# Patient Record
Sex: Female | Born: 1965
Health system: Southern US, Community
[De-identification: ages and names within clinical notes are randomized; demographics above are authoritative.]

## PROBLEM LIST (undated history)

## (undated) ENCOUNTER — Ambulatory Visit: Payer: MEDICARE

## (undated) ENCOUNTER — Encounter

## (undated) ENCOUNTER — Ambulatory Visit

## (undated) ENCOUNTER — Telehealth

## (undated) ENCOUNTER — Encounter: Payer: MEDICARE | Attending: Internal Medicine | Primary: Internal Medicine

## (undated) ENCOUNTER — Ambulatory Visit: Attending: Otolaryngology | Primary: Otolaryngology

## (undated) DIAGNOSIS — M545 Low back pain, unspecified: Secondary | ICD-10-CM

## (undated) DIAGNOSIS — M1711 Unilateral primary osteoarthritis, right knee: Secondary | ICD-10-CM

## (undated) DIAGNOSIS — G5600 Carpal tunnel syndrome, unspecified upper limb: Secondary | ICD-10-CM

## (undated) DIAGNOSIS — B351 Tinea unguium: Secondary | ICD-10-CM

## (undated) DIAGNOSIS — I493 Ventricular premature depolarization: Secondary | ICD-10-CM

## (undated) DIAGNOSIS — Z1211 Encounter for screening for malignant neoplasm of colon: Secondary | ICD-10-CM

## (undated) DIAGNOSIS — F32A Depression, unspecified: Secondary | ICD-10-CM

## (undated) DIAGNOSIS — Z87898 Personal history of other specified conditions: Secondary | ICD-10-CM

## (undated) DIAGNOSIS — R569 Unspecified convulsions: Secondary | ICD-10-CM

## (undated) DIAGNOSIS — N3281 Overactive bladder: Secondary | ICD-10-CM

## (undated) DIAGNOSIS — J45909 Unspecified asthma, uncomplicated: Secondary | ICD-10-CM

## (undated) DIAGNOSIS — J309 Allergic rhinitis, unspecified: Secondary | ICD-10-CM

## (undated) DIAGNOSIS — F419 Anxiety disorder, unspecified: Secondary | ICD-10-CM

## (undated) DIAGNOSIS — K8020 Calculus of gallbladder without cholecystitis without obstruction: Secondary | ICD-10-CM

## (undated) DIAGNOSIS — E119 Type 2 diabetes mellitus without complications: Secondary | ICD-10-CM

## (undated) DIAGNOSIS — K219 Gastro-esophageal reflux disease without esophagitis: Secondary | ICD-10-CM

## (undated) DIAGNOSIS — G8929 Other chronic pain: Secondary | ICD-10-CM

## (undated) DIAGNOSIS — F319 Bipolar disorder, unspecified: Secondary | ICD-10-CM

## (undated) DIAGNOSIS — Z9989 Dependence on other enabling machines and devices: Secondary | ICD-10-CM

## (undated) DIAGNOSIS — I10 Essential (primary) hypertension: Secondary | ICD-10-CM

## (undated) DIAGNOSIS — S91311A Laceration without foreign body, right foot, initial encounter: Secondary | ICD-10-CM

## (undated) DIAGNOSIS — R7303 Prediabetes: Secondary | ICD-10-CM

## (undated) DIAGNOSIS — G4733 Obstructive sleep apnea (adult) (pediatric): Secondary | ICD-10-CM

## (undated) DIAGNOSIS — F329 Major depressive disorder, single episode, unspecified: Secondary | ICD-10-CM

## (undated) HISTORY — DX: Low back pain, unspecified: M54.50

## (undated) HISTORY — DX: Calculus of gallbladder without cholecystitis without obstruction: K80.20

## (undated) HISTORY — PX: OTHER SURGICAL HISTORY: SHX169

## (undated) HISTORY — DX: Laceration without foreign body, right foot, initial encounter: S91.311A

## (undated) HISTORY — DX: Obstructive sleep apnea (adult) (pediatric): G47.33

## (undated) HISTORY — PX: CARPAL TUNNEL RELEASE: SHX101

## (undated) HISTORY — DX: Unspecified convulsions: R56.9

## (undated) HISTORY — DX: Tinea unguium: B35.1

## (undated) HISTORY — DX: Personal history of other specified conditions: Z87.898

## (undated) HISTORY — DX: Other chronic pain: G89.29

## (undated) HISTORY — DX: Carpal tunnel syndrome, unspecified upper limb: G56.00

## (undated) HISTORY — DX: Ventricular premature depolarization: I49.3

## (undated) HISTORY — DX: Overactive bladder: N32.81

## (undated) HISTORY — DX: Low back pain: M54.5

## (undated) HISTORY — DX: Unilateral primary osteoarthritis, right knee: M17.11

## (undated) HISTORY — DX: Encounter for screening for malignant neoplasm of colon: Z12.11

## (undated) HISTORY — PX: ABDOMINAL HYSTERECTOMY: SHX81

## (undated) HISTORY — PX: LEEP: SHX91

## (undated) HISTORY — PX: FOOT SURGERY: SHX648

## (undated) HISTORY — PX: TUBAL LIGATION: SHX77

## (undated) HISTORY — DX: Obstructive sleep apnea (adult) (pediatric): Z99.89

## (undated) HISTORY — PX: DILATION AND CURETTAGE OF UTERUS: SHX78

---

## 2004-03-27 ENCOUNTER — Other Ambulatory Visit: Payer: Self-pay

## 2005-07-28 ENCOUNTER — Ambulatory Visit: Payer: Self-pay | Admitting: Unknown Physician Specialty

## 2005-08-05 ENCOUNTER — Ambulatory Visit: Payer: Self-pay | Admitting: Unknown Physician Specialty

## 2005-08-15 ENCOUNTER — Emergency Department: Payer: Self-pay | Admitting: Emergency Medicine

## 2005-12-07 ENCOUNTER — Emergency Department: Payer: Self-pay | Admitting: Emergency Medicine

## 2006-02-19 ENCOUNTER — Emergency Department: Payer: Self-pay | Admitting: Unknown Physician Specialty

## 2006-05-01 ENCOUNTER — Ambulatory Visit (HOSPITAL_COMMUNITY): Admission: RE | Admit: 2006-05-01 | Discharge: 2006-05-01 | Payer: Self-pay | Admitting: Gastroenterology

## 2006-05-30 ENCOUNTER — Ambulatory Visit: Payer: Self-pay

## 2006-10-30 ENCOUNTER — Emergency Department: Payer: Self-pay | Admitting: General Practice

## 2007-05-21 ENCOUNTER — Ambulatory Visit: Payer: Self-pay | Admitting: Emergency Medicine

## 2007-05-23 ENCOUNTER — Emergency Department: Payer: Self-pay | Admitting: Emergency Medicine

## 2007-07-26 ENCOUNTER — Ambulatory Visit: Payer: Self-pay | Admitting: Unknown Physician Specialty

## 2007-08-03 ENCOUNTER — Ambulatory Visit: Payer: Self-pay | Admitting: Unknown Physician Specialty

## 2007-11-18 ENCOUNTER — Ambulatory Visit: Payer: Self-pay | Admitting: Family Medicine

## 2008-06-05 ENCOUNTER — Emergency Department: Payer: Self-pay | Admitting: Emergency Medicine

## 2008-08-23 ENCOUNTER — Emergency Department: Payer: Self-pay | Admitting: Emergency Medicine

## 2008-08-29 ENCOUNTER — Emergency Department: Payer: Self-pay | Admitting: Emergency Medicine

## 2009-03-08 ENCOUNTER — Observation Stay: Payer: Self-pay | Admitting: Obstetrics and Gynecology

## 2009-04-25 ENCOUNTER — Emergency Department: Payer: Self-pay | Admitting: Emergency Medicine

## 2009-05-09 ENCOUNTER — Ambulatory Visit: Payer: Self-pay | Admitting: Family Medicine

## 2009-07-20 ENCOUNTER — Emergency Department: Payer: Self-pay | Admitting: Emergency Medicine

## 2009-09-10 ENCOUNTER — Emergency Department: Payer: Self-pay | Admitting: Emergency Medicine

## 2009-12-10 ENCOUNTER — Ambulatory Visit: Payer: Self-pay | Admitting: Internal Medicine

## 2010-02-11 ENCOUNTER — Emergency Department: Payer: Self-pay | Admitting: Emergency Medicine

## 2010-03-27 ENCOUNTER — Ambulatory Visit: Payer: Self-pay | Admitting: Orthopedic Surgery

## 2010-03-28 ENCOUNTER — Ambulatory Visit: Payer: Self-pay | Admitting: Orthopedic Surgery

## 2010-04-09 ENCOUNTER — Emergency Department: Payer: Self-pay | Admitting: Unknown Physician Specialty

## 2010-04-22 ENCOUNTER — Emergency Department: Payer: Self-pay | Admitting: Emergency Medicine

## 2010-05-03 ENCOUNTER — Emergency Department: Payer: Self-pay | Admitting: Emergency Medicine

## 2010-05-12 ENCOUNTER — Ambulatory Visit: Payer: Self-pay | Admitting: Family Medicine

## 2010-06-12 ENCOUNTER — Emergency Department: Payer: Self-pay | Admitting: Emergency Medicine

## 2010-07-20 ENCOUNTER — Emergency Department: Payer: Self-pay | Admitting: Emergency Medicine

## 2010-09-09 ENCOUNTER — Emergency Department: Payer: Self-pay | Admitting: Emergency Medicine

## 2010-10-01 ENCOUNTER — Emergency Department: Payer: Self-pay | Admitting: Emergency Medicine

## 2010-10-10 ENCOUNTER — Ambulatory Visit: Payer: Self-pay | Admitting: Orthopedic Surgery

## 2010-11-05 ENCOUNTER — Ambulatory Visit: Payer: Self-pay | Admitting: Pain Medicine

## 2010-11-27 ENCOUNTER — Ambulatory Visit: Payer: Self-pay | Admitting: Pain Medicine

## 2010-12-17 ENCOUNTER — Ambulatory Visit: Payer: Self-pay | Admitting: Family Medicine

## 2010-12-23 ENCOUNTER — Emergency Department: Payer: Self-pay | Admitting: Emergency Medicine

## 2010-12-26 ENCOUNTER — Ambulatory Visit: Payer: Self-pay | Admitting: Pain Medicine

## 2011-01-08 ENCOUNTER — Emergency Department: Payer: Self-pay | Admitting: Emergency Medicine

## 2011-01-20 ENCOUNTER — Ambulatory Visit: Payer: Self-pay | Admitting: Pain Medicine

## 2011-01-22 ENCOUNTER — Emergency Department: Payer: Self-pay | Admitting: Emergency Medicine

## 2011-02-04 ENCOUNTER — Ambulatory Visit: Payer: Self-pay | Admitting: Family Medicine

## 2011-02-13 ENCOUNTER — Emergency Department: Payer: Self-pay | Admitting: Emergency Medicine

## 2011-04-14 ENCOUNTER — Emergency Department: Payer: Self-pay | Admitting: Emergency Medicine

## 2011-04-15 ENCOUNTER — Ambulatory Visit: Payer: Self-pay | Admitting: Unknown Physician Specialty

## 2011-04-18 ENCOUNTER — Ambulatory Visit: Payer: Self-pay | Admitting: Unknown Physician Specialty

## 2011-04-22 LAB — PATHOLOGY REPORT

## 2011-06-24 ENCOUNTER — Emergency Department: Payer: Self-pay | Admitting: Emergency Medicine

## 2011-07-07 ENCOUNTER — Ambulatory Visit: Payer: Self-pay | Admitting: Unknown Physician Specialty

## 2011-07-08 HISTORY — PX: ABDOMINAL HYSTERECTOMY: SUR658

## 2011-07-17 ENCOUNTER — Ambulatory Visit: Payer: Self-pay | Admitting: Unknown Physician Specialty

## 2011-07-21 LAB — PATHOLOGY REPORT

## 2011-08-05 ENCOUNTER — Emergency Department: Payer: Self-pay | Admitting: Emergency Medicine

## 2011-08-15 ENCOUNTER — Emergency Department: Payer: Self-pay | Admitting: Emergency Medicine

## 2011-08-25 ENCOUNTER — Ambulatory Visit: Payer: Self-pay

## 2011-08-25 ENCOUNTER — Emergency Department: Payer: Self-pay | Admitting: Emergency Medicine

## 2011-08-26 ENCOUNTER — Emergency Department: Payer: Self-pay | Admitting: Emergency Medicine

## 2011-11-01 ENCOUNTER — Emergency Department: Payer: Self-pay | Admitting: Emergency Medicine

## 2012-01-03 ENCOUNTER — Ambulatory Visit: Payer: Self-pay | Admitting: Family Medicine

## 2012-01-03 LAB — RAPID INFLUENZA A&B ANTIGENS

## 2012-01-30 ENCOUNTER — Ambulatory Visit: Payer: Self-pay | Admitting: Internal Medicine

## 2012-03-18 ENCOUNTER — Emergency Department: Payer: Self-pay | Admitting: Emergency Medicine

## 2012-05-15 ENCOUNTER — Emergency Department: Payer: Self-pay | Admitting: Emergency Medicine

## 2012-06-09 ENCOUNTER — Ambulatory Visit: Payer: Self-pay | Admitting: Family Medicine

## 2012-08-22 ENCOUNTER — Emergency Department: Payer: Self-pay | Admitting: Emergency Medicine

## 2012-08-22 LAB — CBC
HCT: 36.3 % (ref 35.0–47.0)
HGB: 12.7 g/dL (ref 12.0–16.0)
MCV: 82 fL (ref 80–100)
Platelet: 194 10*3/uL (ref 150–440)
RBC: 4.43 10*6/uL (ref 3.80–5.20)
RDW: 14.2 % (ref 11.5–14.5)
WBC: 6 10*3/uL (ref 3.6–11.0)

## 2012-08-22 LAB — COMPREHENSIVE METABOLIC PANEL
Albumin: 3.9 g/dL (ref 3.4–5.0)
Alkaline Phosphatase: 87 U/L (ref 50–136)
Anion Gap: 5 — ABNORMAL LOW (ref 7–16)
Creatinine: 0.83 mg/dL (ref 0.60–1.30)
Osmolality: 276 (ref 275–301)
Potassium: 3.6 mmol/L (ref 3.5–5.1)
SGPT (ALT): 25 U/L (ref 12–78)
Sodium: 140 mmol/L (ref 136–145)
Total Protein: 7.5 g/dL (ref 6.4–8.2)

## 2012-08-22 LAB — CK TOTAL AND CKMB (NOT AT ARMC): CK, Total: 153 U/L (ref 21–215)

## 2013-03-02 ENCOUNTER — Ambulatory Visit: Payer: Self-pay

## 2013-03-29 ENCOUNTER — Emergency Department: Payer: Self-pay | Admitting: Unknown Physician Specialty

## 2013-04-04 ENCOUNTER — Emergency Department: Payer: Self-pay | Admitting: Emergency Medicine

## 2013-04-04 ENCOUNTER — Ambulatory Visit: Payer: Self-pay

## 2013-04-04 LAB — URINALYSIS, COMPLETE
Blood: NEGATIVE
Glucose,UR: NEGATIVE mg/dL (ref 0–75)
Leukocyte Esterase: NEGATIVE
Nitrite: NEGATIVE
Ph: 7 (ref 4.5–8.0)
RBC,UR: NONE SEEN /HPF (ref 0–5)
Specific Gravity: 1.011 (ref 1.003–1.030)
Squamous Epithelial: 1
WBC UR: <1 /HPF (ref 0–5)

## 2013-04-04 LAB — COMPREHENSIVE METABOLIC PANEL
Anion Gap: 6 — ABNORMAL LOW (ref 7–16)
BUN: 9 mg/dL (ref 7–18)
Calcium, Total: 8.9 mg/dL (ref 8.5–10.1)
Chloride: 104 mmol/L (ref 98–107)
Creatinine: 0.87 mg/dL (ref 0.60–1.30)
EGFR (African American): >60
Glucose: 97 mg/dL (ref 65–99)
Osmolality: 272 (ref 275–301)
SGPT (ALT): 34 U/L (ref 12–78)
Total Protein: 7.4 g/dL (ref 6.4–8.2)

## 2013-04-04 LAB — CBC WITH DIFFERENTIAL/PLATELET
Basophil #: 0 10*3/uL (ref 0.0–0.1)
Eosinophil #: 0 10*3/uL (ref 0.0–0.7)
Eosinophil %: 0.4 %
HCT: 35.1 % (ref 35.0–47.0)
HGB: 12.6 g/dL (ref 12.0–16.0)
Lymphocyte #: 1.2 10*3/uL (ref 1.0–3.6)
MCH: 28.6 pg (ref 26.0–34.0)
MCHC: 35.8 g/dL (ref 32.0–36.0)
MCV: 80 fL (ref 80–100)
Monocyte #: 0.4 x10 3/mm (ref 0.2–0.9)
Neutrophil %: 82.8 %
Platelet: 202 10*3/uL (ref 150–440)
RBC: 4.39 10*6/uL (ref 3.80–5.20)
RDW: 14.8 % — ABNORMAL HIGH (ref 11.5–14.5)

## 2013-04-04 LAB — DRUG SCREEN, URINE
Amphetamines, Ur Screen: NEGATIVE (ref ?–1000)
Benzodiazepine, Ur Scrn: NEGATIVE (ref ?–200)
Cannabinoid 50 Ng, Ur ~~LOC~~: NEGATIVE (ref ?–50)
Cocaine Metabolite,Ur ~~LOC~~: NEGATIVE (ref ?–300)
MDMA (Ecstasy)Ur Screen: NEGATIVE (ref ?–500)
Phencyclidine (PCP) Ur S: NEGATIVE (ref ?–25)
Tricyclic, Ur Screen: NEGATIVE (ref ?–1000)

## 2013-04-04 LAB — CK TOTAL AND CKMB (NOT AT ARMC)
CK, Total: 102 U/L (ref 21–215)
CK-MB: <0.5 ng/mL — ABNORMAL LOW (ref 0.5–3.6)

## 2013-04-04 LAB — TROPONIN I: Troponin-I: <0.02 ng/mL

## 2014-05-02 ENCOUNTER — Ambulatory Visit: Payer: Self-pay | Admitting: Family Medicine

## 2014-05-02 LAB — URINALYSIS, COMPLETE
Bilirubin,UR: NEGATIVE
Blood: NEGATIVE
Glucose,UR: NEGATIVE mg/dL (ref 0–75)
Ketone: NEGATIVE
LEUKOCYTE ESTERASE: NEGATIVE
NITRITE: NEGATIVE
PH: 7.5 (ref 4.5–8.0)
Protein: NEGATIVE
Specific Gravity: 1.01 (ref 1.003–1.030)

## 2014-05-05 LAB — URINE CULTURE

## 2014-06-28 ENCOUNTER — Ambulatory Visit: Payer: Self-pay | Admitting: Family Medicine

## 2014-07-16 ENCOUNTER — Ambulatory Visit: Payer: Self-pay | Admitting: Family Medicine

## 2014-09-09 ENCOUNTER — Ambulatory Visit: Payer: Self-pay | Admitting: Emergency Medicine

## 2014-09-09 LAB — RAPID INFLUENZA A&B ANTIGENS (ARMC ONLY)

## 2014-09-17 ENCOUNTER — Emergency Department: Payer: Self-pay | Admitting: Emergency Medicine

## 2014-09-17 LAB — URINALYSIS, COMPLETE
BILIRUBIN, UR: NEGATIVE
BLOOD: NEGATIVE
GLUCOSE, UR: NEGATIVE mg/dL (ref 0–75)
Ketone: NEGATIVE
Leukocyte Esterase: NEGATIVE
Nitrite: NEGATIVE
Ph: 6 (ref 4.5–8.0)
Protein: NEGATIVE
RBC,UR: <1 /HPF (ref 0–5)
SPECIFIC GRAVITY: 1.003 (ref 1.003–1.030)
WBC UR: <1 /HPF (ref 0–5)

## 2014-09-17 LAB — CBC WITH DIFFERENTIAL/PLATELET
Basophil #: 0.1 10*3/uL (ref 0.0–0.1)
Basophil %: 0.7 %
EOS ABS: 0.5 10*3/uL (ref 0.0–0.7)
Eosinophil %: 3.8 %
HCT: 39 % (ref 35.0–47.0)
HGB: 13 g/dL (ref 12.0–16.0)
LYMPHS ABS: 3 10*3/uL (ref 1.0–3.6)
Lymphocyte %: 21.4 %
MCH: 27.6 pg (ref 26.0–34.0)
MCHC: 33.4 g/dL (ref 32.0–36.0)
MCV: 83 fL (ref 80–100)
MONOS PCT: 9.5 %
Monocyte #: 1.3 x10 3/mm — ABNORMAL HIGH (ref 0.2–0.9)
NEUTROS ABS: 9 10*3/uL — AB (ref 1.4–6.5)
NEUTROS PCT: 64.6 %
Platelet: 265 10*3/uL (ref 150–440)
RBC: 4.72 10*6/uL (ref 3.80–5.20)
RDW: 14.3 % (ref 11.5–14.5)
WBC: 13.9 10*3/uL — ABNORMAL HIGH (ref 3.6–11.0)

## 2014-09-17 LAB — BASIC METABOLIC PANEL
Anion Gap: 7 (ref 7–16)
BUN: 9 mg/dL (ref 7–18)
CO2: 28 mmol/L (ref 21–32)
CREATININE: 0.94 mg/dL (ref 0.60–1.30)
Calcium, Total: 9.2 mg/dL (ref 8.5–10.1)
Chloride: 103 mmol/L (ref 98–107)
EGFR (African American): >60
Glucose: 105 mg/dL — ABNORMAL HIGH (ref 65–99)
Osmolality: 275 (ref 275–301)
POTASSIUM: 4 mmol/L (ref 3.5–5.1)
Sodium: 138 mmol/L (ref 136–145)

## 2014-11-07 DIAGNOSIS — M5441 Lumbago with sciatica, right side: Secondary | ICD-10-CM | POA: Diagnosis not present

## 2014-11-07 DIAGNOSIS — M545 Low back pain: Secondary | ICD-10-CM | POA: Diagnosis not present

## 2014-11-10 DIAGNOSIS — M5432 Sciatica, left side: Secondary | ICD-10-CM | POA: Diagnosis not present

## 2014-11-10 DIAGNOSIS — M545 Low back pain: Secondary | ICD-10-CM | POA: Diagnosis not present

## 2014-11-10 DIAGNOSIS — M6281 Muscle weakness (generalized): Secondary | ICD-10-CM | POA: Diagnosis not present

## 2014-11-10 DIAGNOSIS — M5431 Sciatica, right side: Secondary | ICD-10-CM | POA: Diagnosis not present

## 2014-11-15 DIAGNOSIS — M5432 Sciatica, left side: Secondary | ICD-10-CM | POA: Diagnosis not present

## 2014-11-15 DIAGNOSIS — M5431 Sciatica, right side: Secondary | ICD-10-CM | POA: Diagnosis not present

## 2014-11-15 DIAGNOSIS — M6281 Muscle weakness (generalized): Secondary | ICD-10-CM | POA: Diagnosis not present

## 2014-11-15 DIAGNOSIS — M545 Low back pain: Secondary | ICD-10-CM | POA: Diagnosis not present

## 2014-11-17 DIAGNOSIS — M545 Low back pain: Secondary | ICD-10-CM | POA: Diagnosis not present

## 2014-11-17 DIAGNOSIS — M5431 Sciatica, right side: Secondary | ICD-10-CM | POA: Diagnosis not present

## 2014-11-17 DIAGNOSIS — M6281 Muscle weakness (generalized): Secondary | ICD-10-CM | POA: Diagnosis not present

## 2014-11-17 DIAGNOSIS — M5432 Sciatica, left side: Secondary | ICD-10-CM | POA: Diagnosis not present

## 2014-11-22 DIAGNOSIS — M6281 Muscle weakness (generalized): Secondary | ICD-10-CM | POA: Diagnosis not present

## 2014-11-22 DIAGNOSIS — M545 Low back pain: Secondary | ICD-10-CM | POA: Diagnosis not present

## 2014-11-22 DIAGNOSIS — M5432 Sciatica, left side: Secondary | ICD-10-CM | POA: Diagnosis not present

## 2014-11-22 DIAGNOSIS — M5431 Sciatica, right side: Secondary | ICD-10-CM | POA: Diagnosis not present

## 2014-11-23 DIAGNOSIS — M5431 Sciatica, right side: Secondary | ICD-10-CM | POA: Diagnosis not present

## 2014-11-23 DIAGNOSIS — M6281 Muscle weakness (generalized): Secondary | ICD-10-CM | POA: Diagnosis not present

## 2014-11-23 DIAGNOSIS — M5432 Sciatica, left side: Secondary | ICD-10-CM | POA: Diagnosis not present

## 2014-11-23 DIAGNOSIS — M545 Low back pain: Secondary | ICD-10-CM | POA: Diagnosis not present

## 2014-11-27 DIAGNOSIS — M5431 Sciatica, right side: Secondary | ICD-10-CM | POA: Diagnosis not present

## 2014-11-27 DIAGNOSIS — M6281 Muscle weakness (generalized): Secondary | ICD-10-CM | POA: Diagnosis not present

## 2014-11-27 DIAGNOSIS — M545 Low back pain: Secondary | ICD-10-CM | POA: Diagnosis not present

## 2014-11-27 DIAGNOSIS — M5432 Sciatica, left side: Secondary | ICD-10-CM | POA: Diagnosis not present

## 2014-11-28 ENCOUNTER — Ambulatory Visit: Payer: Self-pay | Admitting: Family Medicine

## 2014-11-28 DIAGNOSIS — Z1231 Encounter for screening mammogram for malignant neoplasm of breast: Secondary | ICD-10-CM | POA: Diagnosis not present

## 2014-12-01 DIAGNOSIS — M5431 Sciatica, right side: Secondary | ICD-10-CM | POA: Diagnosis not present

## 2014-12-01 DIAGNOSIS — M5432 Sciatica, left side: Secondary | ICD-10-CM | POA: Diagnosis not present

## 2014-12-01 DIAGNOSIS — M545 Low back pain: Secondary | ICD-10-CM | POA: Diagnosis not present

## 2014-12-01 DIAGNOSIS — M6281 Muscle weakness (generalized): Secondary | ICD-10-CM | POA: Diagnosis not present

## 2014-12-04 DIAGNOSIS — M5431 Sciatica, right side: Secondary | ICD-10-CM | POA: Diagnosis not present

## 2014-12-04 DIAGNOSIS — M545 Low back pain: Secondary | ICD-10-CM | POA: Diagnosis not present

## 2014-12-04 DIAGNOSIS — M6281 Muscle weakness (generalized): Secondary | ICD-10-CM | POA: Diagnosis not present

## 2014-12-04 DIAGNOSIS — M5432 Sciatica, left side: Secondary | ICD-10-CM | POA: Diagnosis not present

## 2014-12-05 DIAGNOSIS — M5441 Lumbago with sciatica, right side: Secondary | ICD-10-CM | POA: Diagnosis not present

## 2014-12-05 DIAGNOSIS — I10 Essential (primary) hypertension: Secondary | ICD-10-CM | POA: Diagnosis not present

## 2014-12-06 DIAGNOSIS — E669 Obesity, unspecified: Secondary | ICD-10-CM | POA: Diagnosis not present

## 2014-12-13 DIAGNOSIS — M6281 Muscle weakness (generalized): Secondary | ICD-10-CM | POA: Diagnosis not present

## 2014-12-13 DIAGNOSIS — M5431 Sciatica, right side: Secondary | ICD-10-CM | POA: Diagnosis not present

## 2014-12-13 DIAGNOSIS — M545 Low back pain: Secondary | ICD-10-CM | POA: Diagnosis not present

## 2014-12-13 DIAGNOSIS — M5432 Sciatica, left side: Secondary | ICD-10-CM | POA: Diagnosis not present

## 2014-12-18 DIAGNOSIS — M6281 Muscle weakness (generalized): Secondary | ICD-10-CM | POA: Diagnosis not present

## 2014-12-18 DIAGNOSIS — M545 Low back pain: Secondary | ICD-10-CM | POA: Diagnosis not present

## 2014-12-18 DIAGNOSIS — M5432 Sciatica, left side: Secondary | ICD-10-CM | POA: Diagnosis not present

## 2014-12-18 DIAGNOSIS — M5431 Sciatica, right side: Secondary | ICD-10-CM | POA: Diagnosis not present

## 2014-12-25 DIAGNOSIS — M5431 Sciatica, right side: Secondary | ICD-10-CM | POA: Diagnosis not present

## 2014-12-25 DIAGNOSIS — M545 Low back pain: Secondary | ICD-10-CM | POA: Diagnosis not present

## 2014-12-25 DIAGNOSIS — M5432 Sciatica, left side: Secondary | ICD-10-CM | POA: Diagnosis not present

## 2014-12-25 DIAGNOSIS — M6281 Muscle weakness (generalized): Secondary | ICD-10-CM | POA: Diagnosis not present

## 2014-12-27 DIAGNOSIS — R042 Hemoptysis: Secondary | ICD-10-CM | POA: Diagnosis not present

## 2014-12-27 DIAGNOSIS — K219 Gastro-esophageal reflux disease without esophagitis: Secondary | ICD-10-CM | POA: Diagnosis not present

## 2015-01-15 DIAGNOSIS — R05 Cough: Secondary | ICD-10-CM | POA: Diagnosis not present

## 2015-01-15 DIAGNOSIS — I10 Essential (primary) hypertension: Secondary | ICD-10-CM | POA: Diagnosis not present

## 2015-01-16 ENCOUNTER — Ambulatory Visit
Admit: 2015-01-16 | Disposition: A | Discharge status: Home / Self Care | Payer: Self-pay | Attending: Family Medicine | Admitting: Family Medicine

## 2015-01-16 DIAGNOSIS — F419 Anxiety disorder, unspecified: Secondary | ICD-10-CM | POA: Diagnosis not present

## 2015-01-16 DIAGNOSIS — I10 Essential (primary) hypertension: Secondary | ICD-10-CM | POA: Diagnosis not present

## 2015-01-16 DIAGNOSIS — F319 Bipolar disorder, unspecified: Secondary | ICD-10-CM | POA: Diagnosis not present

## 2015-01-16 DIAGNOSIS — J40 Bronchitis, not specified as acute or chronic: Secondary | ICD-10-CM | POA: Diagnosis not present

## 2015-01-16 DIAGNOSIS — R05 Cough: Secondary | ICD-10-CM | POA: Diagnosis not present

## 2015-01-16 DIAGNOSIS — R079 Chest pain, unspecified: Secondary | ICD-10-CM | POA: Diagnosis not present

## 2015-01-17 DIAGNOSIS — F259 Schizoaffective disorder, unspecified: Secondary | ICD-10-CM | POA: Diagnosis not present

## 2015-01-17 DIAGNOSIS — E669 Obesity, unspecified: Secondary | ICD-10-CM | POA: Diagnosis not present

## 2015-01-17 DIAGNOSIS — R05 Cough: Secondary | ICD-10-CM | POA: Diagnosis not present

## 2015-02-07 ENCOUNTER — Ambulatory Visit
Admission: EM | Admit: 2015-02-07 | Discharge: 2015-02-07 | Disposition: A | Discharge status: Home / Self Care | Payer: Commercial Managed Care - HMO | Attending: Family Medicine | Admitting: Family Medicine

## 2015-02-07 ENCOUNTER — Ambulatory Visit: Payer: Commercial Managed Care - HMO

## 2015-02-07 DIAGNOSIS — M545 Low back pain: Secondary | ICD-10-CM | POA: Insufficient documentation

## 2015-02-07 DIAGNOSIS — M5417 Radiculopathy, lumbosacral region: Secondary | ICD-10-CM | POA: Diagnosis not present

## 2015-02-07 DIAGNOSIS — K219 Gastro-esophageal reflux disease without esophagitis: Secondary | ICD-10-CM | POA: Diagnosis not present

## 2015-02-07 DIAGNOSIS — F329 Major depressive disorder, single episode, unspecified: Secondary | ICD-10-CM | POA: Diagnosis not present

## 2015-02-07 DIAGNOSIS — M5136 Other intervertebral disc degeneration, lumbar region: Secondary | ICD-10-CM | POA: Diagnosis not present

## 2015-02-07 DIAGNOSIS — M5416 Radiculopathy, lumbar region: Secondary | ICD-10-CM

## 2015-02-07 DIAGNOSIS — I10 Essential (primary) hypertension: Secondary | ICD-10-CM | POA: Insufficient documentation

## 2015-02-07 DIAGNOSIS — F319 Bipolar disorder, unspecified: Secondary | ICD-10-CM | POA: Insufficient documentation

## 2015-02-07 HISTORY — DX: Essential (primary) hypertension: I10

## 2015-02-07 HISTORY — DX: Major depressive disorder, single episode, unspecified: F32.9

## 2015-02-07 HISTORY — DX: Bipolar disorder, unspecified: F31.9

## 2015-02-07 HISTORY — DX: Gastro-esophageal reflux disease without esophagitis: K21.9

## 2015-02-07 HISTORY — DX: Allergic rhinitis, unspecified: J30.9

## 2015-02-07 HISTORY — DX: Depression, unspecified: F32.A

## 2015-02-07 MED ORDER — MELOXICAM 15 MG PO TABS: 15.0000 mg | ORAL_TABLET | Freq: Every day | ORAL | Status: DC

## 2015-02-07 MED ORDER — KETOROLAC TROMETHAMINE 60 MG/2ML IM SOLN
60.0000 mg | Freq: Once | INTRAMUSCULAR | Status: AC
  Administered 2015-02-07: 60 mg via INTRAMUSCULAR

## 2015-02-07 MED ORDER — ORPHENADRINE CITRATE ER 100 MG PO TB12: 100.0000 mg | ORAL_TABLET | Freq: Two times a day (BID) | ORAL | Status: DC

## 2015-02-07 NOTE — ED Notes (Signed)
Patient states that this started 5 months ago. She states that pain is in her lower back and radiates down her right leg. She states that the pain is sharp and constant. She states that pain is worse with standing. She states that she saw physical therapy and that they released her. She states that then the pain returned.

## 2015-02-07 NOTE — ED Provider Notes (Addendum)
CSN: [id number]     Arrival date & time 02/07/15  0915 History   First MD Initiated Contact with Patient 02/07/15 [phone]     Chief Complaint  Patient presents with  . Back Pain    Started 5 months ago   (Consider location/radiation/quality/duration/timing/severity/associated sxs/prior Treatment) Patient is a 49 y.o. female presenting with back pain.  Back Pain Associated symptoms: abdominal pain   Associated symptoms: no numbness and no weakness    Presents with 5-6 months H/O radiating LBP . Feels radiation into her right anterior thigh to the knee level. States it hurts "all the time". Sneezing or coughing increases the pain. Nothing adequately alleviates. Saw her provider Heather Sweeney who Rx gabapentin which is ineffective. No incontinence. No H/O injury.  Past Medical History  Diagnosis Date  . Bipolar disorder   . Depression   . Hypertension   . GERD (gastroesophageal reflux disease)   . Allergic rhinitis    Past Surgical History  Procedure Laterality Date  . Abdominal hysterectomy  07/08/2011   Family History  Problem Relation Age of Onset  . Cancer Father   . Hypertension Father   . Heart failure Mother   . Hypertension Mother    History  Substance Use Topics  . Smoking status: Never Smoker   . Smokeless tobacco: Not on file  . Alcohol Use: No   OB History    No data available     Review of Systems  Constitutional: Positive for activity change.  HENT: Negative.   Eyes: Negative.   Respiratory: Negative.   Cardiovascular: Positive for leg swelling.  Gastrointestinal: Positive for abdominal pain.  Endocrine: Negative.   Genitourinary: Negative for flank pain and difficulty urinating.  Musculoskeletal: Positive for back pain. Negative for myalgias, joint swelling, arthralgias, gait problem, neck pain and neck stiffness.  Skin: Negative.   Allergic/Immunologic: Negative.   Neurological: Negative for weakness and numbness.  Hematological: Negative.    Psychiatric/Behavioral: Negative.   All other systems reviewed and are negative.   Allergies  Review of patient's allergies indicates no known allergies.  Home Medications   Prior to Admission medications   Medication Sig Start Date End Date Taking? Authorizing Provider  amLODipine (NORVASC) 10 MG tablet Take 10 mg by mouth daily.   Yes Historical Provider, MD  benzonatate (TESSALON) 100 MG capsule Take by mouth 3 (three) times daily as needed for cough.   Yes Historical Provider, MD  benztropine (COGENTIN) 1 MG tablet Take 1 mg by mouth 2 (two) times daily.   Yes Historical Provider, MD  fluPHENAZine (PROLIXIN) 2.5 MG tablet Take 2.5 mg by mouth daily.   Yes Historical Provider, MD  fluticasone (FLONASE) 50 MCG/ACT nasal spray Place 2 sprays into both nostrils daily.   Yes Historical Provider, MD  loratadine (CLARITIN) 10 MG tablet Take 10 mg by mouth daily.   Yes Historical Provider, MD  meloxicam (MOBIC) 15 MG tablet Take 1 tablet (15 mg total) by mouth daily. 02/07/15   [name], PA-C  orphenadrine (NORFLEX) 100 MG tablet Take 1 tablet (100 mg total) by mouth 2 (two) times daily. 02/07/15   [name], PA-C   BP 143/90 mmHg  Pulse 92  Temp(Src) 97.1 F (36.2 C) (Tympanic)  Resp 18  Ht 5\' 3"  (1.6 m)  Wt 277 lb (125.646 kg)  BMI 49.08 kg/m2  SpO2 96%  LMP  Physical Exam  Constitutional: She is oriented to person, place, and time. She appears well-developed and well-nourished.  HENT:  Head: Normocephalic and atraumatic.  Eyes: EOM are normal. Pupils are equal, round, and reactive to light.  Neck: Normal range of motion. Neck supple.  Cardiovascular: Normal rate, regular rhythm and normal heart sounds.   Pulmonary/Chest: Effort normal and breath sounds normal. No respiratory distress. She exhibits no tenderness.  Abdominal: Soft. Bowel sounds are normal.  Musculoskeletal: Normal range of motion.  Neurological: She is alert and oriented to person, place, and time.  She has normal reflexes. Coordination normal.  Skin: Skin is warm and dry.  Psychiatric: Her behavior is normal. Judgment and thought content normal.  Nursing note and vitals reviewed.  [name] chaperoned. Level pelvis in stance. Lateral flexion to left increases pain. FF also causes pain. Able to toe and heel walk. DTR depressed bilaterally. Sensation intact bilaterally. SLR normal to 90 degrees. Tenderness to palpation along para lumbar muscles ED Course  Procedures (including critical care time) Labs Review Labs Reviewed - No data to display  Imaging Review No results found.   MDM   1. Lumbar back pain with radiculopathy affecting right lower extremity    I had a long discussion with the patient regarding her back pain. Is apparently is muscular and involves a radicular pattern but she has no loss of function and sensation is intact with a normal SLR. I have decided to treat her conservatively at the present time with anti-inflammatories and muscle relaxants and core strengthening exercises. Further physical therapy may be of benefit if these measures fail. Return to the care of her primary care physician or she may return here at any time.    [name], PA-C 02/07/15 1224  ketorolac (TORADOL) injection 60 mg 60 mg Intramuscular Given [name], [name], PA-C 03/07/15 [name], [name] 03/07/15 2038

## 2015-03-07 DIAGNOSIS — Z1329 Encounter for screening for other suspected endocrine disorder: Secondary | ICD-10-CM | POA: Diagnosis not present

## 2015-03-07 DIAGNOSIS — R7301 Impaired fasting glucose: Secondary | ICD-10-CM | POA: Diagnosis not present

## 2015-03-07 DIAGNOSIS — I10 Essential (primary) hypertension: Secondary | ICD-10-CM | POA: Diagnosis not present

## 2015-03-07 DIAGNOSIS — E669 Obesity, unspecified: Secondary | ICD-10-CM | POA: Diagnosis not present

## 2015-03-07 DIAGNOSIS — R600 Localized edema: Secondary | ICD-10-CM | POA: Diagnosis not present

## 2015-03-07 DIAGNOSIS — M545 Low back pain: Secondary | ICD-10-CM | POA: Diagnosis not present

## 2015-03-07 DIAGNOSIS — Z1389 Encounter for screening for other disorder: Secondary | ICD-10-CM | POA: Diagnosis not present

## 2015-03-07 DIAGNOSIS — R011 Cardiac murmur, unspecified: Secondary | ICD-10-CM | POA: Diagnosis not present

## 2015-03-07 DIAGNOSIS — F319 Bipolar disorder, unspecified: Secondary | ICD-10-CM | POA: Diagnosis not present

## 2015-03-20 ENCOUNTER — Other Ambulatory Visit: Payer: Self-pay | Admitting: Family Medicine

## 2015-03-20 DIAGNOSIS — M545 Low back pain: Secondary | ICD-10-CM

## 2015-03-24 ENCOUNTER — Ambulatory Visit
Admission: RE | Admit: 2015-03-24 | Discharge: 2015-03-24 | Disposition: A | Discharge status: Home / Self Care | Payer: Commercial Managed Care - HMO | Source: Ambulatory Visit | Attending: Family Medicine | Admitting: Family Medicine

## 2015-03-24 DIAGNOSIS — M4806 Spinal stenosis, lumbar region: Secondary | ICD-10-CM | POA: Diagnosis not present

## 2015-03-24 DIAGNOSIS — M47816 Spondylosis without myelopathy or radiculopathy, lumbar region: Secondary | ICD-10-CM | POA: Diagnosis not present

## 2015-03-24 DIAGNOSIS — M545 Low back pain: Secondary | ICD-10-CM

## 2015-03-24 DIAGNOSIS — M5136 Other intervertebral disc degeneration, lumbar region: Secondary | ICD-10-CM | POA: Diagnosis not present

## 2015-03-27 ENCOUNTER — Ambulatory Visit: Payer: Commercial Managed Care - HMO

## 2015-05-03 DIAGNOSIS — M545 Low back pain: Secondary | ICD-10-CM | POA: Diagnosis not present

## 2015-05-03 DIAGNOSIS — M6281 Muscle weakness (generalized): Secondary | ICD-10-CM | POA: Diagnosis not present

## 2015-05-07 DIAGNOSIS — M6281 Muscle weakness (generalized): Secondary | ICD-10-CM | POA: Diagnosis not present

## 2015-05-07 DIAGNOSIS — M545 Low back pain: Secondary | ICD-10-CM | POA: Diagnosis not present

## 2015-05-22 DIAGNOSIS — F250 Schizoaffective disorder, bipolar type: Secondary | ICD-10-CM | POA: Diagnosis not present

## 2015-05-23 DIAGNOSIS — M545 Low back pain: Secondary | ICD-10-CM | POA: Diagnosis not present

## 2015-05-23 DIAGNOSIS — R262 Difficulty in walking, not elsewhere classified: Secondary | ICD-10-CM | POA: Diagnosis not present

## 2015-05-23 DIAGNOSIS — M6281 Muscle weakness (generalized): Secondary | ICD-10-CM | POA: Diagnosis not present

## 2015-08-03 DIAGNOSIS — H524 Presbyopia: Secondary | ICD-10-CM | POA: Diagnosis not present

## 2015-08-03 DIAGNOSIS — H5210 Myopia, unspecified eye: Secondary | ICD-10-CM | POA: Diagnosis not present

## 2015-08-13 DIAGNOSIS — Z23 Encounter for immunization: Secondary | ICD-10-CM | POA: Diagnosis not present

## 2015-08-13 DIAGNOSIS — R7309 Other abnormal glucose: Secondary | ICD-10-CM | POA: Diagnosis not present

## 2015-08-14 DIAGNOSIS — F250 Schizoaffective disorder, bipolar type: Secondary | ICD-10-CM | POA: Diagnosis not present

## 2015-08-22 DIAGNOSIS — R69 Illness, unspecified: Secondary | ICD-10-CM | POA: Diagnosis not present

## 2015-09-03 DIAGNOSIS — R300 Dysuria: Secondary | ICD-10-CM | POA: Diagnosis not present

## 2015-09-03 DIAGNOSIS — R109 Unspecified abdominal pain: Secondary | ICD-10-CM | POA: Diagnosis not present

## 2015-09-03 DIAGNOSIS — Z1389 Encounter for screening for other disorder: Secondary | ICD-10-CM | POA: Diagnosis not present

## 2015-09-07 DIAGNOSIS — Z6841 Body Mass Index (BMI) 40.0 and over, adult: Secondary | ICD-10-CM | POA: Diagnosis not present

## 2015-09-07 DIAGNOSIS — F321 Major depressive disorder, single episode, moderate: Secondary | ICD-10-CM | POA: Diagnosis not present

## 2015-09-07 DIAGNOSIS — Z0000 Encounter for general adult medical examination without abnormal findings: Secondary | ICD-10-CM | POA: Diagnosis not present

## 2015-09-07 DIAGNOSIS — I10 Essential (primary) hypertension: Secondary | ICD-10-CM | POA: Diagnosis not present

## 2015-09-07 DIAGNOSIS — K219 Gastro-esophageal reflux disease without esophagitis: Secondary | ICD-10-CM | POA: Diagnosis not present

## 2015-09-07 DIAGNOSIS — F259 Schizoaffective disorder, unspecified: Secondary | ICD-10-CM | POA: Diagnosis not present

## 2015-09-12 DIAGNOSIS — Z0000 Encounter for general adult medical examination without abnormal findings: Secondary | ICD-10-CM | POA: Diagnosis not present

## 2015-09-12 DIAGNOSIS — Z1389 Encounter for screening for other disorder: Secondary | ICD-10-CM | POA: Diagnosis not present

## 2015-09-12 DIAGNOSIS — Z23 Encounter for immunization: Secondary | ICD-10-CM | POA: Diagnosis not present

## 2015-09-24 DIAGNOSIS — N760 Acute vaginitis: Secondary | ICD-10-CM | POA: Diagnosis not present

## 2015-09-24 DIAGNOSIS — R300 Dysuria: Secondary | ICD-10-CM | POA: Diagnosis not present

## 2015-09-24 DIAGNOSIS — R32 Unspecified urinary incontinence: Secondary | ICD-10-CM | POA: Diagnosis not present

## 2015-10-19 DIAGNOSIS — Z1389 Encounter for screening for other disorder: Secondary | ICD-10-CM | POA: Diagnosis not present

## 2015-10-19 DIAGNOSIS — J309 Allergic rhinitis, unspecified: Secondary | ICD-10-CM | POA: Diagnosis not present

## 2015-10-24 DIAGNOSIS — R32 Unspecified urinary incontinence: Secondary | ICD-10-CM | POA: Diagnosis not present

## 2015-10-24 DIAGNOSIS — N951 Menopausal and female climacteric states: Secondary | ICD-10-CM | POA: Diagnosis not present

## 2015-10-25 DIAGNOSIS — R32 Unspecified urinary incontinence: Secondary | ICD-10-CM | POA: Diagnosis not present

## 2015-10-25 DIAGNOSIS — R232 Flushing: Secondary | ICD-10-CM | POA: Diagnosis not present

## 2015-11-01 DIAGNOSIS — N393 Stress incontinence (female) (male): Secondary | ICD-10-CM | POA: Diagnosis not present

## 2015-11-01 DIAGNOSIS — N3941 Urge incontinence: Secondary | ICD-10-CM | POA: Diagnosis not present

## 2015-11-01 DIAGNOSIS — R350 Frequency of micturition: Secondary | ICD-10-CM | POA: Diagnosis not present

## 2015-11-01 DIAGNOSIS — R351 Nocturia: Secondary | ICD-10-CM | POA: Diagnosis not present

## 2015-11-09 DIAGNOSIS — F259 Schizoaffective disorder, unspecified: Secondary | ICD-10-CM | POA: Diagnosis not present

## 2015-11-23 DIAGNOSIS — R32 Unspecified urinary incontinence: Secondary | ICD-10-CM | POA: Diagnosis not present

## 2015-11-23 DIAGNOSIS — L989 Disorder of the skin and subcutaneous tissue, unspecified: Secondary | ICD-10-CM | POA: Diagnosis not present

## 2015-11-23 DIAGNOSIS — I10 Essential (primary) hypertension: Secondary | ICD-10-CM | POA: Diagnosis not present

## 2015-12-31 DIAGNOSIS — R7309 Other abnormal glucose: Secondary | ICD-10-CM | POA: Diagnosis not present

## 2015-12-31 DIAGNOSIS — N951 Menopausal and female climacteric states: Secondary | ICD-10-CM | POA: Diagnosis not present

## 2016-01-03 DIAGNOSIS — Z1211 Encounter for screening for malignant neoplasm of colon: Secondary | ICD-10-CM | POA: Diagnosis not present

## 2016-02-04 LAB — HM HIV SCREENING LAB: HM HIV SCREENING: NEGATIVE

## 2016-02-04 LAB — HIV ANTIBODY (ROUTINE TESTING W REFLEX): HIV 1&2 Ab, 4th Generation: NORMAL

## 2016-02-05 ENCOUNTER — Other Ambulatory Visit: Payer: Self-pay | Admitting: Internal Medicine

## 2016-02-05 DIAGNOSIS — Z1231 Encounter for screening mammogram for malignant neoplasm of breast: Secondary | ICD-10-CM

## 2016-02-18 ENCOUNTER — Ambulatory Visit
Admission: RE | Admit: 2016-02-18 | Discharge: 2016-02-18 | Disposition: A | Discharge status: Home / Self Care | Payer: Medicare HMO | Source: Ambulatory Visit | Attending: Internal Medicine | Admitting: Internal Medicine

## 2016-02-18 ENCOUNTER — Other Ambulatory Visit: Payer: Self-pay | Admitting: Internal Medicine

## 2016-02-18 DIAGNOSIS — Z1231 Encounter for screening mammogram for malignant neoplasm of breast: Secondary | ICD-10-CM | POA: Insufficient documentation

## 2016-02-20 ENCOUNTER — Encounter: Payer: Self-pay | Admitting: Internal Medicine

## 2016-02-25 ENCOUNTER — Emergency Department
Admission: EM | Admit: 2016-02-25 | Discharge: 2016-02-25 | Disposition: A | Discharge status: Home / Self Care | Payer: Medicare HMO | Attending: Emergency Medicine | Admitting: Emergency Medicine

## 2016-02-25 DIAGNOSIS — G8929 Other chronic pain: Secondary | ICD-10-CM | POA: Diagnosis not present

## 2016-02-25 DIAGNOSIS — M545 Low back pain, unspecified: Secondary | ICD-10-CM

## 2016-02-25 DIAGNOSIS — I10 Essential (primary) hypertension: Secondary | ICD-10-CM | POA: Insufficient documentation

## 2016-02-25 DIAGNOSIS — Z79899 Other long term (current) drug therapy: Secondary | ICD-10-CM | POA: Insufficient documentation

## 2016-02-25 DIAGNOSIS — Z8719 Personal history of other diseases of the digestive system: Secondary | ICD-10-CM | POA: Diagnosis not present

## 2016-02-25 DIAGNOSIS — F319 Bipolar disorder, unspecified: Secondary | ICD-10-CM | POA: Insufficient documentation

## 2016-02-25 DIAGNOSIS — M5136 Other intervertebral disc degeneration, lumbar region: Secondary | ICD-10-CM | POA: Insufficient documentation

## 2016-02-25 MED ORDER — CYCLOBENZAPRINE HCL 5 MG PO TABS: 5.0000 mg | ORAL_TABLET | Freq: Three times a day (TID) | ORAL | Status: DC | PRN

## 2016-02-25 MED ORDER — KETOROLAC TROMETHAMINE 10 MG PO TABS: 10.0000 mg | ORAL_TABLET | Freq: Three times a day (TID) | ORAL | Status: DC

## 2016-02-25 MED ORDER — KETOROLAC TROMETHAMINE 60 MG/2ML IM SOLN
30.0000 mg | Freq: Once | INTRAMUSCULAR | Status: AC
  Administered 2016-02-25: 30 mg via INTRAMUSCULAR
  Filled 2016-02-25: qty 2

## 2016-02-25 NOTE — Discharge Instructions (Signed)
Back Injury Prevention °Back injuries can be very painful. They can also be difficult to heal. After having one back injury, you are more likely to injure your back again. It is important to learn how to avoid injuring or re-injuring your back. The following tips can help you to prevent a back injury. °WHAT SHOULD I KNOW ABOUT PHYSICAL FITNESS? °· Exercise for 30 minutes per day on most days of the week or as directed by your health care provider. Make sure to: °· Do aerobic exercises, such as walking, jogging, biking, or swimming. °· Do exercises that increase balance and strength, such as tai chi and yoga. These can decrease your risk of falling and injuring your back. °· Do stretching exercises to help with flexibility. °· Try to develop strong abdominal muscles. Your abdominal muscles provide a lot of the support that is needed by your back. °· Maintain a healthy weight.  This helps to decrease your risk of a back injury. °WHAT SHOULD I KNOW ABOUT MY DIET? °· Talk with your health care provider about your overall diet. Take supplements and vitamins only as directed by your health care provider. °· Talk with your health care provider about how much calcium and vitamin D you need each day. These nutrients help to prevent weakening of the bones (osteoporosis). Osteoporosis can cause broken (fractured) bones, which lead to back pain. °· Include good sources of calcium in your diet, such as dairy products, green leafy vegetables, and products that have had calcium added to them (fortified). °· Include good sources of vitamin D in your diet, such as milk and foods that are fortified with vitamin D. °WHAT SHOULD I KNOW ABOUT MY POSTURE? °· Sit up straight and stand up straight. Avoid leaning forward when you sit or hunching over when you stand. °· Choose chairs that have good low-back (lumbar) support. °· If you work at a desk, sit close to it so you do not need to lean over. Keep your chin tucked in. Keep your neck  drawn back, and keep your elbows bent at a right angle. Your arms should look like the letter "L." °· Sit high and close to the steering wheel when you drive. Add a lumbar support to your car seat, if needed. °· Avoid sitting or standing in one position for very long. Take breaks to get up, stretch, and walk around at least one time every hour. Take breaks every hour if you are driving for long periods of time. °· Sleep on your side with your knees slightly bent, or sleep on your back with a pillow under your knees. Do not lie on the front of your body to sleep. °WHAT SHOULD I KNOW ABOUT LIFTING, TWISTING, AND REACHING? °Lifting and Heavy Lifting °· Avoid heavy lifting, especially repetitive heavy lifting. If you must do heavy lifting: °· Stretch before lifting. °· Work slowly. °· Rest between lifts. °· Use a tool such as a cart or a dolly to move objects if one is available. °· Make several small trips instead of carrying one heavy load. °· Ask for help when you need it, especially when moving big objects. °· Follow these steps when lifting: °· Stand with your feet shoulder-width apart. °· Get as close to the object as you can. Do not try to pick up a heavy object that is far from your body. °· Use handles or lifting straps if they are available. °· Bend at your knees. Squat down, but keep your heels off the floor. °·   Keep your shoulders pulled back, your chin tucked in, and your back straight.  Lift the object slowly while you tighten the muscles in your legs, abdomen, and buttocks. Keep the object as close to the center of your body as possible.  Follow these steps when putting down a heavy load:  Stand with your feet shoulder-width apart.  Lower the object slowly while you tighten the muscles in your legs, abdomen, and buttocks. Keep the object as close to the center of your body as possible.  Keep your shoulders pulled back, your chin tucked in, and your back straight.  Bend at your knees. Squat  down, but keep your heels off the floor.  Use handles or lifting straps if they are available. Twisting and Reaching  Avoid lifting heavy objects above your waist.  Do not twist at your waist while you are lifting or carrying a load. If you need to turn, move your feet.  Do not bend over without bending at your knees.  Avoid reaching over your head, across a table, or for an object on a high surface. WHAT ARE SOME OTHER TIPS?  Avoid wet floors and icy ground. Keep sidewalks clear of ice to prevent falls.  Do not sleep on a mattress that is too soft or too hard.  Keep items that are used frequently within easy reach.  Put heavier objects on shelves at waist level, and put lighter objects on lower or higher shelves.  Find ways to decrease your stress, such as exercise, massage, or relaxation techniques. Stress can build up in your muscles. Tense muscles are more vulnerable to injury.  Talk with your health care provider if you feel anxious or depressed. These conditions can make back pain worse.  Wear flat heel shoes with cushioned soles.  Avoid sudden movements.  Use both shoulder straps when carrying a backpack.  Do not use any tobacco products, including cigarettes, chewing tobacco, or electronic cigarettes. If you need help quitting, ask your health care provider.   This information is not intended to replace advice given to you by your health care provider. Make sure you discuss any questions you have with your health care provider.   Document Released: 10/30/2004 Document Revised: 02/06/2015 Document Reviewed: 09/26/2014 Elsevier Interactive Patient Education 2016 Else[employer] Inc[employer]  Chronic Back Pain  When back pain lasts longer than 3 months, it is called chronic back pain.People with chronic back pain often go through certain periods that are more intense (flare-ups).  CAUSES Chronic back pain can be caused by wear and tear (degeneration) on different structures in your  back. These structures include:  The bones of your spine (vertebrae) and the joints surrounding your spinal cord and nerve roots (facets).  The strong, fibrous tissues that connect your vertebrae (ligaments). Degeneration of these structures may result in pressure on your nerves. This can lead to constant pain. HOME CARE INSTRUCTIONS  Avoid bending, heavy lifting, prolonged sitting, and activities which make the problem worse.  Take brief periods of rest throughout the day to reduce your pain. Lying down or standing usually is better than sitting while you are resting.  Take over-the-counter or prescription medicines only as directed by your caregiver. SEEK IMMEDIATE MEDICAL CARE IF:   You have weakness or numbness in one of your legs or feet.  You have trouble controlling your bladder or bowels.  You have nausea, vomiting, abdominal pain, shortness of breath, or fainting.   This information is not intended to replace advice given to  you by your health care provider. Make sure you discuss any questions you have with your health care provider.   Document Released: 10/30/2004 Document Revised: 12/15/2011 Document Reviewed: 03/12/2015 Elsevier Interactive Patient Education 2016 Elsevier Inc.  Degenerative Disk Disease Degenerative disk disease is a condition caused by the changes that occur in spinal disks as you grow older. Spinal disks are soft and compressible disks located between the bones of your spine (vertebrae). These disks act like shock absorbers. Degenerative disk disease can affect the whole spine. However, the neck and lower back are most commonly affected. Many changes can occur in the spinal disks with aging, such as:  The spinal disks may dry and shrink.  Small tears may occur in the tough, outer covering of the disk (annulus).  The disk space may become smaller due to loss of water.  Abnormal growths in the bone (spurs) may occur. This can put pressure on the nerve  roots exiting the spinal canal, causing pain.  The spinal canal may become narrowed. RISK FACTORS   Being overweight.  Having a family history of degenerative disk disease.  Smoking.  There is increased risk if you are doing heavy lifting or have a sudden injury. SIGNS AND SYMPTOMS  Symptoms vary from person to person and may include:  Pain that varies in intensity. Some people have no pain, while others have severe pain. The location of the pain depends on the part of your backbone that is affected.  You will have neck or arm pain if a disk in the neck area is affected.  You will have pain in your back, buttocks, or legs if a disk in the lower back is affected.  Pain that becomes worse while bending, reaching up, or with twisting movements.  Pain that may start gradually and then get worse as time passes. It may also start after a major or minor injury.  Numbness or tingling in the arms or legs. DIAGNOSIS  Your health care provider will ask you about your symptoms and about activities or habits that may cause the pain. He or she may also ask about any injuries, diseases, or treatments you have had. Your health care provider will examine you to check for the range of movement that is possible in the affected area, to check for strength in your extremities, and to check for sensation in the areas of the arms and legs supplied by different nerve roots. You may also have:   An X-ray of the spine.  Other imaging tests, such as MRI. TREATMENT  Your health care provider will advise you on the best plan for treatment. Treatment may include:  Medicines.  Rehabilitation exercises. HOME CARE INSTRUCTIONS   Follow proper lifting and walking techniques as advised by your health care provider.  Maintain good posture.  Exercise regularly as advised by your health care provider.  Perform relaxation exercises.  Change your sitting, standing, and sleeping habits as advised by your  health care provider.  Change positions frequently.  Lose weight or maintain a healthy weight as advised by your health care provider.  Do not use any tobacco products, including cigarettes, chewing tobacco, or electronic cigarettes. If you need help quitting, ask your health care provider.  Wear supportive footwear.  Take medicines only as directed by your health care provider. SEEK MEDICAL CARE IF:   Your pain does not go away within 1-4 weeks.  You have significant appetite or weight loss. SEEK IMMEDIATE MEDICAL CARE IF:   Your  pain is severe.  You notice weakness in your arms, hands, or legs.  You begin to lose control of your bladder or bowel movements.  You have fevers or night sweats. MAKE SURE YOU:   Understand these instructions.  Will watch your condition.  Will get help right away if you are not doing well or get worse.   This information is not intended to replace advice given to you by your health care provider. Make sure you discuss any questions you have with your health care provider.   Document Released: 07/20/2007 Document Revised: 10/13/2014 Document Reviewed: 01/24/2014 Elsevier Interactive Patient Education 2016 [place] with your provider for further evaluation of symptoms. Take the prescription meds as directed.

## 2016-02-25 NOTE — ED Notes (Signed)
Pt. Going home by self.  Pt. Given work excuse for today.

## 2016-02-25 NOTE — ED Notes (Signed)
Pt in with co lower pain radiates into legs, states worse when she walks and moves.  Has had pain for a few months, tried different shoes without relief.

## 2016-02-25 NOTE — ED Provider Notes (Signed)
[facility] Emergency Department Provider Note ____________________________________________  Time seen: 2159  I have reviewed the triage vital signs and the nursing notes.  HISTORY  Chief Complaint  Back Pain  HPI Heather Sweeney is a 50 y.o. female presents to the ED with complaints of acute flare of her chronic low back pain that has been worsening over the last 3-4 weeks. She denies any interim injury, fall, or accident. Scribes bilateral lower back pain with referral down the backs of her thighs. She denies any bladder or bowel incontinence, foot drop, or lotion any paresthesias. She dosed 400 mg of ibuprofen today without lasting benefit to her back pain. She's been previously evaluated for the same chronic pain. Her chart review reveals a lumbar x-ray and MRI in May and June 2016, respectively.She was notified by her primary care provider that if she had a flare of her low back pain she should report to the ED for further evaluation and management. Patient rates her pain at a 10/10 in triage.  Past Medical History  Diagnosis Date  . Bipolar disorder ([place])   . Depression   . Hypertension   . GERD (gastroesophageal reflux disease)   . Allergic rhinitis     There are no active problems to display for this patient.   Past Surgical History  Procedure Laterality Date  . Abdominal hysterectomy  07/08/2011    Current Outpatient Rx  Name  Route  Sig  Dispense  Refill  . amLODipine (NORVASC) 10 MG tablet   Oral   Take 10 mg by mouth daily.         . benzonatate (TESSALON) 100 MG capsule   Oral   Take by mouth 3 (three) times daily as needed for cough.         . benztropine (COGENTIN) 1 MG tablet   Oral   Take 1 mg by mouth 2 (two) times daily.         . cyclobenzaprine (FLEXERIL) 5 MG tablet   Oral   Take 1 tablet (5 mg total) by mouth every 8 (eight) hours as needed for muscle spasms.   15 tablet   0   . fluPHENAZine (PROLIXIN) 2.5  MG tablet   Oral   Take 2.5 mg by mouth daily.         . fluticasone (FLONASE) 50 MCG/ACT nasal spray   Each Nare   Place 2 sprays into both nostrils daily.         Marland Kitchen ketorolac (TORADOL) 10 MG tablet   Oral   Take 1 tablet (10 mg total) by mouth every 8 (eight) hours.   15 tablet   0   . loratadine (CLARITIN) 10 MG tablet   Oral   Take 10 mg by mouth daily.         . meloxicam (MOBIC) 15 MG tablet   Oral   Take 1 tablet (15 mg total) by mouth daily.   30 tablet   0   . orphenadrine (NORFLEX) 100 MG tablet   Oral   Take 1 tablet (100 mg total) by mouth 2 (two) times daily.   30 tablet   0     Allergies Cephalexin; Lisinopril; Hctz; and Penicillins  Family History  Problem Relation Age of Onset  . Cancer Father   . Hypertension Father   . Heart failure Mother   . Hypertension Mother   . Breast cancer Sister     Social History Social History  Substance  Use Topics  . Smoking status: Never Smoker   . Smokeless tobacco: Not on file  . Alcohol Use: No    Review of Systems  Constitutional: Negative for fever. Cardiovascular: Negative for chest pain. Respiratory: Negative for shortness of breath. Gastrointestinal: Negative for abdominal pain, vomiting and diarrhea. Genitourinary: Negative for dysuria. Musculoskeletal: Positive for back pain. Skin: Negative for rash. Neurological: Negative for headaches, focal weakness or numbness. ____________________________________________  PHYSICAL EXAM:  VITAL SIGNS: ED Triage Vitals  Enc Vitals Group     BP 02/25/16 2050 135/70 mmHg     Pulse Rate 02/25/16 2048 92     Resp 02/25/16 2048 20     Temp 02/25/16 2048 98.2 F (36.8 C)     Temp Source 02/25/16 2048 Oral     SpO2 02/25/16 2048 100 %     Weight 02/25/16 2048 280 lb (127.007 kg)     Height 02/25/16 2048 5\' 3"  (1.6 m)     Head Cir --      Peak Flow --      Pain Score 02/25/16 2049 10     Pain Loc --      Pain Edu? --      Excl. in [place]? --     Constitutional: Alert and oriented. Well appearing and in no distress. Head: Normocephalic and atraumatic. Cardiovascular: Normal rate, regular rhythm.  Respiratory: Normal respiratory effort. No wheezes/rales/rhonchi. Gastrointestinal: Soft and nontender. No distention. Musculoskeletal: Normal spinal on it without midline tenderness, spasm, deformity, or step-off. Patient with a normal transition from supine to sit. She is with a negative seated straight leg raise bilaterally. She transition from sit to stand and is able to demonstrate lumbar extension and flexion without difficulty. Nontender with normal range of motion in all extremities.  Neurologic: Cranial nerves II through XII grossly intact. Normal LE DTRs bilaterally. Normal gait without ataxia. Normal speech and language. No gross focal neurologic deficits are appreciated. Skin:  Skin is warm, dry and intact. No rash noted. ____________________________________________   RADIOLOGY (Chart Review)  LS Spine (May 2016) IMPRESSION: 1. Normal alignment with mild degenerative change. Intervertebral disc spaces are unremarkable. 2. Cannot exclude mid left ureteral calculus.  Lumbar MRI (June 2016) IMPRESSION: Mild lumbar degenerative changes at L3-4 and L4-5 without focal neural impingement. Mild spinal stenosis at L4-5. ____________________________________________  PROCEDURES  Toradol 30 mg IM ____________________________________________  INITIAL IMPRESSION / ASSESSMENT AND PLAN / ED COURSE  Patient with acute flare of chronic low back pain without exam indication of neuromuscular deficit. Review of her previous MRI and plain films do indicate some mild degenerative changes but no frank HNP. She has overall benign exam and will be discharged with prescriptions for Flexeril and ketorolac. She should follow-up with primary care provider for ongoing symptom management. Return to the ED for acutely worsening  symptoms. ____________________________________________  FINAL CLINICAL IMPRESSION(S) / ED DIAGNOSES  Final diagnoses:  Acute exacerbation of chronic low back pain  Lumbar degenerative disc disease      Melvenia Needles, PA-C 02/25/16 2237  Carrie Mew, MD 03/04/16 [phone]

## 2016-02-27 ENCOUNTER — Telehealth: Payer: Self-pay | Admitting: Emergency Medicine

## 2016-02-27 NOTE — ED Notes (Signed)
Received call from [facility] re ketorolac which is not covered by insurance.  Per dr Cinda Quest can change to motrin 800 mg 3 times a day as needed for pain with 5 day supply.

## 2016-02-29 ENCOUNTER — Encounter: Payer: Self-pay | Admitting: Internal Medicine

## 2016-03-06 ENCOUNTER — Encounter: Payer: Self-pay | Admitting: Internal Medicine

## 2016-06-07 ENCOUNTER — Emergency Department
Admission: EM | Admit: 2016-06-07 | Discharge: 2016-06-07 | Disposition: A | Discharge status: Home / Self Care | Payer: Medicare HMO | Attending: Emergency Medicine | Admitting: Emergency Medicine

## 2016-06-07 ENCOUNTER — Emergency Department: Payer: Medicare HMO

## 2016-06-07 DIAGNOSIS — Y92007 Garden or yard of unspecified non-institutional (private) residence as the place of occurrence of the external cause: Secondary | ICD-10-CM | POA: Insufficient documentation

## 2016-06-07 DIAGNOSIS — I10 Essential (primary) hypertension: Secondary | ICD-10-CM | POA: Insufficient documentation

## 2016-06-07 DIAGNOSIS — Y999 Unspecified external cause status: Secondary | ICD-10-CM | POA: Insufficient documentation

## 2016-06-07 DIAGNOSIS — W010XXA Fall on same level from slipping, tripping and stumbling without subsequent striking against object, initial encounter: Secondary | ICD-10-CM | POA: Diagnosis not present

## 2016-06-07 DIAGNOSIS — Y9389 Activity, other specified: Secondary | ICD-10-CM | POA: Insufficient documentation

## 2016-06-07 DIAGNOSIS — S8001XA Contusion of right knee, initial encounter: Secondary | ICD-10-CM

## 2016-06-07 DIAGNOSIS — W19XXXA Unspecified fall, initial encounter: Secondary | ICD-10-CM

## 2016-06-07 DIAGNOSIS — Z79899 Other long term (current) drug therapy: Secondary | ICD-10-CM | POA: Diagnosis not present

## 2016-06-07 DIAGNOSIS — M25561 Pain in right knee: Secondary | ICD-10-CM | POA: Diagnosis present

## 2016-06-07 MED ORDER — MELOXICAM 15 MG PO TABS: 15.0000 mg | ORAL_TABLET | Freq: Every day | ORAL | 0 refills | Status: DC

## 2016-06-07 NOTE — ED Triage Notes (Signed)
Patient states she fell two days ago and presents today with right knee pain. Knee is visibly swollen but no deformity is noted. Pulses, motor/sensation intact. Ambulatory to registration desk and then placed in a wheelchair upon request.

## 2016-06-07 NOTE — ED Provider Notes (Signed)
[facility] Emergency Department Provider Note  ____________________________________________  Time seen: Approximately 10:27 PM  I have reviewed the triage vital signs and the nursing notes.   HISTORY  Chief Complaint Knee Pain    HPI Heather Sweeney is a 50 y.o. female who presents emergency department complaining of right knee pain. Patient states that she tripped and fell and landed on the right knee 2 days prior. Patient initially did not think anything of it. She felt that she had bruised her knee up. She tried some home remedies without relief of symptoms. Patient states that she is able to ambulate on the buttock feels "tight". Patient has tried one dose of Motrin without relief of symptoms. No other complaints or injuries.   Past Medical History:  Diagnosis Date  . Allergic rhinitis   . Bipolar disorder ([place])   . Depression   . GERD (gastroesophageal reflux disease)   . Hypertension     There are no active problems to display for this patient.   Past Surgical History:  Procedure Laterality Date  . ABDOMINAL HYSTERECTOMY  07/08/2011    Prior to Admission medications   Medication Sig Start Date End Date Taking? Authorizing Provider  amLODipine (NORVASC) 10 MG tablet Take 10 mg by mouth daily.    Historical Provider, MD  benzonatate (TESSALON) 100 MG capsule Take by mouth 3 (three) times daily as needed for cough.    Historical Provider, MD  benztropine (COGENTIN) 1 MG tablet Take 1 mg by mouth 2 (two) times daily.    Historical Provider, MD  cyclobenzaprine (FLEXERIL) 5 MG tablet Take 1 tablet (5 mg total) by mouth every 8 (eight) hours as needed for muscle spasms. 02/25/16   Jenise V Bacon Menshew, PA-C  fluPHENAZine (PROLIXIN) 2.5 MG tablet Take 2.5 mg by mouth daily.    Historical Provider, MD  fluticasone (FLONASE) 50 MCG/ACT nasal spray Place 2 sprays into both nostrils daily.    Historical Provider, MD  ketorolac (TORADOL) 10 MG  tablet Take 1 tablet (10 mg total) by mouth every 8 (eight) hours. 02/25/16   Jenise V Bacon Menshew, PA-C  loratadine (CLARITIN) 10 MG tablet Take 10 mg by mouth daily.    Historical Provider, MD  meloxicam (MOBIC) 15 MG tablet Take 1 tablet (15 mg total) by mouth daily. 06/07/16   Charline Bills Nylen Creque, PA-C  orphenadrine (NORFLEX) 100 MG tablet Take 1 tablet (100 mg total) by mouth 2 (two) times daily. 02/07/15   [name], PA-C    Allergies Cephalexin; Lisinopril; Hctz [hydrochlorothiazide]; and Penicillins  Family History  Problem Relation Age of Onset  . Cancer Father   . Hypertension Father   . Heart failure Mother   . Hypertension Mother   . Breast cancer Sister     Social History Social History  Substance Use Topics  . Smoking status: Never Smoker  . Smokeless tobacco: Not on file  . Alcohol use No     Review of Systems  Constitutional: No fever/chills Cardiovascular: no chest pain. Respiratory: no cough. No SOB. Musculoskeletal: Positive for right knee pain Skin: Negative for rash, abrasions, lacerations, ecchymosis. Neurological: Negative for headaches, focal weakness or numbness. 10-point ROS otherwise negative.  ____________________________________________   PHYSICAL EXAM:  VITAL SIGNS: ED Triage Vitals  Enc Vitals Group     BP 06/07/16 2111 135/73     Pulse Rate 06/07/16 2111 100     Resp 06/07/16 2111 20     Temp 06/07/16 2111 98  F (36.7 C)     Temp Source 06/07/16 2111 Oral     SpO2 06/07/16 2111 100 %     Weight 06/07/16 2111 287 lb (130.2 kg)     Height 06/07/16 2111 5\' 3"  (1.6 m)     Head Circumference --      Peak Flow --      Pain Score 06/07/16 2112 8     Pain Loc --      Pain Edu? --      Excl. in [place]? --      Constitutional: Alert and oriented. Well appearing and in no acute distress. Eyes: Conjunctivae are normal. PERRL. EOMI. Head: Atraumatic. Cardiovascular: Normal rate, regular rhythm. Normal S1 and S2.  Good peripheral  circulation. Respiratory: Normal respiratory effort without tachypnea or retractions. Lungs CTAB. Good air entry to the bases with no decreased or absent breath sounds. Musculoskeletal: Full range of motion to all extremities. No gross deformities appreciated. No deformities noted to right knee upon inspection. No gross edema when compared with left knee. Patient is full range of motion to knee. Patient is mildly tender to palpation over the anteromedial aspect of the knee. No specific point tenderness. No palpable abnormality. There is, valgus, Lachman's, McMurray's is negative. Sensation and pulses intact distally. Neurologic:  Normal speech and language. No gross focal neurologic deficits are appreciated.  Skin:  Skin is warm, dry and intact. No rash noted. Psychiatric: Mood and affect are normal. Speech and behavior are normal. Patient exhibits appropriate insight and judgement.   ____________________________________________   LABS (all labs ordered are listed, but only abnormal results are displayed)  Labs Reviewed - No data to display ____________________________________________  EKG   ____________________________________________  RADIOLOGY Diamantina Providence Lasheka Kempner, personally viewed and evaluated these images (plain radiographs) as part of my medical decision making, as well as reviewing the written report by the radiologist.  Dg Knee Complete 4 Views Right  Result Date: 06/07/2016 CLINICAL DATA:  [place] in yard 3 days ago. Right knee pain swelling. Initial encounter. EXAM: RIGHT KNEE - COMPLETE 4+ VIEW COMPARISON:  None. FINDINGS: No evidence of fracture, dislocation, or joint effusion. Mild tricompartmental degenerative spurring seen, without significant joint space narrowing. IMPRESSION: No acute findings.  Early degenerative spurring. Electronically Signed   By: Earle Gell M.D.   On: 06/07/2016 22:07     ____________________________________________    PROCEDURES  Procedure(s) performed:    Procedures    Medications - No data to display   ____________________________________________   INITIAL IMPRESSION / ASSESSMENT AND PLAN / ED COURSE  Pertinent labs & imaging results that were available during my care of the patient were reviewed by me and considered in my medical decision making (see chart for details).  Review of the [place] CSRS was performed in accordance of the [place] prior to dispensing any controlled drugs.  Clinical Course    Patient's diagnosis is consistent with Right knee contusion. Exam is reassuring for no indication of ligamentous injury. X-ray reveals no acute osseous abnormality.. Patient will be discharged home with prescriptions for anti-inflammatories. Patient is to follow up with primary care as needed or otherwise directed. Patient is given ED precautions to return to the ED for any worsening or new symptoms.     ____________________________________________  FINAL CLINICAL IMPRESSION(S) / ED DIAGNOSES  Final diagnoses:  Knee contusion, right, initial encounter  Fall, initial encounter      NEW MEDICATIONS STARTED DURING THIS VISIT:  New Prescriptions   MELOXICAM (MOBIC)  15 MG TABLET    Take 1 tablet (15 mg total) by mouth daily.        This chart was dictated using voice recognition software/Dragon. Despite best efforts to proofread, errors can occur which can change the meaning. Any change was purely unintentional.    Darletta Moll, PA-C 06/07/16 2232    Hinda Kehr, MD 06/08/16 [id number]

## 2016-07-10 DIAGNOSIS — Z9989 Dependence on other enabling machines and devices: Secondary | ICD-10-CM

## 2016-07-10 DIAGNOSIS — I10 Essential (primary) hypertension: Secondary | ICD-10-CM | POA: Insufficient documentation

## 2016-07-10 DIAGNOSIS — G4733 Obstructive sleep apnea (adult) (pediatric): Secondary | ICD-10-CM | POA: Insufficient documentation

## 2016-07-11 ENCOUNTER — Emergency Department: Payer: Medicare HMO

## 2016-07-11 ENCOUNTER — Observation Stay
Admission: EM | Admit: 2016-07-11 | Discharge: 2016-07-12 | Disposition: A | Discharge status: Home / Self Care | Payer: Medicare HMO | Attending: Internal Medicine | Admitting: Internal Medicine

## 2016-07-11 ENCOUNTER — Other Ambulatory Visit: Payer: Self-pay

## 2016-07-11 ENCOUNTER — Encounter: Payer: Self-pay | Admitting: Emergency Medicine

## 2016-07-11 DIAGNOSIS — F319 Bipolar disorder, unspecified: Secondary | ICD-10-CM | POA: Insufficient documentation

## 2016-07-11 DIAGNOSIS — Z7982 Long term (current) use of aspirin: Secondary | ICD-10-CM | POA: Insufficient documentation

## 2016-07-11 DIAGNOSIS — E876 Hypokalemia: Secondary | ICD-10-CM | POA: Diagnosis not present

## 2016-07-11 DIAGNOSIS — R079 Chest pain, unspecified: Secondary | ICD-10-CM | POA: Diagnosis present

## 2016-07-11 DIAGNOSIS — F419 Anxiety disorder, unspecified: Secondary | ICD-10-CM | POA: Diagnosis not present

## 2016-07-11 DIAGNOSIS — Z888 Allergy status to other drugs, medicaments and biological substances status: Secondary | ICD-10-CM | POA: Insufficient documentation

## 2016-07-11 DIAGNOSIS — I509 Heart failure, unspecified: Secondary | ICD-10-CM | POA: Insufficient documentation

## 2016-07-11 DIAGNOSIS — Z880 Allergy status to penicillin: Secondary | ICD-10-CM | POA: Insufficient documentation

## 2016-07-11 DIAGNOSIS — Z6841 Body Mass Index (BMI) 40.0 and over, adult: Secondary | ICD-10-CM | POA: Diagnosis not present

## 2016-07-11 DIAGNOSIS — R0789 Other chest pain: Secondary | ICD-10-CM | POA: Diagnosis not present

## 2016-07-11 DIAGNOSIS — I110 Hypertensive heart disease with heart failure: Secondary | ICD-10-CM | POA: Insufficient documentation

## 2016-07-11 DIAGNOSIS — Z90710 Acquired absence of both cervix and uterus: Secondary | ICD-10-CM | POA: Insufficient documentation

## 2016-07-11 DIAGNOSIS — Z23 Encounter for immunization: Secondary | ICD-10-CM | POA: Diagnosis not present

## 2016-07-11 DIAGNOSIS — Z809 Family history of malignant neoplasm, unspecified: Secondary | ICD-10-CM | POA: Insufficient documentation

## 2016-07-11 DIAGNOSIS — E669 Obesity, unspecified: Secondary | ICD-10-CM | POA: Insufficient documentation

## 2016-07-11 DIAGNOSIS — R200 Anesthesia of skin: Secondary | ICD-10-CM | POA: Insufficient documentation

## 2016-07-11 DIAGNOSIS — F329 Major depressive disorder, single episode, unspecified: Secondary | ICD-10-CM | POA: Diagnosis not present

## 2016-07-11 DIAGNOSIS — J309 Allergic rhinitis, unspecified: Secondary | ICD-10-CM | POA: Insufficient documentation

## 2016-07-11 DIAGNOSIS — Z803 Family history of malignant neoplasm of breast: Secondary | ICD-10-CM | POA: Diagnosis not present

## 2016-07-11 DIAGNOSIS — R7303 Prediabetes: Secondary | ICD-10-CM | POA: Insufficient documentation

## 2016-07-11 DIAGNOSIS — Z8249 Family history of ischemic heart disease and other diseases of the circulatory system: Secondary | ICD-10-CM | POA: Insufficient documentation

## 2016-07-11 DIAGNOSIS — K8020 Calculus of gallbladder without cholecystitis without obstruction: Secondary | ICD-10-CM | POA: Insufficient documentation

## 2016-07-11 DIAGNOSIS — Z79899 Other long term (current) drug therapy: Secondary | ICD-10-CM | POA: Insufficient documentation

## 2016-07-11 DIAGNOSIS — K219 Gastro-esophageal reflux disease without esophagitis: Secondary | ICD-10-CM | POA: Diagnosis not present

## 2016-07-11 LAB — CBC
HEMATOCRIT: 38.4 % (ref 35.0–47.0)
HEMOGLOBIN: 13.4 g/dL (ref 12.0–16.0)
MCH: 28.4 pg (ref 26.0–34.0)
MCHC: 34.8 g/dL (ref 32.0–36.0)
MCV: 81.4 fL (ref 80.0–100.0)
Platelets: 183 10*3/uL (ref 150–440)
RBC: 4.71 MIL/uL (ref 3.80–5.20)
RDW: 14.1 % (ref 11.5–14.5)
WBC: 8.1 10*3/uL (ref 3.6–11.0)

## 2016-07-11 LAB — BASIC METABOLIC PANEL
ANION GAP: 8 (ref 5–15)
BUN: 9 mg/dL (ref 6–20)
CALCIUM: 9.4 mg/dL (ref 8.9–10.3)
CO2: 25 mmol/L (ref 22–32)
Chloride: 104 mmol/L (ref 101–111)
Creatinine, Ser: 0.94 mg/dL (ref 0.44–1.00)
GFR calc Af Amer: >60 mL/min (ref >60)
GFR calc non Af Amer: >60 mL/min (ref >60)
GLUCOSE: 106 mg/dL — AB (ref 65–99)
POTASSIUM: 3.5 mmol/L (ref 3.5–5.1)
Sodium: 137 mmol/L (ref 135–145)

## 2016-07-11 LAB — TROPONIN I
Troponin I: <0.03 ng/mL (ref <0.03)
Troponin I: <0.03 ng/mL (ref <0.03)

## 2016-07-11 LAB — GLUCOSE, CAPILLARY: Glucose-Capillary: 106 mg/dL — ABNORMAL HIGH (ref 65–99)

## 2016-07-11 LAB — BRAIN NATRIURETIC PEPTIDE: B Natriuretic Peptide: 7 pg/mL (ref 0.0–100.0)

## 2016-07-11 LAB — FIBRIN DERIVATIVES D-DIMER (ARMC ONLY): Fibrin derivatives D-dimer (ARMC): 1097 — ABNORMAL HIGH (ref 0–499)

## 2016-07-11 MED ORDER — IOPAMIDOL (ISOVUE-370) INJECTION 76%
75.0000 mL | Freq: Once | INTRAVENOUS | Status: AC | PRN
  Administered 2016-07-11: 75 mL via INTRAVENOUS
  Filled 2016-07-11: qty 75

## 2016-07-11 MED ORDER — PNEUMOCOCCAL VAC POLYVALENT 25 MCG/0.5ML IJ INJ
0.5000 mL | INJECTION | INTRAMUSCULAR | Status: AC
  Administered 2016-07-12: 0.5 mL via INTRAMUSCULAR
  Filled 2016-07-11: qty 0.5

## 2016-07-11 MED ORDER — ACETAMINOPHEN 650 MG RE SUPP: 650.0000 mg | Freq: Four times a day (QID) | RECTAL | Status: DC | PRN

## 2016-07-11 MED ORDER — SODIUM CHLORIDE 0.9% FLUSH: 3.0000 mL | INTRAVENOUS | Status: DC | PRN

## 2016-07-11 MED ORDER — POLYETHYLENE GLYCOL 3350 17 G PO PACK
17.0000 g | PACK | Freq: Every day | ORAL | Status: DC | PRN
Start: 2016-07-11 — End: 2016-07-12

## 2016-07-11 MED ORDER — FERROUS SULFATE 325 (65 FE) MG PO TABS
325.0000 mg | ORAL_TABLET | Freq: Every day | ORAL | Status: DC
  Administered 2016-07-12: 325 mg via ORAL
  Filled 2016-07-11: qty 1

## 2016-07-11 MED ORDER — PANTOPRAZOLE SODIUM 40 MG PO TBEC
40.0000 mg | DELAYED_RELEASE_TABLET | Freq: Every day | ORAL | Status: DC
  Administered 2016-07-12: 40 mg via ORAL
  Filled 2016-07-11: qty 1

## 2016-07-11 MED ORDER — ENOXAPARIN SODIUM 40 MG/0.4ML ~~LOC~~ SOLN
40.0000 mg | Freq: Two times a day (BID) | SUBCUTANEOUS | Status: DC
Start: 2016-07-11 — End: 2016-07-12
  Administered 2016-07-11 – 2016-07-12 (×2): 40 mg via SUBCUTANEOUS
  Filled 2016-07-11 (×2): qty 0.4

## 2016-07-11 MED ORDER — ASPIRIN 81 MG PO CHEW
CHEWABLE_TABLET | ORAL | Status: AC
  Administered 2016-07-11: 324 mg via ORAL
  Filled 2016-07-11: qty 4

## 2016-07-11 MED ORDER — DARIFENACIN HYDROBROMIDE ER 7.5 MG PO TB24
15.0000 mg | ORAL_TABLET | Freq: Every day | ORAL | Status: DC
  Administered 2016-07-11 – 2016-07-12 (×2): 15 mg via ORAL
  Filled 2016-07-11 (×3): qty 2

## 2016-07-11 MED ORDER — ONDANSETRON HCL 4 MG/2ML IJ SOLN: 4.0000 mg | Freq: Four times a day (QID) | INTRAMUSCULAR | Status: DC | PRN

## 2016-07-11 MED ORDER — ASPIRIN EC 81 MG PO TBEC
81.0000 mg | DELAYED_RELEASE_TABLET | Freq: Every day | ORAL | Status: DC
  Administered 2016-07-12: 81 mg via ORAL
  Filled 2016-07-11: qty 1

## 2016-07-11 MED ORDER — AMLODIPINE BESYLATE 5 MG PO TABS
10.0000 mg | ORAL_TABLET | Freq: Every day | ORAL | Status: DC
  Administered 2016-07-12: 10 mg via ORAL
  Filled 2016-07-11: qty 2

## 2016-07-11 MED ORDER — BENZTROPINE MESYLATE 1 MG PO TABS
1.0000 mg | ORAL_TABLET | Freq: Two times a day (BID) | ORAL | Status: DC
Start: 2016-07-11 — End: 2016-07-12
  Administered 2016-07-11 – 2016-07-12 (×2): 1 mg via ORAL
  Filled 2016-07-11 (×3): qty 1

## 2016-07-11 MED ORDER — SODIUM CHLORIDE 0.9 % IV SOLN: 250.0000 mL | INTRAVENOUS | Status: DC | PRN

## 2016-07-11 MED ORDER — ASPIRIN 81 MG PO CHEW
324.0000 mg | CHEWABLE_TABLET | Freq: Once | ORAL | Status: AC
  Administered 2016-07-11: 324 mg via ORAL

## 2016-07-11 MED ORDER — ALBUTEROL SULFATE (2.5 MG/3ML) 0.083% IN NEBU: 2.5000 mg | INHALATION_SOLUTION | RESPIRATORY_TRACT | Status: DC | PRN

## 2016-07-11 MED ORDER — SODIUM CHLORIDE 0.9% FLUSH
3.0000 mL | Freq: Two times a day (BID) | INTRAVENOUS | Status: DC
  Administered 2016-07-11: 3 mL via INTRAVENOUS

## 2016-07-11 MED ORDER — FLUPHENAZINE HCL 5 MG PO TABS
2.5000 mg | ORAL_TABLET | Freq: Every day | ORAL | Status: DC
  Administered 2016-07-11 – 2016-07-12 (×2): 2.5 mg via ORAL
  Filled 2016-07-11 (×3): qty 1

## 2016-07-11 MED ORDER — ACETAMINOPHEN 325 MG PO TABS: 650.0000 mg | ORAL_TABLET | Freq: Four times a day (QID) | ORAL | Status: DC | PRN

## 2016-07-11 MED ORDER — INFLUENZA VAC SPLIT QUAD 0.5 ML IM SUSY
0.5000 mL | PREFILLED_SYRINGE | INTRAMUSCULAR | Status: AC
  Administered 2016-07-12: 0.5 mL via INTRAMUSCULAR
  Filled 2016-07-11: qty 0.5

## 2016-07-11 MED ORDER — HYDROCODONE-ACETAMINOPHEN 5-325 MG PO TABS
1.0000 | ORAL_TABLET | Freq: Four times a day (QID) | ORAL | Status: DC | PRN
  Administered 2016-07-12: 1 via ORAL
  Filled 2016-07-11 (×2): qty 1

## 2016-07-11 MED ORDER — FLUTICASONE PROPIONATE 50 MCG/ACT NA SUSP
2.0000 | Freq: Every day | NASAL | Status: DC
  Administered 2016-07-11: 2 via NASAL
  Filled 2016-07-11: qty 16

## 2016-07-11 MED ORDER — ONDANSETRON HCL 4 MG PO TABS: 4.0000 mg | ORAL_TABLET | Freq: Four times a day (QID) | ORAL | Status: DC | PRN

## 2016-07-11 MED ORDER — LOSARTAN POTASSIUM 50 MG PO TABS
100.0000 mg | ORAL_TABLET | Freq: Every day | ORAL | Status: DC
  Administered 2016-07-11 – 2016-07-12 (×2): 100 mg via ORAL
  Filled 2016-07-11 (×2): qty 2

## 2016-07-11 NOTE — ED Provider Notes (Addendum)
[facility] Emergency Department Provider Note  ____________________________________________   I have reviewed the triage vital signs and the nursing notes.   HISTORY  Chief Complaint Chest Pain and Numbness (radiates to left arm)    HPI Heather Sweeney is a 50 y.o. female with a history of hypertension and obesity, and borderline diabetes. Also family history of CAD at a young age, her mother died in her 85s. Patienthas had increasing dyspnea over the last couple weeks and especially when she lies flat. She went to see cardiology for this, and they were going to do an outpatient echo. However, this morning, at rest, at 10:30 she began to have left-sided chest discomfort which was a palpitation and also a discomfort and pain which radiated down towards her left arm and left jaw. The pain is now gone. She states it lasted for a few hours. She states it was possibly worse when she walked around. Patient is somewhat of a vague historian. It is difficult for her to quantify exactly what the pain felt like. She's never had it before. She states it was somewhat sharp. She denies any pleuritic component to this although she did feel somewhat short of breath. She has had no unilateral leg swelling recent travel or exotic as estrogens. No recent surgery.   Past Medical History:  Diagnosis Date  . Allergic rhinitis   . Bipolar disorder ([place])   . CHF (congestive heart failure) ([place])   . Depression   . GERD (gastroesophageal reflux disease)   . Hypertension     There are no active problems to display for this patient.   Past Surgical History:  Procedure Laterality Date  . ABDOMINAL HYSTERECTOMY  07/08/2011    Prior to Admission medications   Medication Sig Start Date End Date Taking? Authorizing Provider  amLODipine (NORVASC) 10 MG tablet Take 10 mg by mouth daily.    Historical Provider, MD  benzonatate (TESSALON) 100 MG capsule Take by mouth 3 (three) times  daily as needed for cough.    Historical Provider, MD  benztropine (COGENTIN) 1 MG tablet Take 1 mg by mouth 2 (two) times daily.    Historical Provider, MD  cyclobenzaprine (FLEXERIL) 5 MG tablet Take 1 tablet (5 mg total) by mouth every 8 (eight) hours as needed for muscle spasms. 02/25/16   Jenise V Bacon Menshew, PA-C  fluPHENAZine (PROLIXIN) 2.5 MG tablet Take 2.5 mg by mouth daily.    Historical Provider, MD  fluticasone (FLONASE) 50 MCG/ACT nasal spray Place 2 sprays into both nostrils daily.    Historical Provider, MD  ketorolac (TORADOL) 10 MG tablet Take 1 tablet (10 mg total) by mouth every 8 (eight) hours. 02/25/16   Jenise V Bacon Menshew, PA-C  loratadine (CLARITIN) 10 MG tablet Take 10 mg by mouth daily.    Historical Provider, MD  meloxicam (MOBIC) 15 MG tablet Take 1 tablet (15 mg total) by mouth daily. 06/07/16   Charline Bills Cuthriell, PA-C  orphenadrine (NORFLEX) 100 MG tablet Take 1 tablet (100 mg total) by mouth 2 (two) times daily. 02/07/15   [name], PA-C    Allergies Cephalexin; Lisinopril; Hctz [hydrochlorothiazide]; and Penicillins  Family History  Problem Relation Age of Onset  . Cancer Father   . Hypertension Father   . Heart failure Mother   . Hypertension Mother   . Breast cancer Sister     Social History Social History  Substance Use Topics  . Smoking status: Never Smoker  .  Smokeless tobacco: Never Used  . Alcohol use No    Review of Systems }Constitutional: No fever/chills Eyes: No visual changes. ENT: No sore throat. No stiff neck no neck pain Cardiovascular: Positive chest pain. Respiratory: Positive shortness of breath. Gastrointestinal:   no vomiting.  No diarrhea.  No constipation. Genitourinary: Negative for dysuria. Musculoskeletal: Negative lower extremity swelling Skin: Negative for rash. Neurological: Negative for severe headaches, focal weakness or numbness. 10-point ROS otherwise  negative.  ____________________________________________   PHYSICAL EXAM:  VITAL SIGNS: ED Triage Vitals  Enc Vitals Group     BP 07/11/16 1224 135/70     Pulse Rate 07/11/16 1224 91     Resp 07/11/16 1224 (!) 22     Temp 07/11/16 1224 98.4 F (36.9 C)     Temp Source 07/11/16 1224 Oral     SpO2 07/11/16 1224 97 %     Weight 07/11/16 1226 288 lb (130.6 kg)     Height 07/11/16 1226 5\' 3"  (1.6 m)     Head Circumference --      Peak Flow --      Pain Score 07/11/16 1226 9     Pain Loc --      Pain Edu? --      Excl. in [place]? --     Constitutional: Alert and oriented. Well appearing and in no acute distress. Eyes: Conjunctivae are normal. PERRL. EOMI. Head: Atraumatic. Nose: No congestion/rhinnorhea. Mouth/Throat: Mucous membranes are moist.  Oropharynx non-erythematous. Neck: No stridor.   Nontender with no meningismus Cardiovascular: Normal rate, regular rhythm. Grossly normal heart sounds.  Good peripheral circulation. Respiratory: Normal respiratory effort.  No retractions. Lungs CTAB. Abdominal: Soft and nontender. No distention. No guarding no rebound Back:  There is no focal tenderness or step off.  there is no midline tenderness there are no lesions noted. there is no CVA tenderness Musculoskeletal: No lower extremity tenderness, no upper extremity tenderness. No joint effusions, no DVT signs strong distal pulses no edema Neurologic:  Normal speech and language. No gross focal neurologic deficits are appreciated.  Skin:  Skin is warm, dry and intact. No rash noted. Psychiatric: Mood and affect are normal. Speech and behavior are normal.  ____________________________________________   LABS (all labs ordered are listed, but only abnormal results are displayed)  Labs Reviewed  BASIC METABOLIC PANEL - Abnormal; Notable for the following:       Result Value   Glucose, Bld 106 (*)    All other components within normal limits  GLUCOSE, CAPILLARY - Abnormal; Notable for  the following:    Glucose-Capillary 106 (*)    All other components within normal limits  CBC  TROPONIN I  BRAIN NATRIURETIC PEPTIDE  FIBRIN DERIVATIVES D-DIMER ([facility] ONLY)  TROPONIN I   ____________________________________________  EKG  I personally interpreted any EKGs ordered by me or triage Sinus rhythm rate 88 bpm no acute ST elevation or depression normal axis unremarkable EKG borderline LVH ____________________________________________  RADIOLOGY  I reviewed any imaging ordered by me or triage that were performed during my shift and, if possible, patient and/or family made aware of any abnormal findings. ____________________________________________   PROCEDURES  Procedure(s) performed: None  Procedures  Critical Care performed: None  ____________________________________________   INITIAL IMPRESSION / ASSESSMENT AND PLAN / ED COURSE  Pertinent labs & imaging results that were available during my care of the patient were reviewed by me and considered in my medical decision making (see chart for details).  Patient with nonpleuritic chest  discomfort radiating to her jaw and arm which in some ways seem to be somewhat exertional to her, it made her sit down in any event. She is somewhat of a poor historian. Patient has had increasing dyspnea over the last few weeks. She just initiated care with cardiology yesterday. All this is concerning. I have low suspicion for PE because of her mother had a blood clot, we'll send a d-dimer. If that is negative I do not think that CT will be mandated. I did discuss with Dr. Ubaldo Glassing, who is on-call for Dr. Nehemiah Massed and he agrees with management and does advise admission and for this patient given her symptoms and progression over the last few weeks.  Clinical Course   ____________________________________________   FINAL CLINICAL IMPRESSION(S) / ED DIAGNOSES  Final diagnoses:  None      This chart was dictated using voice  recognition software.  Despite best efforts to proofread,  errors can occur which can change meaning.      Schuyler Amor, MD 07/11/16 1627    Schuyler Amor, MD 07/11/16 [phone]

## 2016-07-11 NOTE — H&P (Signed)
[place] [facility] [place] NAME: Heather Sweeney    MR#:  [id number]  DATE OF BIRTH:  03/24/1966  DATE OF ADMISSION:  07/11/2016  PRIMARY CARE PHYSICIAN: CAMPBELL, Creola Corn, MD (Inactive)   REQUESTING/REFERRING PHYSICIAN: Dr. Burlene Arnt  CHIEF COMPLAINT:   Chief Complaint  Patient presents with  . Chest Pain  . Numbness    radiates to left arm    HISTORY OF PRESENT ILLNESS:  Heather Sweeney  is a 50 y.o. female with a known history of Hypertension, GERD presents to the emergency room complaining of chest pain with some left arm numbness. Asked a few hours. She went to urgent care initially and was referred to the emergency room. Here patient had normal EKG with normal troponin. She is being admitted to rule out acute coronary syndrome. She was seen by Dr. Nehemiah Massed of cardiology yesterday in his office and she was scheduled to get an echocardiogram as outpatient.  PAST MEDICAL HISTORY:   Past Medical History:  Diagnosis Date  . Allergic rhinitis   . Bipolar disorder ([place])   . CHF (congestive heart failure) ([place])   . Depression   . GERD (gastroesophageal reflux disease)   . Hypertension     PAST SURGICAL HISTORY:   Past Surgical History:  Procedure Laterality Date  . ABDOMINAL HYSTERECTOMY  07/08/2011    SOCIAL HISTORY:   Social History  Substance Use Topics  . Smoking status: Never Smoker  . Smokeless tobacco: Never Used  . Alcohol use No    FAMILY HISTORY:   Family History  Problem Relation Age of Onset  . Cancer Father   . Hypertension Father   . Heart failure Mother   . Hypertension Mother   . Breast cancer Sister     DRUG ALLERGIES:   Allergies  Allergen Reactions  . Cephalexin Swelling  . Lisinopril Other (See Comments)    headache  . Hctz [Hydrochlorothiazide] Rash  . Penicillins Rash    REVIEW OF SYSTEMS:   Review of Systems  Constitutional: Positive for malaise/fatigue. Negative for  chills, fever and weight loss.  HENT: Negative for hearing loss and nosebleeds.   Eyes: Negative for blurred vision, double vision and pain.  Respiratory: Negative for cough, hemoptysis, sputum production, shortness of breath and wheezing.   Cardiovascular: Positive for chest pain. Negative for palpitations, orthopnea and leg swelling.  Gastrointestinal: Negative for abdominal pain, constipation, diarrhea, nausea and vomiting.  Genitourinary: Negative for dysuria and hematuria.  Musculoskeletal: Negative for back pain, falls and myalgias.  Skin: Negative for rash.  Neurological: Negative for dizziness, tremors, sensory change, speech change, focal weakness, seizures and headaches.  Endo/Heme/Allergies: Does not bruise/bleed easily.  Psychiatric/Behavioral: Negative for depression and memory loss. The patient is not nervous/anxious.     MEDICATIONS AT HOME:   Prior to Admission medications   Medication Sig Start Date End Date Taking? Authorizing Provider  amLODipine (NORVASC) 5 MG tablet Take 10 mg by mouth daily.   Yes Historical Provider, MD  benztropine (COGENTIN) 1 MG tablet Take 1 mg by mouth 2 (two) times daily.   Yes Historical Provider, MD  ferrous sulfate 325 (65 FE) MG tablet Take 325 mg by mouth daily with breakfast.   Yes Historical Provider, MD  fluPHENAZine (PROLIXIN) 2.5 MG tablet Take 2.5 mg by mouth daily.   Yes Historical Provider, MD  fluticasone (FLONASE) 50 MCG/ACT nasal spray Place 2 sprays into both nostrils daily.   Yes Historical Provider, MD  losartan (COZAAR) 100 MG tablet Take 100 mg by mouth daily.   Yes Historical Provider, MD  Multiple Vitamin (MULTIVITAMIN) tablet Take 1 tablet by mouth daily.   Yes Historical Provider, MD  pantoprazole (PROTONIX) 40 MG tablet Take 40 mg by mouth daily.   Yes Historical Provider, MD  solifenacin (VESICARE) 10 MG tablet Take 10 mg by mouth daily.   Yes Historical Provider, MD  vitamin B-12 (CYANOCOBALAMIN) 1000 MCG tablet Take  1,000 mcg by mouth daily.   Yes Historical Provider, MD     VITAL SIGNS:  Blood pressure 136/71, pulse 85, temperature 98.4 F (36.9 C), temperature source Oral, resp. rate 20, height 5\' 3"  (1.6 m), weight 130.6 kg (288 lb), SpO2 98 %.  PHYSICAL EXAMINATION:  Physical Exam  GENERAL:  50 y.o.-year-old patient lying in the bed with no acute distress. Morbidly obese EYES: Pupils equal, round, reactive to light and accommodation. No scleral icterus. Extraocular muscles intact.  HEENT: Head atraumatic, normocephalic. Oropharynx and nasopharynx clear. No oropharyngeal erythema, moist oral mucosa  NECK:  Supple, no jugular venous distention. No thyroid enlargement, no tenderness.  LUNGS: Normal breath sounds bilaterally, no wheezing, rales, rhonchi. No use of accessory muscles of respiration.  CARDIOVASCULAR: S1, S2 normal. No murmurs, rubs, or gallops.  ABDOMEN: Soft, nontender, nondistended. Bowel sounds present. No organomegaly or mass.  EXTREMITIES: No pedal edema, cyanosis, or clubbing. + 2 pedal & radial pulses b/l.   NEUROLOGIC: Cranial nerves II through XII are intact. No focal Motor or sensory deficits appreciated b/l PSYCHIATRIC: The patient is alert and oriented x 3. Good affect.  SKIN: No obvious rash, lesion, or ulcer.   LABORATORY PANEL:   CBC  Recent Labs Lab 07/11/16 1237  WBC 8.1  HGB 13.4  HCT 38.4  PLT 183   ------------------------------------------------------------------------------------------------------------------  Chemistries   Recent Labs Lab 07/11/16 1237  NA 137  K 3.5  CL 104  CO2 25  GLUCOSE 106*  BUN 9  CREATININE 0.94  CALCIUM 9.4   ------------------------------------------------------------------------------------------------------------------  Cardiac Enzymes  Recent Labs Lab 07/11/16 1615  TROPONINI <0.03    ------------------------------------------------------------------------------------------------------------------  RADIOLOGY:  Dg Chest 2 View  Result Date: 07/11/2016 CLINICAL DATA:  Chest pain. EXAM: CHEST  2 VIEW COMPARISON:  01/16/2015 FINDINGS: Cardiomediastinal silhouette is normal. There is no evidence of focal airspace consolidation, pleural effusion or pneumothorax. Osseous structures are without acute abnormality. Soft tissues are grossly normal. IMPRESSION: No active cardiopulmonary disease. Electronically Signed   By: Fidela Salisbury M.D.   On: 07/11/2016 12:59   Ct Angio Chest Pe W And/or Wo Contrast  Result Date: 07/11/2016 CLINICAL DATA:  Chest pain radiating to patient's left arm. EXAM: CT ANGIOGRAPHY CHEST WITH CONTRAST TECHNIQUE: Multidetector CT imaging of the chest was performed using the standard protocol during bolus administration of intravenous contrast. Multiplanar CT image reconstructions and MIPs were obtained to evaluate the vascular anatomy. CONTRAST:  75 cc Isovue 370 intravenously. COMPARISON:  Chest radiograph 07/11/2016 FINDINGS: Cardiovascular: Satisfactory opacification of the pulmonary arteries to the segmental level. No evidence of pulmonary embolism. Normal heart size. No pericardial effusion. Mediastinum/Nodes: No enlarged mediastinal, hilar, or axillary lymph nodes. Thyroid gland, trachea, and esophagus demonstrate no significant findings. Lungs/Pleura: Lungs are clear. No pleural effusion or pneumothorax. Upper Abdomen: No acute abnormality. Eight mm calculus in the dependent portion of the gallbladder. Musculoskeletal: No chest wall abnormality. No acute or significant osseous findings. Review of the MIP images confirms the above findings. IMPRESSION: No evidence of pulmonary embolus or other  vascular abnormality. Normal appearance of the lungs. Cholelithiasis. Electronically Signed   By: Fidela Salisbury M.D.   On: 07/11/2016 18:14     IMPRESSION  AND PLAN:   * Chest pain Admit to telemetry. Repeat troponin. Scheduled for stress test in the morning. Aspirin. Consult cardiology if abnormal stress test.  * Hypertension Continue home medication  * DVT prophylaxis with Lovenox  All the records are reviewed and case discussed with ED provider. Management plans discussed with the patient, family and they are in agreement.  CODE STATUS: FULL CODE  TOTAL TIME TAKING CARE OF THIS PATIENT: 40 minutes.   Hillary Bow R M.D on 07/11/2016 at 7:44 PM  Between 7am to 6pm - Pager - [phone]  After 6pm go to [email or web address] - password [email or web address] [email or web address]  Office  [phone]  CC: Primary care physician; CAMPBELL, Creola Corn, MD (Inactive)  Note: This dictation was prepared with Dragon dictation along with smaller phrase technology. Any transcriptional errors that result from this process are unintentional.

## 2016-07-11 NOTE — ED Notes (Signed)
Patient sleeps with CPAP machine. Patients husband will be bringing it from home for her.

## 2016-07-11 NOTE — ED Triage Notes (Signed)
Pt presents to ED with spouse c/o chest pain that radiates to left arm; intermittent left arm numbness. Chest pain started this morning around 10 30 am. " It feels like a sharp pain when somebody hits you". Pt with dyspnea at rest, mouth dryness , pain 9/10

## 2016-07-11 NOTE — ED Notes (Signed)
Attempted to call report to the floor.  RN unavailable at this time and will return my call.  

## 2016-07-11 NOTE — ED Notes (Signed)
Informed RN bed ready 

## 2016-07-12 ENCOUNTER — Observation Stay: Payer: Medicare HMO

## 2016-07-12 ENCOUNTER — Encounter: Payer: Self-pay | Admitting: Radiology

## 2016-07-12 DIAGNOSIS — R0789 Other chest pain: Secondary | ICD-10-CM | POA: Diagnosis not present

## 2016-07-12 LAB — NM MYOCAR MULTI W/SPECT W/WALL MOTION / EF
CHL CUP NUCLEAR SSS: 3
LV dias vol: 138 mL (ref 46–106)
LV sys vol: 70 mL
NUC STRESS TID: 1.08
SDS: 5
SRS: 2

## 2016-07-12 LAB — CBC
HCT: 35.8 % (ref 35.0–47.0)
Hemoglobin: 12.9 g/dL (ref 12.0–16.0)
MCH: 29 pg (ref 26.0–34.0)
MCHC: 35.9 g/dL (ref 32.0–36.0)
MCV: 80.6 fL (ref 80.0–100.0)
PLATELETS: 187 10*3/uL (ref 150–440)
RBC: 4.44 MIL/uL (ref 3.80–5.20)
RDW: 14 % (ref 11.5–14.5)
WBC: 8.5 10*3/uL (ref 3.6–11.0)

## 2016-07-12 LAB — BASIC METABOLIC PANEL
Anion gap: 6 (ref 5–15)
BUN: 10 mg/dL (ref 6–20)
CALCIUM: 9 mg/dL (ref 8.9–10.3)
CO2: 26 mmol/L (ref 22–32)
CREATININE: 0.98 mg/dL (ref 0.44–1.00)
Chloride: 106 mmol/L (ref 101–111)
GFR calc non Af Amer: >60 mL/min (ref >60)
Glucose, Bld: 129 mg/dL — ABNORMAL HIGH (ref 65–99)
Potassium: 3.1 mmol/L — ABNORMAL LOW (ref 3.5–5.1)
SODIUM: 138 mmol/L (ref 135–145)

## 2016-07-12 LAB — TROPONIN I: Troponin I: <0.03 ng/mL (ref <0.03)

## 2016-07-12 MED ORDER — REGADENOSON 0.4 MG/5ML IV SOLN
0.4000 mg | Freq: Once | INTRAVENOUS | Status: AC
  Administered 2016-07-12: 0.4 mg via INTRAVENOUS

## 2016-07-12 MED ORDER — ASPIRIN 81 MG PO TBEC: 81.0000 mg | DELAYED_RELEASE_TABLET | Freq: Every day | ORAL | 2 refills | Status: DC

## 2016-07-12 MED ORDER — TECHNETIUM TC 99M TETROFOSMIN IV KIT
30.0000 | PACK | Freq: Once | INTRAVENOUS | Status: AC | PRN
  Administered 2016-07-12: 32.155 via INTRAVENOUS

## 2016-07-12 MED ORDER — TRAMADOL HCL 50 MG PO TABS: 50.0000 mg | ORAL_TABLET | Freq: Four times a day (QID) | ORAL | 0 refills | Status: DC | PRN

## 2016-07-12 MED ORDER — TECHNETIUM TC 99M TETROFOSMIN IV KIT
13.1220 | PACK | Freq: Once | INTRAVENOUS | Status: AC | PRN
  Administered 2016-07-12: 13.122 via INTRAVENOUS

## 2016-07-12 NOTE — Progress Notes (Addendum)
A&O. Admitted from via the ED for chest pain. Independent. Medicated for chest pain at the beginning of shift. Slept well with cpap through the night. Visitor at side. For stress test this AM. Instructed patient of NPO instructions. Verbalized understanding.

## 2016-07-12 NOTE — Progress Notes (Signed)
Pt. Discharged to home via wc. Discharge instructions and medication regimen reviewed at bedside with patient. Pt. verbalizes understanding of instructions and medication regimen. Prescription for tramadol included with d/c papers, aspirin rx sent to pharmacy, pt aware. Patient assessment unchanged from this morning. TELE and IV discontinued per policy.

## 2016-07-13 NOTE — Discharge Summary (Signed)
Heather Sweeney at [place] NAME: Heather Sweeney    MR#:  [id number]  DATE OF BIRTH:  09-26-1966  DATE OF ADMISSION:  07/11/2016   ADMITTING PHYSICIAN: Hillary Bow, MD  DATE OF DISCHARGE: 07/12/2016  2:00 PM  PRIMARY CARE PHYSICIAN: CAMPBELL, Creola Corn, MD (Inactive)   ADMISSION DIAGNOSIS:   Chest pain [R07.9] Chest pain, unspecified type [R07.9]  DISCHARGE DIAGNOSIS:   Active Problems:   Chest pain   SECONDARY DIAGNOSIS:   Past Medical History:  Diagnosis Date  . Allergic rhinitis   . Bipolar disorder ([place])   . CHF (congestive heart failure) ([place])   . Depression   . GERD (gastroesophageal reflux disease)   . Hypertension     HOSPITAL COURSE:   50 year old female with past medical history significant for hypertension, bipolar disorder with depression, GERD presents to the hospital secondary to atypical chest pain.  #1 chest pain-sounded typical for angina, however her troponins were negative 3 -Stress test was done and did not show any evidence of ischemic anemia. Her risk scan. -Discharge with tramadol for pain relief and outpatient follow-up recommended. -CT of the chest was done on admission did not show any pulmonary embolism or lung issues -Also stress and anxiety causing some chest tightness.  #2 hypertension-continue Norvasc and losartan  #3 bipolar with depression-continue home medications including Prolixin, cogentin  #4 Hypokalemia- replaced  Discharge home.  DISCHARGE CONDITIONS:   Stable  CONSULTS OBTAINED:   Treatment Team:  Gladstone Lighter, MD  DRUG ALLERGIES:   Allergies  Allergen Reactions  . Cephalexin Swelling  . Lisinopril Other (See Comments)    headache  . Hctz [Hydrochlorothiazide] Rash  . Penicillins Rash   DISCHARGE MEDICATIONS:     Medication List    TAKE these medications   amLODipine 5 MG tablet Commonly known as:  NORVASC Take 10 mg by mouth daily.   aspirin 81  MG EC tablet Take 1 tablet (81 mg total) by mouth daily.   benztropine 1 MG tablet Commonly known as:  COGENTIN Take 1 mg by mouth 2 (two) times daily.   ferrous sulfate 325 (65 FE) MG tablet Take 325 mg by mouth daily with breakfast.   fluPHENAZine 2.5 MG tablet Commonly known as:  PROLIXIN Take 2.5 mg by mouth daily.   fluticasone 50 MCG/ACT nasal spray Commonly known as:  FLONASE Place 2 sprays into both nostrils daily.   losartan 100 MG tablet Commonly known as:  COZAAR Take 100 mg by mouth daily.   multivitamin tablet Take 1 tablet by mouth daily.   pantoprazole 40 MG tablet Commonly known as:  PROTONIX Take 40 mg by mouth daily.   solifenacin 10 MG tablet Commonly known as:  VESICARE Take 10 mg by mouth daily.   traMADol 50 MG tablet Commonly known as:  ULTRAM Take 1 tablet (50 mg total) by mouth every 6 (six) hours as needed.   vitamin B-12 1000 MCG tablet Commonly known as:  CYANOCOBALAMIN Take 1,000 mcg by mouth daily.        DISCHARGE INSTRUCTIONS:   1. PCP f/u in 1 week  DIET:   Cardiac diet  ACTIVITY:   Activity as tolerated  OXYGEN:   Home Oxygen: No.  Oxygen Delivery: room air  DISCHARGE LOCATION:   home   If you experience worsening of your admission symptoms, develop shortness of breath, life threatening emergency, suicidal or homicidal thoughts you must seek medical attention immediately by calling 911 or  calling your MD immediately  if symptoms less severe.  You Must read complete instructions/literature along with all the possible adverse reactions/side effects for all the Medicines you take and that have been prescribed to you. Take any new Medicines after you have completely understood and accpet all the possible adverse reactions/side effects.   Please note  You were cared for by a hospitalist during your hospital stay. If you have any questions about your discharge medications or the care you received while you were in the  hospital after you are discharged, you can call the unit and asked to speak with the hospitalist on call if the hospitalist that took care of you is not available. Once you are discharged, your primary care physician will handle any further medical issues. Please note that NO REFILLS for any discharge medications will be authorized once you are discharged, as it is imperative that you return to your primary care physician (or establish a relationship with a primary care physician if you do not have one) for your aftercare needs so that they can reassess your need for medications and monitor your lab values.    On the day of Discharge:  VITAL SIGNS:   Blood pressure (!) 154/77, pulse 88, temperature 98.3 F (36.8 C), temperature source Oral, resp. rate (!) 24, height 5\' 3"  (1.6 m), weight 125.3 kg (276 lb 4.8 oz), SpO2 100 %.  PHYSICAL EXAMINATION:    GENERAL:  50 y.o.-year-old patient lying in the bed with no acute distress.  EYES: Pupils equal, round, reactive to light and accommodation. No scleral icterus. Extraocular muscles intact.  HEENT: Head atraumatic, normocephalic. Oropharynx and nasopharynx clear.  NECK:  Supple, no jugular venous distention. No thyroid enlargement, no tenderness.  LUNGS: Normal breath sounds bilaterally, no wheezing, rales,rhonchi or crepitation. No use of accessory muscles of respiration.  CARDIOVASCULAR: S1, S2 normal. No murmurs, rubs, or gallops. Mild tenderness of palpation on the chest wall. ABDOMEN: Soft, non-tender, non-distended. Bowel sounds present. No organomegaly or mass.  EXTREMITIES: No pedal edema, cyanosis, or clubbing.  NEUROLOGIC: Cranial nerves II through XII are intact. Muscle strength 5/5 in all extremities. Sensation intact. Gait not checked.  PSYCHIATRIC: The patient is alert and oriented x 3.  SKIN: No obvious rash, lesion, or ulcer.   DATA REVIEW:   CBC  Recent Labs Lab 07/12/16 0303  WBC 8.5  HGB 12.9  HCT 35.8  PLT 187     Chemistries   Recent Labs Lab 07/12/16 0303  NA 138  K 3.1*  CL 106  CO2 26  GLUCOSE 129*  BUN 10  CREATININE 0.98  CALCIUM 9.0     Microbiology Results  Results for orders placed or performed in visit on 09/09/14  Influenza A&B Antigens [facility])     Status: None   Collection Time: 09/09/14  4:36 PM  Result Value Ref Range Status   Micro Text Report   Final       SOURCE: nasal swab    COMMENT                   NEGATIVE FOR INFLUENZA A (ANTIGEN ABSENT)   COMMENT                   NEGATIVE FOR INFLUENZA B (ANTIGEN ABSENT)   ANTIBIOTIC  RADIOLOGY:  No results found.   Management plans discussed with the patient, family and they are in agreement.  CODE STATUS:  Code Status History    Date Active Date Inactive Code Status Order ID Comments User Context   07/11/2016  7:43 PM 07/12/2016  5:15 PM Full Code [id number]  Hillary Bow, MD ED      TOTAL TIME TAKING CARE OF THIS PATIENT: 37 minutes.    Carina Chaplin M.D on 07/13/2016 at 11:40 AM  Between 7am to 6pm - Pager - [phone]  After 6pm go to [email or web address] - Proofreader  [facility]  Office  [phone]  CC: Primary care physician; CAMPBELL, Creola Corn, MD (Inactive)   Note: This dictation was prepared with Dragon dictation along with smaller phrase technology. Any transcriptional errors that result from this process are unintentional.

## 2016-08-07 DIAGNOSIS — Z6841 Body Mass Index (BMI) 40.0 and over, adult: Secondary | ICD-10-CM

## 2016-08-07 DIAGNOSIS — R002 Palpitations: Secondary | ICD-10-CM | POA: Insufficient documentation

## 2016-09-24 DIAGNOSIS — I493 Ventricular premature depolarization: Secondary | ICD-10-CM

## 2016-09-24 HISTORY — DX: Ventricular premature depolarization: I49.3

## 2016-10-21 ENCOUNTER — Ambulatory Visit
Admission: RE | Admit: 2016-10-21 | Discharge: 2016-10-21 | Disposition: A | Discharge status: Home / Self Care | Payer: Medicare HMO | Source: Ambulatory Visit | Attending: Internal Medicine | Admitting: Internal Medicine

## 2016-10-21 ENCOUNTER — Other Ambulatory Visit: Payer: Self-pay | Admitting: Internal Medicine

## 2016-10-21 ENCOUNTER — Encounter (INDEPENDENT_AMBULATORY_CARE_PROVIDER_SITE_OTHER): Payer: Self-pay

## 2016-10-21 DIAGNOSIS — R609 Edema, unspecified: Secondary | ICD-10-CM | POA: Diagnosis present

## 2016-10-21 DIAGNOSIS — M79604 Pain in right leg: Secondary | ICD-10-CM

## 2016-10-24 ENCOUNTER — Other Ambulatory Visit: Payer: Self-pay | Admitting: Internal Medicine

## 2016-10-24 DIAGNOSIS — R600 Localized edema: Secondary | ICD-10-CM

## 2016-10-29 ENCOUNTER — Ambulatory Visit: Payer: Commercial Managed Care - HMO

## 2016-11-03 DIAGNOSIS — G5603 Carpal tunnel syndrome, bilateral upper limbs: Secondary | ICD-10-CM | POA: Insufficient documentation

## 2016-11-17 ENCOUNTER — Encounter: Payer: Self-pay | Admitting: Podiatry

## 2016-11-17 ENCOUNTER — Ambulatory Visit (INDEPENDENT_AMBULATORY_CARE_PROVIDER_SITE_OTHER): Payer: Commercial Managed Care - HMO | Admitting: Podiatry

## 2016-11-17 DIAGNOSIS — B351 Tinea unguium: Secondary | ICD-10-CM

## 2016-11-17 DIAGNOSIS — M79676 Pain in unspecified toe(s): Secondary | ICD-10-CM

## 2016-11-17 NOTE — Progress Notes (Signed)
   Subjective:    Patient ID: Heather Sweeney, female    DOB: [birth year], 51 y.o.   MRN: [id number]  HPIThis patient presents to the office with chief complaint of long thick nails both feet,  She says her previous doctor treated her with gabapentin and then referred her to this office.  She stopped the gabapentin when this appointment was made.  She states her nails are painful walking and wearing her shoes.  She presents for evaluation and treatment.    Review of Systems  All other systems reviewed and are negative.      Objective:   Physical Exam GENERAL APPEARANCE: Alert, conversant. Appropriately groomed. No acute distress.  VASCULAR: Pedal pulses are  palpable at  [facility] and PT bilateral.  Capillary refill time is immediate to all digits,  Normal temperature gradient.   NEUROLOGIC: sensation is normal to 5.07 monofilament at 5/5 sites bilateral.  Light touch is intact bilateral, Muscle strength normal.  MUSCULOSKELETAL: acceptable muscle strength, tone and stability bilateral.  Intrinsic muscluature intact bilateral.  Mild  HAV  B/L  DERMATOLOGIC: skin color, texture, and turgor are within normal limits.  No preulcerative lesions or ulcers  are seen, no interdigital maceration noted.  No open lesions present.   No drainage noted.  NAILS  Thick disfigured discolored nails both feet.         Assessment & Plan:  Pain due to onychomycosis  IE  Debride nails.  Send sample to lab to determine if fungus.  RTC 3 months.   Gardiner Barefoot DPM

## 2017-01-13 ENCOUNTER — Other Ambulatory Visit: Payer: Self-pay | Admitting: Internal Medicine

## 2017-01-13 DIAGNOSIS — Z1231 Encounter for screening mammogram for malignant neoplasm of breast: Secondary | ICD-10-CM

## 2017-02-09 ENCOUNTER — Ambulatory Visit (INDEPENDENT_AMBULATORY_CARE_PROVIDER_SITE_OTHER): Payer: Medicare HMO | Admitting: Podiatry

## 2017-02-09 DIAGNOSIS — B351 Tinea unguium: Secondary | ICD-10-CM | POA: Diagnosis not present

## 2017-02-09 DIAGNOSIS — M79676 Pain in unspecified toe(s): Secondary | ICD-10-CM

## 2017-02-09 NOTE — Progress Notes (Addendum)
   Subjective:    Patient ID: Heather Sweeney, female    DOB: December 02, 1965, 51 y.o.   MRN: [id number]  HPIThis patient presents to the office with chief complaint of long thick nails both feet,  .  She presents for evaluation of her lab results Her lab results are onychomycosis.  She desires continued evaluation and treatment.    Review of Systems  All other systems reviewed and are negative.      Objective:   Physical Exam GENERAL APPEARANCE: Alert, conversant. Appropriately groomed. No acute distress.  VASCULAR: Pedal pulses are  palpable at  [facility] and PT bilateral.  Capillary refill time is immediate to all digits,  Normal temperature gradient.   NEUROLOGIC: sensation is normal to 5.07 monofilament at 5/5 sites bilateral.  Light touch is intact bilateral, Muscle strength normal.  MUSCULOSKELETAL: acceptable muscle strength, tone and stability bilateral.  Intrinsic muscluature intact bilateral.  Mild  HAV  B/L  DERMATOLOGIC: skin color, texture, and turgor are within normal limits.  No preulcerative lesions or ulcers  are seen, no interdigital maceration noted.  No open lesions present.   No drainage noted.  NAILS  Thick disfigured discolored nails both feet.         Assessment & Plan:  Pain due to onychomycosis  IE  Debride nails.  Discussed topical treatment , lamisil treatment and laser treatment with patient.  Patient says there is limited pain and she is diabetic  We decided to treat her nails with topical fungal medicine. RTC 6 weeks.   Gardiner Barefoot DPM

## 2017-02-09 NOTE — Addendum Note (Signed)
Addended by: Gardiner Barefoot on: 02/09/2017 09:23 AM   Modules accepted: Level of Service

## 2017-02-18 ENCOUNTER — Ambulatory Visit
Admission: RE | Admit: 2017-02-18 | Discharge: 2017-02-18 | Disposition: A | Discharge status: Home / Self Care | Payer: Medicare HMO | Source: Ambulatory Visit | Attending: Internal Medicine | Admitting: Internal Medicine

## 2017-02-18 DIAGNOSIS — Z1231 Encounter for screening mammogram for malignant neoplasm of breast: Secondary | ICD-10-CM | POA: Insufficient documentation

## 2017-03-23 ENCOUNTER — Ambulatory Visit: Payer: Medicare HMO | Admitting: Podiatry

## 2017-06-25 LAB — HM PAP SMEAR: HM PAP: NORMAL

## 2017-07-07 ENCOUNTER — Encounter: Payer: Self-pay | Admitting: Medical Oncology

## 2017-07-07 ENCOUNTER — Inpatient Hospital Stay
Admission: EM | Admit: 2017-07-07 | Discharge: 2017-07-08 | DRG: 916 | Disposition: A | Discharge status: Home / Self Care | Payer: Medicare HMO | Attending: Internal Medicine | Admitting: Internal Medicine

## 2017-07-07 DIAGNOSIS — I110 Hypertensive heart disease with heart failure: Secondary | ICD-10-CM | POA: Diagnosis present

## 2017-07-07 DIAGNOSIS — T50B95A Adverse effect of other viral vaccines, initial encounter: Secondary | ICD-10-CM | POA: Diagnosis present

## 2017-07-07 DIAGNOSIS — Z79899 Other long term (current) drug therapy: Secondary | ICD-10-CM | POA: Diagnosis not present

## 2017-07-07 DIAGNOSIS — Z794 Long term (current) use of insulin: Secondary | ICD-10-CM

## 2017-07-07 DIAGNOSIS — Z6841 Body Mass Index (BMI) 40.0 and over, adult: Secondary | ICD-10-CM

## 2017-07-07 DIAGNOSIS — Y92009 Unspecified place in unspecified non-institutional (private) residence as the place of occurrence of the external cause: Secondary | ICD-10-CM | POA: Diagnosis not present

## 2017-07-07 DIAGNOSIS — Z7982 Long term (current) use of aspirin: Secondary | ICD-10-CM | POA: Diagnosis not present

## 2017-07-07 DIAGNOSIS — E119 Type 2 diabetes mellitus without complications: Secondary | ICD-10-CM | POA: Diagnosis present

## 2017-07-07 DIAGNOSIS — Z880 Allergy status to penicillin: Secondary | ICD-10-CM | POA: Diagnosis not present

## 2017-07-07 DIAGNOSIS — G4733 Obstructive sleep apnea (adult) (pediatric): Secondary | ICD-10-CM | POA: Diagnosis present

## 2017-07-07 DIAGNOSIS — E876 Hypokalemia: Secondary | ICD-10-CM | POA: Diagnosis present

## 2017-07-07 DIAGNOSIS — K219 Gastro-esophageal reflux disease without esophagitis: Secondary | ICD-10-CM | POA: Diagnosis present

## 2017-07-07 DIAGNOSIS — F319 Bipolar disorder, unspecified: Secondary | ICD-10-CM | POA: Diagnosis present

## 2017-07-07 DIAGNOSIS — Z8249 Family history of ischemic heart disease and other diseases of the circulatory system: Secondary | ICD-10-CM

## 2017-07-07 DIAGNOSIS — T783XXA Angioneurotic edema, initial encounter: Secondary | ICD-10-CM | POA: Diagnosis present

## 2017-07-07 DIAGNOSIS — Z888 Allergy status to other drugs, medicaments and biological substances status: Secondary | ICD-10-CM

## 2017-07-07 DIAGNOSIS — I509 Heart failure, unspecified: Secondary | ICD-10-CM | POA: Diagnosis present

## 2017-07-07 LAB — CBC WITH DIFFERENTIAL/PLATELET
BASOS ABS: 0.1 10*3/uL (ref 0–0.1)
BASOS PCT: 1 %
EOS ABS: 0.2 10*3/uL (ref 0–0.7)
Eosinophils Relative: 3 %
HCT: 33.6 % — ABNORMAL LOW (ref 35.0–47.0)
Hemoglobin: 12 g/dL (ref 12.0–16.0)
LYMPHS ABS: 1.6 10*3/uL (ref 1.0–3.6)
Lymphocytes Relative: 23 %
MCH: 29.4 pg (ref 26.0–34.0)
MCHC: 35.7 g/dL (ref 32.0–36.0)
MCV: 82.4 fL (ref 80.0–100.0)
Monocytes Absolute: 0.6 10*3/uL (ref 0.2–0.9)
Monocytes Relative: 9 %
Neutro Abs: 4.4 10*3/uL (ref 1.4–6.5)
Neutrophils Relative %: 64 %
Platelets: 209 10*3/uL (ref 150–440)
RBC: 4.08 MIL/uL (ref 3.80–5.20)
RDW: 14 % (ref 11.5–14.5)
WBC: 6.8 10*3/uL (ref 3.6–11.0)

## 2017-07-07 LAB — COMPREHENSIVE METABOLIC PANEL
ALBUMIN: 4 g/dL (ref 3.5–5.0)
ALK PHOS: 76 U/L (ref 38–126)
ALT: 22 U/L (ref 14–54)
AST: 18 U/L (ref 15–41)
Anion gap: 7 (ref 5–15)
BUN: 12 mg/dL (ref 6–20)
CALCIUM: 9.1 mg/dL (ref 8.9–10.3)
CHLORIDE: 104 mmol/L (ref 101–111)
CO2: 28 mmol/L (ref 22–32)
CREATININE: 1.1 mg/dL — AB (ref 0.44–1.00)
GFR calc Af Amer: >60 mL/min (ref >60)
GFR calc non Af Amer: 58 mL/min — ABNORMAL LOW (ref >60)
GLUCOSE: 100 mg/dL — AB (ref 65–99)
Potassium: 3.4 mmol/L — ABNORMAL LOW (ref 3.5–5.1)
SODIUM: 139 mmol/L (ref 135–145)
Total Bilirubin: 0.4 mg/dL (ref 0.3–1.2)
Total Protein: 7.4 g/dL (ref 6.5–8.1)

## 2017-07-07 LAB — TROPONIN I: Troponin I: <0.03 ng/mL (ref <0.03)

## 2017-07-07 LAB — GLUCOSE, CAPILLARY
Glucose-Capillary: 132 mg/dL — ABNORMAL HIGH (ref 65–99)
Glucose-Capillary: 185 mg/dL — ABNORMAL HIGH (ref 65–99)

## 2017-07-07 MED ORDER — PANTOPRAZOLE SODIUM 40 MG PO TBEC
40.0000 mg | DELAYED_RELEASE_TABLET | Freq: Every day | ORAL | Status: DC
  Administered 2017-07-07 – 2017-07-08 (×2): 40 mg via ORAL
  Filled 2017-07-07 (×2): qty 1

## 2017-07-07 MED ORDER — ACETAMINOPHEN 650 MG RE SUPP: 650.0000 mg | Freq: Four times a day (QID) | RECTAL | Status: DC | PRN

## 2017-07-07 MED ORDER — INSULIN ASPART 100 UNIT/ML ~~LOC~~ SOLN
0.0000 [IU] | Freq: Three times a day (TID) | SUBCUTANEOUS | Status: DC
  Administered 2017-07-07 – 2017-07-08 (×2): 2 [IU] via SUBCUTANEOUS
  Filled 2017-07-07 (×2): qty 1

## 2017-07-07 MED ORDER — BENZTROPINE MESYLATE 1 MG PO TABS
1.0000 mg | ORAL_TABLET | Freq: Every day | ORAL | Status: DC | PRN
  Filled 2017-07-07: qty 1

## 2017-07-07 MED ORDER — FAMOTIDINE IN NACL 20-0.9 MG/50ML-% IV SOLN
20.0000 mg | Freq: Once | INTRAVENOUS | Status: AC
  Administered 2017-07-07: 20 mg via INTRAVENOUS
  Filled 2017-07-07: qty 50

## 2017-07-07 MED ORDER — ADULT MULTIVITAMIN W/MINERALS CH
1.0000 | ORAL_TABLET | Freq: Every day | ORAL | Status: DC
  Administered 2017-07-07 – 2017-07-08 (×2): 1 via ORAL
  Filled 2017-07-07 (×2): qty 1

## 2017-07-07 MED ORDER — HYDROCHLOROTHIAZIDE 12.5 MG PO CAPS
25.0000 mg | ORAL_CAPSULE | Freq: Every day | ORAL | Status: DC
  Administered 2017-07-07 – 2017-07-08 (×2): 25 mg via ORAL
  Filled 2017-07-07 (×2): qty 2

## 2017-07-07 MED ORDER — FLUPHENAZINE HCL 2.5 MG PO TABS
2.5000 mg | ORAL_TABLET | Freq: Every day | ORAL | Status: DC
  Administered 2017-07-07: 2.5 mg via ORAL
  Filled 2017-07-07 (×2): qty 1

## 2017-07-07 MED ORDER — TRAMADOL HCL 50 MG PO TABS: 50.0000 mg | ORAL_TABLET | Freq: Four times a day (QID) | ORAL | Status: DC | PRN

## 2017-07-07 MED ORDER — POTASSIUM CHLORIDE 10 MEQ/100ML IV SOLN
10.0000 meq | INTRAVENOUS | Status: AC
  Administered 2017-07-07 (×2): 10 meq via INTRAVENOUS
  Filled 2017-07-07 (×2): qty 100

## 2017-07-07 MED ORDER — ONDANSETRON HCL 4 MG PO TABS: 4.0000 mg | ORAL_TABLET | Freq: Four times a day (QID) | ORAL | Status: DC | PRN

## 2017-07-07 MED ORDER — OXYCODONE-ACETAMINOPHEN 5-325 MG PO TABS
1.0000 | ORAL_TABLET | ORAL | Status: DC | PRN
  Administered 2017-07-07: 1 via ORAL
  Filled 2017-07-07: qty 1

## 2017-07-07 MED ORDER — VITAMIN B-12 1000 MCG PO TABS
1000.0000 ug | ORAL_TABLET | Freq: Every day | ORAL | Status: DC
  Administered 2017-07-07 – 2017-07-08 (×2): 1000 ug via ORAL
  Filled 2017-07-07 (×2): qty 1

## 2017-07-07 MED ORDER — ALBUTEROL SULFATE (2.5 MG/3ML) 0.083% IN NEBU: 2.5000 mg | INHALATION_SOLUTION | RESPIRATORY_TRACT | Status: DC | PRN

## 2017-07-07 MED ORDER — GABAPENTIN 300 MG PO CAPS
600.0000 mg | ORAL_CAPSULE | Freq: Every day | ORAL | Status: DC
  Administered 2017-07-07: 600 mg via ORAL
  Filled 2017-07-07: qty 2

## 2017-07-07 MED ORDER — METHYLPREDNISOLONE SODIUM SUCC 125 MG IJ SOLR
125.0000 mg | Freq: Once | INTRAMUSCULAR | Status: AC
  Administered 2017-07-07: 125 mg via INTRAVENOUS
  Filled 2017-07-07 (×3): qty 2

## 2017-07-07 MED ORDER — LIRAGLUTIDE 18 MG/3ML ~~LOC~~ SOPN: 1.8000 mg | PEN_INJECTOR | Freq: Every day | SUBCUTANEOUS | Status: DC

## 2017-07-07 MED ORDER — FAMOTIDINE IN NACL 20-0.9 MG/50ML-% IV SOLN
20.0000 mg | Freq: Two times a day (BID) | INTRAVENOUS | Status: DC
  Administered 2017-07-07 (×2): 20 mg via INTRAVENOUS
  Filled 2017-07-07 (×4): qty 50

## 2017-07-07 MED ORDER — ASPIRIN EC 81 MG PO TBEC
81.0000 mg | DELAYED_RELEASE_TABLET | Freq: Every day | ORAL | Status: DC
  Administered 2017-07-07 – 2017-07-08 (×2): 81 mg via ORAL
  Filled 2017-07-07 (×2): qty 1

## 2017-07-07 MED ORDER — DIPHENHYDRAMINE HCL 50 MG/ML IJ SOLN
50.0000 mg | Freq: Once | INTRAMUSCULAR | Status: AC
  Administered 2017-07-07: 50 mg via INTRAVENOUS
  Filled 2017-07-07: qty 1

## 2017-07-07 MED ORDER — DIPHENHYDRAMINE HCL 25 MG PO CAPS
25.0000 mg | ORAL_CAPSULE | Freq: Four times a day (QID) | ORAL | Status: DC
  Administered 2017-07-07 – 2017-07-08 (×3): 25 mg via ORAL
  Filled 2017-07-07 (×3): qty 1

## 2017-07-07 MED ORDER — ENOXAPARIN SODIUM 40 MG/0.4ML ~~LOC~~ SOLN
40.0000 mg | Freq: Two times a day (BID) | SUBCUTANEOUS | Status: DC
  Administered 2017-07-07 (×2): 40 mg via SUBCUTANEOUS
  Filled 2017-07-07 (×2): qty 0.4

## 2017-07-07 MED ORDER — SODIUM CHLORIDE 0.9% FLUSH
3.0000 mL | Freq: Two times a day (BID) | INTRAVENOUS | Status: DC
  Administered 2017-07-07 – 2017-07-08 (×2): 3 mL via INTRAVENOUS

## 2017-07-07 MED ORDER — METHYLPREDNISOLONE SODIUM SUCC 125 MG IJ SOLR
60.0000 mg | Freq: Three times a day (TID) | INTRAMUSCULAR | Status: DC
  Administered 2017-07-07 – 2017-07-08 (×3): 60 mg via INTRAVENOUS
  Filled 2017-07-07 (×3): qty 2

## 2017-07-07 MED ORDER — AMLODIPINE BESYLATE 5 MG PO TABS
5.0000 mg | ORAL_TABLET | Freq: Every day | ORAL | Status: DC
  Administered 2017-07-07 – 2017-07-08 (×2): 5 mg via ORAL
  Filled 2017-07-07 (×2): qty 1

## 2017-07-07 MED ORDER — SODIUM CHLORIDE 0.9 % IV SOLN
Freq: Once | INTRAVENOUS | Status: AC
  Administered 2017-07-07: 08:00:00 via INTRAVENOUS

## 2017-07-07 MED ORDER — ONDANSETRON HCL 4 MG/2ML IJ SOLN: 4.0000 mg | Freq: Four times a day (QID) | INTRAMUSCULAR | Status: DC | PRN

## 2017-07-07 MED ORDER — DARIFENACIN HYDROBROMIDE ER 7.5 MG PO TB24
7.5000 mg | ORAL_TABLET | Freq: Every day | ORAL | Status: DC
  Administered 2017-07-07 – 2017-07-08 (×2): 7.5 mg via ORAL
  Filled 2017-07-07 (×2): qty 1

## 2017-07-07 MED ORDER — ACETAMINOPHEN 325 MG PO TABS: 650.0000 mg | ORAL_TABLET | Freq: Four times a day (QID) | ORAL | Status: DC | PRN

## 2017-07-07 MED ORDER — FLUTICASONE PROPIONATE 50 MCG/ACT NA SUSP
2.0000 | Freq: Every day | NASAL | Status: DC
Start: 2017-07-07 — End: 2017-07-08
  Administered 2017-07-07 – 2017-07-08 (×2): 2 via NASAL
  Filled 2017-07-07: qty 16

## 2017-07-07 MED ORDER — EPINEPHRINE 0.3 MG/0.3ML IJ SOAJ
0.3000 mg | Freq: Once | INTRAMUSCULAR | Status: AC
  Administered 2017-07-07: 0.3 mg via INTRAMUSCULAR
  Filled 2017-07-07: qty 0.3

## 2017-07-07 MED ORDER — FLUOXETINE HCL 20 MG PO CAPS
20.0000 mg | ORAL_CAPSULE | Freq: Every day | ORAL | Status: DC
  Administered 2017-07-07 – 2017-07-08 (×2): 20 mg via ORAL
  Filled 2017-07-07 (×2): qty 1

## 2017-07-07 MED ORDER — MELATONIN 5 MG PO TABS
1.0000 | ORAL_TABLET | Freq: Every day | ORAL | Status: DC
  Administered 2017-07-07: 5 mg via ORAL
  Filled 2017-07-07 (×2): qty 1

## 2017-07-07 MED ORDER — INSULIN ASPART 100 UNIT/ML ~~LOC~~ SOLN: 0.0000 [IU] | Freq: Every day | SUBCUTANEOUS | Status: DC

## 2017-07-07 NOTE — Progress Notes (Signed)
Pharmacist - Prescriber Communication  Enoxaparin dose modified to 40 mg subcutaneously BID due to BMI greater than 40.  Remas Sobel A. [place], [place].D., BCPS Clinical Pharmacist 07/07/2017 13:58

## 2017-07-07 NOTE — ED Notes (Signed)
Pt states last night/ early this AM she began to have tongue swelling and difficulty swallowing and talking, states yesterday she had a flu shot in her left arm, swelling and hardness to left arm at injection site, EDP at bedside upon pts arrival, pts voice hoarse and full, tongue swollen, pt maintaining airway at this time, on cardiac montior and o2 sat, difficult airway cart at bedside

## 2017-07-07 NOTE — ED Notes (Signed)
Pt given ice chips, per request.

## 2017-07-07 NOTE — ED Notes (Signed)
Pt resting in bed, pt in no distress, states feeling better

## 2017-07-07 NOTE — ED Provider Notes (Signed)
[facility] Emergency Department Provider Note       Time seen: ----------------------------------------- 7:55 AM on 07/07/2017 -----------------------------------------     I have reviewed the triage vital signs and the nursing notes.   HISTORY   Chief Complaint Allergic Reaction    HPI Heather Sweeney is a 51 y.o. female who presents to the ED for swelling of her tongue after receiving the flu shot last night. Patient states she noticed the swelling and has been having difficulty breathing and swallowing. She has never had a reaction to anything in the past, states she first noticed the swelling starting around 2 AM. she denies any other changes in her medications or recent illness.   Past Medical History:  Diagnosis Date  . Allergic rhinitis   . Bipolar disorder ([place])   . CHF (congestive heart failure) ([place])   . Depression   . GERD (gastroesophageal reflux disease)   . Hypertension     Patient Active Problem List   Diagnosis Date Noted  . Bilateral carpal tunnel syndrome 11/03/2016  . Frequent PVCs 09/24/2016  . Morbid obesity with BMI of 50.0-59.9, adult ([place]) 08/07/2016  . Palpitations 08/07/2016  . Chest pain 07/11/2016  . Benign essential HTN 07/10/2016  . OSA (obstructive sleep apnea) 07/10/2016    Past Surgical History:  Procedure Laterality Date  . ABDOMINAL HYSTERECTOMY  07/08/2011    Allergies Cephalexin; Hctz [hydrochlorothiazide]; Penicillins; and Lisinopril  Social History Social History  Substance Use Topics  . Smoking status: Never Smoker  . Smokeless tobacco: Never Used  . Alcohol use No    Review of Systems Constitutional: Negative for fever. Eyes: Negative for vision changes ENT:  Positive for difficulty swallowing, tongue and throat swelling Cardiovascular: Negative for chest pain. Respiratory: Negative for shortness of breath. Gastrointestinal: Negative for abdominal pain, vomiting and  diarrhea. Genitourinary: Negative for dysuria. Musculoskeletal: Negative for back pain. Skin: Negative for rash. Neurological: Negative for headaches, focal weakness or numbness.  All systems negative/normal/unremarkable except as stated in the HPI  ____________________________________________   PHYSICAL EXAM:  VITAL SIGNS: ED Triage Vitals [07/07/17 0753]  Enc Vitals Group     BP      Pulse      Resp      Temp      Temp src      SpO2      Weight 276 lb (125.2 kg)     Height      Head Circumference      Peak Flow      Pain Score      Pain Loc      Pain Edu?      Excl. in [place]?     Constitutional: Alert and oriented. no distress Eyes: Conjunctivae are normal. Normal extraocular movements. ENT   Head: Normocephalic and atraumatic.   Nose: No congestion/rhinnorhea.   Mouth/Throat: swelling of the tongue, mostly inferiorly. There does appear to be posterior pharyngeal swelling and swelling of the soft palate   Neck: muffled voice, mild edema is noted around the neck Cardiovascular: Normal rate, regular rhythm. No murmurs, rubs, or gallops. Respiratory: Normal respiratory effort without tachypnea nor retractions. Breath sounds are clear and equal bilaterally. No wheezes/rales/rhonchi. Gastrointestinal: Soft and nontender. Normal bowel sounds Musculoskeletal: Nontender with normal range of motion in extremities. No lower extremity tenderness nor edema. Neurologic:  Normal speech and language. No gross focal neurologic deficits are appreciated.  Skin:  Skin is warm, dry and intact. No rash noted, no urticaria  Psychiatric: Mood and affect are normal. Speech and behavior are normal.  ____________________________________________  ED COURSE:  Pertinent labs & imaging results that were available during my care of the patient were reviewed by me and considered in my medical decision making (see chart for details). Patient presents for angioedema, we will assess with  labs and imaging as indicated. patient with marked swelling of her tongue and some of her pharynx is well. She will need epinephrine as well as Benadryl, steroids, Pepcid and fluids. If she does not improve she will need ENT and possibly intubation. Clinical Course as of Jul 07 1126  Tue Jul 07, 2017  0831 patient reports some improvement in her symptoms  [JW]  0856 swelling has improved somewhat  [JW]    Clinical Course User Index [JW] Earleen Newport, MD   Procedures   CRITICAL CARE Performed by: Earleen Newport   Total critical care time: 30 minutes  Critical care time was exclusive of separately billable procedures and treating other patients.  Critical care was necessary to treat or prevent imminent or life-threatening deterioration.  Critical care was time spent personally by me on the following activities: development of treatment plan with patient and/or surrogate as well as nursing, discussions with consultants, evaluation of patient's response to treatment, examination of patient, obtaining history from patient or surrogate, ordering and performing treatments and interventions, ordering and review of laboratory studies, ordering and review of radiographic studies, pulse oximetry and re-evaluation of patient's condition.  ____________________________________________   LABS (pertinent positives/negatives)  Labs Reviewed  CBC WITH DIFFERENTIAL/PLATELET - Abnormal; Notable for the following:       Result Value   HCT 33.6 (*)    All other components within normal limits  COMPREHENSIVE METABOLIC PANEL - Abnormal; Notable for the following:    Potassium 3.4 (*)    Glucose, Bld 100 (*)    Creatinine, Ser 1.10 (*)    GFR calc non Af Amer 58 (*)    All other components within normal limits  TROPONIN I   ____________________________________________  FINAL ASSESSMENT AND PLAN  Angioedema   Plan: Patient's labs and imaging were dictated above. Patient had  presented for swelling of her tongue pharynx. She received epinephrine as well as Benadryl, Solu-Medrol and Pepcid. She does report improvement in her symptoms but she still has some swelling in the floor of her mouth is still edematous. I do not feel comfortable letting her go home, I will discuss with the hospitalist for admission.   Earleen Newport, MD   Note: This note was generated in part or whole with voice recognition software. Voice recognition is usually quite accurate but there are transcription errors that can and very often do occur. I apologize for any typographical errors that were not detected and corrected.     Earleen Newport, MD 07/07/17 1128

## 2017-07-07 NOTE — ED Triage Notes (Signed)
Pt reports yesterday she had the flu shot and last night she began having swelling to tongue. Pt reports difficulty breathing and swallowing.

## 2017-07-07 NOTE — H&P (Signed)
[place] at [place] NAME: Heather Sweeney    MR#:  [id number]  DATE OF BIRTH:  1966/07/28  DATE OF ADMISSION:  07/07/2017  PRIMARY CARE PHYSICIAN: McLean-Scocuzza, Nino Glow, MD   REQUESTING/REFERRING PHYSICIAN: Earleen Newport, MD  CHIEF COMPLAINT:   ALLERGIC REACTION And swelling of tongue HISTORY OF PRESENT ILLNESS:  Heather Sweeney  is a 51 y.o. female with a known history of GERD, hypertension, diabetes mellitus, obesity, obstructive sleep apnea, allergic reaction to lisinopril in the past has received flu shot yesterday and coming into the ED today for swelling of her tongue and difficulty breathing and swallowing. Patient first noticed the swelling of her tongue and lips at around 2 AM. Came into the ED, received EpiPen, solum Medrol, Pepcid and Benadryl and the hospitalist team is called to admit the patient. During my examination patient shortness of breath and swallowing function significantly improved and was able to swallow liquid diet  PAST MEDICAL HISTORY:   Past Medical History:  Diagnosis Date  . Allergic rhinitis   . Bipolar disorder ([place])   . CHF (congestive heart failure) ([place])   . Depression   . GERD (gastroesophageal reflux disease)   . Hypertension     PAST SURGICAL HISTOIRY:   Past Surgical History:  Procedure Laterality Date  . ABDOMINAL HYSTERECTOMY  07/08/2011    SOCIAL HISTORY:   Social History  Substance Use Topics  . Smoking status: Never Smoker  . Smokeless tobacco: Never Used  . Alcohol use No    FAMILY HISTORY:   Family History  Problem Relation Age of Onset  . Cancer Father   . Hypertension Father   . Heart failure Mother   . Hypertension Mother   . Breast cancer Sister 79    DRUG ALLERGIES:   Allergies  Allergen Reactions  . Cephalexin Swelling and Rash  . Hctz [Hydrochlorothiazide] Rash and Swelling    Mild she says  . Penicillins Rash    Has patient had a PCN  reaction causing immediate rash, facial/tongue/throat swelling, SOB or lightheadedness with hypotension: Yes Has patient had a PCN reaction causing severe rash involving mucus membranes or skin necrosis: No Has patient had a PCN reaction that required hospitalization: No Has patient had a PCN reaction occurring within the last 10 years: No If all of the above answers are "NO", then may proceed with Cephalosporin use.   Marland Kitchen Lisinopril Other (See Comments) and Hives    headache Low BP headache    REVIEW OF SYSTEMS:  CONSTITUTIONAL: No fever, fatigue or weakness.  EYES: No blurred or double vision.  EARS, NOSE, AND THROAT: No tinnitus or ear pain.  RESPIRATORY: No cough,Improved  shortness of breath and swallowing , denies wheezing or hemoptysis.  CARDIOVASCULAR: No chest pain, orthopnea, edema.  GASTROINTESTINAL: No nausea, vomiting, diarrhea or abdominal pain.  GENITOURINARY: No dysuria, hematuria.  ENDOCRINE: No polyuria, nocturia,  HEMATOLOGY: No anemia, easy bruising or bleeding SKIN: No rash or lesion. MUSCULOSKELETAL: No joint pain or arthritis.   NEUROLOGIC: No tingling, numbness, weakness.  PSYCHIATRY: No anxiety or depression.   MEDICATIONS AT HOME:   Prior to Admission medications   Medication Sig Start Date End Date Taking? Authorizing Provider  amLODipine (NORVASC) 5 MG tablet Take 5 mg by mouth daily.    Yes [provider]  aspirin EC 81 MG EC tablet Take 1 tablet (81 mg total) by mouth daily. 07/12/16  Yes Gladstone Lighter, MD  FLUoxetine [facility])  20 MG capsule Take 20 mg by mouth daily.   Yes [provider]  fluPHENAZine (PROLIXIN) 2.5 MG tablet Take 2.5 mg by mouth at bedtime.    Yes [provider]  gabapentin (NEURONTIN) 300 MG capsule Take 600 mg by mouth at bedtime.    Yes [provider]  hydrochlorothiazide (MICROZIDE) 12.5 MG capsule Take by mouth.   Yes [provider]  liraglutide (VICTOZA) 18 MG/3ML SOPN Inject  1.8 mg into the skin daily.   Yes [provider]  losartan (COZAAR) 100 MG tablet Take 100 mg by mouth daily.   Yes [provider]  Melatonin 5 MG TABS Take 1 tablet by mouth daily.    Yes [provider]  Multiple Vitamins-Minerals (MULTIVITAMIN GUMMIES WOMENS PO) Take 2 tablets by mouth daily.   Yes [provider]  pantoprazole (PROTONIX) 40 MG tablet Take 40 mg by mouth daily.   Yes [provider]  solifenacin (VESICARE) 10 MG tablet Take 10 mg by mouth daily.   Yes [provider]  vitamin B-12 (CYANOCOBALAMIN) 1000 MCG tablet Take 1,000 mcg by mouth daily.   Yes [provider]  benztropine (COGENTIN) 1 MG tablet Take 1 mg by mouth daily as needed.     [provider]  fluticasone (FLONASE) 50 MCG/ACT nasal spray Place 2 sprays into both nostrils daily.    [provider]  oxyCODONE-acetaminophen (PERCOCET/ROXICET) 5-325 MG tablet Take one to two tablets by mouth every four to six hours as needed for pain as needed 03/08/10   [provider]  traMADol (ULTRAM) 50 MG tablet Take 1 tablet (50 mg total) by mouth every 6 (six) hours as needed. Patient not taking: Reported on 07/07/2017 07/12/16   Gladstone Lighter, MD      VITAL SIGNS:  Blood pressure 128/70, pulse 91, temperature 98.1 F (36.7 C), temperature source Axillary, resp. rate (!) 21, weight 125.2 kg (276 lb), SpO2 94 %.  PHYSICAL EXAMINATION:  GENERAL:  51 y.o.-year-old patient lying in the bed with no acute distress.  EYES: Pupils equal, round, reactive to light and accommodation. No scleral icterus. Extraocular muscles intact.  HEENT: Head atraumatic, normocephalic. Tongue is edematous, partially visible uvula is midline  NECK:  Supple, no jugular venous distention. No thyroid enlargement, no tenderness.  LUNGS: Normal breath sounds bilaterally, no wheezing, rales,rhonchi or crepitation. No use of accessory muscles of respiration.   CARDIOVASCULAR: S1, S2 normal. No murmurs, rubs, or gallops.  ABDOMEN: Soft, nontender, nondistended. Bowel sounds present. No organomegaly or mass.  EXTREMITIES: No pedal edema, cyanosis, or clubbing.  NEUROLOGIC: Cranial nerves II through XII are intact. Muscle strength 5/5 in all extremities. Sensation intact. Gait not checked.  PSYCHIATRIC: The patient is alert and oriented x 3.  SKIN: No obvious rash, lesion, or ulcer.   LABORATORY PANEL:   CBC  Recent Labs Lab 07/07/17 0804  WBC 6.8  HGB 12.0  HCT 33.6*  PLT 209   ------------------------------------------------------------------------------------------------------------------  Chemistries   Recent Labs Lab 07/07/17 0804  NA 139  K 3.4*  CL 104  CO2 28  GLUCOSE 100*  BUN 12  CREATININE 1.10*  CALCIUM 9.1  AST 18  ALT 22  ALKPHOS 76  BILITOT 0.4   ------------------------------------------------------------------------------------------------------------------  Cardiac Enzymes  Recent Labs Lab 07/07/17 0804  TROPONINI <0.03   ------------------------------------------------------------------------------------------------------------------  RADIOLOGY:  No results found.  EKG:   Orders placed or performed during the hospital encounter of 07/11/16  . ED EKG within 10 minutes  .  ED EKG within 10 minutes  . EKG    IMPRESSION AND PLAN:   Heather Sweeney  is a 51 y.o. female with a known history of GERD, hypertension, diabetes mellitus, obesity, obstructive sleep apnea, allergic reaction to lisinopril in the past has received flu shot yesterday and coming into the ED today for swelling of her tongue and difficulty breathing and swallowing. Patient first noticed the swelling of her tongue and lips at around 2 AM. Came into the ED, received EpiPen, solum Medrol, Pepcid and Benadryl and the hospitalist team is called to admit the patient  # Angioedema- could be from reaction from the flu shot versus  losartan Admit to telemetry Patient has received EpiPen in the ED Continue IV Solu-Medrol, Benadryl and Pepcid Nebulizer treatments as needed with albuterol Duonebs every 6 hrs Discontinue losartan, add to allergy list Recommended patient to not to get flu shots anymore in the future Liquid diet at this time  # Diabetes mellitus Continue sliding scale insulin and her home medication Victoza  #Essential hypertension Continue home medication hydrochlorothiazide increase the dose from 12.5-25 mg once daily and discontinue losartan  #Obstructive sleep apnea Not on CPAP  #Hypokalemia replete potassium   Gi prophylaxis with Pepcid DVT prophylaxis with Lovenox subcutaneous  All the records are reviewed and case discussed with ED provider. Management plans discussed with the patient, family and they are in agreement.  CODE STATUS: fc , husband HCPOA  TOTAL TIME TAKING CARE OF THIS PATIENT: 45  minutes.   Note: This dictation was prepared with Dragon dictation along with smaller phrase technology. Any transcriptional errors that result from this process are unintentional.  Nicholes Mango M.D on 07/07/2017 at 1:19 PM  Between 7am to 6pm - Pager - [phone]  After 6pm go to [email or web address] - password [email or web address] [facility]  [facility] [place] [facility]  Office  [phone]  CC: Primary care physician; McLean-Scocuzza, Nino Glow, MD

## 2017-07-08 LAB — CBC
HCT: 34 % — ABNORMAL LOW (ref 35.0–47.0)
HEMOGLOBIN: 12.1 g/dL (ref 12.0–16.0)
MCH: 29.8 pg (ref 26.0–34.0)
MCHC: 35.7 g/dL (ref 32.0–36.0)
MCV: 83.3 fL (ref 80.0–100.0)
PLATELETS: 227 10*3/uL (ref 150–440)
RBC: 4.08 MIL/uL (ref 3.80–5.20)
RDW: 14.1 % (ref 11.5–14.5)
WBC: 9 10*3/uL (ref 3.6–11.0)

## 2017-07-08 LAB — COMPREHENSIVE METABOLIC PANEL
ALK PHOS: 68 U/L (ref 38–126)
ALT: 19 U/L (ref 14–54)
AST: 19 U/L (ref 15–41)
Albumin: 3.9 g/dL (ref 3.5–5.0)
Anion gap: 7 (ref 5–15)
BUN: 9 mg/dL (ref 6–20)
CHLORIDE: 104 mmol/L (ref 101–111)
CO2: 28 mmol/L (ref 22–32)
CREATININE: 0.96 mg/dL (ref 0.44–1.00)
Calcium: 9.8 mg/dL (ref 8.9–10.3)
GFR calc Af Amer: >60 mL/min (ref >60)
GFR calc non Af Amer: >60 mL/min (ref >60)
GLUCOSE: 151 mg/dL — AB (ref 65–99)
Potassium: 4 mmol/L (ref 3.5–5.1)
SODIUM: 139 mmol/L (ref 135–145)
Total Bilirubin: 0.5 mg/dL (ref 0.3–1.2)
Total Protein: 7.7 g/dL (ref 6.5–8.1)

## 2017-07-08 LAB — HIV ANTIBODY (ROUTINE TESTING W REFLEX): HIV Screen 4th Generation wRfx: NONREACTIVE

## 2017-07-08 LAB — GLUCOSE, CAPILLARY
Glucose-Capillary: 142 mg/dL — ABNORMAL HIGH (ref 65–99)
Glucose-Capillary: 142 mg/dL — ABNORMAL HIGH (ref 65–99)

## 2017-07-08 MED ORDER — EPINEPHRINE 0.15 MG/0.15ML IJ SOAJ: 0.1500 mg | INTRAMUSCULAR | 0 refills | Status: DC | PRN

## 2017-07-08 MED ORDER — DIPHENHYDRAMINE HCL 25 MG PO CAPS: 25.0000 mg | ORAL_CAPSULE | Freq: Four times a day (QID) | ORAL | 0 refills | Status: DC

## 2017-07-08 NOTE — Discharge Summary (Signed)
[place] at [place] NAME: Heather Sweeney    MR#:  [id number]  DATE OF BIRTH:  Oct 22, 1965  DATE OF ADMISSION:  07/07/2017   ADMITTING PHYSICIAN: Nicholes Mango, MD  DATE OF DISCHARGE: 07/08/2017  2:11 PM  PRIMARY CARE PHYSICIAN: McLean-Scocuzza, Nino Glow, MD   ADMISSION DIAGNOSIS:  Angioedema, initial encounter [T78.3XXA] DISCHARGE DIAGNOSIS:  Active Problems:   Angioedema  SECONDARY DIAGNOSIS:   Past Medical History:  Diagnosis Date  . Allergic rhinitis   . Bipolar disorder ([place])   . CHF (congestive heart failure) ([place])   . Depression   . GERD (gastroesophageal reflux disease)   . Hypertension    HOSPITAL COURSE:   51 y.o. female with a known history of GERD, hypertension, diabetes mellitus, obesity, obstructive sleep apnea, allergic reaction to lisinopril in the past has received flu shot and was admitted for swelling of her tongue and difficulty breathing and swallowing.   She received EpiPen, solum Medrol, Pepcid and Benadryl. She responded well to treatment and being D/Ced home in stable condition.  She was requested to avoid ACE-I/ARB, flu shot and keep Epi-pen with her always. DISCHARGE CONDITIONS:  stable CONSULTS OBTAINED:   DRUG ALLERGIES:   Allergies  Allergen Reactions  . Cephalexin Swelling and Rash  . Hctz [Hydrochlorothiazide] Rash and Swelling    Mild she says  . Influenza Vaccines Anaphylaxis    Angioedema led to hospitalization 07/2017  . Penicillins Rash    Has patient had a PCN reaction causing immediate rash, facial/tongue/throat swelling, SOB or lightheadedness with hypotension: Yes Has patient had a PCN reaction causing severe rash involving mucus membranes or skin necrosis: No Has patient had a PCN reaction that required hospitalization: No Has patient had a PCN reaction occurring within the last 10 years: No If all of the above answers are "NO", then may proceed with Cephalosporin use.   .  Losartan Swelling    Angioedema  . Lisinopril Other (See Comments) and Hives    headache Low BP headache   DISCHARGE MEDICATIONS:   Allergies as of 07/08/2017      Reactions   Cephalexin Swelling, Rash   Hctz [hydrochlorothiazide] Rash, Swelling   Mild she says   Influenza Vaccines Anaphylaxis   Angioedema led to hospitalization 07/2017   Penicillins Rash   Has patient had a PCN reaction causing immediate rash, facial/tongue/throat swelling, SOB or lightheadedness with hypotension: Yes Has patient had a PCN reaction causing severe rash involving mucus membranes or skin necrosis: No Has patient had a PCN reaction that required hospitalization: No Has patient had a PCN reaction occurring within the last 10 years: No If all of the above answers are "NO", then may proceed with Cephalosporin use.   Losartan Swelling   Angioedema   Lisinopril Other (See Comments), Hives   headache Low BP headache      Medication List    STOP taking these medications   losartan 100 MG tablet Commonly known as:  COZAAR     TAKE these medications   amLODipine 5 MG tablet Commonly known as:  NORVASC Take 5 mg by mouth daily.   aspirin 81 MG EC tablet Take 1 tablet (81 mg total) by mouth daily.   benztropine 1 MG tablet Commonly known as:  COGENTIN Take 1 mg by mouth daily as needed.   diphenhydrAMINE 25 mg capsule Commonly known as:  BENADRYL Take 1 capsule (25 mg total) by mouth every 6 (six) hours.  EPINEPHrine 0.15 MG/0.15ML injection Commonly known as:  EPIPEN JR Inject 0.15 mLs (0.15 mg total) into the muscle as needed for anaphylaxis.   FLUoxetine 20 MG capsule Commonly known as:  PROZAC Take 20 mg by mouth daily.   fluPHENAZine 2.5 MG tablet Commonly known as:  PROLIXIN Take 2.5 mg by mouth at bedtime.   fluticasone 50 MCG/ACT nasal spray Commonly known as:  FLONASE Place 2 sprays into both nostrils daily.   gabapentin 300 MG capsule Commonly known as:   NEURONTIN Take 600 mg by mouth at bedtime.   hydrochlorothiazide 12.5 MG capsule Commonly known as:  MICROZIDE Take by mouth.   Melatonin 5 MG Tabs Take 1 tablet by mouth daily.   MULTIVITAMIN GUMMIES WOMENS PO Take 2 tablets by mouth daily.   oxyCODONE-acetaminophen 5-325 MG tablet Commonly known as:  PERCOCET/ROXICET Take one to two tablets by mouth every four to six hours as needed for pain as needed   pantoprazole 40 MG tablet Commonly known as:  PROTONIX Take 40 mg by mouth daily.   solifenacin 10 MG tablet Commonly known as:  VESICARE Take 10 mg by mouth daily.   traMADol 50 MG tablet Commonly known as:  ULTRAM Take 1 tablet (50 mg total) by mouth every 6 (six) hours as needed.   VICTOZA 18 MG/3ML Sopn Generic drug:  liraglutide Inject 1.8 mg into the skin daily.   vitamin B-12 1000 MCG tablet Commonly known as:  CYANOCOBALAMIN Take 1,000 mcg by mouth daily.        DISCHARGE INSTRUCTIONS:   DIET:  Regular diet DISCHARGE CONDITION:  Good ACTIVITY:  Activity as tolerated OXYGEN:  Home Oxygen: No.  Oxygen Delivery: room air DISCHARGE LOCATION:  home   If you experience worsening of your admission symptoms, develop shortness of breath, life threatening emergency, suicidal or homicidal thoughts you must seek medical attention immediately by calling 911 or calling your MD immediately  if symptoms less severe.  You Must read complete instructions/literature along with all the possible adverse reactions/side effects for all the Medicines you take and that have been prescribed to you. Take any new Medicines after you have completely understood and accpet all the possible adverse reactions/side effects.   Please note  You were cared for by a hospitalist during your hospital stay. If you have any questions about your discharge medications or the care you received while you were in the hospital after you are discharged, you can call the unit and asked to speak  with the hospitalist on call if the hospitalist that took care of you is not available. Once you are discharged, your primary care physician will handle any further medical issues. Please note that NO REFILLS for any discharge medications will be authorized once you are discharged, as it is imperative that you return to your primary care physician (or establish a relationship with a primary care physician if you do not have one) for your aftercare needs so that they can reassess your need for medications and monitor your lab values.    On the day of Discharge:  VITAL SIGNS:  Blood pressure 135/76, pulse 99, temperature 98.3 F (36.8 C), temperature source Oral, resp. rate 18, height 5\' 3"  (1.6 m), weight 114.9 kg (253 lb 4.8 oz), SpO2 97 %. PHYSICAL EXAMINATION:  GENERAL:  51 y.o.-year-old patient lying in the bed with no acute distress.  EYES: Pupils equal, round, reactive to light and accommodation. No scleral icterus. Extraocular muscles intact.  HEENT: Head atraumatic, normocephalic.  Oropharynx and nasopharynx clear.  NECK:  Supple, no jugular venous distention. No thyroid enlargement, no tenderness.  LUNGS: Normal breath sounds bilaterally, no wheezing, rales,rhonchi or crepitation. No use of accessory muscles of respiration.  CARDIOVASCULAR: S1, S2 normal. No murmurs, rubs, or gallops.  ABDOMEN: Soft, non-tender, non-distended. Bowel sounds present. No organomegaly or mass.  EXTREMITIES: No pedal edema, cyanosis, or clubbing.  NEUROLOGIC: Cranial nerves II through XII are intact. Muscle strength 5/5 in all extremities. Sensation intact. Gait not checked.  PSYCHIATRIC: The patient is alert and oriented x 3.  SKIN: No obvious rash, lesion, or ulcer.  DATA REVIEW:   CBC  Recent Labs Lab 07/08/17 0437  WBC 9.0  HGB 12.1  HCT 34.0*  PLT 227    Chemistries   Recent Labs Lab 07/08/17 0437  NA 139  K 4.0  CL 104  CO2 28  GLUCOSE 151*  BUN 9  CREATININE 0.96  CALCIUM 9.8   AST 19  ALT 19  ALKPHOS 68  BILITOT 0.5     Follow-up Information    McLean-Scocuzza, Nino Glow, MD. Schedule an appointment as soon as possible for a visit in 1 week(s).   Specialty:  Internal Medicine Contact information: [address] [place] [address] [phone]           Management plans discussed with the patient, family and they are in agreement.  CODE STATUS: Prior   TOTAL TIME TAKING CARE OF THIS PATIENT: 45 minutes.    Max Sane M.D on 07/08/2017 at 8:20 PM  Between 7am to 6pm - Pager - [phone]  After 6pm go to [email or web address] - Proofreader  [facility] [place] [facility]  Office  [phone]  CC: Primary care physician; McLean-Scocuzza, Nino Glow, MD   Note: This dictation was prepared with Dragon dictation along with smaller phrase technology. Any transcriptional errors that result from this process are unintentional.

## 2017-07-08 NOTE — Progress Notes (Signed)
D/C telemetry and PIV.  No questions.  Patient to be escorted out of hospital via wheelchair by volunteers.

## 2017-07-08 NOTE — Progress Notes (Signed)
[place] at [place] was admitted to the [facility] on 07/07/2017 and Discharged  07/08/2017 and her husband was here so should be excused from work for 2 days starting 07/07/2017 , may return to work without any restrictions on 07/09/2017.  Max Sane M.D on 07/08/2017,at 1:45 PM  [place] at [facility]  [phone]

## 2017-07-08 NOTE — Discharge Instructions (Signed)
Angioedema  Angioedema is sudden swelling in the body. The swelling can happen in any part of the body. It often happens on the skin and causes itchy, bumpy patches (hives) to form.  This condition may:  · Happen only one time.  · Happen more than one time. It may come back at random times.  · Keep coming back for a number of years. Someday it may stop coming back.    Follow these instructions at home:  · Take over-the-counter and prescription medicines only as told by your doctor.  · If you were given medicines for emergency allergy treatment, always carry them with you.  · Wear a medical bracelet as told by your doctor.  · Avoid the things that cause your attacks (triggers).  · If this condition was passed to you from your parents and you want to have kids, talk to your doctor. Your kids may also have this condition.  Contact a doctor if:  · You have another attack.  · Your attacks happen more often, even after you take steps to prevent them.  · This condition was passed to you by your parents and you want to have kids.  Get help right away if:  · Your mouth, tongue, or lips get very swollen.  · You have trouble breathing.  · You have trouble swallowing.  · You pass out (faint).  This information is not intended to replace advice given to you by your health care provider. Make sure you discuss any questions you have with your health care provider.  Document Released: 09/10/2009 Document Revised: 04/23/2016 Document Reviewed: 04/01/2016  Elsevier Interactive Patient Education © 2017 [employer] Inc[employer]

## 2017-07-13 ENCOUNTER — Other Ambulatory Visit: Payer: Self-pay

## 2017-07-13 NOTE — Patient Outreach (Signed)
[facility]) Care Management  07/13/2017  Heather Sweeney [birth year] [id number]   EMMI: General discharge Referral date: 07/13/17 Referral source: EMMI general discharge red alert Referral reason: taking medications: NO Day # 1  Telephone call to patient regarding EMMI general discharge red alert. HIPAA verified with patient.  Discussed EMMI program with patient. Patient states she is not having any issues with her medications. Patient reports she saw her primary MD on Friday 07/10/17. States her primary MD discontinued her blood pressure and fluid pill.  Patient states she is taking all of her other medications as prescribed. Patient states he is able to afford her medications. Patient states she has medicare and medicaid.  Patient states she is independent in her care and  has transportation to her appointments. Patient state she would like to have an Advance directive packet mailed to her.  Denies any further needs at this time.   PLAN:  RNCM will refer patient to care management assistant to close due to patient being assessed and having no further needs. RNCM will send patient advance directive packet as requested.   Quinn Plowman RN,BSN,CCM [facility] Telephonic  [phone]

## 2017-12-10 ENCOUNTER — Other Ambulatory Visit: Payer: Self-pay | Admitting: Internal Medicine

## 2017-12-10 DIAGNOSIS — R112 Nausea with vomiting, unspecified: Secondary | ICD-10-CM

## 2017-12-14 ENCOUNTER — Ambulatory Visit
Admission: RE | Admit: 2017-12-14 | Discharge: 2017-12-14 | Disposition: A | Discharge status: Home / Self Care | Payer: Medicare HMO | Source: Ambulatory Visit | Attending: Internal Medicine | Admitting: Internal Medicine

## 2017-12-14 DIAGNOSIS — R112 Nausea with vomiting, unspecified: Secondary | ICD-10-CM | POA: Diagnosis present

## 2017-12-14 DIAGNOSIS — K760 Fatty (change of) liver, not elsewhere classified: Secondary | ICD-10-CM | POA: Insufficient documentation

## 2017-12-14 DIAGNOSIS — K8020 Calculus of gallbladder without cholecystitis without obstruction: Secondary | ICD-10-CM | POA: Insufficient documentation

## 2017-12-30 NOTE — Patient Instructions (Addendum)
Terea Neubauer  12/30/2017   Your procedure is scheduled on: Tuesday 01/12/2018  Report to [facility] Main  Entrance   Report to admitting at  1130  AM    Call this number if you have problems the morning of surgery [phone]               Please bring CPAP mask and tubing morning of surgery!   Remember: Do not eat food :After Midnight.   NO SOLID FOOD AFTER MIDNIGHT THE NIGHT PRIOR TO SURGERY. NOTHING BY MOUTH EXCEPT CLEAR LIQUIDS UNTIL 3 HOURS PRIOR TO [place] SURGERY. PLEASE FINISH ENSURE DRINK PER SURGEON ORDER 3 HOURS PRIOR TO SCHEDULED SURGERY TIME WHICH NEEDS TO BE COMPLETED AT 1030 am.    CLEAR LIQUID DIET   Foods Allowed                                                                     Foods Excluded  Coffee and tea, regular and decaf                             liquids that you cannot  Plain Jell-O in any flavor                                             see through such as: Fruit ices (not with fruit pulp)                                     milk, soups, orange juice  Iced Popsicles                                    All solid food Carbonated beverages, regular and diet                                    Cranberry, grape and apple juices Sports drinks like Gatorade Lightly seasoned clear broth or consume(fat free) Sugar, honey syrup  Sample Menu Breakfast                                Lunch                                     Supper Cranberry juice                    Beef broth                            Chicken broth Jell-O  Grape juice                           Apple juice Coffee or tea                        Jell-O                                      Popsicle                                                Coffee or tea                        Coffee or tea  _____________________________________________________________________ How to Manage Your Diabetes Before and After Surgery  Why  is it important to control my blood sugar before and after surgery? . Improving blood sugar levels before and after surgery helps healing and can limit problems. . A way of improving blood sugar control is eating a healthy diet by: o  Eating less sugar and carbohydrates o  Increasing activity/exercise o  Talking with your doctor about reaching your blood sugar goals . High blood sugars (greater than 180 mg/dL) can raise your risk of infections and slow your recovery, so you will need to focus on controlling your diabetes during the weeks before surgery. . Make sure that the doctor who takes care of your diabetes knows about your planned surgery including the date and location.  How do I manage my blood sugar before surgery? . Check your blood sugar at least 4 times a day, starting 2 days before surgery, to make sure that the level is not too high or low. o Check your blood sugar the morning of your surgery when you wake up and every 2 hours until you get to the Short Stay unit. . If your blood sugar is less than 70 mg/dL, you will need to treat for low blood sugar: o Do not take insulin. o Treat a low blood sugar (less than 70 mg/dL) with  cup of clear juice (cranberry or apple), 4 glucose tablets, OR glucose gel. o Recheck blood sugar in 15 minutes after treatment (to make sure it is greater than 70 mg/dL). If your blood sugar is not greater than 70 mg/dL on recheck, call [phone] for further instructions. . Report your blood sugar to the short stay nurse when you get to Short Stay.  . If you are admitted to the hospital after surgery: o Your blood sugar will be checked by the staff and you will probably be given insulin after surgery (instead of oral diabetes medicines) to make sure you have good blood sugar levels. o The goal for blood sugar control after surgery is 80-180 mg/dL.   WHAT DO I DO ABOUT MY DIABETES MEDICATION?        The Day before surgery, Take Victoza as  usual.  . Do not take oral diabetes medicines (pills) the morning of surgery.   . The day of surgery, do not take other diabetes injectables, including Byetta (exenatide), Bydureon (exenatide ER), Victoza (liraglutide), or Trulicity (dulaglutide).     Take these medicines the morning of surgery with A  SIP OF WATER: Amlodipine (Norvasc), Fluoxetine (Prozac), Metoprolol (Toprol XL), Pantoprazole (Protonix), use Flonase nasal spray    DO NOT TAKE ANY DIABETIC MEDICATIONS DAY OF YOUR SURGERY                               You may not have any metal on your body including hair pins and              piercings  Do not wear jewelry, make-up, lotions, powders or perfumes, deodorant             Do not wear nail polish.  Do not shave  48 hours prior to surgery.              Men may shave face and neck.   Do not bring valuables to the hospital. [place].  Contacts, dentures or bridgework may not be worn into surgery.  Leave suitcase in the car. After surgery it may be brought to your room.     Patients discharged the day of surgery will not be allowed to drive home.  Name and phone number of your driver:spouse- Alvester Chou or sisterOrson Slick                Please read over the following fact sheets you were given: _____________________________________________________________________             [facility] - Preparing for Surgery Before surgery, you can play an important role.  Because skin is not sterile, your skin needs to be as free of germs as possible.  You can reduce the number of germs on your skin by washing with CHG (chlorahexidine gluconate) soap before surgery.  CHG is an antiseptic cleaner which kills germs and bonds with the skin to continue killing germs even after washing. Please DO NOT use if you have an allergy to CHG or antibacterial soaps.  If your skin becomes reddened/irritated stop using the CHG and inform your nurse when you  arrive at Short Stay. Do not shave (including legs and underarms) for at least 48 hours prior to the first CHG shower.  You may shave your face/neck. Please follow these instructions carefully:  1.  Shower with CHG Soap the night before surgery and the  morning of Surgery.  2.  If you choose to wash your hair, wash your hair first as usual with your  normal  shampoo.  3.  After you shampoo, rinse your hair and body thoroughly to remove the  shampoo.                           4.  Use CHG as you would any other liquid soap.  You can apply chg directly  to the skin and wash                       Gently with a scrungie or clean washcloth.  5.  Apply the CHG Soap to your body ONLY FROM THE NECK DOWN.   Do not use on face/ open                           Wound or open sores. Avoid contact with eyes, ears mouth and genitals (private parts).  Wash face,  Genitals (private parts) with your normal soap.             6.  Wash thoroughly, paying special attention to the area where your surgery  will be performed.  7.  Thoroughly rinse your body with warm water from the neck down.  8.  DO NOT shower/wash with your normal soap after using and rinsing off  the CHG Soap.                9.  Pat yourself dry with a clean towel.            10.  Wear clean pajamas.            11.  Place clean sheets on your bed the night of your first shower and do not  sleep with pets. Day of Surgery : Do not apply any lotions/deodorants the morning of surgery.  Please wear clean clothes to the hospital/surgery center.  FAILURE TO FOLLOW THESE INSTRUCTIONS MAY RESULT IN THE CANCELLATION OF YOUR SURGERY PATIENT SIGNATURE_________________________________  NURSE SIGNATURE__________________________________  ________________________________________________________________________

## 2018-01-05 NOTE — Progress Notes (Signed)
07/12/2016- noted in Epic- Stress test

## 2018-01-06 ENCOUNTER — Encounter (HOSPITAL_COMMUNITY)
Admission: RE | Admit: 2018-01-06 | Discharge: 2018-01-06 | Disposition: A | Discharge status: Home / Self Care | Payer: Medicare HMO | Source: Ambulatory Visit | Attending: Surgery | Admitting: Surgery

## 2018-01-06 ENCOUNTER — Encounter (HOSPITAL_COMMUNITY): Payer: Self-pay

## 2018-01-06 ENCOUNTER — Other Ambulatory Visit: Payer: Self-pay

## 2018-01-06 DIAGNOSIS — E119 Type 2 diabetes mellitus without complications: Secondary | ICD-10-CM | POA: Insufficient documentation

## 2018-01-06 DIAGNOSIS — I10 Essential (primary) hypertension: Secondary | ICD-10-CM | POA: Insufficient documentation

## 2018-01-06 DIAGNOSIS — Z01810 Encounter for preprocedural cardiovascular examination: Secondary | ICD-10-CM | POA: Diagnosis not present

## 2018-01-06 DIAGNOSIS — Z01812 Encounter for preprocedural laboratory examination: Secondary | ICD-10-CM | POA: Diagnosis present

## 2018-01-06 LAB — GLUCOSE, CAPILLARY: Glucose-Capillary: 112 mg/dL — ABNORMAL HIGH (ref 65–99)

## 2018-01-06 LAB — BASIC METABOLIC PANEL
Anion gap: 6 (ref 5–15)
CALCIUM: 9.2 mg/dL (ref 8.9–10.3)
CO2: 27 mmol/L (ref 22–32)
Chloride: 106 mmol/L (ref 101–111)
Creatinine, Ser: 0.93 mg/dL (ref 0.44–1.00)
GFR calc Af Amer: >60 mL/min (ref >60)
GLUCOSE: 88 mg/dL (ref 65–99)
POTASSIUM: 4.2 mmol/L (ref 3.5–5.1)
Sodium: 139 mmol/L (ref 135–145)

## 2018-01-06 LAB — CBC
HEMATOCRIT: 34.9 % — AB (ref 36.0–46.0)
Hemoglobin: 12.2 g/dL (ref 12.0–15.0)
MCH: 27.9 pg (ref 26.0–34.0)
MCHC: 35 g/dL (ref 30.0–36.0)
MCV: 79.7 fL (ref 78.0–100.0)
Platelets: 214 10*3/uL (ref 150–400)
RBC: 4.38 MIL/uL (ref 3.87–5.11)
RDW: 14.3 % (ref 11.5–15.5)
WBC: 5 10*3/uL (ref 4.0–10.5)

## 2018-01-06 LAB — HEMOGLOBIN A1C
HEMOGLOBIN A1C: 5 % (ref 4.8–5.6)
MEAN PLASMA GLUCOSE: 96.8 mg/dL

## 2018-01-10 NOTE — H&P (Signed)
Heather Sweeney  Documented: 12/24/2017 10:41 AM  Location: [place] Office  Patient #: [id number]  DOB: 1966/09/06  Divorced / Language: Cleophus Molt / Race: White  Female      History of Present Illness Rodman Key B. Hassell Done MD; 12/24/2017 11:06 AM)  The patient is a 52 year old female who presents for evaluation of gall stones. Several month history of postprandial nausea and vomiting. She has been worked up by Dr. Humphrey Rolls and found to have gallstones on her ultrasound. She has been having "greasy" vomitus.     She has had a prior hysterectomy. I explained and drew pics of gallbladder surgery and the potential complications (CBD injury or leaks) and gave her the [facility] booklet on lap chole. She seems aware of the risks.   She wants this scheduled as soon as possible and has no preference over [facility] or [facility].       Problem List/Past Medical Sharyn Lull R. Brooks, CMA; 12/24/2017 10:48 AM)  Anxiety   Back Pain   Depression     Past Surgical History Sharyn Lull R. Brooks, CMA; 12/24/2017 10:50 AM)  Hysterectomy (not due to cancer) - Partial     Allergies Sharyn Lull R. Brooks, [place]; 12/24/2017 10:44 AM)  Cephalexin *CEPHALOSPORINS*  Swelling.  HydroCHLOROthiazide *DIURETICS*  Swelling.  Penicillin G Pot in Dextrose *PENICILLINS*  Rash.  Losartan Potassium *ANTIHYPERTENSIVES*  Swelling.  Lisinopril *CHEMICALS*  Hives.    Medication History Sharyn Lull R. Brooks, CMA; 12/24/2017 10:46 AM)  AmLODIPine Besylate (5MG  Tablet, Oral) Active.  Benztropine Mesylate (1MG  Tablet, Oral) Active.  EPINEPHrine (0.15MG /0.3ML Soln Auto-inj, Injection) Active.  FLUoxetine HCl (20MG  Capsule, Oral) Active.  FluPHENAZine HCl (2.5MG  Tablet, Oral) Active.  Fluticasone Propionate (50MCG/ACT Suspension, Nasal) Active.  Gabapentin (100MG  Capsule, Oral) Active.  Victoza (18MG /3ML Soln Pen-inj, Subcutaneous) Active.  Oxycodone-Acetaminophen (10-325MG  Tablet, Oral) Active.  Pantoprazole  Sodium (40MG  Tablet DR, Oral) Active.  VESIcare (10MG  Tablet, Oral) Active.  Aspirin (81MG  Tablet, Oral) Active.  DiphenhydrAMINE HCl (25MG  Tablet, Oral) Active.  Melatonin (5MG  Capsule, Oral) Active.  Multiple Vitamins (Oral) Active.  Medications Reconciled    Social History Sharyn Lull R. Brooks, CMA; 12/24/2017 10:49 AM)  No alcohol use   No caffeine use     Family History Sharyn Lull R. Rolena Infante, CMA; 12/24/2017 10:49 AM)  Breast Cancer  Sister.  Depression  Sister.  High Blood Pressure / Hypertension  Mother.  Other Cancer  Father.    Pregnancy / Birth History Sharyn Lull R. Rolena Infante, CMA; 12/24/2017 10:48 AM)  Age at menarche  53 years.  Regular periods   Age of menopause  63-50.  Pregnancies ([place])  2.  Deliveries ([place])  2.  Mammogram  1-3 years ago.  Pap smear  1-5 years ago.        Review of Systems ([place]. Brooks CMA; 12/24/2017 10:42 AM)  General Present- Appetite Loss. Not Present- Chills, Fatigue, Fever, Night Sweats, Weight Gain and Weight Loss.  Skin Not Present- Change in Wart/Mole, Dryness, Hives, Jaundice, New Lesions, Non-Healing Wounds, Rash and Ulcer.  HEENT Present- Seasonal Allergies. Not Present- Earache, Hearing Loss, Hoarseness, Nose Bleed, Oral Ulcers, Ringing in the Ears, Sinus Pain, Sore Throat, Visual Disturbances, Wears glasses/contact lenses and Yellow Eyes.  Breast Not Present- Breast Mass, Breast Pain, Nipple Discharge and Skin Changes.  Cardiovascular Present- Chest Pain. Not Present- Difficulty Breathing Lying Down, Leg Cramps, Palpitations, Rapid Heart Rate, Shortness of Breath and Swelling of Extremities.  Gastrointestinal Present- Constipation. Not Present- Abdominal Pain, Bloating, Bloody Stool, Change in Bowel Habits, Chronic diarrhea, Difficulty  Swallowing, Excessive gas, Gets full quickly at meals, Hemorrhoids, Indigestion, Nausea, Rectal Pain and Vomiting.  Musculoskeletal Present- Back Pain. Not  Present- Joint Pain, Joint Stiffness, Muscle Pain, Muscle Weakness and Swelling of Extremities.  Neurological Not Present- Decreased Memory, Fainting, Headaches, Numbness, Seizures, Tingling, Tremor, Trouble walking and Weakness.  Psychiatric Present- Anxiety, Bipolar and Depression. Not Present- Change in Sleep Pattern, Fearful and Frequent crying.  Endocrine Present- Hot flashes. Not Present- Cold Intolerance, Excessive Hunger, Hair Changes, Heat Intolerance and New Diabetes.  Hematology Not Present- Blood Thinners, Easy Bruising, Excessive bleeding, Gland problems, HIV and Persistent Infections.    Vitals [employer] R. Brooks CMA; 12/24/2017 10:42 AM)  12/24/2017 10:41 AM  Weight: 229.5 lb Height: 63in  Body Surface Area: 2.05 m Body Mass Index: 40.65 kg/m   Pulse: 80 (Regular)   BP: 118/78 (Sitting, Left Arm, Standard)    Physical Exam (Christop Hippert B. Hassell Done MD; 12/24/2017 11:07 AM)  General  Note: Normally developed AAF NAD  HEENT unremarkable  Neck supple without masses  Chest clear  Heart sinus rhythm without murmurs  Abdomen nontender  Ext FROM          Assessment & Plan Rodman Key B. Hassell Done MD; 12/24/2017 11:08 AM)  GALLSTONES (K80.20)  Impression: Will schedule for lap chole/ IOC at [facility] or [facility].  Matt B. Hassell Done, MD, FACS

## 2018-01-12 ENCOUNTER — Encounter (HOSPITAL_COMMUNITY)
Admission: RE | Disposition: A | Discharge status: Home / Self Care | Payer: Self-pay | Source: Ambulatory Visit | Attending: Surgery

## 2018-01-12 ENCOUNTER — Encounter (HOSPITAL_COMMUNITY): Payer: Self-pay

## 2018-01-12 ENCOUNTER — Ambulatory Visit (HOSPITAL_COMMUNITY)
Admission: RE | Admit: 2018-01-12 | Discharge: 2018-01-12 | Disposition: A | Discharge status: Home / Self Care | Payer: Medicare HMO | Source: Ambulatory Visit | Attending: Surgery | Admitting: Surgery

## 2018-01-12 ENCOUNTER — Ambulatory Visit (HOSPITAL_COMMUNITY): Payer: Medicare HMO | Admitting: Registered Nurse

## 2018-01-12 ENCOUNTER — Ambulatory Visit (HOSPITAL_COMMUNITY): Payer: Medicare HMO

## 2018-01-12 DIAGNOSIS — G4730 Sleep apnea, unspecified: Secondary | ICD-10-CM | POA: Diagnosis not present

## 2018-01-12 DIAGNOSIS — K219 Gastro-esophageal reflux disease without esophagitis: Secondary | ICD-10-CM | POA: Insufficient documentation

## 2018-01-12 DIAGNOSIS — E119 Type 2 diabetes mellitus without complications: Secondary | ICD-10-CM | POA: Diagnosis not present

## 2018-01-12 DIAGNOSIS — I10 Essential (primary) hypertension: Secondary | ICD-10-CM | POA: Insufficient documentation

## 2018-01-12 DIAGNOSIS — Z7982 Long term (current) use of aspirin: Secondary | ICD-10-CM | POA: Diagnosis not present

## 2018-01-12 DIAGNOSIS — Z9989 Dependence on other enabling machines and devices: Secondary | ICD-10-CM | POA: Diagnosis not present

## 2018-01-12 DIAGNOSIS — Z7951 Long term (current) use of inhaled steroids: Secondary | ICD-10-CM | POA: Diagnosis not present

## 2018-01-12 DIAGNOSIS — Z79899 Other long term (current) drug therapy: Secondary | ICD-10-CM | POA: Insufficient documentation

## 2018-01-12 DIAGNOSIS — Z9049 Acquired absence of other specified parts of digestive tract: Secondary | ICD-10-CM

## 2018-01-12 DIAGNOSIS — F419 Anxiety disorder, unspecified: Secondary | ICD-10-CM | POA: Insufficient documentation

## 2018-01-12 DIAGNOSIS — Z6841 Body Mass Index (BMI) 40.0 and over, adult: Secondary | ICD-10-CM | POA: Insufficient documentation

## 2018-01-12 DIAGNOSIS — F329 Major depressive disorder, single episode, unspecified: Secondary | ICD-10-CM | POA: Insufficient documentation

## 2018-01-12 DIAGNOSIS — Z7984 Long term (current) use of oral hypoglycemic drugs: Secondary | ICD-10-CM | POA: Insufficient documentation

## 2018-01-12 DIAGNOSIS — Z79891 Long term (current) use of opiate analgesic: Secondary | ICD-10-CM | POA: Diagnosis not present

## 2018-01-12 DIAGNOSIS — K8010 Calculus of gallbladder with chronic cholecystitis without obstruction: Secondary | ICD-10-CM | POA: Diagnosis not present

## 2018-01-12 DIAGNOSIS — K8080 Other cholelithiasis without obstruction: Secondary | ICD-10-CM | POA: Diagnosis present

## 2018-01-12 HISTORY — DX: Acquired absence of other specified parts of digestive tract: Z90.49

## 2018-01-12 HISTORY — PX: CHOLECYSTECTOMY: SHX55

## 2018-01-12 LAB — GLUCOSE, CAPILLARY
GLUCOSE-CAPILLARY: 106 mg/dL — AB (ref 65–99)
Glucose-Capillary: 120 mg/dL — ABNORMAL HIGH (ref 65–99)

## 2018-01-12 SURGERY — LAPAROSCOPIC CHOLECYSTECTOMY WITH INTRAOPERATIVE CHOLANGIOGRAM
Anesthesia: General

## 2018-01-12 MED ORDER — CHLORHEXIDINE GLUCONATE CLOTH 2 % EX PADS: 6.0000 | MEDICATED_PAD | Freq: Once | CUTANEOUS | Status: DC

## 2018-01-12 MED ORDER — PHENYLEPHRINE 40 MCG/ML (10ML) SYRINGE FOR IV PUSH (FOR BLOOD PRESSURE SUPPORT)
PREFILLED_SYRINGE | INTRAVENOUS | Status: DC | PRN
  Administered 2018-01-12 (×3): 80 ug via INTRAVENOUS

## 2018-01-12 MED ORDER — LACTATED RINGERS IR SOLN
Status: DC | PRN
  Administered 2018-01-12: 1000 mL

## 2018-01-12 MED ORDER — HYDROMORPHONE HCL 1 MG/ML IJ SOLN
INTRAMUSCULAR | Status: AC
  Filled 2018-01-12: qty 1

## 2018-01-12 MED ORDER — SUGAMMADEX SODIUM 500 MG/5ML IV SOLN
INTRAVENOUS | Status: DC | PRN
  Administered 2018-01-12: 400 mg via INTRAVENOUS

## 2018-01-12 MED ORDER — ROCURONIUM BROMIDE 10 MG/ML (PF) SYRINGE
PREFILLED_SYRINGE | INTRAVENOUS | Status: DC | PRN
  Administered 2018-01-12: 50 mg via INTRAVENOUS
  Administered 2018-01-12: 20 mg via INTRAVENOUS

## 2018-01-12 MED ORDER — KETOROLAC TROMETHAMINE 30 MG/ML IJ SOLN: 30.0000 mg | Freq: Once | INTRAMUSCULAR | Status: DC | PRN

## 2018-01-12 MED ORDER — PHENYLEPHRINE HCL 10 MG/ML IJ SOLN: INTRAMUSCULAR | Status: DC | PRN

## 2018-01-12 MED ORDER — ROCURONIUM BROMIDE 10 MG/ML (PF) SYRINGE
PREFILLED_SYRINGE | INTRAVENOUS | Status: AC
  Filled 2018-01-12: qty 5

## 2018-01-12 MED ORDER — 0.9 % SODIUM CHLORIDE (POUR BTL) OPTIME
TOPICAL | Status: DC | PRN
  Administered 2018-01-12: 1000 mL

## 2018-01-12 MED ORDER — BUPIVACAINE LIPOSOME 1.3 % IJ SUSP
20.0000 mL | Freq: Once | INTRAMUSCULAR | Status: DC
  Filled 2018-01-12: qty 20

## 2018-01-12 MED ORDER — FENTANYL CITRATE (PF) 250 MCG/5ML IJ SOLN
INTRAMUSCULAR | Status: AC
  Filled 2018-01-12: qty 5

## 2018-01-12 MED ORDER — PROMETHAZINE HCL 25 MG/ML IJ SOLN
6.2500 mg | INTRAMUSCULAR | Status: DC | PRN
Start: 2018-01-12 — End: 2018-01-12

## 2018-01-12 MED ORDER — FENTANYL CITRATE (PF) 250 MCG/5ML IJ SOLN
INTRAMUSCULAR | Status: DC | PRN
  Administered 2018-01-12: 100 ug via INTRAVENOUS
  Administered 2018-01-12: 50 ug via INTRAVENOUS

## 2018-01-12 MED ORDER — LACTATED RINGERS IV SOLN
INTRAVENOUS | Status: DC
  Administered 2018-01-12: 1000 mL via INTRAVENOUS
  Administered 2018-01-12: 12:00:00 via INTRAVENOUS

## 2018-01-12 MED ORDER — HYDROMORPHONE HCL 1 MG/ML IJ SOLN
0.2500 mg | INTRAMUSCULAR | Status: DC | PRN
  Administered 2018-01-12: 0.25 mg via INTRAVENOUS

## 2018-01-12 MED ORDER — PROPOFOL 10 MG/ML IV BOLUS
INTRAVENOUS | Status: DC | PRN
  Administered 2018-01-12: 200 mg via INTRAVENOUS

## 2018-01-12 MED ORDER — LIDOCAINE 2% (20 MG/ML) 5 ML SYRINGE
INTRAMUSCULAR | Status: AC
  Filled 2018-01-12: qty 5

## 2018-01-12 MED ORDER — DEXAMETHASONE SODIUM PHOSPHATE 10 MG/ML IJ SOLN
INTRAMUSCULAR | Status: AC
  Filled 2018-01-12: qty 1

## 2018-01-12 MED ORDER — CIPROFLOXACIN IN D5W 400 MG/200ML IV SOLN
400.0000 mg | INTRAVENOUS | Status: AC
  Administered 2018-01-12: 400 mg via INTRAVENOUS
  Filled 2018-01-12: qty 200

## 2018-01-12 MED ORDER — METOPROLOL TARTRATE 50 MG PO TABS
50.0000 mg | ORAL_TABLET | Freq: Once | ORAL | Status: AC
  Administered 2018-01-12: 50 mg via ORAL
  Filled 2018-01-12: qty 1

## 2018-01-12 MED ORDER — SUGAMMADEX SODIUM 500 MG/5ML IV SOLN
INTRAVENOUS | Status: AC
  Filled 2018-01-12: qty 5

## 2018-01-12 MED ORDER — DEXAMETHASONE SODIUM PHOSPHATE 10 MG/ML IJ SOLN
INTRAMUSCULAR | Status: DC | PRN
  Administered 2018-01-12: 10 mg via INTRAVENOUS

## 2018-01-12 MED ORDER — PHENYLEPHRINE 40 MCG/ML (10ML) SYRINGE FOR IV PUSH (FOR BLOOD PRESSURE SUPPORT)
PREFILLED_SYRINGE | INTRAVENOUS | Status: AC
  Filled 2018-01-12: qty 20

## 2018-01-12 MED ORDER — IOPAMIDOL (ISOVUE-300) INJECTION 61%
INTRAVENOUS | Status: AC
  Filled 2018-01-12: qty 50

## 2018-01-12 MED ORDER — ONDANSETRON HCL 4 MG/2ML IJ SOLN
INTRAMUSCULAR | Status: AC
  Filled 2018-01-12: qty 2

## 2018-01-12 MED ORDER — PROPOFOL 10 MG/ML IV BOLUS
INTRAVENOUS | Status: AC
  Filled 2018-01-12: qty 20

## 2018-01-12 MED ORDER — SCOPOLAMINE 1 MG/3DAYS TD PT72
MEDICATED_PATCH | TRANSDERMAL | Status: AC
  Filled 2018-01-12: qty 1

## 2018-01-12 MED ORDER — MIDAZOLAM HCL 5 MG/5ML IJ SOLN
INTRAMUSCULAR | Status: DC | PRN
  Administered 2018-01-12: 2 mg via INTRAVENOUS

## 2018-01-12 MED ORDER — LIDOCAINE 2% (20 MG/ML) 5 ML SYRINGE
INTRAMUSCULAR | Status: DC | PRN
  Administered 2018-01-12: 100 mg via INTRAVENOUS

## 2018-01-12 MED ORDER — HEPARIN SODIUM (PORCINE) 5000 UNIT/ML IJ SOLN
5000.0000 [IU] | Freq: Once | INTRAMUSCULAR | Status: AC
  Administered 2018-01-12: 5000 [IU] via SUBCUTANEOUS
  Filled 2018-01-12: qty 1

## 2018-01-12 MED ORDER — SCOPOLAMINE 1 MG/3DAYS TD PT72
MEDICATED_PATCH | TRANSDERMAL | Status: DC | PRN
  Administered 2018-01-12: 1 via TRANSDERMAL

## 2018-01-12 MED ORDER — MIDAZOLAM HCL 2 MG/2ML IJ SOLN
INTRAMUSCULAR | Status: AC
  Filled 2018-01-12: qty 2

## 2018-01-12 MED ORDER — MEPERIDINE HCL 50 MG/ML IJ SOLN: 6.2500 mg | INTRAMUSCULAR | Status: DC | PRN

## 2018-01-12 MED ORDER — GABAPENTIN 300 MG PO CAPS
300.0000 mg | ORAL_CAPSULE | ORAL | Status: AC
  Administered 2018-01-12: 300 mg via ORAL
  Filled 2018-01-12: qty 1

## 2018-01-12 MED ORDER — IOPAMIDOL (ISOVUE-300) INJECTION 61%
INTRAVENOUS | Status: DC | PRN
  Administered 2018-01-12: 3 mL

## 2018-01-12 MED ORDER — ONDANSETRON HCL 4 MG/2ML IJ SOLN
INTRAMUSCULAR | Status: DC | PRN
  Administered 2018-01-12: 4 mg via INTRAVENOUS

## 2018-01-12 MED ORDER — OXYCODONE-ACETAMINOPHEN 10-325 MG PO TABS: 1.0000 | ORAL_TABLET | Freq: Four times a day (QID) | ORAL | 0 refills | Status: DC | PRN

## 2018-01-12 MED ORDER — ACETAMINOPHEN 500 MG PO TABS
1000.0000 mg | ORAL_TABLET | ORAL | Status: AC
  Administered 2018-01-12: 1000 mg via ORAL
  Filled 2018-01-12: qty 2

## 2018-01-12 MED ORDER — BUPIVACAINE LIPOSOME 1.3 % IJ SUSP
INTRAMUSCULAR | Status: DC | PRN
  Administered 2018-01-12: 20 mL

## 2018-01-12 SURGICAL SUPPLY — 45 items
ADH SKN CLS APL DERMABOND .7 (GAUZE/BANDAGES/DRESSINGS) ×1
APL SKNCLS STERI-STRIP NONHPOA (GAUZE/BANDAGES/DRESSINGS)
APPLICATOR COTTON TIP 6IN STRL (MISCELLANEOUS) ×6 IMPLANT
APPLIER CLIP ROT 10 11.4 M/L (STAPLE) ×3
APR CLP MED LRG 11.4X10 (STAPLE) ×1
BAG SPEC RTRVL 10 TROC 200 (ENDOMECHANICALS) ×1
BENZOIN TINCTURE PRP APPL 2/3 (GAUZE/BANDAGES/DRESSINGS) IMPLANT
CABLE HIGH FREQUENCY MONO STRZ (ELECTRODE) ×3 IMPLANT
CATH REDDICK CHOLANGI 4FR 50CM (CATHETERS) ×3 IMPLANT
CLIP APPLIE ROT 10 11.4 M/L (STAPLE) ×1 IMPLANT
CLOSURE WOUND 1/2 X4 (GAUZE/BANDAGES/DRESSINGS)
COVER MAYO STAND STRL (DRAPES) ×3 IMPLANT
COVER SURGICAL LIGHT HANDLE (MISCELLANEOUS) ×3 IMPLANT
DERMABOND ADVANCED (GAUZE/BANDAGES/DRESSINGS) ×2
DERMABOND ADVANCED .7 DNX12 (GAUZE/BANDAGES/DRESSINGS) ×1 IMPLANT
DRAPE C-ARM 42X120 X-RAY (DRAPES) ×3 IMPLANT
ELECT PENCIL ROCKER SW 15FT (MISCELLANEOUS) ×2 IMPLANT
ELECT REM PT RETURN 15FT ADLT (MISCELLANEOUS) ×3 IMPLANT
GLOVE BIOGEL M 8.0 STRL (GLOVE) ×3 IMPLANT
GLOVE BIOGEL PI IND STRL 6 (GLOVE) IMPLANT
GLOVE BIOGEL PI IND STRL 6.5 (GLOVE) IMPLANT
GLOVE BIOGEL PI IND STRL 7.0 (GLOVE) IMPLANT
GLOVE BIOGEL PI INDICATOR 6 (GLOVE) ×6
GLOVE BIOGEL PI INDICATOR 6.5 (GLOVE) ×6
GLOVE BIOGEL PI INDICATOR 7.0 (GLOVE) ×4
GOWN STRL REUS W/TWL XL LVL3 (GOWN DISPOSABLE) ×13 IMPLANT
HEMOSTAT SURGICEL 4X8 (HEMOSTASIS) IMPLANT
IV CATH 14GX2 1/4 (CATHETERS) ×3 IMPLANT
KIT BASIN OR (CUSTOM PROCEDURE TRAY) ×3 IMPLANT
L-HOOK LAP DISP 36CM (ELECTROSURGICAL) ×3
LHOOK LAP DISP 36CM (ELECTROSURGICAL) IMPLANT
POUCH RETRIEVAL ECOSAC 10 (ENDOMECHANICALS) IMPLANT
POUCH RETRIEVAL ECOSAC 10MM (ENDOMECHANICALS) ×2
SCISSORS LAP 5X45 EPIX DISP (ENDOMECHANICALS) ×3 IMPLANT
SET IRRIG TUBING LAPAROSCOPIC (IRRIGATION / IRRIGATOR) ×3 IMPLANT
SLEEVE XCEL OPT CAN 5 100 (ENDOMECHANICALS) ×3 IMPLANT
STRIP CLOSURE SKIN 1/2X4 (GAUZE/BANDAGES/DRESSINGS) IMPLANT
SUT MNCRL AB 4-0 PS2 18 (SUTURE) ×3 IMPLANT
SYR 20CC LL (SYRINGE) ×3 IMPLANT
TOWEL OR 17X26 10 PK STRL BLUE (TOWEL DISPOSABLE) ×3 IMPLANT
TRAY LAPAROSCOPIC (CUSTOM PROCEDURE TRAY) ×3 IMPLANT
TROCAR BLADELESS OPT 5 100 (ENDOMECHANICALS) ×3 IMPLANT
TROCAR XCEL BLUNT TIP 100MML (ENDOMECHANICALS) IMPLANT
TROCAR XCEL NON-BLD 11X100MML (ENDOMECHANICALS) ×3 IMPLANT
TUBING INSUF HEATED (TUBING) ×3 IMPLANT

## 2018-01-12 NOTE — Anesthesia Procedure Notes (Signed)
Procedure Name: Intubation Date/Time: 01/12/2018 10:51 AM Performed by: Talbot Grumbling, CRNA Pre-anesthesia Checklist: Patient identified, Emergency Drugs available, Suction available and Patient being monitored Patient Re-evaluated:Patient Re-evaluated prior to induction Oxygen Delivery Method: Circle system utilized Preoxygenation: Pre-oxygenation with 100% oxygen Induction Type: IV induction Ventilation: Mask ventilation without difficulty Laryngoscope Size: Mac and 4 Grade View: Grade I Tube type: Oral Tube size: 7.5 mm Number of attempts: 1 Airway Equipment and Method: Stylet Placement Confirmation: ETT inserted through vocal cords under direct vision,  positive ETCO2 and breath sounds checked- equal and bilateral Secured at: 22 cm Tube secured with: Tape Dental Injury: Teeth and Oropharynx as per pre-operative assessment

## 2018-01-12 NOTE — Anesthesia Postprocedure Evaluation (Signed)
Anesthesia Post Note  Patient: Heather Sweeney  Procedure(s) Performed: LAPAROSCOPIC CHOLECYSTECTOMY WITH INTRAOPERATIVE CHOLANGIOGRAM ERAS PATHWAY (N/A )     Patient location during evaluation: PACU Anesthesia Type: General Level of consciousness: awake and sedated Pain management: pain level controlled Vital Signs Assessment: post-procedure vital signs reviewed and stable Respiratory status: spontaneous breathing Cardiovascular status: stable Postop Assessment: no apparent nausea or vomiting Anesthetic complications: no    Last Vitals:  Vitals:   01/12/18 1300 01/12/18 1315  BP: 134/79 127/81  Pulse: 72 75  Resp: (!) 22 18  Temp:    SpO2: 100% 98%    Last Pain:  Vitals:   01/12/18 1315  TempSrc:   PainSc: 4    Pain Goal: Patients Stated Pain Goal: 3 (01/12/18 0930)               Darah Simkin JR,JOHN Mateo Flow

## 2018-01-12 NOTE — Interval H&P Note (Signed)
History and Physical Interval Note:  01/12/2018 10:22 AM  Heather Sweeney Mo  has presented today for surgery, with the diagnosis of Gallstones and chronic cholecystitis  The various methods of treatment have been discussed with the patient and family. After consideration of risks, benefits and other options for treatment, the patient has consented to  Procedure(s): LAPAROSCOPIC CHOLECYSTECTOMY WITH INTRAOPERATIVE CHOLANGIOGRAM ERAS PATHWAY (N/A) as a surgical intervention .  The patient's history has been reviewed, patient examined, no change in status, stable for surgery.  I have reviewed the patient's chart and labs.  Questions were answered to the patient's satisfaction.     [name]

## 2018-01-12 NOTE — Op Note (Signed)
Heather Sweeney  Primary Care Physician:  Perrin Maltese, MD    01/12/2018  12:35 PM  Procedure: Laparoscopic Cholecystectomy with intraoperative cholangiogram  Surgeon: Catalina Antigua B. Hassell Done, MD, FACS Asst:  Romana Juniper, MD  Anes:  General  Drains:  None  Findings: Chronic cholecystitis with normal IOC  Description of Procedure: The patient was taken to OR 2 and given general anesthesia.  The patient was prepped with Technicare and draped sterilely. A time out was performed.  Access to the abdomen was achieved with a 5 mm Optiview through the left upper quadrant without difficulty.  Port placement included three five mm trocars and a 12 in the upper midline.    The gallbladder was visualized and the fundus was grasped and the gallbladder was elevated. Traction on the infundibulum allowed for successful demonstration of the critical view. Inflammatory changes were chronic .  The cystic duct was identified and clipped up on the gallbladder and an incision was made in the cystic duct and the Reddick catheter was inserted after milking the cystic duct of any debris. A dynamic cholangiogram was performed which demonstrated intrahepatic filling and free flow into the duodenum.    The cystic duct was then triple clipped and divided, the cystic artery was double clipped and divided and then the gallbladder was removed from the gallbladder bed. Removal of the gallbladder from the gallbladder bed was accomplished without entering it.  The gallbladder was then placed in a bag and brought out through one of the trocar sites. The gallbladder bed was inspected and no bleeding or bile leaks were seen.      Incisions were injected with Exparel and closed with 4-0 Monocryl and Dermabond on the skin.  Sponge and needle count were correct.    The patient was taken to the recovery room in satisfactory condition.

## 2018-01-12 NOTE — Transfer of Care (Signed)
Immediate Anesthesia Transfer of Care Note  Patient: Heather Sweeney  Procedure(s) Performed: LAPAROSCOPIC CHOLECYSTECTOMY WITH INTRAOPERATIVE CHOLANGIOGRAM ERAS PATHWAY (N/A )  Patient Location: PACU  Anesthesia Type:General  Level of Consciousness: sedated  Airway & Oxygen Therapy: Patient Spontanous Breathing and Patient connected to face mask oxygen  Post-op Assessment: Report given to RN and Post -op Vital signs reviewed and stable  Post vital signs: Reviewed and stable  Last Vitals:  Vitals Value Taken Time  BP 127/77 01/12/2018 12:40 PM  Temp    Pulse 79 01/12/2018 12:41 PM  Resp 15 01/12/2018 12:41 PM  SpO2 100 % 01/12/2018 12:41 PM  Vitals shown include unvalidated device data.  Last Pain:  Vitals:   01/12/18 0930  TempSrc: Oral  PainSc: 0-No pain      Patients Stated Pain Goal: 3 (81/84/03 7543)  Complications: No apparent anesthesia complications

## 2018-01-12 NOTE — Anesthesia Preprocedure Evaluation (Addendum)
Anesthesia Evaluation  Patient identified by MRN, date of birth, ID band Patient awake    Reviewed: Allergy & Precautions, NPO status , Patient's Chart, lab work & pertinent test results, reviewed documented beta blocker date and time   Airway Mallampati: II  TM Distance: >3 FB Neck ROM: Full    Dental  (+) Partial Lower, Partial Upper   Pulmonary sleep apnea and Continuous Positive Airway Pressure Ventilation ,    Pulmonary exam normal breath sounds clear to auscultation       Cardiovascular hypertension, Pt. on medications and Pt. on home beta blockers Normal cardiovascular exam Rhythm:Regular Rate:Normal     Neuro/Psych PSYCHIATRIC DISORDERS Depression Bipolar Disorder    GI/Hepatic GERD  Medicated and Controlled,  Endo/Other  diabetes, Type 2, Oral Hypoglycemic AgentsMorbid obesity  Renal/GU negative Renal ROS  negative genitourinary   Musculoskeletal   Abdominal (+) + obese,   Peds  Hematology   Anesthesia Other Findings   Reproductive/Obstetrics                            Anesthesia Physical Anesthesia Plan  ASA: III  Anesthesia Plan: General   Post-op Pain Management:    Induction: Intravenous  PONV Risk Score and Plan: 4 or greater and Ondansetron, Dexamethasone, Midazolam and Scopolamine patch - Pre-op  Airway Management Planned: Oral ETT  Additional Equipment:   Intra-op Plan:   Post-operative Plan: Extubation in OR  Informed Consent: I have reviewed the patients History and Physical, chart, labs and discussed the procedure including the risks, benefits and alternatives for the proposed anesthesia with the patient or authorized representative who has indicated his/her understanding and acceptance.   Dental advisory given  Plan Discussed with: CRNA and Surgeon  Anesthesia Plan Comments:         Anesthesia Quick Evaluation

## 2018-01-30 ENCOUNTER — Emergency Department: Payer: Medicare HMO

## 2018-01-30 ENCOUNTER — Other Ambulatory Visit: Payer: Self-pay

## 2018-01-30 ENCOUNTER — Encounter: Payer: Self-pay | Admitting: Emergency Medicine

## 2018-01-30 ENCOUNTER — Emergency Department
Admission: EM | Admit: 2018-01-30 | Discharge: 2018-01-30 | Disposition: A | Discharge status: Home / Self Care | Payer: Medicare HMO | Attending: Emergency Medicine | Admitting: Emergency Medicine

## 2018-01-30 DIAGNOSIS — I110 Hypertensive heart disease with heart failure: Secondary | ICD-10-CM | POA: Diagnosis not present

## 2018-01-30 DIAGNOSIS — Z7984 Long term (current) use of oral hypoglycemic drugs: Secondary | ICD-10-CM | POA: Insufficient documentation

## 2018-01-30 DIAGNOSIS — Z79899 Other long term (current) drug therapy: Secondary | ICD-10-CM | POA: Insufficient documentation

## 2018-01-30 DIAGNOSIS — Z7982 Long term (current) use of aspirin: Secondary | ICD-10-CM | POA: Diagnosis not present

## 2018-01-30 DIAGNOSIS — I509 Heart failure, unspecified: Secondary | ICD-10-CM | POA: Diagnosis not present

## 2018-01-30 DIAGNOSIS — M1611 Unilateral primary osteoarthritis, right hip: Secondary | ICD-10-CM

## 2018-01-30 DIAGNOSIS — M25551 Pain in right hip: Secondary | ICD-10-CM | POA: Diagnosis present

## 2018-01-30 MED ORDER — KETOROLAC TROMETHAMINE 60 MG/2ML IM SOLN
60.0000 mg | Freq: Once | INTRAMUSCULAR | Status: AC
  Administered 2018-01-30: 60 mg via INTRAMUSCULAR
  Filled 2018-01-30: qty 2

## 2018-01-30 MED ORDER — KETOROLAC TROMETHAMINE 10 MG PO TABS: 10.0000 mg | ORAL_TABLET | Freq: Four times a day (QID) | ORAL | 0 refills | Status: DC | PRN

## 2018-01-30 NOTE — ED Provider Notes (Signed)
[facility] Emergency Department Provider Note   ____________________________________________   First MD Initiated Contact with Patient 01/30/18 [phone]     (approximate)  I have reviewed the triage vital signs and the nursing notes.   HISTORY  Chief Complaint Back Pain    HPI Heather Sweeney is a 52 y.o. female patient complaining of right hip pain that radiates down to her feet.  Onset of complaint 1 week ago for provocative incident.  Patient denies bladder bowel dysfunction.  Patient has a history of radicular back pain with primary arthritis to the right knee.  Patient rates the pain as a 9/10.  Patient described the pain is "achy".  Patient is under pain management but states medication has not relieves her pain.  Past Medical History:  Diagnosis Date  . Allergic rhinitis   . Bipolar disorder ([place])   . CHF (congestive heart failure) ([place])   . Depression   . GERD (gastroesophageal reflux disease)   . Hypertension     Patient Active Problem List   Diagnosis Date Noted  . S/P laparoscopic cholecystectomy April 2019 01/12/2018  . Angioedema 07/07/2017  . Bilateral carpal tunnel syndrome 11/03/2016  . Frequent PVCs 09/24/2016  . Morbid obesity with BMI of 50.0-59.9, adult ([place]) 08/07/2016  . Palpitations 08/07/2016  . Chest pain 07/11/2016  . Benign essential HTN 07/10/2016  . OSA (obstructive sleep apnea) 07/10/2016    Past Surgical History:  Procedure Laterality Date  . ABDOMINAL HYSTERECTOMY  07/08/2011  . ABDOMINAL HYSTERECTOMY    . CHOLECYSTECTOMY N/A 01/12/2018   Procedure: LAPAROSCOPIC CHOLECYSTECTOMY WITH INTRAOPERATIVE CHOLANGIOGRAM ERAS PATHWAY;  Surgeon: Johnathan Hausen, MD;  Location: [facility] ORS;  Service: General;  Laterality: N/A;    Prior to Admission medications   Medication Sig Start Date End Date Taking? Authorizing Provider  amLODipine (NORVASC) 5 MG tablet Take 5 mg by mouth daily.     [provider]  aspirin  EC 81 MG EC tablet Take 1 tablet (81 mg total) by mouth daily. 07/12/16   Gladstone Lighter, MD  benztropine (COGENTIN) 1 MG tablet Take 1 mg by mouth daily as needed (anxiety).     [provider]  cetirizine (ZYRTEC) 10 MG tablet Take 10 mg by mouth daily.    [provider]  cloNIDine (CATAPRES) 0.2 MG tablet Take 0.2 mg by mouth at bedtime.    [provider]  diphenhydrAMINE (BENADRYL) 25 mg capsule Take 1 capsule (25 mg total) by mouth every 6 (six) hours. Patient not taking: Reported on 12/30/2017 07/08/17   Max Sane, MD  EPINEPHrine (EPIPEN 2-PAK) 0.3 mg/0.3 mL IJ SOAJ injection Inject 0.3 mg into the muscle daily as needed (allergic reactions).    [provider]  EPINEPHrine 0.15 MG/0.15ML IJ injection Inject 0.15 mLs (0.15 mg total) into the muscle as needed for anaphylaxis. Patient not taking: Reported on 12/30/2017 07/08/17   Max Sane, MD  FLUoxetine (PROZAC) 20 MG capsule Take 20 mg by mouth daily.    [provider]  fluPHENAZine (PROLIXIN) 2.5 MG tablet Take 2.5 mg by mouth at bedtime.     [provider]  fluticasone (FLONASE) 50 MCG/ACT nasal spray Place 2 sprays into both nostrils daily.    [provider]  gabapentin (NEURONTIN) 100 MG capsule Take 200 mg by mouth at bedtime.    [provider]  ketorolac (TORADOL) 10 MG tablet Take 1 tablet (10 mg total) by mouth every 6 (six) hours as needed. 01/30/18  Sable Feil, PA-C  liraglutide (VICTOZA) 18 MG/3ML SOPN Inject 1.8 mg into the skin daily.    [provider]  metoprolol succinate (TOPROL-XL) 50 MG 24 hr tablet Take 50 mg by mouth daily. Take with or immediately following a meal.    [provider]  oxyCODONE-acetaminophen (PERCOCET) 10-325 MG tablet Take 1 tablet by mouth every 6 (six) hours as needed for pain. 01/12/18   Johnathan Hausen, MD  pantoprazole (PROTONIX) 40 MG tablet Take 40 mg by mouth daily.    [provider]    polyethylene glycol (MIRALAX / GLYCOLAX) packet Take 17 g by mouth daily as needed for mild constipation.    [provider]  pravastatin (PRAVACHOL) 10 MG tablet Take 10 mg by mouth daily.    [provider]  solifenacin (VESICARE) 10 MG tablet Take 10 mg by mouth daily.    [provider]  spironolactone (ALDACTONE) 25 MG tablet Take 25 mg by mouth daily.    [provider]    Allergies Cephalexin; Hctz [hydrochlorothiazide]; Influenza vaccines; Penicillins; Losartan; and Lisinopril  Family History  Problem Relation Age of Onset  . Cancer Father   . Hypertension Father   . Heart failure Mother   . Hypertension Mother   . Breast cancer Sister 10    Social History Social History   Tobacco Use  . Smoking status: Never Smoker  . Smokeless tobacco: Never Used  Substance Use Topics  . Alcohol use: No    Alcohol/week: 0.0 oz  . Drug use: No    Review of Systems Constitutional: No fever/chills Eyes: No visual changes. ENT: No sore throat. Cardiovascular: Denies chest pain. Respiratory: Denies shortness of breath. Gastrointestinal: No abdominal pain.  No nausea, no vomiting.  No diarrhea.  No constipation. Genitourinary: Negative for dysuria. Musculoskeletal: Chronic back pain. Skin: Negative for rash. Neurological: Negative for headaches, focal weakness or numbness. Psychiatric:Bipolar depression Endocrine:Hypertension Allergic/Immunilogical: See medication list ____________________________________________   PHYSICAL EXAM:  VITAL SIGNS: ED Triage Vitals  Enc Vitals Group     BP 01/30/18 0709 115/74     Pulse Rate 01/30/18 0709 82     Resp 01/30/18 0709 20     Temp 01/30/18 0709 98.1 F (36.7 C)     Temp Source 01/30/18 0709 Oral     SpO2 01/30/18 0709 100 %     Weight 01/30/18 0710 220 lb (99.8 kg)     Height 01/30/18 0710 5\' 3"  (1.6 m)     Head Circumference --      Peak Flow --      Pain Score 01/30/18 0710 9     Pain  Loc --      Pain Edu? --      Excl. in [place]? --     Constitutional: Alert and oriented. Well appearing and in no acute distress. Cardiovascular: Normal rate, regular rhythm. Grossly normal heart sounds.  Good peripheral circulation. Respiratory: Normal respiratory effort.  No retractions. Lungs CTAB. Gastrointestinal: Soft and nontender. No distention. No abdominal bruits. No CVA tenderness.  Musculoskeletal: No obvious hip deformity.  No leg length discrepancy.  Patient has moderate guarding palpation of the iliac crest and the greater trochanter of the right hip and pelvis. Neurologic:  Normal speech and language. No gross focal neurologic deficits are appreciated. No gait instability. Skin:  Skin is warm, dry and intact. No rash noted. Psychiatric: Mood and affect are normal. Speech and behavior are normal.  ____________________________________________   LABS (all labs  ordered are listed, but only abnormal results are displayed)  Labs Reviewed - No data to display ____________________________________________  EKG   ____________________________________________  RADIOLOGY  Mild arthritic changes of the hip p/elvic, but no acute findings.  Official radiology report(s): Dg Hip Unilat W Or Wo Pelvis 2-3 Views Right  Result Date: 01/30/2018 CLINICAL DATA:  No known injury.  Pain for 1 week. EXAM: DG HIP (WITH OR WITHOUT PELVIS) 2-3V RIGHT COMPARISON:  None. FINDINGS: There is no evidence of hip fracture or dislocation. There is no evidence of arthropathy or other focal bone abnormality. IMPRESSION: Negative. Electronically Signed   By: Staci Righter M.D.   On: 01/30/2018 08:27    ____________________________________________   PROCEDURES  Procedure(s) performed: None  Procedures  Critical Care performed: No  ____________________________________________   INITIAL IMPRESSION / ASSESSMENT AND PLAN / ED COURSE  As part of my medical decision making, I reviewed the following  data within the electronic MEDICAL RECORD NUMBER    Right hip pain secondary to mild osteoarthritis.  Discussed x-ray findings with patient.  Patient given discharge care instruction.  Patient advised to continue her narcotic medication in the pain management.  Patient given a prescription for ketorolac after receiving IM injection prior to departure.      ____________________________________________   FINAL CLINICAL IMPRESSION(S) / ED DIAGNOSES  Final diagnoses:  Primary osteoarthritis of right hip     ED Discharge Orders        Ordered    ketorolac (TORADOL) 10 MG tablet  Every 6 hours PRN     01/30/18 0835       Note:  This document was prepared using Dragon voice recognition software and may include unintentional dictation errors.    Sable Feil, PA-C 01/30/18 8366    Schaevitz, Randall An, MD 01/30/18 [phone]

## 2018-01-30 NOTE — ED Triage Notes (Signed)
Low back radiating now to buttock and down R leg x 1 week. No known injury.

## 2018-01-30 NOTE — Discharge Instructions (Signed)
Continue previous pain medication start ketorolac as directed.

## 2018-01-30 NOTE — ED Notes (Signed)
See triage note  Presents with pain to right lateral hip area which radiates into lower leg  Denies any injury  Ambulates well to treatment room.. States she has been taking percocet w/o relief

## 2018-02-19 DIAGNOSIS — M5417 Radiculopathy, lumbosacral region: Secondary | ICD-10-CM | POA: Insufficient documentation

## 2018-03-11 ENCOUNTER — Encounter: Payer: Medicare HMO | Attending: Surgery | Admitting: Dietician

## 2018-03-11 ENCOUNTER — Encounter: Payer: Self-pay | Admitting: Dietician

## 2018-03-11 VITALS — Ht 63.0 in | Wt 228.5 lb

## 2018-03-11 DIAGNOSIS — E669 Obesity, unspecified: Secondary | ICD-10-CM | POA: Insufficient documentation

## 2018-03-11 DIAGNOSIS — Z713 Dietary counseling and surveillance: Secondary | ICD-10-CM | POA: Insufficient documentation

## 2018-03-11 DIAGNOSIS — Z6841 Body Mass Index (BMI) 40.0 and over, adult: Secondary | ICD-10-CM | POA: Diagnosis not present

## 2018-03-11 NOTE — Progress Notes (Signed)
Medical Nutrition Therapy: Visit start time: 9:10 end time:10:30am  Assessment:  Diagnosis: obesity Past medical history: Hypertensioin, sleep apnea, diabetes  Psychosocial issues/ stress concerns: none identified Preferred learning method:  . Auditory  Current weight: 228.5 lbs Height: 63 in Medications, supplements: see list  Progress and evaluation:  Patient in for initial medical nutrition therapy appointment. She reports that she weighed 299 lbs 10/2017 and began to lose weight due to stomach pain related to her gall bladder. She had a cholecystectomy 01/2018 and reports due to "my gallbadder being  attached to my intestines", surgery and recovery has been more complicated. She now has a good appetite and can tolerate most foods except high fat foods.  Her weight at the time of surgery was approximately 270 lbs and she has continued to lose weight. Her weight goal is 170 lbs.She talked to Dr. Hassell Done regarding bariatric surgery and he suggested to meet with dietitian to help with improving health and weight loss first. Presently she consistently eats 2 eggs with cheese for breakfast and 2 slices of toast. She then drinks a SlimFast at 4-5:00pm. In between, she is eating granola bars and nut/chocolate bars. She drinks 2 (20 oz) Cokes daily. She states she tolerates fruits and vegetables but her diet is low in both as well as whole grains and calcium sources. Her protein intake is also most likely below minimum recommended grams of 60 gms. Her most recent HgA1c was 5, 01/2018.  Physical activity: no structured exercise   Nutrition Care Education:  Basic nutrition:  Instructed on food group servings needed to meet basic nutrient needs. Encouraged to establish a more consistent pattern of eating. Weight control:  Instructed on a meal plan based on 1600 calories to promote weight loss including carbohydrate counting and how to better balance carbohydrate, protein and non-starchy vegetables.  Used food guide plate and food models to instruct. Discussed using SlimFast as a meal replacement (as she prefers) and including solid food at 2 meals. Discussed the 500 calories in soda she is drinking daily and the need to limit with goal of eliminating. Gave and reviewed sample menus. Also, encouraged walking for exercise if MD has approved exercise. Also, gave hand-out with arm chair exercises; both cardio and resistance   Nutritional Diagnosis:  NI-5.11.1 Predicted suboptimal nutrient intake As related to low intake of fruits, vegetables, calcium sources, whole grains and protein on some days..  As evidenced by diet history..  Intervention:  Work to establish a meal pattern of 3 meals spaced 4-5 hours apart. Can substitute SlimFast for lunch recognizing that it counts as 1 oz of protein. Include 6-7 oz of a protein food daily. Refer to list. Balance meals with 2-4 oz protein, 2-3 servings of carbohydrate (starch, fruit, lactose free milk/yogurt) and non-starchy vegetables- Refer to food plate and "Planning a Balanced Meal". Decrease soda to 10 oz daily. Include 6-8 cups of fluid (mainly water) daily. Exercise goal: Walk with husband -3 x per day (Patient's said she can do this) Work with 3-5 lb weights to tone arms. Refer to [employer] given:  . Plate Planner . Food lists/ Planning A Balanced Meal . Sample meal pattern/ menus . Arm chair exercises . Goals/ instructions Learner/ who was taught:  . Patient  Level of understanding: . Partial understanding; needs review/ practice Demonstrated degree of understanding via:   Teach back Learning barriers: . None Willingness to learn/ readiness for change: . Acceptance, ready for change  Monitoring and Evaluation:  Dietary intake, exercise,  and body weight      follow up: 04/06/2018 at 9:00am

## 2018-03-11 NOTE — Patient Instructions (Signed)
Work to establish a meal pattern of 3 meals spaced 4-5 hours apart. Can substitute SlimFast for lunch recognizing that it counts as 1 oz of protein. Include 6-7 oz of a protein food daily. Refer to list. Balance meals with 2-4 oz protein, 2-3 servings of carbohydrate (starch, fruit, lactose free milk/yogurt) and non-starchy vegetables- Refer to food plate and "Planning a Balanced Meal". Decrease soda to 10 oz daily. Include 6-8 cups of fluid (mainly water) daily. Exercise goal: Walk with husband -3 x per day (Patient's said she can do this) Work with 3-5 lb weights to tone arms. Refer to hand-out

## 2018-03-30 ENCOUNTER — Ambulatory Visit
Admission: EM | Admit: 2018-03-30 | Discharge: 2018-03-30 | Disposition: A | Discharge status: Home / Self Care | Payer: Medicare HMO | Attending: Family Medicine | Admitting: Family Medicine

## 2018-03-30 ENCOUNTER — Encounter: Payer: Self-pay | Admitting: Emergency Medicine

## 2018-03-30 ENCOUNTER — Other Ambulatory Visit: Payer: Self-pay

## 2018-03-30 DIAGNOSIS — M79604 Pain in right leg: Secondary | ICD-10-CM | POA: Insufficient documentation

## 2018-03-30 MED ORDER — ETODOLAC 400 MG PO TABS: 400.0000 mg | ORAL_TABLET | Freq: Two times a day (BID) | ORAL | 1 refills | Status: DC

## 2018-03-30 NOTE — ED Triage Notes (Signed)
Patient c/o pain in her right leg off and on for 3-4 months.  Patient has been doing physical therapy and was recommended to come her if the pain continues.

## 2018-03-30 NOTE — Discharge Instructions (Signed)
See your orthopedist or pain specialist.  Use the brace as needed.  Use the medication (Lodine) as needed. It should help.  Take care  Dr. Lacinda Axon

## 2018-03-30 NOTE — ED Provider Notes (Signed)
[facility]    CSN: [id number] Arrival date & time: 03/30/18  0841   History   Chief Complaint Chief Complaint  Patient presents with  . Leg Pain    right   HPI   52 year old female presents with right knee pain and left leg numbness.  This appears to be a chronic issue.  She states that this been going on for the past 3 to 4 months.  She has seen an orthopedist and is currently doing physical therapy.  Patient states that she was told by her physical therapist that if she continues to have pain she should  that urgent care.  She presents today for evaluation.  Her pain is moderate in severity.  She is currently concerned about the pain of her right knee.  She states that it is swollen.  She also reports her right leg numbness which starts in the buttock and goes all the way down to her toes.  No reports of back pain.  No recent fall, trauma, injury.  She states that she was recently taking Percocet for her pain.  No known exacerbating factors.  No other associated symptoms.  No other complaints.  Past Medical History:  Diagnosis Date  . Allergic rhinitis   . Bipolar disorder ([place])   . CHF (congestive heart failure) ([place])   . Depression   . GERD (gastroesophageal reflux disease)   . Hypertension    Patient Active Problem List   Diagnosis Date Noted  . S/P laparoscopic cholecystectomy April 2019 01/12/2018  . Angioedema 07/07/2017  . Bilateral carpal tunnel syndrome 11/03/2016  . Frequent PVCs 09/24/2016  . Morbid obesity with BMI of 50.0-59.9, adult ([place]) 08/07/2016  . Palpitations 08/07/2016  . Chest pain 07/11/2016  . Benign essential HTN 07/10/2016  . OSA (obstructive sleep apnea) 07/10/2016   Past Surgical History:  Procedure Laterality Date  . ABDOMINAL HYSTERECTOMY  07/08/2011  . ABDOMINAL HYSTERECTOMY    . CHOLECYSTECTOMY N/A 01/12/2018   Procedure: LAPAROSCOPIC CHOLECYSTECTOMY WITH INTRAOPERATIVE CHOLANGIOGRAM ERAS PATHWAY;  Surgeon: Johnathan Hausen,  MD;  Location: [facility];  Service: General;  Laterality: N/A;   OB History   None    Home Medications    Prior to Admission medications   Medication Sig Start Date End Date Taking? Authorizing Provider  amLODipine (NORVASC) 5 MG tablet Take 5 mg by mouth daily.    Yes [provider]  aspirin EC 81 MG EC tablet Take 1 tablet (81 mg total) by mouth daily. 07/12/16  Yes Gladstone Lighter, MD  benztropine (COGENTIN) 1 MG tablet Take 1 mg by mouth daily as needed (anxiety).    Yes [provider]  cetirizine (ZYRTEC) 10 MG tablet Take 10 mg by mouth daily.   Yes [provider]  cloNIDine (CATAPRES) 0.2 MG tablet Take 0.2 mg by mouth at bedtime.   Yes [provider]  diphenhydrAMINE (BENADRYL) 25 mg capsule Take 1 capsule (25 mg total) by mouth every 6 (six) hours. 07/08/17  Yes Max Sane, MD  FLUoxetine (PROZAC) 20 MG capsule Take 20 mg by mouth daily.   Yes [provider]  fluPHENAZine (PROLIXIN) 2.5 MG tablet Take 2.5 mg by mouth at bedtime.    Yes [provider]  fluticasone (FLONASE) 50 MCG/ACT nasal spray Place 2 sprays into both nostrils daily.   Yes [provider]  gabapentin (NEURONTIN) 100 MG capsule Take 200 mg by mouth at bedtime.   Yes [provider]  liraglutide (VICTOZA) 18 MG/3ML  SOPN Inject 1.8 mg into the skin daily.   Yes [provider]  metoprolol succinate (TOPROL-XL) 50 MG 24 hr tablet Take 50 mg by mouth daily. Take with or immediately following a meal.   Yes [provider]  oxyCODONE-acetaminophen (PERCOCET) 10-325 MG tablet Take 1 tablet by mouth every 6 (six) hours as needed for pain. 01/12/18  Yes Johnathan Hausen, MD  pantoprazole (PROTONIX) 40 MG tablet Take 40 mg by mouth daily.   Yes [provider]  pravastatin (PRAVACHOL) 10 MG tablet Take 10 mg by mouth daily.   Yes [provider]  solifenacin (VESICARE) 10 MG tablet Take 10 mg by mouth daily.   Yes  [provider]  spironolactone (ALDACTONE) 25 MG tablet Take 25 mg by mouth daily.   Yes [provider]  EPINEPHrine (EPIPEN 2-PAK) 0.3 mg/0.3 mL IJ SOAJ injection Inject 0.3 mg into the muscle daily as needed (allergic reactions).    [provider]  etodolac (LODINE) 400 MG tablet Take 1 tablet (400 mg total) by mouth 2 (two) times daily. 03/30/18   Coral Spikes, DO  polyethylene glycol (MIRALAX / GLYCOLAX) packet Take 17 g by mouth daily as needed for mild constipation.    [provider]    Family History Family History  Problem Relation Age of Onset  . Cancer Father   . Hypertension Father   . Heart failure Mother   . Hypertension Mother   . Breast cancer Sister 66    Social History Social History   Tobacco Use  . Smoking status: Never Smoker  . Smokeless tobacco: Current User    Types: Snuff  . Tobacco comment: Is in process of decreasing amount  Substance Use Topics  . Alcohol use: No    Alcohol/week: 0.0 oz  . Drug use: No     Allergies   Cephalexin; Hctz [hydrochlorothiazide]; Influenza vaccines; Penicillins; Losartan; and Lisinopril   Review of Systems Review of Systems  Musculoskeletal:       Right knee pain, swelling.  Neurological: Positive for numbness.   Physical Exam Triage Vital Signs ED Triage Vitals  Enc Vitals Group     BP 03/30/18 0854 123/80     Pulse Rate 03/30/18 0854 75     Resp 03/30/18 0854 16     Temp 03/30/18 0854 98.4 F (36.9 C)     Temp Source 03/30/18 0854 Oral     SpO2 03/30/18 0854 99 %     Weight 03/30/18 0850 225 lb (102.1 kg)     Height 03/30/18 0850 5\' 3"  (1.6 m)     Head Circumference --      Peak Flow --      Pain Score 03/30/18 0850 9     Pain Loc --      Pain Edu? --      Excl. in [place]? --    Updated Vital Signs BP 123/80 (BP Location: Left Arm)   Pulse 75   Temp 98.4 F (36.9 C) (Oral)   Resp 16   Ht 5\' 3"  (1.6 m)   Wt 225 lb (102.1 kg)   SpO2 99%   BMI 39.86 kg/m    Physical Exam  Constitutional: She is oriented to person, place, and time. She appears well-developed. No distress.  Cardiovascular: Normal rate and regular rhythm.  Pulmonary/Chest: Effort normal. She has no wheezes. She has no rales.  Musculoskeletal:  Right knee -no appreciable swelling.  No discrete areas of tenderness.  Ligaments  intact.  Lumbar spine -no discrete areas of tenderness.  Right hip -fair range of motion.  No discrete areas of tenderness.  Neurological: She is alert and oriented to person, place, and time.  Psychiatric: She has a normal mood and affect. Her behavior is normal.  Nursing note and vitals reviewed.  UC Treatments / Results  Labs (all labs ordered are listed, but only abnormal results are displayed) Labs Reviewed - No data to display  EKG None  Radiology No results found.  Procedures Procedures (including critical care time)  Medications Ordered in UC Medications - No data to display  Initial Impression / Assessment and Plan / UC Course  I have reviewed the triage vital signs and the nursing notes.  Pertinent labs & imaging results that were available during my care of the patient were reviewed by me and considered in my medical decision making (see chart for details).    52 year old female presents with chronic leg pain.  Patient had an MRI of her right knee last year.  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  MRI of Lumbar spine in 2017 showed no canal stenosis or neuroforaminal narrowing.  Patient requested a knee brace today and this was provided.  She endorsed improvement in her discomfort with the knee brace.  Trial of etodolac for her pain.  Advised to follow-up with her specialist.  Final Clinical Impressions(s) / UC Diagnoses   Final diagnoses:  Right leg pain     Discharge Instructions     See your orthopedist or pain specialist.  Use the brace as needed.  Use the medication (Lodine)  as needed. It should help.  Take care  Dr. Lacinda Axon    ED Prescriptions    Medication Sig Dispense Auth. Provider   etodolac (LODINE) 400 MG tablet Take 1 tablet (400 mg total) by mouth 2 (two) times daily. 60 tablet Coral Spikes, DO     Controlled Substance Prescriptions [place] Controlled Substance Registry consulted? Yes, I have consulted the [place] Controlled Substances Registry for this patient. No controlled substances prescribed. Patient received 90 Percocet earlier this month.   Thersa Salt [place], [place] 03/30/18 [phone]

## 2018-04-06 ENCOUNTER — Ambulatory Visit: Payer: Medicare HMO | Admitting: Dietician

## 2018-04-07 ENCOUNTER — Other Ambulatory Visit: Payer: Self-pay | Admitting: Internal Medicine

## 2018-04-07 DIAGNOSIS — Z1231 Encounter for screening mammogram for malignant neoplasm of breast: Secondary | ICD-10-CM

## 2018-04-22 ENCOUNTER — Encounter: Payer: Self-pay | Admitting: Dietician

## 2018-08-19 ENCOUNTER — Encounter: Payer: Self-pay | Admitting: Podiatry

## 2018-08-19 ENCOUNTER — Ambulatory Visit (INDEPENDENT_AMBULATORY_CARE_PROVIDER_SITE_OTHER): Payer: Medicare HMO | Admitting: Podiatry

## 2018-08-19 DIAGNOSIS — M79676 Pain in unspecified toe(s): Secondary | ICD-10-CM

## 2018-08-19 DIAGNOSIS — L600 Ingrowing nail: Secondary | ICD-10-CM

## 2018-08-19 DIAGNOSIS — B351 Tinea unguium: Secondary | ICD-10-CM | POA: Diagnosis not present

## 2018-08-19 NOTE — Progress Notes (Signed)
Complaint:  Visit Type: Patient returns to my office for  preventative foot care services. Complaint: Patient states she is having pain along the inside of her right big toe.  She says she has pain walking and wearing her shoes.  She says she has been altering her gait .  She says she has provided no self or professional help.  She presents for evaluation and treatment.  Podiatric Exam: Vascular: dorsalis pedis and posterior tibial pulses are palpable bilateral. Capillary return is immediate. Temperature gradient is WNL. Skin turgor WNL  Sensorium: Normal Semmes Weinstein monofilament test. Normal tactile sensation bilaterally. Nail Exam: Pt has thick disfigured discolored nails with subungual debris noted bilateral entire nail hallux through fifth toenails.  Pincer hallux nail right with marked incurvation medial border.  No signs of redness or swelling or drainage.  Callus along the medial border right hallux. Ulcer Exam: There is no evidence of ulcer or pre-ulcerative changes or infection. Orthopedic Exam: Muscle tone and strength are WNL. No limitations in general ROM. No crepitus or effusions noted. Foot type and digits show no abnormalities. Mild HAV  B/L   Skin: No Porokeratosis. No infection or ulcers  Diagnosis: Onychomycosis right hallux  Pincer nail right hallux  Ingrown medial border right hallux.   Treatment & Plan Procedures and Treatment  Treatment options and alternatives discussed.  Recommended permanent phenol matrixectomy and patient agreed.  Right hallux  was prepped with alcohol and a toe block of 3cc of 2% lidocaine plain was administered in a digital toe block. .  The toe was then prepped with betadine solution . A tourniquet was applied to toe. The offending nail border was then excised and matrix tissue exposed.  Phenol was then applied to the matrix tissue followed by an alcohol wash.  Antibiotic ointment and a dry sterile dressing was applied.  The patient was dispensed  instructions for aftercare. RTC 10 days.         Gardiner Barefoot DPM

## 2018-08-30 ENCOUNTER — Ambulatory Visit (INDEPENDENT_AMBULATORY_CARE_PROVIDER_SITE_OTHER): Payer: Medicare HMO | Admitting: Podiatry

## 2018-08-30 ENCOUNTER — Encounter: Payer: Self-pay | Admitting: Podiatry

## 2018-08-30 DIAGNOSIS — Z09 Encounter for follow-up examination after completed treatment for conditions other than malignant neoplasm: Secondary | ICD-10-CM

## 2018-08-30 NOTE — Progress Notes (Signed)
This patient returns to the office following nail surgery ten days  ago.  The patient says toe has been soaked and bandaged as directed.  There has been improvement of the toe since the surgery has been performed. The patient presents for continued evaluation and treatment. There is blackened color at the base of the surgical site.  GENERAL APPEARANCE: Alert, conversant. Appropriately groomed. No acute distress.  VASCULAR: Pedal pulses palpable at  [facility] and PT bilateral.  Capillary refill time is immediate to all digits,  Normal temperature gradient.    NEUROLOGIC: sensation is normal to 5.07 monofilament at 5/5 sites bilateral.  Light touch is intact bilateral, Muscle strength normal.  MUSCULOSKELETAL: acceptable muscle strength, tone and stability bilateral.  Intrinsic muscluature intact bilateral.  Rectus appearance of foot and digits noted bilateral.   DERMATOLOGIC: skin color, texture, and turgor are within normal limits.  No preulcerative lesions or ulcers  are seen, no interdigital maceration noted.   NAILS  There is necrotic tissue along the nail groove  In the absence of redness swelling and pain. Callus along nail groove.  Post inflammatory dermatitis.  DX  S/p nail surgery  ROV  Home instructions were discussed.  Patient to call the office if there are any questions or concerns.   Gardiner Barefoot DPM

## 2018-08-31 ENCOUNTER — Telehealth: Payer: Self-pay | Admitting: Internal Medicine

## 2018-08-31 ENCOUNTER — Ambulatory Visit (INDEPENDENT_AMBULATORY_CARE_PROVIDER_SITE_OTHER): Payer: Medicare HMO | Admitting: Internal Medicine

## 2018-08-31 ENCOUNTER — Encounter: Payer: Self-pay | Admitting: Internal Medicine

## 2018-08-31 ENCOUNTER — Encounter

## 2018-08-31 VITALS — BP 122/64 | HR 79 | Temp 98.3°F | Ht 63.0 in | Wt 238.0 lb

## 2018-08-31 DIAGNOSIS — J309 Allergic rhinitis, unspecified: Secondary | ICD-10-CM | POA: Insufficient documentation

## 2018-08-31 DIAGNOSIS — Z1231 Encounter for screening mammogram for malignant neoplasm of breast: Secondary | ICD-10-CM

## 2018-08-31 DIAGNOSIS — G8929 Other chronic pain: Secondary | ICD-10-CM

## 2018-08-31 DIAGNOSIS — R7303 Prediabetes: Secondary | ICD-10-CM

## 2018-08-31 DIAGNOSIS — Z1211 Encounter for screening for malignant neoplasm of colon: Secondary | ICD-10-CM | POA: Diagnosis not present

## 2018-08-31 DIAGNOSIS — I10 Essential (primary) hypertension: Secondary | ICD-10-CM | POA: Diagnosis not present

## 2018-08-31 DIAGNOSIS — Z9989 Dependence on other enabling machines and devices: Secondary | ICD-10-CM

## 2018-08-31 DIAGNOSIS — E785 Hyperlipidemia, unspecified: Secondary | ICD-10-CM

## 2018-08-31 DIAGNOSIS — G4733 Obstructive sleep apnea (adult) (pediatric): Secondary | ICD-10-CM

## 2018-08-31 MED ORDER — LIRAGLUTIDE -WEIGHT MANAGEMENT 18 MG/3ML ~~LOC~~ SOPN: 2.4000 mg | PEN_INJECTOR | Freq: Every day | SUBCUTANEOUS | 2 refills | Status: DC

## 2018-08-31 MED ORDER — INSULIN PEN NEEDLE 30G X 8 MM MISC: 3 refills | Status: DC

## 2018-08-31 MED ORDER — LOSARTAN POTASSIUM 100 MG PO TABS: 100.0000 mg | ORAL_TABLET | Freq: Every day | ORAL | 3 refills | Status: DC

## 2018-08-31 MED ORDER — FLUTICASONE PROPIONATE 50 MCG/ACT NA SUSP: 2.0000 | Freq: Every day | NASAL | 11 refills | Status: DC

## 2018-08-31 NOTE — Telephone Encounter (Signed)
Copied from [place] [id number]. Topic: Quick Communication - Rx Refill/Question >> Aug 31, 2018 12:01 PM Margot Ables wrote: Medication: pt states at appt this morning she was advised RX for [place] would be sent to the pharmacy. She went to pharmacy and they have not received it. Please advise. Ok to leave msg.  Has the patient contacted their pharmacy? Yes Preferred Pharmacy (with phone number or street name): [facility] [address] ([address]), [place] - [address] [place] ([place]) [place] [address] Phone: [phone] Fax: [phone]

## 2018-08-31 NOTE — Progress Notes (Addendum)
Chief Complaint  Patient presents with  . Establish Care   Follow up former Dr. Kelly [facility] pt [facility]   1. HTN On norvasc 5 mg qam, losartan 100 mg qd, toprol xl 25 mg qd, Spironolactone 25 mg qd. She c/o positional dizziness at times BP controlled today 2. Chronic pain f/u pain clinic in [place] on 2 different dose of gabapentin 300 tid and 200 mg qhs  3. Overactive bladder on vesicare controlled  4. Allergies needs refill of flonase 5. Obesity BMI 42.16 due to bipolar and mood d/o unable to use certain stimulants on victoza 1.8 mg qd for prediabetes will try to get [place] approved    Review of Systems  Constitutional: Negative for weight loss.  HENT: Negative for hearing loss.   Eyes: Negative for blurred vision.  Respiratory: Negative for shortness of breath.   Cardiovascular: Negative for chest pain.  Gastrointestinal: Negative for abdominal pain, nausea and vomiting.  Musculoskeletal: Negative for falls.  Skin: Negative for rash.  Neurological: Positive for dizziness.  Psychiatric/Behavioral: Negative for depression.   Past Medical History:  Diagnosis Date  . Allergic rhinitis   . Bipolar disorder ([place])   . Chronic low back pain   . CTS (carpal tunnel syndrome)    hands  . Depression   . Gallstones   . GERD (gastroesophageal reflux disease)   . Hypertension   . Laceration of right foot   . Onychomycosis   . OSA on CPAP   . Overactive bladder   . PVC's (premature ventricular contractions)   . Seizures ([place])    8th grade   Past Surgical History:  Procedure Laterality Date  . ABDOMINAL HYSTERECTOMY  07/08/2011  . ABDOMINAL HYSTERECTOMY     supracervical in 07/2011   . CARPAL TUNNEL RELEASE     right hand 1996  . CESAREAN SECTION     2012  . CHOLECYSTECTOMY N/A 01/12/2018   Procedure: LAPAROSCOPIC CHOLECYSTECTOMY WITH INTRAOPERATIVE CHOLANGIOGRAM ERAS PATHWAY;  Surgeon: Johnathan Hausen, MD;  Location: [facility];  Service: General;  Laterality: N/A;  . DILATION AND CURETTAGE  OF UTERUS     04/2011 benign endometrial polyp squamous metaplasia, inflamm, blood, mucous, benign   . FOOT SURGERY     Dr. Prudence Davidson   . LEEP     2006  . TUBAL LIGATION     Family History  Problem Relation Age of Onset  . Cancer Father        esophageal   . Hypertension Father   . Heart failure Mother   . Hypertension Mother   . Early death Mother   . Heart disease Mother        chf  . Breast cancer Sister 27  . Cancer Sister        breast  . Depression Sister   . Mental illness Sister   . Asthma Son   . Cancer Other        brain cancer   Social History   Socioeconomic History  . Marital status: Married    Spouse name: Not on file  . Number of children: Not on file  . Years of education: Not on file  . Highest education level: Not on file  Occupational History  . Not on file  Social Needs  . Financial resource strain: Not on file  . Food insecurity:    Worry: Not on file    Inability: Not on file  . Transportation needs:    Medical: Not on file  Non-medical: Not on file  Tobacco Use  . Smoking status: Never Smoker  . Smokeless tobacco: Current User    Types: Snuff  . Tobacco comment: Is in process of decreasing amount  Substance and Sexual Activity  . Alcohol use: No    Alcohol/week: 0.0 standard drinks  . Drug use: No  . Sexual activity: Not Currently    Comment: men  Lifestyle  . Physical activity:    Days per week: Not on file    Minutes per session: Not on file  . Stress: Not on file  Relationships  . Social connections:    Talks on phone: Not on file    Gets together: Not on file    Attends religious service: Not on file    Active member of club or organization: Not on file    Attends meetings of clubs or organizations: Not on file    Relationship status: Not on file  . Intimate partner violence:    Fear of current or ex partner: Not on file    Emotionally abused: Not on file    Physically abused: Not on file    Forced sexual activity: Not  on file  Other Topics Concern  . Not on file  Social History Narrative   Married    No guns    Wears seat belt    Safe in relationship    Never smoker    2 sons Gaspar Bidding ~8 and another son ~26 as of 08/2018     2 sisters    Unemployed       Current Meds  Medication Sig  . amLODipine (NORVASC) 5 MG tablet Take 5 mg by mouth daily.   Marland Kitchen aspirin EC 81 MG EC tablet Take 1 tablet (81 mg total) by mouth daily.  . benztropine (COGENTIN) 1 MG tablet Take 1 mg by mouth daily as needed (anxiety).   . cetirizine (ZYRTEC) 10 MG tablet Take 10 mg by mouth daily.  Marland Kitchen FLUoxetine (PROZAC) 20 MG capsule Take 20 mg by mouth daily.  Marland Kitchen gabapentin (NEURONTIN) 300 MG capsule Take 300 mg by mouth 3 (three) times daily.  Marland Kitchen losartan (COZAAR) 100 MG tablet Take 1 tablet (100 mg total) by mouth daily.  . meloxicam (MOBIC) 15 MG tablet meloxicam 15 mg tablet  Take 1 tablet(s) every day by oral route with meals.  . metoprolol succinate (TOPROL-XL) 25 MG 24 hr tablet Take 25 mg by mouth daily. Take with or immediately following a meal.   . pantoprazole (PROTONIX) 40 MG tablet Take 40 mg by mouth daily.  . pravastatin (PRAVACHOL) 10 MG tablet Take 10 mg by mouth daily.  Marland Kitchen spironolactone (ALDACTONE) 25 MG tablet Take 12.5 mg by mouth daily.    Allergies  Allergen Reactions  . Cephalexin Swelling and Rash  . Hctz [Hydrochlorothiazide] Rash and Swelling    Mild she says  . Influenza Vaccines Anaphylaxis    Angioedema led to hospitalization 07/2017  . Penicillins Rash    Has patient had a PCN reaction causing immediate rash, facial/tongue/throat swelling, SOB or lightheadedness with hypotension: Yes Has patient had a PCN reaction causing severe rash involving mucus membranes or skin necrosis: No Has patient had a PCN reaction that required hospitalization: No Has patient had a PCN reaction occurring within the last 10 years: No If all of the above answers are "NO", then may proceed with Cephalosporin use.   .  Losartan Swelling    Angioedema  . Lisinopril Hives and Other (  See Comments)    Headache, Low BP   No results found for this or any previous visit (from the past 2160 hour(s)). Objective  Body mass index is 42.16 kg/m. Wt Readings from Last 3 Encounters:  08/31/18 238 lb (108 kg)  03/30/18 225 lb (102.1 kg)  03/11/18 228 lb 8 oz (103.6 kg)   Temp Readings from Last 3 Encounters:  08/31/18 98.3 F (36.8 C) (Oral)  03/30/18 98.4 F (36.9 C) (Oral)  01/30/18 98.1 F (36.7 C) (Oral)   BP Readings from Last 3 Encounters:  08/31/18 122/64  03/30/18 123/80  01/30/18 115/74   Pulse Readings from Last 3 Encounters:  08/31/18 79  03/30/18 75  01/30/18 82    Physical Exam  Constitutional: She is oriented to person, place, and time. Vital signs are normal. She appears well-developed and well-nourished. She is cooperative.  HENT:  Head: Normocephalic and atraumatic.  Mouth/Throat: Oropharynx is clear and moist and mucous membranes are normal.  Eyes: Pupils are equal, round, and reactive to light. Conjunctivae are normal.  Cardiovascular: Normal rate, regular rhythm and normal heart sounds.  Pulmonary/Chest: Effort normal and breath sounds normal.  Neurological: She is alert and oriented to person, place, and time. Gait normal.  Skin: Skin is warm, dry and intact.  Psychiatric: She has a normal mood and affect. Her speech is normal and behavior is normal. Judgment and thought content normal. Cognition and memory are normal.  Nursing note and vitals reviewed.   Assessment   1. HTN controlled,  h/o HLD and prediabetes and OSA on cpap  2. Chronic pain  3. Overactive bladder  4. Allergic rhinitis  5. Obesity, morbid bmi 42.16  6. HM Plan   1.  On norvasc 5 mg qam, losartan 100 mg qd (refilled), toprol xl 25 mg qd, Spironolactone 25 mg qd  -reduce spironolactone to 12.5 mg qd  Cont cpap  2. Was on gabapentin 300 tid with pain clinic and former [facility] gave 200 mg to take qhs  with this will stop extra dose.  F/u with pain clinic in [place]  3. vesicare 10 mg qd  4. Refilled flonase  5. Try to get saxenda approved on vicotoza 1.8 mg qd start at 2.4 saxenda if approved increase to 3.0 after 1 week  Tried adipex 37.5 in the past 12/2016  Due to psych history unable to use certain meds for wt loss but she has hLD, HTN, morbid obesity, OSA on cpap and prediabetes  6.  Declines flu shot  Tdap utd due 04/18/2019  Allergic to pna vaccine Consider shingrix but given allergies use caution   LMP 07/18/11 pap check old records, reviwed notes h/o supracervical hysterectomy ([facility]), pap 02/29/16 negative no comment HPV, another pap 06/23/17 neg pap neg HPV  -h/o LEEP 2006 Labs  Had 10/09/17 CMET, lipid LDL 114, TG 107, TC 183, HDL 48, A1C 5.1, labs 03/17/18 CMET normal, lipid TC 127, TG 110, HDL 41, LDL 64, A1C 5.2 vitamin D 27.6, CBC normal, labs had 07/14/18 tc 126, hdl 46, ldl 61, tg 95 A1C 5.0, TSH 1.570, vit d 47.4, B12 778, uric acid 3.1  mammo due referred today pt to call to schedule  Colonoscopy referred    Echo Dr. Raliegh Ip nl LV function EF 60% moderate mitral insuff and mild tricuspid insufficiency treadmill stress test neg 07/30/13   Review[facility] records  Podiatry Dr. Prudence Davidson saw 08/30/18 s/p foot surgery for toenail fungus  A1C 02/04/16 5.9  HIV neg 02/04/16  Provider: Dr. Olivia Mackie [facility]

## 2018-08-31 NOTE — Telephone Encounter (Signed)
I have spoke with Dr. Olivia Mackie & she will be sending this.

## 2018-08-31 NOTE — Patient Instructions (Addendum)
Saxenda for weight loss =cousin of victoza  -if approved increase to 2.4 x 1 week then increase to 3.0 -3.0 injection daily is weight loss dose   Cut spironolactone in 1/2 pill 25 mg cut in 1/2 to equal 12.5 mg daily in the am     Liraglutide injection (Weight Management) What is this medicine? LIRAGLUTIDE (LIR a GLOO tide) is used with a reduced calorie diet and exercise to help you lose weight. This medicine may be used for other purposes; ask your health care provider or pharmacist if you have questions. COMMON BRAND NAME(S): Saxenda What should I tell my health care provider before I take this medicine? They need to know if you have any of these conditions: -endocrine tumors (MEN 2) or if someone in your family had these tumors -gallbladder disease -high cholesterol -history of alcohol abuse problem -history of pancreatitis -kidney disease or if you are on dialysis -liver disease -previous swelling of the tongue, face, or lips with difficulty breathing, difficulty swallowing, hoarseness, or tightening of the throat -stomach problems -suicidal thoughts, plans, or attempt; a previous suicide attempt by you or a family member -thyroid cancer or if someone in your family had thyroid cancer -an unusual or allergic reaction to liraglutide, other medicines, foods, dyes, or preservatives -pregnant or trying to get pregnant -breast-feeding How should I use this medicine? This medicine is for injection under the skin of your upper leg, stomach area, or upper arm. You will be taught how to prepare and give this medicine. Use exactly as directed. Take your medicine at regular intervals. Do not take it more often than directed. It is important that you put your used needles and syringes in a special sharps container. Do not put them in a trash can. If you do not have a sharps container, call your pharmacist or healthcare provider to get one. A special MedGuide will be given to you by the  pharmacist with each prescription and refill. Be sure to read this information carefully each time. Talk to your pediatrician regarding the use of this medicine in children. Special care may be needed. Overdosage: If you think you have taken too much of this medicine contact a poison control center or emergency room at once. NOTE: This medicine is only for you. Do not share this medicine with others. What if I miss a dose? If you miss a dose, take it as soon as you can. If it is almost time for your next dose, take only that dose. Do not take double or extra doses. If you miss your dose for 3 days or more, call your doctor or health care professional to talk about how to restart this medicine. What may interact with this medicine? -insulin and other medicines for diabetes This list may not describe all possible interactions. Give your health care provider a list of all the medicines, herbs, non-prescription drugs, or dietary supplements you use. Also tell them if you smoke, drink alcohol, or use illegal drugs. Some items may interact with your medicine. What should I watch for while using this medicine? Visit your doctor or health care professional for regular checks on your progress. This medicine is intended to be used in addition to a healthy diet and appropriate exercise. The best results are achieved this way. Do not increase or in any way change your dose without consulting your doctor or health care professional. Drink plenty of fluids while taking this medicine. Check with your doctor or health care professional if  you get an attack of severe diarrhea, nausea, and vomiting. The loss of too much body fluid can make it dangerous for you to take this medicine. This medicine may affect blood sugar levels. If you have diabetes, check with your doctor or health care professional before you change your diet or the dose of your diabetic medicine. Patients and their families should watch out for  worsening depression or thoughts of suicide. Also watch out for sudden changes in feelings such as feeling anxious, agitated, panicky, irritable, hostile, aggressive, impulsive, severely restless, overly excited and hyperactive, or not being able to sleep. If this happens, especially at the beginning of treatment or after a change in dose, call your health care professional. What side effects may I notice from receiving this medicine? Side effects that you should report to your doctor or health care professional as soon as possible: -allergic reactions like skin rash, itching or hives, swelling of the face, lips, or tongue -breathing problems -diarrhea that continues or is severe -lump or swelling on the neck -severe nausea -signs and symptoms of infection like fever or chills; cough; sore throat; pain or trouble passing urine -signs and symptoms of low blood sugar such as feeling anxious, confusion, dizziness, increased hunger, unusually weak or tired, sweating, shakiness, cold, irritable, headache, blurred vision, fast heartbeat, loss of consciousness -signs and symptoms of kidney injury like trouble passing urine or change in the amount of urine -trouble swallowing -unusual stomach upset or pain -vomiting Side effects that usually do not require medical attention (report to your doctor or health care professional if they continue or are bothersome): -constipation -decreased appetite -diarrhea -fatigue -headache -nausea -pain, redness, or irritation at site where injected -stomach upset -stuffy or runny nose This list may not describe all possible side effects. Call your doctor for medical advice about side effects. You may report side effects to FDA at 1-800-FDA-1088. Where should I keep my medicine? Keep out of the reach of children. Store unopened pen in a refrigerator between 2 and 8 degrees C (36 and 46 degrees F). Do not freeze or use if the medicine has been frozen. Protect from  light and excessive heat. After you first use the pen, it can be stored at room temperature between 15 and 30 degrees C (59 and 86 degrees F) or in a refrigerator. Throw away your used pen after 30 days or after the expiration date, whichever comes first. Do not store your pen with the needle attached. If the needle is left on, medicine may leak from the pen. NOTE: This sheet is a summary. It may not cover all possible information. If you have questions about this medicine, talk to your doctor, pharmacist, or health care provider.  2018 Elsevier/Gold Standard (2016-10-09 14:41:37)

## 2018-08-31 NOTE — Progress Notes (Signed)
Pre visit review using our clinic review tool, if applicable. No additional management support is needed unless otherwise documented below in the visit note. 

## 2018-09-01 ENCOUNTER — Encounter: Payer: Self-pay | Admitting: Internal Medicine

## 2018-09-01 DIAGNOSIS — R7303 Prediabetes: Secondary | ICD-10-CM

## 2018-09-01 DIAGNOSIS — E785 Hyperlipidemia, unspecified: Secondary | ICD-10-CM | POA: Insufficient documentation

## 2018-09-01 DIAGNOSIS — Z87898 Personal history of other specified conditions: Secondary | ICD-10-CM | POA: Insufficient documentation

## 2018-09-08 ENCOUNTER — Telehealth: Payer: Self-pay | Admitting: Internal Medicine

## 2018-09-08 ENCOUNTER — Encounter: Payer: Self-pay | Admitting: Internal Medicine

## 2018-09-08 NOTE — Telephone Encounter (Signed)
Call pt reviewed [facility] records no need for labs currently she last had them 07/2018   Thanks [place]

## 2018-09-10 DIAGNOSIS — E119 Type 2 diabetes mellitus without complications: Secondary | ICD-10-CM | POA: Diagnosis not present

## 2018-09-10 DIAGNOSIS — I10 Essential (primary) hypertension: Secondary | ICD-10-CM | POA: Diagnosis not present

## 2018-09-14 ENCOUNTER — Other Ambulatory Visit: Payer: Self-pay

## 2018-09-14 DIAGNOSIS — Z1211 Encounter for screening for malignant neoplasm of colon: Secondary | ICD-10-CM

## 2018-09-24 ENCOUNTER — Other Ambulatory Visit: Payer: Self-pay

## 2018-09-24 MED ORDER — NA SULFATE-K SULFATE-MG SULF 17.5-3.13-1.6 GM/177ML PO SOLN: 1.0000 | Freq: Once | ORAL | 0 refills | Status: AC

## 2018-09-24 NOTE — Progress Notes (Signed)
Pt called for clarification on when to fill Suprep Bowel prep prescription and when to begin drinking the bowel prep. I explained to pt to begin the bowel prep on Sunday evening at 5 pm. Pt requested that we call in the prescription for Suprep to her preferred pharmacy. The prescription has been sent.

## 2018-09-27 ENCOUNTER — Ambulatory Visit: Admission: RE | Admit: 2018-09-27 | Payer: Medicare HMO | Source: Ambulatory Visit | Admitting: Gastroenterology

## 2018-09-27 ENCOUNTER — Encounter: Payer: Self-pay | Admitting: Anesthesiology

## 2018-09-27 ENCOUNTER — Telehealth: Payer: Self-pay | Admitting: *Deleted

## 2018-09-27 ENCOUNTER — Encounter: Admission: RE | Payer: Self-pay | Source: Ambulatory Visit

## 2018-09-27 SURGERY — COLONOSCOPY WITH PROPOFOL
Anesthesia: General

## 2018-09-27 NOTE — Telephone Encounter (Signed)
Copied from [place]. Topic: General - Other >> Sep 27, 2018  9:40 AM Yvette Rack wrote: Reason for CRM: pt calling stating that she was suppose to have had a colonoscopy today but they told her that she ned another referral for a COLONOSCOPY WITH PROPOFOL  because she didn't follow the steps that she was suppose to do to get it done

## 2018-09-27 NOTE — Telephone Encounter (Signed)
See GI referral and make appt   [place]

## 2018-10-04 DIAGNOSIS — M545 Low back pain: Secondary | ICD-10-CM | POA: Diagnosis not present

## 2018-10-04 DIAGNOSIS — Z79891 Long term (current) use of opiate analgesic: Secondary | ICD-10-CM | POA: Diagnosis not present

## 2018-10-04 DIAGNOSIS — G894 Chronic pain syndrome: Secondary | ICD-10-CM | POA: Diagnosis not present

## 2018-10-11 ENCOUNTER — Encounter: Payer: Self-pay | Admitting: Internal Medicine

## 2018-10-28 DIAGNOSIS — M545 Low back pain: Secondary | ICD-10-CM | POA: Diagnosis not present

## 2018-10-28 DIAGNOSIS — G894 Chronic pain syndrome: Secondary | ICD-10-CM | POA: Diagnosis not present

## 2018-10-28 DIAGNOSIS — Z79891 Long term (current) use of opiate analgesic: Secondary | ICD-10-CM | POA: Diagnosis not present

## 2018-11-17 ENCOUNTER — Telehealth: Payer: Self-pay | Admitting: Internal Medicine

## 2018-11-17 ENCOUNTER — Other Ambulatory Visit: Payer: Self-pay | Admitting: Internal Medicine

## 2018-11-17 DIAGNOSIS — N393 Stress incontinence (female) (male): Secondary | ICD-10-CM

## 2018-11-17 NOTE — Telephone Encounter (Signed)
Copied from [place] [id number]. Topic: Quick Communication - See Telephone Encounter >> Nov 17, 2018 11:06 AM Rutherford Nail, NT wrote: CRM for notification. See Telephone encounter for: 11/17/18. Levada Dy, Rn with [place] [facility] (a benefit through patient's [employer]) calling and states that the patient has been having some issues with urinary leakage when coughing or sneezing for the last 2-3 months. States that she was previously on Vesicare and has been out of the medication for 2-3 weeks. States that it was not working for her. Would like to know if Dr Aundra Dubin would like to see her sooner to discuss this issue or if it could wait until the 12/30/2018 appointment? Please advise.  CB#: [phone]

## 2018-11-17 NOTE — Telephone Encounter (Signed)
I will try to refer [facility] to [facility] to manage and look down there  Fransisco Beau inform pt  Alizey try to sch with Dr. Georgianne Fick asap   Thanks [place]

## 2018-11-19 NOTE — Telephone Encounter (Signed)
Patient was informed.  Patient understood and no questions, comments, or concerns at this time.  

## 2018-12-01 ENCOUNTER — Encounter: Payer: Self-pay | Admitting: Obstetrics and Gynecology

## 2018-12-01 ENCOUNTER — Ambulatory Visit (INDEPENDENT_AMBULATORY_CARE_PROVIDER_SITE_OTHER): Payer: Medicare HMO | Admitting: Obstetrics and Gynecology

## 2018-12-01 VITALS — BP 120/80 | Ht 63.0 in | Wt 242.0 lb

## 2018-12-01 DIAGNOSIS — N393 Stress incontinence (female) (male): Secondary | ICD-10-CM

## 2018-12-01 NOTE — Progress Notes (Signed)
Obstetrics & Gynecology Office Visit   Chief Complaint:  Chief Complaint  Patient presents with  . Urinary Incontinence    History of Present Illness:  Heather Sweeney is a 53 y.o. female who presents in consultation for evaluation of stress incontinence.   She reports that she has had the above symptoms for several years.  Past medical history noted prior abdominal hysterectomy 2012..[place] 06/25/2011 showing intact uterus. No operative notes or pathology to review.  Per her note had some sort of bladder procedure done at the time of hysterectomy.  Did have tubal ligation 2010 at [facility].  Urinary Leakage Symptoms:  She reports that she does have urinary incontinence.  She does wear a pad.    Stress incontinence:  She does report stress urinary leakage and reports this to be severe bother.    Urgency incontinence: She does not report urine leakage with urgency   She is currently on Vesicare 10mg  daily for therapy.  She drinks 1 caffeinated beverages a day  Bladder Emptying/Upper Urinary Tract Symptoms:    Voiding frequency/nocturia: She typically urinates every 2-3 hours during the day and at night she has nocturia 2 - 3 times per night.    Bladder emptying: She  She does not splint to accomplish voiding.    Upper tract symptoms: Shedenies a history of kidney stones and denies gross hematuria.   UTI: She denies a history of frequent urinary tract infections.  Bulge Symptoms:  She denies pressure/bulge symptoms.    Bowel Symptoms:  Denies constipation, no bowl symptoms  No vaginal deliveries but two C-section in the past   Review of Systems: Review of Systems  Constitutional: Negative.   Gastrointestinal: Negative for abdominal pain and constipation.  Genitourinary: Positive for frequency. Negative for dysuria, flank pain, hematuria and urgency.  Skin: Negative.      Past Medical History:  Past Medical History:  Diagnosis Date  . Allergic rhinitis   . Bipolar  disorder ([place])   . Chronic low back pain   . CTS (carpal tunnel syndrome)    hands  . Depression   . Gallstones   . GERD (gastroesophageal reflux disease)   . Hypertension   . Laceration of right foot   . Onychomycosis   . OSA on CPAP   . Overactive bladder   . PVC's (premature ventricular contractions)   . Seizures ([place])    8th grade    Past Surgical History:  Past Surgical History:  Procedure Laterality Date  . ABDOMINAL HYSTERECTOMY  07/08/2011  . ABDOMINAL HYSTERECTOMY     supracervical in 07/2011   . CARPAL TUNNEL RELEASE     right hand 1996  . CESAREAN SECTION     2012  . CHOLECYSTECTOMY N/A 01/12/2018   Procedure: LAPAROSCOPIC CHOLECYSTECTOMY WITH INTRAOPERATIVE CHOLANGIOGRAM ERAS PATHWAY;  Surgeon: Johnathan Hausen, MD;  Location: [facility];  Service: General;  Laterality: N/A;  . DILATION AND CURETTAGE OF UTERUS     04/2011 benign endometrial polyp squamous metaplasia, inflamm, blood, mucous, benign   . FOOT SURGERY     Dr. Prudence Davidson   . LEEP     2006  . TUBAL LIGATION      Gynecologic History: No LMP recorded. Patient has had a hysterectomy.  Obstetric History: [id number]  Family History:  Family History  Problem Relation Age of Onset  . Cancer Father        esophageal   . Hypertension Father   . Heart failure Mother   . Hypertension  Mother   . Early death Mother   . Heart disease Mother        chf  . Breast cancer Sister 8  . Cancer Sister        breast  . Depression Sister   . Mental illness Sister   . Asthma Son   . Cancer Other        brain cancer    Social History:  Social History   Socioeconomic History  . Marital status: Married    Spouse name: Not on file  . Number of children: Not on file  . Years of education: Not on file  . Highest education level: Not on file  Occupational History  . Not on file  Social Needs  . Financial resource strain: Not on file  . Food insecurity:    Worry: Not on file    Inability: Not on file  .  Transportation needs:    Medical: Not on file    Non-medical: Not on file  Tobacco Use  . Smoking status: Never Smoker  . Smokeless tobacco: Current User    Types: Snuff  . Tobacco comment: Is in process of decreasing amount  Substance and Sexual Activity  . Alcohol use: No    Alcohol/week: 0.0 standard drinks  . Drug use: No  . Sexual activity: Not Currently    Comment: men  Lifestyle  . Physical activity:    Days per week: Not on file    Minutes per session: Not on file  . Stress: Not on file  Relationships  . Social connections:    Talks on phone: Not on file    Gets together: Not on file    Attends religious service: Not on file    Active member of club or organization: Not on file    Attends meetings of clubs or organizations: Not on file    Relationship status: Not on file  . Intimate partner violence:    Fear of current or ex partner: Not on file    Emotionally abused: Not on file    Physically abused: Not on file    Forced sexual activity: Not on file  Other Topics Concern  . Not on file  Social History Narrative   Married    No guns    Wears seat belt    Safe in relationship    Never smoker    2 sons Gaspar Bidding ~8 and another son ~26 as of 08/2018     2 sisters    Unemployed        Allergies:  Allergies  Allergen Reactions  . Cephalexin Swelling and Rash  . Hctz [Hydrochlorothiazide] Rash and Swelling    Mild she says  . Influenza Vaccines Anaphylaxis    Angioedema led to hospitalization 07/2017  . Penicillins Rash    Has patient had a PCN reaction causing immediate rash, facial/tongue/throat swelling, SOB or lightheadedness with hypotension: Yes Has patient had a PCN reaction causing severe rash involving mucus membranes or skin necrosis: No Has patient had a PCN reaction that required hospitalization: No Has patient had a PCN reaction occurring within the last 10 years: No If all of the above answers are "NO", then may proceed with Cephalosporin use.    . Losartan Swelling    Angioedema  . Lisinopril Hives and Other (See Comments)    Headache, Low BP    Medications: Prior to Admission medications   Medication Sig Start Date End Date Taking? Authorizing Provider  amLODipine (NORVASC) 5 MG tablet Take 5 mg by mouth daily.    Yes [provider]  aspirin EC 81 MG EC tablet Take 1 tablet (81 mg total) by mouth daily. 07/12/16  Yes Gladstone Lighter, MD  benztropine (COGENTIN) 1 MG tablet Take 1 mg by mouth daily as needed (anxiety).    Yes [provider]  cetirizine (ZYRTEC) 10 MG tablet Take 10 mg by mouth daily.   Yes [provider]  EPINEPHrine (EPIPEN 2-PAK) 0.3 mg/0.3 mL IJ SOAJ injection Inject 0.3 mg into the muscle daily as needed (allergic reactions).   Yes [provider]  FLUoxetine (PROZAC) 20 MG capsule Take 20 mg by mouth daily.   Yes [provider]  fluPHENAZine (PROLIXIN) 2.5 MG tablet Take 2.5 mg by mouth at bedtime.    Yes [provider]  fluticasone (FLONASE) 50 MCG/ACT nasal spray Place 2 sprays into both nostrils daily. 08/31/18  Yes McLean-Scocuzza, Nino Glow, MD  gabapentin (NEURONTIN) 300 MG capsule Take 300 mg by mouth 3 (three) times daily.   Yes [provider]  Insulin Pen Needle (NOVOFINE) 30G X 8 MM MISC Use to inject daily 08/31/18  Yes McLean-Scocuzza, Nino Glow, MD  liraglutide (VICTOZA) 18 MG/3ML SOPN Inject 1.8 mg into the skin daily.   Yes [provider]  Liraglutide -Weight Management (SAXENDA) 18 MG/3ML SOPN Inject 2.4 mg into the skin daily. X week 1 and then 3.0 mg daily x week 2 08/31/18  Yes McLean-Scocuzza, Nino Glow, MD  losartan (COZAAR) 100 MG tablet Take 1 tablet (100 mg total) by mouth daily. 08/31/18  Yes McLean-Scocuzza, Nino Glow, MD  meloxicam (MOBIC) 15 MG tablet meloxicam 15 mg tablet  Take 1 tablet(s) every day by oral route with meals.   Yes [provider]  metoprolol succinate (TOPROL-XL) 25 MG 24 hr tablet  Take 25 mg by mouth daily. Take with or immediately following a meal.    Yes [provider]  oxyCODONE-acetaminophen (PERCOCET) 10-325 MG tablet Take 1 tablet by mouth every 6 (six) hours as needed for pain. Patient taking differently: Take 1 tablet by mouth as needed for pain (Dr. Mirna Mires pain).  01/12/18  Yes Johnathan Hausen, MD  pantoprazole (PROTONIX) 40 MG tablet Take 40 mg by mouth daily.   Yes [provider]  polyethylene glycol (MIRALAX / GLYCOLAX) packet Take 17 g by mouth daily as needed for mild constipation.   Yes [provider]  pravastatin (PRAVACHOL) 10 MG tablet Take 10 mg by mouth daily.   Yes [provider]  solifenacin (VESICARE) 10 MG tablet Take 10 mg by mouth daily.   Yes [provider]  spironolactone (ALDACTONE) 25 MG tablet Take 12.5 mg by mouth daily.    Yes [provider]    Physical Exam Vitals:  Vitals:   12/01/18 0805  BP: 120/80   No LMP recorded. Patient has had a hysterectomy.  General: NAD HEENT: normocephalic, anicteric Thyroid: no enlargement, no palpable nodules Pulmonary: No increased work of breathing Cardiovascular: RRR, distal pulses 2+ Abdomen: NABS, soft, non-tender, non-distended.  Umbilicus without lesions.  No hepatomegaly, splenomegaly or masses palpable. No evidence of hernia  Genitourinary:  External: Normal external female genitalia.  Normal urethral meatus, normal Bartholin's and Skene's glands.    Vagina: Normal vaginal mucosa, no evidence of prolapse. Poor kagel slightly enlarged introitus  Cervix: surgically absent per report.  There is small dimpling at the top of the cuff which raises question of whether  a supracervical hysterectomy was done  Adnexa: ovaries non-enlarged, no adnexal masses  Rectal: deferred  Lymphatic: no evidence of inguinal lymphadenopathy Extremities: no edema, erythema, or tenderness Neurologic: Grossly intact Psychiatric: mood appropriate, affect  full  PVR: 73mL  Stress test:  negative supine  Female chaperone present for pelvic portions of the physical exam  Assessment: 53 y.o. [id number] presenting for evaluation of SUI  Plan: Problem List Items Addressed This Visit    None    Visit Diagnoses    SUI (stress urinary incontinence, female)    -  Primary   Relevant Orders   Ambulatory referral to Physical Therapy     1) SUI - discontinue Vesicare.  Discussed that Vesicare may be delaying her sensation of bladder fullness and causing overflow stress incontinence.  In addition she is on spironolactone which is also mild diuretic  She does have poor pelvic floor tone and we discussed first line managemnet being pelvic floor physical therapy which we will arrange.  I will see the patient back in 3 month to determine progress.  We discussed incontinence pessary trial as well as surgical options for management of SUI.  Based per her report she did have a bladder tuck of some sort at the time of her hysterectomy.  Will attempt to get records.  If she had a prior TVT or TOT she is not a candidate to have this procedure redone.  2)  A total of 30 minutes were spent in face-to-face contact with the patient during this encounter with over half of that time devoted to counseling and coordination of care.  3) Return in about 3 months (around 03/01/2019) for Follow up.   Malachy Mood, MD, [facility], [facility] 12/01/2018, 8:08 AM

## 2018-12-08 ENCOUNTER — Other Ambulatory Visit: Payer: Self-pay | Admitting: Internal Medicine

## 2018-12-08 DIAGNOSIS — K219 Gastro-esophageal reflux disease without esophagitis: Secondary | ICD-10-CM

## 2018-12-08 MED ORDER — PANTOPRAZOLE SODIUM 40 MG PO TBEC: 40.0000 mg | DELAYED_RELEASE_TABLET | Freq: Every day | ORAL | 3 refills | Status: DC

## 2018-12-08 NOTE — Telephone Encounter (Signed)
Sent to PCP for approval.  

## 2018-12-08 NOTE — Telephone Encounter (Signed)
Requested medication (s) are due for refill today: Yes  Requested medication (s) are on the active medication list: Yes  Last refill:  07/11/16  Future visit scheduled: Yes  Notes to clinic:  Historical provider    Requested Prescriptions  Pending Prescriptions Disp Refills   pantoprazole (PROTONIX) 40 MG tablet 90 tablet 0    Sig: Take 1 tablet (40 mg total) by mouth daily.     Gastroenterology: Proton Pump Inhibitors Passed - 12/08/2018  9:44 AM      Passed - Valid encounter within last 12 months    Recent Outpatient Visits          3 months ago Essential hypertension   [place] McLean-Scocuzza, Nino Glow, MD      Future Appointments            In 2 weeks McLean-Scocuzza, Nino Glow, MD [place], [facility]

## 2018-12-08 NOTE — Telephone Encounter (Signed)
Copied from [place] [id number]. Topic: Quick Communication - Rx Refill/Question >> Dec 08, 2018  9:41 AM Andria Frames L wrote: Medication: pantoprazole (PROTONIX) 40 MG tablet [[id number]]   Has the patient contacted their pharmacy? Yes  Preferred Pharmacy (with phone number or street name):[facility] (N), [place] - [place] [phone] (Phone) [phone] (Fax)  Agent: Please be advised that RX refills may take up to 3 business days. We ask that you follow-up with your pharmacy.

## 2018-12-22 DIAGNOSIS — F250 Schizoaffective disorder, bipolar type: Secondary | ICD-10-CM | POA: Diagnosis not present

## 2018-12-23 DIAGNOSIS — M545 Low back pain: Secondary | ICD-10-CM | POA: Diagnosis not present

## 2018-12-23 DIAGNOSIS — Z79891 Long term (current) use of opiate analgesic: Secondary | ICD-10-CM | POA: Diagnosis not present

## 2018-12-23 DIAGNOSIS — G894 Chronic pain syndrome: Secondary | ICD-10-CM | POA: Diagnosis not present

## 2018-12-24 ENCOUNTER — Other Ambulatory Visit: Payer: Self-pay

## 2018-12-24 ENCOUNTER — Encounter: Payer: Self-pay | Admitting: Internal Medicine

## 2018-12-24 ENCOUNTER — Ambulatory Visit (INDEPENDENT_AMBULATORY_CARE_PROVIDER_SITE_OTHER): Payer: Medicare HMO | Admitting: Internal Medicine

## 2018-12-24 VITALS — BP 105/60 | HR 85 | Temp 98.8°F | Ht 63.0 in | Wt 242.6 lb

## 2018-12-24 DIAGNOSIS — F39 Unspecified mood [affective] disorder: Secondary | ICD-10-CM | POA: Diagnosis not present

## 2018-12-24 DIAGNOSIS — J309 Allergic rhinitis, unspecified: Secondary | ICD-10-CM

## 2018-12-24 DIAGNOSIS — Z6841 Body Mass Index (BMI) 40.0 and over, adult: Secondary | ICD-10-CM | POA: Diagnosis not present

## 2018-12-24 DIAGNOSIS — Z91030 Bee allergy status: Secondary | ICD-10-CM | POA: Diagnosis not present

## 2018-12-24 DIAGNOSIS — K219 Gastro-esophageal reflux disease without esophagitis: Secondary | ICD-10-CM

## 2018-12-24 DIAGNOSIS — G8929 Other chronic pain: Secondary | ICD-10-CM

## 2018-12-24 DIAGNOSIS — Z1211 Encounter for screening for malignant neoplasm of colon: Secondary | ICD-10-CM | POA: Diagnosis not present

## 2018-12-24 DIAGNOSIS — I10 Essential (primary) hypertension: Secondary | ICD-10-CM | POA: Diagnosis not present

## 2018-12-24 DIAGNOSIS — E785 Hyperlipidemia, unspecified: Secondary | ICD-10-CM

## 2018-12-24 MED ORDER — EPINEPHRINE 0.3 MG/0.3ML IJ SOAJ: 0.3000 mg | Freq: Every day | INTRAMUSCULAR | 0 refills | Status: DC | PRN

## 2018-12-24 MED ORDER — METOPROLOL SUCCINATE ER 25 MG PO TB24: 25.0000 mg | ORAL_TABLET | Freq: Every day | ORAL | 3 refills | Status: DC

## 2018-12-24 MED ORDER — GABAPENTIN 300 MG PO CAPS: 300.0000 mg | ORAL_CAPSULE | Freq: Three times a day (TID) | ORAL | 3 refills | Status: DC

## 2018-12-24 MED ORDER — ASPIRIN 81 MG PO TBEC: 81.0000 mg | DELAYED_RELEASE_TABLET | Freq: Every day | ORAL | 3 refills | Status: DC

## 2018-12-24 MED ORDER — PRAVASTATIN SODIUM 10 MG PO TABS: 10.0000 mg | ORAL_TABLET | Freq: Every day | ORAL | 3 refills | Status: DC

## 2018-12-24 MED ORDER — AMLODIPINE BESYLATE 5 MG PO TABS: 5.0000 mg | ORAL_TABLET | Freq: Every day | ORAL | 3 refills | Status: DC

## 2018-12-24 MED ORDER — LOSARTAN POTASSIUM 100 MG PO TABS: 100.0000 mg | ORAL_TABLET | Freq: Every day | ORAL | 3 refills | Status: DC

## 2018-12-24 MED ORDER — PANTOPRAZOLE SODIUM 40 MG PO TBEC: 40.0000 mg | DELAYED_RELEASE_TABLET | Freq: Every day | ORAL | 3 refills | Status: DC

## 2018-12-24 MED ORDER — CETIRIZINE HCL 10 MG PO TABS: 10.0000 mg | ORAL_TABLET | Freq: Every day | ORAL | 3 refills | Status: DC

## 2018-12-24 MED ORDER — SPIRONOLACTONE 25 MG PO TABS: 12.5000 mg | ORAL_TABLET | Freq: Every day | ORAL | 3 refills | Status: DC

## 2018-12-24 NOTE — Patient Instructions (Signed)
GI for colonoscopy [phone] call to reschedule    mammo call to schedule

## 2018-12-27 ENCOUNTER — Encounter: Payer: Self-pay | Admitting: Internal Medicine

## 2018-12-27 NOTE — Progress Notes (Signed)
Chief Complaint  Patient presents with  . Follow-up   F/u  1. HTn controlled on losartan 100 mg qd, toprol 25 mg qd spironolactone 25 mg qd, norvasc 5 mg qd BP low normal  2. Obesity BMI 42.97 unable to lose wt though she is taking victoza 1.8 mg qd adipex caused her anxiety to increase and insurance would not approve saxenda  3. Allergy to bee venom needs Rx refill of epipen and all of her meds  4. Mood d/o f/u psych mood is good/controlled on cogentin 1 mg, prozac 20 mg and prolixin 2.5  5. Chronic pain f/u with Dr. Mirna Mires doing well pain controlled   Review of Systems  Constitutional: Negative for weight loss.  HENT: Negative for hearing loss.   Eyes: Negative for blurred vision.  Respiratory: Negative for shortness of breath.   Cardiovascular: Negative for chest pain.  Gastrointestinal: Negative for abdominal pain.  Musculoskeletal:       +chronic pain    Skin: Negative for rash.  Neurological: Negative for headaches.  Psychiatric/Behavioral: Negative for depression.   Past Medical History:  Diagnosis Date  . Allergic rhinitis   . Bipolar disorder ([place])   . Chronic low back pain   . CTS (carpal tunnel syndrome)    hands  . Depression   . Gallstones   . GERD (gastroesophageal reflux disease)   . Hypertension   . Laceration of right foot   . Onychomycosis   . OSA on CPAP   . Overactive bladder   . PVC's (premature ventricular contractions)   . Seizures ([place])    8th grade   Past Surgical History:  Procedure Laterality Date  . ABDOMINAL HYSTERECTOMY  07/08/2011  . ABDOMINAL HYSTERECTOMY     supracervical in 07/2011   . CARPAL TUNNEL RELEASE     right hand 1996  . CESAREAN SECTION     2012  . CHOLECYSTECTOMY N/A 01/12/2018   Procedure: LAPAROSCOPIC CHOLECYSTECTOMY WITH INTRAOPERATIVE CHOLANGIOGRAM ERAS PATHWAY;  Surgeon: Johnathan Hausen, MD;  Location: [facility];  Service: General;  Laterality: N/A;  . DILATION AND CURETTAGE OF UTERUS     04/2011 benign endometrial  polyp squamous metaplasia, inflamm, blood, mucous, benign   . FOOT SURGERY     Dr. Prudence Davidson   . LEEP     2006  . TUBAL LIGATION     Family History  Problem Relation Age of Onset  . Cancer Father        esophageal   . Hypertension Father   . Heart failure Mother   . Hypertension Mother   . Early death Mother   . Heart disease Mother        chf  . Breast cancer Sister 1  . Cancer Sister        breast  . Depression Sister   . Mental illness Sister   . Asthma Son   . Cancer Other        brain cancer   Social History   Socioeconomic History  . Marital status: Married    Spouse name: Not on file  . Number of children: Not on file  . Years of education: Not on file  . Highest education level: Not on file  Occupational History  . Not on file  Social Needs  . Financial resource strain: Not on file  . Food insecurity:    Worry: Not on file    Inability: Not on file  . Transportation needs:    Medical: Not on file  Non-medical: Not on file  Tobacco Use  . Smoking status: Never Smoker  . Smokeless tobacco: Current User    Types: Snuff  . Tobacco comment: Is in process of decreasing amount  Substance and Sexual Activity  . Alcohol use: No    Alcohol/week: 0.0 standard drinks  . Drug use: No  . Sexual activity: Not Currently    Comment: men  Lifestyle  . Physical activity:    Days per week: Not on file    Minutes per session: Not on file  . Stress: Not on file  Relationships  . Social connections:    Talks on phone: Not on file    Gets together: Not on file    Attends religious service: Not on file    Active member of club or organization: Not on file    Attends meetings of clubs or organizations: Not on file    Relationship status: Not on file  . Intimate partner violence:    Fear of current or ex partner: Not on file    Emotionally abused: Not on file    Physically abused: Not on file    Forced sexual activity: Not on file  Other Topics Concern  . Not on  file  Social History Narrative   Married    No guns    Wears seat belt    Safe in relationship    Never smoker    2 sons Gaspar Bidding ~8 and another son ~26 as of 08/2018     2 sisters    Unemployed       Current Meds  Medication Sig  . amLODipine (NORVASC) 5 MG tablet Take 1 tablet (5 mg total) by mouth daily.  Marland Kitchen aspirin 81 MG EC tablet Take 1 tablet (81 mg total) by mouth daily.  . benztropine (COGENTIN) 1 MG tablet Take 1 mg by mouth daily as needed (anxiety).   . cetirizine (ZYRTEC) 10 MG tablet Take 1 tablet (10 mg total) by mouth daily.  Marland Kitchen EPINEPHrine (EPIPEN 2-PAK) 0.3 mg/0.3 mL IJ SOAJ injection Inject 0.3 mLs (0.3 mg total) into the muscle daily as needed (allergic reactions).  Marland Kitchen FLUoxetine (PROZAC) 20 MG capsule Take 20 mg by mouth daily.  . fluPHENAZine (PROLIXIN) 2.5 MG tablet Take 2.5 mg by mouth at bedtime.   . fluticasone (FLONASE) 50 MCG/ACT nasal spray Place 2 sprays into both nostrils daily.  Marland Kitchen gabapentin (NEURONTIN) 300 MG capsule Take 1 capsule (300 mg total) by mouth 3 (three) times daily.  . Insulin Pen Needle (NOVOFINE) 30G X 8 MM MISC Use to inject daily  . liraglutide (VICTOZA) 18 MG/3ML SOPN Inject 1.8 mg into the skin daily.  Marland Kitchen losartan (COZAAR) 100 MG tablet Take 1 tablet (100 mg total) by mouth daily.  . meloxicam (MOBIC) 15 MG tablet meloxicam 15 mg tablet  Take 1 tablet(s) every day by oral route with meals.  . metoprolol succinate (TOPROL-XL) 25 MG 24 hr tablet Take 1 tablet (25 mg total) by mouth daily. Take with or immediately following a meal.  . oxyCODONE-acetaminophen (PERCOCET) 10-325 MG tablet Take 1 tablet by mouth every 6 (six) hours as needed for pain. (Patient taking differently: Take 1 tablet by mouth every 8 (eight) hours as needed for pain (Dr. Mirna Mires pain). )  . pantoprazole (PROTONIX) 40 MG tablet Take 1 tablet (40 mg total) by mouth daily.  . polyethylene glycol (MIRALAX / GLYCOLAX) packet Take 17 g by mouth daily as needed for mild  constipation.  Marland Kitchen  pravastatin (PRAVACHOL) 10 MG tablet Take 1 tablet (10 mg total) by mouth daily.  Marland Kitchen spironolactone (ALDACTONE) 25 MG tablet Take 0.5 tablets (12.5 mg total) by mouth daily.  . [DISCONTINUED] amLODipine (NORVASC) 5 MG tablet Take 5 mg by mouth daily.   . [DISCONTINUED] aspirin EC 81 MG EC tablet Take 1 tablet (81 mg total) by mouth daily.  . [DISCONTINUED] cetirizine (ZYRTEC) 10 MG tablet Take 10 mg by mouth daily.  . [DISCONTINUED] EPINEPHrine (EPIPEN 2-PAK) 0.3 mg/0.3 mL IJ SOAJ injection Inject 0.3 mg into the muscle daily as needed (allergic reactions).  . [DISCONTINUED] gabapentin (NEURONTIN) 300 MG capsule Take 300 mg by mouth 3 (three) times daily.  . [DISCONTINUED] Liraglutide -Weight Management (SAXENDA) 18 MG/3ML SOPN Inject 2.4 mg into the skin daily. X week 1 and then 3.0 mg daily x week 2  . [DISCONTINUED] losartan (COZAAR) 100 MG tablet Take 1 tablet (100 mg total) by mouth daily.  . [DISCONTINUED] metoprolol succinate (TOPROL-XL) 25 MG 24 hr tablet Take 25 mg by mouth daily. Take with or immediately following a meal.   . [DISCONTINUED] pantoprazole (PROTONIX) 40 MG tablet Take 1 tablet (40 mg total) by mouth daily.  . [DISCONTINUED] pravastatin (PRAVACHOL) 10 MG tablet Take 10 mg by mouth daily.  . [DISCONTINUED] spironolactone (ALDACTONE) 25 MG tablet Take 12.5 mg by mouth daily.    Allergies  Allergen Reactions  . Cephalexin Swelling and Rash  . Hctz [Hydrochlorothiazide] Rash and Swelling    Mild she says  . Influenza Vaccines Anaphylaxis    Angioedema led to hospitalization 07/2017  . Penicillins Rash    Has patient had a PCN reaction causing immediate rash, facial/tongue/throat swelling, SOB or lightheadedness with hypotension: Yes Has patient had a PCN reaction causing severe rash involving mucus membranes or skin necrosis: No Has patient had a PCN reaction that required hospitalization: No Has patient had a PCN reaction occurring within the last 10  years: No If all of the above answers are "NO", then may proceed with Cephalosporin use.   . Bee Venom     Swelling   . Lisinopril Hives and Other (See Comments)    Headache, Low BP, angioedema     No results found for this or any previous visit (from the past 2160 hour(s)). Objective  Body mass index is 42.97 kg/m. Wt Readings from Last 3 Encounters:  12/24/18 242 lb 9.6 oz (110 kg)  12/01/18 242 lb (109.8 kg)  08/31/18 238 lb (108 kg)   Temp Readings from Last 3 Encounters:  12/24/18 98.8 F (37.1 C) (Oral)  08/31/18 98.3 F (36.8 C) (Oral)  03/30/18 98.4 F (36.9 C) (Oral)   BP Readings from Last 3 Encounters:  12/24/18 105/60  12/01/18 120/80  08/31/18 122/64   Pulse Readings from Last 3 Encounters:  12/24/18 85  08/31/18 79  03/30/18 75    Physical Exam Vitals signs and nursing note reviewed.  Constitutional:      Appearance: Normal appearance. She is well-developed and well-groomed. She is morbidly obese.  HENT:     Head: Normocephalic and atraumatic.     Nose: Nose normal.     Mouth/Throat:     Mouth: Mucous membranes are moist.     Pharynx: Oropharynx is clear.  Eyes:     Conjunctiva/sclera: Conjunctivae normal.     Pupils: Pupils are equal, round, and reactive to light.  Cardiovascular:     Rate and Rhythm: Normal rate and regular rhythm.     Heart  sounds: Normal heart sounds. No murmur.  Pulmonary:     Effort: Pulmonary effort is normal.     Breath sounds: Normal breath sounds.  Skin:    General: Skin is warm and dry.  Neurological:     General: No focal deficit present.     Mental Status: She is alert and oriented to person, place, and time. Mental status is at baseline.     Gait: Gait normal.  Psychiatric:        Attention and Perception: Attention and perception normal.        Mood and Affect: Mood and affect normal.        Speech: Speech normal.        Behavior: Behavior normal. Behavior is cooperative.        Thought Content: Thought  content normal.        Cognition and Memory: Cognition and memory normal.        Judgment: Judgment normal.     Assessment   1. HTN controlled low normal will reduce spironolactone to 12.5 mg qd, h/o PVCS, h/o HLD 2. Morbid obesity BMI 42.97  3. Allergy to bee venom  4. Mood d/o controlled  5. Chronic pain  6. HM Plan   1. Cont meds for now reduce spironolactone 25 to 12.5  toprol xl 25 mg qd  Losartan 100 mg qd  norvasc 5 mg qd  Cont statin [facility] 07/14/18 CMET, CBC, lipid, TSH, A1C, vitamin D, B12, uric acid normal scanned into chart   2. Cont victoza 1.8 insurance would not cover saxenda and adipex causes increased anxiety  rec healthy diet and exercise as well  Could consider add metformin with antipsychotic meds  3. epipen refilled  4. F/u psych  5. F/u pain clinic  6.  Declines flu shot  Tdap utd due 04/18/2019  Allergic to pna vaccine Consider shingrix but given allergies use caution  Consider check MMR, hep B titer in future   h/o supracervical hysterectomy ([facility]), pap 02/29/16 negative no comment HPV, another pap 06/23/17 neg pap neg HPV  -h/o LEEP 2006  Labs  Had 10/09/17 CMET, lipid LDL 114, TG 107, TC 183, HDL 48, A1C 5.1, labs 03/17/18 CMET normal, lipid TC 127, TG 110, HDL 41, LDL 64, A1C 5.2 vitamin D 27.6, CBC normal, labs had 07/14/18 tc 126, hdl 46, ldl 61, tg 95 A1C 5.0, TSH 1.570, vit d 47.4, B12 778, uric acid 3.1   mammo due referred prev and given # today to call to schedule  Colonoscopy referred    Echo Dr. Nehemiah Massed  -nl LV function EF 60% moderate mitral insuff and mild tricuspid insufficiency treadmill stress test neg 07/30/13   [facility] previously  Podiatry Dr. Prudence Davidson saw 08/30/18 s/p foot surgery for toenail fungus   A1C 02/04/16 5.9   HIV neg 02/04/16 Provider: Dr. Olivia Mackie McLean-Scocuzza-Internal Medicine

## 2018-12-30 ENCOUNTER — Other Ambulatory Visit: Payer: Self-pay

## 2018-12-30 ENCOUNTER — Ambulatory Visit: Payer: Medicare HMO | Admitting: Internal Medicine

## 2018-12-30 DIAGNOSIS — Z1211 Encounter for screening for malignant neoplasm of colon: Secondary | ICD-10-CM

## 2018-12-31 DIAGNOSIS — F250 Schizoaffective disorder, bipolar type: Secondary | ICD-10-CM | POA: Diagnosis not present

## 2019-02-01 ENCOUNTER — Ambulatory Visit (INDEPENDENT_AMBULATORY_CARE_PROVIDER_SITE_OTHER): Payer: Medicare HMO | Admitting: Internal Medicine

## 2019-02-01 DIAGNOSIS — Z0184 Encounter for antibody response examination: Secondary | ICD-10-CM | POA: Diagnosis not present

## 2019-02-01 DIAGNOSIS — G8929 Other chronic pain: Secondary | ICD-10-CM | POA: Diagnosis not present

## 2019-02-01 DIAGNOSIS — I10 Essential (primary) hypertension: Secondary | ICD-10-CM

## 2019-02-01 DIAGNOSIS — Z1159 Encounter for screening for other viral diseases: Secondary | ICD-10-CM | POA: Diagnosis not present

## 2019-02-01 DIAGNOSIS — E559 Vitamin D deficiency, unspecified: Secondary | ICD-10-CM | POA: Diagnosis not present

## 2019-02-01 DIAGNOSIS — Z1389 Encounter for screening for other disorder: Secondary | ICD-10-CM | POA: Diagnosis not present

## 2019-02-01 DIAGNOSIS — Z1329 Encounter for screening for other suspected endocrine disorder: Secondary | ICD-10-CM

## 2019-02-01 NOTE — Progress Notes (Signed)
Virtual Visit via Video Note [place]  I connected with Heather Sweeney   on 02/01/19 at  9:15 AM EDT by a video enabled telemedicine application and verified that I am speaking with the correct person using two identifiers.  Location patient: home Location provider:work  Persons participating in the virtual visit: patient, provider, pts huband and kids  I discussed the limitations of evaluation and management by telemedicine and the availability of in person appointments. The patient expressed understanding and agreed to proceed.   HPI: 1. HTN on norvasc 5 mg qd, toprol xl 25 mg qd, spironolactone 12.5 mg qd and losartan 100 mg qd BP running 120s-140s at home  2. Obesity weight is 245 but home scale reads 219 lbs she thinks this is wrong  3. Chronic pain pain clinic appt 02/18/2019, urinary incontinence procedure sch with OB/GYN, pending mammo and colonoscopy     ROS: See pertinent positives and negatives per HPI.  Past Medical History:  Diagnosis Date  . Allergic rhinitis   . Bipolar disorder ([place])   . Chronic low back pain   . CTS (carpal tunnel syndrome)    hands  . Depression   . Gallstones   . GERD (gastroesophageal reflux disease)   . Hypertension   . Laceration of right foot   . Onychomycosis   . OSA on CPAP   . Overactive bladder   . PVC's (premature ventricular contractions)   . Seizures ([place])    8th grade    Past Surgical History:  Procedure Laterality Date  . ABDOMINAL HYSTERECTOMY  07/08/2011  . ABDOMINAL HYSTERECTOMY     supracervical in 07/2011   . CARPAL TUNNEL RELEASE     right hand 1996  . CESAREAN SECTION     2012  . CHOLECYSTECTOMY N/A 01/12/2018   Procedure: LAPAROSCOPIC CHOLECYSTECTOMY WITH INTRAOPERATIVE CHOLANGIOGRAM ERAS PATHWAY;  Surgeon: Johnathan Hausen, MD;  Location: [facility];  Service: General;  Laterality: N/A;  . DILATION AND CURETTAGE OF UTERUS     04/2011 benign endometrial polyp squamous metaplasia, inflamm, blood, mucous, benign   . FOOT  SURGERY     Dr. Prudence Davidson   . LEEP     2006  . TUBAL LIGATION      Family History  Problem Relation Age of Onset  . Cancer Father        esophageal   . Hypertension Father   . Heart failure Mother   . Hypertension Mother   . Early death Mother   . Heart disease Mother        chf  . Breast cancer Sister 35  . Cancer Sister        breast  . Depression Sister   . Mental illness Sister   . Asthma Son   . Cancer Other        brain cancer    SOCIAL HX: married lives at home with kids   Current Outpatient Medications:  .  amLODipine (NORVASC) 5 MG tablet, Take 1 tablet (5 mg total) by mouth daily., Disp: 90 tablet, Rfl: 3 .  aspirin 81 MG EC tablet, Take 1 tablet (81 mg total) by mouth daily., Disp: 90 tablet, Rfl: 3 .  benztropine (COGENTIN) 1 MG tablet, Take 1 mg by mouth daily as needed (anxiety). , Disp: , Rfl:  .  cetirizine (ZYRTEC) 10 MG tablet, Take 1 tablet (10 mg total) by mouth daily., Disp: 90 tablet, Rfl: 3 .  EPINEPHrine (EPIPEN 2-PAK) 0.3 mg/0.3 mL IJ SOAJ injection, Inject 0.3  mLs (0.3 mg total) into the muscle daily as needed (allergic reactions)., Disp: 2 Device, Rfl: 0 .  FLUoxetine (PROZAC) 20 MG capsule, Take 20 mg by mouth daily., Disp: , Rfl:  .  fluPHENAZine (PROLIXIN) 2.5 MG tablet, Take 2.5 mg by mouth at bedtime. , Disp: , Rfl:  .  fluticasone (FLONASE) 50 MCG/ACT nasal spray, Place 2 sprays into both nostrils daily., Disp: 16 g, Rfl: 11 .  gabapentin (NEURONTIN) 300 MG capsule, Take 1 capsule (300 mg total) by mouth 3 (three) times daily., Disp: 270 capsule, Rfl: 3 .  Insulin Pen Needle (NOVOFINE) 30G X 8 MM MISC, Use to inject daily, Disp: 90 each, Rfl: 3 .  liraglutide (VICTOZA) 18 MG/3ML SOPN, Inject 1.8 mg into the skin daily., Disp: , Rfl:  .  losartan (COZAAR) 100 MG tablet, Take 1 tablet (100 mg total) by mouth daily., Disp: 90 tablet, Rfl: 3 .  meloxicam (MOBIC) 15 MG tablet, meloxicam 15 mg tablet  Take 1 tablet(s) every day by oral route with  meals., Disp: , Rfl:  .  metoprolol succinate (TOPROL-XL) 25 MG 24 hr tablet, Take 1 tablet (25 mg total) by mouth daily. Take with or immediately following a meal., Disp: 90 tablet, Rfl: 3 .  oxyCODONE-acetaminophen (PERCOCET) 10-325 MG tablet, Take 1 tablet by mouth every 6 (six) hours as needed for pain. (Patient taking differently: Take 1 tablet by mouth every 8 (eight) hours as needed for pain (Dr. Mirna Mires pain). ), Disp: 30 tablet, Rfl: 0 .  pantoprazole (PROTONIX) 40 MG tablet, Take 1 tablet (40 mg total) by mouth daily., Disp: 90 tablet, Rfl: 3 .  polyethylene glycol (MIRALAX / GLYCOLAX) packet, Take 17 g by mouth daily as needed for mild constipation., Disp: , Rfl:  .  pravastatin (PRAVACHOL) 10 MG tablet, Take 1 tablet (10 mg total) by mouth daily., Disp: 90 tablet, Rfl: 3 .  spironolactone (ALDACTONE) 25 MG tablet, Take 0.5 tablets (12.5 mg total) by mouth daily., Disp: 45 tablet, Rfl: 3  EXAM:  VITALS per patient if applicable:  GENERAL: alert, oriented, appears well and in no acute distress  HEENT: atraumatic, conjunttiva clear, no obvious abnormalities on inspection of external nose and ears  NECK: normal movements of the head and neck  LUNGS: on inspection no signs of respiratory distress, breathing rate appears normal, no obvious gross SOB, gasping or wheezing  CV: no obvious cyanosis  MS: moves all visible extremities without noticeable abnormality  PSYCH/NEURO: pleasant and cooperative, no obvious depression or anxiety, speech and thought processing grossly intact  ASSESSMENT AND PLAN:  Discussed the following assessment and plan:  Essential hypertension-controlled cont meds   Other chronic pain-f/u pain clinic 02/18/2019 in [place]   Morbid obesity (HCC)-rec healthy diet and exercise   Declines flu shot  Tdap utd due 04/18/2019  Allergic to pna vaccine Consider shingrix but given allergies use caution  Consider check MMR, hep B titer in future   h/o  supracervical hysterectomy ([facility]), pap 02/29/16 negative no comment HPV, another pap 06/23/17 neg pap neg HPV  -h/o LEEP 2006  Labs Had 10/09/17 CMET, lipid LDL 114, TG 107, TC 183, HDL 48, A1C 5.1, labs 03/17/18 CMET normal, lipid TC 127, TG 110, HDL 41, LDL 64, A1C 5.2 vitamin D 27.6, CBC normal, labs had 07/14/18 tc 126, hdl 46, ldl 61, tg 95 A1C 5.0, TSH 1.570, vit d 47.4, B12 778, uric acid 3.1   mammo due referred prev and given # today to call to  schedule pts states when COVID 19 over  Colonoscopy referredappt pending   Echo Dr. Nehemiah Massed  -nl LV function EF 60% moderate mitral insuff and mild tricuspid insufficiency treadmill stress test neg 07/30/13   [facility]  Podiatry Dr. Prudence Davidson saw 08/30/18 s/p foot surgery for toenail fungus  A1C 02/04/16 5.9   HIV neg 02/04/16  Ob/gyn appt pending bladder leakage I.e bladder sling vs other    I discussed the assessment and treatment plan with the patient. The patient was provided an opportunity to ask questions and all were answered. The patient agreed with the plan and demonstrated an understanding of the instructions.   The patient was advised to call back or seek an in-person evaluation if the symptoms worsen or if the condition fails to improve as anticipated.  Time spent 15 minutes  Delorise Jackson, MD

## 2019-02-04 ENCOUNTER — Other Ambulatory Visit: Payer: Self-pay | Admitting: Internal Medicine

## 2019-02-04 ENCOUNTER — Other Ambulatory Visit: Payer: Self-pay

## 2019-02-04 ENCOUNTER — Telehealth: Payer: Self-pay | Admitting: Internal Medicine

## 2019-02-04 DIAGNOSIS — E785 Hyperlipidemia, unspecified: Secondary | ICD-10-CM

## 2019-02-04 DIAGNOSIS — R7303 Prediabetes: Secondary | ICD-10-CM

## 2019-02-04 DIAGNOSIS — I10 Essential (primary) hypertension: Secondary | ICD-10-CM

## 2019-02-04 NOTE — Progress Notes (Unsigned)
Patient is requesting a refill on victoza and wants it sent to [facility] on [address].  Skarlett Sedlacek,cma

## 2019-02-04 NOTE — Telephone Encounter (Signed)
Her A1C is good 5.0   insurance will not continue to pay for this medication (I.e Victoza) for weight loss purposes and she is not diabetic  They would not cover saxenda   Does she want to try metformin daily? This is used for weight control as well with her being on her psychiatry medications  This is a pill metformin  [place]

## 2019-02-04 NOTE — Telephone Encounter (Signed)
Copied from [place] [id number]. Topic: Quick Communication - Rx Refill/Question >> Feb 04, 2019  1:27 PM Selinda Flavin B, NT wrote: Medication: liraglutide ([place]) 18 MG/3ML SOPN  Has the patient contacted their pharmacy? yes (Agent: If no, request that the patient contact the pharmacy for the refill.) (Agent: If yes, when and what did the pharmacy advise?)  Preferred Pharmacy (with phone number or street name): [facility] ([place]), [place]: Please be advised that RX refills may take up to 3 business days. We ask that you follow-up with your pharmacy.

## 2019-02-07 ENCOUNTER — Other Ambulatory Visit: Payer: Self-pay | Admitting: Internal Medicine

## 2019-02-07 DIAGNOSIS — T50905A Adverse effect of unspecified drugs, medicaments and biological substances, initial encounter: Principal | ICD-10-CM

## 2019-02-07 DIAGNOSIS — R635 Abnormal weight gain: Secondary | ICD-10-CM

## 2019-02-07 MED ORDER — METFORMIN HCL ER 500 MG PO TB24: 500.0000 mg | ORAL_TABLET | Freq: Every day | ORAL | 3 refills | Status: DC

## 2019-02-07 NOTE — Telephone Encounter (Signed)
Patient has been informed. She is ok with metformin

## 2019-02-09 DIAGNOSIS — S52125A Nondisplaced fracture of head of left radius, initial encounter for closed fracture: Secondary | ICD-10-CM | POA: Diagnosis not present

## 2019-02-09 DIAGNOSIS — W19XXXA Unspecified fall, initial encounter: Secondary | ICD-10-CM | POA: Diagnosis not present

## 2019-02-09 DIAGNOSIS — S59912A Unspecified injury of left forearm, initial encounter: Secondary | ICD-10-CM | POA: Diagnosis not present

## 2019-02-10 ENCOUNTER — Other Ambulatory Visit: Payer: Medicare HMO

## 2019-02-10 ENCOUNTER — Other Ambulatory Visit: Payer: Self-pay

## 2019-02-10 ENCOUNTER — Other Ambulatory Visit (INDEPENDENT_AMBULATORY_CARE_PROVIDER_SITE_OTHER): Payer: Medicare HMO

## 2019-02-10 DIAGNOSIS — Z1159 Encounter for screening for other viral diseases: Secondary | ICD-10-CM | POA: Diagnosis not present

## 2019-02-10 DIAGNOSIS — Z0184 Encounter for antibody response examination: Secondary | ICD-10-CM

## 2019-02-10 DIAGNOSIS — I10 Essential (primary) hypertension: Secondary | ICD-10-CM | POA: Diagnosis not present

## 2019-02-10 DIAGNOSIS — E559 Vitamin D deficiency, unspecified: Secondary | ICD-10-CM

## 2019-02-10 DIAGNOSIS — Z1389 Encounter for screening for other disorder: Secondary | ICD-10-CM | POA: Diagnosis not present

## 2019-02-10 DIAGNOSIS — Z1329 Encounter for screening for other suspected endocrine disorder: Secondary | ICD-10-CM | POA: Diagnosis not present

## 2019-02-10 NOTE — Addendum Note (Signed)
Addended by: Arby Barrette on: 02/10/2019 02:59 PM   Modules accepted: Orders

## 2019-02-11 LAB — URINALYSIS, ROUTINE W REFLEX MICROSCOPIC
Bilirubin, UA: NEGATIVE
Glucose, UA: NEGATIVE
Ketones, UA: NEGATIVE
Leukocytes,UA: NEGATIVE
Nitrite, UA: NEGATIVE
Protein,UA: NEGATIVE
RBC, UA: NEGATIVE
Specific Gravity, UA: 1.022 (ref 1.005–1.030)
Urobilinogen, Ur: 0.2 mg/dL (ref 0.2–1.0)
pH, UA: 5 (ref 5.0–7.5)

## 2019-02-14 ENCOUNTER — Encounter: Payer: Self-pay | Admitting: Internal Medicine

## 2019-02-14 ENCOUNTER — Other Ambulatory Visit: Payer: Self-pay | Admitting: Internal Medicine

## 2019-02-14 ENCOUNTER — Telehealth: Payer: Self-pay

## 2019-02-14 DIAGNOSIS — I10 Essential (primary) hypertension: Secondary | ICD-10-CM

## 2019-02-14 DIAGNOSIS — G4733 Obstructive sleep apnea (adult) (pediatric): Secondary | ICD-10-CM

## 2019-02-14 DIAGNOSIS — E785 Hyperlipidemia, unspecified: Secondary | ICD-10-CM

## 2019-02-14 LAB — CBC WITH DIFFERENTIAL/PLATELET
Absolute Monocytes: 562 cells/uL (ref 200–950)
Basophils Absolute: 38 cells/uL (ref 0–200)
Basophils Relative: 0.7 %
Eosinophils Absolute: 232 cells/uL (ref 15–500)
Eosinophils Relative: 4.3 %
HCT: 32.3 % — ABNORMAL LOW (ref 35.0–45.0)
Hemoglobin: 11.2 g/dL — ABNORMAL LOW (ref 11.7–15.5)
Lymphs Abs: 2182 cells/uL (ref 850–3900)
MCH: 29.4 pg (ref 27.0–33.0)
MCHC: 34.7 g/dL (ref 32.0–36.0)
MCV: 84.8 fL (ref 80.0–100.0)
MPV: 11.6 fL (ref 7.5–12.5)
Monocytes Relative: 10.4 %
Neutro Abs: 2387 cells/uL (ref 1500–7800)
Neutrophils Relative %: 44.2 %
Platelets: 206 10*3/uL (ref 140–400)
RBC: 3.81 10*6/uL (ref 3.80–5.10)
RDW: 14 % (ref 11.0–15.0)
Total Lymphocyte: 40.4 %
WBC: 5.4 10*3/uL (ref 3.8–10.8)

## 2019-02-14 LAB — MEASLES/MUMPS/RUBELLA IMMUNITY
Mumps IgG: 87.6 AU/mL
Rubella: 2.61 index
Rubeola IgG: 15.8 AU/mL — ABNORMAL LOW

## 2019-02-14 LAB — HEPATITIS B SURFACE ANTIBODY, QUANTITATIVE: Hep B S AB Quant (Post): <5 m[IU]/mL — ABNORMAL LOW (ref >=10)

## 2019-02-14 LAB — COMPREHENSIVE METABOLIC PANEL
AG Ratio: 1.6 (calc) (ref 1.0–2.5)
ALT: 16 U/L (ref 6–29)
AST: 12 U/L (ref 10–35)
Albumin: 4.1 g/dL (ref 3.6–5.1)
Alkaline phosphatase (APISO): 70 U/L (ref 37–153)
BUN/Creatinine Ratio: 15 (calc) (ref 6–22)
BUN: 22 mg/dL (ref 7–25)
CO2: 23 mmol/L (ref 20–32)
Calcium: 9.3 mg/dL (ref 8.6–10.4)
Chloride: 107 mmol/L (ref 98–110)
Creat: 1.46 mg/dL — ABNORMAL HIGH (ref 0.50–1.05)
Globulin: 2.6 g/dL (calc) (ref 1.9–3.7)
Glucose, Bld: 91 mg/dL (ref 65–99)
Potassium: 4.8 mmol/L (ref 3.5–5.3)
Sodium: 138 mmol/L (ref 135–146)
Total Bilirubin: 0.4 mg/dL (ref 0.2–1.2)
Total Protein: 6.7 g/dL (ref 6.1–8.1)

## 2019-02-14 LAB — LIPID PANEL
Cholesterol: 132 mg/dL (ref <200)
HDL: 46 mg/dL — ABNORMAL LOW (ref >=50)
LDL Cholesterol (Calc): 69 mg/dL (calc)
Non-HDL Cholesterol (Calc): 86 mg/dL (calc) (ref <130)
Total CHOL/HDL Ratio: 2.9 (calc) (ref <5.0)
Triglycerides: 83 mg/dL (ref <150)

## 2019-02-14 LAB — TSH: TSH: 0.76 mIU/L

## 2019-02-14 LAB — VITAMIN D 25 HYDROXY (VIT D DEFICIENCY, FRACTURES): Vit D, 25-Hydroxy: 41 ng/mL (ref 30–100)

## 2019-02-14 MED ORDER — LIRAGLUTIDE -WEIGHT MANAGEMENT 18 MG/3ML ~~LOC~~ SOPN: PEN_INJECTOR | SUBCUTANEOUS | 5 refills | Status: DC

## 2019-02-14 NOTE — Telephone Encounter (Signed)
She has medicare and I think will need these vaccines at her pharmacy b/c if may be a cost  Cancel nurse visit please for now   It is not urgent to get the vaccines  Let pt know

## 2019-02-14 NOTE — Telephone Encounter (Signed)
Stop metformin  Will try to get Saxenda approved again for morbid obesity, HTN, HLD, OSA, on antipsychotics for mood disorder; failed adipex due to increased racing heart, anxiety and did not help lose weight, metformin caused rash   Fax Saxenda please

## 2019-02-14 NOTE — Telephone Encounter (Signed)
I called the pt and explained to her about the vaccines and the nurse visit was cancelled and pt understood.    Yannis Gumbs,cma

## 2019-02-15 NOTE — Telephone Encounter (Signed)
Patient has been informed.

## 2019-02-17 ENCOUNTER — Ambulatory Visit (INDEPENDENT_AMBULATORY_CARE_PROVIDER_SITE_OTHER): Payer: Medicare HMO | Admitting: Internal Medicine

## 2019-02-17 ENCOUNTER — Ambulatory Visit: Payer: Medicare HMO

## 2019-02-17 ENCOUNTER — Other Ambulatory Visit: Payer: Self-pay

## 2019-02-17 DIAGNOSIS — L509 Urticaria, unspecified: Secondary | ICD-10-CM | POA: Diagnosis not present

## 2019-02-17 DIAGNOSIS — R21 Rash and other nonspecific skin eruption: Secondary | ICD-10-CM | POA: Diagnosis not present

## 2019-02-17 DIAGNOSIS — R609 Edema, unspecified: Secondary | ICD-10-CM | POA: Diagnosis not present

## 2019-02-17 MED ORDER — TRIAMCINOLONE ACETONIDE 0.1 % EX CREA: 1.0000 "application " | TOPICAL_CREAM | Freq: Two times a day (BID) | CUTANEOUS | 0 refills | Status: DC

## 2019-02-17 MED ORDER — CAMPHOR-MENTHOL 0.5-0.5 % EX LOTN: 1.0000 "application " | TOPICAL_LOTION | CUTANEOUS | 0 refills | Status: DC | PRN

## 2019-02-17 NOTE — Progress Notes (Signed)
Virtual Visit via Video Note  I connected with Heather Sweeney  on 02/17/19 at  3:35 PM EDT by a video enabled telemedicine application and verified that I am speaking with the correct person using two identifiers.  Location patient: car Location provider:work  Persons participating in the virtual visit: patient, provider  I discussed the limitations of evaluation and management by telemedicine and the availability of in person appointments. The patient expressed understanding and agreed to proceed.   HPI: 1. C/o b/l leg edema new she has been eating more potatoe chips and is only taking spironolactone 12.5 mg daily. Lower legs swelling since last visit  Echo 08/01/2016  INTERPRETATION NORMAL LEFT VENTRICULAR SYSTOLIC FUNCTION WITH MILD LVH NORMAL RIGHT VENTRICULAR SYSTOLIC FUNCTION MILD VALVULAR REGURGITATION (See above) NO VALVULAR STENOSIS EF 55-60%  2. C/o hives to arms, legs back with itching new since starting Metformin and she has since stopped Metformin  She is on zyrtec but not tried anything else    ROS: See pertinent positives and negatives per HPI.  Past Medical History:  Diagnosis Date  . Allergic rhinitis   . Bipolar disorder ([place])   . Chronic low back pain   . CTS (carpal tunnel syndrome)    hands  . Depression   . Gallstones   . GERD (gastroesophageal reflux disease)   . Hypertension   . Laceration of right foot   . Onychomycosis   . OSA on CPAP   . Overactive bladder   . PVC's (premature ventricular contractions)   . Seizures ([place])    8th grade    Past Surgical History:  Procedure Laterality Date  . ABDOMINAL HYSTERECTOMY  07/08/2011  . ABDOMINAL HYSTERECTOMY     supracervical in 07/2011   . CARPAL TUNNEL RELEASE     right hand 1996  . CESAREAN SECTION     2012  . CHOLECYSTECTOMY N/A 01/12/2018   Procedure: LAPAROSCOPIC CHOLECYSTECTOMY WITH INTRAOPERATIVE CHOLANGIOGRAM ERAS PATHWAY;  Surgeon: Johnathan Hausen, MD;  Location: [facility];  Service:  General;  Laterality: N/A;  . DILATION AND CURETTAGE OF UTERUS     04/2011 benign endometrial polyp squamous metaplasia, inflamm, blood, mucous, benign   . FOOT SURGERY     Dr. Prudence Davidson   . LEEP     2006  . TUBAL LIGATION      Family History  Problem Relation Age of Onset  . Cancer Father        esophageal   . Hypertension Father   . Heart failure Mother   . Hypertension Mother   . Early death Mother   . Heart disease Mother        chf  . Breast cancer Sister 17  . Cancer Sister        breast  . Depression Sister   . Mental illness Sister   . Asthma Son   . Cancer Other        brain cancer    SOCIAL HX: married with kids    Current Outpatient Medications:  .  amLODipine (NORVASC) 5 MG tablet, Take 1 tablet (5 mg total) by mouth daily., Disp: 90 tablet, Rfl: 3 .  aspirin 81 MG EC tablet, Take 1 tablet (81 mg total) by mouth daily., Disp: 90 tablet, Rfl: 3 .  benztropine (COGENTIN) 1 MG tablet, Take 1 mg by mouth daily as needed (anxiety). , Disp: , Rfl:  .  camphor-menthol (SARNA) lotion, Apply 1 application topically as needed for itching., Disp: 222 mL, Rfl: 0 .  cetirizine (ZYRTEC) 10 MG tablet, Take 1 tablet (10 mg total) by mouth daily., Disp: 90 tablet, Rfl: 3 .  EPINEPHrine (EPIPEN 2-PAK) 0.3 mg/0.3 mL IJ SOAJ injection, Inject 0.3 mLs (0.3 mg total) into the muscle daily as needed (allergic reactions)., Disp: 2 Device, Rfl: 0 .  FLUoxetine (PROZAC) 20 MG capsule, Take 20 mg by mouth daily., Disp: , Rfl:  .  fluPHENAZine (PROLIXIN) 2.5 MG tablet, Take 2.5 mg by mouth at bedtime. , Disp: , Rfl:  .  fluticasone (FLONASE) 50 MCG/ACT nasal spray, Place 2 sprays into both nostrils daily., Disp: 16 g, Rfl: 11 .  gabapentin (NEURONTIN) 300 MG capsule, Take 1 capsule (300 mg total) by mouth 3 (three) times daily., Disp: 270 capsule, Rfl: 3 .  losartan (COZAAR) 100 MG tablet, Take 1 tablet (100 mg total) by mouth daily., Disp: 90 tablet, Rfl: 3 .  meloxicam (MOBIC) 15 MG tablet,  meloxicam 15 mg tablet  Take 1 tablet(s) every day by oral route with meals., Disp: , Rfl:  .  metoprolol succinate (TOPROL-XL) 25 MG 24 hr tablet, Take 1 tablet (25 mg total) by mouth daily. Take with or immediately following a meal., Disp: 90 tablet, Rfl: 3 .  oxyCODONE-acetaminophen (PERCOCET) 10-325 MG tablet, Take 1 tablet by mouth every 6 (six) hours as needed for pain. (Patient taking differently: Take 1 tablet by mouth every 8 (eight) hours as needed for pain (Dr. Mirna Mires pain). ), Disp: 30 tablet, Rfl: 0 .  pantoprazole (PROTONIX) 40 MG tablet, Take 1 tablet (40 mg total) by mouth daily., Disp: 90 tablet, Rfl: 3 .  polyethylene glycol (MIRALAX / GLYCOLAX) packet, Take 17 g by mouth daily as needed for mild constipation., Disp: , Rfl:  .  pravastatin (PRAVACHOL) 10 MG tablet, Take 1 tablet (10 mg total) by mouth daily., Disp: 90 tablet, Rfl: 3 .  spironolactone (ALDACTONE) 25 MG tablet, Take 0.5 tablets (12.5 mg total) by mouth daily., Disp: 45 tablet, Rfl: 3 .  triamcinolone cream (KENALOG) 0.1 %, Apply 1 application topically 2 (two) times daily. As needed to bumps on skin not on face, Disp: 454 g, Rfl: 0  EXAM:  VITALS per patient if applicable:  GENERAL: alert, oriented, appears well and in no acute distress  HEENT: atraumatic, conjunttiva clear, no obvious abnormalities on inspection of external nose and ears  NECK: normal movements of the head and neck  LUNGS: on inspection no signs of respiratory distress, breathing rate appears normal, no obvious gross SOB, gasping or wheezing  CV: no obvious cyanosis  MS: moves all visible extremities without noticeable abnormality  PSYCH/NEURO: pleasant and cooperative, no obvious depression or anxiety, speech and thought processing grossly intact  Mild leg edema  Skin: hives to arms I.e left b/l legs and trunk  ASSESSMENT AND PLAN:  Discussed the following assessment and plan:  Hives - Plan: triamcinolone cream (KENALOG) 0.1 %,  camphor-menthol (SARNA) lotion  Rash - Plan: triamcinolone cream (KENALOG) 0.1 %, camphor-menthol (SARNA) lotion -stop metformin put on allergy list  Continue zyrtec   Edema, unspecified type -Rx compression knee highs 15-20 mmhg 2 prs mailed to pt  -reduce salt intake  -echo reviewed 2017 normal overall  Call back if not better     I discussed the assessment and treatment plan with the patient. The patient was provided an opportunity to ask questions and all were answered. The patient agreed with the plan and demonstrated an understanding of the instructions.   The patient was advised to  call back or seek an in-person evaluation if the symptoms worsen or if the condition fails to improve as anticipated.  Time 15 minutes  Delorise Jackson, MD

## 2019-02-17 NOTE — Patient Instructions (Addendum)
Try Sarna lotion for itching    Try knee highs for leg swelling during the day  -[facility] [facility] in [place]  [address]    Try steroid cream triamcinolone up to 2x per day for itching not on your face    Edema  Edema is when you have too much fluid in your body or under your skin. Edema may make your legs, feet, and ankles swell up. Swelling is also common in looser tissues, like around your eyes. This is a common condition. It gets more common as you get older. There are many possible causes of edema. Eating too much salt (sodium) and being on your feet or sitting for a long time can cause edema in your legs, feet, and ankles. Hot weather may make edema worse. Edema is usually painless. Your skin may look swollen or shiny. Follow these instructions at home:  Keep the swollen body part raised (elevated) above the level of your heart when you are sitting or lying down.  Do not sit still or stand for a long time.  Do not wear tight clothes. Do not wear garters on your upper legs.  Exercise your legs. This can help the swelling go down.  Wear elastic bandages or support stockings as told by your doctor.  Eat a low-salt (low-sodium) diet to reduce fluid as told by your doctor.  Depending on the cause of your swelling, you may need to limit how much fluid you drink (fluid restriction).  Take over-the-counter and prescription medicines only as told by your doctor. Contact a doctor if:  Treatment is not working.  You have heart, liver, or kidney disease and have symptoms of edema.  You have sudden and unexplained weight gain. Get help right away if:  You have shortness of breath or chest pain.  You cannot breathe when you lie down.  You have pain, redness, or warmth in the swollen areas.  You have heart, liver, or kidney disease and get edema all of a sudden.  You have a fever and your symptoms get worse all of a sudden. Summary  Edema is when you  have too much fluid in your body or under your skin.  Edema may make your legs, feet, and ankles swell up. Swelling is also common in looser tissues, like around your eyes.  Raise (elevate) the swollen body part above the level of your heart when you are sitting or lying down.  Follow your doctor's instructions about diet and how much fluid you can drink (fluid restriction). This information is not intended to replace advice given to you by your health care provider. Make sure you discuss any questions you have with your health care provider. Document Released: 03/10/2008 Document Revised: 10/10/2016 Document Reviewed: 10/10/2016 Elsevier Interactive Patient Education  2019 [place] (urticaria) are itchy, red, swollen areas on the skin. Hives can appear on any part of the body. Hives often fade within 24 hours (acute hives). Sometimes, new hives appear after old ones fade and the cycle can continue for several days or weeks (chronic hives). Hives do not spread from person to person (are not contagious). Hives come from the body's reaction to something a person is allergic to (allergen), something that causes irritation, or various other triggers. When a person is exposed to a trigger, his or her body releases a chemical (histamine) that causes redness, itching, and swelling. Hives can appear right after exposure to a trigger or hours  later. What are the causes? This condition may be caused by:  Allergies to foods or ingredients.  Insect bites or stings.  Exposure to pollen or pets.  Contact with latex or chemicals.  Spending time in sunlight, heat, or cold (exposure).  Exercise.  Stress.  Certain medicines. You can also get hives from other medical conditions and treatments, such as:  Viruses, including the common cold.  Bacterial infections, such as urinary tract infections and strep throat.  Certain medicines.  Allergy shots.  Blood transfusions.  Sometimes, the cause of this condition is not known (idiopathic hives). What increases the risk? You are more likely to develop this condition if you:  Are a woman.  Have food allergies, especially to citrus fruits, milk, eggs, peanuts, tree nuts, or shellfish.  Are allergic to: ? Medicines. ? Latex. ? Insects. ? Animals. ? Pollen. What are the signs or symptoms? Common symptoms of this condition include raised, itchy, red or white bumps or patches on your skin. These areas may:  Become large and swollen (welts).  Change in shape and location, quickly and repeatedly.  Be separate hives or connect over a large area of skin.  Sting or become painful.  Turn white when pressed in the center (blanch). In severe cases, yourhands, feet, and face may also become swollen. This may occur if hives develop deeper in your skin. How is this diagnosed? This condition may be diagnosed by your symptoms, medical history, and physical exam.  Your skin, urine, or blood may be tested to find out what is causing your hives and to rule out other health issues.  Your health care provider may also remove a small sample of skin from the affected area and examine it under a microscope (biopsy). How is this treated? Treatment for this condition depends on the cause and severity of your symptoms. Your health care provider may recommend using cool, wet cloths (cool compresses) or taking cool showers to relieve itching. Treatment may include:  Medicines that help: ? Relieve itching (antihistamines). ? Reduce swelling (corticosteroids). ? Treat infection (antibiotics).  An injectable medicine (omalizumab). Your health care provider may prescribe this if you have chronic idiopathic hives and you continue to have symptoms even after treatment with antihistamines. Severe cases may require an emergency injection of adrenaline (epinephrine) to prevent a life-threatening allergic reaction (anaphylaxis).  Follow these instructions at home: Medicines  Take and apply over-the-counter and prescription medicines only as told by your health care provider.  If you were prescribed an antibiotic medicine, take it as told by your health care provider. Do not stop using the antibiotic even if you start to feel better. Skin care  Apply cool compresses to the affected areas.  Do not scratch or rub your skin. General instructions  Do not take hot showers or baths. This can make itching worse.  Do not wear tight-fitting clothing.  Use sunscreen and wear protective clothing when you are outside.  Avoid any substances that cause your hives. Keep a journal to help track what causes your hives. Write down: ? What medicines you take. ? What you eat and drink. ? What products you use on your skin.  Keep all follow-up visits as told by your health care provider. This is important. Contact a health care provider if:  Your symptoms are not controlled with medicine.  Your joints are painful or swollen. Get help right away if:  You have a fever.  You have pain in your abdomen.  Your tongue  or lips are swollen.  Your eyelids are swollen.  Your chest or throat feels tight.  You have trouble breathing or swallowing. These symptoms may represent a serious problem that is an emergency. Do not wait to see if the symptoms will go away. Get medical help right away. Call your local emergency services (911 in the [place] Do not drive yourself to the hospital. Summary  Hives (urticaria) are itchy, red, swollen areas on your skin. Hives come from the body's reaction to something a person is allergic to (allergen), something that causes irritation, or various other triggers.  Treatment for this condition depends on the cause and severity of your symptoms.  Avoid any substances that cause your hives. Keep a journal to help track what causes your hives.  Take and apply over-the-counter and prescription  medicines only as told by your health care provider.  Keep all follow-up visits as told by your health care provider. This is important. This information is not intended to replace advice given to you by your health care provider. Make sure you discuss any questions you have with your health care provider. Document Released: 09/22/2005 Document Revised: 04/07/2018 Document Reviewed: 04/07/2018 Elsevier Interactive Patient Education  2019 [employer].

## 2019-02-18 DIAGNOSIS — M545 Low back pain: Secondary | ICD-10-CM | POA: Diagnosis not present

## 2019-02-18 DIAGNOSIS — Z79891 Long term (current) use of opiate analgesic: Secondary | ICD-10-CM | POA: Diagnosis not present

## 2019-02-18 DIAGNOSIS — G894 Chronic pain syndrome: Secondary | ICD-10-CM | POA: Diagnosis not present

## 2019-02-19 ENCOUNTER — Encounter: Payer: Self-pay | Admitting: Emergency Medicine

## 2019-02-19 ENCOUNTER — Emergency Department
Admission: EM | Admit: 2019-02-19 | Discharge: 2019-02-19 | Disposition: A | Discharge status: Home / Self Care | Payer: Medicare HMO | Attending: Student in an Organized Health Care Education/Training Program | Admitting: Student in an Organized Health Care Education/Training Program

## 2019-02-19 ENCOUNTER — Other Ambulatory Visit: Payer: Self-pay

## 2019-02-19 DIAGNOSIS — T783XXA Angioneurotic edema, initial encounter: Secondary | ICD-10-CM

## 2019-02-19 DIAGNOSIS — L509 Urticaria, unspecified: Secondary | ICD-10-CM | POA: Diagnosis present

## 2019-02-19 MED ORDER — EPINEPHRINE 0.3 MG/0.3ML IJ SOAJ
0.3000 mg | Freq: Once | INTRAMUSCULAR | Status: AC
  Administered 2019-02-19: 0.3 mg via INTRAMUSCULAR
  Filled 2019-02-19: qty 0.3

## 2019-02-19 MED ORDER — FAMOTIDINE IN NACL 20-0.9 MG/50ML-% IV SOLN
20.0000 mg | Freq: Once | INTRAVENOUS | Status: AC
  Administered 2019-02-19: 20 mg via INTRAVENOUS
  Filled 2019-02-19: qty 50

## 2019-02-19 MED ORDER — DIPHENHYDRAMINE HCL 50 MG/ML IJ SOLN
12.5000 mg | Freq: Once | INTRAMUSCULAR | Status: AC
  Administered 2019-02-19: 12.5 mg via INTRAVENOUS
  Filled 2019-02-19: qty 1

## 2019-02-19 MED ORDER — PREDNISONE 10 MG PO TABS: 10.0000 mg | ORAL_TABLET | Freq: Every day | ORAL | 0 refills | Status: DC

## 2019-02-19 MED ORDER — EPINEPHRINE 0.3 MG/0.3ML IJ SOAJ: 0.3000 mg | INTRAMUSCULAR | 1 refills | Status: DC | PRN

## 2019-02-19 MED ORDER — METHYLPREDNISOLONE SODIUM SUCC 125 MG IJ SOLR
125.0000 mg | Freq: Once | INTRAMUSCULAR | Status: AC
  Administered 2019-02-19: 13:00:00 125 mg via INTRAVENOUS
  Filled 2019-02-19: qty 2

## 2019-02-19 MED ORDER — AMLODIPINE BESYLATE 5 MG PO TABS: 5.0000 mg | ORAL_TABLET | Freq: Every day | ORAL | 11 refills | Status: DC

## 2019-02-19 NOTE — ED Provider Notes (Signed)
[facility] Emergency Department Provider Note    First MD Initiated Contact with Patient 02/19/19 1229     (approximate)  I have reviewed the triage vital signs and the nursing notes.   HISTORY  Chief Complaint Angioedema and Rash    HPI Heather Sweeney is a 53 y.o. female below listed past medical history presents the ER for hives  and swelling of the left side of her upper lip that started this morning.  Does have a history of allergic reaction and angioedema to lisinopril.  Is currently taking losartan.  No other medication changes.  Denies any new medications.  New new foods.  No new soaps or detergents.  Denies any shortness of breath.  No trouble swallowing or speaking.   Past Medical History:  Diagnosis Date  . Allergic rhinitis   . Bipolar disorder ([place])   . Chronic low back pain   . CTS (carpal tunnel syndrome)    hands  . Depression   . Gallstones   . GERD (gastroesophageal reflux disease)   . Hypertension   . Laceration of right foot   . Onychomycosis   . OSA on CPAP   . Overactive bladder   . PVC's (premature ventricular contractions)   . Seizures ([facility])    8th grade   Family History  Problem Relation Age of Onset  . Cancer Father        esophageal   . Hypertension Father   . Heart failure Mother   . Hypertension Mother   . Early death Mother   . Heart disease Mother        chf  . Breast cancer Sister 61  . Cancer Sister        breast  . Depression Sister   . Mental illness Sister   . Asthma Son   . Cancer Other        brain cancer   Past Surgical History:  Procedure Laterality Date  . ABDOMINAL HYSTERECTOMY  07/08/2011  . ABDOMINAL HYSTERECTOMY     supracervical in 07/2011   . CARPAL TUNNEL RELEASE     right hand 1996  . CESAREAN SECTION     2012  . CHOLECYSTECTOMY N/A 01/12/2018   Procedure: LAPAROSCOPIC CHOLECYSTECTOMY WITH INTRAOPERATIVE CHOLANGIOGRAM ERAS PATHWAY;  Surgeon: Johnathan Hausen, MD;   Location: [facility] ORS;  Service: General;  Laterality: N/A;  . DILATION AND CURETTAGE OF UTERUS     04/2011 benign endometrial polyp squamous metaplasia, inflamm, blood, mucous, benign   . FOOT SURGERY     Dr. Prudence Davidson   . LEEP     2006  . TUBAL LIGATION     Patient Active Problem List   Diagnosis Date Noted  . Vitamin D deficiency 02/01/2019  . Mood disorder ([place]) 12/24/2018  . HLD (hyperlipidemia) 09/01/2018  . Prediabetes 09/01/2018  . Morbid obesity ([place]) 09/01/2018  . Chronic pain 08/31/2018  . Allergic rhinitis 08/31/2018  . S/P laparoscopic cholecystectomy April 2019 01/12/2018  . Angioedema 07/07/2017  . Bilateral carpal tunnel syndrome 11/03/2016  . Frequent PVCs 09/24/2016  . Morbid obesity with BMI of 40.0-44.9, adult ([place]) 08/07/2016  . Essential hypertension 07/10/2016  . OSA on CPAP 07/10/2016      Prior to Admission medications   Medication Sig Start Date End Date Taking? Authorizing Provider  amLODipine (NORVASC) 5 MG tablet Take 1 tablet (5 mg total) by mouth daily. 12/24/18   McLean-Scocuzza, Nino Glow, MD  aspirin 81 MG EC tablet Take 1  tablet (81 mg total) by mouth daily. 12/24/18   McLean-Scocuzza, Nino Glow, MD  benztropine (COGENTIN) 1 MG tablet Take 1 mg by mouth daily as needed (anxiety).     [provider]  camphor-menthol Timoteo Ace) lotion Apply 1 application topically as needed for itching. 02/17/19   McLean-Scocuzza, Nino Glow, MD  cetirizine (ZYRTEC) 10 MG tablet Take 1 tablet (10 mg total) by mouth daily. 12/24/18   McLean-Scocuzza, Nino Glow, MD  EPINEPHrine (EPIPEN 2-PAK) 0.3 mg/0.3 mL IJ SOAJ injection Inject 0.3 mLs (0.3 mg total) into the muscle daily as needed (allergic reactions). 12/24/18   McLean-Scocuzza, Nino Glow, MD  FLUoxetine (PROZAC) 20 MG capsule Take 20 mg by mouth daily.    [provider]  fluPHENAZine (PROLIXIN) 2.5 MG tablet Take 2.5 mg by mouth at bedtime.     [provider]  fluticasone (FLONASE) 50 MCG/ACT nasal spray  Place 2 sprays into both nostrils daily. 08/31/18   McLean-Scocuzza, Nino Glow, MD  gabapentin (NEURONTIN) 300 MG capsule Take 1 capsule (300 mg total) by mouth 3 (three) times daily. 12/24/18   McLean-Scocuzza, Nino Glow, MD  losartan (COZAAR) 100 MG tablet Take 1 tablet (100 mg total) by mouth daily. 12/24/18   McLean-Scocuzza, Nino Glow, MD  meloxicam (MOBIC) 15 MG tablet meloxicam 15 mg tablet  Take 1 tablet(s) every day by oral route with meals.    [provider]  metoprolol succinate (TOPROL-XL) 25 MG 24 hr tablet Take 1 tablet (25 mg total) by mouth daily. Take with or immediately following a meal. 12/24/18   McLean-Scocuzza, Nino Glow, MD  oxyCODONE-acetaminophen (PERCOCET) 10-325 MG tablet Take 1 tablet by mouth every 6 (six) hours as needed for pain. Patient taking differently: Take 1 tablet by mouth every 8 (eight) hours as needed for pain (Dr. Mirna Mires pain).  01/12/18   Johnathan Hausen, MD  pantoprazole (PROTONIX) 40 MG tablet Take 1 tablet (40 mg total) by mouth daily. 12/24/18   McLean-Scocuzza, Nino Glow, MD  polyethylene glycol (MIRALAX / GLYCOLAX) packet Take 17 g by mouth daily as needed for mild constipation.    [provider]  pravastatin (PRAVACHOL) 10 MG tablet Take 1 tablet (10 mg total) by mouth daily. 12/24/18   McLean-Scocuzza, Nino Glow, MD  spironolactone (ALDACTONE) 25 MG tablet Take 0.5 tablets (12.5 mg total) by mouth daily. 12/24/18   McLean-Scocuzza, Nino Glow, MD  triamcinolone cream (KENALOG) 0.1 % Apply 1 application topically 2 (two) times daily. As needed to bumps on skin not on face 02/17/19   McLean-Scocuzza, Nino Glow, MD    Allergies Cephalexin; Hctz [hydrochlorothiazide]; Influenza vaccines; Penicillins; Bee venom; Losartan; Metformin and related; and Lisinopril    Social History Social History   Tobacco Use  . Smoking status: Never Smoker  . Smokeless tobacco: Current User    Types: Snuff  . Tobacco comment: Is in process of decreasing amount   Substance Use Topics  . Alcohol use: No    Alcohol/week: 0.0 standard drinks  . Drug use: No    Review of Systems Patient denies headaches, rhinorrhea, blurry vision, numbness, shortness of breath, chest pain, edema, cough, abdominal pain, nausea, vomiting, diarrhea, dysuria, fevers, rashes or hallucinations unless otherwise stated above in HPI. ____________________________________________   PHYSICAL EXAM:  VITAL SIGNS: Vitals:   02/19/19 1224  BP: 121/67  Pulse: 80  Resp: 18  Temp: 98.7 F (37.1 C)  SpO2: 97%    Constitutional: Alert and oriented.  Eyes: Conjunctivae are normal.  Head: Atraumatic. Nose:  No congestion/rhinnorhea. Mouth/Throat: Mucous membranes are moist.  Angioedema to the left upper and left lower lip.  No uvular edema.  Neck: No stridor. Painless ROM.  Cardiovascular: Normal rate, regular rhythm. Grossly normal heart sounds.  Good peripheral circulation. Respiratory: Normal respiratory effort.  No retractions. Lungs CTAB. Gastrointestinal: Soft and nontender. No distention. No abdominal bruits. No CVA tenderness. Genitourinary:  Musculoskeletal: No lower extremity tenderness nor edema.  No joint effusions. Neurologic:  Normal speech and language. No gross focal neurologic deficits are appreciated. No facial droop Skin:  Skin is warm, dry and intact. Scattered urticaria Psychiatric: Mood and affect are normal. Speech and behavior are normal.  ____________________________________________   LABS (all labs ordered are listed, but only abnormal results are displayed)  No results found for this or any previous visit (from the past 24 hour(s)). ____________________________________________  EK_______________  RADIOLOGY   ____________________________________________   PROCEDURES  Procedure(s) performed:  Procedures    Critical Care performed: no ____________________________________________   INITIAL IMPRESSION / ASSESSMENT AND PLAN / ED  COURSE  Pertinent labs & imaging results that were available during my care of the patient were reviewed by me and considered in my medical decision making (see chart for details).   DDX: angioedema, anaphylaxis, urticaria  Heather Sweeney is a 53 y.o. who presents to the ED with evidence of angioedema and urticaria.  Patient be treated for allergic reaction.  The likely secondary to losartan.  From that she should stop taking this medication.  She currently protecting her airway.  Patient will be observed in the ER.  Clinical Course as of Feb 19 1536  Sat Feb 19, 2019  1535 Patient reassessed with improvement in symptoms.  Still has mild angioedema of the left upper lip but it is softer and improving.  Her urticaria is resolved.  Will observe for completion of 4-hour observation.  If symptoms continue to stabilize and improve will be discharged home.   [PR]    Clinical Course User Index [PR] Merlyn Lot, MD    The patient was evaluated in Emergency Department today for the symptoms described in the history of present illness. He/she was evaluated in the context of the global COVID-19 pandemic, which necessitated consideration that the patient might be at risk for infection with the SARS-CoV-2 virus that causes COVID-19. Institutional protocols and algorithms that pertain to the evaluation of patients at risk for COVID-19 are in a state of rapid change based on information released by regulatory bodies including the CDC and federal and state organizations. These policies and algorithms were followed during the patient's care in the ED.  As part of my medical decision making, I reviewed the following data within the [place] notes reviewed and incorporated, Labs reviewed, notes from prior ED visits and [place] Controlled Substance Database   ____________________________________________   FINAL CLINICAL IMPRESSION(S) / ED DIAGNOSES  Final diagnoses:   Angioedema, initial encounter      NEW MEDICATIONS STARTED DURING THIS VISIT:  New Prescriptions   No medications on file     Note:  This document was prepared using Dragon voice recognition software and may include unintentional dictation errors.    Merlyn Lot, MD 02/19/19 1538

## 2019-02-19 NOTE — ED Notes (Addendum)
Pt seen by PCP on 5/14, reports noticed systemic rash after stopping Metformin, pt states she only took it for 1 day.   Pt noticed upper lip started swelling this  Morning.  Pt also has hives and swelling of the tongue.  Pt does take Losartan which she has been taking daily for several months. Pt has hx of same reaction to lisinopril in the past. Pt hasnt missed any doses.

## 2019-02-19 NOTE — ED Notes (Signed)
Patient denies pain and is resting comfortably.  

## 2019-02-19 NOTE — Discharge Instructions (Signed)
Do not take losartan anymore.  I have written you prescription for Norvasc for blood pressure.  Please follow-up with your PCP.

## 2019-02-19 NOTE — ED Triage Notes (Signed)
Rash started yesterday. Upper L lip swelling started today. No resp distress.

## 2019-02-19 NOTE — ED Notes (Signed)
Pt reports some improvement in swelling, denies any difficulty breathing reports no itching at this time as well. Continue to monitor. VSS. Pt resting in dark room.

## 2019-02-21 ENCOUNTER — Telehealth: Payer: Self-pay | Admitting: Internal Medicine

## 2019-02-21 NOTE — Telephone Encounter (Signed)
Call patient does she want to see an allergy doctor about her hives and allergic reaction?  [place]

## 2019-02-22 ENCOUNTER — Ambulatory Visit (INDEPENDENT_AMBULATORY_CARE_PROVIDER_SITE_OTHER): Payer: Medicare HMO | Admitting: Internal Medicine

## 2019-02-22 ENCOUNTER — Other Ambulatory Visit: Payer: Self-pay

## 2019-02-22 DIAGNOSIS — L509 Urticaria, unspecified: Secondary | ICD-10-CM

## 2019-02-22 DIAGNOSIS — N179 Acute kidney failure, unspecified: Secondary | ICD-10-CM

## 2019-02-22 DIAGNOSIS — R7989 Other specified abnormal findings of blood chemistry: Secondary | ICD-10-CM

## 2019-02-22 DIAGNOSIS — T783XXD Angioneurotic edema, subsequent encounter: Secondary | ICD-10-CM

## 2019-02-22 DIAGNOSIS — I10 Essential (primary) hypertension: Secondary | ICD-10-CM

## 2019-02-22 DIAGNOSIS — Z87898 Personal history of other specified conditions: Secondary | ICD-10-CM

## 2019-02-22 MED ORDER — HYDROCORTISONE 2.5 % EX CREA: TOPICAL_CREAM | Freq: Two times a day (BID) | CUTANEOUS | 0 refills | Status: DC

## 2019-02-22 NOTE — Progress Notes (Signed)
Telephone Note  I connected with Heather Sweeney   on 02/22/19 at  3:40 PM EDT by a telephone nd verified that I am speaking with the correct person using two identifiers.  Location patient: home Location provider:work  Persons participating in the virtual visit: patient, provider  I discussed the limitations of evaluation and management by telemedicine and the availability of in person appointments. The patient expressed understanding and agreed to proceed.   HPI: 1. Hives/urticaria c/w angioedema ED visit 02/19/2019. She was also still taking Aspirin 81 mg qd and mobic 15 mg qd advised to stop both as both could cause hives/angioedema  She denies have recent bug bites but does report with certain laundry detergents her skin itches after use. She is on zyrtec and continues to take but still itching and rash is still on body arms and legs. She has had allergy testing with Dr. Donneta Romberg in the past. Rash is still on body since 02/17/2019.  No new psychiatric medications or other mediations but mobic is fairly new for her   She saw Dr. Donneta Romberg allergy in 2018 and 2019 and was given mometasone topical and hydroxyzine for hives which helped and testing for allergies. She is on zyrtec now. She does not have scheduled f/u    2. HTN-on losartan 100 mg qd, toprol xl 25 mg qd, spironolactone 12.5 mg qd norvasc 5 mg qd but due to #1 and previous sx's with ACEI stopping losartan 100 mg though pt had been tolerating it up until this pt.     ROS: See pertinent positives and negatives per HPI.  Past Medical History:  Diagnosis Date  . Allergic rhinitis   . Bipolar disorder ([place])   . Chronic low back pain   . CTS (carpal tunnel syndrome)    hands  . Depression   . Gallstones   . GERD (gastroesophageal reflux disease)   . Hypertension   . Laceration of right foot   . Onychomycosis   . OSA on CPAP   . Overactive bladder   . PVC's (premature ventricular contractions)   . Seizures ([place])    8th grade     Past Surgical History:  Procedure Laterality Date  . ABDOMINAL HYSTERECTOMY  07/08/2011  . ABDOMINAL HYSTERECTOMY     supracervical in 07/2011   . CARPAL TUNNEL RELEASE     right hand 1996  . CESAREAN SECTION     2012  . CHOLECYSTECTOMY N/A 01/12/2018   Procedure: LAPAROSCOPIC CHOLECYSTECTOMY WITH INTRAOPERATIVE CHOLANGIOGRAM ERAS PATHWAY;  Surgeon: Johnathan Hausen, MD;  Location: [facility];  Service: General;  Laterality: N/A;  . DILATION AND CURETTAGE OF UTERUS     04/2011 benign endometrial polyp squamous metaplasia, inflamm, blood, mucous, benign   . FOOT SURGERY     Dr. Prudence Davidson   . LEEP     2006  . TUBAL LIGATION      Family History  Problem Relation Age of Onset  . Cancer Father        esophageal   . Hypertension Father   . Heart failure Mother   . Hypertension Mother   . Early death Mother   . Heart disease Mother        chf  . Breast cancer Sister 46  . Cancer Sister        breast  . Depression Sister   . Mental illness Sister   . Asthma Son   . Cancer Other        brain cancer  SOCIAL HX: married with 2 kids    Current Outpatient Medications:  .  amLODipine (NORVASC) 5 MG tablet, Take 1 tablet (5 mg total) by mouth daily., Disp: 30 tablet, Rfl: 11 .  aspirin 81 MG EC tablet, Take 1 tablet (81 mg total) by mouth daily., Disp: 90 tablet, Rfl: 3 .  benztropine (COGENTIN) 1 MG tablet, Take 1 mg by mouth daily as needed (anxiety). , Disp: , Rfl:  .  camphor-menthol (SARNA) lotion, Apply 1 application topically as needed for itching., Disp: 222 mL, Rfl: 0 .  cetirizine (ZYRTEC) 10 MG tablet, Take 1 tablet (10 mg total) by mouth daily., Disp: 90 tablet, Rfl: 3 .  EPINEPHrine (EPIPEN 2-PAK) 0.3 mg/0.3 mL IJ SOAJ injection, Inject 0.3 mLs (0.3 mg total) into the muscle daily as needed (allergic reactions)., Disp: 2 Device, Rfl: 0 .  EPINEPHrine 0.3 mg/0.3 mL IJ SOAJ injection, Inject 0.3 mLs (0.3 mg total) into the muscle as needed for anaphylaxis., Disp: 1 Device,  Rfl: 1 .  FLUoxetine (PROZAC) 20 MG capsule, Take 20 mg by mouth daily., Disp: , Rfl:  .  fluPHENAZine (PROLIXIN) 2.5 MG tablet, Take 2.5 mg by mouth at bedtime. , Disp: , Rfl:  .  fluticasone (FLONASE) 50 MCG/ACT nasal spray, Place 2 sprays into both nostrils daily., Disp: 16 g, Rfl: 11 .  gabapentin (NEURONTIN) 300 MG capsule, Take 1 capsule (300 mg total) by mouth 3 (three) times daily., Disp: 270 capsule, Rfl: 3 .  hydrocortisone 2.5 % cream, Apply topically 2 (two) times daily. Face for rash, Disp: 60 g, Rfl: 0 .  losartan (COZAAR) 100 MG tablet, Take 1 tablet (100 mg total) by mouth daily., Disp: 90 tablet, Rfl: 3 .  meloxicam (MOBIC) 15 MG tablet, meloxicam 15 mg tablet  Take 1 tablet(s) every day by oral route with meals., Disp: , Rfl:  .  metoprolol succinate (TOPROL-XL) 25 MG 24 hr tablet, Take 1 tablet (25 mg total) by mouth daily. Take with or immediately following a meal., Disp: 90 tablet, Rfl: 3 .  oxyCODONE-acetaminophen (PERCOCET) 10-325 MG tablet, Take 1 tablet by mouth every 6 (six) hours as needed for pain. (Patient taking differently: Take 1 tablet by mouth every 8 (eight) hours as needed for pain (Dr. Mirna Mires pain). ), Disp: 30 tablet, Rfl: 0 .  pantoprazole (PROTONIX) 40 MG tablet, Take 1 tablet (40 mg total) by mouth daily., Disp: 90 tablet, Rfl: 3 .  polyethylene glycol (MIRALAX / GLYCOLAX) packet, Take 17 g by mouth daily as needed for mild constipation., Disp: , Rfl:  .  pravastatin (PRAVACHOL) 10 MG tablet, Take 1 tablet (10 mg total) by mouth daily., Disp: 90 tablet, Rfl: 3 .  predniSONE (DELTASONE) 10 MG tablet, Take 1 tablet (10 mg total) by mouth daily. Day 1-2: Take 50 mg  ( 5 pills) Day 3-4 : Take 40 mg (4pills) Day 5-6: Take 30 mg (3 pills) Day 7-8:  Take 20 mg (2 pills) Day 9:  Take 33m (1 pill), Disp: 29 tablet, Rfl: 0 .  spironolactone (ALDACTONE) 25 MG tablet, Take 0.5 tablets (12.5 mg total) by mouth daily., Disp: 45 tablet, Rfl: 3 .  triamcinolone cream  (KENALOG) 0.1 %, Apply 1 application topically 2 (two) times daily. As needed to bumps on skin not on face, Disp: 454 g, Rfl: 0 .  hydrOXYzine (ATARAX/VISTARIL) 25 MG tablet, Take 1 tablet (25 mg total) by mouth every 8 (eight) hours as needed for itching. Or itchy rash do not take  with zyrtec at the same time, Disp: 90 tablet, Rfl: 0  EXAM:  VITALS per patient if applicable:  GENERAL: alert, oriented, appears well and in no acute distress  PSYCH/NEURO: pleasant and cooperative, no obvious depression or anxiety, speech and thought processing grossly intact  ASSESSMENT AND PLAN:  Discussed the following assessment and plan:  Hives - Plan: hydrocortisone 2.5 % cream bid to face, hydrOXYzine (ATARAX/VISTARIL) 25 MG tablet, TMC bid prn to body  Angioedema, subsequent encounter  -d/c Aspirin 81, Mobic/NSAID, losartan 100 mg qd  -rec unscented laundry detergents  -prn Zyrtec and hydroxyzine 25 mg tid prn but not together at the same time  -repeat and w/u with labs bmet, C4, urine na and creatinine, ana, CRP, ESR  HTN STOP losartan 100 mg qd Continue toprol xl 25 mg qd (could titrate up), spironolactone 12.5 mg qd, norvasc 5 mg qd -nurse visit for BP check 02/24/2019 and labs   Elevated creatinine  -repeat and w/u with labs bmet, C4, urine na and creatinine, ana, CRP, ESR  HM Declines flu shot allergic  Tdap utd due 04/18/2019  Allergic to pna vaccine Consider shingrix but given allergies use caution Not immune MMR, hep B titer in futureuse caution though with h/o allergies with all vaccines  h/o supracervical hysterectomy ([facility]), pap 02/29/16 negative no comment HPV, another pap 06/23/17 neg pap neg HPV  -h/o LEEP 2006  mammo due referredprev and given # today to call to schedulepts states when COVID 19 over   Colonoscopy referredappt pending 03/14/2019 Dr. Jonathon Bellows   Echo Dr.Kowalski  -nl LV function EF 60% moderate mitral insuff and mild tricuspid insufficiency  treadmill stress test neg 07/30/13    HIV neg 02/04/16  Ob/gyn appt pending bladder leakage I.e bladder sling vs other     I discussed the assessment and treatment plan with the patient. The patient was provided an opportunity to ask questions and all were answered. The patient agreed with the plan and demonstrated an understanding of the instructions.   The patient was advised to call back or seek an in-person evaluation if the symptoms worsen or if the condition fails to improve as anticipated.  Time spent 15 minutes  Delorise Jackson, MD

## 2019-02-23 ENCOUNTER — Encounter: Payer: Self-pay | Admitting: Internal Medicine

## 2019-02-23 ENCOUNTER — Telehealth: Payer: Self-pay | Admitting: Internal Medicine

## 2019-02-23 DIAGNOSIS — L509 Urticaria, unspecified: Secondary | ICD-10-CM

## 2019-02-23 HISTORY — DX: Urticaria, unspecified: L50.9

## 2019-02-23 MED ORDER — HYDROXYZINE HCL 25 MG PO TABS: 25.0000 mg | ORAL_TABLET | Freq: Three times a day (TID) | ORAL | 0 refills | Status: DC | PRN

## 2019-02-23 NOTE — Telephone Encounter (Signed)
Call pt to  [place] lab visit and  2.nurse visit for blood pressure check same day if can do 5/21 or 5/26  Make sure she wears a mask   Advise for hives to face use  1. Hydrocortisone 2.5 up to 2x per day if on her face  2. For body use triamcinolone 2x per day  3. For itching take hydroxyzine 25 mg up to 3x per day as needed but if you take this not recommended you take zyrtec at the same time can make you drowsy  4. Use unscented laundry detergent and soaps  5. Stop losartan 100 mg daily and we will check her blood pressure same day comes in for labs   [place]

## 2019-02-24 ENCOUNTER — Other Ambulatory Visit: Payer: Self-pay

## 2019-02-24 ENCOUNTER — Ambulatory Visit (INDEPENDENT_AMBULATORY_CARE_PROVIDER_SITE_OTHER): Payer: Medicare HMO

## 2019-02-24 ENCOUNTER — Ambulatory Visit: Payer: Medicare HMO

## 2019-02-24 ENCOUNTER — Other Ambulatory Visit: Payer: Self-pay | Admitting: Internal Medicine

## 2019-02-24 ENCOUNTER — Other Ambulatory Visit (INDEPENDENT_AMBULATORY_CARE_PROVIDER_SITE_OTHER): Payer: Medicare HMO

## 2019-02-24 VITALS — BP 140/70 | HR 70

## 2019-02-24 DIAGNOSIS — N179 Acute kidney failure, unspecified: Secondary | ICD-10-CM

## 2019-02-24 DIAGNOSIS — R7989 Other specified abnormal findings of blood chemistry: Secondary | ICD-10-CM

## 2019-02-24 DIAGNOSIS — I10 Essential (primary) hypertension: Secondary | ICD-10-CM

## 2019-02-24 DIAGNOSIS — T783XXD Angioneurotic edema, subsequent encounter: Secondary | ICD-10-CM | POA: Diagnosis not present

## 2019-02-24 DIAGNOSIS — L509 Urticaria, unspecified: Secondary | ICD-10-CM | POA: Diagnosis not present

## 2019-02-24 LAB — BASIC METABOLIC PANEL
BUN: 11 mg/dL (ref 6–23)
CO2: 27 mEq/L (ref 19–32)
Calcium: 9 mg/dL (ref 8.4–10.5)
Chloride: 102 mEq/L (ref 96–112)
Creatinine, Ser: 1.03 mg/dL (ref 0.40–1.20)
GFR: 68.44 mL/min (ref >60.00)
Glucose, Bld: 171 mg/dL — ABNORMAL HIGH (ref 70–99)
Potassium: 4.1 mEq/L (ref 3.5–5.1)
Sodium: 138 mEq/L (ref 135–145)

## 2019-02-24 LAB — SEDIMENTATION RATE: Sed Rate: 22 mm/hr (ref 0–30)

## 2019-02-24 LAB — C-REACTIVE PROTEIN: CRP: <1 mg/dL (ref 0.5–20.0)

## 2019-02-24 NOTE — Progress Notes (Addendum)
Patient here for nurse visit BP check per order from Dr. Mclean-Scocuzza   Patient reports compliance with prescribed BP medications: yes  Last dose of BP medication: today   BP Readings from Last 3 Encounters:  02/24/19 140/70  02/19/19 126/63  12/24/18 105/60   Pulse Readings from Last 3 Encounters:  02/24/19 70  02/19/19 92  12/24/18 85    Per Dr. Mclean-Scocuzza  Off losartan 100 mg qd due to h/o angioedema and urticaria will monitor BP for now  appt ob/gyn upcoming will review vitals at that time Cont norvasc 5 mg qd, toprol xl 25 mg qd, spironolactone 12.5 mg qd  Allergic to ACEI/ARB  Patient verbalized understanding of instructions.   Gordy Councilman, CMA   Dr. Gayland Curry

## 2019-02-24 NOTE — Telephone Encounter (Signed)
Called and gave pt instructions given to me by provider and scheduled a nurse visit and bp check for today.  Nina,cma

## 2019-02-25 ENCOUNTER — Other Ambulatory Visit: Payer: Self-pay | Admitting: Internal Medicine

## 2019-02-25 DIAGNOSIS — T783XXD Angioneurotic edema, subsequent encounter: Secondary | ICD-10-CM

## 2019-02-25 LAB — SODIUM, URINE, RANDOM: Sodium, Ur: 96 mmol/L (ref 28–272)

## 2019-02-25 LAB — ANA: Anti Nuclear Antibody (ANA): NEGATIVE

## 2019-02-25 LAB — MICROALBUMIN / CREATININE URINE RATIO
Creatinine, Urine: 56 mg/dL (ref 20–275)
Microalb, Ur: <0.2 mg/dL

## 2019-02-25 LAB — C4 COMPLEMENT: C4 Complement: 40 mg/dL (ref 15–57)

## 2019-03-01 ENCOUNTER — Other Ambulatory Visit: Payer: Self-pay

## 2019-03-01 ENCOUNTER — Encounter: Payer: Self-pay | Admitting: Obstetrics and Gynecology

## 2019-03-01 ENCOUNTER — Ambulatory Visit (INDEPENDENT_AMBULATORY_CARE_PROVIDER_SITE_OTHER): Payer: Medicare HMO | Admitting: Obstetrics and Gynecology

## 2019-03-01 DIAGNOSIS — N393 Stress incontinence (female) (male): Secondary | ICD-10-CM

## 2019-03-01 NOTE — Progress Notes (Signed)
I connected with Heather Sweeney on 03/01/19 at  8:10 AM EDT by telephone and verified that I am speaking with the correct person using two identifiers.   I discussed the limitations, risks, security and privacy concerns of performing an evaluation and management service by telephone and the availability of in person appointments. I also discussed with the patient that there may be a patient responsible charge related to this service. The patient expressed understanding and agreed to proceed.  The patient was at home I spoke with the patient from my workstation phone The names of people involved in this encounter were: Eller Harden Mo , and [facility] Visit   Chief Complaint:  Chief Complaint  Patient presents with  . Follow-up    Urinary incontinance/possible need for Pessary    History of Present Illness: 53 y.o. [id number] presenting for medication follow up for a diagnosis of mixed incontinence but mostly stress incontinence per patient.  She had previously been placed on vesicare but did not note improvement.  This was discontinued out of concern that it might be worsening her stress incontinence. Since discontinuing her vesicare symptoms have stayed stable.   She reports still having to double void at times.  Symptoms are worse when she has full bladder.  Does have to wear incontinence pads during the day.  Given current COVID-19 pandemic has not seen pelvic floor PT yet (scheduled for later this week).  Review of Systems: Review of Systems  Constitutional: Negative.   Genitourinary: Negative.      Past Medical History:  Past Medical History:  Diagnosis Date  . Allergic rhinitis   . Bipolar disorder ([place])   . Chronic low back pain   . CTS (carpal tunnel syndrome)    hands  . Depression   . Gallstones   . GERD (gastroesophageal reflux disease)   . History of prediabetes   . Hypertension   . Laceration of right foot   .  Onychomycosis   . OSA on CPAP   . Overactive bladder   . PVC's (premature ventricular contractions)   . Seizures ([place])    8th grade    Past Surgical History:  Past Surgical History:  Procedure Laterality Date  . ABDOMINAL HYSTERECTOMY  07/08/2011  . ABDOMINAL HYSTERECTOMY     supracervical in 07/2011   . CARPAL TUNNEL RELEASE     right hand 1996  . CESAREAN SECTION     2012  . CHOLECYSTECTOMY N/A 01/12/2018   Procedure: LAPAROSCOPIC CHOLECYSTECTOMY WITH INTRAOPERATIVE CHOLANGIOGRAM ERAS PATHWAY;  Surgeon: Johnathan Hausen, MD;  Location: [facility];  Service: General;  Laterality: N/A;  . DILATION AND CURETTAGE OF UTERUS     04/2011 benign endometrial polyp squamous metaplasia, inflamm, blood, mucous, benign   . FOOT SURGERY     Dr. Prudence Davidson   . LEEP     2006  . TUBAL LIGATION      Gynecologic History: No LMP recorded. Patient has had a hysterectomy.  Obstetric History: [id number]  Family History:  Family History  Problem Relation Age of Onset  . Cancer Father        esophageal   . Hypertension Father   . Heart failure Mother   . Hypertension Mother   . Early death Mother   . Heart disease Mother        chf  . Breast cancer Sister 84  . Cancer Sister        breast  .  Depression Sister   . Mental illness Sister   . Asthma Son   . Cancer Other        brain cancer    Social History:  Social History   Socioeconomic History  . Marital status: Married    Spouse name: Not on file  . Number of children: Not on file  . Years of education: Not on file  . Highest education level: Not on file  Occupational History  . Not on file  Social Needs  . Financial resource strain: Not on file  . Food insecurity:    Worry: Not on file    Inability: Not on file  . Transportation needs:    Medical: Not on file    Non-medical: Not on file  Tobacco Use  . Smoking status: Never Smoker  . Smokeless tobacco: Current User    Types: Snuff  . Tobacco comment: Is in process of  decreasing amount  Substance and Sexual Activity  . Alcohol use: No    Alcohol/week: 0.0 standard drinks  . Drug use: No  . Sexual activity: Yes    Comment: men  Lifestyle  . Physical activity:    Days per week: Not on file    Minutes per session: Not on file  . Stress: Not on file  Relationships  . Social connections:    Talks on phone: Not on file    Gets together: Not on file    Attends religious service: Not on file    Active member of club or organization: Not on file    Attends meetings of clubs or organizations: Not on file    Relationship status: Not on file  . Intimate partner violence:    Fear of current or ex partner: Not on file    Emotionally abused: Not on file    Physically abused: Not on file    Forced sexual activity: Not on file  Other Topics Concern  . Not on file  Social History Narrative   Married    No guns    Wears seat belt    Safe in relationship    Never smoker    2 sons Gaspar Bidding ~8 and another son ~26 as of 08/2018     2 sisters    Unemployed        Allergies:  Allergies  Allergen Reactions  . Cephalexin Swelling and Rash  . Hctz [Hydrochlorothiazide] Rash and Swelling    Mild she says  . Influenza Vaccines Anaphylaxis    Angioedema led to hospitalization 07/2017  . Penicillins Rash    Has patient had a PCN reaction causing immediate rash, facial/tongue/throat swelling, SOB or lightheadedness with hypotension: Yes Has patient had a PCN reaction causing severe rash involving mucus membranes or skin necrosis: No Has patient had a PCN reaction that required hospitalization: No Has patient had a PCN reaction occurring within the last 10 years: No If all of the above answers are "NO", then may proceed with Cephalosporin use.   . Bee Venom     Swelling   . Losartan Hives and Swelling    Angioedema  . Metformin And Related     Rash   . Lisinopril Hives and Other (See Comments)    Headache, Low BP, angioedema      Medications: Prior  to Admission medications   Medication Sig Start Date End Date Taking? Authorizing Provider  amLODipine (NORVASC) 5 MG tablet Take 1 tablet (5 mg total) by mouth daily. 02/19/19 02/19/20  Yes Merlyn Lot, MD  aspirin 81 MG EC tablet Take 1 tablet (81 mg total) by mouth daily. 12/24/18  Yes McLean-Scocuzza, Nino Glow, MD  benztropine (COGENTIN) 1 MG tablet Take 1 mg by mouth daily as needed (anxiety).    Yes [provider]  camphor-menthol Timoteo Ace) lotion Apply 1 application topically as needed for itching. 02/17/19  Yes McLean-Scocuzza, Nino Glow, MD  cetirizine (ZYRTEC) 10 MG tablet Take 1 tablet (10 mg total) by mouth daily. 12/24/18  Yes McLean-Scocuzza, Nino Glow, MD  EPINEPHrine (EPIPEN 2-PAK) 0.3 mg/0.3 mL IJ SOAJ injection Inject 0.3 mLs (0.3 mg total) into the muscle daily as needed (allergic reactions). 12/24/18  Yes McLean-Scocuzza, Nino Glow, MD  EPINEPHrine 0.3 mg/0.3 mL IJ SOAJ injection Inject 0.3 mLs (0.3 mg total) into the muscle as needed for anaphylaxis. 02/19/19  Yes Merlyn Lot, MD  FLUoxetine (PROZAC) 20 MG capsule Take 20 mg by mouth daily.   Yes [provider]  fluPHENAZine (PROLIXIN) 2.5 MG tablet Take 2.5 mg by mouth at bedtime.    Yes [provider]  fluticasone (FLONASE) 50 MCG/ACT nasal spray Place 2 sprays into both nostrils daily. 08/31/18  Yes McLean-Scocuzza, Nino Glow, MD  gabapentin (NEURONTIN) 300 MG capsule Take 1 capsule (300 mg total) by mouth 3 (three) times daily. 12/24/18  Yes McLean-Scocuzza, Nino Glow, MD  hydrocortisone 2.5 % cream Apply topically 2 (two) times daily. Face for rash 02/22/19  Yes McLean-Scocuzza, Nino Glow, MD  hydrOXYzine (ATARAX/VISTARIL) 25 MG tablet Take 1 tablet (25 mg total) by mouth every 8 (eight) hours as needed for itching. Or itchy rash do not take with zyrtec at the same time 02/23/19  Yes McLean-Scocuzza, Nino Glow, MD  meloxicam (MOBIC) 15 MG tablet meloxicam 15 mg tablet  Take 1 tablet(s) every day by oral route  with meals.   Yes [provider]  metoprolol succinate (TOPROL-XL) 25 MG 24 hr tablet Take 1 tablet (25 mg total) by mouth daily. Take with or immediately following a meal. 12/24/18  Yes McLean-Scocuzza, Nino Glow, MD  pantoprazole (PROTONIX) 40 MG tablet Take 1 tablet (40 mg total) by mouth daily. 12/24/18  Yes McLean-Scocuzza, Nino Glow, MD  polyethylene glycol (MIRALAX / GLYCOLAX) packet Take 17 g by mouth daily as needed for mild constipation.   Yes [provider]  pravastatin (PRAVACHOL) 10 MG tablet Take 1 tablet (10 mg total) by mouth daily. 12/24/18  Yes McLean-Scocuzza, Nino Glow, MD  predniSONE (DELTASONE) 10 MG tablet Take 1 tablet (10 mg total) by mouth daily. Day 1-2: Take 50 mg  ( 5 pills) Day 3-4 : Take 40 mg (4pills) Day 5-6: Take 30 mg (3 pills) Day 7-8:  Take 20 mg (2 pills) Day 9:  Take 10mg  (1 pill) 02/19/19  Yes Merlyn Lot, MD  spironolactone (ALDACTONE) 25 MG tablet Take 0.5 tablets (12.5 mg total) by mouth daily. 12/24/18  Yes McLean-Scocuzza, Nino Glow, MD  triamcinolone cream (KENALOG) 0.1 % Apply 1 application topically 2 (two) times daily. As needed to bumps on skin not on face 02/17/19  Yes McLean-Scocuzza, Nino Glow, MD  oxyCODONE-acetaminophen (PERCOCET) 10-325 MG tablet Take 1 tablet by mouth every 6 (six) hours as needed for pain. Patient taking differently: Take 1 tablet by mouth every 8 (eight) hours as needed for pain (Dr. Mirna Mires pain).  01/12/18   Johnathan Hausen, MD    Physical Exam Vitals: There were no vitals filed for this visit. No LMP recorded. Patient has had a hysterectomy.  No physical exam as this was a remote telephone visit to promote social distancing during the current COVID-19 Pandemic  Assessment: 53 y.o. [id number] with stress incontinence  Plan: Problem List Items Addressed This Visit    None    Visit Diagnoses    SUI (stress urinary incontinence, female)    -  Primary     1) SUI - has pelvic floor PT appointment 03/01/2019.   Could trial duloxetine off label for SUI symptoms depending on response.  Still awaiting records from [facility] 2012 hysterectomy.  It is unclear if the patient has a bladder injury that was repaired intraoperative or some soot of incontinence procedure concurrently based on her account.  A trial of incontinence pessary may also be reasonable to consider.    2) Telephone Time: 8:55  3) Return in about 3 months (around 06/01/2019) for follow up.    Malachy Mood, MD, [facility], [facility] 03/01/2019, 8:41 AM

## 2019-03-02 ENCOUNTER — Other Ambulatory Visit: Payer: Self-pay | Admitting: Internal Medicine

## 2019-03-03 ENCOUNTER — Ambulatory Visit: Payer: Medicare HMO | Attending: Obstetrics and Gynecology

## 2019-03-03 ENCOUNTER — Other Ambulatory Visit: Payer: Self-pay

## 2019-03-03 DIAGNOSIS — R278 Other lack of coordination: Secondary | ICD-10-CM | POA: Insufficient documentation

## 2019-03-03 DIAGNOSIS — M6281 Muscle weakness (generalized): Secondary | ICD-10-CM | POA: Insufficient documentation

## 2019-03-03 DIAGNOSIS — M62838 Other muscle spasm: Secondary | ICD-10-CM | POA: Diagnosis not present

## 2019-03-03 DIAGNOSIS — R293 Abnormal posture: Secondary | ICD-10-CM | POA: Insufficient documentation

## 2019-03-03 NOTE — Therapy (Signed)
[place] [facility]IN [facility] [address] [place], [place], [address] Phone: [phone]   Fax:  [phone]  Physical Therapy Evaluation  The patient has been informed of current processes in place at Outpatient Rehab to protect patients from Covid-19 exposure including social distancing, schedule modifications, and new cleaning procedures. After discussing their particular risk with a therapist based on the patient's personal risk factors, the patient has decided to proceed with in-person therapy.  Patient Details  Name: Heather Sweeney MRN: [id number] Date of Birth: 01-Oct-1966 Referring Provider (PT): Malachy Mood   Encounter Date: 03/03/2019  PT End of Session - 03/03/19 1652    Visit Number  1    Number of Visits  10    Date for PT Re-Evaluation  05/12/19    Authorization Type  [employer]/M[employer]    Authorization - Visit Number  1    Authorization - Number of Visits  10    PT Start Time  1324    PT Stop Time  4010    PT Time Calculation (min)  60 min    Activity Tolerance  Patient tolerated treatment well;No increased pain    Behavior During Therapy  WFL for tasks assessed/performed   Pt. needs repetition and easily understood vocabulary      Past Medical History:  Diagnosis Date  . Allergic rhinitis   . Bipolar disorder ([place])   . Chronic low back pain   . CTS (carpal tunnel syndrome)    hands  . Depression   . Gallstones   . GERD (gastroesophageal reflux disease)   . History of prediabetes   . Hypertension   . Laceration of right foot   . Onychomycosis   . OSA on CPAP   . Overactive bladder   . PVC's (premature ventricular contractions)   . Seizures ([place])    8th grade    Past Surgical History:  Procedure Laterality Date  . ABDOMINAL HYSTERECTOMY  07/08/2011  . ABDOMINAL HYSTERECTOMY     supracervical in 07/2011   . CARPAL TUNNEL RELEASE     right hand 1996  . CESAREAN SECTION     2012  . CHOLECYSTECTOMY  N/A 01/12/2018   Procedure: LAPAROSCOPIC CHOLECYSTECTOMY WITH INTRAOPERATIVE CHOLANGIOGRAM ERAS PATHWAY;  Surgeon: Johnathan Hausen, MD;  Location: [facility];  Service: General;  Laterality: N/A;  . DILATION AND CURETTAGE OF UTERUS     04/2011 benign endometrial polyp squamous metaplasia, inflamm, blood, mucous, benign   . FOOT SURGERY     Dr. Prudence Davidson   . LEEP     2006  . TUBAL LIGATION      There were no vitals filed for this visit.           Pelvic Floor Physical Therapy Evaluation and Assessment  SCREENING  Falls in last 6 mo: no     Red Flags:  Have you had any night sweats? no Unexplained weight loss? no Saddle anesthesia? no Unexplained changes in bowel or bladder habits? no  SUBJECTIVE  Patient reports: Started having problems  When she had her hysterectomy, SUI with coughing/sneezing, etc. Constipation started about two years ago.  Precautions:  Seizures, Depression, bipolar, morbid obesity.  Social/Family/Vocational History:   Not working  Recent Procedures/Tests/Findings:  Cholecystectomy in 2019  Obstetrical History: 2 c-sections  Gynecological History: Hysterectomy  Urinary History: SUI  Gastrointestinal History: Constipation  Sexual activity/pain: no  Location of pain: LBP  Current pain:  0/10  Max pain:  9.5/10 Least pain:  0/10 Nature of pain: sharp, radiating into R LE  Patient Goals: To not have to wear pads anymore for SUI.   OBJECTIVE  Posture/Observations:  Sitting: Anterior pelvic tilt Standing: L Iliac crest high, L foot rotated out, L PSIS high, L knee bent. Anterior pelvic tilt/hyperlordosis.  Prone: R PSIS high, R leg long Supine: R ASIS high, R leg long  Palpation/Segmental Motion/Joint Play: TTP at R sacral border and along entire spine. Referred pain to anterior abdomen with pressure through L3-L5. TTP to B adductors, Lumbar paraspinals, R>L Piriformis, OI and glute min.  Special tests:   Scoliosis: very mild R  thoracic curve Leg-length: L-85.5, R-87cm Supine-to-long-sit: R long in both. Stork: decreased stability on RLE  Range of Motion/Flexibilty:  Spine: Fingers ~ 2 fingers from R knee, 3 fingers from L knee, ~ 25% reduced rotation to the R. Can touch floor with fingertips. Hips:   Strength/MMT: Deferred to follow-up LE MMT  LE MMT Left Right  Hip flex:  (L2) /5 /5  Hip ext: /5 /5  Hip abd: /5 /5  Hip add: /5 /5  Hip IR /5 /5  Hip ER /5 /5     Abdominal:  Palpation: TTP to R>L PSOAS, Iliacus, and adductors. Diastasis: none Scar: arthroscopic scars all TTP and mobile but c-section scar is significantly restricted and refers to the bladder causing urinary urge.  Pelvic Floor External Exam: Deferred due to covid-19 precautions.  Introitus Appears:  Skin integrity:  Palpation: Cough: Prolapse visible?: Scar mobility:  Internal Vaginal Exam: Strength (PERF):  Symmetry: Palpation: Prolapse:   Internal Rectal Exam: Strength (PERF): Symmetry: Palpation: Prolapse:   Gait Analysis: vaulting over R LE.   Pelvic Floor Outcome Measures: PFDI: 200/300, PFIQ: 152/300  INTERVENTIONS THIS SESSION: Self-care: Educated on the structure and function of the pelvic floor in relation to their symptoms as well as the POC, and initial HEP in order to set patient expectations and understanding from which we will build on in the future sessions. Educated the Pt. On how to self-assess to determine if she is coordinating her PFM correctly.  Total time: 60 min.          Objective measurements completed on examination: See above findings.                PT Short Term Goals - 03/04/19 1009      PT SHORT TERM GOAL #1   Title  Patient will demonstrate a coordinated contraction, relaxation, and bulge of the pelvic floor muscles to demonstrate functional recruitment and motion and allow for further strengthening.    Baseline  Pt. history indicates severe PFM  dyssenergia    Time  5    Period  Weeks    Status  New    Target Date  04/08/19      PT SHORT TERM GOAL #2   Title  Patient will demonstrate improved pelvic alignment and balance of musculature surrounding the pelvis to facilitate decreased pressure on nerves innervating the pelvis and allow for improved PFM recruitment and function.    Baseline  Pt. demonstrates RLE 1.5 cm longer and up-slipped. Spasms surrounding R>L hip.     Time  5    Period  Weeks    Status  New    Target Date  04/08/19      PT SHORT TERM GOAL #3   Title  Patient will demonstrate improved sitting and standing posture to demonstrate learning and decrease stress on the pelvic floor with functional  activity.    Baseline  Anterior pelvic tilt/hyperlordosis.    Time  5    Period  Weeks    Status  New    Target Date  04/08/19        PT Long Term Goals - 03/04/19 1012      PT LONG TERM GOAL #1   Title  Patient will report no episodes of SUI or FI over the course of the prior two weeks to demonstrate improved functional ability.    Baseline  Having SUI and FI daily, wearing "double pads"    Time  10    Period  Weeks    Status  New    Target Date  05/13/19      PT LONG TERM GOAL #2   Title  Patient will score at or below 110/300 on the PFDI and 62/300 on the PFIQ to demonstrate a clinically meaningful decrease in disability and distress due to pelvic floor dysfunction.    Baseline  PFDI: 200/300, PFIQ: 152/300    Time  10    Period  Weeks    Status  New    Target Date  05/13/19      PT LONG TERM GOAL #3   Title  Patient will describe pain no greater than 2/10 during standing, walking, bending, and sleeping to demonstrate improved functional ability.    Baseline  Pain can get as high as 9.5/10    Time  10    Period  Weeks    Status  New    Target Date  05/13/19      PT LONG TERM GOAL #4   Title  Patient will report urinating 6-8 times per day over the course of the prior week to demonstrate decreased  frequency.    Baseline  Feeling of incomplete emptying of bladder and bowels leading to frequency every ~1 hour.    Time  10    Period  Weeks    Status  New    Target Date  05/13/19      PT LONG TERM GOAL #5   Title  Patient will report having BM's at least every-other day with consistency between [facility] stool scale 3-5 over the prior week to demonstrate decreased constipation.    Baseline  Having to strain to empty bowel or bladder, 2 year Hx. of constipation    Time  10    Period  Weeks    Status  New    Target Date  05/13/19             Plan - 03/04/19 0950    Clinical Impression Statement  Pt. is a 53 y/o female who presents today with cheif c/o SUI as well as FI, urinary frequency, constipation, urinary and fecal urgency, and feeling of POP, and LBP. Her PMH is significant for 2 c-sections followed by a D&C and a hysterectomy in 2012 and a recent galbladder removal as well as morbid obesity, Bipolar disorder, Chronic LBP, and depression. Her clinical exam revealed significant restriction of abdominal scar tissue that re-creates symptoms with palpation as well as a leg-length difference of ~ 1.5 cm with the RLE long that has forced poor pelvic and spinal alignment and is contributing to pelvic floor dyssynergia as well as LBP. She will benefit from skilled pelvic PT to address the noted defecits and to continue to assess for and address other potential causes of pelvic floor symptoms and LBP.    Personal Factors and Comorbidities  Comorbidity  3+;Social Background    Comorbidities  Morbid obesity, bipolar disorder, depression, Chronic LBP, OSA    Examination-Activity Limitations  Continence;Toileting;Locomotion Level;Squat;Lift    Examination-Participation Restrictions  Church;Community Activity;Cleaning    Stability/Clinical Decision Making  Unstable/Unpredictable    Clinical Decision Making  High    Rehab Potential  Good    PT Frequency  1x / week    PT Duration  Other  (comment)   10 weeks   PT Treatment/Interventions  ADLs/Self Care Home Management;Biofeedback;Traction;Moist Heat;Electrical Stimulation;Gait training;Functional mobility training;Neuromuscular re-education;Balance training;Therapeutic exercise;Therapeutic activities;Patient/family education;Manual techniques;Dry needling;Scar mobilization;Taping;Spinal Manipulations;Joint Manipulations    PT Next Visit Plan  Give L heel-lift and perform R up-slip correction/TP release surrounding pelvis. Educate on squatty potty, sit-to-stand and soda can, increasing heel-lift wear, and seated pelvic tilts with TA.    Consulted and Agree with Plan of Care  Patient       Patient will benefit from skilled therapeutic intervention in order to improve the following deficits and impairments:  Abnormal gait, Increased fascial restricitons, Improper body mechanics, Pain, Decreased coordination, Increased muscle spasms, Postural dysfunction, Decreased activity tolerance, Decreased strength, Decreased balance, Difficulty walking, Obesity  Visit Diagnosis: Muscle weakness (generalized)  Other muscle spasm  Abnormal posture  Other lack of coordination     Problem List Patient Active Problem List   Diagnosis Date Noted  . Hives 02/23/2019  . Vitamin D deficiency 02/01/2019  . Mood disorder ([place]) 12/24/2018  . HLD (hyperlipidemia) 09/01/2018  . History of prediabetes 09/01/2018  . Morbid obesity ([place]) 09/01/2018  . Chronic pain 08/31/2018  . Allergic rhinitis 08/31/2018  . S/P laparoscopic cholecystectomy April 2019 01/12/2018  . Angioedema 07/07/2017  . Bilateral carpal tunnel syndrome 11/03/2016  . Frequent PVCs 09/24/2016  . Morbid obesity with BMI of 40.0-44.9, adult ([place]) 08/07/2016  . Essential hypertension 07/10/2016  . OSA on CPAP 07/10/2016   Heather Sweeney DPT, ATC Heather Sweeney 03/04/2019, 10:21 AM  [place] [facility] SERVICES [address]  [place], [place], [address] Phone: [phone]   Fax:  [phone]  Name: Heather Sweeney MRN: [id number] Date of Birth: December 05, 1965

## 2019-03-07 ENCOUNTER — Telehealth: Payer: Self-pay

## 2019-03-07 NOTE — Telephone Encounter (Signed)
Unable to contact patient in regards to inform her that her 06/08 Colonoscopy has been canceled and we will need to reschedule.  Sent patient an email at the email listed in her demographics to contact office to reschedule.  Thanks  [employer]

## 2019-03-09 ENCOUNTER — Inpatient Hospital Stay: Admission: RE | Admit: 2019-03-09 | Payer: Medicare HMO | Source: Ambulatory Visit

## 2019-03-10 ENCOUNTER — Ambulatory Visit: Payer: Medicare HMO | Attending: Obstetrics and Gynecology

## 2019-03-10 ENCOUNTER — Other Ambulatory Visit: Payer: Self-pay

## 2019-03-10 DIAGNOSIS — M6281 Muscle weakness (generalized): Secondary | ICD-10-CM | POA: Insufficient documentation

## 2019-03-10 DIAGNOSIS — M549 Dorsalgia, unspecified: Secondary | ICD-10-CM | POA: Diagnosis not present

## 2019-03-10 DIAGNOSIS — R278 Other lack of coordination: Secondary | ICD-10-CM | POA: Insufficient documentation

## 2019-03-10 DIAGNOSIS — R293 Abnormal posture: Secondary | ICD-10-CM | POA: Diagnosis not present

## 2019-03-10 DIAGNOSIS — M62838 Other muscle spasm: Secondary | ICD-10-CM | POA: Diagnosis not present

## 2019-03-10 DIAGNOSIS — Z7984 Long term (current) use of oral hypoglycemic drugs: Secondary | ICD-10-CM | POA: Diagnosis not present

## 2019-03-10 DIAGNOSIS — E119 Type 2 diabetes mellitus without complications: Secondary | ICD-10-CM | POA: Diagnosis not present

## 2019-03-10 DIAGNOSIS — G8929 Other chronic pain: Secondary | ICD-10-CM | POA: Diagnosis not present

## 2019-03-10 DIAGNOSIS — I25118 Atherosclerotic heart disease of native coronary artery with other forms of angina pectoris: Secondary | ICD-10-CM | POA: Diagnosis not present

## 2019-03-10 DIAGNOSIS — F1120 Opioid dependence, uncomplicated: Secondary | ICD-10-CM | POA: Diagnosis not present

## 2019-03-10 NOTE — Therapy (Signed)
[place] MAIN [facility] SERVICES [address] [place], [place], [address] Phone: [phone]   Fax:  [phone]  Physical Therapy Treatment  The patient has been informed of current processes in place at Outpatient Rehab to protect patients from Covid-19 exposure including social distancing, schedule modifications, and new cleaning procedures. After discussing their particular risk with a therapist based on the patient's personal risk factors, the patient has decided to proceed with in-person therapy.   Patient Details  Name: Heather Sweeney MRN: [id number] Date of Birth: Mar 22, 1966 Referring Provider (PT): Malachy Mood   Encounter Date: 03/10/2019  PT End of Session - 03/10/19 1753    Visit Number  2    Number of Visits  10    Date for PT Re-Evaluation  05/12/19    Authorization Type  [employer]/MCAID    Authorization - Visit Number  2    Authorization - Number of Visits  10    PT Start Time  1640    PT Stop Time  8546    PT Time Calculation (min)  60 min    Activity Tolerance  Patient tolerated treatment well;No increased pain    Behavior During Therapy  WFL for tasks assessed/performed   Pt. needs repetition and easily understood vocabulary      Past Medical History:  Diagnosis Date  . Allergic rhinitis   . Bipolar disorder ([place])   . Chronic low back pain   . CTS (carpal tunnel syndrome)    hands  . Depression   . Gallstones   . GERD (gastroesophageal reflux disease)   . History of prediabetes   . Hypertension   . Laceration of right foot   . Onychomycosis   . OSA on CPAP   . Overactive bladder   . PVC's (premature ventricular contractions)   . Seizures ([place])    8th grade    Past Surgical History:  Procedure Laterality Date  . ABDOMINAL HYSTERECTOMY  07/08/2011  . ABDOMINAL HYSTERECTOMY     supracervical in 07/2011   . CARPAL TUNNEL RELEASE     right hand 1996  . CESAREAN SECTION     2012  . CHOLECYSTECTOMY  N/A 01/12/2018   Procedure: LAPAROSCOPIC CHOLECYSTECTOMY WITH INTRAOPERATIVE CHOLANGIOGRAM ERAS PATHWAY;  Surgeon: Johnathan Hausen, MD;  Location: [facility];  Service: General;  Laterality: N/A;  . DILATION AND CURETTAGE OF UTERUS     04/2011 benign endometrial polyp squamous metaplasia, inflamm, blood, mucous, benign   . FOOT SURGERY     Dr. Prudence Davidson   . LEEP     2006  . TUBAL LIGATION      There were no vitals filed for this visit.    Pelvic Floor Physical Therapy Treatment Note  SCREENING  Changes in medications, allergies, or medical history?: no    SUBJECTIVE  Patient reports: Has not noticed any change yet.  Precautions:  Seizures, Depression, bipolar, morbid obesity.  Pain update:  Location of pain: L hip and leg Current pain:  0/10  Max pain:  8/10 Least pain:  0/10 Nature of pain: sharp  * no pain after session.  Patient Goals: To not have to wear pads anymore for SUI.   OBJECTIVE  Changes in: Posture/Observations:  R PSIS high in standing pre-treatment and level post-treatment with addition of heel-lift.  Palpation: TTP to R Iliacus, Psoas, Piriformis, and adductor brevis and pectineus.  INTERVENTIONS THIS SESSION: Manual: Performed TP release to R Iliacus, Psoas, Piriformis, and adductor brevis and pectineus  followed by PA mobs to L sacral base to allow for the SIJ to be mobile enough to accept a heel-lift without increasing pain. THerex: Educated on and practiced supine adductor stretch, hip flexor stretch in kneeling, and seated low back stretch to improve/maintain improved length of muscles surrounding the pelvis to decrease pressure on the nerves that innervate the PFM and allow decreased LBP and SUI.  Total time: 60 min.                           PT Short Term Goals - 03/04/19 1009      PT SHORT TERM GOAL #1   Title  Patient will demonstrate a coordinated contraction, relaxation, and bulge of the pelvic floor muscles to  demonstrate functional recruitment and motion and allow for further strengthening.    Baseline  Pt. history indicates severe PFM dyssenergia    Time  5    Period  Weeks    Status  New    Target Date  04/08/19      PT SHORT TERM GOAL #2   Title  Patient will demonstrate improved pelvic alignment and balance of musculature surrounding the pelvis to facilitate decreased pressure on nerves innervating the pelvis and allow for improved PFM recruitment and function.    Baseline  Pt. demonstrates RLE 1.5 cm longer and up-slipped. Spasms surrounding R>L hip.     Time  5    Period  Weeks    Status  New    Target Date  04/08/19      PT SHORT TERM GOAL #3   Title  Patient will demonstrate improved sitting and standing posture to demonstrate learning and decrease stress on the pelvic floor with functional activity.    Baseline  Anterior pelvic tilt/hyperlordosis.    Time  5    Period  Weeks    Status  New    Target Date  04/08/19        PT Long Term Goals - 03/04/19 1012      PT LONG TERM GOAL #1   Title  Patient will report no episodes of SUI or FI over the course of the prior two weeks to demonstrate improved functional ability.    Baseline  Having SUI and FI daily, wearing "double pads"    Time  10    Period  Weeks    Status  New    Target Date  05/13/19      PT LONG TERM GOAL #2   Title  Patient will score at or below 110/300 on the PFDI and 62/300 on the PFIQ to demonstrate a clinically meaningful decrease in disability and distress due to pelvic floor dysfunction.    Baseline  PFDI: 200/300, PFIQ: 152/300    Time  10    Period  Weeks    Status  New    Target Date  05/13/19      PT LONG TERM GOAL #3   Title  Patient will describe pain no greater than 2/10 during standing, walking, bending, and sleeping to demonstrate improved functional ability.    Baseline  Pain can get as high as 9.5/10    Time  10    Period  Weeks    Status  New    Target Date  05/13/19      PT LONG  TERM GOAL #4   Title  Patient will report urinating 6-8 times per day over the course of the  prior week to demonstrate decreased frequency.    Baseline  Feeling of incomplete emptying of bladder and bowels leading to frequency every ~1 hour.    Time  10    Period  Weeks    Status  New    Target Date  05/13/19      PT LONG TERM GOAL #5   Title  Patient will report having BM's at least every-other day with consistency between [facility] stool scale 3-5 over the prior week to demonstrate decreased constipation.    Baseline  Having to strain to empty bowel or bladder, 2 year Hx. of constipation    Time  10    Period  Weeks    Status  New    Target Date  05/13/19            Plan - 03/10/19 1753    Clinical Impression Statement  Pt. responded well to all interventions today, demonstrating significant decrease in spasms surrounding pelvis and level pelvis at end of session as well as understanding of all education provided. Continue per POC.    PT Next Visit Plan  Educate on squatty potty, sit-to-stand and soda can, seated pelvic tilts with TA and strengthening for hip ABD and glutes    Consulted and Agree with Plan of Care  Patient       Patient will benefit from skilled therapeutic intervention in order to improve the following deficits and impairments:     Visit Diagnosis: Muscle weakness (generalized)  Other muscle spasm  Abnormal posture  Other lack of coordination     Problem List Patient Active Problem List   Diagnosis Date Noted  . Hives 02/23/2019  . Vitamin D deficiency 02/01/2019  . Mood disorder ([place]) 12/24/2018  . HLD (hyperlipidemia) 09/01/2018  . History of prediabetes 09/01/2018  . Morbid obesity ([place]) 09/01/2018  . Chronic pain 08/31/2018  . Allergic rhinitis 08/31/2018  . S/P laparoscopic cholecystectomy April 2019 01/12/2018  . Angioedema 07/07/2017  . Bilateral carpal tunnel syndrome 11/03/2016  . Frequent PVCs 09/24/2016  . Morbid obesity with BMI  of 40.0-44.9, adult ([place]) 08/07/2016  . Essential hypertension 07/10/2016  . OSA on CPAP 07/10/2016   Willa Rough DPT, ATC Willa Rough 03/10/2019, 5:56 PM  [place] MAIN [facility] SERVICES [address] [place], [place], [address] Phone: [phone]   Fax:  [phone]  Name: Brittish Bolinger MRN: [id number] Date of Birth: 1966/09/14

## 2019-03-10 NOTE — Patient Instructions (Signed)
   1) Wear your heel-lift in your LEFT shoe for 1 hour today, 2 hours tomorrow,  3 hours on Saturday, etc. Increasing by one hour each day until you get up to a full day wear and then wear it all day, every day.   2) Hold for 30 second and then relax then repeat 2 more times. Do this 1 time per day.  3)Flexors, Lunge  Hip Flexor Stretch: Proposal Pose    Maintain pelvic tuck under, lift pubic bone toward navel. Engage posterior hip muscles (firm glute muscles of leg in back position) and shift forward until you feel stretch on front of leg that is down. To increase stretch, maintain balance and ease hips forward. You may use one hand on a chair for balance if needed. Hold for __5__ breaths. Repeat __2-3__ times each leg.  Do _1-2__ times per day.  4)   Relax all the way forward, tucking your hips under just slightly so you feel a stretch through your low back. Hold for 5 deep breaths, repeat 2-3 times, 1-2 times per day.

## 2019-03-14 ENCOUNTER — Ambulatory Visit: Admit: 2019-03-14 | Payer: Medicare HMO | Admitting: Gastroenterology

## 2019-03-14 SURGERY — COLONOSCOPY WITH PROPOFOL
Anesthesia: General

## 2019-03-17 ENCOUNTER — Other Ambulatory Visit: Payer: Self-pay

## 2019-03-17 ENCOUNTER — Ambulatory Visit: Payer: Medicare HMO

## 2019-03-17 DIAGNOSIS — R278 Other lack of coordination: Secondary | ICD-10-CM | POA: Diagnosis not present

## 2019-03-17 DIAGNOSIS — R293 Abnormal posture: Secondary | ICD-10-CM

## 2019-03-17 DIAGNOSIS — M62838 Other muscle spasm: Secondary | ICD-10-CM | POA: Diagnosis not present

## 2019-03-17 DIAGNOSIS — M6281 Muscle weakness (generalized): Secondary | ICD-10-CM

## 2019-03-17 NOTE — Therapy (Signed)
[place] MAIN [facility] SERVICES [address] [place], [place], [address] Phone: [phone]   Fax:  [phone]  Physical Therapy Treatment  The patient has been informed of current processes in place at Outpatient Rehab to protect patients from Covid-19 exposure including social distancing, schedule modifications, and new cleaning procedures. After discussing their particular risk with a therapist based on the patient's personal risk factors, the patient has decided to proceed with in-person therapy.   Patient Details  Name: Heather Sweeney MRN: [id number] Date of Birth: Oct 25, 1965 Referring Provider (PT): Malachy Mood   Encounter Date: 03/17/2019  PT End of Session - 03/18/19 1029    Visit Number  3    Number of Visits  10    Date for PT Re-Evaluation  05/12/19    Authorization Type  Humana/MCAID    Authorization - Visit Number  3    Authorization - Number of Visits  10    PT Start Time  1630    PT Stop Time  1730    PT Time Calculation (min)  60 min    Activity Tolerance  Patient tolerated treatment well;No increased pain    Behavior During Therapy  WFL for tasks assessed/performed   Pt. needs repetition and easily understood vocabulary      Past Medical History:  Diagnosis Date  . Allergic rhinitis   . Bipolar disorder ([place])   . Chronic low back pain   . CTS (carpal tunnel syndrome)    hands  . Depression   . Gallstones   . GERD (gastroesophageal reflux disease)   . History of prediabetes   . Hypertension   . Laceration of right foot   . Onychomycosis   . OSA on CPAP   . Overactive bladder   . PVC's (premature ventricular contractions)   . Seizures ([place])    8th grade    Past Surgical History:  Procedure Laterality Date  . ABDOMINAL HYSTERECTOMY  07/08/2011  . ABDOMINAL HYSTERECTOMY     supracervical in 07/2011   . CARPAL TUNNEL RELEASE     right hand 1996  . CESAREAN SECTION     2012  . CHOLECYSTECTOMY  N/A 01/12/2018   Procedure: LAPAROSCOPIC CHOLECYSTECTOMY WITH INTRAOPERATIVE CHOLANGIOGRAM ERAS PATHWAY;  Surgeon: Johnathan Hausen, MD;  Location: [facility];  Service: General;  Laterality: N/A;  . DILATION AND CURETTAGE OF UTERUS     04/2011 benign endometrial polyp squamous metaplasia, inflamm, blood, mucous, benign   . FOOT SURGERY     Dr. Prudence Davidson   . LEEP     2006  . TUBAL LIGATION      There were no vitals filed for this visit.    Pelvic Floor Physical Therapy Treatment Note  SCREENING  Changes in medications, allergies, or medical history?: no    SUBJECTIVE  Patient reports: No pain in back, has some pain in her R knee since starting to use heel-lift.  Precautions:  Seizures, Depression, bipolar, morbid obesity.  Pain update: Location of pain: R knee Current pain:  0/10  Max pain:  6/10 Least pain:  0/10 Nature of pain: popping  Patient Goals: To not have to wear pads anymore for SUI.   OBJECTIVE  Changes in: Posture/Observations:  With addition of heel-lift, Pt's pelvis appears near-even. Functional testing will be the determination of whether the correction height is appropriate due to body habitus.  Gait Analysis: B genu valgus with loading  INTERVENTIONS THIS SESSION: Therex: Educated on and practiced hip  ABD and EXT to strengthen around the hip and decrease valgus force at the knee that is causing pain and allow for decreased adductor spasms for decreased SUI. Self-care: Educated Pt. On squatty potty, bowel retraining program, and colonic massage to improve bowel regularity and decrease constipation. Educated on heel-lift and arch support wear and hot to attain them to improve pelvic alignment and allow for decreased pain in knees and pressure on lumbosacral nerve roots to allow for improved PFM function and sustained decreased back pain as well as to decrease knee pain.   Total time: 50                            PT Short Term  Goals - 03/04/19 1009      PT SHORT TERM GOAL #1   Title  Patient will demonstrate a coordinated contraction, relaxation, and bulge of the pelvic floor muscles to demonstrate functional recruitment and motion and allow for further strengthening.    Baseline  Pt. history indicates severe PFM dyssenergia    Time  5    Period  Weeks    Status  New    Target Date  04/08/19      PT SHORT TERM GOAL #2   Title  Patient will demonstrate improved pelvic alignment and balance of musculature surrounding the pelvis to facilitate decreased pressure on nerves innervating the pelvis and allow for improved PFM recruitment and function.    Baseline  Pt. demonstrates RLE 1.5 cm longer and up-slipped. Spasms surrounding R>L hip.     Time  5    Period  Weeks    Status  New    Target Date  04/08/19      PT SHORT TERM GOAL #3   Title  Patient will demonstrate improved sitting and standing posture to demonstrate learning and decrease stress on the pelvic floor with functional activity.    Baseline  Anterior pelvic tilt/hyperlordosis.    Time  5    Period  Weeks    Status  New    Target Date  04/08/19        PT Long Term Goals - 03/04/19 1012      PT LONG TERM GOAL #1   Title  Patient will report no episodes of SUI or FI over the course of the prior two weeks to demonstrate improved functional ability.    Baseline  Having SUI and FI daily, wearing "double pads"    Time  10    Period  Weeks    Status  New    Target Date  05/13/19      PT LONG TERM GOAL #2   Title  Patient will score at or below 110/300 on the PFDI and 62/300 on the PFIQ to demonstrate a clinically meaningful decrease in disability and distress due to pelvic floor dysfunction.    Baseline  PFDI: 200/300, PFIQ: 152/300    Time  10    Period  Weeks    Status  New    Target Date  05/13/19      PT LONG TERM GOAL #3   Title  Patient will describe pain no greater than 2/10 during standing, walking, bending, and sleeping to  demonstrate improved functional ability.    Baseline  Pain can get as high as 9.5/10    Time  10    Period  Weeks    Status  New    Target Date  05/13/19      PT LONG TERM GOAL #4   Title  Patient will report urinating 6-8 times per day over the course of the prior week to demonstrate decreased frequency.    Baseline  Feeling of incomplete emptying of bladder and bowels leading to frequency every ~1 hour.    Time  10    Period  Weeks    Status  New    Target Date  05/13/19      PT LONG TERM GOAL #5   Title  Patient will report having BM's at least every-other day with consistency between [facility] stool scale 3-5 over the prior week to demonstrate decreased constipation.    Baseline  Having to strain to empty bowel or bladder, 2 year Hx. of constipation    Time  10    Period  Weeks    Status  New    Target Date  05/13/19            Plan - 03/18/19 1030    Clinical Impression Statement  Pt. responded well to all interventions today, demonstrating improved pelvic alignment, understanding and appropriate response to all education provided. continue per POC.    PT Next Visit Plan  seated pelvic tilts, child's pose, frog pose stretch, assess PFM and give kegels/anti-kegels    PT Home Exercise Plan  hip ABD and EXT, bowel retraining, squatty potty, water intake,    Consulted and Agree with Plan of Care  Patient       Patient will benefit from skilled therapeutic intervention in order to improve the following deficits and impairments:     Visit Diagnosis: Muscle weakness (generalized)   Other muscle spasm  Abnormal posture   Other lack of coordination      Problem List Patient Active Problem List   Diagnosis Date Noted  . Hives 02/23/2019  . Vitamin D deficiency 02/01/2019  . Mood disorder ([place]) 12/24/2018  . HLD (hyperlipidemia) 09/01/2018  . History of prediabetes 09/01/2018  . Morbid obesity ([place]) 09/01/2018  . Chronic pain 08/31/2018  . Allergic rhinitis  08/31/2018  . S/P laparoscopic cholecystectomy April 2019 01/12/2018  . Angioedema 07/07/2017  . Bilateral carpal tunnel syndrome 11/03/2016  . Frequent PVCs 09/24/2016  . Morbid obesity with BMI of 40.0-44.9, adult ([place]) 08/07/2016  . Essential hypertension 07/10/2016  . OSA on CPAP 07/10/2016   Willa Rough DPT, ATC Willa Rough 03/18/2019, 10:37 AM  [place] MAIN [facility] SERVICES [address] [place], [place], [address] Phone: [phone]   Fax:  [phone]  Name: Malka Bocek MRN: [id number] Date of Birth: 1966/03/02

## 2019-03-17 NOTE — Patient Instructions (Signed)
Blow out when you lift, stand, push, pull etc.    Sit, feet flat, scoot forward to the edge of the chair. Inhale as you bend forward at hips, begin to exhale just before and while you stand, contracting the glutes, lower tummy muscles and pelvic floor as if stopping urination as you stand up.   * Do this every time you sit or stand! If you catch yourself doing it "wrong, re-set and do it again so it can become habit!    Keep your trunk as one unit and let it hinge forward from the hips as you push your bottom back and bend your knees at the same rate that you bend your hips. Keep your weight back toward your heels but do not actually lift the toes off the ground. Exhale starting just before and all the way through standing to help engage the glutes and lower tummy muscles.     Bowel retraining program: 1) Start by drinking a hot (optionally caffeinated) beverage 2) Do your "I love you" colonic massage  3) Go for a short walk 4) Go to the toilet and sit with feet up on squatty potty and relaxing forward on your knees with tall spine. Take deep, lengthening, breaths and allow up to 10 minutes to have a BM without "straining" before you move on with the day.    The "I Love You" massage for your colon  Start by resting or lying quietly. 1. Using your fingertips, you apply light pressure in a stroking motion. 2. Start with your hands on the left hand side of your abdomen, below the rib cage, and stroke or make small circles down towards your left hip. This is the "I" of the "I Love You" massage. 3. Next, you are going to make the strokes in an upside down "L" shape. Run your fingertips from the right side of your upper abdomen, across under your ribs, and down the left side. 4. Now you are going to run through the whole path. This is the "U". Start on the bottom right of your abdomen. Stroke up the right side, across under the rib cage, and down the left side.   5. Finally, Using your  fingertips, you apply light pressure in small circles  through the whole "U" path to "wake up" the smooth muscles of the intestines and get things moving.    Up the right, across under the rib cage, down the left and inwards, moving in a clockwise motion (if you are looking down upon your own abdomen)  Essentially you are massaging along the path of your large intestine. Our colon starts roughly in the bottom right of our abdomen, travels up the right hand side, turns and runs across below our rib cage, and then down the left side and in towards the pubic bone. When I teach this massage for people to do at home I have them start with 10 minutes. However, anecdotally, many people tell me 15-20 minutes really gets things going! After about 5 minutes of this massage my insides start gurgling and making noises. 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 A big problem is constipation resulting from either medication side effects, post surgical changes, or the fact that in general people in the hospital don't move as much (and exercise such as walking also helps regulate our digestive system). One of the first "exercises" I would teach them is how to do the "I Love You" abdominal  massage. Time and time again I have people come back to me and say that massaging their abdominal tissue helped their digestive issues. Give it a try today!  *Adapted from article written by Gwenlyn Perking, PT, DPT    Flush your hot water heater with hoses (look on youtube for instructions) get a water filter (britta)   You need at least 64 oz of water per day, try getting a big water bottle (32oz) and having at least 2 throughout the day or look for a by the hour water bottle.     Better than nothing, good for "pretty shoes"  ~ $8 better than the gel ones    Best, have a firm (hard plastic not gel) arch  Hip Abduction (Standing)    Stand with support. Squeeze pelvic floor and hold. Lift  right leg out to side, keeping toe forward. Hold for _2__ seconds. Relax for _1__ seconds. Repeat __10_ times. Do _3__ sets  a day.  HIP Extension - Standing    Draw lower tummy muscle (TA) and pelvic floor in, raise and lift leg backward squeezing glutes. Keep knee straight or slightly bent. _10__ reps per set, _3__ sets per day. Hold onto a support as lightly as safely possible.   (watch video)

## 2019-03-24 ENCOUNTER — Other Ambulatory Visit: Payer: Self-pay

## 2019-03-24 ENCOUNTER — Ambulatory Visit: Payer: Medicare HMO

## 2019-03-24 DIAGNOSIS — M62838 Other muscle spasm: Secondary | ICD-10-CM | POA: Diagnosis not present

## 2019-03-24 DIAGNOSIS — M6281 Muscle weakness (generalized): Secondary | ICD-10-CM

## 2019-03-24 DIAGNOSIS — R293 Abnormal posture: Secondary | ICD-10-CM | POA: Diagnosis not present

## 2019-03-24 DIAGNOSIS — R278 Other lack of coordination: Secondary | ICD-10-CM | POA: Diagnosis not present

## 2019-03-24 NOTE — Patient Instructions (Addendum)
   Make sure that you are still wearing the heel-lift even when you have the other inserts in. You do not have to wear the other insert in the Left shoe if it is too much, but always have the heel-lift.   Flexors, Lunge  Hip Flexor Stretch: Proposal Pose    Maintain pelvic tuck under, lift pubic bone toward navel. Engage posterior hip muscles (firm glute muscles of leg in back position) and shift forward until you feel stretch on front of leg that is down. To increase stretch, maintain balance and ease hips forward. You may use one hand on a chair for balance if needed. Hold for __5__ breaths. Repeat __2-3__ times each leg.  Do _1-3__ times per day.    Relax all the way forward, tucking your hips under just slightly so you feel a stretch through your low back. Hold for 5 deep breaths, repeat 2-3 times, 1-3 times per day.  Stabilization: Diaphragmatic Breathing    Lie with knees bent, feet flat. Place one hand on stomach, other on chest. Breathe deeply through nose, lifting belly hand without any motion of hand on chest.  3-Way Wall Stretches for Pelvic Floor Lengthening   Bring bottom close to the wall and gently press straighten knees to feel a stretch down the back of you thighs. Hold while taking 5 deep belly breaths and feeling the pelvic floor relax and lower on each inhale.    Let your legs fall to the side to feel a stretch on the inside of your thighs. If the stretch is too intense you can use a pillow to take some of the weight off by wedging it on the outside of your hips. Hold while taking 5 deep belly breaths and feeling the pelvic floor relax and lower on each inhale.    Slide feet down the wall and move hips slightly away from the wall and then let your knees fall to the sides so you feel a stretch on the inside of the thighs near your groin. Hold while taking 5 deep belly breaths and feeling the pelvic floor relax and lower on each inhale.     *Perform each stretch  in sequence 3 times, once a day   Cat / Cow Flow    Exhale, press spine toward ceiling like a Halloween cat. Keeping strength in arms and abdominals, inhale to soften spine through neutral and into cow pose. Open chest and arch back. Initiate movement between cat and cow at tailbone, one vertebrae at a time. Repeat __30__ times.

## 2019-03-24 NOTE — Therapy (Signed)
[place] MAIN [facility] SERVICES [address] [place], [place], [address] Phone: [phone]   Fax:  [phone]  Physical Therapy Treatment The patient has been informed of current processes in place at Outpatient Rehab to protect patients from Covid-19 exposure including social distancing, schedule modifications, and new cleaning procedures. After discussing their particular risk with a therapist based on the patient's personal risk factors, the patient has decided to proceed with in-person therapy.    Patient Details  Name: Heather Sweeney MRN: [id number] Date of Birth: 1966/04/30 Referring Provider (PT): Malachy Mood   Encounter Date: 03/24/2019  PT End of Session - 03/25/19 1048    Visit Number  4    Number of Visits  10    Date for PT Re-Evaluation  05/12/19    Authorization Type  [employer]/MCA[employer]    Authorization - Visit Number  4    Authorization - Number of Visits  10    PT Start Time  6546    PT Stop Time  5035    PT Time Calculation (min)  60 min    Activity Tolerance  Patient tolerated treatment well;No increased pain    Behavior During Therapy  WFL for tasks assessed/performed   Pt. needs repetition and easily understood vocabulary      Past Medical History:  Diagnosis Date  . Allergic rhinitis   . Bipolar disorder ([place])   . Chronic low back pain   . CTS (carpal tunnel syndrome)    hands  . Depression   . Gallstones   . GERD (gastroesophageal reflux disease)   . History of prediabetes   . Hypertension   . Laceration of right foot   . Onychomycosis   . OSA on CPAP   . Overactive bladder   . PVC's (premature ventricular contractions)   . Seizures ([place])    8th grade    Past Surgical History:  Procedure Laterality Date  . ABDOMINAL HYSTERECTOMY  07/08/2011  . ABDOMINAL HYSTERECTOMY     supracervical in 07/2011   . CARPAL TUNNEL RELEASE     right hand 1996  . CESAREAN SECTION     2012  . CHOLECYSTECTOMY  N/A 01/12/2018   Procedure: LAPAROSCOPIC CHOLECYSTECTOMY WITH INTRAOPERATIVE CHOLANGIOGRAM ERAS PATHWAY;  Surgeon: Johnathan Hausen, MD;  Location: [facility];  Service: General;  Laterality: N/A;  . DILATION AND CURETTAGE OF UTERUS     04/2011 benign endometrial polyp squamous metaplasia, inflamm, blood, mucous, benign   . FOOT SURGERY     Dr. Prudence Davidson   . LEEP     2006  . TUBAL LIGATION      There were no vitals filed for this visit.    Pelvic Floor Physical Therapy Treatment Note  SCREENING  Changes in medications, allergies, or medical history?: no    SUBJECTIVE  Patient reports: She took a pain pill this morning when she woke up but was fine the night before. She feels like the inserts are working well, she is "walking on cloud nine". She is still having leakage with cough or sneeze. She is having regular BM's now. Has not been wearing her heel-lift since she got the arch-support inserts.  Precautions:   Seizures, Depression, bipolar, morbid obesity.  Pain update:  Location of pain: Low back Current pain:  0/10  Max pain:  7/10 Least pain:  0/10 Nature of pain: ache  Patient Goals: To not have to wear pads anymore for SUI.   OBJECTIVE  Changes in: Posture/Observations:  hyperlordotic  Range of Motion/Flexibilty:  Decreased sacral mobility, pain with pressure  Abdominal:  Pt. Demonstrates difficulty coordinating diaphragmatic breathing in seated.supine.  Palpation: TTP to B lumbar Paraspinals, Piriformis, OI,  INTERVENTIONS THIS SESSION: Manual: Performed TP release and STM to B lumbar Paraspinals, Piriformis, OI, to decrease spasm and pain and allow for improved balance of musculature for improved function and decreased symptoms. Performed PA sacral mobs to all borders to improve mobility of joint and surrounding connective tissue and decrease pressure on nerve roots for improved conductivity and function of down-stream tissues.  Therex: Educated on and practiced  3-way wall stretch, supine butterfly stretch and kneeling hip-flexor stretch to maintain and improve muscle length and allow for improved balance of musculature for long-term symptom relief.  NM Re-ed:Educated on and practiced diaphragmatic breathing with and without pelvic tilts to improve coordination of muscles surrounding and within the pelvis and low back to allow for improved coordination and motion of the PFM and improved function/decreased symptoms.  Self-care: educated Pt. On wearing her heel-lift as well as inserts, not one-or-the-other to allow for decreased pain.   Total time: 60 min.                           PT Short Term Goals - 03/04/19 1009      PT SHORT TERM GOAL #1   Title  Patient will demonstrate a coordinated contraction, relaxation, and bulge of the pelvic floor muscles to demonstrate functional recruitment and motion and allow for further strengthening.    Baseline  Pt. history indicates severe PFM dyssenergia    Time  5    Period  Weeks    Status  New    Target Date  04/08/19      PT SHORT TERM GOAL #2   Title  Patient will demonstrate improved pelvic alignment and balance of musculature surrounding the pelvis to facilitate decreased pressure on nerves innervating the pelvis and allow for improved PFM recruitment and function.    Baseline  Pt. demonstrates RLE 1.5 cm longer and up-slipped. Spasms surrounding R>L hip.     Time  5    Period  Weeks    Status  New    Target Date  04/08/19      PT SHORT TERM GOAL #3   Title  Patient will demonstrate improved sitting and standing posture to demonstrate learning and decrease stress on the pelvic floor with functional activity.    Baseline  Anterior pelvic tilt/hyperlordosis.    Time  5    Period  Weeks    Status  New    Target Date  04/08/19        PT Long Term Goals - 03/04/19 1012      PT LONG TERM GOAL #1   Title  Patient will report no episodes of SUI or FI over the course of the  prior two weeks to demonstrate improved functional ability.    Baseline  Having SUI and FI daily, wearing "double pads"    Time  10    Period  Weeks    Status  New    Target Date  05/13/19      PT LONG TERM GOAL #2   Title  Patient will score at or below 110/300 on the PFDI and 62/300 on the PFIQ to demonstrate a clinically meaningful decrease in disability and distress due to pelvic floor dysfunction.    Baseline  PFDI: 200/300, PFIQ: 152/300  Time  10    Period  Weeks    Status  New    Target Date  05/13/19      PT LONG TERM GOAL #3   Title  Patient will describe pain no greater than 2/10 during standing, walking, bending, and sleeping to demonstrate improved functional ability.    Baseline  Pain can get as high as 9.5/10    Time  10    Period  Weeks    Status  New    Target Date  05/13/19      PT LONG TERM GOAL #4   Title  Patient will report urinating 6-8 times per day over the course of the prior week to demonstrate decreased frequency.    Baseline  Feeling of incomplete emptying of bladder and bowels leading to frequency every ~1 hour.    Time  10    Period  Weeks    Status  New    Target Date  05/13/19      PT LONG TERM GOAL #5   Title  Patient will report having BM's at least every-other day with consistency between [facility] stool scale 3-5 over the prior week to demonstrate decreased constipation.    Baseline  Having to strain to empty bowel or bladder, 2 year Hx. of constipation    Time  10    Period  Weeks    Status  New    Target Date  05/13/19            Plan - 03/25/19 1049    Clinical Impression Statement  Pt. responded well to all interventions today, demonstrating decreased spasm, improved sacral mobility and understanding and correct performance of all educaton and exercises given. Continue per POC.    PT Next Visit Plan  seated pelvic tilts, ask about wall stretches and heel-lift, assess PFM and give kegels/anti-kegels, increase core strengthening     PT Home Exercise Plan  hip ABD and EXT, bowel retraining, squatty potty, water intake, 3-way wall stretches, hip-flexor stretch, diaphragmatic breathing, cat-cow,    Consulted and Agree with Plan of Care  Patient       Patient will benefit from skilled therapeutic intervention in order to improve the following deficits and impairments:     Visit Diagnosis: 1. Muscle weakness (generalized)   2. Other muscle spasm   3. Abnormal posture   4. Other lack of coordination        Problem List Patient Active Problem List   Diagnosis Date Noted  . Hives 02/23/2019  . Vitamin D deficiency 02/01/2019  . Mood disorder ([place]) 12/24/2018  . HLD (hyperlipidemia) 09/01/2018  . History of prediabetes 09/01/2018  . Morbid obesity ([place]) 09/01/2018  . Chronic pain 08/31/2018  . Allergic rhinitis 08/31/2018  . S/P laparoscopic cholecystectomy April 2019 01/12/2018  . Angioedema 07/07/2017  . Bilateral carpal tunnel syndrome 11/03/2016  . Frequent PVCs 09/24/2016  . Morbid obesity with BMI of 40.0-44.9, adult ([place]) 08/07/2016  . Essential hypertension 07/10/2016  . OSA on CPAP 07/10/2016   Willa Rough DPT, ATC Willa Rough 03/25/2019, 10:53 AM  [place] MAIN [facility] SERVICES [address] [place], [place], [address] Phone: [phone]   Fax:  [phone]  Name: Heather Sweeney MRN: [id number] Date of Birth: 1966/09/16

## 2019-03-28 ENCOUNTER — Telehealth: Payer: Self-pay | Admitting: Gastroenterology

## 2019-03-28 NOTE — Telephone Encounter (Signed)
Patient called & l/m to r/s her colonoscopy from 03-14-2019 with Dr Vicente Males.

## 2019-03-29 ENCOUNTER — Other Ambulatory Visit: Payer: Self-pay

## 2019-03-29 DIAGNOSIS — Z1211 Encounter for screening for malignant neoplasm of colon: Secondary | ICD-10-CM

## 2019-03-29 MED ORDER — NA SULFATE-K SULFATE-MG SULF 17.5-3.13-1.6 GM/177ML PO SOLN: 1.0000 | Freq: Once | ORAL | 0 refills | Status: AC

## 2019-03-29 NOTE — Telephone Encounter (Signed)
Screening colonoscopy has been scheduled with Dr. Vicente Males for 04/15/19.  Pt has been advised to have COVID test on 04/12/19.  New colonoscopy instructions, COVID Information sheet sent to patient in mail.  Rx for Suprep sent to pharmacy.  Thanks [employer]

## 2019-03-31 ENCOUNTER — Other Ambulatory Visit: Payer: Self-pay

## 2019-03-31 ENCOUNTER — Ambulatory Visit: Payer: Medicare HMO

## 2019-03-31 DIAGNOSIS — R293 Abnormal posture: Secondary | ICD-10-CM | POA: Diagnosis not present

## 2019-03-31 DIAGNOSIS — M62838 Other muscle spasm: Secondary | ICD-10-CM | POA: Diagnosis not present

## 2019-03-31 DIAGNOSIS — R278 Other lack of coordination: Secondary | ICD-10-CM

## 2019-03-31 DIAGNOSIS — M6281 Muscle weakness (generalized): Secondary | ICD-10-CM

## 2019-03-31 NOTE — Patient Instructions (Signed)
   Hip Abduction: Side Leg Lift - Side-Lying    KEEP HIP FORWARD!!!!! And KEEP BACK RELAXED!!!  Lie on side. Draw lower tummy muscle (TA) and pelvic floor in, Lift top leg until you feel strong contraction of muscle on the side of the hip. Keep top leg straight with body, toes pointing forward. Do not let your hip roll back! Do _10__ reps per set, __3_ sets per day  Hip Extension:    Lying face down, raise leg just off floor squeezing glutes. Keep leg straight. Hold 1 count. Lower leg to floor. Do _10__ reps per set, __3_ sets for each leg. Optional: Place small pillow under abdomen if back becomes uncomfortable.

## 2019-03-31 NOTE — Therapy (Signed)
[place] MAIN [facility] SERVICES [address] [place], [place], [address] Phone: [phone]   Fax:  [phone]  Physical Therapy Treatment  The patient has been informed of current processes in place at Outpatient Rehab to protect patients from Covid-19 exposure including social distancing, schedule modifications, and new cleaning procedures. After discussing their particular risk with a therapist based on the patient's personal risk factors, the patient has decided to proceed with in-person therapy.   Patient Details  Name: Heather Sweeney MRN: [id number] Date of Birth: March 09, 1966 Referring Provider (PT): Malachy Mood   Encounter Date: 03/31/2019  PT End of Session - 04/01/19 1409    Visit Number  5    Number of Visits  10    Date for PT Re-Evaluation  05/12/19    Authorization Type  [employer]    Authorization - Visit Number  5    Authorization - Number of Visits  10    PT Start Time  2423    PT Stop Time  5361    PT Time Calculation (min)  60 min    Activity Tolerance  Patient tolerated treatment well;No increased pain    Behavior During Therapy  WFL for tasks assessed/performed   Pt. needs repetition and easily understood vocabulary      Past Medical History:  Diagnosis Date  . Allergic rhinitis   . Bipolar disorder ([place])   . Chronic low back pain   . CTS (carpal tunnel syndrome)    hands  . Depression   . Gallstones   . GERD (gastroesophageal reflux disease)   . History of prediabetes   . Hypertension   . Laceration of right foot   . Onychomycosis   . OSA on CPAP   . Overactive bladder   . PVC's (premature ventricular contractions)   . Seizures ([place])    8th grade    Past Surgical History:  Procedure Laterality Date  . ABDOMINAL HYSTERECTOMY  07/08/2011  . ABDOMINAL HYSTERECTOMY     supracervical in 07/2011   . CARPAL TUNNEL RELEASE     right hand 1996  . CESAREAN SECTION     2012  . CHOLECYSTECTOMY  N/A 01/12/2018   Procedure: LAPAROSCOPIC CHOLECYSTECTOMY WITH INTRAOPERATIVE CHOLANGIOGRAM ERAS PATHWAY;  Surgeon: Johnathan Hausen, MD;  Location: [facility];  Service: General;  Laterality: N/A;  . DILATION AND CURETTAGE OF UTERUS     04/2011 benign endometrial polyp squamous metaplasia, inflamm, blood, mucous, benign   . FOOT SURGERY     Dr. Prudence Davidson   . LEEP     2006  . TUBAL LIGATION      There were no vitals filed for this visit.    Pelvic Floor Physical Therapy Treatment Note  SCREENING  Changes in medications, allergies, or medical history?: none    SUBJECTIVE  Patient reports: No change yet in bladder symptoms yet. Was feeling good but pain increased with standing hip EXT/ABD.  Precautions:   Seizures, Depression, bipolar, morbid obesity.  Pain update:  Location of pain: LB Current pain:  8.5/10  Max pain:  7/10 Least pain:  0/10 Nature of pain: ache  *Pain 0/10 following treatment.  Patient Goals: To not have to wear pads anymore for SUI.   OBJECTIVE  Changes in: Posture/Observations:  hyperlordosis  Palpation: TTP to R>L Iliacus, Psoas and R Glute min, and Lumbar multifidus.   INTERVENTIONS THIS SESSION: Manual: Performed TP release and STM to B  Iliacus, Psoas and R Glute min, and  Lumbar multifidus to decrease spasm and pain and allow for improved balance of musculature for improved function and decreased symptoms. Dry-needle: Performed TPDN as described below with .30x110mm needle and standard approach to decrease spasm and pain and allow for improved balance of musculature for improved function and decreased symptoms. Therex: modified HEP to replace standing hip EXT and ABD with lying to decrease back pain and compensatory strategy Reviewed hip-flexor stretch to improve efficacy.   Total time: 60 min.                    Trigger Point Dry Needling - 04/01/19 0001    Consent Given?  Yes    Education Handout Provided  No    Muscles  Treated Back/Hip  Gluteus minimus;Iliacus;Lumbar multifidi;Iliopsoas    Dry Needling Comments  Iliacus B, others on R only    Gluteus Minimus Response  Twitch response elicited;Palpable increased muscle length    Iliacus Response  Twitch response elicited;Palpable increased muscle length    Lumbar multifidi Response  Twitch response elicited;Palpable increased muscle length    Iliopsoas Response  Twitch response elicited;Palpable increased muscle length             PT Short Term Goals - 03/04/19 1009      PT SHORT TERM GOAL #1   Title  Patient will demonstrate a coordinated contraction, relaxation, and bulge of the pelvic floor muscles to demonstrate functional recruitment and motion and allow for further strengthening.    Baseline  Pt. history indicates severe PFM dyssenergia    Time  5    Period  Weeks    Status  New    Target Date  04/08/19      PT SHORT TERM GOAL #2   Title  Patient will demonstrate improved pelvic alignment and balance of musculature surrounding the pelvis to facilitate decreased pressure on nerves innervating the pelvis and allow for improved PFM recruitment and function.    Baseline  Pt. demonstrates RLE 1.5 cm longer and up-slipped. Spasms surrounding R>L hip.     Time  5    Period  Weeks    Status  New    Target Date  04/08/19      PT SHORT TERM GOAL #3   Title  Patient will demonstrate improved sitting and standing posture to demonstrate learning and decrease stress on the pelvic floor with functional activity.    Baseline  Anterior pelvic tilt/hyperlordosis.    Time  5    Period  Weeks    Status  New    Target Date  04/08/19        PT Long Term Goals - 03/04/19 1012      PT LONG TERM GOAL #1   Title  Patient will report no episodes of SUI or FI over the course of the prior two weeks to demonstrate improved functional ability.    Baseline  Having SUI and FI daily, wearing "double pads"    Time  10    Period  Weeks    Status  New    Target  Date  05/13/19      PT LONG TERM GOAL #2   Title  Patient will score at or below 110/300 on the PFDI and 62/300 on the PFIQ to demonstrate a clinically meaningful decrease in disability and distress due to pelvic floor dysfunction.    Baseline  PFDI: 200/300, PFIQ: 152/300    Time  10    Period  Weeks  Status  New    Target Date  05/13/19      PT LONG TERM GOAL #3   Title  Patient will describe pain no greater than 2/10 during standing, walking, bending, and sleeping to demonstrate improved functional ability.    Baseline  Pain can get as high as 9.5/10    Time  10    Period  Weeks    Status  New    Target Date  05/13/19      PT LONG TERM GOAL #4   Title  Patient will report urinating 6-8 times per day over the course of the prior week to demonstrate decreased frequency.    Baseline  Feeling of incomplete emptying of bladder and bowels leading to frequency every ~1 hour.    Time  10    Period  Weeks    Status  New    Target Date  05/13/19      PT LONG TERM GOAL #5   Title  Patient will report having BM's at least every-other day with consistency between [facility] stool scale 3-5 over the prior week to demonstrate decreased constipation.    Baseline  Having to strain to empty bowel or bladder, 2 year Hx. of constipation    Time  10    Period  Weeks    Status  New    Target Date  05/13/19            Plan - 04/01/19 1410    Clinical Impression Statement  Pt. responded well to all interventions this session demonstrating decreased spasm and pain as well as understanding and correct performance of all education and exercises, though needing moderate visual and verbal cueing. Continue per POC.    PT Next Visit Plan  seated pelvic tilts, assess PFM and give kegels/anti-kegels, increase core strengthening    PT Home Exercise Plan  hip ABD and EXT, bowel retraining, squatty potty, water intake, 3-way wall stretches, hip-flexor stretch, diaphragmatic breathing, cat-cow, Hip EXT and  ABD in lying    Consulted and Agree with Plan of Care  Patient       Patient will benefit from skilled therapeutic intervention in order to improve the following deficits and impairments:     Visit Diagnosis: 1. Muscle weakness (generalized)   2. Other muscle spasm   3. Abnormal posture   4. Other lack of coordination        Problem List Patient Active Problem List   Diagnosis Date Noted  . Hives 02/23/2019  . Vitamin D deficiency 02/01/2019  . Mood disorder ([place]) 12/24/2018  . HLD (hyperlipidemia) 09/01/2018  . History of prediabetes 09/01/2018  . Morbid obesity ([place]) 09/01/2018  . Chronic pain 08/31/2018  . Allergic rhinitis 08/31/2018  . S/P laparoscopic cholecystectomy April 2019 01/12/2018  . Angioedema 07/07/2017  . Bilateral carpal tunnel syndrome 11/03/2016  . Frequent PVCs 09/24/2016  . Morbid obesity with BMI of 40.0-44.9, adult ([place]) 08/07/2016  . Essential hypertension 07/10/2016  . OSA on CPAP 07/10/2016   Willa Rough DPT, ATC Willa Rough 04/01/2019, 2:15 PM  [place] MAIN [facility] SERVICES [address] [place], [place], [address] Phone: [phone]   Fax:  [phone]  Name: Heather Sweeney MRN: [id number] Date of Birth: 1965/12/16

## 2019-03-31 NOTE — Therapy (Deleted)
[place] MAIN [facility] SERVICES [address] [place], [place], [address] Phone: [phone]   Fax:  [phone]  March 31, 2019   @[email or web address] @  Physical Therapy Discharge Summary  Patient: Heather Sweeney  MRN: [id number]  Date of Birth: [birth year]   Diagnosis: No diagnosis found. Referring Provider (PT): Malachy Mood   The above patient had been seen in Physical Therapy *** times of *** treatments scheduled with *** no shows and *** cancellations.  The treatment consisted of *** The patient is: {improved/worse/unchanged:[id number]}  Subjective: ***  Discharge Findings: ***  Functional Status at Discharge: ***  {YPPJK:[id number]}    Sincerely,   Willa Rough, PT   CC @[email or web address] @  [place] [address] [place], [place], [address] Phone: [phone]   Fax:  [phone]  Patient: Heather Sweeney  MRN: [id number]  Date of Birth: [birth year]

## 2019-04-07 ENCOUNTER — Other Ambulatory Visit: Payer: Self-pay

## 2019-04-07 ENCOUNTER — Ambulatory Visit: Payer: Medicare HMO | Attending: Family Medicine

## 2019-04-07 DIAGNOSIS — M62838 Other muscle spasm: Secondary | ICD-10-CM | POA: Insufficient documentation

## 2019-04-07 DIAGNOSIS — M6281 Muscle weakness (generalized): Secondary | ICD-10-CM

## 2019-04-07 DIAGNOSIS — R278 Other lack of coordination: Secondary | ICD-10-CM | POA: Diagnosis not present

## 2019-04-07 DIAGNOSIS — R293 Abnormal posture: Secondary | ICD-10-CM | POA: Diagnosis not present

## 2019-04-07 NOTE — Therapy (Signed)
[place] MAIN [facility] SERVICES [address] [place], [place], [address] Phone: [phone]   Fax:  [phone]  Physical Therapy Treatment  The patient has been informed of current processes in place at Outpatient Rehab to protect patients from Covid-19 exposure including social distancing, schedule modifications, and new cleaning procedures. After discussing their particular risk with a therapist based on the patient's personal risk factors, the patient has decided to proceed with in-person therapy.   Patient Details  Name: Heather Sweeney MRN: [id number] Date of Birth: [birth year] Referring Provider (PT): Malachy Mood   Encounter Date: 04/07/2019  PT End of Session - 04/07/19 1348    Visit Number  6    Number of Visits  10    Date for PT Re-Evaluation  05/12/19    Authorization Type  [employer]/MCA[employer]    Authorization - Visit Number  6    Authorization - Number of Visits  10    PT Start Time  0160    PT Stop Time  1440    PT Time Calculation (min)  60 min    Activity Tolerance  Patient tolerated treatment well;No increased pain    Behavior During Therapy  WFL for tasks assessed/performed   Pt. needs repetition and easily understood vocabulary      Past Medical History:  Diagnosis Date  . Allergic rhinitis   . Bipolar disorder ([place])   . Chronic low back pain   . CTS (carpal tunnel syndrome)    hands  . Depression   . Gallstones   . GERD (gastroesophageal reflux disease)   . History of prediabetes   . Hypertension   . Laceration of right foot   . Onychomycosis   . OSA on CPAP   . Overactive bladder   . PVC's (premature ventricular contractions)   . Seizures ([place])    8th grade    Past Surgical History:  Procedure Laterality Date  . ABDOMINAL HYSTERECTOMY  07/08/2011  . ABDOMINAL HYSTERECTOMY     supracervical in 07/2011   . CARPAL TUNNEL RELEASE     right hand 1996  . CESAREAN SECTION     2012  . CHOLECYSTECTOMY  N/A 01/12/2018   Procedure: LAPAROSCOPIC CHOLECYSTECTOMY WITH INTRAOPERATIVE CHOLANGIOGRAM ERAS PATHWAY;  Surgeon: Johnathan Hausen, MD;  Location: [facility];  Service: General;  Laterality: N/A;  . DILATION AND CURETTAGE OF UTERUS     04/2011 benign endometrial polyp squamous metaplasia, inflamm, blood, mucous, benign   . FOOT SURGERY     Dr. Prudence Davidson   . LEEP     2006  . TUBAL LIGATION      There were no vitals filed for this visit.    Pelvic Floor Physical Therapy Treatment Note  SCREENING  Changes in medications, allergies, or medical history?: none    SUBJECTIVE  Patient reports: She is doing really well, has not had any pain, is only having a little SUI with coughing/laughing (drops) not flood and is down to wearing only 1 pad. No FI or urge incontinence over past week, She enjoys her exercises.  Precautions:  Seizures, Depression, bipolar, morbid obesity.  Pain update: None!  Patient Goals: To not have to wear pads anymore for SUI.   OBJECTIVE  Changes in: Posture/Observations:  Pt. Maintains good posture for ~ 85% of the session with little cueing.  Range of Motion/Flexibilty:  Decreased IR B, decreased ER on R only  Pelvic floor: Pt. Able to coordinate PFM contract/relax/bulge as palpated over underwear  with normal ROM  Gait Analysis: Toe out on L, toe-in on R, narrow BOS.  INTERVENTIONS THIS SESSION: Therex: Educated on and practiced anti-kegels to continue to improve PFM length and strength for maximal efficiency. Educated on and practiced kneeling planks and chair squats to improve strength of muscles opposing tight musculature to allow reciprocal inhibition to improve balance of musculature surrounding the pelvis and improve overall posture for optimal musculature length-tension relationship and function. NM re-ed: Educated on and practiced coordination of PFM with breathing as well as posture and coordination of core musculature with AMB to allow for sustained  and continued improvement in symptom reduction. Self-care: addressed alternative arch-support due to under-correction with Dr. Felicie Morn brand. Educated on making sure she does not try to cross her legs to hold in urine but rather uses a slight posterior pelvic tilt to help engage PFM.   Total time: 60 min.                            PT Short Term Goals - 04/07/19 1342      PT SHORT TERM GOAL #1   Title  Patient will demonstrate a coordinated contraction, relaxation, and bulge of the pelvic floor muscles to demonstrate functional recruitment and motion and allow for further strengthening.    Baseline  Pt. history indicates severe PFM dyssenergia    Time  5    Period  Weeks    Status  New    Target Date  04/08/19      PT SHORT TERM GOAL #2   Title  Patient will demonstrate improved pelvic alignment and balance of musculature surrounding the pelvis to facilitate decreased pressure on nerves innervating the pelvis and allow for improved PFM recruitment and function.    Baseline  Pt. demonstrates RLE 1.5 cm longer and up-slipped. Spasms surrounding R>L hip.     Time  5    Period  Weeks    Status  Achieved    Target Date  04/08/19      PT SHORT TERM GOAL #3   Title  Patient will demonstrate improved sitting and standing posture to demonstrate learning and decrease stress on the pelvic floor with functional activity.    Baseline  Anterior pelvic tilt/hyperlordosis.    Time  5    Period  Weeks    Status  Achieved    Target Date  04/08/19        PT Long Term Goals - 04/07/19 1342      PT LONG TERM GOAL #1   Title  Patient will report no episodes of SUI or FI over the course of the prior two weeks to demonstrate improved functional ability.    Baseline  Having SUI and FI daily, wearing "double pads" As of 7/2: is not having and FI and is just having mild SUi occasionally, just drops now.    Time  10    Period  Weeks    Status  On-going    Target Date   05/13/19      PT LONG TERM GOAL #2   Title  Patient will score at or below 110/300 on the PFDI and 62/300 on the PFIQ to demonstrate a clinically meaningful decrease in disability and distress due to pelvic floor dysfunction.    Baseline  PFDI: 200/300, PFIQ: 152/300 As of 04/07/2019: PFDI: 11/300, PFIQ: 54/300    Time  10    Period  Weeks  Status  Achieved    Target Date  05/13/19      PT LONG TERM GOAL #3   Title  Patient will describe pain no greater than 2/10 during standing, walking, bending, and sleeping to demonstrate improved functional ability.    Baseline  Pain can get as high as 9.5/10    Time  10    Period  Weeks    Status  Achieved    Target Date  05/13/19      PT LONG TERM GOAL #4   Title  Patient will report urinating 6-8 times per day over the course of the prior week to demonstrate decreased frequency.    Baseline  Feeling of incomplete emptying of bladder and bowels leading to frequency every ~1 hour.    Time  10    Period  Weeks    Status  Achieved    Target Date  05/13/19      PT LONG TERM GOAL #5   Title  Patient will report having BM's at least every-other day with consistency between [facility] stool scale 3-5 over the prior week to demonstrate decreased constipation.    Baseline  Having to strain to empty bowel or bladder, 2 year Hx. of constipation    Time  10    Period  Weeks    Status  Achieved    Target Date  05/13/19            Plan - 04/07/19 1349    Clinical Impression Statement  Pt. Responded well to all interventions today, demonstrating improved coordination of PFM, standing and walking posture and balance of musculature surrounding the hip as well as understanding and correct performance of all education and exercises provided today. They will continue to benefit from skilled physical therapy to work toward remaining goals and maximize function as well as decrease likelihood of symptom increase or recurrence.    PT Next Visit Plan  manual  for scar, discuss long-term HEP and fitness plan, review pre-squeeze and sneeze.    PT Home Exercise Plan  hip ABD and EXT, bowel retraining, squatty potty, water intake, 3-way wall stretches, hip-flexor stretch, diaphragmatic breathing, cat-cow, Hip EXT and ABD in lying    Consulted and Agree with Plan of Care  Patient       Patient will benefit from skilled therapeutic intervention in order to improve the following deficits and impairments:     Visit Diagnosis: 1. Muscle weakness (generalized)   2. Other muscle spasm   3. Abnormal posture   4. Other lack of coordination        Problem List Patient Active Problem List   Diagnosis Date Noted  . Hives 02/23/2019  . Vitamin D deficiency 02/01/2019  . Mood disorder ([place]) 12/24/2018  . HLD (hyperlipidemia) 09/01/2018  . History of prediabetes 09/01/2018  . Morbid obesity ([place]) 09/01/2018  . Chronic pain 08/31/2018  . Allergic rhinitis 08/31/2018  . S/P laparoscopic cholecystectomy April 2019 01/12/2018  . Angioedema 07/07/2017  . Bilateral carpal tunnel syndrome 11/03/2016  . Frequent PVCs 09/24/2016  . Morbid obesity with BMI of 40.0-44.9, adult ([place]) 08/07/2016  . Essential hypertension 07/10/2016  . OSA on CPAP 07/10/2016   Willa Rough DPT, ATC Willa Rough 04/07/2019, 3:19 PM  [place] MAIN [facility] SERVICES [address] [place], [place], [address] Phone: [phone]   Fax:  [phone]  Name: Avenell Sellers MRN: [id number] Date of Birth: [birth year]

## 2019-04-07 NOTE — Patient Instructions (Signed)
Anti-Kegel exercises:    With neutral spine, tighten pelvic floor by imagining you are stopping the flow of urine, squeezing only around the vagina and anus.  Quick-Flicks: Pull up and in quickly and then relax FULLY!!! allowing enough time for the muscles to full lengthen before the next contraction. Do _10__ repetitions in a row, stopping if the pelvic floor muscle gets tired and other muscles try to take over.  Long-Holds: Hold for _5__ seconds and then fully release, repeat _5__ times.   Repeat both of these exercises __1-3_ times throughout the day      Keep your trunk as one unit and let it hinge forward from the hips as you push your bottom back and bend your knees at the same rate that you bend your hips. Keep your weight back toward your heels but do not actually lift the toes off the ground. Exhale starting just before and all the way through standing to help engage the glutes and lower tummy muscles.  Do 2x15, 1-2 times per day    Start on all fours, tuck your hips under slightly and come forward until you are in a straight line. Make sure that your knees, your hips, and your shoulders are in a straight line. Hold for ~ 30 seconds, (or until you feel like you are too tired to hold it correctly). Repeat 2-3 times (or up to ~ 1:30 sec. Total).   As you get stronger, you can lengthen each hold. When you can hold for ~ 1:30 straight, try switching to on your toes instead of knees. You will need to decrease your hold time initially and build back up to the full 1:30.  * When walking, slightly tuck the pelvis under, keep feet ~ 3 inches apart. Think "tall" and let the arms swing naturally.

## 2019-04-12 ENCOUNTER — Other Ambulatory Visit
Admission: RE | Admit: 2019-04-12 | Discharge: 2019-04-12 | Disposition: A | Discharge status: Home / Self Care | Payer: Medicare HMO | Source: Ambulatory Visit | Attending: Gastroenterology | Admitting: Gastroenterology

## 2019-04-12 ENCOUNTER — Other Ambulatory Visit: Payer: Self-pay

## 2019-04-12 DIAGNOSIS — Z01812 Encounter for preprocedural laboratory examination: Secondary | ICD-10-CM | POA: Diagnosis not present

## 2019-04-12 DIAGNOSIS — Z1159 Encounter for screening for other viral diseases: Secondary | ICD-10-CM | POA: Insufficient documentation

## 2019-04-12 LAB — SARS CORONAVIRUS 2 (TAT 6-24 HRS): SARS Coronavirus 2: NEGATIVE

## 2019-04-13 ENCOUNTER — Ambulatory Visit: Payer: Medicare HMO

## 2019-04-14 ENCOUNTER — Ambulatory Visit: Payer: Medicare HMO

## 2019-04-15 ENCOUNTER — Ambulatory Visit
Admission: RE | Admit: 2019-04-15 | Discharge: 2019-04-15 | Disposition: A | Discharge status: Home / Self Care | Payer: Medicare HMO | Attending: Gastroenterology | Admitting: Gastroenterology

## 2019-04-15 ENCOUNTER — Encounter: Payer: Self-pay | Admitting: Anesthesiology

## 2019-04-15 ENCOUNTER — Encounter
Admission: RE | Disposition: A | Discharge status: Home / Self Care | Payer: Self-pay | Source: Home / Self Care | Attending: Gastroenterology

## 2019-04-15 ENCOUNTER — Ambulatory Visit: Payer: Medicare HMO | Admitting: Anesthesiology

## 2019-04-15 ENCOUNTER — Other Ambulatory Visit: Payer: Self-pay

## 2019-04-15 DIAGNOSIS — G4733 Obstructive sleep apnea (adult) (pediatric): Secondary | ICD-10-CM | POA: Diagnosis not present

## 2019-04-15 DIAGNOSIS — Z881 Allergy status to other antibiotic agents status: Secondary | ICD-10-CM | POA: Diagnosis not present

## 2019-04-15 DIAGNOSIS — J309 Allergic rhinitis, unspecified: Secondary | ICD-10-CM | POA: Insufficient documentation

## 2019-04-15 DIAGNOSIS — K219 Gastro-esophageal reflux disease without esophagitis: Secondary | ICD-10-CM | POA: Insufficient documentation

## 2019-04-15 DIAGNOSIS — Z880 Allergy status to penicillin: Secondary | ICD-10-CM | POA: Insufficient documentation

## 2019-04-15 DIAGNOSIS — Z791 Long term (current) use of non-steroidal anti-inflammatories (NSAID): Secondary | ICD-10-CM | POA: Diagnosis not present

## 2019-04-15 DIAGNOSIS — Z1211 Encounter for screening for malignant neoplasm of colon: Secondary | ICD-10-CM

## 2019-04-15 DIAGNOSIS — E785 Hyperlipidemia, unspecified: Secondary | ICD-10-CM | POA: Diagnosis not present

## 2019-04-15 DIAGNOSIS — Z79899 Other long term (current) drug therapy: Secondary | ICD-10-CM | POA: Diagnosis not present

## 2019-04-15 DIAGNOSIS — Z888 Allergy status to other drugs, medicaments and biological substances status: Secondary | ICD-10-CM | POA: Insufficient documentation

## 2019-04-15 DIAGNOSIS — I10 Essential (primary) hypertension: Secondary | ICD-10-CM | POA: Insufficient documentation

## 2019-04-15 DIAGNOSIS — E119 Type 2 diabetes mellitus without complications: Secondary | ICD-10-CM | POA: Diagnosis not present

## 2019-04-15 DIAGNOSIS — F319 Bipolar disorder, unspecified: Secondary | ICD-10-CM | POA: Insufficient documentation

## 2019-04-15 DIAGNOSIS — Z7982 Long term (current) use of aspirin: Secondary | ICD-10-CM | POA: Diagnosis not present

## 2019-04-15 HISTORY — PX: COLONOSCOPY WITH PROPOFOL: SHX5780

## 2019-04-15 SURGERY — COLONOSCOPY WITH PROPOFOL
Anesthesia: General

## 2019-04-15 MED ORDER — SODIUM CHLORIDE 0.9 % IV SOLN
INTRAVENOUS | Status: DC
  Administered 2019-04-15: 08:00:00 1000 mL via INTRAVENOUS

## 2019-04-15 MED ORDER — PROPOFOL 500 MG/50ML IV EMUL
INTRAVENOUS | Status: DC | PRN
  Administered 2019-04-15: 140 ug/kg/min via INTRAVENOUS

## 2019-04-15 MED ORDER — LIDOCAINE HCL (CARDIAC) PF 100 MG/5ML IV SOSY
PREFILLED_SYRINGE | INTRAVENOUS | Status: DC | PRN
  Administered 2019-04-15: 60 mg via INTRAVENOUS

## 2019-04-15 MED ORDER — PROPOFOL 10 MG/ML IV BOLUS
INTRAVENOUS | Status: DC | PRN
  Administered 2019-04-15: 70 mg via INTRAVENOUS

## 2019-04-15 NOTE — H&P (Signed)
Jonathon Bellows, MD [address], [place], [place], [place], [address] [address], [place], [place], [place], [address] Phone: [phone]  Fax: [phone]  Primary Care Physician:  McLean-Scocuzza, Nino Glow, MD   Pre-Procedure History & Physical: HPI:  Heather Sweeney is a 53 y.o. female is here for an colonoscopy.   Past Medical History:  Diagnosis Date  . Allergic rhinitis   . Bipolar disorder ([place])   . Chronic low back pain   . CTS (carpal tunnel syndrome)    hands  . Depression   . Gallstones   . GERD (gastroesophageal reflux disease)   . History of prediabetes   . Hypertension   . Laceration of right foot   . Onychomycosis   . OSA on CPAP   . Overactive bladder   . PVC's (premature ventricular contractions)   . Seizures ([place])    8th grade    Past Surgical History:  Procedure Laterality Date  . ABDOMINAL HYSTERECTOMY  07/08/2011  . ABDOMINAL HYSTERECTOMY     supracervical in 07/2011   . CARPAL TUNNEL RELEASE     right hand 1996  . CESAREAN SECTION     2012  . CHOLECYSTECTOMY N/A 01/12/2018   Procedure: LAPAROSCOPIC CHOLECYSTECTOMY WITH INTRAOPERATIVE CHOLANGIOGRAM ERAS PATHWAY;  Surgeon: Johnathan Hausen, MD;  Location: [facility];  Service: General;  Laterality: N/A;  . DILATION AND CURETTAGE OF UTERUS     04/2011 benign endometrial polyp squamous metaplasia, inflamm, blood, mucous, benign   . FOOT SURGERY     Dr. Prudence Davidson   . LEEP     2006  . TUBAL LIGATION      Prior to Admission medications   Medication Sig Start Date End Date Taking? Authorizing Provider  amLODipine (NORVASC) 5 MG tablet Take 1 tablet (5 mg total) by mouth daily. 02/19/19 02/19/20 Yes Merlyn Lot, MD  aspirin 81 MG EC tablet Take 1 tablet (81 mg total) by mouth daily. 12/24/18  Yes McLean-Scocuzza, Nino Glow, MD  benztropine (COGENTIN) 1 MG tablet Take 1 mg by mouth daily as needed (anxiety).    Yes [provider]  camphor-menthol Timoteo Ace) lotion Apply 1 application  topically as needed for itching. 02/17/19  Yes McLean-Scocuzza, Nino Glow, MD  cetirizine (ZYRTEC) 10 MG tablet Take 1 tablet (10 mg total) by mouth daily. 12/24/18  Yes McLean-Scocuzza, Nino Glow, MD  EPINEPHrine (EPIPEN 2-PAK) 0.3 mg/0.3 mL IJ SOAJ injection Inject 0.3 mLs (0.3 mg total) into the muscle daily as needed (allergic reactions). 12/24/18  Yes McLean-Scocuzza, Nino Glow, MD  EPINEPHrine 0.3 mg/0.3 mL IJ SOAJ injection Inject 0.3 mLs (0.3 mg total) into the muscle as needed for anaphylaxis. 02/19/19  Yes Merlyn Lot, MD  FLUoxetine (PROZAC) 20 MG capsule Take 20 mg by mouth daily.   Yes [provider]  fluPHENAZine (PROLIXIN) 2.5 MG tablet Take 2.5 mg by mouth at bedtime.    Yes [provider]  fluticasone (FLONASE) 50 MCG/ACT nasal spray Place 2 sprays into both nostrils daily. 08/31/18  Yes McLean-Scocuzza, Nino Glow, MD  gabapentin (NEURONTIN) 300 MG capsule Take 1 capsule (300 mg total) by mouth 3 (three) times daily. 12/24/18  Yes McLean-Scocuzza, Nino Glow, MD  hydrocortisone 2.5 % cream Apply topically 2 (two) times daily. Face for rash 02/22/19  Yes McLean-Scocuzza, Nino Glow, MD  hydrOXYzine (ATARAX/VISTARIL) 25 MG tablet Take 1 tablet (25 mg total) by mouth every 8 (eight) hours as needed for itching. Or itchy rash do not take with  zyrtec at the same time 02/23/19  Yes McLean-Scocuzza, Nino Glow, MD  meloxicam (MOBIC) 15 MG tablet meloxicam 15 mg tablet  Take 1 tablet(s) every day by oral route with meals.   Yes [provider]  metoprolol succinate (TOPROL-XL) 25 MG 24 hr tablet Take 1 tablet (25 mg total) by mouth daily. Take with or immediately following a meal. 12/24/18  Yes McLean-Scocuzza, Nino Glow, MD  oxyCODONE-acetaminophen (PERCOCET) 10-325 MG tablet Take 1 tablet by mouth every 6 (six) hours as needed for pain. Patient taking differently: Take 1 tablet by mouth every 8 (eight) hours as needed for pain (Dr. Mirna Mires pain).  01/12/18  Yes Johnathan Hausen, MD   pantoprazole (PROTONIX) 40 MG tablet Take 1 tablet (40 mg total) by mouth daily. 12/24/18  Yes McLean-Scocuzza, Nino Glow, MD  polyethylene glycol (MIRALAX / GLYCOLAX) packet Take 17 g by mouth daily as needed for mild constipation.   Yes [provider]  pravastatin (PRAVACHOL) 10 MG tablet Take 1 tablet (10 mg total) by mouth daily. 12/24/18  Yes McLean-Scocuzza, Nino Glow, MD  predniSONE (DELTASONE) 10 MG tablet Take 1 tablet (10 mg total) by mouth daily. Day 1-2: Take 50 mg  ( 5 pills) Day 3-4 : Take 40 mg (4pills) Day 5-6: Take 30 mg (3 pills) Day 7-8:  Take 20 mg (2 pills) Day 9:  Take 10mg  (1 pill) 02/19/19  Yes Merlyn Lot, MD  spironolactone (ALDACTONE) 25 MG tablet Take 0.5 tablets (12.5 mg total) by mouth daily. 12/24/18  Yes McLean-Scocuzza, Nino Glow, MD  triamcinolone cream (KENALOG) 0.1 % Apply 1 application topically 2 (two) times daily. As needed to bumps on skin not on face 02/17/19  Yes McLean-Scocuzza, Nino Glow, MD    Allergies as of 03/29/2019 - Review Complete 03/03/2019  Allergen Reaction Noted  . Cephalexin Swelling and Rash 11/01/2015  . Hctz [hydrochlorothiazide] Rash and Swelling 11/01/2015  . Influenza vaccines Anaphylaxis 07/08/2017  . Penicillins Rash 11/01/2015  . Bee venom  12/27/2018  . Losartan Hives and Swelling 07/08/2017  . Metformin and related  02/14/2019  . Lisinopril Hives and Other (See Comments) 11/01/2015    Family History  Problem Relation Age of Onset  . Cancer Father        esophageal   . Hypertension Father   . Heart failure Mother   . Hypertension Mother   . Early death Mother   . Heart disease Mother        chf  . Breast cancer Sister 52  . Cancer Sister        breast  . Depression Sister   . Mental illness Sister   . Asthma Son   . Cancer Other        brain cancer    Social History   Socioeconomic History  . Marital status: Married    Spouse name: Not on file  . Number of children: Not on file  . Years of  education: Not on file  . Highest education level: Not on file  Occupational History  . Not on file  Social Needs  . Financial resource strain: Not on file  . Food insecurity    Worry: Not on file    Inability: Not on file  . Transportation needs    Medical: Not on file    Non-medical: Not on file  Tobacco Use  . Smoking status: Never Smoker  . Smokeless tobacco: Current User    Types: Snuff  . Tobacco comment: Is in  process of decreasing amount  Substance and Sexual Activity  . Alcohol use: No    Alcohol/week: 0.0 standard drinks  . Drug use: No  . Sexual activity: Yes    Comment: men  Lifestyle  . Physical activity    Days per week: Not on file    Minutes per session: Not on file  . Stress: Not on file  Relationships  . Social Herbalist on phone: Not on file    Gets together: Not on file    Attends religious service: Not on file    Active member of club or organization: Not on file    Attends meetings of clubs or organizations: Not on file    Relationship status: Not on file  . Intimate partner violence    Fear of current or ex partner: Not on file    Emotionally abused: Not on file    Physically abused: Not on file    Forced sexual activity: Not on file  Other Topics Concern  . Not on file  Social History Narrative   Married    No guns    Wears seat belt    Safe in relationship    Never smoker    2 sons Gaspar Bidding ~8 and another son ~26 as of 08/2018     2 sisters    Unemployed        Review of Systems: See HPI, otherwise negative ROS  Physical Exam: BP 128/71   Pulse 71   Temp 97.8 F (36.6 C) (Tympanic)   Resp 20   Ht 5\' 3"  (1.6 m)   Wt 86.2 kg   SpO2 99%   BMI 33.66 kg/m  General:   Alert,  pleasant and cooperative in NAD Head:  Normocephalic and atraumatic. Neck:  Supple; no masses or thyromegaly. Lungs:  Clear throughout to auscultation, normal respiratory effort.    Heart:  +S1, +S2, Regular rate and rhythm, No edema. Abdomen:   Soft, nontender and nondistended. Normal bowel sounds, without guarding, and without rebound.   Neurologic:  Alert and  oriented x4;  grossly normal neurologically.  Impression/Plan: Heather Sweeney is here for an colonoscopy to be performed for Screening colonoscopy average risk   Risks, benefits, limitations, and alternatives regarding  colonoscopy have been reviewed with the patient.  Questions have been answered.  All parties agreeable.   Jonathon Bellows, MD  04/15/2019, 9:01 AM

## 2019-04-15 NOTE — Transfer of Care (Signed)
Immediate Anesthesia Transfer of Care Note  Patient: Heather Sweeney  Procedure(s) Performed: COLONOSCOPY WITH PROPOFOL (N/A )  Patient Location: PACU  Anesthesia Type:General  Level of Consciousness: sedated  Airway & Oxygen Therapy: Patient Spontanous Breathing and Patient connected to nasal cannula oxygen  Post-op Assessment: Report given to RN and Post -op Vital signs reviewed and stable  Post vital signs: Reviewed and stable  Last Vitals:  Vitals Value Taken Time  BP 108/72 04/15/19 0930  Temp 36.2 C 04/15/19 0930  Pulse 72 04/15/19 0930  Resp 23 04/15/19 0930  SpO2 100 % 04/15/19 0930    Last Pain:  Vitals:   04/15/19 0930  TempSrc:   PainSc: 0-No pain         Complications: No apparent anesthesia complications

## 2019-04-15 NOTE — Anesthesia Post-op Follow-up Note (Signed)
Anesthesia QCDR form completed.        

## 2019-04-15 NOTE — Op Note (Signed)
[facility] Gastroenterology Patient Name: Heather Sweeney Procedure Date: 04/15/2019 9:02 AM MRN: [id number] Account #: [id number] Date of Birth: 27-Aug-1966 Admit Type: Outpatient Age: 53 Room: [facility] ENDO ROOM 4 Gender: Female Note Status: Finalized Procedure:            Colonoscopy Indications:          Screening for colorectal malignant neoplasm Providers:            Jonathon Bellows MD, MD Referring MD:         [name] MD, MD (Referring MD) Medicines:            Monitored Anesthesia Care Complications:        No immediate complications. Procedure:            Pre-Anesthesia Assessment:                       - Prior to the procedure, a History and Physical was                        performed, and patient medications, allergies and                        sensitivities were reviewed. The patient's tolerance of                        previous anesthesia was reviewed.                       - The risks and benefits of the procedure and the                        sedation options and risks were discussed with the                        patient. All questions were answered and informed                        consent was obtained.                       - ASA Grade Assessment: II - A patient with mild                        systemic disease.                       After obtaining informed consent, the colonoscope was                        passed under direct vision. Throughout the procedure,                        the patient's blood pressure, pulse, and oxygen                        saturations were monitored continuously. The                        Colonoscope was introduced through the anus and  advanced to the the cecum, identified by the                        appendiceal orifice, IC valve and transillumination.                        The colonoscopy was performed with ease. The patient                        tolerated the procedure  well. The quality of the bowel                        preparation was adequate. Findings:      The perianal and digital rectal examinations were normal.      The entire examined colon appeared normal on direct and retroflexion       views. Impression:           - The entire examined colon is normal on direct and                        retroflexion views.                       - No specimens collected. Recommendation:       - Discharge patient to home (with escort).                       - Resume previous diet.                       - Continue present medications.                       - Repeat colonoscopy in 10 years for screening purposes. Procedure Code(s):    --- Professional ---                       [phone], Colonoscopy, flexible; diagnostic, including                        collection of specimen(s) by brushing or washing, when                        performed (separate procedure) Diagnosis Code(s):    --- Professional ---                       Z12.11, Encounter for screening for malignant neoplasm                        of colon CPT copyright 2019 [employer]. All rights reserved. The codes documented in this report are preliminary and upon coder review may  be revised to meet current compliance requirements. Jonathon Bellows, MD Jonathon Bellows MD, MD 04/15/2019 9:26:53 AM This report has been signed electronically. Number of Addenda: 0 Note Initiated On: 04/15/2019 9:02 AM Scope Withdrawal Time: 0 hours 14 minutes 21 seconds  Total Procedure Duration: 0 hours 17 minutes 25 seconds  Estimated Blood Loss: Estimated blood loss: none.      [facility]

## 2019-04-15 NOTE — Anesthesia Preprocedure Evaluation (Addendum)
Anesthesia Evaluation  Patient identified by MRN, date of birth, ID band Patient awake    Reviewed: Allergy & Precautions, NPO status , Patient's Chart, lab work & pertinent test results, reviewed documented beta blocker date and time   Airway Mallampati: II  TM Distance: >3 FB Neck ROM: Full    Dental  (+) Partial Lower, Partial Upper   Pulmonary sleep apnea and Continuous Positive Airway Pressure Ventilation ,    Pulmonary exam normal breath sounds clear to auscultation       Cardiovascular hypertension, Pt. on medications and Pt. on home beta blockers Normal cardiovascular exam Rhythm:Regular Rate:Normal     Neuro/Psych Seizures -,  PSYCHIATRIC DISORDERS Depression Bipolar Disorder  Neuromuscular disease    GI/Hepatic Neg liver ROS, GERD  Medicated and Controlled,  Endo/Other  diabetes, Type 2, Oral Hypoglycemic AgentsMorbid obesity  Renal/GU negative Renal ROS Bladder dysfunction      Musculoskeletal   Abdominal (+) + obese,   Peds negative pediatric ROS (+)  Hematology negative hematology ROS (+)   Anesthesia Other Findings Past Medical History: No date: Allergic rhinitis No date: Bipolar disorder (HCC) No date: Chronic low back pain No date: CTS (carpal tunnel syndrome)     Comment:  hands No date: Depression No date: Gallstones No date: GERD (gastroesophageal reflux disease) No date: History of prediabetes No date: Hypertension No date: Laceration of right foot No date: Onychomycosis No date: OSA on CPAP No date: Overactive bladder No date: PVC's (premature ventricular contractions) No date: Seizures (HCC)     Comment:  8th grade  Reproductive/Obstetrics                             Anesthesia Physical  Anesthesia Plan  ASA: III  Anesthesia Plan: General   Post-op Pain Management:    Induction: Intravenous  PONV Risk Score and Plan: 4 or greater and TIVA  Airway  Management Planned: Nasal Cannula  Additional Equipment:   Intra-op Plan:   Post-operative Plan:   Informed Consent: I have reviewed the patients History and Physical, chart, labs and discussed the procedure including the risks, benefits and alternatives for the proposed anesthesia with the patient or authorized representative who has indicated his/her understanding and acceptance.     Dental advisory given  Plan Discussed with: CRNA and Surgeon  Anesthesia Plan Comments:         Anesthesia Quick Evaluation

## 2019-04-18 ENCOUNTER — Encounter: Payer: Self-pay | Admitting: Gastroenterology

## 2019-04-18 NOTE — Anesthesia Postprocedure Evaluation (Signed)
Anesthesia Post Note  Patient: Heather Sweeney  Procedure(s) Performed: COLONOSCOPY WITH PROPOFOL (N/A )  Patient location during evaluation: Endoscopy Anesthesia Type: General Level of consciousness: awake and alert and oriented Pain management: pain level controlled Vital Signs Assessment: post-procedure vital signs reviewed and stable Respiratory status: spontaneous breathing Cardiovascular status: blood pressure returned to baseline Anesthetic complications: no     Last Vitals:  Vitals:   04/15/19 0951 04/15/19 1001  BP: 124/80 124/69  Pulse: 63 62  Resp: (!) 22 (!) 24  Temp:    SpO2: 100% 100%    Last Pain:  Vitals:   04/16/19 0917  TempSrc:   PainSc: 0-No pain                 Sreshta Cressler

## 2019-04-21 ENCOUNTER — Ambulatory Visit: Payer: Medicare HMO

## 2019-04-21 ENCOUNTER — Telehealth: Payer: Self-pay

## 2019-04-21 NOTE — Telephone Encounter (Signed)
Pt taking pravastatin

## 2019-04-22 DIAGNOSIS — G894 Chronic pain syndrome: Secondary | ICD-10-CM | POA: Diagnosis not present

## 2019-04-22 DIAGNOSIS — M545 Low back pain: Secondary | ICD-10-CM | POA: Diagnosis not present

## 2019-04-22 DIAGNOSIS — Z79891 Long term (current) use of opiate analgesic: Secondary | ICD-10-CM | POA: Diagnosis not present

## 2019-04-28 ENCOUNTER — Ambulatory Visit: Payer: Medicare HMO

## 2019-04-28 ENCOUNTER — Other Ambulatory Visit: Payer: Self-pay

## 2019-04-28 DIAGNOSIS — R278 Other lack of coordination: Secondary | ICD-10-CM | POA: Diagnosis not present

## 2019-04-28 DIAGNOSIS — R293 Abnormal posture: Secondary | ICD-10-CM | POA: Diagnosis not present

## 2019-04-28 DIAGNOSIS — M6281 Muscle weakness (generalized): Secondary | ICD-10-CM

## 2019-04-28 DIAGNOSIS — M62838 Other muscle spasm: Secondary | ICD-10-CM

## 2019-04-28 NOTE — Patient Instructions (Addendum)
Your Vagina is Not Cussing! 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 Should that be "crotches"? Can you make that plural? If not, why not? Tell me that.. But, cleaning the crotch - it's important. We clean our face, our armpits and our feet. The crotch has got a lot going on so it should be cleaned too, right? Women clean themselves differently, but that's not necessarily okay. There are some basic facts that are important to know when it comes to keeping your machine well-oiled and running, regardless of whether she's a [place] or a 2015 [employer]; cuz either way she's a beauty, right? So what is the right way for a woman to clean her vulvovaginal area in order to ensure cleanliness, odor reduction and avoidance of infection? Let's start with what I hear from patients: 1. "I usually douche because that's what my mother did." 2. "I use a lavender scented soap all over my body and I get a wash rag and scrub my vulva." 3. "I spread my labias and get soap on them and then I put soap inside my vagina. I'm very clean." 4. "I'm careful, so I go front to back with the soap. I start at the vulva and soap it real good, then I reach over to my anus and get that soapy." 5. "I use a loofa on my vulva and then after I shower I spray a little perfume down there. You never know what's going to happen that day." Friends, Romans, Countrywomen - lend me your ear! All these people are WRONG! (and that's probably one reason why they're seeing me in the first place) If you want my advice, I'm going to be succinct, clear and direct. You can wash your vulvovaginal area any way you'd like as long as you are in the shower, eliminate all soap and let warm water run over the area and only use your hands. Just call me the Lorene Dy of the vagina.or is that weird?  Here's what I want you to do: 1.  Wash your hair. 2. Wash your body with soap. 3. Rinse everything off. 4. Let warm water rinse over your labias. Yes, you can spread your labias. 5. Let warm water rinse over your anus. 6. Get out of the shower.** 7. Gently and lovingly pat the vulva dry and put on white, cotton underwear. **You can wash your hands before getting out. So why am I so restricting? Here's why: 1. The vagina is self-cleansing. There is no need to douche or soap inside the vagina. It's already got a good bacteria called lactobacilli that has several important functions. Lactobacilli eats up bad bacteria that can cause infection, it keeps the vagina acidic in order to reduce the likelihood of infections and it's even postulated that lactobacilli can prompt the immune system. This helps reduce odor, infection and keeps the natural flora healthy. Oh, and get this - estrogen helps to feed lactobacilli. So if you're low on estrogen, it makes sense that you might be prone to more infections. Please, just don't use soap on the vulva or in the vagina. Trust me, your vagina is not cussing. (Ironically enough, the inside of the mouth is made up of the same durable tissue as the inside of the vagina.) 2. The vulva wasn't meant to be scrubbed - it's not a potato. The vulva is sensitive  like your fingertips, the skin around your eyes and your lips. It's meant to detect fine detail (for pleasure), so being forceful with it is going to make it more sensitive in a negative way - hypersensitive (for pain). Scrubbing can remove a fine layer of the vulvovaginal tissue which can create an anti-histamine response - much like scrubbing your arm would make your arm red. This creates an inflammatory cascade of events. Many people will heal from this quite quickly and may not notice any discomfort, but others may start to notice some irritation after some time. This is when you might start noticing sensitivity to things that never bothered you before  like tight clothes, colorful underwear, lubricants or laundry detergent. 3. Scented items (or items with chemicals) like perfume (on the vulva), soap, bubble bath or even flavored or hot/cold/tingly/prickly/naughty sexual lubricant/condoms should be avoided as well because they could irritate the opening of the vagina (the vulvar vestibule) or the vagina itself. The vulvar vestibule is made of up different tissue than the vagina (but the same tissue as the urethra and bladder), so it's possible that using chemical products here can cause pain and the symptoms of a urinary tract infection (UTI). 4. The vagina needs to breathe. Wearing tight, conforming clothing all the time or daily pantyliners can be suffocating to your vulvovaginal area and irritating to the skin. Give it a break sometimes and wear looser clothing and or no underwear at all (like at night). 5. If you have a sensitive vulva or are prone to a lot of symptoms of infections, consider wearing white, cotton underwear instead of the fancy stuff. Over time, it's possible to develop new allergies and unfortunately, some women develop allergies to synthetic materials and dyes in their underwear. This also means it's best to wash your underwear with a detergent that is made for sensitive skin and is free of chemicals. ** Note - we will expand this area in the near future (with Sara's blessings) to include other options for under wear or safe liners. Stay tuned!  And get this: Discharge doesn't mean you are dirty. Discharge is natural and comes from a variety of places, most of which are not the vagina itself. What you see on your underwear is a mix of oil and gland secretions from the vulva and it's also secretions from the uterus and the fallopian tubes. Discharge changes during different parts of the menstrual cycle because it serves different purposes. For example, when you are ovulating, the discharge is a different consistency so that sperm can pass  through it more easily. It's all normal and healthy. However, if it starts to change colors or smell really funky - this indicates a possible issue with an area that is potentially apart from the vagina. Soaping and scrubbing to high Gillie Manners is not going to fix this - you really need to get checked out by a doctor in this situation. Taking care of the vulvovaginal tissue is easier than we want it to be. Less is more. So much more. Good, simple vulvovaginal hygiene means better flora (not fauna), reduced odor, less itching and less discomfort. So cheers to you and your polite vagina. That little number was raised right! -Bing Neighbors. Sauder PT, DPT     Start on all fours, tuck your hips under slightly and come forward until you are in a straight line. Make sure that your knees, your hips, and your shoulders are in a straight line. Hold for ~ 30 seconds, (  or until you feel like you are too tired to hold it correctly). Repeat 2-3 times (or up to ~ 1:30 sec. Total).   As you get stronger, you can lengthen each hold. When you can hold for ~ 1:30 straight, try switching to on your toes instead of knees. You will need to decrease your hold time initially and build back up to the full 1:30.       Urge supression technique:  1) Take a deep breath to convince yourself that you are in control and calm the nervous system.  2) Do 5 "quick-flick" kegels (pelvic floor muscle contractions) and re-assess the urge. Repeat another set if urge is still present. 3) Once the urge has decreased, start walking calmly to the the bathroom. Stop and repeat steps 1 and 2 as many times as needed until you can successfully get to the toilet. 4) Only once seated, take a deep breath and allow the pelvic floor muscles to relax and allow for the urine to flow.    Do not be discouraged if you are not successful the first couple times, this is normal and it will take practice but remember that YOU are in control. Start by practicing  this at home where you do not have to worry as much if there were to be an accident. Allowing yourself to get rushed or nervous puts the bladder back in control and will not allow the technique to work.

## 2019-04-28 NOTE — Therapy (Signed)
[place] MAIN [facility] SERVICES [address] [place], [place], [address] Phone: [phone]   Fax:  [phone]  Physical Therapy Treatment  The patient has been informed of current processes in place at Outpatient Rehab to protect patients from Covid-19 exposure including social distancing, schedule modifications, and new cleaning procedures. After discussing their particular risk with a therapist based on the patient's personal risk factors, the patient has decided to proceed with in-person therapy.   Patient Details  Name: Heather Sweeney MRN: [id number] Date of Birth: 1965/12/19 Referring Provider (PT): Malachy Mood   Encounter Date: 04/28/2019  PT End of Session - 04/29/19 1005    Visit Number  7    Number of Visits  10    Date for PT Re-Evaluation  05/12/19    Authorization Type  [employer]    Authorization - Visit Number  7    Authorization - Number of Visits  10    PT Start Time  0165    PT Stop Time  5374    PT Time Calculation (min)  55 min    Activity Tolerance  Patient tolerated treatment well;No increased pain    Behavior During Therapy  WFL for tasks assessed/performed   Pt. needs repetition and easily understood vocabulary      Past Medical History:  Diagnosis Date  . Allergic rhinitis   . Bipolar disorder ([place])   . Chronic low back pain   . CTS (carpal tunnel syndrome)    hands  . Depression   . Gallstones   . GERD (gastroesophageal reflux disease)   . History of prediabetes   . Hypertension   . Laceration of right foot   . Onychomycosis   . OSA on CPAP   . Overactive bladder   . PVC's (premature ventricular contractions)   . Seizures ([place])    8th grade    Past Surgical History:  Procedure Laterality Date  . ABDOMINAL HYSTERECTOMY  07/08/2011  . ABDOMINAL HYSTERECTOMY     supracervical in 07/2011   . CARPAL TUNNEL RELEASE     right hand 1996  . CESAREAN SECTION     2012  . CHOLECYSTECTOMY N/A  01/12/2018   Procedure: LAPAROSCOPIC CHOLECYSTECTOMY WITH INTRAOPERATIVE CHOLANGIOGRAM ERAS PATHWAY;  Surgeon: Johnathan Hausen, MD;  Location: [facility] ORS;  Service: General;  Laterality: N/A;  . COLONOSCOPY WITH PROPOFOL N/A 04/15/2019   Procedure: COLONOSCOPY WITH PROPOFOL;  Surgeon: Jonathon Bellows, MD;  Location: [facility] ENDOSCOPY;  Service: Gastroenterology;  Laterality: N/A;  . DILATION AND CURETTAGE OF UTERUS     04/2011 benign endometrial polyp squamous metaplasia, inflamm, blood, mucous, benign   . FOOT SURGERY     Dr. Prudence Davidson   . LEEP     2006  . TUBAL LIGATION      There were no vitals filed for this visit.   Pelvic Floor Physical Therapy Treatment Note  SCREENING  Changes in medications, allergies, or medical history?: none    SUBJECTIVE  Patient reports: She is doing really well, has just had a little pain in her R outer hip/buttock that does not go into the RLE. She has only been having a little bit of SUI/ could be discharge. Sometimes still has urge to urinate after emptying bladder with only drops coming out when she attempts to go again.  Precautions:  Seizures, Depression, bipolar, morbid obesity.  Pain update: "little bit" in there R hip  Patient Goals: To not have to wear pads anymore for SUI.  OBJECTIVE  Changes in:  Range of Motion/Flexibilty:  Decreased IR B, decreased ER on R only  Abdominal: Requires min. TC and VC for TA recruitment for plank and to push up through shoulders.  Gait Analysis: Toe out on L, toe-in on R, narrow BOS.  INTERVENTIONS THIS SESSION: Self-care: Reviewed goals and discussed remaining deficits for POC development/modification. Reviewed pre-squeeze and sneeze technique for SUI. Educated on female hygiene to decrease odor/discharge and help differentiate between discharge and SUI to help determine what intervention may be needed. Manual: Educated on how to decrease spasms and pain with use of tennis ball and TP release for ability to  manage myofascial pain upon D/C if it returns. THerex: educated on and practiced kneeling planks to increase deep-core strength. Educated on how to adjust her home exercise bike to prevent misuse/imbalance of musculature and allow her to safely return to exercise for weight loss. Reviewed when it is appropriate to return to use of her abdominal strengthening machine and how to progress to that point.   Total time: 55 min.                              PT Short Term Goals - 04/28/19 1712      PT SHORT TERM GOAL #1   Title  Patient will demonstrate a coordinated contraction, relaxation, and bulge of the pelvic floor muscles to demonstrate functional recruitment and motion and allow for further strengthening.    Baseline  Pt. history indicates severe PFM dyssenergia    Time  5    Period  Weeks    Status  Achieved    Target Date  04/08/19      PT SHORT TERM GOAL #2   Title  Patient will demonstrate improved pelvic alignment and balance of musculature surrounding the pelvis to facilitate decreased pressure on nerves innervating the pelvis and allow for improved PFM recruitment and function.    Baseline  Pt. demonstrates RLE 1.5 cm longer and up-slipped. Spasms surrounding R>L hip.     Time  5    Period  Weeks    Status  Achieved    Target Date  04/08/19      PT SHORT TERM GOAL #3   Title  Patient will demonstrate improved sitting and standing posture to demonstrate learning and decrease stress on the pelvic floor with functional activity.    Baseline  Anterior pelvic tilt/hyperlordosis.    Time  5    Period  Weeks    Status  Achieved    Target Date  04/08/19        PT Long Term Goals - 04/28/19 1712      PT LONG TERM GOAL #1   Title  Patient will report no episodes of SUI or FI over the course of the prior two weeks to demonstrate improved functional ability.    Baseline  Having SUI and FI daily, wearing "double pads" As of 7/2: is not having and FI and is  just having mild SUi occasionally, just drops now.    Time  10    Period  Weeks    Status  Achieved    Target Date  05/13/19      PT LONG TERM GOAL #2   Title  Patient will score at or below 110/300 on the PFDI and 62/300 on the PFIQ to demonstrate a clinically meaningful decrease in disability and distress due to pelvic floor dysfunction.  Baseline  PFDI: 200/300, PFIQ: 152/300 As of 04/07/2019: PFDI: 11/300, PFIQ: 54/300 As of 7/24: PFDI: 11/300, PFIQ: 0/300    Time  10    Period  Weeks    Status  Achieved    Target Date  05/13/19      PT LONG TERM GOAL #3   Title  Patient will describe pain no greater than 2/10 during standing, walking, bending, and sleeping to demonstrate improved functional ability.    Baseline  Pain can get as high as 9.5/10    Time  10    Period  Weeks    Status  Achieved      PT LONG TERM GOAL #4   Title  Patient will report urinating 6-8 times per day over the course of the prior week to demonstrate decreased frequency.    Baseline  Feeling of incomplete emptying of bladder and bowels leading to frequency every ~1 hour. As of 7/24: Having difficulty emptying bladder only, occasionally, other times can go 2-3 hours.    Time  10    Period  Weeks    Status  Partially Met    Target Date  05/13/19      PT LONG TERM GOAL #5   Title  Patient will report having BM's at least every-other day with consistency between [facility] stool scale 3-5 over the prior week to demonstrate decreased constipation.    Baseline  Having to strain to empty bowel or bladder, 2 year Hx. of constipation    Time  10    Period  Weeks    Status  Achieved            Plan - 04/29/19 1005    Clinical Impression Statement  Pt. Responded well to all interventions today, demonstrating decreased pain, improved TA recruitment, as well as understanding and correct performance of all education and exercises provided today. They will continue to benefit from skilled physical therapy to work  toward remaining goals and maximize function as well as decrease likelihood of symptom increase or recurrence.     PT Next Visit Plan  manual for scar, discuss long-term HEP and fitness plan, review pre-squeeze and sneeze, ask about discharge vs. SUI and difficulty emptying, Internal and TP release if necessary. D/C if good with plan for exercise.    PT Home Exercise Plan  hip ABD and EXT, bowel retraining, squatty potty, water intake, 3-way wall stretches, hip-flexor stretch, diaphragmatic breathing, cat-cow, Hip EXT and ABD in lying    Consulted and Agree with Plan of Care  Patient       Patient will benefit from skilled therapeutic intervention in order to improve the following deficits and impairments:     Visit Diagnosis: 1. Muscle weakness (generalized)   2. Other muscle spasm   3. Abnormal posture   4. Other lack of coordination        Problem List Patient Active Problem List   Diagnosis Date Noted  . Encounter for screening colonoscopy   . Hives 02/23/2019  . Vitamin D deficiency 02/01/2019  . Mood disorder ([place]) 12/24/2018  . HLD (hyperlipidemia) 09/01/2018  . History of prediabetes 09/01/2018  . Morbid obesity ([place]) 09/01/2018  . Chronic pain 08/31/2018  . Allergic rhinitis 08/31/2018  . S/P laparoscopic cholecystectomy April 2019 01/12/2018  . Angioedema 07/07/2017  . Bilateral carpal tunnel syndrome 11/03/2016  . Frequent PVCs 09/24/2016  . Morbid obesity with BMI of 40.0-44.9, adult ([place]) 08/07/2016  . Essential hypertension 07/10/2016  . OSA  on CPAP 07/10/2016   Willa Rough DPT, ATC Willa Rough 04/29/2019, 10:21 AM  [place] MAIN [facility] SERVICES [address] [place], [place], [address] Phone: [phone]   Fax:  [phone]  Name: Heather Sweeney MRN: [id number] Date of Birth: [birth year]

## 2019-05-04 ENCOUNTER — Ambulatory Visit: Payer: Medicare HMO

## 2019-05-05 ENCOUNTER — Encounter: Payer: Self-pay | Admitting: Podiatry

## 2019-05-05 ENCOUNTER — Other Ambulatory Visit: Payer: Self-pay

## 2019-05-05 ENCOUNTER — Ambulatory Visit (INDEPENDENT_AMBULATORY_CARE_PROVIDER_SITE_OTHER): Payer: Medicare HMO | Admitting: Podiatry

## 2019-05-05 VITALS — Temp 97.1°F

## 2019-05-05 DIAGNOSIS — B351 Tinea unguium: Secondary | ICD-10-CM

## 2019-05-05 DIAGNOSIS — M79676 Pain in unspecified toe(s): Secondary | ICD-10-CM

## 2019-05-05 NOTE — Progress Notes (Signed)
Complaint:  Visit Type: Patient returns to my office for continued preventative foot care services. Complaint: Patient states" my nails have grown long and thick and become painful to walk and wear shoes" Patient has been diagnosed with DM with no foot complications. The patient presents for preventative foot care services. No changes to ROS  Podiatric Exam: Vascular: dorsalis pedis and posterior tibial pulses are palpable bilateral. Capillary return is immediate. Temperature gradient is WNL. Skin turgor WNL  Sensorium: Normal Semmes Weinstein monofilament test. Normal tactile sensation bilaterally. Nail Exam: Pt has thick disfigured discolored nails with subungual debris noted bilateral entire nail hallux through fifth toenails Ulcer Exam: There is no evidence of ulcer or pre-ulcerative changes or infection. Orthopedic Exam: Muscle tone and strength are WNL. No limitations in general ROM. No crepitus or effusions noted. Foot type and digits show no abnormalities. Bony prominences are unremarkable. Skin: No Porokeratosis. No infection or ulcers  Diagnosis:  Onychomycosis, , Pain in right toe, pain in left toes  Treatment & Plan Procedures and Treatment: Consent by patient was obtained for treatment procedures.   Debridement of mycotic and hypertrophic toenails, 1 through 5 bilateral and clearing of subungual debris. No ulceration, no infection noted. Prescribe fungus solution. Return Visit-Office Procedure: Patient instructed to return to the office for a follow up visit 3 months for continued evaluation and treatment.    Gardiner Barefoot DPM

## 2019-05-09 ENCOUNTER — Other Ambulatory Visit: Payer: Self-pay

## 2019-05-09 DIAGNOSIS — L509 Urticaria, unspecified: Secondary | ICD-10-CM

## 2019-05-09 MED ORDER — HYDROXYZINE HCL 25 MG PO TABS: 25.0000 mg | ORAL_TABLET | Freq: Three times a day (TID) | ORAL | 3 refills | Status: DC | PRN

## 2019-05-10 ENCOUNTER — Telehealth: Payer: Self-pay | Admitting: Internal Medicine

## 2019-05-10 ENCOUNTER — Telehealth: Payer: Self-pay

## 2019-05-10 NOTE — Telephone Encounter (Signed)
Rx for Victoza is not listed in patient's chart.  Called and spoke to pt.  Pt says that Dr. Olivia Mackie knows about this situation. Pt says that Rx has been approved through [facility].  Pt said that Rx for Victoza should be sent to [facility] on [address]

## 2019-05-10 NOTE — Telephone Encounter (Signed)
Copied from [place] [id number]. Topic: General - Other >> May 10, 2019 10:22 AM Keene Breath wrote: Reason for CRM: Patient called to request that the nurse call her regarding a prescription for Victoza.  Patient stated that [facility] called her and said that she should call her doctor in order to get this filled.  Please advise.  CB# [phone], [place]

## 2019-05-10 NOTE — Telephone Encounter (Signed)
Medication Refill - Medication: Victoza 18mg /20ml injection  Has the patient contacted their pharmacy? Yes - just approved by her insurance and was filled by her old PCP. (Agent: If no, request that the patient contact the pharmacy for the refill.) (Agent: If yes, when and what did the pharmacy advise?)  Preferred Pharmacy (with phone number or street name):     [facility] ([facility]), [place] - [place] [phone] (Phone) [phone] (Fax)    Agent: Please be advised that RX refills may take up to 3 business days. We ask that you follow-up with your pharmacy.

## 2019-05-10 NOTE — Telephone Encounter (Signed)
She is not diabetic so medication is not rec for solely weight loss  Her insurance will not cover saxenda  And she cant take adipex for weight loss so we dont have options   If she wants to see a weight loss clinic I can refer her  What would she like?

## 2019-05-11 NOTE — Telephone Encounter (Signed)
I cant b/c she is not diabetic and this is used for diabetes  saxenda is the wt loss drug similar to victoza but her insurance will not cover this   So no other option for weight loss other than the clinic

## 2019-05-11 NOTE — Telephone Encounter (Signed)
I spoke with pt and relayed the message. Pt understood.

## 2019-05-11 NOTE — Telephone Encounter (Signed)
Spoken to patient. She doesn't want to weight loss clinic. She was told by pharmacy that all Dr has to do is sign off on victoza.

## 2019-05-12 ENCOUNTER — Other Ambulatory Visit: Payer: Self-pay

## 2019-05-12 ENCOUNTER — Ambulatory Visit (INDEPENDENT_AMBULATORY_CARE_PROVIDER_SITE_OTHER): Payer: Medicare HMO | Admitting: Internal Medicine

## 2019-05-12 ENCOUNTER — Ambulatory Visit: Payer: Medicare HMO | Attending: Oncology

## 2019-05-12 DIAGNOSIS — I10 Essential (primary) hypertension: Secondary | ICD-10-CM | POA: Diagnosis not present

## 2019-05-12 DIAGNOSIS — T783XXD Angioneurotic edema, subsequent encounter: Secondary | ICD-10-CM | POA: Diagnosis not present

## 2019-05-12 DIAGNOSIS — E538 Deficiency of other specified B group vitamins: Secondary | ICD-10-CM | POA: Diagnosis not present

## 2019-05-12 DIAGNOSIS — R739 Hyperglycemia, unspecified: Secondary | ICD-10-CM | POA: Diagnosis not present

## 2019-05-12 DIAGNOSIS — J309 Allergic rhinitis, unspecified: Secondary | ICD-10-CM

## 2019-05-12 DIAGNOSIS — N760 Acute vaginitis: Secondary | ICD-10-CM

## 2019-05-12 MED ORDER — AMLODIPINE BESYLATE 5 MG PO TABS: 5.0000 mg | ORAL_TABLET | Freq: Every day | ORAL | 3 refills | Status: DC

## 2019-05-12 MED ORDER — CETIRIZINE HCL 10 MG PO TABS: 10.0000 mg | ORAL_TABLET | Freq: Every day | ORAL | 3 refills | Status: DC

## 2019-05-12 MED ORDER — SPIRONOLACTONE 25 MG PO TABS: 12.5000 mg | ORAL_TABLET | Freq: Every day | ORAL | 3 refills | Status: DC

## 2019-05-12 NOTE — Progress Notes (Signed)
Telephone Note  I connected with Heather Sweeney  on 05/12/19 at 11:00 AM EDT by a telephone and verified that I am speaking with the correct person using two identifiers.  Location patient: home Location provider:work or home office Persons participating in the virtual visit: patient, provider  I discussed the limitations of evaluation and management by telemedicine and the availability of in person appointments. The patient expressed understanding and agreed to proceed.   HPI: 1. HTN not checking BP on spironolactone 12.5 mg qd, toprol xl 25, norvasc 5 mg  2. Obesity trying to lose wt not approved saxenda, could not tolerate adipex and victoza she is not diabetic  3. C/o vaginal odor new since going for pelvic PT which is helping urinary incontinence    ROS: See pertinent positives and negatives per HPI.  Past Medical History:  Diagnosis Date  . Allergic rhinitis   . Bipolar disorder ([place])   . Chronic low back pain   . CTS (carpal tunnel syndrome)    hands  . Depression   . Gallstones   . GERD (gastroesophageal reflux disease)   . History of prediabetes   . Hypertension   . Laceration of right foot   . Onychomycosis   . OSA on CPAP   . Overactive bladder   . PVC's (premature ventricular contractions)   . Seizures ([place])    8th grade    Past Surgical History:  Procedure Laterality Date  . ABDOMINAL HYSTERECTOMY  07/08/2011  . ABDOMINAL HYSTERECTOMY     supracervical in 07/2011   . CARPAL TUNNEL RELEASE     right hand 1996  . CESAREAN SECTION     2012  . CHOLECYSTECTOMY N/A 01/12/2018   Procedure: LAPAROSCOPIC CHOLECYSTECTOMY WITH INTRAOPERATIVE CHOLANGIOGRAM ERAS PATHWAY;  Surgeon: Johnathan Hausen, MD;  Location: [facility] ORS;  Service: General;  Laterality: N/A;  . COLONOSCOPY WITH PROPOFOL N/A 04/15/2019   Procedure: COLONOSCOPY WITH PROPOFOL;  Surgeon: Jonathon Bellows, MD;  Location: [facility] ENDOSCOPY;  Service: Gastroenterology;  Laterality: N/A;  . DILATION AND CURETTAGE OF  UTERUS     04/2011 benign endometrial polyp squamous metaplasia, inflamm, blood, mucous, benign   . FOOT SURGERY     Dr. Prudence Davidson   . LEEP     2006  . TUBAL LIGATION      Family History  Problem Relation Age of Onset  . Cancer Father        esophageal   . Hypertension Father   . Heart failure Mother   . Hypertension Mother   . Early death Mother   . Heart disease Mother        chf  . Breast cancer Sister 23  . Cancer Sister        breast  . Depression Sister   . Mental illness Sister   . Asthma Son   . Cancer Other        brain cancer    SOCIAL HX: marreid with kids    Current Outpatient Medications:  .  amLODipine (NORVASC) 5 MG tablet, Take 1 tablet (5 mg total) by mouth daily., Disp: 90 tablet, Rfl: 3 .  aspirin 81 MG EC tablet, Take 1 tablet (81 mg total) by mouth daily., Disp: 90 tablet, Rfl: 3 .  benztropine (COGENTIN) 1 MG tablet, Take 1 mg by mouth daily as needed (anxiety). , Disp: , Rfl:  .  camphor-menthol (SARNA) lotion, Apply 1 application topically as needed for itching., Disp: 222 mL, Rfl: 0 .  cetirizine (ZYRTEC) 10  MG tablet, Take 1 tablet (10 mg total) by mouth daily., Disp: 90 tablet, Rfl: 3 .  EPINEPHrine (EPIPEN 2-PAK) 0.3 mg/0.3 mL IJ SOAJ injection, Inject 0.3 mLs (0.3 mg total) into the muscle daily as needed (allergic reactions)., Disp: 2 Device, Rfl: 0 .  EPINEPHrine 0.3 mg/0.3 mL IJ SOAJ injection, Inject 0.3 mLs (0.3 mg total) into the muscle as needed for anaphylaxis., Disp: 1 Device, Rfl: 1 .  FLUoxetine (PROZAC) 20 MG capsule, Take 20 mg by mouth daily., Disp: , Rfl:  .  fluPHENAZine (PROLIXIN) 2.5 MG tablet, Take 2.5 mg by mouth at bedtime. , Disp: , Rfl:  .  fluticasone (FLONASE) 50 MCG/ACT nasal spray, Place 2 sprays into both nostrils daily., Disp: 16 g, Rfl: 11 .  gabapentin (NEURONTIN) 300 MG capsule, Take 1 capsule (300 mg total) by mouth 3 (three) times daily., Disp: 270 capsule, Rfl: 3 .  hydrocortisone 2.5 % cream, Apply topically 2  (two) times daily. Face for rash, Disp: 60 g, Rfl: 0 .  hydrOXYzine (ATARAX/VISTARIL) 25 MG tablet, Take 1 tablet (25 mg total) by mouth every 8 (eight) hours as needed for itching. Or itchy rash do not take with zyrtec at the same time, Disp: 90 tablet, Rfl: 3 .  meloxicam (MOBIC) 15 MG tablet, meloxicam 15 mg tablet  Take 1 tablet(s) every day by oral route with meals., Disp: , Rfl:  .  metoprolol succinate (TOPROL-XL) 25 MG 24 hr tablet, Take 1 tablet (25 mg total) by mouth daily. Take with or immediately following a meal., Disp: 90 tablet, Rfl: 3 .  oxyCODONE-acetaminophen (PERCOCET) 10-325 MG tablet, Take 1 tablet by mouth every 6 (six) hours as needed for pain. (Patient taking differently: Take 1 tablet by mouth every 8 (eight) hours as needed for pain (Dr. Mirna Mires pain). ), Disp: 30 tablet, Rfl: 0 .  pantoprazole (PROTONIX) 40 MG tablet, Take 1 tablet (40 mg total) by mouth daily., Disp: 90 tablet, Rfl: 3 .  polyethylene glycol (MIRALAX / GLYCOLAX) packet, Take 17 g by mouth daily as needed for mild constipation., Disp: , Rfl:  .  pravastatin (PRAVACHOL) 10 MG tablet, Take 1 tablet (10 mg total) by mouth daily., Disp: 90 tablet, Rfl: 3 .  predniSONE (DELTASONE) 10 MG tablet, Take 1 tablet (10 mg total) by mouth daily. Day 1-2: Take 50 mg  ( 5 pills) Day 3-4 : Take 40 mg (4pills) Day 5-6: Take 30 mg (3 pills) Day 7-8:  Take 20 mg (2 pills) Day 9:  Take 95m (1 pill), Disp: 29 tablet, Rfl: 0 .  spironolactone (ALDACTONE) 25 MG tablet, Take 0.5 tablets (12.5 mg total) by mouth daily., Disp: 45 tablet, Rfl: 3 .  triamcinolone cream (KENALOG) 0.1 %, Apply 1 application topically 2 (two) times daily. As needed to bumps on skin not on face, Disp: 454 g, Rfl: 0  EXAM:  VITALS per patient if applicable:  GENERAL: alert, oriented, appears well and in no acute distress PSYCH/NEURO: pleasant and cooperative, no obvious depression or anxiety, speech and thought processing grossly intact  ASSESSMENT AND  PLAN:  Discussed the following assessment and plan:  Allergic rhinitis, unspecified seasonality, unspecified trigger - Plan: cetirizine (ZYRTEC) 10 MG tablet, prn atarax for hives  Essential hypertension - Plan: amLODipine (NORVASC) 5 MG tablet, spironolactone (ALDACTONE) 25 MG tablet/12.5 mg qd, toprol 25 mg xl Comprehensive metabolic panel, CBC w/Diff, Lipid panel  Hyperglycemia - Plan: Hemoglobin A1c  B12 deficiency - Plan: Vitamin B12  Angioedema, subsequent encounter -  Plan: C1 Esterase Inhibitor, Functional  Acute vaginitis - Plan: Urine cytology ancillary only([place])  HM-physical at /fu   Declines flu shot allergic  Tdap utd due 04/18/2019  Allergic to pna vaccine Consider shingrix but given allergies use caution Not immune MMR, hep B titer in futureuse caution though with h/o allergies with all vaccines  h/o supracervical hysterectomy ([place]), pap 02/29/16 negative no comment HPV, another pap 06/23/17 neg pap neg HPV  -h/o LEEP 2006  mammo due referredprev and given # today to call to schedulepts states when COVID 19 over  Colonoscopy 04/15/19 normal f/u in 10 years  Echo Dr.Kowalski  -nl LV function EF 60% moderate mitral insuff and mild tricuspid insufficiency treadmill stress test neg 07/30/13    HIV neg 02/04/16  Ob/gyn appt pending bladder leakage I.e bladder sling vs other [facility] in PT    I discussed the assessment and treatment plan with the patient. The patient was provided an opportunity to ask questions and all were answered. The patient agreed with the plan and demonstrated an understanding of the instructions.   The patient was advised to call back or seek an in-person evaluation if the symptoms worsen or if the condition fails to improve as anticipated.  Time spent 15 minutes  Delorise Jackson, MD

## 2019-05-19 ENCOUNTER — Other Ambulatory Visit: Payer: Medicare HMO

## 2019-05-19 ENCOUNTER — Ambulatory Visit: Payer: Medicare HMO

## 2019-05-19 ENCOUNTER — Other Ambulatory Visit (HOSPITAL_COMMUNITY)
Admission: RE | Admit: 2019-05-19 | Discharge: 2019-05-19 | Disposition: A | Discharge status: Home / Self Care | Payer: Medicare HMO | Source: Ambulatory Visit | Attending: Internal Medicine | Admitting: Internal Medicine

## 2019-05-19 ENCOUNTER — Other Ambulatory Visit: Payer: Self-pay

## 2019-05-19 ENCOUNTER — Other Ambulatory Visit (INDEPENDENT_AMBULATORY_CARE_PROVIDER_SITE_OTHER): Payer: Medicare HMO

## 2019-05-19 DIAGNOSIS — N760 Acute vaginitis: Secondary | ICD-10-CM | POA: Insufficient documentation

## 2019-05-19 DIAGNOSIS — T783XXD Angioneurotic edema, subsequent encounter: Secondary | ICD-10-CM | POA: Diagnosis not present

## 2019-05-19 DIAGNOSIS — I10 Essential (primary) hypertension: Secondary | ICD-10-CM | POA: Diagnosis not present

## 2019-05-19 DIAGNOSIS — E538 Deficiency of other specified B group vitamins: Secondary | ICD-10-CM

## 2019-05-19 DIAGNOSIS — R739 Hyperglycemia, unspecified: Secondary | ICD-10-CM

## 2019-05-19 NOTE — Addendum Note (Signed)
Addended by: Leeanne [place] on: 05/19/2019 03:10 PM   Modules accepted: Orders

## 2019-05-22 LAB — C1 ESTERASE INHIBITOR, FUNCTIONAL: C1 Esterase Inhibitor Funct: 93 % (ref >=68)

## 2019-05-23 ENCOUNTER — Telehealth: Payer: Self-pay | Admitting: Internal Medicine

## 2019-05-23 NOTE — Telephone Encounter (Signed)
I called pt and left a vm to call ofc to [facility] fasting labs asap needs urine too

## 2019-05-24 ENCOUNTER — Telehealth: Payer: Self-pay | Admitting: *Deleted

## 2019-05-24 LAB — URINE CYTOLOGY ANCILLARY ONLY
Chlamydia: NEGATIVE
Neisseria Gonorrhea: NEGATIVE
Trichomonas: NEGATIVE

## 2019-05-24 NOTE — Telephone Encounter (Signed)
Copied from [place] [phone]. Topic: Quick Communication - See Telephone Encounter >> May 24, 2019  3:26 PM L[place] wrote: CRM for notification. See Telephone encounter for: 05/24/19. Pt is wanting a new C-Pap machine. Pt would like someone to call her. Lockwood is 53 yrs old

## 2019-05-24 NOTE — Telephone Encounter (Signed)
Patient said that her C-Pap machine is worn out and is spitting out water.  The order needs to go to [facility].  Telephone:  [phone]

## 2019-05-25 DIAGNOSIS — G4733 Obstructive sleep apnea (adult) (pediatric): Secondary | ICD-10-CM | POA: Diagnosis not present

## 2019-05-25 LAB — CBC WITH DIFFERENTIAL/PLATELET
Absolute Monocytes: 475 cells/uL (ref 200–950)
Basophils Absolute: 38 cells/uL (ref 0–200)
Basophils Relative: 0.7 %
Eosinophils Absolute: 211 cells/uL (ref 15–500)
Eosinophils Relative: 3.9 %
HCT: 33.2 % — ABNORMAL LOW (ref 35.0–45.0)
Hemoglobin: 11.5 g/dL — ABNORMAL LOW (ref 11.7–15.5)
Lymphs Abs: 2095 cells/uL (ref 850–3900)
MCH: 29.1 pg (ref 27.0–33.0)
MCHC: 34.6 g/dL (ref 32.0–36.0)
MCV: 84.1 fL (ref 80.0–100.0)
MPV: 11.7 fL (ref 7.5–12.5)
Monocytes Relative: 8.8 %
Neutro Abs: 2581 cells/uL (ref 1500–7800)
Neutrophils Relative %: 47.8 %
Platelets: 225 10*3/uL (ref 140–400)
RBC: 3.95 10*6/uL (ref 3.80–5.10)
RDW: 13.4 % (ref 11.0–15.0)
Total Lymphocyte: 38.8 %
WBC: 5.4 10*3/uL (ref 3.8–10.8)

## 2019-05-25 LAB — COMPREHENSIVE METABOLIC PANEL
AG Ratio: 1.5 (calc) (ref 1.0–2.5)
ALT: 16 U/L (ref 6–29)
AST: 18 U/L (ref 10–35)
Albumin: 4 g/dL (ref 3.6–5.1)
Alkaline phosphatase (APISO): 66 U/L (ref 37–153)
BUN: 9 mg/dL (ref 7–25)
CO2: 24 mmol/L (ref 20–32)
Calcium: 9 mg/dL (ref 8.6–10.4)
Chloride: 106 mmol/L (ref 98–110)
Creat: 1.01 mg/dL (ref 0.50–1.05)
Globulin: 2.6 g/dL (calc) (ref 1.9–3.7)
Glucose, Bld: 98 mg/dL (ref 65–99)
Potassium: 3.9 mmol/L (ref 3.5–5.3)
Sodium: 139 mmol/L (ref 135–146)
Total Bilirubin: 0.4 mg/dL (ref 0.2–1.2)
Total Protein: 6.6 g/dL (ref 6.1–8.1)

## 2019-05-25 LAB — LIPID PANEL
Cholesterol: 155 mg/dL (ref <200)
HDL: 47 mg/dL — ABNORMAL LOW (ref >=50)
LDL Cholesterol (Calc): 84 mg/dL (calc)
Non-HDL Cholesterol (Calc): 108 mg/dL (calc) (ref <130)
Total CHOL/HDL Ratio: 3.3 (calc) (ref <5.0)
Triglycerides: 144 mg/dL (ref <150)

## 2019-05-25 LAB — URINE CYTOLOGY ANCILLARY ONLY: Candida vaginitis: NEGATIVE

## 2019-05-25 LAB — HEMOGLOBIN A1C
Hgb A1c MFr Bld: 5.3 % of total Hgb (ref <5.7)
Mean Plasma Glucose: 105 (calc)
eAG (mmol/L): 5.8 (calc)

## 2019-05-25 LAB — C1 ESTERASE INHIBITOR, FUNCTIONAL

## 2019-05-25 LAB — VITAMIN B12: Vitamin B-12: 1034 pg/mL (ref 200–1100)

## 2019-05-26 ENCOUNTER — Other Ambulatory Visit: Payer: Self-pay | Admitting: Internal Medicine

## 2019-05-26 DIAGNOSIS — B9689 Other specified bacterial agents as the cause of diseases classified elsewhere: Secondary | ICD-10-CM

## 2019-05-26 MED ORDER — METRONIDAZOLE 500 MG PO TABS: 500.0000 mg | ORAL_TABLET | Freq: Two times a day (BID) | ORAL | 0 refills | Status: DC

## 2019-05-27 ENCOUNTER — Other Ambulatory Visit: Payer: Medicare HMO

## 2019-06-03 ENCOUNTER — Other Ambulatory Visit: Payer: Medicare HMO

## 2019-06-06 DIAGNOSIS — R21 Rash and other nonspecific skin eruption: Secondary | ICD-10-CM | POA: Diagnosis not present

## 2019-06-08 ENCOUNTER — Other Ambulatory Visit: Payer: Self-pay | Admitting: Internal Medicine

## 2019-06-08 ENCOUNTER — Telehealth: Payer: Self-pay | Admitting: Internal Medicine

## 2019-06-08 DIAGNOSIS — G4733 Obstructive sleep apnea (adult) (pediatric): Secondary | ICD-10-CM

## 2019-06-08 NOTE — Telephone Encounter (Signed)
[facility] is faxing copy of sleep study from 2017 today 06/08/2019  On 12 cm H20 ramp autoadjust  Will need to fax this back with Rx and  on cover to [facility] fax # [place] with pt case # [id number]   [place]

## 2019-06-08 NOTE — Telephone Encounter (Signed)
Found copy of sleep study via [facility] Romans was done 06/28/14 +OSA settings 12 cm h20 ramp autoadjust due for repeat sleep study  Referral to pulmonary   [facility] is faxing copy of sleep study from 2017 today 06/08/2019 appears sleep study was done 06/28/14 not in 2017   Results On 12 cm H20 ramp autoadjust  Will need to fax this back with Rx and  on cover to [facility] fax # [phone] [place] with pt case # [id number]  It has been almost 5 years and before order new machine needs referral to pulmonary for repeat sleep study due 06/29/2019  Referral placed    Portal

## 2019-06-08 NOTE — Telephone Encounter (Signed)
Change inform patient:  Found copy of sleep study via Heather Sweeney was done 06/28/14 +OSA settings 12 cm h20 ramp autoadjust due for repeat sleep study  Referral to [facility] to get new machine and retest every 5 years for sleep apnea   Sleep study results  Results On 12 cm H20 ramp autoadjust heated humidified fisher/paykel simplus full face medium mask

## 2019-06-08 NOTE — Telephone Encounter (Signed)
Where did she have the sleep study  [facility]?   I have called [facility] and they dont have a record of her sleep study  I need copy of her sleep study in order to order a new machine for her   1) She can try to call [facility] and get a copy of her sleep study and get to our clinic  2) She can call [facility] and let them know about problems with her machine and they may be able to help her without a doctors order  [place]

## 2019-06-09 ENCOUNTER — Ambulatory Visit (INDEPENDENT_AMBULATORY_CARE_PROVIDER_SITE_OTHER): Payer: Medicare HMO | Admitting: Family Medicine

## 2019-06-09 ENCOUNTER — Ambulatory Visit: Payer: Medicare HMO | Admitting: Family Medicine

## 2019-06-09 ENCOUNTER — Encounter: Payer: Self-pay | Admitting: *Deleted

## 2019-06-09 ENCOUNTER — Encounter: Payer: Self-pay | Admitting: Family Medicine

## 2019-06-09 ENCOUNTER — Other Ambulatory Visit: Payer: Self-pay

## 2019-06-09 VITALS — BP 102/76 | HR 84 | Ht 63.0 in | Wt 245.6 lb

## 2019-06-09 DIAGNOSIS — Z23 Encounter for immunization: Secondary | ICD-10-CM | POA: Diagnosis not present

## 2019-06-09 DIAGNOSIS — T304 Corrosion of unspecified body region, unspecified degree: Secondary | ICD-10-CM

## 2019-06-09 MED ORDER — SULFAMETHOXAZOLE-TRIMETHOPRIM 800-160 MG PO TABS: 1.0000 | ORAL_TABLET | Freq: Two times a day (BID) | ORAL | 0 refills | Status: DC

## 2019-06-09 MED ORDER — SILVER SULFADIAZINE 1 % EX CREA: 1.0000 "application " | TOPICAL_CREAM | Freq: Every day | CUTANEOUS | 0 refills | Status: DC

## 2019-06-09 NOTE — Telephone Encounter (Signed)
This encounter was created in error - please disregard.

## 2019-06-09 NOTE — Telephone Encounter (Signed)
Patient returned call for information regarding Dr. McLean-Scocuzza's note on her sleep study. Reviewed with patient a referral to [facility] for retesting in order to get a new machine.

## 2019-06-09 NOTE — Patient Instructions (Signed)
Cleanse hand with soap and water  Pat dry  Apply thin layer of SSD cream and wrap with gauze. If staying home and can rest hand; do not have to cover while cream absorbs. If leaving house, definitely wrap with gauze  Wear gloves when using bleach or cleaning supplies. Dilute concentrated bleach with water.

## 2019-06-09 NOTE — Progress Notes (Signed)
Subjective:    Patient ID: Gevena Sweeney, female    DOB: [birth year], 53 y.o.   MRN: [id number]  HPI  Patient presents to clinic due to chemical burn on right hand after using Clorox bleach.  Patient believes that the bleach she ended up using was not diluted and this is what caused chemical burn.  States she has redness and swelling on palm of hand and all fingers, has some open areas of skin on tops of fingers.  No fever or chills.  Areas that are open are not draining any pus or serous fluid.  States her right hand is sore due to the skin irritation from the Clorox.  Patient states she was not wearing a rubber glove when using the Clorox.  Patient Active Problem List   Diagnosis Date Noted  . Encounter for screening colonoscopy   . Hives 02/23/2019  . Vitamin D deficiency 02/01/2019  . Mood disorder ([place]) 12/24/2018  . HLD (hyperlipidemia) 09/01/2018  . History of prediabetes 09/01/2018  . Morbid obesity ([place]) 09/01/2018  . Chronic pain 08/31/2018  . Allergic rhinitis 08/31/2018  . S/P laparoscopic cholecystectomy April 2019 01/12/2018  . Angioedema 07/07/2017  . Bilateral carpal tunnel syndrome 11/03/2016  . Frequent PVCs 09/24/2016  . Morbid obesity with BMI of 40.0-44.9, adult ([place]) 08/07/2016  . Essential hypertension 07/10/2016  . OSA on CPAP 07/10/2016   Social History   Tobacco Use  . Smoking status: Never Smoker  . Smokeless tobacco: Current User    Types: Snuff  . Tobacco comment: Is in process of decreasing amount  Substance Use Topics  . Alcohol use: No    Alcohol/week: 0.0 standard drinks   Review of Systems   Constitutional: Negative for chills, fatigue and fever.  HENT: Negative for congestion, ear pain, sinus pain and sore throat.   Eyes: Negative.   Respiratory: Negative for cough, shortness of breath and wheezing.   Cardiovascular: Negative for chest pain, palpitations and leg swelling.  Gastrointestinal: Negative for abdominal pain, diarrhea,  nausea and vomiting.  Genitourinary: Negative for dysuria, frequency and urgency.  Musculoskeletal: Negative for arthralgias and myalgias.  Skin: +chemical burn from clorox bleach.  Neurological: Negative for syncope, light-headedness and headaches.  Psychiatric/Behavioral: The patient is not nervous/anxious.       Objective:   Physical Exam Vitals signs and nursing note reviewed.  Constitutional:      General: She is not in acute distress.    Appearance: She is not ill-appearing, toxic-appearing or diaphoretic.  Cardiovascular:     Rate and Rhythm: Normal rate and regular rhythm.  Pulmonary:     Effort: Pulmonary effort is normal. No respiratory distress.     Breath sounds: Normal breath sounds.  Musculoskeletal:       Hands:     Comments: Skin burn/irritated areas on right hand indicated by redmarks on diagram. Most areas are red, some small scabbed areas (about size of pin heads).  Hands wash with soap and water and patted dry.  Apply thin layer of bacitracin ointment to all fingers affected by the chemical burn.  Wrapped with gauze and secured with Coban.  Neurological:     General: No focal deficit present.     Mental Status: She is alert and oriented to person, place, and time.  Psychiatric:        Mood and Affect: Mood normal.        Behavior: Behavior normal.    Today's Vitals   06/09/19 1519  BP:  102/76  Pulse: 84  SpO2: 98%  Weight: 245 lb 9.6 oz (111.4 kg)  Height: 5\' 3"  (1.6 m)   Body mass index is 43.51 kg/m.     Assessment & Plan:    Chemical burn due to bleach-patient will change dressing on hand daily.  I have sent in prescription for SSD cream, advised to wash hands daily with soap and water apply thin layer of SSD cream on the affected burn areas of right hand and then wrapped hand with gauze and secured with Coban, dressing supplies provided to patient from office.  She will also take antibiotic course to cover for any sort of cellulitis infection  that could be chemical burn.  Advised to monitor self for worsening redness, increase in pain or drainage.  Strongly recommended wearing rubber gloves whenever using bleach and also recommended diluting bleach before use, advised most bleach comes concentrated and needs to be diluted with water.  Flu vaccine given in clinic  Patient will keep all regular follow-up with PCP as planned she will call office if any issues arise or hand does not prove as expected. Declines a scheduled follow up visit for hand re-check at this time, will call if needs [place].

## 2019-06-09 NOTE — Telephone Encounter (Signed)
Left message for patient to return call back. [facility] may give and obtain information.  

## 2019-06-10 ENCOUNTER — Ambulatory Visit: Payer: Medicare HMO | Admitting: Family Medicine

## 2019-06-16 ENCOUNTER — Institutional Professional Consult (permissible substitution): Payer: Medicare HMO | Admitting: Internal Medicine

## 2019-06-17 DIAGNOSIS — M545 Low back pain: Secondary | ICD-10-CM | POA: Diagnosis not present

## 2019-06-17 DIAGNOSIS — Z79891 Long term (current) use of opiate analgesic: Secondary | ICD-10-CM | POA: Diagnosis not present

## 2019-06-17 DIAGNOSIS — G894 Chronic pain syndrome: Secondary | ICD-10-CM | POA: Diagnosis not present

## 2019-06-28 DIAGNOSIS — F250 Schizoaffective disorder, bipolar type: Secondary | ICD-10-CM | POA: Diagnosis not present

## 2019-07-07 ENCOUNTER — Ambulatory Visit (INDEPENDENT_AMBULATORY_CARE_PROVIDER_SITE_OTHER): Payer: Medicare HMO

## 2019-07-07 ENCOUNTER — Other Ambulatory Visit: Payer: Self-pay

## 2019-07-07 DIAGNOSIS — Z0000 Encounter for general adult medical examination without abnormal findings: Secondary | ICD-10-CM | POA: Diagnosis not present

## 2019-07-07 DIAGNOSIS — N760 Acute vaginitis: Secondary | ICD-10-CM | POA: Diagnosis not present

## 2019-07-07 DIAGNOSIS — B9689 Other specified bacterial agents as the cause of diseases classified elsewhere: Secondary | ICD-10-CM

## 2019-07-07 MED ORDER — METRONIDAZOLE 0.75 % VA GEL: 1.0000 | Freq: Two times a day (BID) | VAGINAL | 0 refills | Status: DC

## 2019-07-07 NOTE — Progress Notes (Addendum)
Subjective:   Heather Sweeney is a 53 y.o. female who presents for an Initial Medicare Annual Wellness Visit.  Review of Systems    No ROS.  Medicare Wellness Virtual Visit.  Visual/audio telehealth visit, UTA vital signs.   See social history for additional risk factors.    Cardiac Risk Factors include: hypertension     Objective:    Today's Vitals   There is no height or weight on file to calculate BMI.  Advanced Directives 07/07/2019 04/15/2019 03/03/2019 02/19/2019 03/30/2018 03/11/2018 01/12/2018  Does Patient Have a Medical Advance Directive? No No No No No No No  Would patient like information on creating a medical advance directive? No - Patient declined - Yes (MAU/Ambulatory/Procedural Areas - Information given) - - No - Patient declined Yes (MAU/Ambulatory/Procedural Areas - Information given)    Current Medications (verified) Outpatient Encounter Medications as of 07/07/2019  Medication Sig  . amLODipine (NORVASC) 5 MG tablet Take 1 tablet (5 mg total) by mouth daily.  . benztropine (COGENTIN) 1 MG tablet Take 1 mg by mouth daily as needed (anxiety).   . camphor-menthol (SARNA) lotion Apply 1 application topically as needed for itching.  . cetirizine (ZYRTEC) 10 MG tablet Take 1 tablet (10 mg total) by mouth daily.  Marland Kitchen EPINEPHrine 0.3 mg/0.3 mL IJ SOAJ injection Inject 0.3 mLs (0.3 mg total) into the muscle as needed for anaphylaxis.  Marland Kitchen FLUoxetine (PROZAC) 20 MG capsule Take 20 mg by mouth daily.  . fluticasone (FLONASE) 50 MCG/ACT nasal spray Place 2 sprays into both nostrils daily.  Marland Kitchen gabapentin (NEURONTIN) 300 MG capsule Take 1 capsule (300 mg total) by mouth 3 (three) times daily.  . hydrocortisone 2.5 % cream Apply topically 2 (two) times daily. Face for rash  . hydrOXYzine (ATARAX/VISTARIL) 25 MG tablet Take 1 tablet (25 mg total) by mouth every 8 (eight) hours as needed for itching. Or itchy rash do not take with zyrtec at the same time  . metFORMIN (GLUCOPHAGE) 500  MG tablet Take 500 mg by mouth daily with breakfast.  . metoprolol succinate (TOPROL-XL) 25 MG 24 hr tablet Take 1 tablet (25 mg total) by mouth daily. Take with or immediately following a meal.  . multivitamin-lutein (OCUVITE-LUTEIN) CAPS capsule Take 1 capsule by mouth daily.  Marland Kitchen oxyCODONE-acetaminophen (PERCOCET) 10-325 MG tablet Take 1 tablet by mouth every 6 (six) hours as needed for pain. (Patient taking differently: Take 1 tablet by mouth every 8 (eight) hours as needed for pain (Dr. Mirna Mires pain). )  . pantoprazole (PROTONIX) 40 MG tablet Take 1 tablet (40 mg total) by mouth daily.  . polyethylene glycol (MIRALAX / GLYCOLAX) packet Take 17 g by mouth daily as needed for mild constipation.  . pravastatin (PRAVACHOL) 10 MG tablet Take 1 tablet (10 mg total) by mouth daily.  . silver sulfADIAZINE (SSD) 1 % cream Apply 1 application topically daily.  Marland Kitchen spironolactone (ALDACTONE) 25 MG tablet Take 0.5 tablets (12.5 mg total) by mouth daily.  Marland Kitchen triamcinolone cream (KENALOG) 0.1 % Apply 1 application topically 2 (two) times daily. As needed to bumps on skin not on face  . fluPHENAZine (PROLIXIN) 2.5 MG tablet Take 2.5 mg by mouth at bedtime.   . meloxicam (MOBIC) 15 MG tablet meloxicam 15 mg tablet  Take 1 tablet(s) every day by oral route with meals.  Marland Kitchen sulfamethoxazole-trimethoprim (BACTRIM DS) 800-160 MG tablet Take 1 tablet by mouth 2 (two) times daily.  . [DISCONTINUED] EPINEPHrine (EPIPEN 2-PAK) 0.3 mg/0.3 mL IJ SOAJ  injection Inject 0.3 mLs (0.3 mg total) into the muscle daily as needed (allergic reactions).   No facility-administered encounter medications on file as of 07/07/2019.     Allergies (verified) Cephalexin, Hctz [hydrochlorothiazide], Lisinopril, Losartan, Penicillins, Bee venom, and Metformin and related   History: Past Medical History:  Diagnosis Date  . Allergic rhinitis   . Bipolar disorder ([place])   . Chronic low back pain   . CTS (carpal tunnel syndrome)    hands  .  Depression   . Gallstones   . GERD (gastroesophageal reflux disease)   . History of prediabetes   . Hypertension   . Laceration of right foot   . Onychomycosis   . OSA on CPAP   . Overactive bladder   . PVC's (premature ventricular contractions)   . Seizures ([place])    8th grade   Past Surgical History:  Procedure Laterality Date  . ABDOMINAL HYSTERECTOMY  07/08/2011  . ABDOMINAL HYSTERECTOMY     supracervical in 07/2011   . CARPAL TUNNEL RELEASE     right hand 1996  . CESAREAN SECTION     2012  . CHOLECYSTECTOMY N/A 01/12/2018   Procedure: LAPAROSCOPIC CHOLECYSTECTOMY WITH INTRAOPERATIVE CHOLANGIOGRAM ERAS PATHWAY;  Surgeon: Johnathan Hausen, MD;  Location: [facility];  Service: General;  Laterality: N/A;  . COLONOSCOPY WITH PROPOFOL N/A 04/15/2019   Procedure: COLONOSCOPY WITH PROPOFOL;  Surgeon: Jonathon Bellows, MD;  Location: [facility] ENDOSCOPY;  Service: Gastroenterology;  Laterality: N/A;  . DILATION AND CURETTAGE OF UTERUS     04/2011 benign endometrial polyp squamous metaplasia, inflamm, blood, mucous, benign   . FOOT SURGERY     Dr. Prudence Davidson   . LEEP     2006  . TUBAL LIGATION     Family History  Problem Relation Age of Onset  . Cancer Father        esophageal   . Hypertension Father   . Heart failure Mother   . Hypertension Mother   . Early death Mother   . Heart disease Mother        chf  . Breast cancer Sister 13  . Cancer Sister        breast  . Depression Sister   . Mental illness Sister   . Asthma Son   . Cancer Other        brain cancer   Social History   Socioeconomic History  . Marital status: Married    Spouse name: Not on file  . Number of children: Not on file  . Years of education: Not on file  . Highest education level: Not on file  Occupational History  . Not on file  Social Needs  . Financial resource strain: Not hard at all  . Food insecurity    Worry: Never true    Inability: Never true  . Transportation needs    Medical: No    Non-medical:  No  Tobacco Use  . Smoking status: Never Smoker  . Smokeless tobacco: Current User    Types: Snuff  . Tobacco comment: Is in process of decreasing amount  Substance and Sexual Activity  . Alcohol use: No    Alcohol/week: 0.0 standard drinks  . Drug use: No  . Sexual activity: Yes    Comment: men  Lifestyle  . Physical activity    Days per week: 3 days    Minutes per session: 30 min  . Stress: Not at all  Relationships  . Social Herbalist on  phone: Not on file    Gets together: Not on file    Attends religious service: Not on file    Active member of club or organization: Not on file    Attends meetings of clubs or organizations: Not on file    Relationship status: Not on file  Other Topics Concern  . Not on file  Social History Narrative   Married    No guns    Wears seat belt    Safe in relationship    Never smoker    2 sons Gaspar Bidding ~8 and another son ~26 as of 08/2018     2 sisters    Unemployed        Tobacco Counseling Ready to quit: Not Answered Counseling given: Not Answered Comment: Is in process of decreasing amount   Clinical Intake:  Pre-visit preparation completed: Yes        Diabetes: No  How often do you need to have someone help you when you read instructions, pamphlets, or other written materials from your doctor or pharmacy?: 1 - Never  Interpreter Needed?: No      Activities of Daily Living In your present state of health, do you have any difficulty performing the following activities: 07/07/2019 06/09/2019  Hearing? N N  Vision? N N  Difficulty concentrating or making decisions? N N  Walking or climbing stairs? N N  Dressing or bathing? N N  Doing errands, shopping? N N  Preparing Food and eating ? N -  Using the Toilet? N -  In the past six months, have you accidently leaked urine? N -  Do you have problems with loss of bowel control? N -  Managing your Medications? N -  Managing your Finances? N -  Housekeeping or  managing your Housekeeping? N -  Some recent data might be hidden     Immunizations and Health Maintenance Immunization History  Administered Date(s) Administered  . Influenza,inj,Quad PF,6+ Mos 07/12/2016, 06/09/2019  . Pneumococcal Polysaccharide-23 07/12/2016  . Tdap 04/17/2009   Health Maintenance Due  Topic Date Due  . MAMMOGRAM  02/19/2019    Patient Care Team: McLean-Scocuzza, Nino Glow, MD as PCP - General (Internal Medicine)  Indicate any recent Medical Services you may have received from other than [facility] providers in the past year (date may be approximate).     Assessment:   This is a routine wellness examination for Jenesis.  I connected with patient 07/07/19 at 11:00 AM EDT by an audio enabled telemedicine application and verified that I am speaking with the correct person using two identifiers. Patient stated full name and DOB. Patient gave permission to continue with virtual visit. Patient's location was at home and Nurse's location was at [place] office.   Health Maintenance Due: Mammogram- discussed; ordered 08/31/18 See completed HM at the end of note.   Eye: Visual acuity not assessed. Virtual visit. Wears corrective lenses.   Dental: Dentures- yes  Hearing: Demonstrates normal hearing during visit.  Safety:  Patient feels safe at home- yes Patient does have smoke detectors at home- yes Patient does wear sunscreen or protective clothing when in direct sunlight - yes Patient does wear seat belt when in a moving vehicle - yes Patient drives- yes Adequate lighting in walkways free from debris- yes Grab bars and handrails used as appropriate- yes Ambulates with no assistive device Cell phone on person when ambulating outside of the home- yes  Social: Alcohol intake - no  Smoking history- never   Tobacco use in home? self, smokeless (snuff) Illicit drug use? none  Depression: PHQ 2 &9 complete. See screening below. Denies irritability,  anhedonia, sadness/tearfullness.  Stable.   Falls: See screening below.    Medication: Taking as directed and without issues.   Covid-19: Precautions and sickness symptoms discussed. Wears mask, social distancing, hand hygiene as appropriate.   Activities of Daily Living Patient denies needing assistance with: household chores, feeding themselves, getting from bed to chair, getting to the toilet, bathing/showering, dressing, managing money, or preparing meals.   Memory: Patient is alert. Patient denies difficulty focusing or concentrating. Correctly identified the president of the [place], season and recall.  BMI- discussed the importance of a healthy diet, water intake and the benefits of aerobic exercise.  Educational material provided.  Physical activity- Stationary bicycle 20 -30 minutes, no routine  Diet: Regular Water: good intake  Other Providers Patient Care Team: McLean-Scocuzza, Nino Glow, MD as PCP - General (Internal Medicine)  Hearing/Vision screen  Hearing Screening   125Hz  250Hz  500Hz  1000Hz  2000Hz  3000Hz  4000Hz  6000Hz  8000Hz   Right ear:           Left ear:           Comments: Patient is able to hear conversational tones without difficulty.  No issues reported.   Vision Screening Comments: Visual acuity not assessed, virtual visit.       Dietary issues and exercise activities discussed: Current Exercise Habits: Home exercise routine, Type of exercise: calisthenics, Time (Minutes): 20, Intensity: Mild  Goals    . Follow up with Primary Care Provider      Depression Screen PHQ 2/9 Scores 07/07/2019 05/12/2019 08/31/2018 03/11/2018  PHQ - 2 Score 0 0 0 0    Fall Risk Fall Risk  07/07/2019 05/12/2019 08/31/2018 03/11/2018  Falls in the past year? 0 0 0 No   Timed Get Up and Go Performed no, virtual visit  Cognitive Function:     6CIT Screen 07/07/2019  What Year? 0 points  What month? 0 points  What time? 0 points  Count back from 20 0 points  Months in  reverse 0 points    Screening Tests Health Maintenance  Topic Date Due  . MAMMOGRAM  02/19/2019  . TETANUS/TDAP  07/06/2020 (Originally 04/18/2019)  . URINE MICROALBUMIN  02/24/2020  . PAP SMEAR-Modifier  06/23/2020  . COLONOSCOPY  04/14/2029  . INFLUENZA VACCINE  Completed  . HIV Screening  Completed     Plan:   Keep all routine maintenance appointments.   Follow up 07/08/19 DOXY @ 3:30 regarding restarting victoza medication and continued odor after completing bactrim.  Follow up 08/19/19   Medicare Attestation I have personally reviewed: The patient's medical and social history Their use of alcohol, tobacco or illicit drugs Their current medications and supplements The patient's functional ability including ADLs,fall risks, home safety risks, cognitive, and hearing and visual impairment Diet and physical activities Evidence for depression   In addition, I have reviewed and discussed with patient certain preventive protocols, quality metrics, and best practice recommendations. A written personalized care plan for preventive services as well as general preventive health recommendations were provided to patient via mail.     OBrien-Blaney, Salaya Holtrop L, LPN   [id number]    She cant use Victoza due to she is not diabetic as we have discussed  Will refer to wt loss clinic in future if shes interested   [place]

## 2019-07-07 NOTE — Addendum Note (Signed)
Addended by: Orland Mustard on: 07/07/2019 01:17 PM   Modules accepted: Orders

## 2019-07-07 NOTE — Progress Notes (Signed)
She cant use Victoza due to she is not diabetic as we have discussed  Will refer to wt loss clinic in future if shes interested   Sent topical flagyl to use for bacterial vaginosis    TMS

## 2019-07-07 NOTE — Patient Instructions (Signed)
  Heather Sweeney , Thank you for taking time to come for your Medicare Wellness Visit. I appreciate your ongoing commitment to your health goals. Please review the following plan we discussed and let me know if I can assist you in the future.   These are the goals we discussed: Goals    . Follow up with Primary Care Provider       This is a list of the screening recommended for you and due dates:  Health Maintenance  Topic Date Due  . Mammogram  02/19/2019  . Tetanus Vaccine  04/18/2019  . Urine Protein Check  02/24/2020  . Pap Smear  06/23/2020  . Colon Cancer Screening  04/14/2029  . Flu Shot  Completed  . HIV Screening  Completed

## 2019-07-08 ENCOUNTER — Ambulatory Visit (INDEPENDENT_AMBULATORY_CARE_PROVIDER_SITE_OTHER): Payer: Medicare HMO | Admitting: Internal Medicine

## 2019-07-08 ENCOUNTER — Other Ambulatory Visit: Payer: Self-pay

## 2019-07-08 VITALS — Ht 63.0 in | Wt 245.6 lb

## 2019-07-08 DIAGNOSIS — I10 Essential (primary) hypertension: Secondary | ICD-10-CM | POA: Diagnosis not present

## 2019-07-08 MED ORDER — PHENTERMINE HCL 37.5 MG PO TABS: 18.7500 mg | ORAL_TABLET | Freq: Every day | ORAL | 0 refills | Status: DC

## 2019-07-08 NOTE — Progress Notes (Signed)
Telephone  Note   I connected with Heather Sweeney  on 07/08/19 at  3:30 PM EDT by telephone and verified that I am speaking with the correct person using two identifiers.  Location patient: home Location provider:work or home office Persons participating in the virtual visit: patient, provider  I discussed the limitations of evaluation and management by telemedicine and the availability of in person appointments. The patient expressed understanding and agreed to proceed.   HPI: 1. HTN no checking BP meter broken on toprol xl 25 mg qd, spironolactone 12.5 mg qd  2 BV given topical metronidazole  3. Chemical burn better   ROS: See pertinent positives and negatives per HPI.  Past Medical History:  Diagnosis Date  . Allergic rhinitis   . Bipolar disorder ([facility])   . Chronic low back pain   . CTS (carpal tunnel syndrome)    hands  . Depression   . Gallstones   . GERD (gastroesophageal reflux disease)   . History of prediabetes   . Hypertension   . Laceration of right foot   . Onychomycosis   . OSA on CPAP   . Overactive bladder   . PVC's (premature ventricular contractions)   . Seizures ([facility])    8th grade    Past Surgical History:  Procedure Laterality Date  . ABDOMINAL HYSTERECTOMY  07/08/2011  . ABDOMINAL HYSTERECTOMY     supracervical in 07/2011   . CARPAL TUNNEL RELEASE     right hand 1996  . CESAREAN SECTION     2012  . CHOLECYSTECTOMY N/A 01/12/2018   Procedure: LAPAROSCOPIC CHOLECYSTECTOMY WITH INTRAOPERATIVE CHOLANGIOGRAM ERAS PATHWAY;  Surgeon: Martin, Matthew, MD;  Location: [facility];  Service: General;  Laterality: N/A;  . COLONOSCOPY WITH PROPOFOL N/A 04/15/2019   Procedure: COLONOSCOPY WITH PROPOFOL;  Surgeon: Anna, Kiran, MD;  Location: [facility] ENDOSCOPY;  Service: Gastroenterology;  Laterality: N/A;  . DILATION AND CURETTAGE OF UTERUS     04/2011 benign endometrial polyp squamous metaplasia, inflamm, blood, mucous, benign   . FOOT SURGERY     Dr. Mayer   . LEEP      2006  . TUBAL LIGATION      Family History  Problem Relation Age of Onset  . Cancer Father        esophageal   . Hypertension Father   . Heart failure Mother   . Hypertension Mother   . Early death Mother   . Heart disease Mother        chf  . Breast cancer Sister 50  . Cancer Sister        breast  . Depression Sister   . Mental illness Sister   . Asthma Son   . Cancer Other        brain cancer    SOCIAL HX:  Married  No guns  Wears seat belt  Safe in relationship  Never smoker  2 sons Bryan ~8 and another son ~26 as of 08/2018   2 sisters  Unemployed     Current Outpatient Medications:  .  amLODipine (NORVASC) 5 MG tablet, Take 1 tablet (5 mg total) by mouth daily., Disp: 90 tablet, Rfl: 3 .  benztropine (COGENTIN) 1 MG tablet, Take 1 mg by mouth daily as needed (anxiety). , Disp: , Rfl:  .  camphor-menthol (SARNA) lotion, Apply 1 application topically as needed for itching., Disp: 222 mL, Rfl: 0 .  cetirizine (ZYRTEC) 10 MG tablet, Take 1 tablet (10 mg total) by mouth daily., Disp:   90 tablet, Rfl: 3 .  EPINEPHrine 0.3 mg/0.3 mL IJ SOAJ injection, Inject 0.3 mLs (0.3 mg total) into the muscle as needed for anaphylaxis., Disp: 1 Device, Rfl: 1 .  FLUoxetine (PROZAC) 20 MG capsule, Take 20 mg by mouth daily., Disp: , Rfl:  .  fluPHENAZine (PROLIXIN) 2.5 MG tablet, Take 2.5 mg by mouth at bedtime. , Disp: , Rfl:  .  fluticasone (FLONASE) 50 MCG/ACT nasal spray, Place 2 sprays into both nostrils daily., Disp: 16 g, Rfl: 11 .  gabapentin (NEURONTIN) 300 MG capsule, Take 1 capsule (300 mg total) by mouth 3 (three) times daily., Disp: 270 capsule, Rfl: 3 .  hydrocortisone 2.5 % cream, Apply topically 2 (two) times daily. Face for rash, Disp: 60 g, Rfl: 0 .  hydrOXYzine (ATARAX/VISTARIL) 25 MG tablet, Take 1 tablet (25 mg total) by mouth every 8 (eight) hours as needed for itching. Or itchy rash do not take with zyrtec at the same time, Disp: 90 tablet, Rfl: 3 .   meloxicam (MOBIC) 15 MG tablet, meloxicam 15 mg tablet  Take 1 tablet(s) every day by oral route with meals., Disp: , Rfl:  .  metFORMIN (GLUCOPHAGE) 500 MG tablet, Take 500 mg by mouth daily with breakfast., Disp: , Rfl:  .  metoprolol succinate (TOPROL-XL) 25 MG 24 hr tablet, Take 1 tablet (25 mg total) by mouth daily. Take with or immediately following a meal., Disp: 90 tablet, Rfl: 3 .  metroNIDAZOLE (METROGEL) 0.75 % vaginal gel, Place 1 Applicatorful vaginally 2 (two) times daily. X 7-10 days, Disp: 70 g, Rfl: 0 .  multivitamin-lutein (OCUVITE-LUTEIN) CAPS capsule, Take 1 capsule by mouth daily., Disp: , Rfl:  .  oxyCODONE-acetaminophen (PERCOCET) 10-325 MG tablet, Take 1 tablet by mouth every 6 (six) hours as needed for pain. (Patient taking differently: Take 1 tablet by mouth every 8 (eight) hours as needed for pain (Dr. Mirna Mires pain). ), Disp: 30 tablet, Rfl: 0 .  pantoprazole (PROTONIX) 40 MG tablet, Take 1 tablet (40 mg total) by mouth daily., Disp: 90 tablet, Rfl: 3 .  polyethylene glycol (MIRALAX / GLYCOLAX) packet, Take 17 g by mouth daily as needed for mild constipation., Disp: , Rfl:  .  pravastatin (PRAVACHOL) 10 MG tablet, Take 1 tablet (10 mg total) by mouth daily., Disp: 90 tablet, Rfl: 3 .  silver sulfADIAZINE (SSD) 1 % cream, Apply 1 application topically daily., Disp: 50 g, Rfl: 0 .  spironolactone (ALDACTONE) 25 MG tablet, Take 0.5 tablets (12.5 mg total) by mouth daily., Disp: 45 tablet, Rfl: 3 .  sulfamethoxazole-trimethoprim (BACTRIM DS) 800-160 MG tablet, Take 1 tablet by mouth 2 (two) times daily., Disp: 14 tablet, Rfl: 0 .  triamcinolone cream (KENALOG) 0.1 %, Apply 1 application topically 2 (two) times daily. As needed to bumps on skin not on face, Disp: 454 g, Rfl: 0 .  phentermine (ADIPEX-P) 37.5 MG tablet, Take 0.5-1 tablets (18.75-37.5 mg total) by mouth daily before breakfast., Disp: 60 tablet, Rfl: 0  EXAM:  VITALS per patient if applicable:  GENERAL: alert,  oriented, appears well and in no acute distress  PSYCH/NEURO: pleasant and cooperative, no obvious depression or anxiety, speech and thought processing grossly intact  ASSESSMENT AND PLAN:  Discussed the following assessment and plan:  Essential hypertension -encouraged to by BP cuff   Morbid obesity (Heather Sweeney) - Plan: phentermine (ADIPEX-P) 1/2 to 1 (37.5) MG tablet  HM Declines flu shotallergic Tdap utd due 04/18/2019  Allergic to pna vaccine Consider shingrix but given allergies  use caution Not immuneMMR, hep B titer in futureuse caution though with h/o allergies with all vaccines  h/o supracervical hysterectomy ([facility]), pap 02/29/16 negative no comment HPV, another pap 06/23/17 neg pap neg HPV  -h/o LEEP 2006  mammo due referredprev and given # today to call to schedulepts states when COVID 19 over  Colonoscopy 04/15/19 normal f/u in 10 years  Echo Dr.Kowalski  -nl LV function EF 60% moderate mitral insuff and mild tricuspid insufficiency treadmill stress test neg 07/30/13    HIV neg 02/04/16  Ob/gyn appt pending bladder leakage I.e bladder sling vs other [facility] in PT  -we discussed possible serious and likely etiologies, options for evaluation and workup, limitations of telemedicine visit vs in person visit, treatment, treatment risks and precautions. Pt prefers to treat via telemedicine empirically rather then risking or undertaking an in person visit at this moment. Patient agrees to seek prompt in person care if worsening, new symptoms arise, or if is not improving with treatment.   I discussed the assessment and treatment plan with the patient. The patient was provided an opportunity to ask questions and all were answered. The patient agreed with the plan and demonstrated an understanding of the instructions.   The patient was advised to call back or seek an in-person evaluation if the symptoms worsen or if the condition fails to improve as anticipated.  Time  spent 15 minutes  Tracy N McLean-Scocuzza, MD  

## 2019-07-18 ENCOUNTER — Telehealth: Payer: Self-pay

## 2019-07-18 NOTE — Telephone Encounter (Signed)
Unable to leave a message for patient to return call back. PEC maygive results and obtain information.

## 2019-07-18 NOTE — Telephone Encounter (Signed)
-----   Message from Delorise Jackson, MD sent at 07/07/2019  1:17 PM EDT -----   ----- Message ----- From: Dia Crawford, LPN Sent: [id number]  12:58 PM EDT To: [name], MD  Follow up doxy 07/08/19 @ 3:30 regarding restarting victoza medication and continued odor after completing bactrim.

## 2019-08-04 ENCOUNTER — Ambulatory Visit
Admission: RE | Admit: 2019-08-04 | Discharge: 2019-08-04 | Disposition: A | Discharge status: Home / Self Care | Payer: Medicare HMO | Source: Ambulatory Visit | Attending: Internal Medicine | Admitting: Internal Medicine

## 2019-08-04 ENCOUNTER — Ambulatory Visit (INDEPENDENT_AMBULATORY_CARE_PROVIDER_SITE_OTHER): Payer: Medicare HMO | Admitting: Podiatry

## 2019-08-04 ENCOUNTER — Encounter (INDEPENDENT_AMBULATORY_CARE_PROVIDER_SITE_OTHER): Payer: Self-pay

## 2019-08-04 ENCOUNTER — Ambulatory Visit: Payer: Medicare HMO | Admitting: Podiatry

## 2019-08-04 ENCOUNTER — Encounter: Payer: Self-pay | Admitting: Podiatry

## 2019-08-04 ENCOUNTER — Other Ambulatory Visit: Payer: Self-pay

## 2019-08-04 DIAGNOSIS — M79676 Pain in unspecified toe(s): Secondary | ICD-10-CM

## 2019-08-04 DIAGNOSIS — B351 Tinea unguium: Secondary | ICD-10-CM

## 2019-08-04 DIAGNOSIS — Z1231 Encounter for screening mammogram for malignant neoplasm of breast: Secondary | ICD-10-CM | POA: Diagnosis not present

## 2019-08-04 NOTE — Progress Notes (Signed)
Complaint:  Visit Type: Patient returns to my office for continued preventative foot care services. Complaint: Patient states" my nails have grown long and thick and become painful to walk and wear shoes" . The patient presents for preventative foot care services. No changes to ROS  Podiatric Exam: Vascular: dorsalis pedis and posterior tibial pulses are palpable bilateral. Capillary return is immediate. Temperature gradient is WNL. Skin turgor WNL  Sensorium: Normal Semmes Weinstein monofilament test. Normal tactile sensation bilaterally. Nail Exam: Pt has thick disfigured discolored nails with subungual debris noted bilateral entire nail hallux through fifth toenails Ulcer Exam: There is no evidence of ulcer or pre-ulcerative changes or infection. Orthopedic Exam: Muscle tone and strength are WNL. No limitations in general ROM. No crepitus or effusions noted. Foot type and digits show no abnormalities. Bony prominences are unremarkable. Skin: No Porokeratosis. No infection or ulcers  Diagnosis:  Onychomycosis, , Pain in right toe, pain in left toes  Treatment & Plan Procedures and Treatment: Consent by patient was obtained for treatment procedures.   Debridement of mycotic and hypertrophic toenails, 1 through 5 bilateral and clearing of subungual debris. No ulceration, no infection noted.  Return Visit-Office Procedure: Patient instructed to return to the office for a follow up visit 3 months for continued evaluation and treatment.    Sumiko Ceasar DPM 

## 2019-08-15 DIAGNOSIS — G894 Chronic pain syndrome: Secondary | ICD-10-CM | POA: Diagnosis not present

## 2019-08-15 DIAGNOSIS — Z79891 Long term (current) use of opiate analgesic: Secondary | ICD-10-CM | POA: Diagnosis not present

## 2019-08-15 DIAGNOSIS — M545 Low back pain: Secondary | ICD-10-CM | POA: Diagnosis not present

## 2019-08-17 ENCOUNTER — Other Ambulatory Visit: Payer: Self-pay

## 2019-08-18 ENCOUNTER — Ambulatory Visit: Payer: Medicare HMO | Admitting: Internal Medicine

## 2019-08-19 ENCOUNTER — Ambulatory Visit (INDEPENDENT_AMBULATORY_CARE_PROVIDER_SITE_OTHER): Payer: Medicare HMO | Admitting: Internal Medicine

## 2019-08-19 ENCOUNTER — Other Ambulatory Visit: Payer: Self-pay

## 2019-08-19 ENCOUNTER — Encounter: Payer: Self-pay | Admitting: Internal Medicine

## 2019-08-19 VITALS — BP 122/68 | HR 89 | Temp 98.0°F | Ht 63.0 in | Wt 246.0 lb

## 2019-08-19 DIAGNOSIS — I10 Essential (primary) hypertension: Secondary | ICD-10-CM

## 2019-08-19 DIAGNOSIS — N760 Acute vaginitis: Secondary | ICD-10-CM | POA: Diagnosis not present

## 2019-08-19 DIAGNOSIS — B351 Tinea unguium: Secondary | ICD-10-CM | POA: Diagnosis not present

## 2019-08-19 MED ORDER — FLUCONAZOLE 150 MG PO TABS: 150.0000 mg | ORAL_TABLET | Freq: Once | ORAL | 0 refills | Status: AC

## 2019-08-19 MED ORDER — TERBINAFINE HCL 250 MG PO TABS: 250.0000 mg | ORAL_TABLET | Freq: Every day | ORAL | 0 refills | Status: DC

## 2019-08-19 MED ORDER — CICLOPIROX 8 % EX KIT: 1.0000 "application " | PACK | Freq: Every day | CUTANEOUS | 12 refills | Status: DC

## 2019-08-19 MED ORDER — SAXENDA 18 MG/3ML ~~LOC~~ SOPN: 0.6000 mg | PEN_INJECTOR | Freq: Every day | SUBCUTANEOUS | 11 refills | Status: DC

## 2019-08-19 NOTE — Patient Instructions (Addendum)
Dove unscented or [employer] sensitive skin, vagisil unscented     Fungal Nail Infection A fungal nail infection is a common infection of the toenails or fingernails. This condition affects toenails more often than fingernails. It often affects the great, or big, toes. More than one nail may be infected. The condition can be passed from person to person (is contagious). What are the causes? This condition is caused by a fungus. Several types of fungi can cause the infection. These fungi are common in moist and warm areas. If your hands or feet come into contact with the fungus, it may get into a crack in your fingernail or toenail and cause the infection. What increases the risk? The following factors may make you more likely to develop this condition:  Being female.  Being of older age.  Living with someone who has the fungus.  Walking barefoot in areas where the fungus thrives, such as showers or locker rooms.  Wearing shoes and socks that cause your feet to sweat.  Having a nail injury or a recent nail surgery.  Having certain medical conditions, such as: ? Athlete's foot. ? Diabetes. ? Psoriasis. ? Poor circulation. ? A weak body defense system (immune system). What are the signs or symptoms? Symptoms of this condition include:  A pale spot on the nail.  Thickening of the nail.  A nail that becomes yellow or brown.  A brittle or ragged nail edge.  A crumbling nail.  A nail that has lifted away from the nail bed. How is this diagnosed? This condition is diagnosed with a physical exam. Your health care provider may take a scraping or clipping from your nail to test for the fungus. How is this treated? Treatment is not needed for mild infections. If you have significant nail changes, treatment may include:  Antifungal medicines taken by mouth (orally). You may need to take the medicine for several weeks or several months, and you may not see the results for a long  time. These medicines can cause side effects. Ask your health care provider what problems to watch for.  Antifungal nail polish or nail cream. These may be used along with oral antifungal medicines.  Laser treatment of the nail.  Surgery to remove the nail. This may be needed for the most severe infections. It can take a long time, usually up to a year, for the infection to go away. The infection may also come back. Follow these instructions at home: Medicines  Take or apply over-the-counter and prescription medicines only as told by your health care provider.  Ask your health care provider about using over-the-counter mentholated ointment on your nails. Nail care  Trim your nails often.  Wash and dry your hands and feet every day.  Keep your feet dry: ? Wear absorbent socks, and change your socks frequently. ? Wear shoes that allow air to circulate, such as sandals or canvas tennis shoes. Throw out old shoes.  Do not use artificial nails.  If you go to a nail salon, make sure you choose one that uses clean instruments.  Use antifungal foot powder on your feet and in your shoes. General instructions  Do not share personal items, such as towels or nail clippers.  Do not walk barefoot in shower rooms or locker rooms.  Wear rubber gloves if you are working with your hands in wet areas.  Keep all follow-up visits as told by your health care provider. This is important. Contact a health care  provider if: Your infection is not getting better or it is getting worse after several months. Summary  A fungal nail infection is a common infection of the toenails or fingernails.  Treatment is not needed for mild infections. If you have significant nail changes, treatment may include taking medicine orally and applying medicine to your nails.  It can take a long time, usually up to a year, for the infection to go away. The infection may also come back.  Take or apply over-the-counter  and prescription medicines only as told by your health care provider.  Follow instructions for taking care of your nails to help prevent infection from coming back or spreading. This information is not intended to replace advice given to you by your health care provider. Make sure you discuss any questions you have with your health care provider. Document Released: 09/19/2000 Document Revised: 01/13/2019 Document Reviewed: 02/26/2018 Elsevier Patient Education  [place].   Terbinafine oral granules What is this medicine? TERBINAFINE (TER bin a feen) is an antifungal medicine. It is used to treat certain kinds of fungal or yeast infections. This medicine may be used for other purposes; ask your health care provider or pharmacist if you have questions. COMMON BRAND NAME(S): Lamisil What should I tell my health care provider before I take this medicine? They need to know if you have any of these conditions:  drink alcoholic beverages  kidney disease  liver disease  an unusual or allergic reaction to Terbinafine, other medicines, foods, dyes, or preservatives  pregnant or trying to get pregnant  breast-feeding How should I use this medicine? Take this medicine by mouth. Follow the directions on the prescription label. Hold packet with cut line on top. Shake packet gently to settle contents. Tear packet open along cut line, or use scissors to cut across line. Carefully pour the entire contents of packet onto a spoonful of a soft food, such as pudding or other soft, non-acidic food such as mashed potatoes (do NOT use applesauce or a fruit-based food). If two packets are required for each dose, you may either sprinkle the content of both packets on one spoonful of non-acidic food, or sprinkle the contents of both packets on two spoonfuls of non-acidic food. Make sure that no granules remain in the packet. Swallow the mxiture of the food and granules without chewing. Take your medicine  at regular intervals. Do not take it more often than directed. Take all of your medicine as directed even if you think you are better. Do not skip doses or stop your medicine early. Contact your pediatrician or health care professional regarding the use of this medicine in children. While this medicine may be prescribed for children as young as 4 years for selected conditions, precautions do apply. Overdosage: If you think you have taken too much of this medicine contact a poison control center or emergency room at once. NOTE: This medicine is only for you. Do not share this medicine with others. What if I miss a dose? If you miss a dose, take it as soon as you can. If it is almost time for your next dose, take only that dose. Do not take double or extra doses. What may interact with this medicine? Do not take this medicine with any of the following medications:  thioridazine This medicine may also interact with the following medications:  beta-blockers  caffeine  cimetidine  cyclosporine  MAOIs like Carbex, Eldepryl, Marplan, Nardil, and Parnate  medicines for fungal infections  like fluconazole and ketoconazole  medicines for irregular heartbeat like amiodarone, flecainide and propafenone  rifampin  SSRIs like citalopram, escitalopram, fluoxetine, fluvoxamine, paroxetine and sertraline  tricyclic antidepressants like amitriptyline, clomipramine, desipramine, imipramine, nortriptyline, and others  warfarin This list may not describe all possible interactions. Give your health care provider a list of all the medicines, herbs, non-prescription drugs, or dietary supplements you use. Also tell them if you smoke, drink alcohol, or use illegal drugs. Some items may interact with your medicine. What should I watch for while using this medicine? Your doctor may monitor your liver function. Tell your doctor right away if you have nausea or vomiting, loss of appetite, stomach pain on your  right upper side, yellow skin, dark urine, light stools, or are over tired. This medicine may cause serious skin reactions. They can happen weeks to months after starting the medicine. Contact your health care provider right away if you notice fevers or flu-like symptoms with a rash. The rash may be red or purple and then turn into blisters or peeling of the skin. Or, you might notice a red rash with swelling of the face, lips or lymph nodes in your neck or under your arms. You need to take this medicine for 6 weeks or longer to cure the fungal infection. Take your medicine regularly for as long as your doctor or health care provider tells you to. What side effects may I notice from receiving this medicine? Side effects that you should report to your doctor or health care professional as soon as possible:  allergic reactions like skin rash or hives, swelling of the face, lips, or tongue  change in vision  dark urine  fever or infection  general ill feeling or flu-like symptoms  light-colored stools  loss of appetite, nausea  rash, fever, and swollen lymph nodes  redness, blistering, peeling or loosening of the skin, including inside the mouth  right upper belly pain  unusually weak or tired  yellowing of the eyes or skin Side effects that usually do not require medical attention (report to your doctor or health care professional if they continue or are bothersome):  changes in taste  diarrhea  hair loss  muscle or joint pain  stomach upset This list may not describe all possible side effects. Call your doctor for medical advice about side effects. You may report side effects to FDA at 1-800-FDA-1088. Where should I keep my medicine? Keep out of the reach of children. Store at room temperature between 15 and 30 degrees C (59 and 86 degrees F). Throw away any unused medicine after the expiration date. NOTE: This sheet is a summary. It may not cover all possible information.  If you have questions about this medicine, talk to your doctor, pharmacist, or health care provider.  2020 Elsevier/Gold Standard (2018-12-31 15:35:11)  Bacterial Vaginosis  Bacterial vaginosis is an infection of the vagina. It happens when too many normal germs (healthy bacteria) grow in the vagina. This infection puts you at risk for infections from sex (STIs). Treating this infection can lower your risk for some STIs. You should also treat this if you are pregnant. It can cause your baby to be born early. Follow these instructions at home: Medicines  Take over-the-counter and prescription medicines only as told by your doctor.  Take or use your antibiotic medicine as told by your doctor. Do not stop taking or using it even if you start to feel better. General instructions  If you your sexual  partner is a woman, tell her that you have this infection. She needs to get treatment if she has symptoms. If you have a female partner, he does not need to be treated.  During treatment: ? Avoid sex. ? Do not douche. ? Avoid alcohol as told. ? Avoid breastfeeding as told.  Drink enough fluid to keep your pee (urine) clear or pale yellow.  Keep your vagina and butt (rectum) clean. ? Wash the area with warm water every day. ? Wipe from front to back after you use the toilet.  Keep all follow-up visits as told by your doctor. This is important. Preventing this condition  Do not douche.  Use only warm water to wash around your vagina.  Use protection when you have sex. This includes: ? Latex condoms. ? Dental dams.  Limit how many people you have sex with. It is best to only have sex with the same person (be monogamous).  Get tested for STIs. Have your partner get tested.  Wear underwear that is cotton or lined with cotton.  Avoid tight pants and pantyhose. This is most important in summer.  Do not use any products that have nicotine or tobacco in them. These include cigarettes and  e-cigarettes. If you need help quitting, ask your doctor.  Do not use illegal drugs.  Limit how much alcohol you drink. Contact a doctor if:  Your symptoms do not get better, even after you are treated.  You have more discharge or pain when you pee (urinate).  You have a fever.  You have pain in your belly (abdomen).  You have pain with sex.  Your bleed from your vagina between periods. Summary  This infection happens when too many germs (bacteria) grow in the vagina.  Treating this condition can lower your risk for some infections from sex (STIs).  You should also treat this if you are pregnant. It can cause early (premature) birth.  Do not stop taking or using your antibiotic medicine even if you start to feel better. This information is not intended to replace advice given to you by your health care provider. Make sure you discuss any questions you have with your health care provider. Document Released: 07/01/2008 Document Revised: 09/04/2017 Document Reviewed: 06/07/2016 Elsevier Patient Education  2020 [employer].

## 2019-08-19 NOTE — Progress Notes (Signed)
Chief Complaint  Patient presents with  . Follow-up   F/u  1. HTN controlled on norvasc 5 mg qd and spiron 12.5 mg qd doing well  2. Toenail fungus wants tx for these and due to f/u with podiatry in 3 months  3. Still having leakage of urine after pelvic PT    Review of Systems  Constitutional: Negative for weight loss.  HENT: Negative for hearing loss.   Eyes: Negative for blurred vision.  Respiratory: Negative for shortness of breath.   Cardiovascular: Negative for chest pain.  Gastrointestinal: Negative for abdominal pain.  Genitourinary:       Urinary incontinence    Musculoskeletal: Negative for falls.  Skin: Negative for rash.       +toenail fungus   Neurological: Negative for headaches.  Psychiatric/Behavioral: Negative for depression.   Past Medical History:  Diagnosis Date  . Allergic rhinitis   . Bipolar disorder ([place])   . Chronic low back pain   . CTS (carpal tunnel syndrome)    hands  . Depression   . Gallstones   . GERD (gastroesophageal reflux disease)   . History of prediabetes   . Hypertension   . Laceration of right foot   . Onychomycosis   . OSA on CPAP   . Overactive bladder   . PVC's (premature ventricular contractions)   . Seizures ([place])    8th grade   Past Surgical History:  Procedure Laterality Date  . ABDOMINAL HYSTERECTOMY  07/08/2011  . ABDOMINAL HYSTERECTOMY     supracervical in 07/2011   . CARPAL TUNNEL RELEASE     right hand 1996  . CESAREAN SECTION     2012  . CHOLECYSTECTOMY N/A 01/12/2018   Procedure: LAPAROSCOPIC CHOLECYSTECTOMY WITH INTRAOPERATIVE CHOLANGIOGRAM ERAS PATHWAY;  Surgeon: Johnathan Hausen, MD;  Location: [facility] ORS;  Service: General;  Laterality: N/A;  . COLONOSCOPY WITH PROPOFOL N/A 04/15/2019   Procedure: COLONOSCOPY WITH PROPOFOL;  Surgeon: Jonathon Bellows, MD;  Location: [facility] ENDOSCOPY;  Service: Gastroenterology;  Laterality: N/A;  . DILATION AND CURETTAGE OF UTERUS     04/2011 benign endometrial polyp squamous  metaplasia, inflamm, blood, mucous, benign   . FOOT SURGERY     Dr. Prudence Davidson   . LEEP     2006  . TUBAL LIGATION     Family History  Problem Relation Age of Onset  . Cancer Father        esophageal   . Hypertension Father   . Heart failure Mother   . Hypertension Mother   . Early death Mother   . Heart disease Mother        chf  . Breast cancer Sister 39  . Cancer Sister        breast  . Depression Sister   . Mental illness Sister   . Asthma Son   . Cancer Other        brain cancer   Social History   Socioeconomic History  . Marital status: Married    Spouse name: Not on file  . Number of children: Not on file  . Years of education: Not on file  . Highest education level: Not on file  Occupational History  . Not on file  Social Needs  . Financial resource strain: Not hard at all  . Food insecurity    Worry: Never true    Inability: Never true  . Transportation needs    Medical: No    Non-medical: No  Tobacco Use  . Smoking  status: Never Smoker  . Smokeless tobacco: Current User    Types: Snuff  . Tobacco comment: Is in process of decreasing amount  Substance and Sexual Activity  . Alcohol use: No    Alcohol/week: 0.0 standard drinks  . Drug use: No  . Sexual activity: Yes    Comment: men  Lifestyle  . Physical activity    Days per week: 3 days    Minutes per session: 30 min  . Stress: Not at all  Relationships  . Social Herbalist on phone: Not on file    Gets together: Not on file    Attends religious service: Not on file    Active member of club or organization: Not on file    Attends meetings of clubs or organizations: Not on file    Relationship status: Not on file  . Intimate partner violence    Fear of current or ex partner: No    Emotionally abused: No    Physically abused: No    Forced sexual activity: No  Other Topics Concern  . Not on file  Social History Narrative   Married    No guns    Wears seat belt    Safe in  relationship    Never smoker    2 sons Gaspar Bidding ~8 and another son ~26 as of 08/2018     2 sisters    Unemployed       Current Meds  Medication Sig  . amLODipine (NORVASC) 5 MG tablet Take 1 tablet (5 mg total) by mouth daily.  . benztropine (COGENTIN) 1 MG tablet Take 1 mg by mouth daily as needed (anxiety).   . camphor-menthol (SARNA) lotion Apply 1 application topically as needed for itching.  . cetirizine (ZYRTEC) 10 MG tablet Take 1 tablet (10 mg total) by mouth daily.  Marland Kitchen EPINEPHrine 0.3 mg/0.3 mL IJ SOAJ injection Inject 0.3 mLs (0.3 mg total) into the muscle as needed for anaphylaxis.  Marland Kitchen FLUoxetine (PROZAC) 20 MG capsule Take 20 mg by mouth daily.  . fluPHENAZine (PROLIXIN) 2.5 MG tablet Take 2.5 mg by mouth at bedtime.   . fluticasone (FLONASE) 50 MCG/ACT nasal spray Place 2 sprays into both nostrils daily.  Marland Kitchen gabapentin (NEURONTIN) 300 MG capsule Take 1 capsule (300 mg total) by mouth 3 (three) times daily.  . hydrocortisone 2.5 % cream Apply topically 2 (two) times daily. Face for rash  . hydrOXYzine (ATARAX/VISTARIL) 25 MG tablet Take 1 tablet (25 mg total) by mouth every 8 (eight) hours as needed for itching. Or itchy rash do not take with zyrtec at the same time  . meloxicam (MOBIC) 15 MG tablet meloxicam 15 mg tablet  Take 1 tablet(s) every day by oral route with meals.  . metFORMIN (GLUCOPHAGE) 500 MG tablet Take 500 mg by mouth daily with breakfast.  . metoprolol succinate (TOPROL-XL) 25 MG 24 hr tablet Take 1 tablet (25 mg total) by mouth daily. Take with or immediately following a meal.  . metroNIDAZOLE (METROGEL) 0.75 % vaginal gel Place 1 Applicatorful vaginally 2 (two) times daily. X 7-10 days  . multivitamin-lutein (OCUVITE-LUTEIN) CAPS capsule Take 1 capsule by mouth daily.  Marland Kitchen oxyCODONE-acetaminophen (PERCOCET) 10-325 MG tablet Take 1 tablet by mouth every 6 (six) hours as needed for pain. (Patient taking differently: Take 1 tablet by mouth every 8 (eight) hours as  needed for pain (Dr. Mirna Mires pain). )  . pantoprazole (PROTONIX) 40 MG tablet Take 1 tablet (40 mg total)  by mouth daily.  . phentermine (ADIPEX-P) 37.5 MG tablet Take 0.5-1 tablets (18.75-37.5 mg total) by mouth daily before breakfast.  . polyethylene glycol (MIRALAX / GLYCOLAX) packet Take 17 g by mouth daily as needed for mild constipation.  . pravastatin (PRAVACHOL) 10 MG tablet Take 1 tablet (10 mg total) by mouth daily.  . silver sulfADIAZINE (SSD) 1 % cream Apply 1 application topically daily.  Marland Kitchen spironolactone (ALDACTONE) 25 MG tablet Take 0.5 tablets (12.5 mg total) by mouth daily.  Marland Kitchen triamcinolone cream (KENALOG) 0.1 % Apply 1 application topically 2 (two) times daily. As needed to bumps on skin not on face  . [DISCONTINUED] sulfamethoxazole-trimethoprim (BACTRIM DS) 800-160 MG tablet Take 1 tablet by mouth 2 (two) times daily.   Allergies  Allergen Reactions  . Cephalexin Swelling and Rash  . Hctz [Hydrochlorothiazide] Rash and Swelling    Mild she says  . Lisinopril Hives and Other (See Comments)    Headache, Low BP, angioedema    . Losartan Hives and Swelling    Angioedema  . Penicillins Rash    Has patient had a PCN reaction causing immediate rash, facial/tongue/throat swelling, SOB or lightheadedness with hypotension: Yes Has patient had a PCN reaction causing severe rash involving mucus membranes or skin necrosis: No Has patient had a PCN reaction that required hospitalization: No Has patient had a PCN reaction occurring within the last 10 years: No If all of the above answers are "NO", then may proceed with Cephalosporin use.   . Bee Venom     Swelling   . Metformin And Related     Rash    No results found for this or any previous visit (from the past 2160 hour(s)). Objective  Body mass index is 43.58 kg/m. Wt Readings from Last 3 Encounters:  08/19/19 246 lb (111.6 kg)  07/08/19 245 lb 9.6 oz (111.4 kg)  06/09/19 245 lb 9.6 oz (111.4 kg)   Temp Readings  from Last 3 Encounters:  08/19/19 98 F (36.7 C) (Skin)  05/05/19 (!) 97.1 F (36.2 C)  04/15/19 (!) 97.2 F (36.2 C) (Tympanic)   BP Readings from Last 3 Encounters:  08/19/19 122/68  06/09/19 102/76  04/15/19 124/69   Pulse Readings from Last 3 Encounters:  08/19/19 89  06/09/19 84  04/15/19 62    Physical Exam Vitals signs and nursing note reviewed.  Constitutional:      Appearance: Normal appearance. She is well-developed and well-groomed. She is morbidly obese.  HENT:     Head: Normocephalic and atraumatic.  Eyes:     Conjunctiva/sclera: Conjunctivae normal.     Pupils: Pupils are equal, round, and reactive to light.  Cardiovascular:     Rate and Rhythm: Normal rate and regular rhythm.     Heart sounds: Normal heart sounds. No murmur.  Pulmonary:     Effort: Pulmonary effort is normal.     Breath sounds: Normal breath sounds.  Skin:    General: Skin is warm and dry.     Comments: Onychomycosis    Neurological:     General: No focal deficit present.     Mental Status: She is alert and oriented to person, place, and time. Mental status is at baseline.     Gait: Gait normal.  Psychiatric:        Attention and Perception: Attention and perception normal.        Mood and Affect: Mood and affect normal.        Speech: Speech normal.  Behavior: Behavior normal. Behavior is cooperative.        Thought Content: Thought content normal.        Cognition and Memory: Cognition and memory normal.        Judgment: Judgment normal.     Assessment  Plan  Acute vaginitis - Plan: fluconazole (DIFLUCAN) 150 MG tablet  Onychomycosis - Plan: terbinafine (LAMISIL) 250 MG tablet, Ciclopirox 8 % KIT  Morbid obesity ([place]) - Plan: Liraglutide -Weight Management (SAXENDA) 18 MG/3ML SOPN  Essential hypertension  Controlled cont meds    HM Declines flu shotallergic Tdap utd due 04/18/2019  Allergic to pna vaccine Consider shingrix but given allergies use  caution Not immuneMMR, hep B titer in futureuse caution though with h/o allergies with all vaccines  h/o supracervical hysterectomy ([facility]), pap 02/29/16 negative no comment HPV, another pap 06/23/17 neg pap neg HPV  -h/o LEEP 2006  mammo due referredprev and given # today to call to schedulepts states when COVID 19 over 08/04/19 negative   Colonoscopy7/10/20 normal f/u in 10 years  Echo Dr.Kowalski  -nl LV function EF 60% moderate mitral insuff and mild tricuspid insufficiency treadmill stress test neg 07/30/13    HIV neg 02/04/16  Ob/gyn appt pending bladder leakage [facility] (stopped PT w/o help worsen sneezing or coughing)    Provider: Dr. Olivia Mackie McLean-Scocuzza-Internal Medicine

## 2019-08-19 NOTE — Progress Notes (Signed)
Pre visit review using our clinic review tool, if applicable. No additional management support is needed unless otherwise documented below in the visit note. 

## 2019-08-30 ENCOUNTER — Other Ambulatory Visit: Payer: Self-pay | Admitting: Internal Medicine

## 2019-08-30 DIAGNOSIS — I10 Essential (primary) hypertension: Secondary | ICD-10-CM

## 2019-08-30 DIAGNOSIS — E785 Hyperlipidemia, unspecified: Secondary | ICD-10-CM

## 2019-08-30 DIAGNOSIS — B351 Tinea unguium: Secondary | ICD-10-CM

## 2019-08-30 DIAGNOSIS — K219 Gastro-esophageal reflux disease without esophagitis: Secondary | ICD-10-CM

## 2019-08-30 DIAGNOSIS — L509 Urticaria, unspecified: Secondary | ICD-10-CM

## 2019-08-30 DIAGNOSIS — G8929 Other chronic pain: Secondary | ICD-10-CM

## 2019-08-30 DIAGNOSIS — J309 Allergic rhinitis, unspecified: Secondary | ICD-10-CM

## 2019-08-30 MED ORDER — CICLOPIROX 8 % EX KIT: 1.0000 "application " | PACK | Freq: Every day | CUTANEOUS | 12 refills | Status: DC

## 2019-08-30 MED ORDER — METOPROLOL SUCCINATE ER 25 MG PO TB24: 25.0000 mg | ORAL_TABLET | Freq: Every day | ORAL | 3 refills | Status: DC

## 2019-08-30 MED ORDER — FLUTICASONE PROPIONATE 50 MCG/ACT NA SUSP: 2.0000 | Freq: Every day | NASAL | 3 refills | Status: DC

## 2019-08-30 MED ORDER — SPIRONOLACTONE 25 MG PO TABS: 12.5000 mg | ORAL_TABLET | Freq: Every day | ORAL | 3 refills | Status: DC

## 2019-08-30 MED ORDER — PANTOPRAZOLE SODIUM 40 MG PO TBEC: 40.0000 mg | DELAYED_RELEASE_TABLET | Freq: Every day | ORAL | 3 refills | Status: DC

## 2019-08-30 MED ORDER — GABAPENTIN 300 MG PO CAPS: 300.0000 mg | ORAL_CAPSULE | Freq: Three times a day (TID) | ORAL | 3 refills | Status: DC

## 2019-08-30 MED ORDER — AMLODIPINE BESYLATE 5 MG PO TABS: 5.0000 mg | ORAL_TABLET | Freq: Every day | ORAL | 3 refills | Status: DC

## 2019-08-30 MED ORDER — HYDROXYZINE HCL 25 MG PO TABS: 25.0000 mg | ORAL_TABLET | Freq: Three times a day (TID) | ORAL | 3 refills | Status: DC | PRN

## 2019-08-30 MED ORDER — TERBINAFINE HCL 250 MG PO TABS: 250.0000 mg | ORAL_TABLET | Freq: Every day | ORAL | 0 refills | Status: DC

## 2019-08-30 MED ORDER — PRAVASTATIN SODIUM 10 MG PO TABS: 10.0000 mg | ORAL_TABLET | Freq: Every day | ORAL | 3 refills | Status: DC

## 2019-08-30 MED ORDER — SAXENDA 18 MG/3ML ~~LOC~~ SOPN: 0.6000 mg | PEN_INJECTOR | Freq: Every day | SUBCUTANEOUS | 11 refills | Status: DC

## 2019-09-06 DIAGNOSIS — F2089 Other schizophrenia: Secondary | ICD-10-CM | POA: Diagnosis not present

## 2019-09-06 DIAGNOSIS — K219 Gastro-esophageal reflux disease without esophagitis: Secondary | ICD-10-CM | POA: Diagnosis not present

## 2019-09-06 DIAGNOSIS — Z6841 Body Mass Index (BMI) 40.0 and over, adult: Secondary | ICD-10-CM | POA: Diagnosis not present

## 2019-09-06 DIAGNOSIS — E785 Hyperlipidemia, unspecified: Secondary | ICD-10-CM | POA: Diagnosis not present

## 2019-09-06 DIAGNOSIS — I10 Essential (primary) hypertension: Secondary | ICD-10-CM | POA: Diagnosis not present

## 2019-09-23 DIAGNOSIS — H524 Presbyopia: Secondary | ICD-10-CM | POA: Diagnosis not present

## 2019-10-12 DIAGNOSIS — M545 Low back pain: Secondary | ICD-10-CM | POA: Diagnosis not present

## 2019-10-12 DIAGNOSIS — Z79891 Long term (current) use of opiate analgesic: Secondary | ICD-10-CM | POA: Diagnosis not present

## 2019-10-12 DIAGNOSIS — G894 Chronic pain syndrome: Secondary | ICD-10-CM | POA: Diagnosis not present

## 2019-10-20 ENCOUNTER — Ambulatory Visit (INDEPENDENT_AMBULATORY_CARE_PROVIDER_SITE_OTHER): Payer: Medicare HMO | Admitting: Internal Medicine

## 2019-10-20 ENCOUNTER — Encounter: Payer: Self-pay | Admitting: Podiatry

## 2019-10-20 ENCOUNTER — Encounter: Payer: Self-pay | Admitting: Internal Medicine

## 2019-10-20 ENCOUNTER — Other Ambulatory Visit: Payer: Self-pay

## 2019-10-20 ENCOUNTER — Ambulatory Visit (INDEPENDENT_AMBULATORY_CARE_PROVIDER_SITE_OTHER): Payer: Medicare HMO | Admitting: Podiatry

## 2019-10-20 VITALS — Ht 63.0 in | Wt 265.0 lb

## 2019-10-20 DIAGNOSIS — B351 Tinea unguium: Secondary | ICD-10-CM | POA: Diagnosis not present

## 2019-10-20 DIAGNOSIS — R32 Unspecified urinary incontinence: Secondary | ICD-10-CM

## 2019-10-20 DIAGNOSIS — M79676 Pain in unspecified toe(s): Secondary | ICD-10-CM

## 2019-10-20 DIAGNOSIS — B9689 Other specified bacterial agents as the cause of diseases classified elsewhere: Secondary | ICD-10-CM

## 2019-10-20 DIAGNOSIS — D649 Anemia, unspecified: Secondary | ICD-10-CM

## 2019-10-20 DIAGNOSIS — I10 Essential (primary) hypertension: Secondary | ICD-10-CM | POA: Diagnosis not present

## 2019-10-20 DIAGNOSIS — N393 Stress incontinence (female) (male): Secondary | ICD-10-CM

## 2019-10-20 DIAGNOSIS — N760 Acute vaginitis: Secondary | ICD-10-CM | POA: Diagnosis not present

## 2019-10-20 DIAGNOSIS — Z6841 Body Mass Index (BMI) 40.0 and over, adult: Secondary | ICD-10-CM

## 2019-10-20 HISTORY — DX: Other specified bacterial agents as the cause of diseases classified elsewhere: B96.89

## 2019-10-20 HISTORY — DX: Other specified bacterial agents as the cause of diseases classified elsewhere: N76.0

## 2019-10-20 MED ORDER — ACIDOPHILUS LACTOBACILLUS PO CAPS: 1.0000 | ORAL_CAPSULE | Freq: Every day | ORAL | 3 refills | Status: DC

## 2019-10-20 MED ORDER — PHENTERMINE HCL 37.5 MG PO TABS: 37.5000 mg | ORAL_TABLET | Freq: Every day | ORAL | 0 refills | Status: DC

## 2019-10-20 MED ORDER — METRONIDAZOLE 0.75 % VA GEL: 1.0000 | Freq: Two times a day (BID) | VAGINAL | 0 refills | Status: DC

## 2019-10-20 NOTE — Progress Notes (Addendum)
Complaint:  Visit Type: Patient returns to my office for continued preventative foot care services. Complaint: Patient states" my nails have grown long and thick and become painful to walk and wear shoes"  The patient presents for preventative foot care services. No changes to ROS  Podiatric Exam: Vascular: dorsalis pedis and posterior tibial pulses are palpable bilateral. Capillary return is immediate. Temperature gradient is WNL. Skin turgor WNL  Sensorium: Normal Semmes Weinstein monofilament test. Normal tactile sensation bilaterally. Nail Exam: Pt has thick disfigured discolored nails with subungual debris noted bilateral entire nail hallux through fifth toenails.  Nail spicule medial border right hallux following previous nail surgery.  Spicule painful 6 days. Ulcer Exam: There is no evidence of ulcer or pre-ulcerative changes or infection. Orthopedic Exam: Muscle tone and strength are WNL. No limitations in general ROM. No crepitus or effusions noted. Foot type and digits show no abnormalities. Bony prominences are unremarkable. Skin: No Porokeratosis. No infection or ulcers  Diagnosis:  Onychomycosis, , Pain in right toe, pain in left toes  Treatment & Plan Procedures and Treatment: Consent by patient was obtained for treatment procedures.   Debridement of mycotic and hypertrophic toenails, 1 through 5 bilateral and clearing of subungual debris. No ulceration, no infection noted. Debride spicule.  Discuss possible surgery in future if needed. Return Visit-Office Procedure: Patient instructed to return to the office for a follow up visit 3 months for continued evaluation and treatment.    Gardiner Barefoot DPM

## 2019-10-20 NOTE — Patient Instructions (Signed)

## 2019-10-20 NOTE — Progress Notes (Signed)
Virtual Visit via Video Note  I connected with Heather Sweeney  on 10/20/19 at 10:30 AM EST by a video enabled telemedicine application and verified that I am speaking with the correct person using two identifiers.  Location patient: home Location provider:work or home office Persons participating in the virtual visit: patient, provider, granson Heather Sweeney 54 y.o   I discussed the limitations of evaluation and management by telemedicine and the availability of in person appointments. The patient expressed understanding and agreed to proceed.   HPI: 1. Recurrent BV completed flagyl in the past but still smelling fishy odor  2. SUI/Urinary leakage she discussed with ob/gyn surgery and tried pelvic PT which helped but still having sx's She wants [facility] back to [facility] 3. Morbid obesity wants refill of adipex and felt like gained 15-20 lbs recently.   4. HTN on toprol 25 mg qd, spironolactone 12.5 mg qd, norvasc 5 mg qd no reading for today   ROS: See pertinent positives and negatives per HPI.  Past Medical History:  Diagnosis Date  . Allergic rhinitis   . Bipolar disorder ([place])   . Chronic low back pain   . CTS (carpal tunnel syndrome)    hands  . Depression   . Gallstones   . GERD (gastroesophageal reflux disease)   . History of prediabetes   . Hypertension   . Laceration of right foot   . Onychomycosis   . OSA on CPAP   . Overactive bladder   . PVC's (premature ventricular contractions)   . Seizures ([place])    8th grade    Past Surgical History:  Procedure Laterality Date  . ABDOMINAL HYSTERECTOMY  07/08/2011  . ABDOMINAL HYSTERECTOMY     supracervical in 07/2011   . CARPAL TUNNEL RELEASE     right hand 1996  . CESAREAN SECTION     2012  . CHOLECYSTECTOMY N/A 01/12/2018   Procedure: LAPAROSCOPIC CHOLECYSTECTOMY WITH INTRAOPERATIVE CHOLANGIOGRAM ERAS PATHWAY;  Surgeon: Johnathan Hausen, MD;  Location: [facility];  Service: General;  Laterality: N/A;  . COLONOSCOPY WITH  PROPOFOL N/A 04/15/2019   Procedure: COLONOSCOPY WITH PROPOFOL;  Surgeon: Jonathon Bellows, MD;  Location: [facility] ENDOSCOPY;  Service: Gastroenterology;  Laterality: N/A;  . DILATION AND CURETTAGE OF UTERUS     04/2011 benign endometrial polyp squamous metaplasia, inflamm, blood, mucous, benign   . FOOT SURGERY     Dr. Prudence Davidson   . LEEP     2006  . TUBAL LIGATION      Family History  Problem Relation Age of Onset  . Cancer Father        esophageal   . Hypertension Father   . Heart failure Mother   . Hypertension Mother   . Early death Mother   . Heart disease Mother        chf  . Breast cancer Sister 54  . Cancer Sister        breast  . Depression Sister   . Mental illness Sister   . Asthma Son   . Cancer Other        brain cancer    SOCIAL HX:   Married  No guns  Wears seat belt  Safe in relationship  Never smoker  2 sons Heather Sweeney ~10 and another son ~28 as of 10/2019 1 granson Heather Sweeney 2 sisters  Unemployed   Current Outpatient Medications:  .  amLODipine (NORVASC) 5 MG tablet, Take 1 tablet (5 mg total) by mouth daily., Disp: 90 tablet, Rfl: 3 .  benztropine (COGENTIN) 1 MG tablet, Take 1 mg by mouth daily as needed (anxiety). , Disp: , Rfl:  .  Ciclopirox 8 % KIT, Apply 1 application topically daily. Next to skin on toenails daily and remove every 7 days with alcohol, Disp: 1 kit, Rfl: 12 .  EPINEPHrine 0.3 mg/0.3 mL IJ SOAJ injection, Inject 0.3 mLs (0.3 mg total) into the muscle as needed for anaphylaxis., Disp: 1 Device, Rfl: 1 .  FLUoxetine (PROZAC) 20 MG capsule, Take 20 mg by mouth daily., Disp: , Rfl:  .  fluPHENAZine (PROLIXIN) 2.5 MG tablet, Take 2.5 mg by mouth at bedtime. , Disp: , Rfl:  .  fluticasone (FLONASE) 50 MCG/ACT nasal spray, Place 2 sprays into both nostrils daily., Disp: 48 g, Rfl: 3 .  gabapentin (NEURONTIN) 300 MG capsule, Take 1 capsule (300 mg total) by mouth 3 (three) times daily., Disp: 270 capsule, Rfl: 3 .  hydrOXYzine (ATARAX/VISTARIL) 25 MG tablet,  Take 1 tablet (25 mg total) by mouth every 8 (eight) hours as needed for itching. Or itchy rash do not take with zyrtec at the same time, Disp: 270 tablet, Rfl: 3 .  metFORMIN (GLUCOPHAGE-XR) 500 MG 24 hr tablet, , Disp: , Rfl:  .  metoprolol succinate (TOPROL-XL) 25 MG 24 hr tablet, Take 1 tablet (25 mg total) by mouth daily. Take with or immediately following a meal., Disp: 90 tablet, Rfl: 3 .  oxyCODONE-acetaminophen (PERCOCET) 10-325 MG tablet, Take 1 tablet by mouth every 6 (six) hours as needed for pain. (Patient taking differently: Take 1 tablet by mouth every 8 (eight) hours as needed for pain (Dr. Mirna Mires pain). ), Disp: 30 tablet, Rfl: 0 .  pantoprazole (PROTONIX) 40 MG tablet, Take 1 tablet (40 mg total) by mouth daily., Disp: 90 tablet, Rfl: 3 .  polyethylene glycol (MIRALAX / GLYCOLAX) packet, Take 17 g by mouth daily as needed for mild constipation., Disp: , Rfl:  .  spironolactone (ALDACTONE) 25 MG tablet, Take 0.5 tablets (12.5 mg total) by mouth daily., Disp: 45 tablet, Rfl: 3 .  terbinafine (LAMISIL) 250 MG tablet, Take 1 tablet (250 mg total) by mouth daily. Dc after 3 months, Disp: 90 tablet, Rfl: 0 .  Acidophilus Lactobacillus CAPS, Take 1 capsule by mouth daily., Disp: 90 capsule, Rfl: 3 .  metroNIDAZOLE (METROGEL VAGINAL) 0.75 % vaginal gel, Place 1 Applicatorful vaginally 2 (two) times daily. X 5 days, Disp: 140 g, Rfl: 0 .  phentermine (ADIPEX-P) 37.5 MG tablet, Take 1 tablet (37.5 mg total) by mouth daily before breakfast., Disp: 60 tablet, Rfl: 0  EXAM:  VITALS per patient if applicable:  GENERAL: alert, oriented, appears well and in no acute distress  HEENT: atraumatic, conjunttiva clear, no obvious abnormalities on inspection of external nose and ears  NECK: normal movements of the head and neck  LUNGS: on inspection no signs of respiratory distress, breathing rate appears normal, no obvious gross SOB, gasping or wheezing  CV: no obvious cyanosis  MS: moves all  visible extremities without noticeable abnormality  PSYCH/NEURO: pleasant and cooperative, no obvious depression or anxiety, speech and thought processing grossly intact  ASSESSMENT AND PLAN:  Discussed the following assessment and plan:  Bacterial vaginosis - Plan: metroNIDAZOLE (METROGEL VAGINAL) 0.75 % vaginal gel, Acidophilus Lactobacillus CAPS Consider topical probiotic will CC ob/gyn to figure out the name of this  Morbid obesity with BMI of 40.0-44.9, adult ([place]) - Plan: phentermine (ADIPEX-P) 37.5 MG tablet adipex x 2 more months then needs 3 month break  Urinary incontinence, unspecified type SUI (stress urinary incontinence, female) 2. SUI/Urinary leakage she discussed with ob/gyn surgery and tried pelvic PT which helped but still having sx's She wants [facility] She also wants surgery   Essential hypertension Cont meds   HM Declines flu shotallergic Tdap utd due 04/18/2019 will do in future at f/u  Allergic to pna vaccine Consider shingrix but given allergies use caution Not immuneMMR, hep B titer in futureuse caution though with h/o allergies with all vaccines  h/o supracervical hysterectomy ([place]), pap 02/29/16 negative no comment HPV, another pap 06/23/17 neg pap neg HPV  -h/o LEEP 2006  mammo 08/04/19 negative   Colonoscopy7/10/20 normal f/u in 10 years  Echo Dr.Kowalski  -nl LV function EF 60% moderate mitral insuff and mild tricuspid insufficiency treadmill stress test neg 07/30/13    HIV neg 02/04/16  Ob/gyn appt pending bladder leakage [facility] (stopped PT w/o help worsen sneezing or coughing)  -Dr. Georgianne Fick pt wants surgery   -we discussed possible serious and likely etiologies, options for evaluation and workup, limitations of telemedicine visit vs in person visit, treatment, treatment risks and precautions. Pt prefers to treat via telemedicine empirically rather then risking or  undertaking an in person visit at this moment. Patient agrees to seek prompt in person care if worsening, new symptoms arise, or if is not improving with treatment.   I discussed the assessment and treatment plan with the patient. The patient was provided an opportunity to ask questions and all were answered. The patient agreed with the plan and demonstrated an understanding of the instructions.   The patient was advised to call back or seek an in-person evaluation if the symptoms worsen or if the condition fails to improve as anticipated.  Time spent 20-29 min Delorise Jackson, MD

## 2019-10-23 MED ORDER — HYLAFEM VA SUPP: 1.0000 | Freq: Every day | VAGINAL | 0 refills | Status: DC | PRN

## 2019-10-23 NOTE — Addendum Note (Signed)
Addended by: Orland Mustard on: 10/23/2019 06:27 PM   Modules accepted: Orders

## 2019-10-25 ENCOUNTER — Telehealth: Payer: Self-pay | Admitting: Internal Medicine

## 2019-10-25 ENCOUNTER — Other Ambulatory Visit: Payer: Self-pay | Admitting: Internal Medicine

## 2019-10-25 DIAGNOSIS — B9689 Other specified bacterial agents as the cause of diseases classified elsewhere: Secondary | ICD-10-CM

## 2019-10-25 MED ORDER — METRONIDAZOLE 500 MG PO TABS: 500.0000 mg | ORAL_TABLET | Freq: Two times a day (BID) | ORAL | 0 refills | Status: DC

## 2019-10-25 NOTE — Telephone Encounter (Signed)
Pt called about wanting pills instead of cream metroNIDAZOLE (METROGEL VAGINAL) 0.75 % vaginal gel due to it being messy. Pt states that the pharmacist said a new Rx can faxed or called in. Medication is Letronidazole 75 mg. Please advise and Thank you!  Pharmacy is [facility] ([facility] [place] - [address]  Call pt @ [phone].

## 2019-10-25 NOTE — Telephone Encounter (Signed)
Pt called back to follow up on medication. Please advise and Thank you!  Call pt @ [phone] .

## 2019-11-03 ENCOUNTER — Ambulatory Visit: Payer: Medicare HMO | Admitting: Podiatry

## 2019-11-07 ENCOUNTER — Telehealth: Payer: Self-pay | Admitting: Internal Medicine

## 2019-11-07 NOTE — Telephone Encounter (Signed)
Patient called and said she has finished her metroNIDAZOLE (FLAGYL) 500 MG tablet, While you were taking the medication it was helping her. She is out of medication and needs a refill.

## 2019-11-08 ENCOUNTER — Telehealth: Payer: Self-pay | Admitting: Internal Medicine

## 2019-11-08 NOTE — Telephone Encounter (Signed)
Pt called wanting to talk to Dr. Olivia Mackie or a nurse. Pt wouldn't give me any info or what the call was about

## 2019-11-08 NOTE — Telephone Encounter (Signed)
This is not medication that you take long term 1 week that she completed should have tx'ed this and does not need refill     [place]

## 2019-11-08 NOTE — Telephone Encounter (Signed)
Would you be okay refilling for patient?

## 2019-11-09 NOTE — Telephone Encounter (Signed)
Called Pt back with message from Dr. Terese Door. Pt stated she understand she couldn't have more Flagyl, but she is still having odor, discharge that is yellowish some white. Pt also stated she urinates on herself without control if she sneeze or laugh. Pt states is there something she can use OTC for the odor and discharge.

## 2019-11-09 NOTE — Telephone Encounter (Signed)
This is ob/gyn related issue  Will see if they can make appt  Recurrent BV and urinary leakage   tMS

## 2019-11-14 ENCOUNTER — Telehealth: Payer: Self-pay

## 2019-11-14 NOTE — Telephone Encounter (Signed)
Patient was informed.

## 2019-11-14 NOTE — Telephone Encounter (Signed)
Patient stated she needs referral to [facility].

## 2019-11-16 ENCOUNTER — Other Ambulatory Visit: Payer: Self-pay | Admitting: Internal Medicine

## 2019-11-16 DIAGNOSIS — R32 Unspecified urinary incontinence: Secondary | ICD-10-CM

## 2019-11-16 DIAGNOSIS — B9689 Other specified bacterial agents as the cause of diseases classified elsewhere: Secondary | ICD-10-CM

## 2019-11-21 ENCOUNTER — Other Ambulatory Visit: Payer: Medicare HMO

## 2019-11-21 ENCOUNTER — Other Ambulatory Visit: Payer: Self-pay

## 2019-11-21 ENCOUNTER — Other Ambulatory Visit (INDEPENDENT_AMBULATORY_CARE_PROVIDER_SITE_OTHER): Payer: Medicare HMO

## 2019-11-21 DIAGNOSIS — D649 Anemia, unspecified: Secondary | ICD-10-CM

## 2019-11-21 DIAGNOSIS — I10 Essential (primary) hypertension: Secondary | ICD-10-CM

## 2019-11-21 LAB — CBC WITH DIFFERENTIAL/PLATELET
Basophils Absolute: 0 10*3/uL (ref 0.0–0.1)
Basophils Relative: 0.3 % (ref 0.0–3.0)
Eosinophils Absolute: 0.3 10*3/uL (ref 0.0–0.7)
Eosinophils Relative: 5.3 % — ABNORMAL HIGH (ref 0.0–5.0)
HCT: 35.5 % — ABNORMAL LOW (ref 36.0–46.0)
Hemoglobin: 12.2 g/dL (ref 12.0–15.0)
Lymphocytes Relative: 38.3 % (ref 12.0–46.0)
Lymphs Abs: 2.1 10*3/uL (ref 0.7–4.0)
MCHC: 34.4 g/dL (ref 30.0–36.0)
MCV: 82 fl (ref 78.0–100.0)
Monocytes Absolute: 0.6 10*3/uL (ref 0.1–1.0)
Monocytes Relative: 10 % (ref 3.0–12.0)
Neutro Abs: 2.6 10*3/uL (ref 1.4–7.7)
Neutrophils Relative %: 46.1 % (ref 43.0–77.0)
Platelets: 207 10*3/uL (ref 150.0–400.0)
RBC: 4.33 Mil/uL (ref 3.87–5.11)
RDW: 13.4 % (ref 11.5–15.5)
WBC: 5.6 10*3/uL (ref 4.0–10.5)

## 2019-11-21 LAB — LIPID PANEL
Cholesterol: 146 mg/dL (ref 0–200)
HDL: 53.3 mg/dL (ref >39.00)
LDL Cholesterol: 80 mg/dL (ref 0–99)
NonHDL: 92.62
Total CHOL/HDL Ratio: 3
Triglycerides: 63 mg/dL (ref 0.0–149.0)
VLDL: 12.6 mg/dL (ref 0.0–40.0)

## 2019-11-21 LAB — COMPREHENSIVE METABOLIC PANEL
ALT: 14 U/L (ref 0–35)
AST: 13 U/L (ref 0–37)
Albumin: 4.2 g/dL (ref 3.5–5.2)
Alkaline Phosphatase: 74 U/L (ref 39–117)
BUN: 11 mg/dL (ref 6–23)
CO2: 28 mEq/L (ref 19–32)
Calcium: 9.1 mg/dL (ref 8.4–10.5)
Chloride: 104 mEq/L (ref 96–112)
Creatinine, Ser: 0.98 mg/dL (ref 0.40–1.20)
GFR: 72.27 mL/min (ref >60.00)
Glucose, Bld: 96 mg/dL (ref 70–99)
Potassium: 4 mEq/L (ref 3.5–5.1)
Sodium: 138 mEq/L (ref 135–145)
Total Bilirubin: 0.3 mg/dL (ref 0.2–1.2)
Total Protein: 7.2 g/dL (ref 6.0–8.3)

## 2019-11-22 ENCOUNTER — Telehealth: Payer: Self-pay

## 2019-11-22 NOTE — Telephone Encounter (Signed)
patient would like rx cetrizine and se would like to start back victoza instead of pills.

## 2019-11-24 ENCOUNTER — Other Ambulatory Visit: Payer: Medicare HMO

## 2019-11-24 ENCOUNTER — Other Ambulatory Visit: Payer: Self-pay | Admitting: Internal Medicine

## 2019-11-24 DIAGNOSIS — J309 Allergic rhinitis, unspecified: Secondary | ICD-10-CM

## 2019-11-24 MED ORDER — CETIRIZINE HCL 10 MG PO TABS: 10.0000 mg | ORAL_TABLET | Freq: Every day | ORAL | 3 refills | Status: DC | PRN

## 2019-11-24 NOTE — Telephone Encounter (Signed)
Again victoza cant be Rx to her for weight loss as she is not diabetic and insurance wont cover saxenda   [place]

## 2019-11-24 NOTE — Telephone Encounter (Signed)
Patient was informed.  Patient understood and no questions, comments, or concerns at this time.  

## 2019-11-25 ENCOUNTER — Ambulatory Visit: Payer: Medicare HMO | Admitting: Internal Medicine

## 2019-11-29 ENCOUNTER — Ambulatory Visit (INDEPENDENT_AMBULATORY_CARE_PROVIDER_SITE_OTHER): Payer: Medicare HMO | Admitting: Obstetrics and Gynecology

## 2019-11-29 ENCOUNTER — Other Ambulatory Visit (HOSPITAL_COMMUNITY)
Admission: RE | Admit: 2019-11-29 | Discharge: 2019-11-29 | Disposition: A | Discharge status: Home / Self Care | Payer: Medicare HMO | Source: Ambulatory Visit | Attending: Obstetrics and Gynecology | Admitting: Obstetrics and Gynecology

## 2019-11-29 ENCOUNTER — Encounter: Payer: Self-pay | Admitting: Obstetrics and Gynecology

## 2019-11-29 ENCOUNTER — Other Ambulatory Visit: Payer: Self-pay

## 2019-11-29 VITALS — BP 124/82 | HR 84 | Ht 63.0 in | Wt 256.0 lb

## 2019-11-29 DIAGNOSIS — Z124 Encounter for screening for malignant neoplasm of cervix: Secondary | ICD-10-CM

## 2019-11-29 DIAGNOSIS — B354 Tinea corporis: Secondary | ICD-10-CM

## 2019-11-29 DIAGNOSIS — Z1151 Encounter for screening for human papillomavirus (HPV): Secondary | ICD-10-CM | POA: Diagnosis not present

## 2019-11-29 DIAGNOSIS — Z113 Encounter for screening for infections with a predominantly sexual mode of transmission: Secondary | ICD-10-CM

## 2019-11-29 DIAGNOSIS — Z90710 Acquired absence of both cervix and uterus: Secondary | ICD-10-CM | POA: Insufficient documentation

## 2019-11-29 DIAGNOSIS — N760 Acute vaginitis: Secondary | ICD-10-CM | POA: Diagnosis not present

## 2019-11-29 MED ORDER — NYSTATIN 100000 UNIT/ML MT SUSP: 5.0000 mL | Freq: Four times a day (QID) | OROMUCOSAL | 0 refills | Status: DC

## 2019-11-29 MED ORDER — METRONIDAZOLE 500 MG PO TABS: 500.0000 mg | ORAL_TABLET | Freq: Two times a day (BID) | ORAL | 0 refills | Status: AC

## 2019-11-29 NOTE — Progress Notes (Signed)
Obstetrics & Gynecology Office Visit   Chief Complaint:  Chief Complaint  Patient presents with  . Vaginal Discharge    Discharge w/odor     History of Present Illness: Ms. Heather Sweeney is a 54 y.o. [id number] who LMP was No LMP recorded. Patient has had a hysterectomy., presents today for a problem visit.   Patient complains of an abnormal vaginal discharge for several weeks. Discharge described as: thin, watery and malodorous. Vaginal symptoms include local irritation.   Menstrual pattern: no bleeding concerns patient is status post prior supracervical hysterectomy.  She denies recent antibiotic exposure, denies changes in soaps, detergents coinciding with the onset of her symptoms.  She has not previously self treated or been under treatment by another provider for these symptoms.  Did test positive for BV in August last year. HgbA1C 5.3 on 05/19/2019  Review of Systems: Review of Systems  Constitutional: Negative.   Genitourinary: Negative.   Skin: Negative.     Past Medical History:  Past Medical History:  Diagnosis Date  . Allergic rhinitis   . Bipolar disorder ([place])   . Chronic low back pain   . CTS (carpal tunnel syndrome)    hands  . Depression   . Gallstones   . GERD (gastroesophageal reflux disease)   . History of prediabetes   . Hypertension   . Laceration of right foot   . Onychomycosis   . OSA on CPAP   . Overactive bladder   . PVC's (premature ventricular contractions)   . Seizures ([place])    8th grade    Past Surgical History:  Past Surgical History:  Procedure Laterality Date  . ABDOMINAL HYSTERECTOMY  07/08/2011  . ABDOMINAL HYSTERECTOMY     supracervical in 07/2011   . CARPAL TUNNEL RELEASE     right hand 1996  . CESAREAN SECTION     2012  . CHOLECYSTECTOMY N/A 01/12/2018   Procedure: LAPAROSCOPIC CHOLECYSTECTOMY WITH INTRAOPERATIVE CHOLANGIOGRAM ERAS PATHWAY;  Surgeon: Johnathan Hausen, MD;  Location: [facility];  Service: General;  Laterality:  N/A;  . COLONOSCOPY WITH PROPOFOL N/A 04/15/2019   Procedure: COLONOSCOPY WITH PROPOFOL;  Surgeon: Jonathon Bellows, MD;  Location: [facility] ENDOSCOPY;  Service: Gastroenterology;  Laterality: N/A;  . DILATION AND CURETTAGE OF UTERUS     04/2011 benign endometrial polyp squamous metaplasia, inflamm, blood, mucous, benign   . FOOT SURGERY     Dr. Prudence Davidson   . LEEP     2006  . TUBAL LIGATION      Gynecologic History: No LMP recorded. Patient has had a hysterectomy.  Obstetric History: [id number]  Family History:  Family History  Problem Relation Age of Onset  . Cancer Father        esophageal   . Hypertension Father   . Heart failure Mother   . Hypertension Mother   . Early death Mother   . Heart disease Mother        chf  . Breast cancer Sister 33  . Cancer Sister        breast  . Depression Sister   . Mental illness Sister   . Asthma Son   . Cancer Other        brain cancer    Social History:  Social History   Socioeconomic History  . Marital status: Married    Spouse name: Not on file  . Number of children: Not on file  . Years of education: Not on file  . Highest education  level: Not on file  Occupational History  . Not on file  Tobacco Use  . Smoking status: Never Smoker  . Smokeless tobacco: Current User    Types: Snuff  . Tobacco comment: Is in process of decreasing amount  Substance and Sexual Activity  . Alcohol use: No    Alcohol/week: 0.0 standard drinks  . Drug use: No  . Sexual activity: Yes    Comment: men  Other Topics Concern  . Not on file  Social History Narrative   Married    No guns    Wears seat belt    Safe in relationship    Never smoker    2 sons Gaspar Bidding ~10 and another son ~28 as of 10/2019   1 granson eli 52 y.o as of 10/2019   2 sisters    Unemployed       Social Determinants of Health   Financial Resource Strain: Low Risk   . Difficulty of Paying Living Expenses: Not hard at all  Food Insecurity: No Food Insecurity  . Worried About  Charity fundraiser in the Last Year: Never true  . Ran Out of Food in the Last Year: Never true  Transportation Needs: No Transportation Needs  . Lack of Transportation (Medical): No  . Lack of Transportation (Non-Medical): No  Physical Activity: Insufficiently Active  . Days of Exercise per Week: 3 days  . Minutes of Exercise per Session: 30 min  Stress: No Stress Concern Present  . Feeling of Stress : Not at all  Social Connections:   . Frequency of Communication with Friends and Family: Not on file  . Frequency of Social Gatherings with Friends and Family: Not on file  . Attends Religious Services: Not on file  . Active Member of Clubs or Organizations: Not on file  . Attends Archivist Meetings: Not on file  . Marital Status: Not on file  Intimate Partner Violence: Not At Risk  . Fear of Current or Ex-Partner: No  . Emotionally Abused: No  . Physically Abused: No  . Sexually Abused: No    Allergies:  Allergies  Allergen Reactions  . Cephalexin Swelling and Rash  . Hctz [Hydrochlorothiazide] Rash and Swelling    Mild she says  . Lisinopril Hives and Other (See Comments)    Headache, Low BP, angioedema    . Losartan Hives and Swelling    Angioedema  . Penicillins Rash    Has patient had a PCN reaction causing immediate rash, facial/tongue/throat swelling, SOB or lightheadedness with hypotension: Yes Has patient had a PCN reaction causing severe rash involving mucus membranes or skin necrosis: No Has patient had a PCN reaction that required hospitalization: No Has patient had a PCN reaction occurring within the last 10 years: No If all of the above answers are "NO", then may proceed with Cephalosporin use.   . Bee Venom     Swelling   . Metformin And Related     Rash     Medications: Prior to Admission medications   Medication Sig Start Date End Date Taking? Authorizing Provider  Acidophilus Lactobacillus CAPS Take 1 capsule by mouth daily. 10/20/19   Yes McLean-Scocuzza, Nino Glow, MD  amLODipine (NORVASC) 5 MG tablet Take 1 tablet (5 mg total) by mouth daily. 08/30/19 08/29/20 Yes McLean-Scocuzza, Nino Glow, MD  benztropine (COGENTIN) 1 MG tablet Take 1 mg by mouth daily as needed (anxiety).    Yes [provider]  cetirizine (ZYRTEC) 10 MG tablet  Take 1 tablet (10 mg total) by mouth daily as needed for allergies. 11/24/19  Yes McLean-Scocuzza, Nino Glow, MD  Ciclopirox 8 % KIT Apply 1 application topically daily. Next to skin on toenails daily and remove every 7 days with alcohol 08/30/19  Yes McLean-Scocuzza, Nino Glow, MD  EPINEPHrine 0.3 mg/0.3 mL IJ SOAJ injection Inject 0.3 mLs (0.3 mg total) into the muscle as needed for anaphylaxis. 02/19/19  Yes Merlyn Lot, MD  FLUoxetine (PROZAC) 20 MG capsule Take 20 mg by mouth daily.   Yes [provider]  fluPHENAZine (PROLIXIN) 2.5 MG tablet Take 2.5 mg by mouth at bedtime.    Yes [provider]  fluticasone (FLONASE) 50 MCG/ACT nasal spray Place 2 sprays into both nostrils daily. 08/30/19  Yes McLean-Scocuzza, Nino Glow, MD  gabapentin (NEURONTIN) 300 MG capsule Take 1 capsule (300 mg total) by mouth 3 (three) times daily. 08/30/19  Yes McLean-Scocuzza, Nino Glow, MD  Homeopathic Products [facility]) SUPP Place 1 suppository vaginally daily as needed. At night X 3-6 days only for fishy odor/discharge. Wash your hands before and after use. No sex while using 10/23/19  Yes McLean-Scocuzza, Nino Glow, MD  hydrOXYzine (ATARAX/VISTARIL) 25 MG tablet Take 1 tablet (25 mg total) by mouth every 8 (eight) hours as needed for itching. Or itchy rash do not take with zyrtec at the same time 08/30/19  Yes McLean-Scocuzza, Nino Glow, MD  metFORMIN (GLUCOPHAGE-XR) 500 MG 24 hr tablet  08/27/19  Yes [provider]  metoprolol succinate (TOPROL-XL) 25 MG 24 hr tablet Take 1 tablet (25 mg total) by mouth daily. Take with or immediately following a meal. 08/30/19  Yes McLean-Scocuzza, Nino Glow,  MD  metroNIDAZOLE (FLAGYL) 500 MG tablet Take 1 tablet (500 mg total) by mouth 2 (two) times daily. With food 10/25/19  Yes McLean-Scocuzza, Nino Glow, MD  oxyCODONE-acetaminophen (PERCOCET) 10-325 MG tablet Take 1 tablet by mouth every 6 (six) hours as needed for pain. Patient taking differently: Take 1 tablet by mouth every 8 (eight) hours as needed for pain (Dr. Mirna Mires pain).  01/12/18  Yes Johnathan Hausen, MD  pantoprazole (PROTONIX) 40 MG tablet Take 1 tablet (40 mg total) by mouth daily. 08/30/19  Yes McLean-Scocuzza, Nino Glow, MD  phentermine (ADIPEX-P) 37.5 MG tablet Take 1 tablet (37.5 mg total) by mouth daily before breakfast. 10/20/19  Yes McLean-Scocuzza, Nino Glow, MD  polyethylene glycol (MIRALAX / GLYCOLAX) packet Take 17 g by mouth daily as needed for mild constipation.   Yes [provider]  spironolactone (ALDACTONE) 25 MG tablet Take 0.5 tablets (12.5 mg total) by mouth daily. 08/30/19  Yes McLean-Scocuzza, Nino Glow, MD  terbinafine (LAMISIL) 250 MG tablet Take 1 tablet (250 mg total) by mouth daily. Dc after 3 months 08/30/19  Yes McLean-Scocuzza, Nino Glow, MD    Physical Exam Vitals:  Vitals:   11/29/19 1458  BP: 124/82  Pulse: 84   No LMP recorded. Patient has had a hysterectomy.  General: NAD, well nourished, appears stated age 17: normocephalic, anicteric Pulmonary: No increased work of breathing Genitourinary:  External: Normal external female genitalia.  Normal urethral meatus, normal Bartholin's and Skene's glands.    Vagina: Normal vaginal mucosa, no evidence of prolapse.    Cervix: flush with vaginal vault, os atrophic and and slightly stenotic  Uterus: surgically absent  Adnexa: ovaries non-enlarged, no adnexal masses  Rectal: deferred  Lymphatic: no evidence of inguinal lymphadenopathy Extremities: no edema, erythema, or tenderness Neurologic: Grossly intact Psychiatric: mood appropriate, affect full  Female chaperone present for pelvic  portions of  the physical exam  Assessment: 54 y.o. [id number] with recurrent vaginitis  Plan: Problem List Items Addressed This Visit    None    Visit Diagnoses    Recurrent vaginitis    -  Primary   Relevant Orders   NuSwab Vaginitis Plus (VG+)   Routine screening for STI (sexually transmitted infection)       Relevant Orders   NuSwab Vaginitis Plus (VG+)   Screening for malignant neoplasm of cervix       Relevant Orders   Cytology - PAP      1) Risk factors for bacterial vaginosis and candida infections discussed.  We discussed normal vaginal flora/microbiome.  Any factors that may alter the microbiome increase the risk of these opportunistic infections.  These include changes in pH, antibiotic exposures, diabetes, wet bathing suits etc.  We discussed that treatment is aimed at eradicating abnormal bacterial overgrowth and or yeast.  There may be some role for vaginal probiotics in restoring normal vaginal flora.   - pap - nuswab - Flagyl Rx - sample of hylafem  2) A total of 15 minutes were spent in face-to-face contact with the patient during this encounter with over half of that time devoted to counseling and coordination of care.  Prior labs reviewed    Malachy Mood, MD, [place] 11/29/2019, 3:32 PM

## 2019-12-01 DIAGNOSIS — F250 Schizoaffective disorder, bipolar type: Secondary | ICD-10-CM | POA: Diagnosis not present

## 2019-12-01 LAB — CYTOLOGY - PAP
Comment: NEGATIVE
Diagnosis: NEGATIVE
High risk HPV: NEGATIVE

## 2019-12-02 ENCOUNTER — Telehealth: Payer: Self-pay

## 2019-12-02 LAB — NUSWAB VAGINITIS PLUS (VG+)
Candida albicans, NAA: NEGATIVE
Candida glabrata, NAA: NEGATIVE
Chlamydia trachomatis, NAA: NEGATIVE
Neisseria gonorrhoeae, NAA: NEGATIVE
Trich vag by NAA: NEGATIVE

## 2019-12-02 NOTE — Telephone Encounter (Signed)
Pt unavailable by phone. I will send her a MyChart msg. We need more information on what medication

## 2019-12-02 NOTE — Telephone Encounter (Signed)
Pt called triage asking if AMS can send Rx to pharmacy he had told her he would.

## 2019-12-09 ENCOUNTER — Other Ambulatory Visit: Payer: Self-pay | Admitting: Obstetrics and Gynecology

## 2019-12-09 DIAGNOSIS — M545 Low back pain: Secondary | ICD-10-CM | POA: Diagnosis not present

## 2019-12-09 DIAGNOSIS — Z79891 Long term (current) use of opiate analgesic: Secondary | ICD-10-CM | POA: Diagnosis not present

## 2019-12-09 DIAGNOSIS — Z8709 Personal history of other diseases of the respiratory system: Secondary | ICD-10-CM | POA: Diagnosis not present

## 2019-12-09 DIAGNOSIS — G894 Chronic pain syndrome: Secondary | ICD-10-CM | POA: Diagnosis not present

## 2019-12-09 DIAGNOSIS — R05 Cough: Secondary | ICD-10-CM | POA: Diagnosis not present

## 2019-12-09 MED ORDER — NYSTATIN 100000 UNIT/ML MT SUSP: 5.0000 mL | Freq: Four times a day (QID) | OROMUCOSAL | 0 refills | Status: DC

## 2019-12-09 NOTE — Telephone Encounter (Signed)
Pt calling stating AMS was supposed to call in a powder to put under her breast and in groin area. [facility] [address]

## 2019-12-09 NOTE — Telephone Encounter (Signed)
It was nystatin and it was sent to [facility] on 11/29/2019, I've just resent it

## 2019-12-09 NOTE — Telephone Encounter (Signed)
Pt aware.

## 2019-12-26 ENCOUNTER — Other Ambulatory Visit: Payer: Self-pay | Admitting: Internal Medicine

## 2019-12-26 NOTE — Telephone Encounter (Signed)
Pt needs a refill on phentermine (ADIPEX-P) 37.5 MG tablet sent to [facility]

## 2019-12-27 ENCOUNTER — Encounter: Payer: Self-pay | Admitting: Obstetrics and Gynecology

## 2019-12-27 ENCOUNTER — Other Ambulatory Visit: Payer: Self-pay

## 2019-12-27 ENCOUNTER — Ambulatory Visit (INDEPENDENT_AMBULATORY_CARE_PROVIDER_SITE_OTHER): Payer: Medicare HMO | Admitting: Obstetrics and Gynecology

## 2019-12-27 VITALS — BP 128/76 | Wt 252.0 lb

## 2019-12-27 DIAGNOSIS — B356 Tinea cruris: Secondary | ICD-10-CM

## 2019-12-27 MED ORDER — NYSTATIN 100000 UNIT/GM EX CREA: 1.0000 "application " | TOPICAL_CREAM | Freq: Two times a day (BID) | CUTANEOUS | 1 refills | Status: DC

## 2019-12-27 MED ORDER — PHENTERMINE HCL 37.5 MG PO TABS: 37.5000 mg | ORAL_TABLET | Freq: Every day | ORAL | 0 refills | Status: DC

## 2019-12-27 NOTE — Telephone Encounter (Signed)
Pended for your approval or denial.  Last filled 10/20/19 with 60 day supply. Pt last seen 10/20/19.

## 2019-12-27 NOTE — Progress Notes (Signed)
Obstetrics & Gynecology Office Visit   Chief Complaint:  Chief Complaint  Patient presents with  . Follow-up    History of Present Illness: 54 y.o. [id number] presenting for medication follow up for a diagnosis of recurrent vaginal candida.  She is currently being managed with antibiotics.   The patient reports no improvement in symptoms.  On her current medication regimen has not had menstrual complaints secondary to history of prior hysterectomy.   She has not noted any side-effects or new symptoms.    Review of Systems: Review of Systems  Constitutional: Negative.   Gastrointestinal: Negative.   Genitourinary: Negative.    Past Medical History:  Past Medical History:  Diagnosis Date  . Allergic rhinitis   . Bipolar disorder ([place])   . Chronic low back pain   . CTS (carpal tunnel syndrome)    hands  . Depression   . Gallstones   . GERD (gastroesophageal reflux disease)   . History of prediabetes   . Hypertension   . Laceration of right foot   . Onychomycosis   . OSA on CPAP   . Overactive bladder   . PVC's (premature ventricular contractions)   . Seizures ([place])    8th grade    Past Surgical History:  Past Surgical History:  Procedure Laterality Date  . ABDOMINAL HYSTERECTOMY  07/08/2011  . ABDOMINAL HYSTERECTOMY     supracervical in 07/2011   . CARPAL TUNNEL RELEASE     right hand 1996  . CESAREAN SECTION     2012  . CHOLECYSTECTOMY N/A 01/12/2018   Procedure: LAPAROSCOPIC CHOLECYSTECTOMY WITH INTRAOPERATIVE CHOLANGIOGRAM ERAS PATHWAY;  Surgeon: Johnathan Hausen, MD;  Location: [facility] ORS;  Service: General;  Laterality: N/A;  . COLONOSCOPY WITH PROPOFOL N/A 04/15/2019   Procedure: COLONOSCOPY WITH PROPOFOL;  Surgeon: Jonathon Bellows, MD;  Location: [facility] ENDOSCOPY;  Service: Gastroenterology;  Laterality: N/A;  . DILATION AND CURETTAGE OF UTERUS     04/2011 benign endometrial polyp squamous metaplasia, inflamm, blood, mucous, benign   . FOOT SURGERY     Dr. Prudence Davidson   .  LEEP     2006  . TUBAL LIGATION      Gynecologic History: No LMP recorded. Patient has had a hysterectomy.  Obstetric History: [id number]  Family History:  Family History  Problem Relation Age of Onset  . Cancer Father        esophageal   . Hypertension Father   . Heart failure Mother   . Hypertension Mother   . Early death Mother   . Heart disease Mother        chf  . Breast cancer Sister 54  . Cancer Sister        breast  . Depression Sister   . Mental illness Sister   . Asthma Son   . Cancer Other        brain cancer    Social History:  Social History   Socioeconomic History  . Marital status: Married    Spouse name: Not on file  . Number of children: Not on file  . Years of education: Not on file  . Highest education level: Not on file  Occupational History  . Not on file  Tobacco Use  . Smoking status: Never Smoker  . Smokeless tobacco: Current User    Types: Snuff  . Tobacco comment: Is in process of decreasing amount  Substance and Sexual Activity  . Alcohol use: No    Alcohol/week: 0.0 standard drinks  .  Drug use: No  . Sexual activity: Yes    Comment: men  Other Topics Concern  . Not on file  Social History Narrative   Married    No guns    Wears seat belt    Safe in relationship    Never smoker    2 sons Gaspar Bidding ~10 and another son ~28 as of 10/2019   1 granson eli 1 y.o as of 10/2019   2 sisters    Unemployed       Social Determinants of Health   Financial Resource Strain: Low Risk   . Difficulty of Paying Living Expenses: Not hard at all  Food Insecurity: No Food Insecurity  . Worried About Charity fundraiser in the Last Year: Never true  . Ran Out of Food in the Last Year: Never true  Transportation Needs: No Transportation Needs  . Lack of Transportation (Medical): No  . Lack of Transportation (Non-Medical): No  Physical Activity: Insufficiently Active  . Days of Exercise per Week: 3 days  . Minutes of Exercise per Session: 30 min    Stress: No Stress Concern Present  . Feeling of Stress : Not at all  Social Connections:   . Frequency of Communication with Friends and Family:   . Frequency of Social Gatherings with Friends and Family:   . Attends Religious Services:   . Active Member of Clubs or Organizations:   . Attends Archivist Meetings:   Marland Kitchen Marital Status:   Intimate Partner Violence: Not At Risk  . Fear of Current or Ex-Partner: No  . Emotionally Abused: No  . Physically Abused: No  . Sexually Abused: No    Allergies:  Allergies  Allergen Reactions  . Cephalexin Swelling and Rash  . Hctz [Hydrochlorothiazide] Rash and Swelling    Mild she says  . Lisinopril Hives and Other (See Comments)    Headache, Low BP, angioedema    . Losartan Hives and Swelling    Angioedema  . Penicillins Rash    Has patient had a PCN reaction causing immediate rash, facial/tongue/throat swelling, SOB or lightheadedness with hypotension: Yes Has patient had a PCN reaction causing severe rash involving mucus membranes or skin necrosis: No Has patient had a PCN reaction that required hospitalization: No Has patient had a PCN reaction occurring within the last 10 years: No If all of the above answers are "NO", then may proceed with Cephalosporin use.   . Bee Venom     Swelling   . Metformin And Related     Rash     Medications: Prior to Admission medications   Medication Sig Start Date End Date Taking? Authorizing Provider  Acidophilus Lactobacillus CAPS Take 1 capsule by mouth daily. 10/20/19   McLean-Scocuzza, Nino Glow, MD  amLODipine (NORVASC) 5 MG tablet Take 1 tablet (5 mg total) by mouth daily. 08/30/19 08/29/20  McLean-Scocuzza, Nino Glow, MD  benztropine (COGENTIN) 1 MG tablet Take 1 mg by mouth daily as needed (anxiety).     [provider]  cetirizine (ZYRTEC) 10 MG tablet Take 1 tablet (10 mg total) by mouth daily as needed for allergies. 11/24/19   McLean-Scocuzza, Nino Glow, MD  Ciclopirox 8  % KIT Apply 1 application topically daily. Next to skin on toenails daily and remove every 7 days with alcohol 08/30/19   McLean-Scocuzza, Nino Glow, MD  EPINEPHrine 0.3 mg/0.3 mL IJ SOAJ injection Inject 0.3 mLs (0.3 mg total) into the muscle as needed for anaphylaxis. 02/19/19  Merlyn Lot, MD  FLUoxetine (PROZAC) 20 MG capsule Take 20 mg by mouth daily.    [provider]  fluPHENAZine (PROLIXIN) 2.5 MG tablet Take 2.5 mg by mouth at bedtime.     [provider]  fluticasone (FLONASE) 50 MCG/ACT nasal spray Place 2 sprays into both nostrils daily. 08/30/19   McLean-Scocuzza, Nino Glow, MD  gabapentin (NEURONTIN) 300 MG capsule Take 1 capsule (300 mg total) by mouth 3 (three) times daily. 08/30/19   McLean-Scocuzza, Nino Glow, MD  Homeopathic Products [facility]) SUPP Place 1 suppository vaginally daily as needed. At night X 3-6 days only for fishy odor/discharge. Wash your hands before and after use. No sex while using 10/23/19   McLean-Scocuzza, Nino Glow, MD  hydrOXYzine (ATARAX/VISTARIL) 25 MG tablet Take 1 tablet (25 mg total) by mouth every 8 (eight) hours as needed for itching. Or itchy rash do not take with zyrtec at the same time 08/30/19   McLean-Scocuzza, Nino Glow, MD  metFORMIN (GLUCOPHAGE-XR) 500 MG 24 hr tablet  08/27/19   [provider]  metoprolol succinate (TOPROL-XL) 25 MG 24 hr tablet Take 1 tablet (25 mg total) by mouth daily. Take with or immediately following a meal. 08/30/19   McLean-Scocuzza, Nino Glow, MD  metroNIDAZOLE (FLAGYL) 500 MG tablet Take 1 tablet (500 mg total) by mouth 2 (two) times daily. With food 10/25/19   McLean-Scocuzza, Nino Glow, MD  nystatin (MYCOSTATIN) 100000 UNIT/ML suspension Take 5 mLs (500,000 Units total) by mouth 4 (four) times daily. 12/09/19   Malachy Mood, MD  oxyCODONE-acetaminophen (PERCOCET) 10-325 MG tablet Take 1 tablet by mouth every 6 (six) hours as needed for pain. Patient taking differently: Take 1 tablet by mouth  every 8 (eight) hours as needed for pain (Dr. Mirna Mires pain).  01/12/18   Johnathan Hausen, MD  pantoprazole (PROTONIX) 40 MG tablet Take 1 tablet (40 mg total) by mouth daily. 08/30/19   McLean-Scocuzza, Nino Glow, MD  phentermine (ADIPEX-P) 37.5 MG tablet Take 1 tablet (37.5 mg total) by mouth daily before breakfast. 10/20/19   McLean-Scocuzza, Nino Glow, MD  polyethylene glycol (MIRALAX / GLYCOLAX) packet Take 17 g by mouth daily as needed for mild constipation.    [provider]  spironolactone (ALDACTONE) 25 MG tablet Take 0.5 tablets (12.5 mg total) by mouth daily. 08/30/19   McLean-Scocuzza, Nino Glow, MD  terbinafine (LAMISIL) 250 MG tablet Take 1 tablet (250 mg total) by mouth daily. Dc after 3 months 08/30/19   McLean-Scocuzza, Nino Glow, MD    Physical Exam Vitals:  Vitals:   12/27/19 1113  BP: 128/76   No LMP recorded. Patient has had a hysterectomy.  General: NAD HEENT: normocephalic, anicteric Pulmonary: No increased work of breathing Genitourinary:  External: Normal external female genitalia.  Normal urethral meatus, normal. Bartholin's and Skene's glands.  Still some erythema in groin folds  Vagina: Normal vaginal mucosa, no evidence of prolapse.    l lymphadenopathy Extremities: no edema, erythema, or tenderness Neurologic: Grossly intact Psychiatric: mood appropriate, affect full  Female chaperone present for pelvic  portions of the physical exam  Assessment: 54 y.o. [id number] follow up tinea  Plan: Problem List Items Addressed This Visit    None    Visit Diagnoses    Tinea cruris    -  Primary   Relevant Medications   nystatin cream (MYCOSTATIN)     - nystatin cream prn to continue  - Return in about 2 months (around 02/26/2020) for medication follow up.   Conan Bowens  Georgianne Fick, MD, [facility], [facility] 12/27/2019, 11:14 AM

## 2019-12-27 NOTE — Addendum Note (Signed)
Addended by: Thressa Sheller on: 12/27/2019 02:42 PM   Modules accepted: Orders

## 2020-01-03 DIAGNOSIS — Z79899 Other long term (current) drug therapy: Secondary | ICD-10-CM | POA: Diagnosis not present

## 2020-01-03 DIAGNOSIS — E119 Type 2 diabetes mellitus without complications: Secondary | ICD-10-CM | POA: Diagnosis not present

## 2020-01-04 ENCOUNTER — Telehealth: Payer: Self-pay | Admitting: Internal Medicine

## 2020-01-04 NOTE — Telephone Encounter (Signed)
Work in next week if worsening before then rec ED   [place]

## 2020-01-04 NOTE — Telephone Encounter (Signed)
Pt scheduled for 4/06 at 11 30

## 2020-01-04 NOTE — Telephone Encounter (Signed)
[facility] called about pt..  Wanting to let us know that she is having bilateral leg swelling and weakness. BP was good. Pt told them that she has gained over 30lbs in 74m but they didn't weigh her  [facility] is wanting [place] to work her in before 4/13 appt that was scheduled

## 2020-01-04 NOTE — Telephone Encounter (Signed)
Please advise 

## 2020-01-10 ENCOUNTER — Other Ambulatory Visit: Payer: Self-pay

## 2020-01-10 ENCOUNTER — Ambulatory Visit (INDEPENDENT_AMBULATORY_CARE_PROVIDER_SITE_OTHER): Payer: Medicare HMO | Admitting: Internal Medicine

## 2020-01-10 ENCOUNTER — Encounter: Payer: Self-pay | Admitting: Internal Medicine

## 2020-01-10 VITALS — BP 118/76 | HR 81 | Temp 97.6°F | Ht 63.0 in | Wt 250.2 lb

## 2020-01-10 DIAGNOSIS — R600 Localized edema: Secondary | ICD-10-CM | POA: Diagnosis not present

## 2020-01-10 DIAGNOSIS — M25561 Pain in right knee: Secondary | ICD-10-CM | POA: Diagnosis not present

## 2020-01-10 DIAGNOSIS — Z6841 Body Mass Index (BMI) 40.0 and over, adult: Secondary | ICD-10-CM | POA: Diagnosis not present

## 2020-01-10 DIAGNOSIS — I493 Ventricular premature depolarization: Secondary | ICD-10-CM

## 2020-01-10 DIAGNOSIS — E785 Hyperlipidemia, unspecified: Secondary | ICD-10-CM

## 2020-01-10 DIAGNOSIS — M25461 Effusion, right knee: Secondary | ICD-10-CM

## 2020-01-10 DIAGNOSIS — I340 Nonrheumatic mitral (valve) insufficiency: Secondary | ICD-10-CM

## 2020-01-10 DIAGNOSIS — G4733 Obstructive sleep apnea (adult) (pediatric): Secondary | ICD-10-CM | POA: Diagnosis not present

## 2020-01-10 DIAGNOSIS — I10 Essential (primary) hypertension: Secondary | ICD-10-CM | POA: Diagnosis not present

## 2020-01-10 DIAGNOSIS — G8929 Other chronic pain: Secondary | ICD-10-CM

## 2020-01-10 MED ORDER — PEN NEEDLES 30G X 8 MM MISC: 1.0000 | Freq: Every day | 3 refills | Status: DC

## 2020-01-10 MED ORDER — SAXENDA 18 MG/3ML ~~LOC~~ SOPN: PEN_INJECTOR | SUBCUTANEOUS | 5 refills | Status: DC

## 2020-01-10 NOTE — Patient Instructions (Addendum)
Try gabapentin 2x per day   Dr. Zella Richer weight loss surgery [place]   [facility] [place] [facility] [employer] [facility]   Dr. Raliegh Ip heart doctor [address]   Edema  Edema is an abnormal buildup of fluids in the body tissues and under the skin. Swelling of the legs, feet, and ankles is a common symptom that becomes more likely as you get older. Swelling is also common in looser tissues, like around the eyes. When the affected area is squeezed, the fluid may move out of that spot and leave a dent for a few moments. This dent is called pitting edema. There are many possible causes of edema. Eating too much salt (sodium) and being on your feet or sitting for a long time can cause edema in your legs, feet, and ankles. Hot weather may make edema worse. Common causes of edema include:  Heart failure.  Liver or kidney disease.  Weak leg blood vessels.  Cancer.  An injury.  Pregnancy.  Medicines.  Being obese.  Low protein levels in the blood. Edema is usually painless. Your skin may look swollen or shiny. Follow these instructions at home:  Keep the affected body part raised (elevated) above the level of your heart when you are sitting or lying down.  Do not sit still or stand for long periods of time.  Do not wear tight clothing. Do not wear garters on your upper legs.  Exercise your legs to get your circulation going. This helps to move the fluid back into your blood vessels, and it may help the swelling go down.  Wear elastic bandages or support stockings to reduce swelling as told by your health care provider.  Eat a low-salt (low-sodium) diet to reduce fluid as told by your health care provider.  Depending on the cause of your swelling, you may need to limit how much fluid you drink (fluid restriction).  Take over-the-counter and prescription medicines only as told by your health care provider. Contact a health care provider  if:  Your edema does not get better with treatment.  You have heart, liver, or kidney disease and have symptoms of edema.  You have sudden and unexplained weight gain. Get help right away if:  You develop shortness of breath or chest pain.  You cannot breathe when you lie down.  You develop pain, redness, or warmth in the swollen areas.  You have heart, liver, or kidney disease and suddenly get edema.  You have a fever and your symptoms suddenly get worse. Summary  Edema is an abnormal buildup of fluids in the body tissues and under the skin.  Eating too much salt (sodium) and being on your feet or sitting for a long time can cause edema in your legs, feet, and ankles.  Keep the affected body part raised (elevated) above the level of your heart when you are sitting or lying down. This information is not intended to replace advice given to you by your health care provider. Make sure you discuss any questions you have with your health care provider. Document Revised: 02/09/2019 Document Reviewed: 10/25/2016 Elsevier Patient Education  2020 [place].   Nonalcoholic Fatty Liver Disease Diet, Adult Nonalcoholic fatty liver disease is a condition that causes fat to build up in and around the liver. The disease makes it harder for the liver to work the way that it should. Following a healthy diet can help to keep nonalcoholic fatty liver  disease under control. It can also help to prevent or improve conditions that are associated with the disease, such as heart disease, diabetes, high blood pressure, and abnormal cholesterol levels. Along with regular exercise, this diet:  Promotes weight loss.  Helps to control blood sugar levels.  Helps to improve the way that the body uses insulin. What are tips for following this plan? Reading food labels Always check food labels for:  The amount of saturated fat in a food. You should limit your intake of saturated fat. Saturated fat is  found in foods that come from animals, including meat and dairy products such as butter, cheese, and whole milk.  The amount of fiber in a food. You should choose high-fiber foods such as fruits, vegetables, and whole grains. Try to get 25-30 grams (g) of fiber a day.  Cooking  When cooking, use heart-healthy oils that are high in monounsaturated fats. These include olive oil, canola oil, and avocado oil.  Limit frying or deep-frying foods. Cook foods using healthy methods such as baking, boiling, steaming, and grilling instead. Meal planning  You may want to keep track of how many calories you take in. Eating the right amount of calories will help you achieve a healthy weight. Meeting with a registered dietitian can help you get started.  Limit how often you eat takeout and fast food. These foods are usually very high in fat, salt, and sugar.  Use the glycemic index (GI) to plan your meals. The index tells you how quickly a food will raise your blood sugar. Choose low-GI foods (GI less than 55). These foods take a longer time to raise blood sugar. A registered dietitian can help you identify foods lower on the GI scale. Lifestyle  You may want to follow a Mediterranean diet. This diet includes a lot of vegetables, lean meats or fish, whole grains, fruits, and healthy oils and fats. What foods can I eat?  Fruits Bananas. Apples. Oranges. Grapes. Papaya. Mango. Pomegranate. Kiwi. Grapefruit. Cherries. Vegetables Lettuce. Spinach. Peas. Beets. Cauliflower. Cabbage. Broccoli. Carrots. Tomatoes. Squash. Eggplant. Herbs. Peppers. Onions. Cucumbers. Brussels sprouts. Yams and sweet potatoes. Beans. Lentils. Grains Whole wheat or whole-grain foods, including breads, crackers, cereals, and pasta. Stone-ground whole wheat. Unsweetened oatmeal. Bulgur. Barley. Quinoa. Brown or wild rice. Corn or whole wheat flour tortillas. Meats and other proteins Lean meats. Poultry. Tofu. Seafood and  shellfish. Dairy Low-fat or fat-free dairy products, such as yogurt, cottage cheese, or cheese. Beverages Water. Sugar-free drinks. Tea. Coffee. Low-fat or skim milk. Milk alternatives, such as soy or almond milk. Real fruit juice. Fats and oils Avocado. Canola or olive oil. Nuts and nut butters. Seeds. Seasonings and condiments Mustard. Relish. Low-fat, low-sugar ketchup and barbecue sauce. Low-fat or fat-free mayonnaise. Sweets and desserts Sugar-free sweets. The items listed above may not be a complete list of foods and beverages you can eat. Contact a dietitian for more information. What foods should I limit or avoid? Meats and other proteins Limit red meat to 1-2 times a week. Dairy [employer]. Fats and oils Palm oil and coconut oil. Fried foods. Other foods Processed foods. Foods that contain a lot of salt or sodium. Sweets and desserts Sweets that contain sugar. Beverages Sweetened drinks, such as sweet tea, milkshakes, iced sweet drinks, and sodas. Alcohol. The items listed above may not be a complete list of foods and beverages you should avoid. Contact a dietitian for more information. Where to find more information The [employer] of Diabetes and  Digestive and Kidney Diseases: [employer] Summary  Nonalcoholic fatty liver disease is a condition that causes fat to build up in and around the liver.  Following a healthy diet can help to keep nonalcoholic fatty liver disease under control. Your diet should be rich in fruits, vegetables, whole grains, and lean proteins.  Limit your intake of saturated fat. Saturated fat is found in foods that come from animals, including meat and dairy products such as butter, cheese, and whole milk.  This diet promotes weight loss, helps to control blood sugar levels, and helps to improve the way that the body uses insulin. This information is not intended to replace advice given to you by your health care provider. Make sure  you discuss any questions you have with your health care provider. Document Revised: 01/14/2019 Document Reviewed: 10/14/2018 Elsevier Patient Education  [place].  Fatty Liver Disease  Fatty liver disease occurs when too much fat has built up in your liver cells. Fatty liver disease is also called hepatic steatosis or steatohepatitis. The liver removes harmful substances from your bloodstream and produces fluids that your body needs. It also helps your body use and store energy from the food you eat. In many cases, fatty liver disease does not cause symptoms or problems. It is often diagnosed when tests are being done for other reasons. However, over time, fatty liver can cause inflammation that may lead to more serious liver problems, such as scarring of the liver (cirrhosis) and liver failure. Fatty liver is associated with insulin resistance, increased body fat, high blood pressure (hypertension), and high cholesterol. These are features of metabolic syndrome and increase your risk for stroke, diabetes, and heart disease. What are the causes? This condition may be caused by:  Drinking too much alcohol.  Poor nutrition.  Obesity.  Cushing's syndrome.  Diabetes.  High cholesterol.  Certain drugs.  Poisons.  Some viral infections.  Pregnancy. What increases the risk? You are more likely to develop this condition if you:  Abuse alcohol.  Are overweight.  Have diabetes.  Have hepatitis.  Have a high triglyceride level.  Are pregnant. What are the signs or symptoms? Fatty liver disease often does not cause symptoms. If symptoms do develop, they can include:  Fatigue.  Weakness.  Weight loss.  Confusion.  Abdominal pain.  Nausea and vomiting.  Yellowing of your skin and the white parts of your eyes (jaundice).  Itchy skin. How is this diagnosed? This condition may be diagnosed by:  A physical exam and medical history.  Blood tests.  Imaging  tests, such as an ultrasound, CT scan, or MRI.  A liver biopsy. A small sample of liver tissue is removed using a needle. The sample is then looked at under a microscope. How is this treated? Fatty liver disease is often caused by other health conditions. Treatment for fatty liver may involve medicines and lifestyle changes to manage conditions such as:  Alcoholism.  High cholesterol.  Diabetes.  Being overweight or obese. Follow these instructions at home:   Do not drink alcohol. If you have trouble quitting, ask your health care provider how to safely quit with the help of medicine or a supervised program. This is important to keep your condition from getting worse.  Eat a healthy diet as told by your health care provider. Ask your health care provider about working with a diet and nutrition specialist (dietitian) to develop an eating plan.  Exercise regularly. This can help you lose weight and control  your cholesterol and diabetes. Talk to your health care provider about an exercise plan and which activities are best for you.  Take over-the-counter and prescription medicines only as told by your health care provider.  Keep all follow-up visits as told by your health care provider. This is important. Contact a health care provider if: You have trouble controlling your:  Blood sugar. This is especially important if you have diabetes.  Cholesterol.  Drinking of alcohol. Get help right away if:  You have abdominal pain.  You have jaundice.  You have nausea and vomiting.  You vomit blood or material that looks like coffee grounds.  You have stools that are black, tar-like, or bloody. Summary  Fatty liver disease develops when too much fat builds up in the cells of your liver.  Fatty liver disease often causes no symptoms or problems. However, over time, fatty liver can cause inflammation that may lead to more serious liver problems, such as scarring of the liver  (cirrhosis).  You are more likely to develop this condition if you abuse alcohol, are pregnant, are overweight, have diabetes, have hepatitis, or have high triglyceride levels.  Contact your health care provider if you have trouble controlling your weight, blood sugar, cholesterol, or drinking of alcohol. This information is not intended to replace advice given to you by your health care provider. Make sure you discuss any questions you have with your health care provider. Document Revised: 09/04/2017 Document Reviewed: 07/01/2017 Elsevier Patient Education  2020 [employer].

## 2020-01-10 NOTE — Progress Notes (Signed)
Chief Complaint  Patient presents with  . Leg Swelling    bilateral lega swelling. pt would like to discuss rx for full leg compression stockings that go up her thighs too   F/u  1. Leg edema b/l wants Rx compression stockings legs feel tight with mild swelling today denies extra salt in diet  2. Morbid obesity been in adipex in the past w/o help, victoza helped in the past. Wants Saxenda sent to [facility] and referral for weight loss surgeyr  3. C/o right knee swelling w/o pain today inner knee swelling nothing tried wants referral to ortho to see if could be bursitis  4. HTN controlled on spironolactone 25, norvasc 5, toprol 25 mg xl with multiple allergies to HTN meds in the past including angioedema   Review of Systems  Constitutional: Negative for weight loss.  HENT: Negative for hearing loss.   Eyes: Negative for blurred vision.  Respiratory: Negative for shortness of breath.   Cardiovascular: Positive for leg swelling. Negative for chest pain.  Musculoskeletal: Negative for falls and joint pain.  Skin: Negative for rash.  Neurological: Negative for headaches.  Psychiatric/Behavioral: Negative for depression.   Past Medical History:  Diagnosis Date  . Allergic rhinitis   . Bipolar disorder ([place])   . Chronic low back pain   . CTS (carpal tunnel syndrome)    hands  . Depression   . Gallstones   . GERD (gastroesophageal reflux disease)   . History of prediabetes   . Hypertension   . Laceration of right foot   . Onychomycosis   . OSA on CPAP   . Overactive bladder   . PVC's (premature ventricular contractions)   . Seizures ([place])    8th grade   Past Surgical History:  Procedure Laterality Date  . ABDOMINAL HYSTERECTOMY  07/08/2011  . ABDOMINAL HYSTERECTOMY     supracervical in 07/2011   . CARPAL TUNNEL RELEASE     right hand 1996  . CESAREAN SECTION     2012  . CHOLECYSTECTOMY N/A 01/12/2018   Procedure: LAPAROSCOPIC CHOLECYSTECTOMY WITH INTRAOPERATIVE CHOLANGIOGRAM  ERAS PATHWAY;  Surgeon: Johnathan Hausen, MD;  Location: [facility] ORS;  Service: General;  Laterality: N/A;  . COLONOSCOPY WITH PROPOFOL N/A 04/15/2019   Procedure: COLONOSCOPY WITH PROPOFOL;  Surgeon: Jonathon Bellows, MD;  Location: [facility] ENDOSCOPY;  Service: Gastroenterology;  Laterality: N/A;  . DILATION AND CURETTAGE OF UTERUS     04/2011 benign endometrial polyp squamous metaplasia, inflamm, blood, mucous, benign   . FOOT SURGERY     Dr. Prudence Davidson   . LEEP     2006  . TUBAL LIGATION     Family History  Problem Relation Age of Onset  . Cancer Father        esophageal   . Hypertension Father   . Heart failure Mother   . Hypertension Mother   . Early death Mother   . Heart disease Mother        chf  . Breast cancer Sister 66  . Cancer Sister        breast  . Depression Sister   . Mental illness Sister   . Asthma Son   . Cancer Other        brain cancer   Social History   Socioeconomic History  . Marital status: Married    Spouse name: Not on file  . Number of children: Not on file  . Years of education: Not on file  . Highest education level: Not on  file  Occupational History  . Not on file  Tobacco Use  . Smoking status: Never Smoker  . Smokeless tobacco: Current User    Types: Snuff  . Tobacco comment: Is in process of decreasing amount  Substance and Sexual Activity  . Alcohol use: No    Alcohol/week: 0.0 standard drinks  . Drug use: No  . Sexual activity: Yes    Comment: men  Other Topics Concern  . Not on file  Social History Narrative   Married    No guns    Wears seat belt    Safe in relationship    Never smoker    2 sons Gaspar Bidding ~10 and another son ~28 as of 10/2019   1 granson eli 1 y.o as of 10/2019   2 sisters    Unemployed       Social Determinants of Health   Financial Resource Strain: Low Risk   . Difficulty of Paying Living Expenses: Not hard at all  Food Insecurity: No Food Insecurity  . Worried About Charity fundraiser in the Last Year: Never true   . Ran Out of Food in the Last Year: Never true  Transportation Needs: No Transportation Needs  . Lack of Transportation (Medical): No  . Lack of Transportation (Non-Medical): No  Physical Activity: Insufficiently Active  . Days of Exercise per Week: 3 days  . Minutes of Exercise per Session: 30 min  Stress: No Stress Concern Present  . Feeling of Stress : Not at all  Social Connections:   . Frequency of Communication with Friends and Family:   . Frequency of Social Gatherings with Friends and Family:   . Attends Religious Services:   . Active Member of Clubs or Organizations:   . Attends Archivist Meetings:   Marland Kitchen Marital Status:   Intimate Partner Violence: Not At Risk  . Fear of Current or Ex-Partner: No  . Emotionally Abused: No  . Physically Abused: No  . Sexually Abused: No   Current Meds  Medication Sig  . Acidophilus Lactobacillus CAPS Take 1 capsule by mouth daily.  Marland Kitchen amLODipine (NORVASC) 5 MG tablet Take 1 tablet (5 mg total) by mouth daily.  . APPLE CIDER VINEGAR PO Take by mouth.  . B Complex-C (B-COMPLEX WITH VITAMIN C) tablet Take 1 tablet by mouth daily.  . benztropine (COGENTIN) 1 MG tablet Take 1 mg by mouth daily as needed (anxiety).   . cetirizine (ZYRTEC) 10 MG tablet Take 1 tablet (10 mg total) by mouth daily as needed for allergies.  . Ciclopirox 8 % KIT Apply 1 application topically daily. Next to skin on toenails daily and remove every 7 days with alcohol  . EPINEPHrine 0.3 mg/0.3 mL IJ SOAJ injection Inject 0.3 mLs (0.3 mg total) into the muscle as needed for anaphylaxis.  Marland Kitchen FLUoxetine (PROZAC) 20 MG capsule Take 20 mg by mouth daily.  . fluPHENAZine (PROLIXIN) 2.5 MG tablet Take 2.5 mg by mouth at bedtime.   . fluticasone (FLONASE) 50 MCG/ACT nasal spray Place 2 sprays into both nostrils daily.  Marland Kitchen gabapentin (NEURONTIN) 300 MG capsule Take 1 capsule (300 mg total) by mouth 3 (three) times daily.  . hydrOXYzine (ATARAX/VISTARIL) 25 MG tablet Take  1 tablet (25 mg total) by mouth every 8 (eight) hours as needed for itching. Or itchy rash do not take with zyrtec at the same time  . metFORMIN (GLUCOPHAGE-XR) 500 MG 24 hr tablet   . metoprolol succinate (TOPROL-XL) 25 MG  24 hr tablet Take 1 tablet (25 mg total) by mouth daily. Take with or immediately following a meal.  . nystatin cream (MYCOSTATIN) Apply 1 application topically 2 (two) times daily.  Marland Kitchen oxyCODONE-acetaminophen (PERCOCET) 10-325 MG tablet Take 1 tablet by mouth every 6 (six) hours as needed for pain. (Patient taking differently: Take 1 tablet by mouth every 8 (eight) hours as needed for pain (Dr. Mirna Mires pain). )  . pantoprazole (PROTONIX) 40 MG tablet Take 1 tablet (40 mg total) by mouth daily.  . polyethylene glycol (MIRALAX / GLYCOLAX) packet Take 17 g by mouth daily as needed for mild constipation.  Marland Kitchen spironolactone (ALDACTONE) 25 MG tablet Take 0.5 tablets (12.5 mg total) by mouth daily.  Marland Kitchen terbinafine (LAMISIL) 250 MG tablet Take 1 tablet (250 mg total) by mouth daily. Dc after 3 months   Allergies  Allergen Reactions  . Cephalexin Swelling and Rash  . Hctz [Hydrochlorothiazide] Rash and Swelling    Mild she says  . Lisinopril Hives and Other (See Comments)    Headache, Low BP, angioedema    . Losartan Hives and Swelling    Angioedema  . Penicillins Rash    Has patient had a PCN reaction causing immediate rash, facial/tongue/throat swelling, SOB or lightheadedness with hypotension: Yes Has patient had a PCN reaction causing severe rash involving mucus membranes or skin necrosis: No Has patient had a PCN reaction that required hospitalization: No Has patient had a PCN reaction occurring within the last 10 years: No If all of the above answers are "NO", then may proceed with Cephalosporin use.   . Bee Venom     Swelling   . Metformin And Related     Rash    Recent Results (from the past 2160 hour(s))  CBC w/Diff     Status: Abnormal   Collection Time:  11/21/19 11:12 AM  Result Value Ref Range   WBC 5.6 4.0 - 10.5 K/uL   RBC 4.33 3.87 - 5.11 Mil/uL   Hemoglobin 12.2 12.0 - 15.0 g/dL   HCT 35.5 (L) 36.0 - 46.0 %   MCV 82.0 78.0 - 100.0 fl   MCHC 34.4 30.0 - 36.0 g/dL   RDW 13.4 11.5 - 15.5 %   Platelets 207.0 150.0 - 400.0 K/uL   Neutrophils Relative % 46.1 43.0 - 77.0 %   Lymphocytes Relative 38.3 12.0 - 46.0 %   Monocytes Relative 10.0 3.0 - 12.0 %   Eosinophils Relative 5.3 (H) 0.0 - 5.0 %   Basophils Relative 0.3 0.0 - 3.0 %   Neutro Abs 2.6 1.4 - 7.7 K/uL   Lymphs Abs 2.1 0.7 - 4.0 K/uL   Monocytes Absolute 0.6 0.1 - 1.0 K/uL   Eosinophils Absolute 0.3 0.0 - 0.7 K/uL   Basophils Absolute 0.0 0.0 - 0.1 K/uL  Lipid panel     Status: None   Collection Time: 11/21/19 11:12 AM  Result Value Ref Range   Cholesterol 146 0 - 200 mg/dL    Comment: ATP III Classification       Desirable:  < 200 mg/dL               Borderline High:  200 - 239 mg/dL          High:  > = 240 mg/dL   Triglycerides 63.0 0.0 - 149.0 mg/dL    Comment: Normal:  <150 mg/dLBorderline High:  150 - 199 mg/dL   HDL 53.30 >39.00 mg/dL   VLDL 12.6 0.0 - 40.0  mg/dL   LDL Cholesterol 80 0 - 99 mg/dL   Total CHOL/HDL Ratio 3     Comment:                Men          Women1/2 Average Risk     3.4          3.3Average Risk          5.0          4.42X Average Risk          9.6          7.13X Average Risk          15.0          11.0                       NonHDL 92.62     Comment: NOTE:  Non-HDL goal should be 30 mg/dL higher than patient's LDL goal (i.e. LDL goal of < 70 mg/dL, would have non-HDL goal of < 100 mg/dL)  Comprehensive metabolic panel     Status: None   Collection Time: 11/21/19 11:12 AM  Result Value Ref Range   Sodium 138 135 - 145 mEq/L   Potassium 4.0 3.5 - 5.1 mEq/L   Chloride 104 96 - 112 mEq/L   CO2 28 19 - 32 mEq/L   Glucose, Bld 96 70 - 99 mg/dL   BUN 11 6 - 23 mg/dL   Creatinine, Ser 0.98 0.40 - 1.20 mg/dL   Total Bilirubin 0.3 0.2 - 1.2 mg/dL    Alkaline Phosphatase 74 39 - 117 U/L   AST 13 0 - 37 U/L   ALT 14 0 - 35 U/L   Total Protein 7.2 6.0 - 8.3 g/dL   Albumin 4.2 3.5 - 5.2 g/dL   GFR 72.27 >60.00 mL/min   Calcium 9.1 8.4 - 10.5 mg/dL  Cytology - PAP     Status: None   Collection Time: 11/29/19  3:32 PM  Result Value Ref Range   High risk HPV Negative    Adequacy Satisfactory for evaluation.    Diagnosis      - Negative for intraepithelial lesion or malignancy (NILM)   Comment Normal Reference Range HPV - Negative   NuSwab Vaginitis Plus (VG+)     Status: None   Collection Time: 11/29/19  4:00 PM  Result Value Ref Range   Atopobium vaginae Low - 0 Score   BVAB 2 Low - 0 Score   Megasphaera 1 Low - 0 Score    Comment: Calculate total score by adding the 3 individual bacterial vaginosis (BV) marker scores together.  Total score is interpreted as follows: Total score 0-1: Indicates the absence of BV. Total score   2: Indeterminate for BV. Additional clinical                  data should be evaluated to establish a                  diagnosis. Total score 3-6: Indicates the presence of BV. This test was developed and its performance characteristics determined by [facility].  It has not been cleared or approved by the Food and Drug Administration.    Candida albicans, NAA Negative Negative   Candida glabrata, NAA Negative Negative   Trich vag by NAA Negative Negative   Chlamydia trachomatis, NAA Negative Negative   Neisseria gonorrhoeae, NAA Negative Negative  Objective  Body mass index is 44.32 kg/m. Wt Readings from Last 3 Encounters:  01/10/20 250 lb 3.2 oz (113.5 kg)  12/27/19 252 lb (114.3 kg)  11/29/19 256 lb (116.1 kg)   Temp Readings from Last 3 Encounters:  01/10/20 97.6 F (36.4 C) (Temporal)  08/19/19 98 F (36.7 C) (Skin)  05/05/19 (!) 97.1 F (36.2 C)   BP Readings from Last 3 Encounters:  01/10/20 118/76  12/27/19 128/76  11/29/19 124/82   Pulse Readings from Last 3 Encounters:   01/10/20 81  11/29/19 84  08/19/19 89    Physical Exam Vitals and nursing note reviewed.  Constitutional:      Appearance: Normal appearance. She is well-developed and well-groomed. She is morbidly obese.  HENT:     Head: Normocephalic and atraumatic.  Eyes:     Conjunctiva/sclera: Conjunctivae normal.     Pupils: Pupils are equal, round, and reactive to light.  Cardiovascular:     Rate and Rhythm: Normal rate and regular rhythm.     Heart sounds: Normal heart sounds. No murmur.     Comments: Mild leg edema b/l legs  Pulmonary:     Effort: Pulmonary effort is normal.     Breath sounds: Normal breath sounds.  Musculoskeletal:       Legs:  Skin:    General: Skin is warm and dry.  Neurological:     General: No focal deficit present.     Mental Status: She is alert and oriented to person, place, and time. Mental status is at baseline.     Gait: Gait normal.  Psychiatric:        Attention and Perception: Attention and perception normal.        Mood and Affect: Mood and affect normal.        Speech: Speech normal.        Behavior: Behavior normal. Behavior is cooperative.        Thought Content: Thought content normal.        Cognition and Memory: Cognition and memory normal.        Judgment: Judgment normal.     Assessment  Plan  Leg edema - Plan: Ambulatory referral to Cardiology rx compression stockings 15-20 mmhg  Repeat echo with cards  Elevate legs  Low salt    Morbid obesity with BMI of 40.0-44.9, adult ([place]) - Plan: Ambulatory referral to General Surgery, Liraglutide -Weight Management (SAXENDA) 18 MG/3ML SOPN, Insulin Pen Needle (PEN NEEDLES) 30G X 8 MM MISC  Frequent PVCs - Plan: Ambulatory referral to Cardiology  Essential hypertension - Plan:  Cont meds controlled today   Mitral valve insufficiency, unspecified etiology - Plan: Ambulatory referral to Cardiology Consider repeat echo  Pain and swelling of right knee - Plan: Ambulatory referral to  Orthopedic Surgery Dr. Harlow Mares r/o arthritis vs bursitis    Hyperlipidemia, unspecified hyperlipidemia type - Controlled   HM Declines flu shotallergic Tdap utd due 04/18/2019 will do in future at f/u  Allergic to pna vaccine Consider shingrix but given allergies use caution Not immuneMMR, hep B titer in futureuse caution though with h/o allergies with all vaccines Not had covid 19 vaccine  h/o supracervical hysterectomy ([facility]), pap 02/29/16 negative no comment HPV, another pap 06/23/17 neg pap neg HPV  -h/o LEEP 2006  mammo 08/04/19 negative  Colonoscopy7/10/20 normal f/u in 10 years  Echo Dr.Kowalski  -nl LV function EF 60% moderate mitral insuff and mild tricuspid insufficiency treadmill stress test neg 07/30/13  -referred again today  HIV neg 02/04/16  Ob/gyn appt pending bladder leakage [facility]pped PT w/o help worsen sneezing or coughing) -Dr. Georgianne Fick pt wants surgery   Provider: Dr. Olivia Mackie McLean-Scocuzza-Internal Medicine

## 2020-01-11 DIAGNOSIS — M1711 Unilateral primary osteoarthritis, right knee: Secondary | ICD-10-CM | POA: Diagnosis not present

## 2020-01-16 DIAGNOSIS — M1711 Unilateral primary osteoarthritis, right knee: Secondary | ICD-10-CM | POA: Diagnosis not present

## 2020-01-16 DIAGNOSIS — M2391 Unspecified internal derangement of right knee: Secondary | ICD-10-CM | POA: Diagnosis not present

## 2020-01-16 DIAGNOSIS — Z6841 Body Mass Index (BMI) 40.0 and over, adult: Secondary | ICD-10-CM | POA: Diagnosis not present

## 2020-01-17 ENCOUNTER — Ambulatory Visit: Payer: Self-pay | Admitting: Pharmacist

## 2020-01-17 ENCOUNTER — Ambulatory Visit: Payer: Medicare HMO | Admitting: Internal Medicine

## 2020-01-17 NOTE — Chronic Care Management (AMB) (Signed)
  Chronic Care Management   Note  01/17/2020 Name: Heather Sweeney MRN: [id number] DOB: 1966-01-16  Received following message from [facility] Answering Service:   Patient Name:          Heather Sweeney PatientPhone:          [phone] NumericDOB:            06-14-1966 Health Plan:            Mcarthur Rossetti Best Time to Call:            Anytime Primary Care Provider:            Dr. Loman Chroman  Message:               [facility] gave her your phone number to get help with prescriptions.  Please call back.   Placing CCM referral. My scheduling team will outreach to consent and schedule     Catie [name], PharmD, [place], [place] Pharmacist [place] [phone]

## 2020-01-18 ENCOUNTER — Telehealth: Payer: Self-pay | Admitting: Internal Medicine

## 2020-01-18 NOTE — Telephone Encounter (Signed)
[employer] called to initiate a verbal PA for patient's Saxenda. Informed of DX codes, this was for recertification as patient had been on this medication before, and this was to treat weight loss.    Diagnosis Association: Morbid obesity ([place]) (E66.01); Morbid obesity with BMI of 40.0-44.9, adult (HCC) (E66.01 , Z68.41); Essential hypertension (I10); OSA (obstructive sleep apnea) (G47.33); Hyperlipidemia, unspecified hyperlipidemia type (E78.5)  Original Order:  Liraglutide -Weight Management ([place]) 18 MG/3ML SOPN [id number]   They will fax over the results of the PA

## 2020-01-19 ENCOUNTER — Encounter: Payer: Self-pay | Admitting: Podiatry

## 2020-01-19 ENCOUNTER — Ambulatory Visit: Payer: Medicare HMO | Admitting: Internal Medicine

## 2020-01-19 ENCOUNTER — Ambulatory Visit (INDEPENDENT_AMBULATORY_CARE_PROVIDER_SITE_OTHER): Payer: Medicare HMO | Admitting: Podiatry

## 2020-01-19 ENCOUNTER — Other Ambulatory Visit: Payer: Self-pay

## 2020-01-19 VITALS — Temp 97.9°F

## 2020-01-19 DIAGNOSIS — B351 Tinea unguium: Secondary | ICD-10-CM

## 2020-01-19 DIAGNOSIS — M79676 Pain in unspecified toe(s): Secondary | ICD-10-CM | POA: Diagnosis not present

## 2020-01-19 NOTE — Progress Notes (Signed)
This patient returns to the office for evaluation and treatment of long thick painful nails .  This patient is unable to trim his own nails since the patient cannot reach the feet.  Patient says the nails are painful walking and wearing her shoes.  She returns for preventive foot care services.  General Appearance  Alert, conversant and in no acute stress.  Vascular  Dorsalis pedis and posterior tibial  pulses are palpable  bilaterally.  Capillary return is within normal limits  bilaterally. Temperature is within normal limits  bilaterally.  Neurologic  Senn-Weinstein monofilament wire test within normal limits  bilaterally. Muscle power within normal limits bilaterally.  Nails Thick disfigured discolored nails with subungual debris  from hallux to fifth toes bilaterally. No evidence of bacterial infection or drainage bilaterally.  Orthopedic  No limitations of motion  feet .  No crepitus or effusions noted.  No bony pathology or digital deformities noted.  Skin  normotropic skin with no porokeratosis noted bilaterally.  No signs of infections or ulcers noted.     Onychomycosis  Pain in toes right foot  Pain in toes left foot  Debridement  of nails  1-5  B/L with a nail nipper.  Nails were then filed using a dremel tool with no incidents.    RTC 3 months    Quintarius Ferns DPM  

## 2020-01-23 ENCOUNTER — Telehealth: Payer: Self-pay | Admitting: Internal Medicine

## 2020-01-23 DIAGNOSIS — F310 Bipolar disorder, current episode hypomanic: Secondary | ICD-10-CM | POA: Diagnosis not present

## 2020-01-23 DIAGNOSIS — F411 Generalized anxiety disorder: Secondary | ICD-10-CM | POA: Diagnosis not present

## 2020-01-23 NOTE — Telephone Encounter (Signed)
Pt called in and needs a referral to [facility]. She said the one she was going to in [facility] has closed. Please fax to [phone] and their phone number is [phone].

## 2020-01-23 NOTE — Telephone Encounter (Signed)
Please advise 

## 2020-01-23 NOTE — Addendum Note (Signed)
Addended by: Orland Mustard on: 01/23/2020 10:06 PM   Modules accepted: Orders

## 2020-01-23 NOTE — Telephone Encounter (Signed)
Referrals placed   Thanks Nora

## 2020-01-24 NOTE — Telephone Encounter (Signed)
Faxed Denial for Saxenda to [facility] on 01/24/20 Fax # [phone]

## 2020-01-24 NOTE — Telephone Encounter (Signed)
PA for Saxenda faxed to [facility] on 01/24/20  Fax # [phone]

## 2020-01-25 DIAGNOSIS — Z79899 Other long term (current) drug therapy: Secondary | ICD-10-CM | POA: Diagnosis not present

## 2020-01-26 NOTE — Telephone Encounter (Signed)
Referral ofc notes ins card was fax to number [phone] ofc number is [phone] Dr Mortimer Fries Satira Mccallum

## 2020-02-02 ENCOUNTER — Ambulatory Visit: Payer: Medicare HMO | Admitting: Pharmacist

## 2020-02-02 NOTE — Patient Instructions (Signed)
Ms. Jacek,   Please bring ALL OF YOUR MEDICATIONS to the office on Monday, May 10th for a 3:30 appointment. Please arrive by 3:15. We will go through all of your medications and answer questions.   Please call me if you need to reschedule for any reason.   Thanks! [place], PharmD [phone]

## 2020-02-02 NOTE — Chronic Care Management (AMB) (Signed)
  Chronic Care Management   Note  02/02/2020 Name: Heather Sweeney MRN: [id number] DOB: 1966/07/13  [name] is a 54 y.o. year old female who is a primary care patient of McLean-Scocuzza, Nino Glow, MD. The CCM team was consulted for assistance with chronic disease management and care coordination needs.    Contacted patient as previously scheduled for medication review; however, patient wasn't home and didn't have her medications with her. Decided that it would be most productive to schedule a face to face appointment here in clinic. She is to bring all of her medications with her. I am mailing her a reminder.   Catie [name], PharmD, [place], CPP Clinical Pharmacist [place] [phone]

## 2020-02-08 DIAGNOSIS — Z79891 Long term (current) use of opiate analgesic: Secondary | ICD-10-CM | POA: Diagnosis not present

## 2020-02-08 DIAGNOSIS — M545 Low back pain: Secondary | ICD-10-CM | POA: Diagnosis not present

## 2020-02-08 DIAGNOSIS — G894 Chronic pain syndrome: Secondary | ICD-10-CM | POA: Diagnosis not present

## 2020-02-08 NOTE — Telephone Encounter (Signed)
No answer, no voicemail.   Heather Sweeney has been denied

## 2020-02-09 ENCOUNTER — Ambulatory Visit: Payer: Medicare HMO | Admitting: Internal Medicine

## 2020-02-09 DIAGNOSIS — Z5181 Encounter for therapeutic drug level monitoring: Secondary | ICD-10-CM | POA: Diagnosis not present

## 2020-02-09 DIAGNOSIS — M79606 Pain in leg, unspecified: Secondary | ICD-10-CM | POA: Diagnosis not present

## 2020-02-09 DIAGNOSIS — F4542 Pain disorder with related psychological factors: Secondary | ICD-10-CM | POA: Diagnosis not present

## 2020-02-09 DIAGNOSIS — Z79891 Long term (current) use of opiate analgesic: Secondary | ICD-10-CM | POA: Diagnosis not present

## 2020-02-09 DIAGNOSIS — M7918 Myalgia, other site: Secondary | ICD-10-CM | POA: Diagnosis not present

## 2020-02-09 DIAGNOSIS — M545 Low back pain: Secondary | ICD-10-CM | POA: Diagnosis not present

## 2020-02-09 DIAGNOSIS — G894 Chronic pain syndrome: Secondary | ICD-10-CM | POA: Diagnosis not present

## 2020-02-09 DIAGNOSIS — M47816 Spondylosis without myelopathy or radiculopathy, lumbar region: Secondary | ICD-10-CM | POA: Diagnosis not present

## 2020-02-09 DIAGNOSIS — M797 Fibromyalgia: Secondary | ICD-10-CM | POA: Diagnosis not present

## 2020-02-09 NOTE — Telephone Encounter (Signed)
Patient informed and verbalized understanding

## 2020-02-13 ENCOUNTER — Ambulatory Visit (INDEPENDENT_AMBULATORY_CARE_PROVIDER_SITE_OTHER): Payer: Medicare HMO | Admitting: Pharmacist

## 2020-02-13 DIAGNOSIS — Z6841 Body Mass Index (BMI) 40.0 and over, adult: Secondary | ICD-10-CM | POA: Diagnosis not present

## 2020-02-13 DIAGNOSIS — F411 Generalized anxiety disorder: Secondary | ICD-10-CM | POA: Diagnosis not present

## 2020-02-13 DIAGNOSIS — Z87898 Personal history of other specified conditions: Secondary | ICD-10-CM

## 2020-02-13 DIAGNOSIS — F250 Schizoaffective disorder, bipolar type: Secondary | ICD-10-CM | POA: Diagnosis not present

## 2020-02-13 DIAGNOSIS — I10 Essential (primary) hypertension: Secondary | ICD-10-CM

## 2020-02-13 DIAGNOSIS — F39 Unspecified mood [affective] disorder: Secondary | ICD-10-CM

## 2020-02-13 NOTE — Chronic Care Management (AMB) (Signed)
Chronic Care Management   Note  02/13/2020 Name: JALENE DEMO MRN: [id number] DOB: August 11, 1966   Subjective:  [name] is a 54 y.o. year old female who is a primary care patient of McLean-Scocuzza, Nino Glow, MD. The CCM team was consulted for assistance with chronic disease management and care coordination needs.    Contacted patient for medication management review. Was meant to be face to face visit, but she forgot.   Review of patient status, including review of consultants reports, laboratory and other test data, was performed as part of comprehensive evaluation and provision of chronic care management services.   SDOH (Social Determinants of Health) assessments and interventions performed: yes   Objective:  Lab Results  Component Value Date   CREATININE 0.98 11/21/2019   CREATININE 1.01 05/19/2019   CREATININE 1.03 02/24/2019    Lab Results  Component Value Date   HGBA1C 5.3 05/19/2019       Component Value Date/Time   CHOL 146 11/21/2019 1112   TRIG 63.0 11/21/2019 1112   HDL 53.30 11/21/2019 1112   CHOLHDL 3 11/21/2019 1112   VLDL 12.6 11/21/2019 1112   [place] 80 11/21/2019 1112   LDLCALC 84 05/19/2019 1511    Clinical ASCVD: No  The 10-year ASCVD risk score Mikey Bussing DC Jr., et al., 2013) is: 4.9%   Values used to calculate the score:     Age: 59 years     Sex: Female     Is Non-Hispanic African American: Yes     Diabetic: Yes     Tobacco smoker: No     Systolic Blood Pressure: 073 mmHg     Is BP treated: Yes     HDL Cholesterol: 53.3 mg/dL     Total Cholesterol: 146 mg/dL    BP Readings from Last 3 Encounters:  01/10/20 118/76  12/27/19 128/76  11/29/19 124/82    Allergies  Allergen Reactions  . Cephalexin Swelling and Rash  . Hctz [Hydrochlorothiazide] Rash and Swelling    Mild she says  . Lisinopril Hives and Other (See Comments)    Headache, Low BP, angioedema    . Losartan Hives and Swelling    Angioedema  . Penicillins Rash   Has patient had a PCN reaction causing immediate rash, facial/tongue/throat swelling, SOB or lightheadedness with hypotension: Yes Has patient had a PCN reaction causing severe rash involving mucus membranes or skin necrosis: No Has patient had a PCN reaction that required hospitalization: No Has patient had a PCN reaction occurring within the last 10 years: No If all of the above answers are "NO", then may proceed with Cephalosporin use.   . Bee Venom     Swelling   . Metformin And Related     Rash     Medications Reviewed Today    Reviewed by Gardiner Barefoot, DPM (Physician) on 01/19/20 at 33  Med List Status: <None>  Medication Order Taking? Sig Documenting Provider Last Dose Status Informant  Acidophilus Lactobacillus CAPS [id number] Yes Take 1 capsule by mouth daily. McLean-Scocuzza, Nino Glow, MD Taking Active   Alcohol Swabs (B-D SINGLE USE SWABS REGULAR) PADS [id number] Yes  [provider] Taking Active   amLODipine (NORVASC) 5 MG tablet [id number] Yes Take 1 tablet (5 mg total) by mouth daily. McLean-Scocuzza, Nino Glow, MD Taking Active   APPLE CIDER VINEGAR PO [id number] Yes Take by mouth. [provider] Taking Active   B Complex-C (B-COMPLEX WITH VITAMIN C) tablet [id number] Yes Take 1 tablet by mouth  daily. [provider] Taking Active   benztropine (COGENTIN) 1 MG tablet [id number] Yes Take 1 mg by mouth daily as needed (anxiety).  [provider] Taking Active Self  Blood Glucose Calibration (TRUE METRIX LEVEL 1) Low SOLN [id number] Yes  [provider] Taking Active   cetirizine (ZYRTEC) 10 MG tablet [id number] Yes Take 1 tablet (10 mg total) by mouth daily as needed for allergies. McLean-Scocuzza, Nino Glow, MD Taking Active   Ciclopirox 8 % KIT [id number] Yes Apply 1 application topically daily. Next to skin on toenails daily and remove every 7 days with alcohol McLean-Scocuzza, Nino Glow, MD Taking Active   EPINEPHrine 0.3 mg/0.3 mL IJ SOAJ  injection [id number] Yes Inject 0.3 mLs (0.3 mg total) into the muscle as needed for anaphylaxis. Merlyn Lot, MD Taking Active   etodolac (LODINE) 500 MG tablet [id number] Yes Take by mouth. [provider] Taking Active   FLUoxetine (PROZAC) 20 MG capsule [id number] Yes Take 20 mg by mouth daily. [provider] Taking Active Self  fluPHENAZine (PROLIXIN) 2.5 MG tablet [id number] Yes Take 2.5 mg by mouth at bedtime.  [provider] Taking Active Self  fluticasone (FLONASE) 50 MCG/ACT nasal spray [id number] Yes Place 2 sprays into both nostrils daily. McLean-Scocuzza, Nino Glow, MD Taking Active   gabapentin (NEURONTIN) 300 MG capsule [id number] Yes Take 1 capsule (300 mg total) by mouth 3 (three) times daily. McLean-Scocuzza, Nino Glow, MD Taking Active   hydrOXYzine (ATARAX/VISTARIL) 25 MG tablet [id number] Yes Take 1 tablet (25 mg total) by mouth every 8 (eight) hours as needed for itching. Or itchy rash do not take with zyrtec at the same time McLean-Scocuzza, Nino Glow, MD Taking Active   hydrOXYzine (VISTARIL) 25 MG capsule [id number] Yes  [provider] Taking Active   Insulin Pen Needle (PEN NEEDLES) 30G X 8 MM MISC [id number] Yes 1 Device by Does not apply route daily. McLean-Scocuzza, Nino Glow, MD Taking Active   Liraglutide -Weight Management (SAXENDA) 18 MG/3ML SOPN [id number] Yes Inject 0.1 mLs (0.6 mg total) into the skin daily, THEN 0.1 mLs (0.6 mg total) daily. X 1 week, then 1.2 daily x week 2, then 1.8 daily x week 3, then 2.4 daily x week 4 then 3.0 daily x week 5. McLean-Scocuzza, Nino Glow, MD Taking Active   meloxicam [facility]) 15 MG tablet [id number] Yes  [provider] Taking Active   metFORMIN (GLUCOPHAGE-XR) 500 MG 24 hr tablet [id number] Yes  [provider] Taking Active   metoprolol succinate (TOPROL-XL) 25 MG 24 hr tablet [id number] Yes Take 1 tablet (25 mg total) by mouth daily. Take with or immediately following a meal.  McLean-Scocuzza, Nino Glow, MD Taking Active   metroNIDAZOLE (FLAGYL) 500 MG tablet [id number] Yes Take 1 tablet (500 mg total) by mouth 2 (two) times daily. With food McLean-Scocuzza, Nino Glow, MD Taking Active   nystatin cream (MYCOSTATIN) [id number] Yes Apply 1 application topically 2 (two) times daily. Malachy Mood, MD Taking Active   oxyCODONE-acetaminophen [facility]) 10-325 MG tablet [id number] Yes Take 1 tablet by mouth every 6 (six) hours as needed for pain.  Patient taking differently: Take 1 tablet by mouth every 8 (eight) hours as needed for pain (Dr. Mirna Mires pain).    Johnathan Hausen, MD Taking Active   pantoprazole (PROTONIX) 40 MG tablet [id number] Yes Take 1 tablet (40 mg total) by mouth daily. McLean-Scocuzza, Nino Glow, MD Taking Active   phentermine (ADIPEX-P) 37.5 MG tablet [id number] Yes Take 1 tablet (37.5 mg total)  by mouth daily before breakfast. McLean-Scocuzza, Nino Glow, MD Taking Active   polyethylene glycol [facility] / GLYCOLAX) packet [id number] Yes Take 17 g by mouth daily as needed for mild constipation. [provider] Taking Active Self  pravastatin (PRAVACHOL) 10 MG tablet [id number] Yes  [provider] Taking Active   spironolactone (ALDACTONE) 25 MG tablet [id number] Yes Take 0.5 tablets (12.5 mg total) by mouth daily. McLean-Scocuzza, Nino Glow, MD Taking Active   terbinafine (LAMISIL) 250 MG tablet [id number] Yes Take 1 tablet (250 mg total) by mouth daily. Dc after 3 months McLean-Scocuzza, Nino Glow, MD Taking Active            Assessment:   Goals Addressed            This Visit's Progress     Patient Stated   . PharmD "I can't keep up with these medications" (pt-stated)       CARE PLAN ENTRY (see longtitudinal plan of care for additional care plan information)  Current Barriers:  . Polypharmacy; complex patient with multiple comorbidities including obesity, hx prediabetes, HTN, mood disorder, chronic pain . Self-manages medications, but  does not use a pill box. Noted today that she did not have 2 of her medications.  . Denies cost concerns with medications, except for Saxenda. Notes that she gets some medications from [employer], some from [employer] . Most recent eGFR: ~72 mL/min o Obesity, hx prediabtes: prescribed phentermine, but discussed changing to [place] for preferred safety profile. Unfortunately, Kirke Shaggy is not on formulary on patient's insurance plan. PA denied. Previous use of Victoza with significant benefit  o Mood disorder - previous psychiatrist not covered by [facility] plan, now being seen at [facility] - fluoxetine 20 mg daily, fluphenazine 2.5 mg daily, benztropine 1 mg daily (patient does not have, last filled 01/10/20 for 30 days supply at [facility]); hydroxyzine 25 mg PRN  o Chronic pain: follows w/ orthopedics, as well as new pain management provider in [place]: etodolac 500 mg BID, gabapentin 300 mg TID, oxycodone/APAP 10/325 mg Q6H PRN o HTN: metoprolol succinate 25 mg daily, amlodipine 5 mg daily, spironolactone 12.5 mg daily (patient does NOT have, last filled at [facility] mail order pharmacy) o HLD: pravastatin 10 mg QPM o Allergies: cetirizine 10 mg QAM o GERD: pantoprazole 40 mg daily,  o Nail infection: terbinafine 250 mg daily x 3 months, reports she has been taking for ~ 2 months now  Pharmacist Clinical Goal(s):  Marland Kitchen Over the next 90 days, patient will work with PharmD and provider towards optimized medication management  Interventions: . Comprehensive medication review performed; medication list updated in electronic medical record . Inter-disciplinary care team collaboration (see longitudinal plan of care) . Reviewed medications. Mailing patient a BID pill box as well as a list of how to fill the pill box.  . Discussed Saxenda. Not covered by Medicare. Could consider off-label prescribing of Victoza (should still be covered, even though patient is not diabetic) vs referral to [facility]. Agree that GLP1 safer option long term than phentermine, given HTN. Will collaborate w/ PCP for next steps.  Patient Self Care Activities:  . Patient will take medications as prescribed and will focus on improved adherence through use of pill box  Initial goal documentation        Plan: - Scheduled f/u call in ~ 6 weeks  Catie [name], PharmD, [place], CPP Clinical Pharmacist [place] [phone]

## 2020-02-13 NOTE — Patient Instructions (Addendum)
Ms. Conchas,   This is how I would fill your bill box:   Morning: - Amlodipine 5 mg (blood pressure) - Etodolac 500 mg (inflammation) - Fluoxetine 20 mg daily (mood) - Gabapentin 300 mg (pain) - Spironolactone 12.5 mg (1/2 tablet, blood pressure) - Pantoprazole 40 mg (acid reflux, take ~30 minutes before a meal) - Terbinafine 250 mg daily - finish after 3 months   Evening: - Fluphenazine 2.5 mg (mood) - Pravastatin 10 mg (cholesterol) - Gabapentin 300 mg (pain) - Metoprolol succinate 25 mg (blood pressure) - Benztropine 1 mg daily   As needed medications: - Midday dose of gabapentin 300 mg - pain - Oxycodone/acetaminophen 5/325 mg - pain - Cetirizine 10 mg - allergies  - Hydroxyzine 25 mg - anxiety    Call me if you have any questions or concerns! I have scheduled a follow up call on Thursday, June 24 at 3 pm.   Thanks!  Visit Information  Goals Addressed            This Visit's Progress     Patient Stated   . PharmD "I can't keep up with these medications" (pt-stated)       CARE PLAN ENTRY (see longtitudinal plan of care for additional care plan information)  Current Barriers:  . Polypharmacy; complex patient with multiple comorbidities including obesity, hx prediabetes, HTN, mood disorder, chronic pain . Self-manages medications, but does not use a pill box. Noted today that she did not have 2 of her medications.  . Denies cost concerns with medications, except for Saxenda. Notes that she gets some medications from [employer], some from [employer] . Most recent eGFR: ~72 mL/min o Obesity, hx prediabtes: prescribed phentermine, but discussed changing to [place] for preferred safety profile. Unfortunately, Kirke Shaggy is not on formulary on patient's insurance plan. PA denied. Previous use of Victoza with significant benefit  o Mood disorder - previous psychiatrist not covered by [facility] plan, now being seen at [facility] - fluoxetine 20 mg  daily, fluphenazine 2.5 mg daily, benztropine 1 mg daily (patient does not have, last filled 01/10/20 for 30 days supply at [facility]); hydroxyzine 25 mg PRN  o Chronic pain: follows w/ orthopedics, as well as new pain management provider in [place]: etodolac 500 mg BID, gabapentin 300 mg TID, oxycodone/APAP 10/325 mg Q6H PRN o HTN: metoprolol succinate 25 mg daily, amlodipine 5 mg daily, spironolactone 12.5 mg daily (patient does NOT have, last filled at [facility] mail order pharmacy) o HLD: pravastatin 10 mg QPM o Allergies: cetirizine 10 mg QAM o GERD: pantoprazole 40 mg daily,  o Nail infection: terbinafine 250 mg daily x 3 months, reports she has been taking for ~ 2 months now  Pharmacist Clinical Goal(s):  Marland Kitchen Over the next 90 days, patient will work with PharmD and provider towards optimized medication management  Interventions: . Comprehensive medication review performed; medication list updated in electronic medical record . Inter-disciplinary care team collaboration (see longitudinal plan of care) . Reviewed medications. Mailing patient a BID pill box as well as a list of how to fill the pill box.  . Discussed Saxenda. Not covered by Medicare. Could consider off-label prescribing of Victoza (should still be covered, even though patient is not diabetic) vs referral to [facility]. Agree that GLP1 safer option long term than phentermine, given HTN. Will collaborate w/ PCP for next steps.  Patient Self Care Activities:  . Patient will take medications as prescribed and will focus on improved  adherence through use of pill box  Initial goal documentation        Patient verbalizes understanding of instructions provided today.   Plan: - Scheduled f/u call in ~ 6 weeks  Catie [name], PharmD, [place], [place] Pharmacist [place] [phone]

## 2020-02-15 DIAGNOSIS — G8929 Other chronic pain: Secondary | ICD-10-CM | POA: Diagnosis not present

## 2020-02-15 DIAGNOSIS — Z6841 Body Mass Index (BMI) 40.0 and over, adult: Secondary | ICD-10-CM | POA: Diagnosis not present

## 2020-02-15 DIAGNOSIS — M2391 Unspecified internal derangement of right knee: Secondary | ICD-10-CM | POA: Diagnosis not present

## 2020-02-15 DIAGNOSIS — M25361 Other instability, right knee: Secondary | ICD-10-CM | POA: Diagnosis not present

## 2020-02-15 DIAGNOSIS — M25561 Pain in right knee: Secondary | ICD-10-CM | POA: Diagnosis not present

## 2020-02-15 DIAGNOSIS — M1711 Unilateral primary osteoarthritis, right knee: Secondary | ICD-10-CM | POA: Diagnosis not present

## 2020-02-16 ENCOUNTER — Other Ambulatory Visit: Payer: Self-pay | Admitting: Orthopedic Surgery

## 2020-02-16 DIAGNOSIS — G8929 Other chronic pain: Secondary | ICD-10-CM

## 2020-02-16 DIAGNOSIS — M1711 Unilateral primary osteoarthritis, right knee: Secondary | ICD-10-CM

## 2020-02-16 DIAGNOSIS — M25361 Other instability, right knee: Secondary | ICD-10-CM

## 2020-02-16 DIAGNOSIS — M2391 Unspecified internal derangement of right knee: Secondary | ICD-10-CM

## 2020-02-20 DIAGNOSIS — Z5181 Encounter for therapeutic drug level monitoring: Secondary | ICD-10-CM | POA: Diagnosis not present

## 2020-02-20 DIAGNOSIS — G894 Chronic pain syndrome: Secondary | ICD-10-CM | POA: Diagnosis not present

## 2020-02-20 DIAGNOSIS — M79606 Pain in leg, unspecified: Secondary | ICD-10-CM | POA: Diagnosis not present

## 2020-02-20 DIAGNOSIS — M47816 Spondylosis without myelopathy or radiculopathy, lumbar region: Secondary | ICD-10-CM | POA: Diagnosis not present

## 2020-02-20 DIAGNOSIS — M545 Low back pain: Secondary | ICD-10-CM | POA: Diagnosis not present

## 2020-02-20 DIAGNOSIS — F4542 Pain disorder with related psychological factors: Secondary | ICD-10-CM | POA: Diagnosis not present

## 2020-02-20 DIAGNOSIS — M797 Fibromyalgia: Secondary | ICD-10-CM | POA: Diagnosis not present

## 2020-02-20 DIAGNOSIS — Z79891 Long term (current) use of opiate analgesic: Secondary | ICD-10-CM | POA: Diagnosis not present

## 2020-02-27 ENCOUNTER — Ambulatory Visit: Payer: Medicare HMO | Admitting: Obstetrics and Gynecology

## 2020-02-27 DIAGNOSIS — M47816 Spondylosis without myelopathy or radiculopathy, lumbar region: Secondary | ICD-10-CM | POA: Diagnosis not present

## 2020-03-01 ENCOUNTER — Ambulatory Visit (INDEPENDENT_AMBULATORY_CARE_PROVIDER_SITE_OTHER): Payer: Medicare HMO | Admitting: Obstetrics and Gynecology

## 2020-03-01 ENCOUNTER — Other Ambulatory Visit: Payer: Self-pay

## 2020-03-01 ENCOUNTER — Ambulatory Visit
Admission: RE | Admit: 2020-03-01 | Discharge: 2020-03-01 | Disposition: A | Discharge status: Home / Self Care | Payer: Medicare HMO | Source: Ambulatory Visit | Attending: Orthopedic Surgery | Admitting: Orthopedic Surgery

## 2020-03-01 ENCOUNTER — Encounter: Payer: Self-pay | Admitting: Obstetrics and Gynecology

## 2020-03-01 VITALS — BP 144/81 | HR 86 | Wt 255.0 lb

## 2020-03-01 DIAGNOSIS — M1711 Unilateral primary osteoarthritis, right knee: Secondary | ICD-10-CM | POA: Diagnosis not present

## 2020-03-01 DIAGNOSIS — N760 Acute vaginitis: Secondary | ICD-10-CM | POA: Diagnosis not present

## 2020-03-01 DIAGNOSIS — G8929 Other chronic pain: Secondary | ICD-10-CM | POA: Diagnosis not present

## 2020-03-01 DIAGNOSIS — M2391 Unspecified internal derangement of right knee: Secondary | ICD-10-CM | POA: Diagnosis not present

## 2020-03-01 DIAGNOSIS — M25361 Other instability, right knee: Secondary | ICD-10-CM | POA: Insufficient documentation

## 2020-03-01 DIAGNOSIS — M25561 Pain in right knee: Secondary | ICD-10-CM | POA: Diagnosis not present

## 2020-03-01 MED ORDER — TRIAMCINOLONE ACETONIDE 0.1 % EX CREA: 1.0000 "application " | TOPICAL_CREAM | Freq: Two times a day (BID) | CUTANEOUS | 1 refills | Status: DC

## 2020-03-01 MED ORDER — NYSTATIN 100000 UNIT/GM EX CREA: 1.0000 "application " | TOPICAL_CREAM | Freq: Two times a day (BID) | CUTANEOUS | 1 refills | Status: DC

## 2020-03-01 NOTE — Progress Notes (Signed)
Obstetrics & Gynecology Office Visit   Chief Complaint:  Chief Complaint  Patient presents with  . Follow-up    History of Present Illness: 54 y.o. [id number] presenting for medication follow up for a diagnosis of recurrent vaginal candida.  She is currently being managed with nystatin cream.   The patient reports improvement in symptoms but continued itching and irritation.  On her current medication regimen has not had menstrual complaints secondary to history of prior hysterectomy.   She has not noted any side-effects or new symptoms.    Review of Systems: Review of Systems  Constitutional: Negative.   Genitourinary: Negative.   Skin: Positive for itching. Negative for rash.     Past Medical History:  Past Medical History:  Diagnosis Date  . Allergic rhinitis   . Bipolar disorder ([place])   . Chronic low back pain   . CTS (carpal tunnel syndrome)    hands  . Depression   . Gallstones   . GERD (gastroesophageal reflux disease)   . History of prediabetes   . Hypertension   . Laceration of right foot   . Onychomycosis   . OSA on CPAP   . Overactive bladder   . PVC's (premature ventricular contractions)   . Seizures ([place])    8th grade    Past Surgical History:  Past Surgical History:  Procedure Laterality Date  . ABDOMINAL HYSTERECTOMY  07/08/2011  . ABDOMINAL HYSTERECTOMY     supracervical in 07/2011   . CARPAL TUNNEL RELEASE     right hand 1996  . CESAREAN SECTION     2012  . CHOLECYSTECTOMY N/A 01/12/2018   Procedure: LAPAROSCOPIC CHOLECYSTECTOMY WITH INTRAOPERATIVE CHOLANGIOGRAM ERAS PATHWAY;  Surgeon: Johnathan Hausen, MD;  Location: [facility] ORS;  Service: General;  Laterality: N/A;  . COLONOSCOPY WITH PROPOFOL N/A 04/15/2019   Procedure: COLONOSCOPY WITH PROPOFOL;  Surgeon: Jonathon Bellows, MD;  Location: [facility] ENDOSCOPY;  Service: Gastroenterology;  Laterality: N/A;  . DILATION AND CURETTAGE OF UTERUS     04/2011 benign endometrial polyp squamous metaplasia, inflamm,  blood, mucous, benign   . FOOT SURGERY     Dr. Prudence Davidson   . LEEP     2006  . TUBAL LIGATION      Gynecologic History: No LMP recorded. Patient has had a hysterectomy.  Obstetric History: [id number]  Family History:  Family History  Problem Relation Age of Onset  . Cancer Father        esophageal   . Hypertension Father   . Heart failure Mother   . Hypertension Mother   . Early death Mother   . Heart disease Mother        chf  . Breast cancer Sister 64  . Cancer Sister        breast  . Depression Sister   . Mental illness Sister   . Asthma Son   . Cancer Other        brain cancer    Social History:  Social History   Socioeconomic History  . Marital status: Married    Spouse name: Not on file  . Number of children: Not on file  . Years of education: Not on file  . Highest education level: Not on file  Occupational History  . Not on file  Tobacco Use  . Smoking status: Never Smoker  . Smokeless tobacco: Current User    Types: Snuff  . Tobacco comment: Is in process of decreasing amount  Substance and Sexual Activity  .  Alcohol use: No    Alcohol/week: 0.0 standard drinks  . Drug use: No  . Sexual activity: Yes    Comment: men  Other Topics Concern  . Not on file  Social History Narrative   Married    No guns    Wears seat belt    Safe in relationship    Never smoker    2 sons Gaspar Bidding ~10 and another son ~28 as of 10/2019   1 granson eli 1 y.o as of 10/2019   2 sisters    Unemployed       Social Determinants of Health   Financial Resource Strain: Low Risk   . Difficulty of Paying Living Expenses: Not hard at all  Food Insecurity: No Food Insecurity  . Worried About Charity fundraiser in the Last Year: Never true  . Ran Out of Food in the Last Year: Never true  Transportation Needs: No Transportation Needs  . Lack of Transportation (Medical): No  . Lack of Transportation (Non-Medical): No  Physical Activity: Insufficiently Active  . Days of Exercise  per Week: 3 days  . Minutes of Exercise per Session: 30 min  Stress: No Stress Concern Present  . Feeling of Stress : Not at all  Social Connections:   . Frequency of Communication with Friends and Family:   . Frequency of Social Gatherings with Friends and Family:   . Attends Religious Services:   . Active Member of Clubs or Organizations:   . Attends Archivist Meetings:   Marland Kitchen Marital Status:   Intimate Partner Violence: Not At Risk  . Fear of Current or Ex-Partner: No  . Emotionally Abused: No  . Physically Abused: No  . Sexually Abused: No    Allergies:  Allergies  Allergen Reactions  . Cephalexin Swelling and Rash  . Hctz [Hydrochlorothiazide] Rash and Swelling    Mild she says  . Lisinopril Hives and Other (See Comments)    Headache, Low BP, angioedema    . Losartan Hives and Swelling    Angioedema  . Penicillins Rash    Has patient had a PCN reaction causing immediate rash, facial/tongue/throat swelling, SOB or lightheadedness with hypotension: Yes Has patient had a PCN reaction causing severe rash involving mucus membranes or skin necrosis: No Has patient had a PCN reaction that required hospitalization: No Has patient had a PCN reaction occurring within the last 10 years: No If all of the above answers are "NO", then may proceed with Cephalosporin use.   . Bee Venom     Swelling   . Metformin And Related     Rash     Medications: Prior to Admission medications   Medication Sig Start Date End Date Taking? Authorizing Provider  amLODipine (NORVASC) 5 MG tablet Take 1 tablet (5 mg total) by mouth daily. 08/30/19 08/29/20  McLean-Scocuzza, Nino Glow, MD  APPLE CIDER VINEGAR PO Take by mouth.    [provider]  benztropine (COGENTIN) 1 MG tablet Take 1 mg by mouth daily as needed (anxiety).     [provider]  Blood Glucose Calibration (TRUE METRIX LEVEL 1) Low SOLN  01/12/20   [provider]  cetirizine (ZYRTEC) 10 MG tablet  Take 1 tablet (10 mg total) by mouth daily as needed for allergies. 11/24/19   McLean-Scocuzza, Nino Glow, MD  EPINEPHrine 0.3 mg/0.3 mL IJ SOAJ injection Inject 0.3 mLs (0.3 mg total) into the muscle as needed for anaphylaxis. 02/19/19   Merlyn Lot, MD  FLUoxetine (PROZAC) 20 MG capsule Take 20 mg by mouth daily.    [provider]  fluPHENAZine (PROLIXIN) 2.5 MG tablet Take 2.5 mg by mouth at bedtime.     [provider]  fluticasone (FLONASE) 50 MCG/ACT nasal spray Place 2 sprays into both nostrils daily. 08/30/19   McLean-Scocuzza, Nino Glow, MD  gabapentin (NEURONTIN) 300 MG capsule Take 1 capsule (300 mg total) by mouth 3 (three) times daily. 08/30/19   McLean-Scocuzza, Nino Glow, MD  hydrOXYzine (ATARAX/VISTARIL) 25 MG tablet Take 1 tablet (25 mg total) by mouth every 8 (eight) hours as needed for itching. Or itchy rash do not take with zyrtec at the same time 08/30/19   McLean-Scocuzza, Nino Glow, MD  Insulin Pen Needle (PEN NEEDLES) 30G X 8 MM MISC 1 Device by Does not apply route daily. Patient not taking: Reported on 02/13/2020 01/10/20   McLean-Scocuzza, Nino Glow, MD  Liraglutide -Weight Management (SAXENDA) 18 MG/3ML SOPN Inject 0.1 mLs (0.6 mg total) into the skin daily, THEN 0.1 mLs (0.6 mg total) daily. X 1 week, then 1.2 daily x week 2, then 1.8 daily x week 3, then 2.4 daily x week 4 then 3.0 daily x week 5. Patient not taking: Reported on 02/13/2020 01/10/20 01/09/22  McLean-Scocuzza, Nino Glow, MD  metoprolol succinate (TOPROL-XL) 25 MG 24 hr tablet Take 1 tablet (25 mg total) by mouth daily. Take with or immediately following a meal. 08/30/19   McLean-Scocuzza, Nino Glow, MD  oxyCODONE-acetaminophen (PERCOCET) 10-325 MG tablet Take 1 tablet by mouth every 6 (six) hours as needed for pain. Patient taking differently: Take 1 tablet by mouth every 8 (eight) hours as needed for pain (Dr. Mirna Mires pain).  01/12/18   Johnathan Hausen, MD  pantoprazole (PROTONIX) 40 MG tablet Take 1 tablet  (40 mg total) by mouth daily. 08/30/19   McLean-Scocuzza, Nino Glow, MD  phentermine (ADIPEX-P) 37.5 MG tablet Take 1 tablet (37.5 mg total) by mouth daily before breakfast. Patient not taking: Reported on 02/13/2020 12/27/19   McLean-Scocuzza, Nino Glow, MD  polyethylene glycol (MIRALAX / GLYCOLAX) packet Take 17 g by mouth daily as needed for mild constipation.    [provider]  pravastatin (PRAVACHOL) 10 MG tablet  12/09/19   [provider]  spironolactone (ALDACTONE) 25 MG tablet Take 0.5 tablets (12.5 mg total) by mouth daily. 08/30/19   McLean-Scocuzza, Nino Glow, MD  terbinafine (LAMISIL) 250 MG tablet Take 1 tablet (250 mg total) by mouth daily. Dc after 3 months 08/30/19   McLean-Scocuzza, Nino Glow, MD    Physical Exam Vitals:  Vitals:   03/01/20 1058  BP: (!) 144/81  Pulse: 86   No LMP recorded. Patient has had a hysterectomy.  General: NAD HEENT: normocephalic, anicteric Pulmonary: No increased work of breathing Genitourinary:  External: Normal external female genitalia.  Normal urethral meatus, normal Bartholin's and Skene's glands.  Previous areas of erythema have resolved  Vagina: Normal vaginal mucosa, no evidence of prolapse.    Rectal: deferred Extremities: no edema, erythema, or tenderness Neurologic: Grossly intact Psychiatric: mood appropriate, affect full  Female chaperone present for pelvic  portions of the physical exam  Assessment: 54 y.o. [id number] follow up chronic vaginitis  Plan: Problem List Items Addressed This Visit    None    Visit Diagnoses    Recurrent vaginitis    -  Primary     - resolution of erythema previously noted.  Still pruritus.  Given no skin changes suspect potential lichen simplex chronicus  will try to break scratch itch cycle with topical steroid    - A total of 15 minutes were spent in face-to-face contact with the patient during this encounter with over half of that time devoted to counseling and coordination of  care.  - Return in about 6 weeks (around 04/12/2020) for 6-8 week medication follow up.   Malachy Mood, MD, [facility], [facility] 03/01/2020, 11:19 AM

## 2020-03-06 DIAGNOSIS — E785 Hyperlipidemia, unspecified: Secondary | ICD-10-CM | POA: Diagnosis not present

## 2020-03-06 DIAGNOSIS — E119 Type 2 diabetes mellitus without complications: Secondary | ICD-10-CM | POA: Diagnosis not present

## 2020-03-06 DIAGNOSIS — K219 Gastro-esophageal reflux disease without esophagitis: Secondary | ICD-10-CM | POA: Diagnosis not present

## 2020-03-06 DIAGNOSIS — I25118 Atherosclerotic heart disease of native coronary artery with other forms of angina pectoris: Secondary | ICD-10-CM | POA: Diagnosis not present

## 2020-03-08 DIAGNOSIS — M545 Low back pain: Secondary | ICD-10-CM | POA: Diagnosis not present

## 2020-03-08 DIAGNOSIS — G894 Chronic pain syndrome: Secondary | ICD-10-CM | POA: Diagnosis not present

## 2020-03-08 DIAGNOSIS — Z79891 Long term (current) use of opiate analgesic: Secondary | ICD-10-CM | POA: Diagnosis not present

## 2020-03-08 DIAGNOSIS — M79606 Pain in leg, unspecified: Secondary | ICD-10-CM | POA: Diagnosis not present

## 2020-03-08 DIAGNOSIS — F4542 Pain disorder with related psychological factors: Secondary | ICD-10-CM | POA: Diagnosis not present

## 2020-03-08 DIAGNOSIS — M797 Fibromyalgia: Secondary | ICD-10-CM | POA: Diagnosis not present

## 2020-03-08 DIAGNOSIS — Z5181 Encounter for therapeutic drug level monitoring: Secondary | ICD-10-CM | POA: Diagnosis not present

## 2020-03-08 DIAGNOSIS — M47816 Spondylosis without myelopathy or radiculopathy, lumbar region: Secondary | ICD-10-CM | POA: Diagnosis not present

## 2020-03-12 ENCOUNTER — Other Ambulatory Visit: Payer: Self-pay | Admitting: Internal Medicine

## 2020-03-12 DIAGNOSIS — F39 Unspecified mood [affective] disorder: Secondary | ICD-10-CM

## 2020-03-12 DIAGNOSIS — G4733 Obstructive sleep apnea (adult) (pediatric): Secondary | ICD-10-CM

## 2020-03-12 DIAGNOSIS — I10 Essential (primary) hypertension: Secondary | ICD-10-CM

## 2020-03-12 DIAGNOSIS — E785 Hyperlipidemia, unspecified: Secondary | ICD-10-CM

## 2020-03-12 DIAGNOSIS — Z87898 Personal history of other specified conditions: Secondary | ICD-10-CM

## 2020-03-12 DIAGNOSIS — F250 Schizoaffective disorder, bipolar type: Secondary | ICD-10-CM | POA: Diagnosis not present

## 2020-03-12 DIAGNOSIS — F411 Generalized anxiety disorder: Secondary | ICD-10-CM | POA: Diagnosis not present

## 2020-03-12 MED ORDER — PEN NEEDLES 30G X 8 MM MISC: 1.0000 | Freq: Every day | 3 refills | Status: DC

## 2020-03-12 MED ORDER — LIRAGLUTIDE 18 MG/3ML ~~LOC~~ SOPN: 0.6000 mg | PEN_INJECTOR | Freq: Every day | SUBCUTANEOUS | 11 refills | Status: DC

## 2020-03-12 NOTE — Progress Notes (Signed)
Rx victoza   Dr. Kelly Services

## 2020-03-14 ENCOUNTER — Other Ambulatory Visit: Payer: Self-pay | Admitting: Internal Medicine

## 2020-03-14 DIAGNOSIS — L509 Urticaria, unspecified: Secondary | ICD-10-CM

## 2020-03-14 MED ORDER — HYDROXYZINE HCL 25 MG PO TABS: 25.0000 mg | ORAL_TABLET | Freq: Three times a day (TID) | ORAL | 3 refills | Status: DC | PRN

## 2020-03-16 ENCOUNTER — Telehealth: Payer: Self-pay | Admitting: Internal Medicine

## 2020-03-16 NOTE — Telephone Encounter (Signed)
Faxed prescription to [facility] at [phone]

## 2020-03-16 NOTE — Telephone Encounter (Signed)
Faxed prescription to [facility] Benztropine 1 mg tab,fluoxetine 10mg  cap,fluphenazine 2.5mg  tab 03-16-20

## 2020-03-20 ENCOUNTER — Telehealth: Payer: Self-pay | Admitting: Internal Medicine

## 2020-03-20 ENCOUNTER — Other Ambulatory Visit: Payer: Self-pay | Admitting: Internal Medicine

## 2020-03-20 DIAGNOSIS — M170 Bilateral primary osteoarthritis of knee: Secondary | ICD-10-CM | POA: Diagnosis not present

## 2020-03-20 DIAGNOSIS — Z6841 Body Mass Index (BMI) 40.0 and over, adult: Secondary | ICD-10-CM | POA: Diagnosis not present

## 2020-03-20 NOTE — Telephone Encounter (Signed)
Faxed new Rx request for hydroxyzine pamoate 25 mg capsule, metronidazole 0.75% vaginal Gel,metronidazole 500 mg tablet/03-20-20

## 2020-03-20 NOTE — Telephone Encounter (Signed)
Faxed prescription for narcan 4 mg/actuation nasal spray, Triamcinolone acetonide 0.1 topical cream/ 6-15

## 2020-03-20 NOTE — Telephone Encounter (Signed)
Received fax requesting refills. These medications were not under your name. Are you willing to fill these?   Pended for your approval or denial.

## 2020-03-29 ENCOUNTER — Ambulatory Visit (INDEPENDENT_AMBULATORY_CARE_PROVIDER_SITE_OTHER): Payer: Medicare HMO | Admitting: Pharmacist

## 2020-03-29 DIAGNOSIS — Z87898 Personal history of other specified conditions: Secondary | ICD-10-CM

## 2020-03-29 DIAGNOSIS — E785 Hyperlipidemia, unspecified: Secondary | ICD-10-CM

## 2020-03-29 DIAGNOSIS — Z6841 Body Mass Index (BMI) 40.0 and over, adult: Secondary | ICD-10-CM

## 2020-03-29 DIAGNOSIS — I10 Essential (primary) hypertension: Secondary | ICD-10-CM | POA: Diagnosis not present

## 2020-03-29 NOTE — Patient Instructions (Addendum)
Ms. Rondell, Frick ALL OF YOUR MEDICATIONS TO YOUR APPOINTMENT WITH DR. Olivia Mackie. I saw a prescription filled for meloxicam - you probably shouldn't be on both meloxicam and etodolac, these are cousins. I also saw a prescription filled for topiramate, so I have added this to your list.   I'll see you then!  Catie [name], PharmD [phone]  Morning: - Amlodipine 5 mg (blood pressure) - Etodolac 500 mg (inflammation) - Fluoxetine 20 mg daily (mood) - Gabapentin 300 mg (pain) - Spironolactone 12.5 mg (1/2 tablet, blood pressure) - Pantoprazole 40 mg (acid reflux, take ~30 minutes before a meal) - Topiramate 100 mg daily   Evening: - Fluphenazine 2.5 mg (mood) - Pravastatin 10 mg (cholesterol) - Gabapentin 300 mg (pain) - Metoprolol succinate 25 mg (blood pressure) - Benztropine 1 mg daily   As needed medications: - Midday dose of gabapentin 300 mg - pain - Oxycodone/acetaminophen 5/325 mg - pain - Cetirizine 10 mg - allergies  - Hydroxyzine 25 mg - anxiety    Visit Information  Goals Addressed              This Visit's Progress     Patient Stated   .  PharmD "I can't keep up with these medications" (pt-stated)        CARE PLAN ENTRY (see longtitudinal plan of care for additional care plan information)  Current Barriers:  . Polypharmacy; complex patient with multiple comorbidities including obesity, hx prediabetes, HTN, mood disorder, chronic pain, fatty liver disease  o Reports today that she was able to start on Victoza. Up to 1.2 mg daily.  . Self-manages medications. Received the pill box I mailed her, is very Patent attorney. Requests another list of how to fill her pill box. . Denies cost concerns with medications . Most recent eGFR: ~72 mL/min o Obesity, hx prediabtes: Victoza 1.2 mg daily o Mood disorder: fluoxetine 20 mg daily, fluphenazine 2.5 mg daily, benztropine 1 mg daily, hydroxyzine 25 mg PRN; topiramate 100 mg daily on fill history per provider Ander Slade, but patient unable to review meds today as she was not at home o Chronic pain: follows w/ orthopedics, as well as new pain management provider in [place]: etodolac 500 mg BID, though also script for meloxicam 15 mg on fill hx per ortho provider Prescott Parma, but patient unable to review meds with me today, gabapentin 300 mg TID, oxycodone/APAP 10/325 mg Q6H PRN o HTN: metoprolol succinate 25 mg daily, amlodipine 5 mg daily, spironolactone 12.5 mg daily o HLD: pravastatin 10 mg QPM o Allergies: cetirizine 10 mg QAM o GERD: pantoprazole 40 mg daily,  o Nail infection: terbinafine 250 mg daily x 3 months, reports she has been taking for ~ 2 months now  Pharmacist Clinical Goal(s):  Marland Kitchen Over the next 90 days, patient will work with PharmD and provider towards optimized medication management  Interventions: . Comprehensive medication review performed; medication list updated in electronic medical record . Inter-disciplinary care team collaboration (see longitudinal plan of care) . Reiterated that she can increase Victoza to 1.8 mg daily after 1 week on 1.2 mg daily. She verbalized understanding. Notes that she feels "much better", but unsure of specific weight.  . Reviewed to bring all medications to her upcoming appointment w/ PCP for review. Will mail an updated medication list to her.   Patient Self Care Activities:  . Patient will take medications as prescribed and will focus on improved adherence through use of pill box  Please see past  updates related to this goal by clicking on the "Past Updates" button in the selected goal         The patient verbalized understanding of instructions provided today and agreed to receive a mailed copy of patient instruction and/or educational materials.  Plan:  - Scheduled f/u call in ~ 8 weeks  Catie [name], PharmD, [facility], [place] Pharmacist [place] [phone]

## 2020-03-29 NOTE — Chronic Care Management (AMB) (Signed)
Chronic Care Management   Follow Up Note   03/29/2020 Name: NEAL OSHEA MRN: [id number] DOB: [birth year]  Referred by: McLean-Scocuzza, Nino Glow, MD Reason for referral : Chronic Care Management (Medication Management)   SAKEENA Sweeney is a 54 y.o. year old female who is a primary care patient of McLean-Scocuzza, Nino Glow, MD. The CCM team was consulted for assistance with chronic disease management and care coordination needs.    Contacted patient for medication management review.   Review of patient status, including review of consultants reports, relevant laboratory and other test results, and collaboration with appropriate care team members and the patient's provider was performed as part of comprehensive patient evaluation and provision of chronic care management services.    SDOH (Social Determinants of Health) assessments performed: No See Care Plan activities for detailed interventions related to [facility])     Outpatient Encounter Medications as of 03/29/2020  Medication Sig  . Insulin Pen Needle (PEN NEEDLES) 30G X 8 MM MISC 1 Device by Does not apply route daily.  Marland Kitchen liraglutide (VICTOZA) 18 MG/3ML SOPN Inject 0.1 mLs (0.6 mg total) into the skin daily. X 1 week. Increase to 1.2 mg daily x week 2. If able to tolerate increase to 1.8 daily week 3 and beyond  . amLODipine (NORVASC) 5 MG tablet Take 1 tablet (5 mg total) by mouth daily.  . APPLE CIDER VINEGAR PO Take by mouth.  . benztropine (COGENTIN) 1 MG tablet Take 1 mg by mouth daily as needed (anxiety).   . Blood Glucose Calibration (TRUE METRIX LEVEL 1) Low SOLN   . cetirizine (ZYRTEC) 10 MG tablet Take 1 tablet (10 mg total) by mouth daily as needed for allergies.  Marland Kitchen EPINEPHrine 0.3 mg/0.3 mL IJ SOAJ injection Inject 0.3 mLs (0.3 mg total) into the muscle as needed for anaphylaxis.  Marland Kitchen FLUoxetine (PROZAC) 20 MG capsule Take 20 mg by mouth daily.  . fluPHENAZine (PROLIXIN) 2.5 MG tablet Take 2.5 mg by mouth at bedtime.   .  fluticasone (FLONASE) 50 MCG/ACT nasal spray Place 2 sprays into both nostrils daily.  Marland Kitchen gabapentin (NEURONTIN) 300 MG capsule Take 1 capsule (300 mg total) by mouth 3 (three) times daily.  . hydrOXYzine (ATARAX/VISTARIL) 25 MG tablet Take 1 tablet (25 mg total) by mouth every 8 (eight) hours as needed for itching. Or itchy rash do not take with zyrtec at the same time  . metoprolol succinate (TOPROL-XL) 25 MG 24 hr tablet Take 1 tablet (25 mg total) by mouth daily. Take with or immediately following a meal.  . nystatin cream (MYCOSTATIN) Apply 1 application topically 2 (two) times daily.  Marland Kitchen oxyCODONE-acetaminophen (PERCOCET) 10-325 MG tablet Take 1 tablet by mouth every 6 (six) hours as needed for pain. (Patient taking differently: Take 1 tablet by mouth every 8 (eight) hours as needed for pain (Dr. Mirna Mires pain). )  . pantoprazole (PROTONIX) 40 MG tablet Take 1 tablet (40 mg total) by mouth daily.  . polyethylene glycol (MIRALAX / GLYCOLAX) packet Take 17 g by mouth daily as needed for mild constipation.  . pravastatin (PRAVACHOL) 10 MG tablet   . terbinafine (LAMISIL) 250 MG tablet Take 1 tablet (250 mg total) by mouth daily. Dc after 3 months  . topiramate (TOPAMAX) 100 MG tablet Take 100 mg by mouth daily.  Marland Kitchen triamcinolone cream (KENALOG) 0.1 % Apply 1 application topically 2 (two) times daily.   No facility-administered encounter medications on file as of 03/29/2020.     Objective:  Goals Addressed              This Visit's Progress     Patient Stated   .  PharmD "I can't keep up with these medications" (pt-stated)        CARE PLAN ENTRY (see longtitudinal plan of care for additional care plan information)  Current Barriers:  . Polypharmacy; complex patient with multiple comorbidities including obesity, hx prediabetes, HTN, mood disorder, chronic pain, fatty liver disease  o Reports today that she was able to start on Victoza. Up to 1.2 mg daily.  . Self-manages medications.  Received the pill box I mailed her, is very Patent attorney. Requests another list of how to fill her pill box. . Denies cost concerns with medications . Most recent eGFR: ~72 mL/min o Obesity, hx prediabtes: Victoza 1.2 mg daily o Mood disorder: fluoxetine 20 mg daily, fluphenazine 2.5 mg daily, benztropine 1 mg daily, hydroxyzine 25 mg PRN; topiramate 100 mg daily on fill history per provider Ander Slade, but patient unable to review meds today as she was not at home o Chronic pain: follows w/ orthopedics, as well as new pain management provider in [place]: etodolac 500 mg BID, though also script for meloxicam 15 mg on fill hx per ortho provider Prescott Parma, but patient unable to review meds with me today, gabapentin 300 mg TID, oxycodone/APAP 10/325 mg Q6H PRN o HTN: metoprolol succinate 25 mg daily, amlodipine 5 mg daily, spironolactone 12.5 mg daily o HLD: pravastatin 10 mg QPM o Allergies: cetirizine 10 mg QAM o GERD: pantoprazole 40 mg daily,  o Nail infection: terbinafine 250 mg daily x 3 months, reports she has been taking for ~ 2 months now  Pharmacist Clinical Goal(s):  Marland Kitchen Over the next 90 days, patient will work with PharmD and provider towards optimized medication management  Interventions: . Comprehensive medication review performed; medication list updated in electronic medical record . Inter-disciplinary care team collaboration (see longitudinal plan of care) . Reiterated that she can increase Victoza to 1.8 mg daily after 1 week on 1.2 mg daily. She verbalized understanding. Notes that she feels "much better", but unsure of specific weight.  . Reviewed to bring all medications to her upcoming appointment w/ PCP for review. Will mail an updated medication list to her.   Patient Self Care Activities:  . Patient will take medications as prescribed and will focus on improved adherence through use of pill box  Please see past updates related to this goal by clicking on the "Past Updates"  button in the selected goal          Plan:  - Scheduled f/u call in ~ 8 weeks  Catie [name], PharmD, [place], [place] Pharmacist [place] [phone]

## 2020-04-04 DIAGNOSIS — M545 Low back pain: Secondary | ICD-10-CM | POA: Diagnosis not present

## 2020-04-04 DIAGNOSIS — Z79891 Long term (current) use of opiate analgesic: Secondary | ICD-10-CM | POA: Diagnosis not present

## 2020-04-04 DIAGNOSIS — F4542 Pain disorder with related psychological factors: Secondary | ICD-10-CM | POA: Diagnosis not present

## 2020-04-04 DIAGNOSIS — M47816 Spondylosis without myelopathy or radiculopathy, lumbar region: Secondary | ICD-10-CM | POA: Diagnosis not present

## 2020-04-04 DIAGNOSIS — Z5181 Encounter for therapeutic drug level monitoring: Secondary | ICD-10-CM | POA: Diagnosis not present

## 2020-04-04 DIAGNOSIS — G894 Chronic pain syndrome: Secondary | ICD-10-CM | POA: Diagnosis not present

## 2020-04-04 DIAGNOSIS — M25569 Pain in unspecified knee: Secondary | ICD-10-CM | POA: Diagnosis not present

## 2020-04-04 DIAGNOSIS — M797 Fibromyalgia: Secondary | ICD-10-CM | POA: Diagnosis not present

## 2020-04-12 ENCOUNTER — Ambulatory Visit: Payer: Medicare Other | Admitting: Internal Medicine

## 2020-04-12 ENCOUNTER — Ambulatory Visit: Payer: Medicare HMO | Admitting: Internal Medicine

## 2020-04-12 ENCOUNTER — Other Ambulatory Visit: Payer: Self-pay

## 2020-04-17 ENCOUNTER — Ambulatory Visit: Payer: Medicare Other | Admitting: Internal Medicine

## 2020-04-18 ENCOUNTER — Ambulatory Visit: Payer: Medicare Other | Admitting: Obstetrics and Gynecology

## 2020-04-19 ENCOUNTER — Ambulatory Visit (INDEPENDENT_AMBULATORY_CARE_PROVIDER_SITE_OTHER): Payer: Medicare Other | Admitting: Podiatry

## 2020-04-19 ENCOUNTER — Other Ambulatory Visit: Payer: Self-pay

## 2020-04-19 ENCOUNTER — Encounter: Payer: Self-pay | Admitting: Podiatry

## 2020-04-19 DIAGNOSIS — M79676 Pain in unspecified toe(s): Secondary | ICD-10-CM

## 2020-04-19 DIAGNOSIS — B351 Tinea unguium: Secondary | ICD-10-CM

## 2020-04-19 NOTE — Progress Notes (Signed)
This patient returns to the office for evaluation and treatment of long thick painful nails .  This patient is unable to trim his own nails since the patient cannot reach the feet.  Patient says the nails are painful walking and wearing her shoes. She returns for preventive foot care services.  General Appearance  Alert, conversant and in no acute stress.  Vascular  Dorsalis pedis and posterior tibial  pulses are palpable  bilaterally.  Capillary return is within normal limits  bilaterally. Temperature is within normal limits  bilaterally.  Neurologic  Senn-Weinstein monofilament wire test within normal limits  bilaterally. Muscle power within normal limits bilaterally.  Nails Thick disfigured discolored nails with subungual debris  from hallux to fifth toes bilaterally. No evidence of bacterial infection or drainage bilaterally.  Orthopedic  No limitations of motion  feet .  No crepitus or effusions noted.  No bony pathology or digital deformities noted.  Skin  normotropic skin with no porokeratosis noted bilaterally.  No signs of infections or ulcers noted.     Onychomycosis  Pain in toes right foot  Pain in toes left foot  Debridement  of nails  1-5  B/L with a nail nipper.  Nails were then filed using a dremel tool with no incidents.  Patient requests nail surgery for the permanent removal of right big toenail in future.   RTC  prn    Gardiner Barefoot DPM

## 2020-04-20 ENCOUNTER — Encounter: Payer: Self-pay | Admitting: Internal Medicine

## 2020-04-20 ENCOUNTER — Ambulatory Visit (INDEPENDENT_AMBULATORY_CARE_PROVIDER_SITE_OTHER): Payer: Medicare Other | Admitting: Internal Medicine

## 2020-04-20 VITALS — BP 128/80 | HR 89 | Temp 98.3°F | Ht 63.0 in | Wt 242.8 lb

## 2020-04-20 DIAGNOSIS — I10 Essential (primary) hypertension: Secondary | ICD-10-CM | POA: Diagnosis not present

## 2020-04-20 DIAGNOSIS — M25461 Effusion, right knee: Secondary | ICD-10-CM | POA: Insufficient documentation

## 2020-04-20 DIAGNOSIS — J309 Allergic rhinitis, unspecified: Secondary | ICD-10-CM

## 2020-04-20 DIAGNOSIS — G4733 Obstructive sleep apnea (adult) (pediatric): Secondary | ICD-10-CM

## 2020-04-20 DIAGNOSIS — Z87898 Personal history of other specified conditions: Secondary | ICD-10-CM

## 2020-04-20 DIAGNOSIS — Z1329 Encounter for screening for other suspected endocrine disorder: Secondary | ICD-10-CM

## 2020-04-20 DIAGNOSIS — G8929 Other chronic pain: Secondary | ICD-10-CM | POA: Insufficient documentation

## 2020-04-20 DIAGNOSIS — Z6841 Body Mass Index (BMI) 40.0 and over, adult: Secondary | ICD-10-CM

## 2020-04-20 DIAGNOSIS — Z0000 Encounter for general adult medical examination without abnormal findings: Secondary | ICD-10-CM | POA: Diagnosis not present

## 2020-04-20 DIAGNOSIS — F39 Unspecified mood [affective] disorder: Secondary | ICD-10-CM

## 2020-04-20 DIAGNOSIS — L600 Ingrowing nail: Secondary | ICD-10-CM | POA: Insufficient documentation

## 2020-04-20 DIAGNOSIS — L509 Urticaria, unspecified: Secondary | ICD-10-CM

## 2020-04-20 DIAGNOSIS — K219 Gastro-esophageal reflux disease without esophagitis: Secondary | ICD-10-CM | POA: Diagnosis not present

## 2020-04-20 DIAGNOSIS — E785 Hyperlipidemia, unspecified: Secondary | ICD-10-CM

## 2020-04-20 DIAGNOSIS — M25561 Pain in right knee: Secondary | ICD-10-CM | POA: Insufficient documentation

## 2020-04-20 DIAGNOSIS — Z1389 Encounter for screening for other disorder: Secondary | ICD-10-CM

## 2020-04-20 HISTORY — DX: Encounter for general adult medical examination without abnormal findings: Z00.00

## 2020-04-20 HISTORY — DX: Effusion, right knee: M25.561

## 2020-04-20 HISTORY — DX: Ingrowing nail: L60.0

## 2020-04-20 HISTORY — DX: Pain in right knee: M25.461

## 2020-04-20 MED ORDER — LIRAGLUTIDE 18 MG/3ML ~~LOC~~ SOPN: 1.8000 mg | PEN_INJECTOR | Freq: Every day | SUBCUTANEOUS | 11 refills | Status: DC

## 2020-04-20 MED ORDER — CETIRIZINE HCL 10 MG PO TABS: 10.0000 mg | ORAL_TABLET | Freq: Every day | ORAL | 3 refills | Status: DC | PRN

## 2020-04-20 MED ORDER — PANTOPRAZOLE SODIUM 40 MG PO TBEC: 40.0000 mg | DELAYED_RELEASE_TABLET | Freq: Every day | ORAL | 3 refills | Status: DC

## 2020-04-20 MED ORDER — SPIRONOLACTONE 25 MG PO TABS: 12.5000 mg | ORAL_TABLET | Freq: Every day | ORAL | 3 refills | Status: DC

## 2020-04-20 MED ORDER — HYDROXYZINE HCL 25 MG PO TABS: 25.0000 mg | ORAL_TABLET | Freq: Three times a day (TID) | ORAL | 3 refills | Status: DC | PRN

## 2020-04-20 MED ORDER — PRAVASTATIN SODIUM 10 MG PO TABS: 10.0000 mg | ORAL_TABLET | Freq: Every day | ORAL | 3 refills | Status: DC

## 2020-04-20 MED ORDER — AMLODIPINE BESYLATE 5 MG PO TABS: 5.0000 mg | ORAL_TABLET | Freq: Every day | ORAL | 3 refills | Status: DC

## 2020-04-20 MED ORDER — METOPROLOL SUCCINATE ER 25 MG PO TB24: 25.0000 mg | ORAL_TABLET | Freq: Every day | ORAL | 3 refills | Status: DC

## 2020-04-20 NOTE — Progress Notes (Signed)
Patient flagged: Current status:  PATIENT IS OVERDUE FOR BMI FOLLOW UP PLAN BMI is estimated to be 43 based on the last recorded weight and height

## 2020-04-20 NOTE — Progress Notes (Signed)
Chief Complaint  Patient presents with  . Follow-up   Annual  1. HTN on norvasc 5 mg qd, spironolactone 12. 5 mg qd, toprol 25 mg qd xl  2. Obesity lost 8 lbs on 1.8 victoza  3. Right knee inner pain and swelling mild at times not always tried mobic 15 mg qd and not helping nor lodine  F/u [facility] and they may have to do a procedure on her right knee  4. Right great toe ingrown toenail 05/07/20 will get toenail removal    Review of Systems  Constitutional: Positive for weight loss.  HENT: Negative for hearing loss.   Eyes: Negative for blurred vision.  Respiratory: Negative for shortness of breath.   Cardiovascular: Negative for chest pain.  Gastrointestinal: Negative for abdominal pain.  Musculoskeletal: Negative for falls.  Skin: Negative for rash.  Neurological: Negative for headaches.  Psychiatric/Behavioral: Negative for depression.   Past Medical History:  Diagnosis Date  . Allergic rhinitis   . Bipolar disorder ([place])   . Chronic low back pain   . CTS (carpal tunnel syndrome)    hands  . Depression   . Gallstones   . GERD (gastroesophageal reflux disease)   . History of prediabetes   . Hypertension   . Laceration of right foot   . Onychomycosis   . OSA on CPAP   . Overactive bladder   . PVC's (premature ventricular contractions)   . Seizures ([place])    8th grade   Past Surgical History:  Procedure Laterality Date  . ABDOMINAL HYSTERECTOMY  07/08/2011  . ABDOMINAL HYSTERECTOMY     supracervical in 07/2011   . CARPAL TUNNEL RELEASE     right hand 1996  . CESAREAN SECTION     2012  . CHOLECYSTECTOMY N/A 01/12/2018   Procedure: LAPAROSCOPIC CHOLECYSTECTOMY WITH INTRAOPERATIVE CHOLANGIOGRAM ERAS PATHWAY;  Surgeon: Johnathan Hausen, MD;  Location: [facility];  Service: General;  Laterality: N/A;  . COLONOSCOPY WITH PROPOFOL N/A 04/15/2019   Procedure: COLONOSCOPY WITH PROPOFOL;  Surgeon: Jonathon Bellows, MD;  Location: [facility] ENDOSCOPY;  Service: Gastroenterology;  Laterality:  N/A;  . DILATION AND CURETTAGE OF UTERUS     04/2011 benign endometrial polyp squamous metaplasia, inflamm, blood, mucous, benign   . FOOT SURGERY     Dr. Prudence Davidson   . LEEP     2006  . TUBAL LIGATION     Family History  Problem Relation Age of Onset  . Cancer Father        esophageal   . Hypertension Father   . Heart failure Mother   . Hypertension Mother   . Early death Mother   . Heart disease Mother        chf  . Breast cancer Sister 68  . Cancer Sister        breast  . Depression Sister   . Mental illness Sister   . Asthma Son   . Cancer Other        brain cancer   Social History   Socioeconomic History  . Marital status: Married    Spouse name: Not on file  . Number of children: Not on file  . Years of education: Not on file  . Highest education level: Not on file  Occupational History  . Not on file  Tobacco Use  . Smoking status: Never Smoker  . Smokeless tobacco: Former Systems developer    Types: Snuff  . Tobacco comment: Is in process of decreasing amount  Vaping Use  .  Vaping Use: Never used  Substance and Sexual Activity  . Alcohol use: No    Alcohol/week: 0.0 standard drinks  . Drug use: No  . Sexual activity: Yes    Comment: men  Other Topics Concern  . Not on file  Social History Narrative   Married    No guns    Wears seat belt    Safe in relationship    Never smoker    2 sons Gaspar Bidding ~10 and another son ~28 as of 10/2019   1 granson eli 1 y.o as of 10/2019   2 sisters    Unemployed       Social Determinants of Health   Financial Resource Strain: Low Risk   . Difficulty of Paying Living Expenses: Not hard at all  Food Insecurity: No Food Insecurity  . Worried About Charity fundraiser in the Last Year: Never true  . Ran Out of Food in the Last Year: Never true  Transportation Needs: No Transportation Needs  . Lack of Transportation (Medical): No  . Lack of Transportation (Non-Medical): No  Physical Activity: Insufficiently Active  . Days of  Exercise per Week: 3 days  . Minutes of Exercise per Session: 30 min  Stress: No Stress Concern Present  . Feeling of Stress : Not at all  Social Connections:   . Frequency of Communication with Friends and Family:   . Frequency of Social Gatherings with Friends and Family:   . Attends Religious Services:   . Active Member of Clubs or Organizations:   . Attends Archivist Meetings:   Marland Kitchen Marital Status:   Intimate Partner Violence: Not At Risk  . Fear of Current or Ex-Partner: No  . Emotionally Abused: No  . Physically Abused: No  . Sexually Abused: No   Current Meds  Medication Sig  . amLODipine (NORVASC) 5 MG tablet Take 1 tablet (5 mg total) by mouth daily.  . APPLE CIDER VINEGAR PO Take by mouth.  . benztropine (COGENTIN) 1 MG tablet Take 1 mg by mouth daily as needed (anxiety).   . Blood Glucose Calibration (TRUE METRIX LEVEL 1) Low SOLN   . cetirizine (ZYRTEC) 10 MG tablet Take 1 tablet (10 mg total) by mouth daily as needed for allergies.  Marland Kitchen EPINEPHrine 0.3 mg/0.3 mL IJ SOAJ injection Inject 0.3 mLs (0.3 mg total) into the muscle as needed for anaphylaxis.  Marland Kitchen FLUoxetine (PROZAC) 20 MG capsule Take 20 mg by mouth daily.  . fluPHENAZine (PROLIXIN) 2.5 MG tablet Take 2.5 mg by mouth at bedtime.   . fluticasone (FLONASE) 50 MCG/ACT nasal spray Place 2 sprays into both nostrils daily.  Marland Kitchen gabapentin (NEURONTIN) 300 MG capsule Take 1 capsule (300 mg total) by mouth 3 (three) times daily.  . hydrOXYzine (ATARAX/VISTARIL) 25 MG tablet Take 1 tablet (25 mg total) by mouth every 8 (eight) hours as needed for itching. Or itchy rash do not take with zyrtec at the same time  . Insulin Pen Needle (PEN NEEDLES) 30G X 8 MM MISC 1 Device by Does not apply route daily.  Marland Kitchen liraglutide (VICTOZA) 18 MG/3ML SOPN Inject 0.3 mLs (1.8 mg total) into the skin daily.  . metoprolol succinate (TOPROL-XL) 25 MG 24 hr tablet Take 1 tablet (25 mg total) by mouth daily. Take with or immediately  following a meal.  . nystatin cream (MYCOSTATIN) Apply 1 application topically 2 (two) times daily.  Marland Kitchen oxyCODONE-acetaminophen (PERCOCET) 10-325 MG tablet Take 1 tablet by mouth every 6 (  six) hours as needed for pain. (Patient taking differently: Take 1 tablet by mouth every 8 (eight) hours as needed for pain (Dr. Mirna Mires pain). )  . pantoprazole (PROTONIX) 40 MG tablet Take 1 tablet (40 mg total) by mouth daily.  . polyethylene glycol (MIRALAX / GLYCOLAX) packet Take 17 g by mouth daily as needed for mild constipation.  . pravastatin (PRAVACHOL) 10 MG tablet Take 1 tablet (10 mg total) by mouth daily.  . Probiotic Product (PROBIOTIC DAILY PO) Take by mouth. probiogen probiotic daily  . terbinafine (LAMISIL) 250 MG tablet Take 1 tablet (250 mg total) by mouth daily. Dc after 3 months  . topiramate (TOPAMAX) 100 MG tablet Take 100 mg by mouth daily.  Marland Kitchen triamcinolone cream (KENALOG) 0.1 % Apply 1 application topically 2 (two) times daily.  . [DISCONTINUED] amLODipine (NORVASC) 5 MG tablet Take 1 tablet (5 mg total) by mouth daily.  . [DISCONTINUED] cetirizine (ZYRTEC) 10 MG tablet Take 1 tablet (10 mg total) by mouth daily as needed for allergies.  . [DISCONTINUED] hydrOXYzine (ATARAX/VISTARIL) 25 MG tablet Take 1 tablet (25 mg total) by mouth every 8 (eight) hours as needed for itching. Or itchy rash do not take with zyrtec at the same time  . [DISCONTINUED] liraglutide (VICTOZA) 18 MG/3ML SOPN Inject 0.1 mLs (0.6 mg total) into the skin daily. X 1 week. Increase to 1.2 mg daily x week 2. If able to tolerate increase to 1.8 daily week 3 and beyond  . [DISCONTINUED] metoprolol succinate (TOPROL-XL) 25 MG 24 hr tablet Take 1 tablet (25 mg total) by mouth daily. Take with or immediately following a meal.  . [DISCONTINUED] pantoprazole (PROTONIX) 40 MG tablet Take 1 tablet (40 mg total) by mouth daily.  . [DISCONTINUED] pravastatin (PRAVACHOL) 10 MG tablet   . [DISCONTINUED] spironolactone (ALDACTONE)  12.5 mg TABS tablet Take 12.5 mg by mouth daily. In am   Allergies  Allergen Reactions  . Cephalexin Swelling and Rash  . Hctz [Hydrochlorothiazide] Rash and Swelling    Mild she says  . Lisinopril Hives and Other (See Comments)    Headache, Low BP, angioedema    . Losartan Hives and Swelling    Angioedema  . Penicillins Rash    Has patient had a PCN reaction causing immediate rash, facial/tongue/throat swelling, SOB or lightheadedness with hypotension: Yes Has patient had a PCN reaction causing severe rash involving mucus membranes or skin necrosis: No Has patient had a PCN reaction that required hospitalization: No Has patient had a PCN reaction occurring within the last 10 years: No If all of the above answers are "NO", then may proceed with Cephalosporin use.   . Bee Venom     Swelling   . Metformin And Related     Rash    No results found for this or any previous visit (from the past 2160 hour(s)). Objective  Body mass index is 43.01 kg/m. Wt Readings from Last 3 Encounters:  04/20/20 242 lb 12.8 oz (110.1 kg)  03/01/20 255 lb (115.7 kg)  01/10/20 250 lb 3.2 oz (113.5 kg)   Temp Readings from Last 3 Encounters:  04/20/20 98.3 F (36.8 C) (Oral)  01/19/20 97.9 F (36.6 C)  01/10/20 97.6 F (36.4 C) (Temporal)   BP Readings from Last 3 Encounters:  04/20/20 128/80  03/01/20 (!) 144/81  01/10/20 118/76   Pulse Readings from Last 3 Encounters:  04/20/20 89  03/01/20 86  01/10/20 81    Physical Exam Vitals and nursing note reviewed.  Constitutional:      Appearance: Normal appearance. She is well-developed and well-groomed. She is morbidly obese.  HENT:     Head: Normocephalic and atraumatic.  Eyes:     Conjunctiva/sclera: Conjunctivae normal.     Pupils: Pupils are equal, round, and reactive to light.  Cardiovascular:     Rate and Rhythm: Normal rate and regular rhythm.     Heart sounds: Normal heart sounds. No murmur heard.   Pulmonary:      Effort: Pulmonary effort is normal.     Breath sounds: Normal breath sounds.  Abdominal:     General: Abdomen is flat. Bowel sounds are normal.     Tenderness: There is no abdominal tenderness.  Neurological:     General: No focal deficit present.     Mental Status: She is alert and oriented to person, place, and time. Mental status is at baseline.     Gait: Gait normal.  Psychiatric:        Attention and Perception: Attention and perception normal.        Mood and Affect: Mood and affect normal.        Speech: Speech normal.        Behavior: Behavior normal. Behavior is cooperative.        Thought Content: Thought content normal.        Cognition and Memory: Cognition and memory normal.        Judgment: Judgment normal.     Assessment  Plan  Annual physical exam rec healthy diet and exercise Declines flu shotallergic Tdap utd due 7/13/2020will do in future at f/u Allergic to pna vaccine Consider shingrix but given allergies use caution Not immuneMMR, hep B titer in futureuse caution though with h/o allergies with all vaccines covid 19 vaccine 2/2   h/o supracervical hysterectomy ([facility]), pap 02/29/16 negative no comment HPV, another pap 06/23/17 neg pap neg HPV  -h/o LEEP 2006  mammo 08/04/19 negative  Colonoscopy7/10/20 normal f/u in 10 years  Echo Dr.Kowalski  -nl LV function EF 60% moderate mitral insuff and mild tricuspid insufficiency treadmill stress test neg 07/30/13  -referred again today   HIV neg 02/04/16  Ob/gyn appt pending bladder leakage [facility]pped [facility] w/o help worsen sneezing or coughing) -Dr. Georgianne Fick pt wants surgeryas of 04/20/20 sx's better and no surgery indicated    Essential hypertension controlled- Plan: amLODipine (NORVASC) 5 MG tablet, metoprolol succinate (TOPROL-XL) 25 MG 24 hr tablet,  spironolactone (ALDACTONE) 25 MG tablet Comprehensive metabolic panel, Lipid panel, CBC with  Differential/Platelet  Gastroesophageal reflux disease - Plan: pantoprazole (PROTONIX) 40 MG tablet  Morbid obesity ([place]) - Plan: liraglutide (VICTOZA) 18 MG/3ML SOPN Morbid obesity with BMI of 40.0-44.9, adult ([place]) - Plan: liraglutide (VICTOZA) 18 MG/3ML SOPN rec healthy diet and exercise   Hyperlipidemia, unspecified hyperlipidemia type - Plan: pravastatin (PRAVACHOL) 10 MG tablet, Lipid panel  History of prediabetes - Plan: liraglutide (VICTOZA) 18 MG/3ML SOPN  Hives - Plan: hydrOXYzine (ATARAX/VISTARIL) 25 MG tablet  Allergic rhinitis, unspecified seasonality, unspecified trigger - Plan: cetirizine (ZYRTEC) 10 MG tablet  Ingrown right big toenail Great toenail removal foot md 05/07/20   Pain and swelling of right knee  tbd knee procedure/surgery [facility]    Provider: Dr. Olivia Mackie McLean-Scocuzza-Internal Medicine

## 2020-04-24 ENCOUNTER — Ambulatory Visit: Payer: Medicare Other | Admitting: Internal Medicine

## 2020-04-25 DIAGNOSIS — M47816 Spondylosis without myelopathy or radiculopathy, lumbar region: Secondary | ICD-10-CM | POA: Diagnosis not present

## 2020-04-25 DIAGNOSIS — G894 Chronic pain syndrome: Secondary | ICD-10-CM | POA: Diagnosis not present

## 2020-04-25 DIAGNOSIS — M797 Fibromyalgia: Secondary | ICD-10-CM | POA: Diagnosis not present

## 2020-04-25 DIAGNOSIS — Z79891 Long term (current) use of opiate analgesic: Secondary | ICD-10-CM | POA: Diagnosis not present

## 2020-05-03 ENCOUNTER — Ambulatory Visit (INDEPENDENT_AMBULATORY_CARE_PROVIDER_SITE_OTHER): Payer: Medicare Other | Admitting: Obstetrics and Gynecology

## 2020-05-03 ENCOUNTER — Encounter: Payer: Self-pay | Admitting: Obstetrics and Gynecology

## 2020-05-03 ENCOUNTER — Other Ambulatory Visit: Payer: Self-pay

## 2020-05-03 VITALS — BP 133/83 | HR 97 | Ht 63.0 in | Wt 245.0 lb

## 2020-05-03 DIAGNOSIS — E66813 Obesity, class 3: Secondary | ICD-10-CM

## 2020-05-03 DIAGNOSIS — N761 Subacute and chronic vaginitis: Secondary | ICD-10-CM

## 2020-05-03 DIAGNOSIS — Z6841 Body Mass Index (BMI) 40.0 and over, adult: Secondary | ICD-10-CM | POA: Diagnosis not present

## 2020-05-03 MED ORDER — PHENTERMINE HCL 37.5 MG PO TABS: 18.7500 mg | ORAL_TABLET | Freq: Every day | ORAL | 0 refills | Status: DC

## 2020-05-03 MED ORDER — NYSTATIN 100000 UNIT/GM EX POWD
1.0000 | Freq: Three times a day (TID) | CUTANEOUS | 0 refills | Status: DC
Start: 2020-05-03 — End: 2020-07-24

## 2020-05-03 NOTE — Progress Notes (Signed)
Gynecology Office Visit  Chief Complaint:  Chief Complaint  Patient presents with  . Follow-up    Vaginitis, discharge w/odor    History of Present Illness: Patientis a 54 y.o. G42P2002 female, who presents for the evaluation of weight gain. She has gained 3 pounds primarily over 12 months, but reports problems loosing weight. The patient states the following issues have contributed to her weight problem: decreased time for physical activity, dietary choices The patient has no additional symptoms. The patient specifically denies memory loss, muscle weakness, excessive thirst, and polyuria. Weight related co-morbidities include HTN, OSA,and insulin resistance.  She has tried not interventions in the past.   Vaginal symptoms have improved since start of topical steroid.  She bring in 8 different topical products she has been using as well OTC.    Review of Systems: 10 point review of systems negative unless otherwise noted in HPI  Past Medical History:  Patient Active Problem List   Diagnosis Date Noted  . Annual physical exam 04/20/2020  . Ingrown right big toenail 04/20/2020  . Gastroesophageal reflux disease 04/20/2020  . Pain and swelling of right knee 04/20/2020  . Mitral valve insufficiency 01/10/2020  . Bacterial vaginosis 10/20/2019  . Encounter for screening colonoscopy   . Hives 02/23/2019  . Vitamin D deficiency 02/01/2019  . Mood disorder ([place]) 12/24/2018  . HLD (hyperlipidemia) 09/01/2018  . History of prediabetes 09/01/2018    A1C 02/04/16 5.9    . Chronic pain 08/31/2018    MRI 03/05/16 shallow disc bulge L3/4 w/o sig central canal stenosis L3/4, moderately dessicated, thickening of ligamentum flavum and facet arthropathy throughout all lumbar levels    . Allergic rhinitis 08/31/2018  . S/P laparoscopic cholecystectomy April 2019 01/12/2018  . Angioedema 07/07/2017  . Bilateral carpal tunnel syndrome 11/03/2016  . Frequent PVCs 09/24/2016  . Morbid obesity with  BMI of 40.0-44.9, adult ([place]) 08/07/2016  . Essential hypertension 07/10/2016  . OSA on CPAP 07/10/2016    Past Surgical History:  Past Surgical History:  Procedure Laterality Date  . ABDOMINAL HYSTERECTOMY  07/08/2011  . ABDOMINAL HYSTERECTOMY     supracervical in 07/2011   . CARPAL TUNNEL RELEASE     right hand 1996  . CESAREAN SECTION     2012  . CHOLECYSTECTOMY N/A 01/12/2018   Procedure: LAPAROSCOPIC CHOLECYSTECTOMY WITH INTRAOPERATIVE CHOLANGIOGRAM ERAS PATHWAY;  Surgeon: Johnathan Hausen, MD;  Location: [facility];  Service: General;  Laterality: N/A;  . COLONOSCOPY WITH PROPOFOL N/A 04/15/2019   Procedure: COLONOSCOPY WITH PROPOFOL;  Surgeon: Jonathon Bellows, MD;  Location: [facility] ENDOSCOPY;  Service: Gastroenterology;  Laterality: N/A;  . DILATION AND CURETTAGE OF UTERUS     04/2011 benign endometrial polyp squamous metaplasia, inflamm, blood, mucous, benign   . FOOT SURGERY     Dr. Prudence Davidson   . LEEP     2006  . TUBAL LIGATION      Gynecologic History: No LMP recorded. Patient has had a hysterectomy.  Obstetric History: [id number]  Family History:  Family History  Problem Relation Age of Onset  . Cancer Father        esophageal   . Hypertension Father   . Heart failure Mother   . Hypertension Mother   . Early death Mother   . Heart disease Mother        chf  . Breast cancer Sister 52  . Cancer Sister        breast  . Depression Sister   . Mental illness Sister   .  Asthma Son   . Cancer Other        brain cancer    Social History:  Social History   Socioeconomic History  . Marital status: Married    Spouse name: Not on file  . Number of children: Not on file  . Years of education: Not on file  . Highest education level: Not on file  Occupational History  . Not on file  Tobacco Use  . Smoking status: Never Smoker  . Smokeless tobacco: Former Systems developer    Types: Snuff  . Tobacco comment: Is in process of decreasing amount  Vaping Use  . Vaping Use: Never used    Substance and Sexual Activity  . Alcohol use: No    Alcohol/week: 0.0 standard drinks  . Drug use: No  . Sexual activity: Yes    Comment: men  Other Topics Concern  . Not on file  Social History Narrative   Married    No guns    Wears seat belt    Safe in relationship    Never smoker    2 sons Gaspar Bidding ~10 and another son ~28 as of 10/2019   1 granson eli 1 y.o as of 10/2019   2 sisters    Unemployed       Social Determinants of Health   Financial Resource Strain: Low Risk   . Difficulty of Paying Living Expenses: Not hard at all  Food Insecurity: No Food Insecurity  . Worried About Charity fundraiser in the Last Year: Never true  . Ran Out of Food in the Last Year: Never true  Transportation Needs: No Transportation Needs  . Lack of Transportation (Medical): No  . Lack of Transportation (Non-Medical): No  Physical Activity: Insufficiently Active  . Days of Exercise per Week: 3 days  . Minutes of Exercise per Session: 30 min  Stress: No Stress Concern Present  . Feeling of Stress : Not at all  Social Connections:   . Frequency of Communication with Friends and Family:   . Frequency of Social Gatherings with Friends and Family:   . Attends Religious Services:   . Active Member of Clubs or Organizations:   . Attends Archivist Meetings:   Marland Kitchen Marital Status:   Intimate Partner Violence: Not At Risk  . Fear of Current or Ex-Partner: No  . Emotionally Abused: No  . Physically Abused: No  . Sexually Abused: No    Allergies:  Allergies  Allergen Reactions  . Cephalexin Swelling and Rash  . Hctz [Hydrochlorothiazide] Rash and Swelling    Mild she says  . Lisinopril Hives and Other (See Comments)    Headache, Low BP, angioedema    . Losartan Hives and Swelling    Angioedema  . Penicillins Rash    Has patient had a PCN reaction causing immediate rash, facial/tongue/throat swelling, SOB or lightheadedness with hypotension: Yes Has patient had a PCN reaction  causing severe rash involving mucus membranes or skin necrosis: No Has patient had a PCN reaction that required hospitalization: No Has patient had a PCN reaction occurring within the last 10 years: No If all of the above answers are "NO", then may proceed with Cephalosporin use.   . Bee Venom     Swelling   . Metformin And Related     Rash     Medications: Prior to Admission medications   Medication Sig Start Date End Date Taking? Authorizing Provider  amLODipine (NORVASC) 5 MG tablet Take 1  tablet (5 mg total) by mouth daily. 04/20/20 04/20/21 Yes McLean-Scocuzza, Nino Glow, MD  APPLE CIDER VINEGAR PO Take by mouth.   Yes [provider]  benztropine (COGENTIN) 1 MG tablet Take 1 mg by mouth daily as needed (anxiety).    Yes [provider]  Blood Glucose Calibration (TRUE METRIX LEVEL 1) Low SOLN  01/12/20  Yes [provider]  cetirizine (ZYRTEC) 10 MG tablet Take 1 tablet (10 mg total) by mouth daily as needed for allergies. 04/20/20  Yes McLean-Scocuzza, Nino Glow, MD  EPINEPHrine 0.3 mg/0.3 mL IJ SOAJ injection Inject 0.3 mLs (0.3 mg total) into the muscle as needed for anaphylaxis. 02/19/19  Yes Merlyn Lot, MD  FLUoxetine (PROZAC) 20 MG capsule Take 20 mg by mouth daily.   Yes [provider]  fluPHENAZine (PROLIXIN) 2.5 MG tablet Take 2.5 mg by mouth at bedtime.    Yes [provider]  fluticasone (FLONASE) 50 MCG/ACT nasal spray Place 2 sprays into both nostrils daily. 08/30/19  Yes McLean-Scocuzza, Nino Glow, MD  gabapentin (NEURONTIN) 300 MG capsule Take 1 capsule (300 mg total) by mouth 3 (three) times daily. 08/30/19  Yes McLean-Scocuzza, Nino Glow, MD  hydrOXYzine (ATARAX/VISTARIL) 25 MG tablet Take 1 tablet (25 mg total) by mouth every 8 (eight) hours as needed for itching. Or itchy rash do not take with zyrtec at the same time 04/20/20  Yes McLean-Scocuzza, Nino Glow, MD  Insulin Pen Needle (PEN NEEDLES) 30G X 8 MM MISC 1 Device by Does  not apply route daily. 03/12/20  Yes McLean-Scocuzza, Nino Glow, MD  liraglutide (VICTOZA) 18 MG/3ML SOPN Inject 0.3 mLs (1.8 mg total) into the skin daily. 04/20/20  Yes McLean-Scocuzza, Nino Glow, MD  metoprolol succinate (TOPROL-XL) 25 MG 24 hr tablet Take 1 tablet (25 mg total) by mouth daily. Take with or immediately following a meal. 04/20/20  Yes McLean-Scocuzza, Nino Glow, MD  nystatin cream (MYCOSTATIN) Apply 1 application topically 2 (two) times daily. 03/01/20  Yes Malachy Mood, MD  pantoprazole (PROTONIX) 40 MG tablet Take 1 tablet (40 mg total) by mouth daily. 04/20/20  Yes McLean-Scocuzza, Nino Glow, MD  polyethylene glycol (MIRALAX / GLYCOLAX) packet Take 17 g by mouth daily as needed for mild constipation.   Yes [provider]  pravastatin (PRAVACHOL) 10 MG tablet Take 1 tablet (10 mg total) by mouth daily. 04/20/20  Yes McLean-Scocuzza, Nino Glow, MD  Probiotic Product (PROBIOTIC DAILY PO) Take by mouth. probiogen probiotic daily   Yes [provider]  spironolactone (ALDACTONE) 25 MG tablet Take 0.5 tablets (12.5 mg total) by mouth daily. Am 04/20/20  Yes McLean-Scocuzza, Nino Glow, MD  terbinafine (LAMISIL) 250 MG tablet Take 1 tablet (250 mg total) by mouth daily. Dc after 3 months 08/30/19  Yes McLean-Scocuzza, Nino Glow, MD  topiramate (TOPAMAX) 100 MG tablet Take 100 mg by mouth daily. 03/12/20  Yes [provider]  triamcinolone cream (KENALOG) 0.1 % Apply 1 application topically 2 (two) times daily. 03/01/20  Yes Malachy Mood, MD    Physical Exam Blood pressure (!) 133/83, pulse 97, height 5\' 3"  (1.6 m), weight (!) 245 lb (111.1 kg). No LMP recorded. Patient has had a hysterectomy. Body mass index is 43.4 kg/m.  General: NAD HEENT: normocephalic, anicteric Pulmonary: no increased work of breathing Genitourinary:  External: Normal external female genitalia.  Normal urethral meatus, normal Bartholin's and Skene's glands.    Vagina: Normal vaginal mucosa,  no evidence of prolapse.    Rectal: deferred  Lymphatic: no evidence of inguinal lymphadenopathy Neurologic: Grossly intact Psychiatric: mood appropriate, affect full  Assessment: 54 y.o. [id number] presenting for discussion of weight loss management options  Plan: Problem List Items Addressed This Visit    None    Visit Diagnoses    Class 3 severe obesity with serious comorbidity and body mass index (BMI) of 40.0 to 44.9 in adult, unspecified obesity type ([place])    -  Primary   Relevant Medications   phentermine (ADIPEX-P) 37.5 MG tablet   Chronic vaginitis         Obesity  1) 1500 Calorie ADA Diet  2) Patient education given regarding appropriate lifestyle changes for weight loss including: regular physical activity, healthy coping strategies, caloric restriction and healthy eating patterns.  3) Patient will be started on weight loss medication. The risks and benefits and side effects of medication, such as Adipex (Phenteramine) ,  Tenuate (Diethylproprion), , Contrave (buproprion/naltrexone), Qsymia (phentermine/topiramate), and Saxenda (liraglutide) is discussed. The pros and cons of suppressing appetite and boosting metabolism is discussed. Risks of tolerence and addiction is discussed for selected agents discussed. Use of medicine will ne short term, such as 3-4 months at a time followed by a period of time off of the medicine to avoid these risks and side effects for Adipex, Qsymia, and Tenuate discussed. Pt to call with any negative side effects and agrees to keep follow up appts. - Given majority of weight loss medications are stimulant based I discussed that her BP is a relative contra-indication although it is well controlled on current medication regimen.  In addition phentermine may worse PVC's.  Patient would like to try phentermine.  I discussed starting half dose with caveat of 1 week BP check.  Should BP be elevated on phentermine I would recommend against continuation of  phentermine  5) Encouraged weekly weight monitorig to track progress and sample 1 week food diary  6) 15 minutes face-to-face; counseling/coordination of care > 50 percent of visit  7) Return in about 1 week (around 05/10/2020) for BP check.   Chronic Vaginitis  1) doing better with topical steroid, Rx for nystatin powder tinea corporis panis and breast.  Discontinue use of OTC products as these may actually cause contact dermatitis in some patients   Malachy Mood, MD, [place] [employer] [facility] 05/03/2020, 10:32 AM

## 2020-05-07 ENCOUNTER — Ambulatory Visit (INDEPENDENT_AMBULATORY_CARE_PROVIDER_SITE_OTHER): Payer: Medicare Other | Admitting: Podiatry

## 2020-05-07 ENCOUNTER — Other Ambulatory Visit: Payer: Self-pay

## 2020-05-07 ENCOUNTER — Encounter: Payer: Self-pay | Admitting: Podiatry

## 2020-05-07 DIAGNOSIS — L600 Ingrowing nail: Secondary | ICD-10-CM

## 2020-05-07 NOTE — Progress Notes (Signed)
This patient returns to the office for evaluation and treatment of nail spicule in the inside border right big toe.  She says this is painful and catches mon her socks.  She requests permanent removal of nail spicule.  General Appearance  Alert, conversant and in no acute stress.  Vascular  Dorsalis pedis and posterior tibial  pulses are palpable  bilaterally.  Capillary return is within normal limits  bilaterally. Temperature is within normal limits  bilaterally.  Neurologic  Senn-Weinstein monofilament wire test within normal limits  bilaterally. Muscle power within normal limits bilaterally.  Nails Thick disfigured discolored nails with subungual debris  from hallux to fifth toes bilaterally. No evidence of bacterial infection or drainage bilaterally. Nail spicule medial border right hallux.  No infection noted.  Orthopedic  No limitations of motion  feet .  No crepitus or effusions noted.  No bony pathology or digital deformities noted.  Skin  normotropic skin with no porokeratosis noted bilaterally.  No signs of infections or ulcers noted.     Ingrown toenail /nail spicule medial border right hallux.  Nail surgery.  Treatment options and alternatives discussed.  Recommended permanent phenol matrixectomy and patient agreed.  Right hallux  was prepped with alcohol and a toe block of 3cc of 2% lidocaine plain was administered in a digital toe block. .  The toe was then prepped with betadine solution . A tourniquet was applied to toe. The offending nail border was then excised and matrix tissue exposed.  Phenol was then applied to the matrix tissue followed by an alcohol wash.  Antibiotic ointment and a dry sterile dressing was applied.  The patient was dispensed instructions for aftercare. RTC prn.      Gardiner Barefoot DPM  Gardiner Barefoot DPM

## 2020-05-10 ENCOUNTER — Ambulatory Visit (INDEPENDENT_AMBULATORY_CARE_PROVIDER_SITE_OTHER): Payer: Medicare Other | Admitting: Obstetrics and Gynecology

## 2020-05-10 ENCOUNTER — Other Ambulatory Visit: Payer: Self-pay

## 2020-05-10 ENCOUNTER — Encounter: Payer: Self-pay | Admitting: Obstetrics and Gynecology

## 2020-05-10 VITALS — BP 126/80 | Wt 248.0 lb

## 2020-05-10 DIAGNOSIS — Z0130 Encounter for examination of blood pressure without abnormal findings: Secondary | ICD-10-CM

## 2020-05-10 NOTE — Progress Notes (Signed)
Blood pressure 126/80, weight 248 lb (112.5 kg). Tolerating phentermine well. Nurse visit for BP only

## 2020-05-21 ENCOUNTER — Other Ambulatory Visit: Payer: Self-pay

## 2020-05-21 ENCOUNTER — Other Ambulatory Visit (INDEPENDENT_AMBULATORY_CARE_PROVIDER_SITE_OTHER): Payer: Medicare Other

## 2020-05-21 DIAGNOSIS — I10 Essential (primary) hypertension: Secondary | ICD-10-CM

## 2020-05-21 DIAGNOSIS — Z1389 Encounter for screening for other disorder: Secondary | ICD-10-CM

## 2020-05-21 DIAGNOSIS — Z1329 Encounter for screening for other suspected endocrine disorder: Secondary | ICD-10-CM | POA: Diagnosis not present

## 2020-05-21 DIAGNOSIS — E785 Hyperlipidemia, unspecified: Secondary | ICD-10-CM

## 2020-05-21 LAB — CBC WITH DIFFERENTIAL/PLATELET
Basophils Absolute: 0 10*3/uL (ref 0.0–0.1)
Basophils Relative: 0.8 % (ref 0.0–3.0)
Eosinophils Absolute: 0.2 10*3/uL (ref 0.0–0.7)
Eosinophils Relative: 2.5 % (ref 0.0–5.0)
HCT: 34.8 % — ABNORMAL LOW (ref 36.0–46.0)
Hemoglobin: 12.1 g/dL (ref 12.0–15.0)
Lymphocytes Relative: 32.5 % (ref 12.0–46.0)
Lymphs Abs: 2 10*3/uL (ref 0.7–4.0)
MCHC: 34.7 g/dL (ref 30.0–36.0)
MCV: 84.1 fl (ref 78.0–100.0)
Monocytes Absolute: 0.6 10*3/uL (ref 0.1–1.0)
Monocytes Relative: 9.3 % (ref 3.0–12.0)
Neutro Abs: 3.4 10*3/uL (ref 1.4–7.7)
Neutrophils Relative %: 54.9 % (ref 43.0–77.0)
Platelets: 184 10*3/uL (ref 150.0–400.0)
RBC: 4.14 Mil/uL (ref 3.87–5.11)
RDW: 14.1 % (ref 11.5–15.5)
WBC: 6.2 10*3/uL (ref 4.0–10.5)

## 2020-05-21 LAB — COMPREHENSIVE METABOLIC PANEL
ALT: 18 U/L (ref 0–35)
AST: 15 U/L (ref 0–37)
Albumin: 4 g/dL (ref 3.5–5.2)
Alkaline Phosphatase: 65 U/L (ref 39–117)
BUN: 12 mg/dL (ref 6–23)
CO2: 25 mEq/L (ref 19–32)
Calcium: 8.9 mg/dL (ref 8.4–10.5)
Chloride: 106 mEq/L (ref 96–112)
Creatinine, Ser: 1.03 mg/dL (ref 0.40–1.20)
GFR: 68.1 mL/min (ref >60.00)
Glucose, Bld: 109 mg/dL — ABNORMAL HIGH (ref 70–99)
Potassium: 4.1 mEq/L (ref 3.5–5.1)
Sodium: 139 mEq/L (ref 135–145)
Total Bilirubin: 0.4 mg/dL (ref 0.2–1.2)
Total Protein: 6.3 g/dL (ref 6.0–8.3)

## 2020-05-21 LAB — TSH: TSH: 1.23 u[IU]/mL (ref 0.35–4.50)

## 2020-05-21 LAB — LIPID PANEL
Cholesterol: 127 mg/dL (ref 0–200)
HDL: 51.4 mg/dL (ref >39.00)
LDL Cholesterol: 65 mg/dL (ref 0–99)
NonHDL: 75.17
Total CHOL/HDL Ratio: 2
Triglycerides: 52 mg/dL (ref 0.0–149.0)
VLDL: 10.4 mg/dL (ref 0.0–40.0)

## 2020-05-22 DIAGNOSIS — M170 Bilateral primary osteoarthritis of knee: Secondary | ICD-10-CM | POA: Diagnosis not present

## 2020-05-22 LAB — URINALYSIS, ROUTINE W REFLEX MICROSCOPIC
Bacteria, UA: NONE SEEN /HPF
Bilirubin Urine: NEGATIVE
Glucose, UA: NEGATIVE
Hgb urine dipstick: NEGATIVE
Hyaline Cast: NONE SEEN /LPF
Ketones, ur: NEGATIVE
Leukocytes,Ua: NEGATIVE
Nitrite: NEGATIVE
Protein, ur: NEGATIVE
RBC / HPF: NONE SEEN /HPF (ref 0–2)
Specific Gravity, Urine: 1.018 (ref 1.001–1.03)
Squamous Epithelial / HPF: NONE SEEN /HPF (ref <=5)
WBC, UA: NONE SEEN /HPF (ref 0–5)
pH: 5.5 (ref 5.0–8.0)

## 2020-05-29 DIAGNOSIS — M170 Bilateral primary osteoarthritis of knee: Secondary | ICD-10-CM | POA: Diagnosis not present

## 2020-05-30 DIAGNOSIS — M797 Fibromyalgia: Secondary | ICD-10-CM | POA: Diagnosis not present

## 2020-05-30 DIAGNOSIS — Z79891 Long term (current) use of opiate analgesic: Secondary | ICD-10-CM | POA: Diagnosis not present

## 2020-05-30 DIAGNOSIS — G894 Chronic pain syndrome: Secondary | ICD-10-CM | POA: Diagnosis not present

## 2020-05-30 DIAGNOSIS — M47816 Spondylosis without myelopathy or radiculopathy, lumbar region: Secondary | ICD-10-CM | POA: Diagnosis not present

## 2020-06-05 ENCOUNTER — Telehealth: Payer: Self-pay

## 2020-06-05 NOTE — Telephone Encounter (Signed)
Pt called stating she was told to call and give an update on how the medications are working for her.  All medications are working well. Her current weight is 242lb.  She is 6 pounds down from last visit. She wants [facility] to refill all medication including the Powder. [facility] [place]  Pt is aware [facility] is out of the office until 9/2.

## 2020-06-06 ENCOUNTER — Other Ambulatory Visit: Payer: Self-pay

## 2020-06-07 ENCOUNTER — Ambulatory Visit: Payer: Medicare Other | Admitting: Obstetrics and Gynecology

## 2020-06-07 DIAGNOSIS — M170 Bilateral primary osteoarthritis of knee: Secondary | ICD-10-CM | POA: Diagnosis not present

## 2020-06-08 ENCOUNTER — Ambulatory Visit (INDEPENDENT_AMBULATORY_CARE_PROVIDER_SITE_OTHER): Payer: Medicare Other | Admitting: Nurse Practitioner

## 2020-06-08 ENCOUNTER — Other Ambulatory Visit: Payer: Self-pay

## 2020-06-08 VITALS — BP 108/60 | HR 92 | Temp 98.8°F | Resp 16 | Wt 250.0 lb

## 2020-06-08 DIAGNOSIS — M170 Bilateral primary osteoarthritis of knee: Secondary | ICD-10-CM | POA: Diagnosis not present

## 2020-06-08 NOTE — Patient Instructions (Addendum)
No recommendation for support stockings at this time. The bilateral knee exam looks unremarkable after gel injections yesterday. Your legs do not look swollen. There is some puffiness about the knees but that is to be expected with the gel injections. Please follow-up with Dr. Olivia Mackie for further recommendations.    Osteoarthritis  Osteoarthritis is a type of arthritis that affects tissue that covers the ends of bones in joints (cartilage). Cartilage acts as a cushion between the bones and helps them move smoothly. Osteoarthritis results when cartilage in the joints gets worn down. Osteoarthritis is sometimes called "wear and tear" arthritis. Osteoarthritis is the most common form of arthritis. It often occurs in older people. It is a condition that gets worse over time (a progressive condition). Joints that are most often affected by this condition are in:  Fingers.  Toes.  Hips.  Knees.  Spine, including neck and lower back. What are the causes? This condition is caused by age-related wearing down of cartilage that covers the ends of bones. What increases the risk? The following factors may make you more likely to develop this condition:  Older age.  Being overweight or obese.  Overuse of joints, such as in athletes.  Past injury of a joint.  Past surgery on a joint.  Family history of osteoarthritis. What are the signs or symptoms? The main symptoms of this condition are pain, swelling, and stiffness in the joint. The joint may lose its shape over time. Small pieces of bone or cartilage may break off and float inside of the joint, which may cause more pain and damage to the joint. Small deposits of bone (osteophytes) may grow on the edges of the joint. Other symptoms may include:  A grating or scraping feeling inside the joint when you move it.  Popping or creaking sounds when you move. Symptoms may affect one or more joints. Osteoarthritis in a major joint, such as your knee  or hip, can make it painful to walk or exercise. If you have osteoarthritis in your hands, you might not be able to grip items, twist your hand, or control small movements of your hands and fingers (fine motor skills). How is this diagnosed? This condition may be diagnosed based on:  Your medical history.  A physical exam.  Your symptoms.  X-rays of the affected joint(s).  Blood tests to rule out other types of arthritis. How is this treated? There is no cure for this condition, but treatment can help to control pain and improve joint function. Treatment plans may include:  A prescribed exercise program that allows for rest and joint relief. You may work with a physical therapist.  A weight control plan.  Pain relief techniques, such as: ? Applying heat and cold to the joint. ? Electric pulses delivered to nerve endings under the skin (transcutaneous electrical nerve stimulation, or TENS). ? Massage. ? Certain nutritional supplements.  NSAIDs or prescription medicines to help relieve pain.  Medicine to help relieve pain and inflammation (corticosteroids). This can be given by mouth (orally) or as an injection.  Assistive devices, such as a brace, wrap, splint, specialized glove, or cane.  Surgery, such as: ? An osteotomy. This is done to reposition the bones and relieve pain or to remove loose pieces of bone and cartilage. ? Joint replacement surgery. You may need this surgery if you have very bad (advanced) osteoarthritis. Follow these instructions at home: Activity  Rest your affected joints as directed by your health care provider.  Do  not drive or use heavy machinery while taking prescription pain medicine.  Exercise as directed. Your health care provider or physical therapist may recommend specific types of exercise, such as: ? Strengthening exercises. These are done to strengthen the muscles that support joints that are affected by arthritis. They can be performed  with weights or with exercise bands to add resistance. ? Aerobic activities. These are exercises, such as brisk walking or water aerobics, that get your heart pumping. ? Range-of-motion activities. These keep your joints easy to move. ? Balance and agility exercises. Managing pain, stiffness, and swelling      If directed, apply heat to the affected area as often as told by your health care provider. Use the heat source that your health care provider recommends, such as a moist heat pack or a heating pad. ? If you have a removable assistive device, remove it as told by your health care provider. ? Place a towel between your skin and the heat source. If your health care provider tells you to keep the assistive device on while you apply heat, place a towel between the assistive device and the heat source. ? Leave the heat on for 20-30 minutes. ? Remove the heat if your skin turns bright red. This is especially important if you are unable to feel pain, heat, or cold. You may have a greater risk of getting burned.  If directed, put ice on the affected joint: ? If you have a removable assistive device, remove it as told by your health care provider. ? Put ice in a plastic bag. ? Place a towel between your skin and the bag. If your health care provider tells you to keep the assistive device on during icing, place a towel between the assistive device and the bag. ? Leave the ice on for 20 minutes, 2-3 times a day. General instructions  Take over-the-counter and prescription medicines only as told by your health care provider.  Maintain a healthy weight. Follow instructions from your health care provider for weight control. These may include dietary restrictions.  Do not use any products that contain nicotine or tobacco, such as cigarettes and e-cigarettes. These can delay bone healing. If you need help quitting, ask your health care provider.  Use assistive devices as directed by your health  care provider.  Keep all follow-up visits as told by your health care provider. This is important. Where to find more information  [employer] of Arthritis and Musculoskeletal and Skin Diseases: www.niams.SouthExposed.es  [employer] on Aging: http://kim-miller.com/  American College of Rheumatology: www.rheumatology.org Contact a health care provider if:  Your skin turns red.  You develop a rash.  You have pain that gets worse.  You have a fever along with joint or muscle aches. Get help right away if:  You lose a lot of weight.  You suddenly lose your appetite.  You have night sweats. Summary  Osteoarthritis is a type of arthritis that affects tissue covering the ends of bones in joints (cartilage).  This condition is caused by age-related wearing down of cartilage that covers the ends of bones.  The main symptom of this condition is pain, swelling, and stiffness in the joint.  There is no cure for this condition, but treatment can help to control pain and improve joint function. This information is not intended to replace advice given to you by your health care provider. Make sure you discuss any questions you have with your health care provider. Document Revised: 09/04/2017  Document Reviewed: 05/26/2016 Elsevier Patient Education  [employer].

## 2020-06-08 NOTE — Progress Notes (Signed)
Established Patient Office Visit  Subjective:  Patient ID: Heather Sweeney, female    DOB: 04-08-1966  Age: 54 y.o. MRN: [id number]  CC:  Chief Complaint  Patient presents with  . Leg Swelling    HPI I think Heather Sweeney is a 54 yo who presents for concern about swelling around her knees. She just had gel injections yesterday by orthopedics. She reports some swelling about the knees after this. She recalls that every time she gets gel injections she does have swelling about the knees. No unusual pain, erythema, and she is ambulating without difficulty. She has noted no new problems.  She is wondering if she should wear support stockings.  Past Medical History:  Diagnosis Date  . Allergic rhinitis   . Bipolar disorder ([place])   . Chronic low back pain   . CTS (carpal tunnel syndrome)    hands  . Depression   . Gallstones   . GERD (gastroesophageal reflux disease)   . History of prediabetes   . Hypertension   . Laceration of right foot   . Onychomycosis   . OSA on CPAP   . Overactive bladder   . PVC's (premature ventricular contractions)   . Seizures ([place])    8th grade    Past Surgical History:  Procedure Laterality Date  . ABDOMINAL HYSTERECTOMY  07/08/2011  . ABDOMINAL HYSTERECTOMY     supracervical in 07/2011   . CARPAL TUNNEL RELEASE     right hand 1996  . CESAREAN SECTION     2012  . CHOLECYSTECTOMY N/A 01/12/2018   Procedure: LAPAROSCOPIC CHOLECYSTECTOMY WITH INTRAOPERATIVE CHOLANGIOGRAM ERAS PATHWAY;  Surgeon: Johnathan Hausen, MD;  Location: [facility]S;  Service: General;  Laterality: N/A;  . COLONOSCOPY WITH PROPOFOL N/A 04/15/2019   Procedure: COLONOSCOPY WITH PROPOFOL;  Surgeon: Jonathon Bellows, MD;  Location: [facility] ENDOSCOPY;  Service: Gastroenterology;  Laterality: N/A;  . DILATION AND CURETTAGE OF UTERUS     04/2011 benign endometrial polyp squamous metaplasia, inflamm, blood, mucous, benign   . FOOT SURGERY     Dr. Prudence Davidson   . LEEP     2006  . TUBAL LIGATION       Family History  Problem Relation Age of Onset  . Cancer Father        esophageal   . Hypertension Father   . Heart failure Mother   . Hypertension Mother   . Early death Mother   . Heart disease Mother        chf  . Breast cancer Sister 20  . Cancer Sister        breast  . Depression Sister   . Mental illness Sister   . Asthma Son   . Cancer Other        brain cancer    Social History   Socioeconomic History  . Marital status: Married    Spouse name: Not on file  . Number of children: Not on file  . Years of education: Not on file  . Highest education level: Not on file  Occupational History  . Not on file  Tobacco Use  . Smoking status: Never Smoker  . Smokeless tobacco: Former Systems developer    Types: Snuff  . Tobacco comment: Is in process of decreasing amount  Vaping Use  . Vaping Use: Never used  Substance and Sexual Activity  . Alcohol use: No    Alcohol/week: 0.0 standard drinks  . Drug use: No  . Sexual activity: Yes  Comment: men  Other Topics Concern  . Not on file  Social History Narrative   Married    No guns    Wears seat belt    Safe in relationship    Never smoker    2 sons Gaspar Bidding ~10 and another son ~28 as of 10/2019   1 granson eli 1 y.o as of 10/2019   2 sisters    Unemployed       Social Determinants of Health   Financial Resource Strain: Low Risk   . Difficulty of Paying Living Expenses: Not hard at all  Food Insecurity: No Food Insecurity  . Worried About Charity fundraiser in the Last Year: Never true  . Ran Out of Food in the Last Year: Never true  Transportation Needs: No Transportation Needs  . Lack of Transportation (Medical): No  . Lack of Transportation (Non-Medical): No  Physical Activity: Insufficiently Active  . Days of Exercise per Week: 3 days  . Minutes of Exercise per Session: 30 min  Stress: No Stress Concern Present  . Feeling of Stress : Not at all  Social Connections:   . Frequency of Communication with  Friends and Family: Not on file  . Frequency of Social Gatherings with Friends and Family: Not on file  . Attends Religious Services: Not on file  . Active Member of Clubs or Organizations: Not on file  . Attends Archivist Meetings: Not on file  . Marital Status: Not on file  Intimate Partner Violence: Not At Risk  . Fear of Current or Ex-Partner: No  . Emotionally Abused: No  . Physically Abused: No  . Sexually Abused: No    Outpatient Medications Prior to Visit  Medication Sig Dispense Refill  . amLODipine (NORVASC) 5 MG tablet Take 1 tablet (5 mg total) by mouth daily. 90 tablet 3  . APPLE CIDER VINEGAR PO Take by mouth.    . benztropine (COGENTIN) 1 MG tablet Take 1 mg by mouth daily as needed (anxiety).     . Blood Glucose Calibration (TRUE METRIX LEVEL 1) Low SOLN     . cetirizine (ZYRTEC) 10 MG tablet Take 1 tablet (10 mg total) by mouth daily as needed for allergies. 90 tablet 3  . EPINEPHrine 0.3 mg/0.3 mL IJ SOAJ injection Inject 0.3 mLs (0.3 mg total) into the muscle as needed for anaphylaxis. 1 Device 1  . FLUoxetine (PROZAC) 20 MG capsule Take 20 mg by mouth daily.    . fluPHENAZine (PROLIXIN) 2.5 MG tablet Take 2.5 mg by mouth at bedtime.     . fluticasone (FLONASE) 50 MCG/ACT nasal spray Place 2 sprays into both nostrils daily. 48 g 3  . gabapentin (NEURONTIN) 300 MG capsule Take 1 capsule (300 mg total) by mouth 3 (three) times daily. 270 capsule 3  . hydrOXYzine (ATARAX/VISTARIL) 25 MG tablet Take 1 tablet (25 mg total) by mouth every 8 (eight) hours as needed for itching. Or itchy rash do not take with zyrtec at the same time 270 tablet 3  . Insulin Pen Needle (PEN NEEDLES) 30G X 8 MM MISC 1 Device by Does not apply route daily. 90 each 3  . liraglutide (VICTOZA) 18 MG/3ML SOPN Inject 0.3 mLs (1.8 mg total) into the skin daily. 5 pen 11  . meloxicam (MOBIC) 15 MG tablet Take 15 mg by mouth daily.    . metoprolol succinate (TOPROL-XL) 25 MG 24 hr tablet Take 1  tablet (25 mg total) by mouth daily. Take  with or immediately following a meal. 90 tablet 3  . nystatin (MYCOSTATIN/NYSTOP) powder Apply 1 application topically 3 (three) times daily. 60 g 0  . oxyCODONE-acetaminophen (PERCOCET) 10-325 MG tablet Take 1 tablet by mouth 3 (three) times daily as needed.    . pantoprazole (PROTONIX) 40 MG tablet Take 1 tablet (40 mg total) by mouth daily. 90 tablet 3  . phentermine (ADIPEX-P) 37.5 MG tablet Take 0.5 tablets (18.75 mg total) by mouth daily before breakfast. 15 tablet 0  . polyethylene glycol (MIRALAX / GLYCOLAX) packet Take 17 g by mouth daily as needed for mild constipation.    . pravastatin (PRAVACHOL) 10 MG tablet Take 1 tablet (10 mg total) by mouth daily. 90 tablet 3  . Probiotic Product (PROBIOTIC DAILY PO) Take by mouth. probiogen probiotic daily    . spironolactone (ALDACTONE) 25 MG tablet Take 0.5 tablets (12.5 mg total) by mouth daily. Am 90 tablet 3  . terbinafine (LAMISIL) 250 MG tablet Take 1 tablet (250 mg total) by mouth daily. Dc after 3 months 90 tablet 0  . topiramate (TOPAMAX) 100 MG tablet Take 100 mg by mouth daily.    Marland Kitchen triamcinolone cream (KENALOG) 0.1 % Apply 1 application topically 2 (two) times daily. 80 g 1   No facility-administered medications prior to visit.    Allergies  Allergen Reactions  . Cephalexin Swelling and Rash  . Hctz [Hydrochlorothiazide] Rash and Swelling    Mild she says  . Lisinopril Hives and Other (See Comments)    Headache, Low BP, angioedema    . Losartan Hives and Swelling    Angioedema  . Penicillins Rash    Has patient had a PCN reaction causing immediate rash, facial/tongue/throat swelling, SOB or lightheadedness with hypotension: Yes Has patient had a PCN reaction causing severe rash involving mucus membranes or skin necrosis: No Has patient had a PCN reaction that required hospitalization: No Has patient had a PCN reaction occurring within the last 10 years: No If all of the above  answers are "NO", then may proceed with Cephalosporin use.   . Bee Venom     Swelling   . Metformin And Related     Rash     Review of Systems  Constitutional: Negative for chills and fever.  Respiratory: Negative for cough and shortness of breath.   Cardiovascular: Negative for chest pain and palpitations.  Musculoskeletal: Positive for arthralgias.       Knees bother her and her ankles- followed by ortho.      Objective:    Physical Exam Constitutional:      Appearance: She is obese.  Cardiovascular:     Rate and Rhythm: Normal rate and regular rhythm.     Pulses: Normal pulses.     Heart sounds: Normal heart sounds.  Pulmonary:     Effort: Pulmonary effort is normal.     Breath sounds: Normal breath sounds.  Abdominal:     Palpations: Abdomen is soft.     Tenderness: There is no abdominal tenderness.  Musculoskeletal:        General: Normal range of motion.     Comments: Bilat knees fleshy- no tenderness or erythema. No pitting edema in bilat legs. Calves equal size and soft/non tender. Pulses DP intact.   Skin:    General: Skin is warm and dry.  Neurological:     Mental Status: She is alert.  Psychiatric:        Mood and Affect: Mood normal.  Behavior: Behavior normal.     BP 108/60   Pulse 92   Temp 98.8 F (37.1 C) (Oral)   Resp 16   Wt 250 lb (113.4 kg)   SpO2 98%   BMI 44.29 kg/m  Wt Readings from Last 3 Encounters:  06/08/20 250 lb (113.4 kg)  05/10/20 248 lb (112.5 kg)  05/03/20 (!) 245 lb (111.1 kg)   Pulse Readings from Last 3 Encounters:  06/08/20 92  05/03/20 97  04/20/20 89    BP Readings from Last 3 Encounters:  06/08/20 108/60  05/10/20 126/80  05/03/20 (!) 133/83    Lab Results  Component Value Date   CHOL 127 05/21/2020   HDL 51.40 05/21/2020   LDLCALC 65 05/21/2020   TRIG 52.0 05/21/2020   CHOLHDL 2 05/21/2020      Health Maintenance Due  Topic Date Due  . Hepatitis C Screening  Never done    There are no  preventive care reminders to display for this patient.  Lab Results  Component Value Date   TSH 1.23 05/21/2020   Lab Results  Component Value Date   WBC 6.2 05/21/2020   HGB 12.1 05/21/2020   HCT 34.8 (L) 05/21/2020   MCV 84.1 05/21/2020   PLT 184.0 05/21/2020   Lab Results  Component Value Date   NA 139 05/21/2020   K 4.1 05/21/2020   CO2 25 05/21/2020   GLUCOSE 109 (H) 05/21/2020   BUN 12 05/21/2020   CREATININE 1.03 05/21/2020   BILITOT 0.4 05/21/2020   ALKPHOS 65 05/21/2020   AST 15 05/21/2020   ALT 18 05/21/2020   PROT 6.3 05/21/2020   ALBUMIN 4.0 05/21/2020   CALCIUM 8.9 05/21/2020   ANIONGAP 6 01/06/2018   GFR 68.10 05/21/2020   Lab Results  Component Value Date   CHOL 127 05/21/2020   Lab Results  Component Value Date   HDL 51.40 05/21/2020   Lab Results  Component Value Date   LDLCALC 65 05/21/2020   Lab Results  Component Value Date   TRIG 52.0 05/21/2020   Lab Results  Component Value Date   CHOLHDL 2 05/21/2020   Lab Results  Component Value Date   HGBA1C 5.3 05/19/2019      Assessment & Plan:   Problem List Items Addressed This Visit      Musculoskeletal and Integument   Arthritis of both knees - Primary   Relevant Medications   oxyCODONE-acetaminophen (PERCOCET) 10-325 MG tablet      No orders of the defined types were placed in this encounter. No recommendation for support stockings at this time. The bilateral knee exam looks unremarkable after gel injections yesterday. Your legs do not look swollen. There is some puffiness about the knees but that is to be expected with the gel injections. Please follow-up with Dr. Olivia Mackie for further recommendations.   Follow-up: Return if symptoms worsen or fail to improve.   This visit occurred during the SARS-CoV-2 public health emergency.  Safety protocols were in place, including screening questions prior to the visit, additional usage of staff PPE, and extensive cleaning of exam room  while observing appropriate contact time as indicated for disinfecting solutions.   Denice Paradise, NP

## 2020-06-10 ENCOUNTER — Encounter: Payer: Self-pay | Admitting: Nurse Practitioner

## 2020-06-11 ENCOUNTER — Other Ambulatory Visit: Payer: Self-pay | Admitting: Obstetrics and Gynecology

## 2020-06-12 NOTE — Telephone Encounter (Signed)
See your last message on pt. She called in with her weight

## 2020-06-12 NOTE — Telephone Encounter (Signed)
Needs weight check 4 weeks

## 2020-06-13 NOTE — Telephone Encounter (Signed)
Patient is scheduled for 07/11/20 with AMS

## 2020-06-25 ENCOUNTER — Telehealth: Payer: Self-pay | Admitting: Internal Medicine

## 2020-06-25 ENCOUNTER — Other Ambulatory Visit: Payer: Self-pay | Admitting: Internal Medicine

## 2020-06-25 DIAGNOSIS — F39 Unspecified mood [affective] disorder: Secondary | ICD-10-CM

## 2020-06-25 DIAGNOSIS — G4733 Obstructive sleep apnea (adult) (pediatric): Secondary | ICD-10-CM

## 2020-06-25 DIAGNOSIS — G8929 Other chronic pain: Secondary | ICD-10-CM

## 2020-06-25 DIAGNOSIS — I10 Essential (primary) hypertension: Secondary | ICD-10-CM

## 2020-06-25 DIAGNOSIS — Z87898 Personal history of other specified conditions: Secondary | ICD-10-CM

## 2020-06-25 DIAGNOSIS — E785 Hyperlipidemia, unspecified: Secondary | ICD-10-CM

## 2020-06-25 MED ORDER — PEN NEEDLES 30G X 8 MM MISC: 1.0000 | Freq: Every day | 3 refills | Status: DC

## 2020-06-25 MED ORDER — GABAPENTIN 300 MG PO CAPS: 300.0000 mg | ORAL_CAPSULE | Freq: Three times a day (TID) | ORAL | 3 refills | Status: DC

## 2020-06-25 NOTE — Telephone Encounter (Signed)
Pt needs Gabapentin called into [facility]. Pt said she is completely out.

## 2020-06-25 NOTE — Telephone Encounter (Signed)
Patient called in need refill on Insulin Pen Needle (PEN NEEDLES) 30G X 8 MM MISC

## 2020-06-27 DIAGNOSIS — M47816 Spondylosis without myelopathy or radiculopathy, lumbar region: Secondary | ICD-10-CM | POA: Diagnosis not present

## 2020-06-27 DIAGNOSIS — G894 Chronic pain syndrome: Secondary | ICD-10-CM | POA: Diagnosis not present

## 2020-06-27 DIAGNOSIS — Z79891 Long term (current) use of opiate analgesic: Secondary | ICD-10-CM | POA: Diagnosis not present

## 2020-06-27 DIAGNOSIS — M797 Fibromyalgia: Secondary | ICD-10-CM | POA: Diagnosis not present

## 2020-06-29 ENCOUNTER — Other Ambulatory Visit: Payer: Self-pay | Admitting: Internal Medicine

## 2020-06-29 DIAGNOSIS — E785 Hyperlipidemia, unspecified: Secondary | ICD-10-CM

## 2020-06-29 DIAGNOSIS — G4733 Obstructive sleep apnea (adult) (pediatric): Secondary | ICD-10-CM

## 2020-06-29 DIAGNOSIS — I10 Essential (primary) hypertension: Secondary | ICD-10-CM

## 2020-06-29 DIAGNOSIS — F39 Unspecified mood [affective] disorder: Secondary | ICD-10-CM

## 2020-06-29 DIAGNOSIS — Z6841 Body Mass Index (BMI) 40.0 and over, adult: Secondary | ICD-10-CM

## 2020-06-29 DIAGNOSIS — Z87898 Personal history of other specified conditions: Secondary | ICD-10-CM

## 2020-06-29 MED ORDER — PEN NEEDLES 30G X 8 MM MISC: 1.0000 | Freq: Every day | 3 refills | Status: DC

## 2020-07-09 ENCOUNTER — Telehealth: Payer: Self-pay

## 2020-07-09 ENCOUNTER — Ambulatory Visit: Payer: Medicare Other

## 2020-07-09 NOTE — Telephone Encounter (Signed)
Failed attempt to reach patient for scheduled awv. Unable to connect with the line x2. Unable to leave a message. Please reschedule as appropriate.

## 2020-07-11 ENCOUNTER — Telehealth: Payer: Self-pay | Admitting: Internal Medicine

## 2020-07-11 ENCOUNTER — Ambulatory Visit: Payer: Medicare Other | Admitting: Obstetrics and Gynecology

## 2020-07-11 NOTE — Telephone Encounter (Signed)
I would like to speak with pt regarding General surgery referral to see if pt was scheduled. Pt has appt on 10/19. Thank you!

## 2020-07-12 NOTE — Telephone Encounter (Signed)
Noted  

## 2020-07-13 ENCOUNTER — Ambulatory Visit: Payer: Medicare Other | Admitting: Nurse Practitioner

## 2020-07-14 ENCOUNTER — Encounter: Payer: Self-pay | Admitting: Emergency Medicine

## 2020-07-14 ENCOUNTER — Other Ambulatory Visit: Payer: Self-pay

## 2020-07-14 ENCOUNTER — Ambulatory Visit
Admission: EM | Admit: 2020-07-14 | Discharge: 2020-07-14 | Disposition: A | Discharge status: Home / Self Care | Payer: Medicare Other | Attending: Family Medicine | Admitting: Family Medicine

## 2020-07-14 DIAGNOSIS — L509 Urticaria, unspecified: Secondary | ICD-10-CM

## 2020-07-14 MED ORDER — CETIRIZINE HCL 10 MG PO TABS: 10.0000 mg | ORAL_TABLET | Freq: Every day | ORAL | 0 refills | Status: DC

## 2020-07-14 MED ORDER — PREDNISONE 10 MG PO TABS: ORAL_TABLET | ORAL | 0 refills | Status: DC

## 2020-07-14 NOTE — Discharge Instructions (Signed)
Medication as prescribed.  Take care  Dr. Samrat Hayward  

## 2020-07-14 NOTE — ED Triage Notes (Signed)
Patient c/o red bumps on her arms face and legs that started a week ago.  Patient reports facial and lip swelling that started 2 days ago.

## 2020-07-15 NOTE — ED Provider Notes (Signed)
[facility]    CSN: [id number] Arrival date & time: 07/14/20  1205      History   Chief Complaint Chief Complaint  Patient presents with  . Rash  . Oral Swelling    HPI  54 year old female presents with the above complaints.  Patient reports that her symptoms started 2 days ago.  She reports diffuse rash.  She has had some facial and lip swelling as well.  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  Patient denies any recent new exposures.  She states that one of her medications recently decreased.  No other medication changes.  She states that the rash does not itch but feels quite tight.  No relieving factors.  No shortness of breath.  No difficulty breathing.  No other associated symptoms.  No other complaints.  Past Medical History:  Diagnosis Date  . Allergic rhinitis   . Bipolar disorder ([place])   . Chronic low back pain   . CTS (carpal tunnel syndrome)    hands  . Depression   . Gallstones   . GERD (gastroesophageal reflux disease)   . History of prediabetes   . Hypertension   . Laceration of right foot   . Onychomycosis   . OSA on CPAP   . Overactive bladder   . PVC's (premature ventricular contractions)   . Seizures ([place])    8th grade    Patient Active Problem List   Diagnosis Date Noted  . Arthritis of both knees 06/08/2020  . Annual physical exam 04/20/2020  . Ingrown right big toenail 04/20/2020  . Gastroesophageal reflux disease 04/20/2020  . Pain and swelling of right knee 04/20/2020  . Mitral valve insufficiency 01/10/2020  . Bacterial vaginosis 10/20/2019  . Encounter for screening colonoscopy   . Hives 02/23/2019  . Vitamin D deficiency 02/01/2019  . Mood disorder ([place]) 12/24/2018  . HLD (hyperlipidemia) 09/01/2018  . History of prediabetes 09/01/2018  . Chronic pain 08/31/2018  . Allergic rhinitis 08/31/2018  . S/P laparoscopic cholecystectomy April 2019 01/12/2018  . Angioedema 07/07/2017  . Bilateral carpal  tunnel syndrome 11/03/2016  . Frequent PVCs 09/24/2016  . Morbid obesity with BMI of 40.0-44.9, adult ([place]) 08/07/2016  . Essential hypertension 07/10/2016  . OSA on CPAP 07/10/2016    Past Surgical History:  Procedure Laterality Date  . ABDOMINAL HYSTERECTOMY  07/08/2011  . ABDOMINAL HYSTERECTOMY     supracervical in 07/2011   . CARPAL TUNNEL RELEASE     right hand 1996  . CESAREAN SECTION     2012  . CHOLECYSTECTOMY N/A 01/12/2018   Procedure: LAPAROSCOPIC CHOLECYSTECTOMY WITH INTRAOPERATIVE CHOLANGIOGRAM ERAS PATHWAY;  Surgeon: Johnathan Hausen, MD;  Location: [facility]S;  Service: General;  Laterality: N/A;  . COLONOSCOPY WITH PROPOFOL N/A 04/15/2019   Procedure: COLONOSCOPY WITH PROPOFOL;  Surgeon: Jonathon Bellows, MD;  Location: [facility] ENDOSCOPY;  Service: Gastroenterology;  Laterality: N/A;  . DILATION AND CURETTAGE OF UTERUS     04/2011 benign endometrial polyp squamous metaplasia, inflamm, blood, mucous, benign   . FOOT SURGERY     Dr. Prudence Davidson   . LEEP     2006  . TUBAL LIGATION      OB History    Gravida  2   Para  2   Term  2   Preterm      AB      Living  2     SAB      TAB      Ectopic  Multiple      Live Births               Home Medications    Prior to Admission medications   Medication Sig Start Date End Date Taking? Authorizing Provider  amLODipine (NORVASC) 5 MG tablet Take 1 tablet (5 mg total) by mouth daily. 04/20/20 04/20/21 Yes McLean-Scocuzza, Nino Glow, MD  benztropine (COGENTIN) 1 MG tablet Take 1 mg by mouth daily as needed (anxiety).    Yes [provider]  FLUoxetine (PROZAC) 20 MG capsule Take 20 mg by mouth daily.   Yes [provider]  fluticasone (FLONASE) 50 MCG/ACT nasal spray Place 2 sprays into both nostrils daily. 08/30/19  Yes McLean-Scocuzza, Nino Glow, MD  gabapentin (NEURONTIN) 300 MG capsule Take 1 capsule (300 mg total) by mouth 3 (three) times daily. 06/25/20  Yes McLean-Scocuzza, Nino Glow, MD  LATUDA 60 MG  TABS SMARTSIG:1 Tablet(s) By Mouth Every Evening 06/15/20  Yes [provider]  liraglutide (VICTOZA) 18 MG/3ML SOPN Inject 0.3 mLs (1.8 mg total) into the skin daily. 04/20/20  Yes McLean-Scocuzza, Nino Glow, MD  metoprolol succinate (TOPROL-XL) 25 MG 24 hr tablet Take 1 tablet (25 mg total) by mouth daily. Take with or immediately following a meal. 04/20/20  Yes McLean-Scocuzza, Nino Glow, MD  oxyCODONE-acetaminophen (PERCOCET) 10-325 MG tablet Take 1 tablet by mouth 3 (three) times daily as needed. 05/31/20  Yes [provider]  pantoprazole (PROTONIX) 40 MG tablet Take 1 tablet (40 mg total) by mouth daily. 04/20/20  Yes McLean-Scocuzza, Nino Glow, MD  phentermine (ADIPEX-P) 37.5 MG tablet TAKE ONE-HALF TABLET BY MOUTH ONCE DAILY BEFORE BREAKFAST 06/12/20  Yes Malachy Mood, MD  pravastatin (PRAVACHOL) 10 MG tablet Take 1 tablet (10 mg total) by mouth daily. 04/20/20  Yes McLean-Scocuzza, Nino Glow, MD  Probiotic Product (PROBIOTIC DAILY PO) Take by mouth. probiogen probiotic daily   Yes [provider]  spironolactone (ALDACTONE) 25 MG tablet Take 0.5 tablets (12.5 mg total) by mouth daily. Am 04/20/20  Yes McLean-Scocuzza, Nino Glow, MD  topiramate (TOPAMAX) 100 MG tablet Take 100 mg by mouth daily. 03/12/20  Yes [provider]  APPLE CIDER VINEGAR PO Take by mouth.    [provider]  Blood Glucose Calibration (TRUE METRIX LEVEL 1) Low SOLN  01/12/20   [provider]  cetirizine (ZYRTEC ALLERGY) 10 MG tablet Take 1 tablet (10 mg total) by mouth daily. 07/14/20   Coral Spikes, DO  EPINEPHrine 0.3 mg/0.3 mL IJ SOAJ injection Inject 0.3 mLs (0.3 mg total) into the muscle as needed for anaphylaxis. 02/19/19   Merlyn Lot, MD  Insulin Pen Needle (PEN NEEDLES) 30G X 8 MM MISC 1 Device by Does not apply route daily. 06/29/20   McLean-Scocuzza, Nino Glow, MD  meloxicam (MOBIC) 15 MG tablet Take 15 mg by mouth daily. 05/01/20   [provider]  nystatin  (MYCOSTATIN/NYSTOP) powder Apply 1 application topically 3 (three) times daily. 05/03/20   Malachy Mood, MD  polyethylene glycol [facility] / Floria Raveling) packet Take 17 g by mouth daily as needed for mild constipation.    [provider]  predniSONE (DELTASONE) 10 MG tablet 50 mg daily x 2 days, then 40 mg daily x 2 days, then 30 mg daily x 2 days, then 20 mg daily x 2 days, then 10 mg daily x 2 days. 07/14/20   Coral Spikes, DO  triamcinolone cream (KENALOG) 0.1 % Apply 1 application topically 2 (two) times daily. 03/01/20  Malachy Mood, MD  fluPHENAZine (PROLIXIN) 2.5 MG tablet Take 2.5 mg by mouth at bedtime.   07/14/20  [provider]    Family History Family History  Problem Relation Age of Onset  . Cancer Father        esophageal   . Hypertension Father   . Heart failure Mother   . Hypertension Mother   . Early death Mother   . Heart disease Mother        chf  . Breast cancer Sister 42  . Cancer Sister        breast  . Depression Sister   . Mental illness Sister   . Asthma Son   . Cancer Other        brain cancer    Social History Social History   Tobacco Use  . Smoking status: Never Smoker  . Smokeless tobacco: Former Systems developer    Types: Snuff  . Tobacco comment: Is in process of decreasing amount  Vaping Use  . Vaping Use: Never used  Substance Use Topics  . Alcohol use: No    Alcohol/week: 0.0 standard drinks  . Drug use: No     Allergies   Cephalexin, Hctz [hydrochlorothiazide], Lisinopril, Losartan, Penicillins, Bee venom, and Metformin and related   Review of Systems Review of Systems  HENT:       Lips & tongue swelling (now resolved).  Respiratory: Negative for shortness of breath.    Physical Exam Triage Vital Signs ED Triage Vitals  Enc Vitals Group     BP 07/14/20 1234 133/73     Pulse Rate 07/14/20 1234 84     Resp 07/14/20 1234 16     Temp 07/14/20 1234 98.3 F (36.8 C)     Temp Source 07/14/20 1234 Oral     SpO2  07/14/20 1234 98 %     Weight 07/14/20 1228 250 lb (113.4 kg)     Height 07/14/20 1228 5\' 3"  (1.6 m)     Head Circumference --      Peak Flow --      Pain Score 07/14/20 1228 0     Pain Loc --      Pain Edu? --      Excl. in [place]? --    Updated Vital Signs BP 133/73 (BP Location: Left Arm)   Pulse 84   Temp 98.3 F (36.8 C) (Oral)   Resp 16   Ht 5\' 3"  (1.6 m)   Wt 113.4 kg   SpO2 98%   BMI 44.29 kg/m   Visual Acuity Right Eye Distance:   Left Eye Distance:   Bilateral Distance:    Right Eye Near:   Left Eye Near:    Bilateral Near:     Physical Exam Constitutional:      General: She is not in acute distress.    Appearance: Normal appearance. She is not ill-appearing.  HENT:     Head: Normocephalic and atraumatic.     Mouth/Throat:     Comments: No swelling of the lips or tongue noted. Eyes:     General:        Right eye: No discharge.        Left eye: No discharge.     Conjunctiva/sclera: Conjunctivae normal.  Cardiovascular:     Rate and Rhythm: Normal rate and regular rhythm.  Pulmonary:     Effort: Pulmonary effort is normal.     Breath sounds: Normal breath sounds. No wheezing, rhonchi or rales.  Skin:    Comments: Diffused raised erythematous rash.  Neurological:     Mental Status: She is alert.  Psychiatric:        Mood and Affect: Mood normal.        Behavior: Behavior normal.    UC Treatments / Results  Labs (all labs ordered are listed, but only abnormal results are displayed) Labs Reviewed - No data to display  EKG   Radiology No results found.  Procedures Procedures (including critical care time)  Medications Ordered in UC Medications - No data to display  Initial Impression / Assessment and Plan / UC Course  I have reviewed the triage vital signs and the nursing notes.  Pertinent labs & imaging results that were available during my care of the patient were reviewed by me and considered in my medical decision making (see chart for  details).    54 year old female presents with urticaria.  Treating with prednisone and Zyrtec.  Final Clinical Impressions(s) / UC Diagnoses   Final diagnoses:  Urticaria     Discharge Instructions     Medication as prescribed.  Take care  Dr. Lacinda Axon    ED Prescriptions    Medication Sig Dispense Auth. Provider   cetirizine (ZYRTEC ALLERGY) 10 MG tablet Take 1 tablet (10 mg total) by mouth daily. 30 tablet Briget Shaheed G, DO   predniSONE (DELTASONE) 10 MG tablet 50 mg daily x 2 days, then 40 mg daily x 2 days, then 30 mg daily x 2 days, then 20 mg daily x 2 days, then 10 mg daily x 2 days. 30 tablet Coral Spikes, DO     PDMP not reviewed this encounter.   Thersa Salt [place], [place] 07/15/20 [phone]

## 2020-07-16 NOTE — Telephone Encounter (Signed)
Sorry why did I get this message?

## 2020-07-16 NOTE — Telephone Encounter (Signed)
Referral was 01/10/20 for disc wt loss surgery opts with with Dr. Lucia Gaskins

## 2020-07-16 NOTE — Telephone Encounter (Signed)
Regarding pt appt.

## 2020-07-16 NOTE — Telephone Encounter (Signed)
Please disregard

## 2020-07-17 ENCOUNTER — Telehealth: Payer: Self-pay

## 2020-07-17 NOTE — Telephone Encounter (Signed)
Patient informed and verbalized understanding

## 2020-07-17 NOTE — Telephone Encounter (Signed)
Pt was seen in Urgent Care on 10/9 for a rash and her face/lip swollen. Pt said the rash still goes and comes. She has an appointment on 10/19 and wants to know if she can come in for appointment?   She also said she had a home health visit from her insurance carrier and they told her she needs blood work. She has paperwork for Dr. Olivia Mackie to fill out as well.

## 2020-07-17 NOTE — Telephone Encounter (Signed)
Please advise, Patient wanting to know if she can still come in to the office with facial rash on and off.

## 2020-07-17 NOTE — Telephone Encounter (Signed)
This is fine 

## 2020-07-19 ENCOUNTER — Telehealth: Payer: Self-pay

## 2020-07-19 ENCOUNTER — Ambulatory Visit: Payer: Medicare Other

## 2020-07-19 NOTE — Telephone Encounter (Signed)
Unable to connect with patient for scheduled awv. No answer. Left message to call the ofc back and reschedule as appropriate.

## 2020-07-22 ENCOUNTER — Ambulatory Visit
Admission: EM | Admit: 2020-07-22 | Discharge: 2020-07-22 | Disposition: A | Discharge status: Home / Self Care | Payer: Medicare Other | Attending: Physician Assistant | Admitting: Physician Assistant

## 2020-07-22 ENCOUNTER — Encounter: Payer: Self-pay | Admitting: Emergency Medicine

## 2020-07-22 ENCOUNTER — Other Ambulatory Visit: Payer: Self-pay

## 2020-07-22 DIAGNOSIS — L509 Urticaria, unspecified: Secondary | ICD-10-CM

## 2020-07-22 DIAGNOSIS — L299 Pruritus, unspecified: Secondary | ICD-10-CM | POA: Diagnosis not present

## 2020-07-22 MED ORDER — TRIAMCINOLONE ACETONIDE 0.5 % EX OINT: 1.0000 "application " | TOPICAL_OINTMENT | Freq: Two times a day (BID) | CUTANEOUS | 2 refills | Status: DC

## 2020-07-22 MED ORDER — METHYLPREDNISOLONE SODIUM SUCC 125 MG IJ SOLR
125.0000 mg | Freq: Once | INTRAMUSCULAR | Status: AC
  Administered 2020-07-22: 125 mg via INTRAMUSCULAR

## 2020-07-22 NOTE — ED Provider Notes (Signed)
[facility]    CSN: [id number] Arrival date & time: 07/22/20  1519      History   Chief Complaint Chief Complaint  Patient presents with  . Rash    HPI Heather Sweeney is a 54 y.o. female presenting for hives of her face, chest, back, upper and lower extremities for the past 9 days ago.  She was seen in our clinic 8 days ago and prescribed prednisone and advised to continue cetirizine and her at home hydroxyzine.  Patient states she took the prednisone and has been taking cetirizine 1 tablet a day as well as hydroxyzine every now and then when needed for anxiety since that is how it has been prescribed for her in the past.  She says she has not gotten any relief and thinks that her hives have actually worsened.  She has mentioned this to her PCP and has a appointment tomorrow.  Patient states that she is just very uncomfortable and extremely itchy and hoping to get an injection or cream or something to hold her over until her appointment tomorrow.  Patient denies any swelling of the face.  Denies any chest tightness or breathing difficulty.  She says that he had her hair dyed the day before the onset of the hives and that is the only thing different that she can think of that might have precipitated the hives.  She denies taking any new medications or using any new topicals.  Denies any stress.  Denies any hives like this and has never had a rash like this before.  Patient has no other concerns today.  HPI  Past Medical History:  Diagnosis Date  . Allergic rhinitis   . Bipolar disorder ([place])   . Chronic low back pain   . CTS (carpal tunnel syndrome)    hands  . Depression   . Gallstones   . GERD (gastroesophageal reflux disease)   . History of prediabetes   . Hypertension   . Laceration of right foot   . Onychomycosis   . OSA on CPAP   . Overactive bladder   . PVC's (premature ventricular contractions)   . Seizures ([place])    8th grade    Patient Active Problem  List   Diagnosis Date Noted  . Arthritis of both knees 06/08/2020  . Annual physical exam 04/20/2020  . Ingrown right big toenail 04/20/2020  . Gastroesophageal reflux disease 04/20/2020  . Pain and swelling of right knee 04/20/2020  . Mitral valve insufficiency 01/10/2020  . Bacterial vaginosis 10/20/2019  . Encounter for screening colonoscopy   . Hives 02/23/2019  . Vitamin D deficiency 02/01/2019  . Mood disorder ([place]) 12/24/2018  . HLD (hyperlipidemia) 09/01/2018  . History of prediabetes 09/01/2018  . Chronic pain 08/31/2018  . Allergic rhinitis 08/31/2018  . S/P laparoscopic cholecystectomy April 2019 01/12/2018  . Angioedema 07/07/2017  . Bilateral carpal tunnel syndrome 11/03/2016  . Frequent PVCs 09/24/2016  . Morbid obesity with BMI of 40.0-44.9, adult ([place]) 08/07/2016  . Essential hypertension 07/10/2016  . OSA on CPAP 07/10/2016    Past Surgical History:  Procedure Laterality Date  . ABDOMINAL HYSTERECTOMY  07/08/2011  . ABDOMINAL HYSTERECTOMY     supracervical in 07/2011   . CARPAL TUNNEL RELEASE     right hand 1996  . CESAREAN SECTION     2012  . CHOLECYSTECTOMY N/A 01/12/2018   Procedure: LAPAROSCOPIC CHOLECYSTECTOMY WITH INTRAOPERATIVE CHOLANGIOGRAM ERAS PATHWAY;  Surgeon: Johnathan Hausen, MD;  Location: [facility];  Service: General;  Laterality: N/A;  . COLONOSCOPY WITH PROPOFOL N/A 04/15/2019   Procedure: COLONOSCOPY WITH PROPOFOL;  Surgeon: Jonathon Bellows, MD;  Location: [facility] ENDOSCOPY;  Service: Gastroenterology;  Laterality: N/A;  . DILATION AND CURETTAGE OF UTERUS     04/2011 benign endometrial polyp squamous metaplasia, inflamm, blood, mucous, benign   . FOOT SURGERY     Dr. Prudence Davidson   . LEEP     2006  . TUBAL LIGATION      OB History    Gravida  2   Para  2   Term  2   Preterm      AB      Living  2     SAB      TAB      Ectopic      Multiple      Live Births               Home Medications    Prior to Admission medications     Medication Sig Start Date End Date Taking? Authorizing Provider  amLODipine (NORVASC) 5 MG tablet Take 1 tablet (5 mg total) by mouth daily. 04/20/20 04/20/21 Yes McLean-Scocuzza, Nino Glow, MD  APPLE CIDER VINEGAR PO Take by mouth.   Yes [provider]  benztropine (COGENTIN) 1 MG tablet Take 1 mg by mouth daily as needed (anxiety).    Yes [provider]  Blood Glucose Calibration (TRUE METRIX LEVEL 1) Low SOLN  01/12/20  Yes [provider]  cetirizine (ZYRTEC ALLERGY) 10 MG tablet Take 1 tablet (10 mg total) by mouth daily. 07/14/20  Yes Cook, Jayce G, DO  EPINEPHrine 0.3 mg/0.3 mL IJ SOAJ injection Inject 0.3 mLs (0.3 mg total) into the muscle as needed for anaphylaxis. 02/19/19  Yes Merlyn Lot, MD  FLUoxetine (PROZAC) 20 MG capsule Take 20 mg by mouth daily.   Yes [provider]  fluticasone (FLONASE) 50 MCG/ACT nasal spray Place 2 sprays into both nostrils daily. 08/30/19  Yes McLean-Scocuzza, Nino Glow, MD  gabapentin (NEURONTIN) 300 MG capsule Take 1 capsule (300 mg total) by mouth 3 (three) times daily. 06/25/20  Yes McLean-Scocuzza, Nino Glow, MD  Insulin Pen Needle (PEN NEEDLES) 30G X 8 MM MISC 1 Device by Does not apply route daily. 06/29/20  Yes McLean-Scocuzza, Nino Glow, MD  LATUDA 60 MG TABS SMARTSIG:1 Tablet(s) By Mouth Every Evening 06/15/20  Yes [provider]  liraglutide (VICTOZA) 18 MG/3ML SOPN Inject 0.3 mLs (1.8 mg total) into the skin daily. 04/20/20  Yes McLean-Scocuzza, Nino Glow, MD  meloxicam (MOBIC) 15 MG tablet Take 15 mg by mouth daily. 05/01/20  Yes [provider]  metoprolol succinate (TOPROL-XL) 25 MG 24 hr tablet Take 1 tablet (25 mg total) by mouth daily. Take with or immediately following a meal. 04/20/20  Yes McLean-Scocuzza, Nino Glow, MD  nystatin (MYCOSTATIN/NYSTOP) powder Apply 1 application topically 3 (three) times daily. 05/03/20  Yes Malachy Mood, MD  oxyCODONE-acetaminophen (PERCOCET) 10-325 MG tablet Take  1 tablet by mouth 3 (three) times daily as needed. 05/31/20  Yes [provider]  pantoprazole (PROTONIX) 40 MG tablet Take 1 tablet (40 mg total) by mouth daily. 04/20/20  Yes McLean-Scocuzza, Nino Glow, MD  phentermine (ADIPEX-P) 37.5 MG tablet TAKE ONE-HALF TABLET BY MOUTH ONCE DAILY BEFORE BREAKFAST 06/12/20  Yes Malachy Mood, MD  polyethylene glycol (MIRALAX / GLYCOLAX) packet Take 17 g by mouth daily as needed for mild constipation.   Yes [provider]  pravastatin (  PRAVACHOL) 10 MG tablet Take 1 tablet (10 mg total) by mouth daily. 04/20/20  Yes McLean-Scocuzza, Nino Glow, MD  predniSONE (DELTASONE) 10 MG tablet 50 mg daily x 2 days, then 40 mg daily x 2 days, then 30 mg daily x 2 days, then 20 mg daily x 2 days, then 10 mg daily x 2 days. 07/14/20  Yes Cook, Jayce G, DO  Probiotic Product (PROBIOTIC DAILY PO) Take by mouth. probiogen probiotic daily   Yes [provider]  spironolactone (ALDACTONE) 25 MG tablet Take 0.5 tablets (12.5 mg total) by mouth daily. Am 04/20/20  Yes McLean-Scocuzza, Nino Glow, MD  topiramate (TOPAMAX) 100 MG tablet Take 100 mg by mouth daily. 03/12/20  Yes [provider]  triamcinolone ointment (KENALOG) 0.5 % Apply 1 application topically 2 (two) times daily for 10 days. 07/22/20 08/01/20  Danton Clap, PA-C  fluPHENAZine (PROLIXIN) 2.5 MG tablet Take 2.5 mg by mouth at bedtime.   07/14/20  [provider]    Family History Family History  Problem Relation Age of Onset  . Cancer Father        esophageal   . Hypertension Father   . Heart failure Mother   . Hypertension Mother   . Early death Mother   . Heart disease Mother        chf  . Breast cancer Sister 94  . Cancer Sister        breast  . Depression Sister   . Mental illness Sister   . Asthma Son   . Cancer Other        brain cancer    Social History Social History   Tobacco Use  . Smoking status: Never Smoker  . Smokeless tobacco: Former Systems developer     Types: Snuff  . Tobacco comment: Is in process of decreasing amount  Vaping Use  . Vaping Use: Never used  Substance Use Topics  . Alcohol use: No    Alcohol/week: 0.0 standard drinks  . Drug use: No     Allergies   Cephalexin, Hctz [hydrochlorothiazide], Lisinopril, Losartan, Penicillins, Bee venom, and Metformin and related   Review of Systems Review of Systems  Constitutional: Negative for fatigue and fever.  HENT: Negative for facial swelling.   Respiratory: Negative for cough, chest tightness and shortness of breath.   Cardiovascular: Negative for chest pain.  Gastrointestinal: Negative for abdominal pain, nausea and vomiting.  Musculoskeletal: Negative for arthralgias and myalgias.  Skin: Positive for rash.  Neurological: Negative for dizziness, weakness and headaches.     Physical Exam Triage Vital Signs ED Triage Vitals  Enc Vitals Group     BP 07/22/20 1532 140/64     Pulse Rate 07/22/20 1532 83     Resp 07/22/20 1532 18     Temp 07/22/20 1532 99.2 F (37.3 C)     Temp Source 07/22/20 1532 Oral     SpO2 07/22/20 1532 98 %     Weight 07/22/20 1529 250 lb (113.4 kg)     Height 07/22/20 1529 '5\' 3"'  (1.6 m)     Head Circumference --      Peak Flow --      Pain Score 07/22/20 1528 0     Pain Loc --      Pain Edu? --      Excl. in [place]? --    No data found.  Updated Vital Signs BP 140/64 (BP Location: Right Arm)   Pulse 83   Temp 99.2  F (37.3 C) (Oral)   Resp 18   Ht '5\' 3"'  (1.6 m)   Wt 250 lb (113.4 kg)   SpO2 98%   BMI 44.29 kg/m       Physical Exam Vitals and nursing note reviewed.  Constitutional:      General: She is not in acute distress.    Appearance: Normal appearance. She is not ill-appearing or toxic-appearing.  HENT:     Head: Normocephalic and atraumatic.     Nose: Nose normal.     Mouth/Throat:     Mouth: Mucous membranes are moist.     Pharynx: Oropharynx is clear.  Eyes:     General: No scleral icterus.       Right eye: No  discharge.        Left eye: No discharge.     Conjunctiva/sclera: Conjunctivae normal.  Cardiovascular:     Rate and Rhythm: Normal rate and regular rhythm.     Heart sounds: Normal heart sounds.  Pulmonary:     Effort: Pulmonary effort is normal. No respiratory distress.     Breath sounds: Normal breath sounds.  Musculoskeletal:     Cervical back: Neck supple.  Skin:    General: Skin is dry.     Findings: Rash (diffuse erythematous wheals of entire body including face, trunk, back, upper and lower extremities ) present.  Neurological:     General: No focal deficit present.     Mental Status: She is alert. Mental status is at baseline.     Motor: No weakness.     Gait: Gait normal.  Psychiatric:        Mood and Affect: Mood normal.        Behavior: Behavior normal.        Thought Content: Thought content normal.      UC Treatments / Results  Labs (all labs ordered are listed, but only abnormal results are displayed) Labs Reviewed - No data to display  EKG   Radiology No results found.  Procedures Procedures (including critical care time)  Medications Ordered in UC Medications  methylPREDNISolone sodium succinate (SOLU-MEDROL) 125 mg/2 mL injection 125 mg (125 mg Intramuscular Given 07/22/20 1555)    Initial Impression / Assessment and Plan / UC Course  I have reviewed the triage vital signs and the nursing notes.  Pertinent labs & imaging results that were available during my care of the patient were reviewed by me and considered in my medical decision making (see chart for details).   Exam is consistent with diffuse hives.  Advised patient to take cetirizine twice a day and take her hydroxyzine 3 times a day.  Patient given an injection of 125 Solu-Medrol in clinic IM.  I have also prescribed triamcinolone topical ointment for the areas that caused her the most itching and irritation.  Patient does have a follow-up with her PCP tomorrow.  Advised patient she will  likely need referral to an allergist or dermatologist as well as work-up including labs since there are various causes of hives which go beyond just allergies.  ED precautions discussed.  Final Clinical Impressions(s) / UC Diagnoses   Final diagnoses:  Urticaria  Pruritus     Discharge Instructions     As discussed there are many different causes of hives ranging from allergies to physical stimuli, to stress.  There are also viral infections that can cause hives.  Certain autoimmune diseases can cause hives.  At this time take cetirizine twice daily.  Make sure you are taking the hydroxyzine 3 times daily.  You can apply the triamcinolone cream to the most troublesome areas.  Avoid face.  Keep your follow-up with your PCP as you will likely need further work-up including certain labs:  Complete Blood Count with differential Erythrocyte Sedimentation Rate (ESR C-Reactive Protein (C-RP) Urinalysis Liver Function Tests Thyroid Stimulating Hormone (TSH)  *Go to emergency room for any acute swelling of your face, lips, throat or you have any trouble breathing or chest tightness.    ED Prescriptions    Medication Sig Dispense Auth. Provider   triamcinolone ointment (KENALOG) 0.5 % Apply 1 application topically 2 (two) times daily for 10 days. 30 g Gretta Cool     PDMP not reviewed this encounter.   Danton Clap, PA-C 07/22/20 1607

## 2020-07-22 NOTE — ED Triage Notes (Addendum)
Pt c/o red bumps on her arms, legs, stomach, chest, back and face. She states she never improved from the las time she was seen. Pt states the rash is itchy. No change in soap, detergents, or medications.

## 2020-07-22 NOTE — Discharge Instructions (Addendum)
As discussed there are many different causes of hives ranging from allergies to physical stimuli, to stress.  There are also viral infections that can cause hives.  Certain autoimmune diseases can cause hives.  At this time take cetirizine twice daily.  Make sure you are taking the hydroxyzine 3 times daily.  You can apply the triamcinolone cream to the most troublesome areas.  Avoid face.  Keep your follow-up with your PCP as you will likely need further work-up including certain labs:  Complete Blood Count with differential Erythrocyte Sedimentation Rate (ESR C-Reactive Protein (C-RP) Urinalysis Liver Function Tests Thyroid Stimulating Hormone (TSH)  *Go to emergency room for any acute swelling of your face, lips, throat or you have any trouble breathing or chest tightness.

## 2020-07-24 ENCOUNTER — Ambulatory Visit (INDEPENDENT_AMBULATORY_CARE_PROVIDER_SITE_OTHER): Payer: Medicare Other | Admitting: Internal Medicine

## 2020-07-24 ENCOUNTER — Other Ambulatory Visit: Payer: Self-pay

## 2020-07-24 ENCOUNTER — Encounter: Payer: Self-pay | Admitting: Internal Medicine

## 2020-07-24 VITALS — BP 102/70 | HR 94 | Temp 98.8°F | Ht 63.0 in | Wt 235.2 lb

## 2020-07-24 DIAGNOSIS — F419 Anxiety disorder, unspecified: Secondary | ICD-10-CM

## 2020-07-24 DIAGNOSIS — Z9989 Dependence on other enabling machines and devices: Secondary | ICD-10-CM

## 2020-07-24 DIAGNOSIS — L509 Urticaria, unspecified: Secondary | ICD-10-CM

## 2020-07-24 DIAGNOSIS — G4733 Obstructive sleep apnea (adult) (pediatric): Secondary | ICD-10-CM

## 2020-07-24 DIAGNOSIS — T7840XA Allergy, unspecified, initial encounter: Secondary | ICD-10-CM

## 2020-07-24 DIAGNOSIS — Z1231 Encounter for screening mammogram for malignant neoplasm of breast: Secondary | ICD-10-CM

## 2020-07-24 DIAGNOSIS — R7303 Prediabetes: Secondary | ICD-10-CM

## 2020-07-24 MED ORDER — HYDROXYZINE HCL 25 MG PO TABS: 25.0000 mg | ORAL_TABLET | Freq: Three times a day (TID) | ORAL | 3 refills | Status: DC | PRN

## 2020-07-24 MED ORDER — HYDROCORTISONE 2.5 % EX LOTN: TOPICAL_LOTION | Freq: Two times a day (BID) | CUTANEOUS | 5 refills | Status: DC

## 2020-07-24 MED ORDER — CLOBETASOL PROPIONATE 0.05 % EX CREA: 1.0000 "application " | TOPICAL_CREAM | Freq: Two times a day (BID) | CUTANEOUS | 2 refills | Status: DC

## 2020-07-24 NOTE — Progress Notes (Addendum)
Chief Complaint  Patient presents with  . Follow-up  . Rash    ongoing since 9/10 from face to bilateral arms. No pain just itching.    Recurrent Hives since 07/14/20 went to urgent care and 07/22/20 was given prednisone taper and zyrtec 10 mg bid and hydroxyzine 25 tid prn and 10/17 went back red itchy rash face, arms, chest, legs still there and given injection depomedrol 125 mg x 1 and still with redness and itching. No new soaps, laundry detergents, TMC cream is not helping and areas not going away. She does report her son has a dog which is new  Denies insect bite or stressors as trigger   OSA not on cpap needs new sleep study   Anxiety also takes hydroxyzine 25 tid prn    Review of Systems  Constitutional: Negative for weight loss.  HENT: Negative for hearing loss.   Eyes: Negative for blurred vision.  Respiratory: Negative for shortness of breath.   Cardiovascular: Negative for chest pain.  Skin: Positive for itching and rash.  Psychiatric/Behavioral: Negative for depression. The patient is not nervous/anxious.    Past Medical History:  Diagnosis Date  . Allergic rhinitis   . Bipolar disorder ([place])   . Chronic low back pain   . CTS (carpal tunnel syndrome)    hands  . Depression   . Gallstones   . GERD (gastroesophageal reflux disease)   . History of prediabetes   . Hypertension   . Laceration of right foot   . Onychomycosis   . OSA on CPAP   . Overactive bladder   . PVC's (premature ventricular contractions)   . Seizures ([place])    8th grade   Past Surgical History:  Procedure Laterality Date  . ABDOMINAL HYSTERECTOMY  07/08/2011  . ABDOMINAL HYSTERECTOMY     supracervical in 07/2011   . CARPAL TUNNEL RELEASE     right hand 1996  . CESAREAN SECTION     2012  . CHOLECYSTECTOMY N/A 01/12/2018   Procedure: LAPAROSCOPIC CHOLECYSTECTOMY WITH INTRAOPERATIVE CHOLANGIOGRAM ERAS PATHWAY;  Surgeon: Johnathan Hausen, MD;  Location: [facility];  Service: General;  Laterality:  N/A;  . COLONOSCOPY WITH PROPOFOL N/A 04/15/2019   Procedure: COLONOSCOPY WITH PROPOFOL;  Surgeon: Jonathon Bellows, MD;  Location: [facility]COPY;  Service: Gastroenterology;  Laterality: N/A;  . DILATION AND CURETTAGE OF UTERUS     04/2011 benign endometrial polyp squamous metaplasia, inflamm, blood, mucous, benign   . FOOT SURGERY     Dr. Prudence Davidson   . LEEP     2006  . TUBAL LIGATION     Family History  Problem Relation Age of Onset  . Cancer Father        esophageal   . Hypertension Father   . Heart failure Mother   . Hypertension Mother   . Early death Mother   . Heart disease Mother        chf  . Breast cancer Sister 10  . Cancer Sister        breast  . Depression Sister   . Mental illness Sister   . Asthma Son   . Cancer Other        brain cancer   Social History   Socioeconomic History  . Marital status: Married    Spouse name: Not on file  . Number of children: Not on file  . Years of education: Not on file  . Highest education level: Not on file  Occupational History  . Not  on file  Tobacco Use  . Smoking status: Never Smoker  . Smokeless tobacco: Former Systems developer    Types: Snuff  . Tobacco comment: Is in process of decreasing amount  Vaping Use  . Vaping Use: Never used  Substance and Sexual Activity  . Alcohol use: No    Alcohol/week: 0.0 standard drinks  . Drug use: No  . Sexual activity: Yes    Comment: men  Other Topics Concern  . Not on file  Social History Narrative   Married    No guns    Wears seat belt    Safe in relationship    Never smoker    2 sons Gaspar Bidding ~10 and another son ~28 as of 10/2019   1 granson eli 1 y.o as of 10/2019   2 sisters    Unemployed       Social Determinants of Health   Financial Resource Strain:   . Difficulty of Paying Living Expenses: Not on file  Food Insecurity:   . Worried About Charity fundraiser in the Last Year: Not on file  . Ran Out of Food in the Last Year: Not on file  Transportation Needs:   . Lack of  Transportation (Medical): Not on file  . Lack of Transportation (Non-Medical): Not on file  Physical Activity:   . Days of Exercise per Week: Not on file  . Minutes of Exercise per Session: Not on file  Stress:   . Feeling of Stress : Not on file  Social Connections:   . Frequency of Communication with Friends and Family: Not on file  . Frequency of Social Gatherings with Friends and Family: Not on file  . Attends Religious Services: Not on file  . Active Member of Clubs or Organizations: Not on file  . Attends Archivist Meetings: Not on file  . Marital Status: Not on file  Intimate Partner Violence:   . Fear of Current or Ex-Partner: Not on file  . Emotionally Abused: Not on file  . Physically Abused: Not on file  . Sexually Abused: Not on file   Current Meds  Medication Sig  . amLODipine (NORVASC) 5 MG tablet Take 1 tablet (5 mg total) by mouth daily.  . Blood Glucose Calibration (TRUE METRIX LEVEL 1) Low SOLN   . cetirizine (ZYRTEC ALLERGY) 10 MG tablet Take 1 tablet (10 mg total) by mouth daily.  Marland Kitchen EPINEPHrine 0.3 mg/0.3 mL IJ SOAJ injection Inject 0.3 mLs (0.3 mg total) into the muscle as needed for anaphylaxis.  Marland Kitchen FLUoxetine (PROZAC) 10 MG capsule Take 10 mg by mouth daily.  . fluticasone (FLONASE) 50 MCG/ACT nasal spray Place 2 sprays into both nostrils daily.  Marland Kitchen gabapentin (NEURONTIN) 300 MG capsule Take 1 capsule (300 mg total) by mouth 3 (three) times daily.  . hydrOXYzine (ATARAX/VISTARIL) 25 MG tablet Take 1 tablet (25 mg total) by mouth every 8 (eight) hours as needed.  . Insulin Pen Needle (PEN NEEDLES) 30G X 8 MM MISC 1 Device by Does not apply route daily.  Marland Kitchen liraglutide (VICTOZA) 18 MG/3ML SOPN Inject 0.3 mLs (1.8 mg total) into the skin daily.  . meloxicam (MOBIC) 15 MG tablet Take 15 mg by mouth daily.  . metoprolol succinate (TOPROL-XL) 25 MG 24 hr tablet Take 1 tablet (25 mg total) by mouth daily. Take with or immediately following a meal.  .  oxyCODONE-acetaminophen (PERCOCET) 10-325 MG tablet Take 1 tablet by mouth 3 (three) times daily as needed.  Marland Kitchen  pantoprazole (PROTONIX) 40 MG tablet Take 1 tablet (40 mg total) by mouth daily.  . phentermine (ADIPEX-P) 37.5 MG tablet TAKE ONE-HALF TABLET BY MOUTH ONCE DAILY BEFORE BREAKFAST  . pravastatin (PRAVACHOL) 10 MG tablet Take 1 tablet (10 mg total) by mouth daily.  . Probiotic Product (PROBIOTIC DAILY PO) Take by mouth. probiogen probiotic daily  . spironolactone (ALDACTONE) 25 MG tablet Take 0.5 tablets (12.5 mg total) by mouth daily. Am  . topiramate (TOPAMAX) 100 MG tablet Take 100 mg by mouth daily.  . [DISCONTINUED] hydrOXYzine (ATARAX/VISTARIL) 25 MG tablet Take 25 mg by mouth 3 (three) times daily.   Allergies  Allergen Reactions  . Cephalexin Swelling and Rash  . Hctz [Hydrochlorothiazide] Rash and Swelling    Mild she says  . Lisinopril Hives and Other (See Comments)    Headache, Low BP, angioedema    . Losartan Hives and Swelling    Angioedema  . Penicillins Rash    Has patient had a PCN reaction causing immediate rash, facial/tongue/throat swelling, SOB or lightheadedness with hypotension: Yes Has patient had a PCN reaction causing severe rash involving mucus membranes or skin necrosis: No Has patient had a PCN reaction that required hospitalization: No Has patient had a PCN reaction occurring within the last 10 years: No If all of the above answers are "NO", then may proceed with Cephalosporin use.   . Bee Venom     Swelling   . Metformin And Related     Rash    Recent Results (from the past 2160 hour(s))  Urinalysis, Routine w reflex microscopic     Status: None   Collection Time: 05/21/20  9:23 AM  Result Value Ref Range   Color, Urine YELLOW YELLOW   APPearance CLEAR CLEAR   Specific Gravity, Urine 1.018 1.001 - 1.03   pH 5.5 5.0 - 8.0   Glucose, UA NEGATIVE NEGATIVE   Bilirubin Urine NEGATIVE NEGATIVE   Ketones, ur NEGATIVE NEGATIVE   Hgb urine  dipstick NEGATIVE NEGATIVE   Protein, ur NEGATIVE NEGATIVE   Nitrite NEGATIVE NEGATIVE   Leukocytes,Ua NEGATIVE NEGATIVE   WBC, UA NONE SEEN 0 - 5 /HPF   RBC / HPF NONE SEEN 0 - 2 /HPF   Squamous Epithelial / LPF NONE SEEN < OR = 5 /HPF   Bacteria, UA NONE SEEN NONE SEEN /HPF   Hyaline Cast NONE SEEN NONE SEEN /LPF  TSH     Status: None   Collection Time: 05/21/20  9:23 AM  Result Value Ref Range   TSH 1.23 0.35 - 4.50 uIU/mL  CBC with Differential/Platelet     Status: Abnormal   Collection Time: 05/21/20  9:23 AM  Result Value Ref Range   WBC 6.2 4.0 - 10.5 K/uL   RBC 4.14 3.87 - 5.11 Mil/uL   Hemoglobin 12.1 12.0 - 15.0 g/dL   HCT 34.8 (L) 36 - 46 %   MCV 84.1 78.0 - 100.0 fl   MCHC 34.7 30.0 - 36.0 g/dL   RDW 14.1 11.5 - 15.5 %   Platelets 184.0 150 - 400 K/uL   Neutrophils Relative % 54.9 43 - 77 %   Lymphocytes Relative 32.5 12 - 46 %   Monocytes Relative 9.3 3 - 12 %   Eosinophils Relative 2.5 0 - 5 %   Basophils Relative 0.8 0 - 3 %   Neutro Abs 3.4 1.4 - 7.7 K/uL   Lymphs Abs 2.0 0.7 - 4.0 K/uL   Monocytes Absolute 0.6 0.1 -  1.0 K/uL   Eosinophils Absolute 0.2 0.0 - 0.7 K/uL   Basophils Absolute 0.0 0.0 - 0.1 K/uL  Lipid panel     Status: None   Collection Time: 05/21/20  9:23 AM  Result Value Ref Range   Cholesterol 127 0 - 200 mg/dL    Comment: ATP III Classification       Desirable:  < 200 mg/dL               Borderline High:  200 - 239 mg/dL          High:  > = 240 mg/dL   Triglycerides 52.0 0 - 149 mg/dL    Comment: Normal:  <150 mg/dLBorderline High:  150 - 199 mg/dL   HDL 51.40 >39.00 mg/dL   VLDL 10.4 0.0 - 40.0 mg/dL   LDL Cholesterol 65 0 - 99 mg/dL   Total CHOL/HDL Ratio 2     Comment:                Men          Women1/2 Average Risk     3.4          3.3Average Risk          5.0          4.42X Average Risk          9.6          7.13X Average Risk          15.0          11.0                       NonHDL 75.17     Comment: NOTE:  Non-HDL goal should be  30 mg/dL higher than patient's LDL goal (i.e. LDL goal of < 70 mg/dL, would have non-HDL goal of < 100 mg/dL)  Comprehensive metabolic panel     Status: Abnormal   Collection Time: 05/21/20  9:23 AM  Result Value Ref Range   Sodium 139 135 - 145 mEq/L   Potassium 4.1 3.5 - 5.1 mEq/L   Chloride 106 96 - 112 mEq/L   CO2 25 19 - 32 mEq/L   Glucose, Bld 109 (H) 70 - 99 mg/dL   BUN 12 6 - 23 mg/dL   Creatinine, Ser 1.03 0.40 - 1.20 mg/dL   Total Bilirubin 0.4 0.2 - 1.2 mg/dL   Alkaline Phosphatase 65 39 - 117 U/L   AST 15 0 - 37 U/L   ALT 18 0 - 35 U/L   Total Protein 6.3 6.0 - 8.3 g/dL   Albumin 4.0 3.5 - 5.2 g/dL   GFR 68.10 >60.00 mL/min   Calcium 8.9 8.4 - 10.5 mg/dL   Objective  Body mass index is 41.66 kg/m. Wt Readings from Last 3 Encounters:  07/24/20 235 lb 3.2 oz (106.7 kg)  07/22/20 250 lb (113.4 kg)  07/14/20 250 lb (113.4 kg)   Temp Readings from Last 3 Encounters:  07/24/20 98.8 F (37.1 C) (Oral)  07/22/20 99.2 F (37.3 C) (Oral)  07/14/20 98.3 F (36.8 C) (Oral)   BP Readings from Last 3 Encounters:  07/24/20 102/70  07/22/20 140/64  07/14/20 133/73   Pulse Readings from Last 3 Encounters:  07/24/20 94  07/22/20 83  07/14/20 84    Physical Exam Vitals and nursing note reviewed.  Constitutional:      Appearance: Normal appearance. She is well-developed. She is morbidly obese.  HENT:     Head: Normocephalic and atraumatic.  Eyes:     Conjunctiva/sclera: Conjunctivae normal.     Pupils: Pupils are equal, round, and reactive to light.  Cardiovascular:     Rate and Rhythm: Normal rate and regular rhythm.     Heart sounds: Normal heart sounds. No murmur heard.   Pulmonary:     Effort: Pulmonary effort is normal.     Breath sounds: Normal breath sounds.  Skin:    General: Skin is warm and dry.     Findings: Rash present. Rash is urticarial.     Comments: Diffuse urticarial rash face, arms, legs, trunk ,buttocks   Neurological:     General: No  focal deficit present.     Mental Status: She is alert and oriented to person, place, and time. Mental status is at baseline.     Gait: Gait normal.  Psychiatric:        Attention and Perception: Attention and perception normal.        Mood and Affect: Mood and affect normal.        Speech: Speech normal.        Behavior: Behavior normal. Behavior is cooperative.        Thought Content: Thought content normal.        Cognition and Memory: Cognition and memory normal.        Judgment: Judgment normal.     Assessment  Plan  Urticaria - Plan: Ambulatory referral to Dermatology, Ambulatory referral to Allergy, hydrOXYzine (ATARAX/VISTARIL) 25 MG tablet tid prn, hydrocortisone 2.5 % lotion, clobetasol cream (TEMOVATE) 0.05 %  Allergic reaction, initial encounter - Plan: Ambulatory referral to Allergy, hydrOXYzine (ATARAX/VISTARIL) 25 MG tablet, hydrocortisone 2.5 % lotion, clobetasol cream (TEMOVATE) 0.05 %  Anxiety - Plan: hydrOXYzine (ATARAX/VISTARIL) 25 MG tablet, clobetasol cream (TEMOVATE) 0.05 %  OSA on not on cpap  Consider repeat sleep study In fuutre   HM Declines flu shotallergic Tdap utd due 7/13/2020will do in future at f/u [place] 2/2 consider booster  Allergic to pna vaccine Consider shingris Consider shingrix but given allergies use caution Not immuneMMR, hep B titer in futureuse caution though with h/o allergies with all vaccines covid 19 vaccine 2/2  915/21 house calls visit A1c 6.1   h/o supracervical hysterectomy ([facility]), pap 02/29/16 negative no comment HPV, another pap 06/23/17 neg pap neg HPV  -h/o LEEP 2006  mammo 08/04/19 negativeordered   Colonoscopy7/10/20 normal f/u in 10 years  Echo Dr.Kowalski  -nl LV function EF 60% moderate mitral insuff and mild tricuspid insufficiency treadmill stress test neg 07/30/13 -referred again today   HIV neg 02/04/16  Ob/gyn appt pending bladder leakage [facility](stopped  PT w/o help worsen sneezing or coughing) -Dr. Georgianne Fick pt wants surgeryas of 04/20/20 sx's better and no surgery indicated    Consider hep C testing  Consider referral repeat sleep study h/o OSA   Provider: Dr. Olivia Mackie McLean-Scocuzza-Internal Medicine

## 2020-07-24 NOTE — Patient Instructions (Addendum)
Take hydroxyzine 25 3x per day as needed   Zyrtec 10 mg take 1x per day   sarna lotion/calamine/caladryl Oatmeal bath no fragrance    Hives Hives (urticaria) are itchy, red, swollen areas on the skin. Hives can appear on any part of the body. Hives often fade within 24 hours (acute hives). Sometimes, new hives appear after old ones fade and the cycle can continue for several days or weeks (chronic hives). Hives do not spread from person to person (are not contagious). Hives come from the body's reaction to something a person is allergic to (allergen), something that causes irritation, or various other triggers. When a person is exposed to a trigger, his or her body releases a chemical (histamine) that causes redness, itching, and swelling. Hives can appear right after exposure to a trigger or hours later. What are the causes? This condition may be caused by:  Allergies to foods or ingredients.  Insect bites or stings.  Exposure to pollen or pets.  Contact with latex or chemicals.  Spending time in sunlight, heat, or cold (exposure).  Exercise.  Stress.  Certain medicines. You can also get hives from other medical conditions and treatments, such as:  Viruses, including the common cold.  Bacterial infections, such as urinary tract infections and strep throat.  Certain medicines.  Allergy shots.  Blood transfusions. Sometimes, the cause of this condition is not known (idiopathic hives). What increases the risk? You are more likely to develop this condition if you:  Are a woman.  Have food allergies, especially to citrus fruits, milk, eggs, peanuts, tree nuts, or shellfish.  Are allergic to: ? Medicines. ? Latex. ? Insects. ? Animals. ? Pollen. What are the signs or symptoms? Common symptoms of this condition include raised, itchy, red or white bumps or patches on your skin. These areas may:  Become large and swollen (welts).  Change in shape and location,  quickly and repeatedly.  Be separate hives or connect over a large area of skin.  Sting or become painful.  Turn white when pressed in the center (blanch). In severe cases, yourhands, feet, and face may also become swollen. This may occur if hives develop deeper in your skin. How is this diagnosed? This condition may be diagnosed by your symptoms, medical history, and physical exam.  Your skin, urine, or blood may be tested to find out what is causing your hives and to rule out other health issues.  Your health care provider may also remove a small sample of skin from the affected area and examine it under a microscope (biopsy). How is this treated? Treatment for this condition depends on the cause and severity of your symptoms. Your health care provider may recommend using cool, wet cloths (cool compresses) or taking cool showers to relieve itching. Treatment may include:  Medicines that help: ? Relieve itching (antihistamines). ? Reduce swelling (corticosteroids). ? Treat infection (antibiotics).  An injectable medicine (omalizumab). Your health care provider may prescribe this if you have chronic idiopathic hives and you continue to have symptoms even after treatment with antihistamines. Severe cases may require an emergency injection of adrenaline (epinephrine) to prevent a life-threatening allergic reaction (anaphylaxis). Follow these instructions at home: Medicines  Take and apply over-the-counter and prescription medicines only as told by your health care provider.  If you were prescribed an antibiotic medicine, take it as told by your health care provider. Do not stop using the antibiotic even if you start to feel better. Skin care  Apply  cool compresses to the affected areas.  Do not scratch or rub your skin. General instructions  Do not take hot showers or baths. This can make itching worse.  Do not wear tight-fitting clothing.  Use sunscreen and wear protective  clothing when you are outside.  Avoid any substances that cause your hives. Keep a journal to help track what causes your hives. Write down: ? What medicines you take. ? What you eat and drink. ? What products you use on your skin.  Keep all follow-up visits as told by your health care provider. This is important. Contact a health care provider if:  Your symptoms are not controlled with medicine.  Your joints are painful or swollen. Get help right away if:  You have a fever.  You have pain in your abdomen.  Your tongue or lips are swollen.  Your eyelids are swollen.  Your chest or throat feels tight.  You have trouble breathing or swallowing. These symptoms may represent a serious problem that is an emergency. Do not wait to see if the symptoms will go away. Get medical help right away. Call your local emergency services (911 in the [place] Do not drive yourself to the hospital. Summary  Hives (urticaria) are itchy, red, swollen areas on your skin. Hives come from the body's reaction to something a person is allergic to (allergen), something that causes irritation, or various other triggers.  Treatment for this condition depends on the cause and severity of your symptoms.  Avoid any substances that cause your hives. Keep a journal to help track what causes your hives.  Take and apply over-the-counter and prescription medicines only as told by your health care provider.  Keep all follow-up visits as told by your health care provider. This is important. This information is not intended to replace advice given to you by your health care provider. Make sure you discuss any questions you have with your health care provider. Document Revised: 04/07/2018 Document Reviewed: 04/07/2018 Elsevier Patient Education  [place].

## 2020-07-25 ENCOUNTER — Other Ambulatory Visit: Payer: Self-pay | Admitting: Internal Medicine

## 2020-07-25 ENCOUNTER — Telehealth: Payer: Self-pay | Admitting: Internal Medicine

## 2020-07-25 ENCOUNTER — Encounter: Admit: 2020-07-25 | Discharge: 2020-07-25 | Payer: MEDICARE

## 2020-07-25 ENCOUNTER — Ambulatory Visit: Admit: 2020-07-25 | Discharge: 2020-07-25 | Payer: MEDICARE

## 2020-07-25 DIAGNOSIS — R21 Rash and other nonspecific skin eruption: Principal | ICD-10-CM

## 2020-07-25 DIAGNOSIS — R7989 Other specified abnormal findings of blood chemistry: Secondary | ICD-10-CM

## 2020-07-25 DIAGNOSIS — R748 Abnormal levels of other serum enzymes: Secondary | ICD-10-CM

## 2020-07-25 MED ORDER — CETIRIZINE 10 MG TABLET
ORAL_TABLET | 3 refills | 0.00000 days | Status: CP
Start: 2020-07-25 — End: 2020-07-25

## 2020-07-25 MED ORDER — PREDNISONE 10 MG TABLET
ORAL_TABLET | ORAL | 0 refills | 0.00000 days | Status: CP
Start: 2020-07-25 — End: ?

## 2020-07-25 MED ORDER — FEXOFENADINE 180 MG TABLET
ORAL_TABLET | ORAL | 3 refills | 0.00000 days | Status: CP
Start: 2020-07-25 — End: 2020-07-25

## 2020-07-25 MED ORDER — TRIAMCINOLONE ACETONIDE 0.1 % TOPICAL CREAM
INTRAMUSCULAR | 2 refills | 0.00000 days | Status: CP
Start: 2020-07-25 — End: ?

## 2020-07-25 NOTE — Telephone Encounter (Signed)
Called [facility] today about pt they did bx c/w drug reaction will reach out to psych to try to dc psych some of psych meds with labs 07/25/20 elevated creatinine and elevated lfts    Dermatologist wants repeat cmet Friday or Monday -Arianna please schedule   We discussed stopping pravachol/protonix/holding spironolactone 12.5 mg daily for now and increase water 55-64 ounces daily   I am not aware any new meds pt is on but she does have a dog at home which is new   Skin bx today with derm and he will f/u in 2 weeks in office

## 2020-07-26 DIAGNOSIS — Z79891 Long term (current) use of opiate analgesic: Secondary | ICD-10-CM | POA: Diagnosis not present

## 2020-07-26 DIAGNOSIS — M47816 Spondylosis without myelopathy or radiculopathy, lumbar region: Secondary | ICD-10-CM | POA: Diagnosis not present

## 2020-07-26 DIAGNOSIS — G894 Chronic pain syndrome: Secondary | ICD-10-CM | POA: Diagnosis not present

## 2020-07-26 DIAGNOSIS — M797 Fibromyalgia: Secondary | ICD-10-CM | POA: Diagnosis not present

## 2020-07-27 NOTE — Telephone Encounter (Signed)
Left message to return call 

## 2020-07-31 NOTE — Telephone Encounter (Signed)
Left message to return call. Letter sent

## 2020-08-07 ENCOUNTER — Encounter: Admit: 2020-08-07 | Discharge: 2020-08-08 | Payer: MEDICARE

## 2020-08-07 DIAGNOSIS — L509 Urticaria, unspecified: Principal | ICD-10-CM

## 2020-08-07 MED ORDER — CETIRIZINE 10 MG TABLET
ORAL_TABLET | 3 refills | 0.00000 days | Status: CP
Start: 2020-08-07 — End: ?

## 2020-08-07 MED ORDER — FEXOFENADINE 180 MG TABLET
ORAL_TABLET | ORAL | 3 refills | 0.00000 days | Status: CP
Start: 2020-08-07 — End: ?

## 2020-08-11 DIAGNOSIS — G44209 Tension-type headache, unspecified, not intractable: Secondary | ICD-10-CM | POA: Diagnosis not present

## 2020-08-11 DIAGNOSIS — R220 Localized swelling, mass and lump, head: Secondary | ICD-10-CM | POA: Diagnosis not present

## 2020-08-11 DIAGNOSIS — T783XXA Angioneurotic edema, initial encounter: Secondary | ICD-10-CM | POA: Diagnosis not present

## 2020-08-14 ENCOUNTER — Telehealth: Payer: Self-pay | Admitting: Internal Medicine

## 2020-08-14 DIAGNOSIS — E119 Type 2 diabetes mellitus without complications: Secondary | ICD-10-CM | POA: Diagnosis not present

## 2020-08-14 NOTE — Telephone Encounter (Signed)
Ok to work in and what is reason for appt?

## 2020-08-14 NOTE — Telephone Encounter (Signed)
Patient called to get an appointment  No opening she stated that Johnson said that she would work her in some where she new number is [phone]

## 2020-08-14 NOTE — Telephone Encounter (Signed)
Please advise 

## 2020-08-15 ENCOUNTER — Other Ambulatory Visit: Payer: Self-pay

## 2020-08-15 ENCOUNTER — Ambulatory Visit
Admission: EM | Admit: 2020-08-15 | Discharge: 2020-08-15 | Disposition: A | Discharge status: Home / Self Care | Payer: Medicare Other | Attending: Emergency Medicine | Admitting: Emergency Medicine

## 2020-08-15 DIAGNOSIS — Z20822 Contact with and (suspected) exposure to covid-19: Secondary | ICD-10-CM | POA: Insufficient documentation

## 2020-08-15 DIAGNOSIS — Z880 Allergy status to penicillin: Secondary | ICD-10-CM | POA: Diagnosis not present

## 2020-08-15 DIAGNOSIS — R519 Headache, unspecified: Secondary | ICD-10-CM | POA: Diagnosis not present

## 2020-08-15 DIAGNOSIS — T50901D Poisoning by unspecified drugs, medicaments and biological substances, accidental (unintentional), subsequent encounter: Secondary | ICD-10-CM | POA: Insufficient documentation

## 2020-08-15 DIAGNOSIS — Z7952 Long term (current) use of systemic steroids: Secondary | ICD-10-CM | POA: Insufficient documentation

## 2020-08-15 DIAGNOSIS — Z881 Allergy status to other antibiotic agents status: Secondary | ICD-10-CM | POA: Diagnosis not present

## 2020-08-15 DIAGNOSIS — Z79899 Other long term (current) drug therapy: Secondary | ICD-10-CM | POA: Insufficient documentation

## 2020-08-15 DIAGNOSIS — Z888 Allergy status to other drugs, medicaments and biological substances status: Secondary | ICD-10-CM | POA: Insufficient documentation

## 2020-08-15 DIAGNOSIS — Z791 Long term (current) use of non-steroidal anti-inflammatories (NSAID): Secondary | ICD-10-CM | POA: Diagnosis not present

## 2020-08-15 DIAGNOSIS — R1310 Dysphagia, unspecified: Secondary | ICD-10-CM | POA: Diagnosis not present

## 2020-08-15 DIAGNOSIS — T783XXD Angioneurotic edema, subsequent encounter: Secondary | ICD-10-CM | POA: Diagnosis not present

## 2020-08-15 DIAGNOSIS — R52 Pain, unspecified: Secondary | ICD-10-CM | POA: Diagnosis present

## 2020-08-15 LAB — GROUP A STREP BY PCR: Group A Strep by PCR: NOT DETECTED

## 2020-08-15 NOTE — ED Provider Notes (Signed)
[facility]    CSN: [id number] Arrival date & time: 08/15/20  1439      History   Chief Complaint Chief Complaint  Patient presents with  . Generalized Body Aches    HPI Heather Sweeney is a 54 y.o. female.   Heather Sweeney presents with multiple concerns. She does feel that she has some difficulty with swallowing some foods, like they are difficult with going down. No vomiting or regurgitation of food. Taking pills without difficulty. She has had an esophageal dilation in the past, years ago, and feels like she needs this repeated. Mild headache. No fevers. She was seen 11/6 by her pcp and treated for angioedema. She is still taking allergy medications as well as prednisone. Her swelling has basically resolved. No rash. She has inquiries about medications she is taking, and brings with her her medication bottles today.     ROS per HPI, negative if not otherwise mentioned.       Past Medical History:  Diagnosis Date  . Allergic rhinitis   . Bipolar disorder ([place])   . Chronic low back pain   . CTS (carpal tunnel syndrome)    hands  . Depression   . Gallstones   . GERD (gastroesophageal reflux disease)   . History of prediabetes   . Hypertension   . Laceration of right foot   . Onychomycosis   . OSA on CPAP   . Overactive bladder   . PVC's (premature ventricular contractions)   . Seizures ([place])    8th grade    Patient Active Problem List   Diagnosis Date Noted  . Prediabetes 07/24/2020  . Arthritis of both knees 06/08/2020  . Annual physical exam 04/20/2020  . Ingrown right big toenail 04/20/2020  . Gastroesophageal reflux disease 04/20/2020  . Pain and swelling of right knee 04/20/2020  . Mitral valve insufficiency 01/10/2020  . Bacterial vaginosis 10/20/2019  . Encounter for screening colonoscopy   . Hives 02/23/2019  . Vitamin D deficiency 02/01/2019  . Mood disorder ([place]) 12/24/2018  . HLD (hyperlipidemia) 09/01/2018  . History of  prediabetes 09/01/2018  . Chronic pain 08/31/2018  . Allergic rhinitis 08/31/2018  . S/P laparoscopic cholecystectomy April 2019 01/12/2018  . Angioedema 07/07/2017  . Bilateral carpal tunnel syndrome 11/03/2016  . Frequent PVCs 09/24/2016  . Morbid obesity with BMI of 40.0-44.9, adult ([place]) 08/07/2016  . Essential hypertension 07/10/2016  . OSA on CPAP 07/10/2016    Past Surgical History:  Procedure Laterality Date  . ABDOMINAL HYSTERECTOMY  07/08/2011  . ABDOMINAL HYSTERECTOMY     supracervical in 07/2011   . CARPAL TUNNEL RELEASE     right hand 1996  . CESAREAN SECTION     2012  . CHOLECYSTECTOMY N/A 01/12/2018   Procedure: LAPAROSCOPIC CHOLECYSTECTOMY WITH INTRAOPERATIVE CHOLANGIOGRAM ERAS PATHWAY;  Surgeon: Johnathan Hausen, MD;  Location: [facility]S;  Service: General;  Laterality: N/A;  . COLONOSCOPY WITH PROPOFOL N/A 04/15/2019   Procedure: COLONOSCOPY WITH PROPOFOL;  Surgeon: Jonathon Bellows, MD;  Location: [facility] ENDOSCOPY;  Service: Gastroenterology;  Laterality: N/A;  . DILATION AND CURETTAGE OF UTERUS     04/2011 benign endometrial polyp squamous metaplasia, inflamm, blood, mucous, benign   . FOOT SURGERY     Dr. Prudence Davidson   . LEEP     2006  . TUBAL LIGATION      OB History    Gravida  2   Para  2   Term  2   Preterm  AB      Living  2     SAB      TAB      Ectopic      Multiple      Live Births               Home Medications    Prior to Admission medications   Medication Sig Start Date End Date Taking? Authorizing Provider  amLODipine (NORVASC) 5 MG tablet Take 1 tablet (5 mg total) by mouth daily. 04/20/20 04/20/21 Yes McLean-Scocuzza, Nino Glow, MD  Blood Glucose Calibration (TRUE METRIX LEVEL 1) Low SOLN  01/12/20  Yes [provider]  cetirizine (ZYRTEC ALLERGY) 10 MG tablet Take 1 tablet (10 mg total) by mouth daily. 07/14/20  Yes Cook, Jayce G, DO  clobetasol cream (TEMOVATE) 2.77 % Apply 1 application topically 2 (two) times daily. Prn  body not face 07/24/20  Yes McLean-Scocuzza, Nino Glow, MD  EPINEPHrine 0.3 mg/0.3 mL IJ SOAJ injection Inject 0.3 mLs (0.3 mg total) into the muscle as needed for anaphylaxis. 02/19/19  Yes Merlyn Lot, MD  FLUoxetine (PROZAC) 10 MG capsule Take 10 mg by mouth daily. 07/15/20  Yes [provider]  fluticasone (FLONASE) 50 MCG/ACT nasal spray Place 2 sprays into both nostrils daily. 08/30/19  Yes McLean-Scocuzza, Nino Glow, MD  gabapentin (NEURONTIN) 300 MG capsule Take 1 capsule (300 mg total) by mouth 3 (three) times daily. 06/25/20  Yes McLean-Scocuzza, Nino Glow, MD  hydrocortisone 2.5 % lotion Apply topically 2 (two) times daily. Prn face 07/24/20  Yes McLean-Scocuzza, Nino Glow, MD  hydrOXYzine (ATARAX/VISTARIL) 25 MG tablet Take 1 tablet (25 mg total) by mouth every 8 (eight) hours as needed. 07/24/20  Yes McLean-Scocuzza, Nino Glow, MD  Insulin Pen Needle (PEN NEEDLES) 30G X 8 MM MISC 1 Device by Does not apply route daily. 06/29/20  Yes McLean-Scocuzza, Nino Glow, MD  liraglutide (VICTOZA) 18 MG/3ML SOPN Inject 0.3 mLs (1.8 mg total) into the skin daily. 04/20/20  Yes McLean-Scocuzza, Nino Glow, MD  meloxicam (MOBIC) 15 MG tablet Take 15 mg by mouth daily. 05/01/20  Yes [provider]  metoprolol succinate (TOPROL-XL) 25 MG 24 hr tablet Take 1 tablet (25 mg total) by mouth daily. Take with or immediately following a meal. 04/20/20  Yes McLean-Scocuzza, Nino Glow, MD  oxyCODONE-acetaminophen (PERCOCET) 10-325 MG tablet Take 1 tablet by mouth 3 (three) times daily as needed. 05/31/20  Yes [provider]  pantoprazole (PROTONIX) 40 MG tablet Take 1 tablet (40 mg total) by mouth daily. 04/20/20  Yes McLean-Scocuzza, Nino Glow, MD  Probiotic Product (PROBIOTIC DAILY PO) Take by mouth. probiogen probiotic daily   Yes [provider]  topiramate (TOPAMAX) 100 MG tablet Take 100 mg by mouth daily. 03/12/20  Yes [provider]  fluPHENAZine (PROLIXIN) 2.5 MG tablet Take 2.5  mg by mouth at bedtime.   08/15/20 Yes [provider]  phentermine (ADIPEX-P) 37.5 MG tablet TAKE ONE-HALF TABLET BY MOUTH ONCE DAILY BEFORE BREAKFAST 06/12/20   Malachy Mood, MD  pravastatin (PRAVACHOL) 10 MG tablet Take 1 tablet (10 mg total) by mouth daily. 04/20/20   McLean-Scocuzza, Nino Glow, MD  spironolactone (ALDACTONE) 25 MG tablet Take 0.5 tablets (12.5 mg total) by mouth daily. Am 04/20/20   McLean-Scocuzza, Nino Glow, MD    Family History Family History  Problem Relation Age of Onset  . Cancer Father        esophageal   . Hypertension Father   . Heart  failure Mother   . Hypertension Mother   . Early death Mother   . Heart disease Mother        chf  . Breast cancer Sister 24  . Cancer Sister        breast  . Depression Sister   . Mental illness Sister   . Asthma Son   . Cancer Other        brain cancer    Social History Social History   Tobacco Use  . Smoking status: Never Smoker  . Smokeless tobacco: Former Systems developer    Types: Snuff  . Tobacco comment: Is in process of decreasing amount  Vaping Use  . Vaping Use: Never used  Substance Use Topics  . Alcohol use: No    Alcohol/week: 0.0 standard drinks  . Drug use: No     Allergies   Cephalexin, Hctz [hydrochlorothiazide], Lisinopril, Losartan, Penicillins, Bee venom, and Metformin and related   Review of Systems Review of Systems   Physical Exam Triage Vital Signs ED Triage Vitals  Enc Vitals Group     BP 08/15/20 1535 134/72     Pulse Rate 08/15/20 1535 84     Resp 08/15/20 1535 18     Temp 08/15/20 1535 98.8 F (37.1 C)     Temp Source 08/15/20 1535 Oral     SpO2 08/15/20 1535 100 %     Weight 08/15/20 1530 245 lb (111.1 kg)     Height 08/15/20 1530 5\' 3"  (1.6 m)     Head Circumference --      Peak Flow --      Pain Score 08/15/20 1529 10     Pain Loc --      Pain Edu? --      Excl. in [place]? --    No data found.  Updated Vital Signs BP 134/72 (BP Location: Left Arm)   Pulse 84    Temp 98.8 F (37.1 C) (Oral)   Resp 18   Ht 5\' 3"  (1.6 m)   Wt 245 lb (111.1 kg)   SpO2 100%   BMI 43.40 kg/m   Visual Acuity Right Eye Distance:   Left Eye Distance:   Bilateral Distance:    Right Eye Near:   Left Eye Near:    Bilateral Near:     Physical Exam Constitutional:      General: She is not in acute distress.    Appearance: She is well-developed.  HENT:     Head:     Comments: No facial or lip swelling visualized     Mouth/Throat:     Mouth: Mucous membranes are moist.     Pharynx: No oropharyngeal exudate.  Neck:     Comments: Speaking clearly and animatedly, managing secretions without difficulty; no distress Cardiovascular:     Rate and Rhythm: Normal rate.  Pulmonary:     Effort: Pulmonary effort is normal.  Musculoskeletal:     Cervical back: Normal range of motion.  Lymphadenopathy:     Cervical: No cervical adenopathy.  Skin:    General: Skin is warm and dry.  Neurological:     Mental Status: She is alert and oriented to person, place, and time.      UC Treatments / Results  Labs (all labs ordered are listed, but only abnormal results are displayed) Labs Reviewed  GROUP A STREP BY PCR  SARS CORONAVIRUS 2 (TAT 6-24 HRS)    EKG   Radiology No results found.  Procedures  Procedures (including critical care time)  Medications Ordered in UC Medications - No data to display  Initial Impression / Assessment and Plan / UC Course  I have reviewed the triage vital signs and the nursing notes.  Pertinent labs & imaging results that were available during my care of the patient were reviewed by me and considered in my medical decision making (see chart for details).     Reviewed medications with patient as she had some duplicate bottles which she was taking both of unnecessarily (zyrtec brand bottle as well as cetirizine generic bottle, for example). No apparent emergent throat swelling or airway compromise. She is on prednisone  currently. Taking pepcid. Encouraged gi follow up for evaluation for concern for need for esophageal dilation. Return precautions provided. Patient verbalized understanding and agreeable to plan.   Final Clinical Impressions(s) / UC Diagnoses   Final diagnoses:  Nonintractable headache, unspecified chronicity pattern, unspecified headache type  Problems with swallowing     Discharge Instructions     I recommend following up with gastroenterology in regards to your swallowing.  Continue with prescribed prednisone.  If worsening of swallowing or difficulty swallowing please go to the ER immediately.  Tylenol as needed for headache.  Continue to follow with your primary care provider as needed.  We will call you if any of your tests are positive. Negative results can be viewed on your MyChart.    ED Prescriptions    None     PDMP not reviewed this encounter.   Zigmund Gottron, NP 08/15/20 1607

## 2020-08-15 NOTE — ED Triage Notes (Signed)
Patient states that she has been having body aches, sore throat and gland swelling x 3 weeks. States that her grandson was positive for strep yesterday.   States that she went to [facility] walk-in clinic on Sunday for allergic reaction (angioedema). Patient states that she was given prednisone and epi-pen and pepcid and is setting her up with an allergy doctor.

## 2020-08-15 NOTE — Discharge Instructions (Addendum)
I recommend following up with gastroenterology in regards to your swallowing.  Continue with prescribed prednisone.  If worsening of swallowing or difficulty swallowing please go to the ER immediately.  Tylenol as needed for headache.  Continue to follow with your primary care provider as needed.  We will call you if any of your tests are positive. Negative results can be viewed on your MyChart.

## 2020-08-15 NOTE — Telephone Encounter (Signed)
Patient called back stated that she has swelling in her face and she feel like her balloon has shrunk in her throat or she may have strep throat she stated that she was going to the urgent care to have it check to see if she do have strep also need blood done.

## 2020-08-16 ENCOUNTER — Telehealth: Payer: Self-pay | Admitting: Internal Medicine

## 2020-08-16 ENCOUNTER — Other Ambulatory Visit: Payer: Self-pay

## 2020-08-16 DIAGNOSIS — H5213 Myopia, bilateral: Secondary | ICD-10-CM | POA: Diagnosis not present

## 2020-08-16 LAB — SARS CORONAVIRUS 2 (TAT 6-24 HRS): SARS Coronavirus 2: NEGATIVE

## 2020-08-16 NOTE — Telephone Encounter (Signed)
Okay to see Patient in person with these symptoms?

## 2020-08-16 NOTE — Telephone Encounter (Signed)
Should be virtual.

## 2020-08-16 NOTE — Telephone Encounter (Signed)
Can be in person ?  Do we have appts today or Friday

## 2020-08-16 NOTE — Telephone Encounter (Signed)
Patient scheduled for in person tomorrow at 11:30. Patient having lip/tongue swelling first thing in the morning, she had a balloon placed to stretch her throat but she states this has deflated and she is having trouble swallowing. Patient has had ongoing diarrhea for the last 29 days. Patient also having pressure with urination.

## 2020-08-16 NOTE — Telephone Encounter (Signed)
Rejection Reason - Patient did not respond - Pt was spoken to and stated that she would do seminar back in May but hasnt completed required seminar yet. " [facility] said on Aug 16, 2020 12:43 PM

## 2020-08-16 NOTE — Telephone Encounter (Signed)
Pt would like to know if she can have an appt with Dr Kelly Services today to follow up on UC visit. I told her that Dr Aundra Dubin does not have any available appts for in office. She states that she wanted to send message back to you and for you to call her back asap/ Please advise

## 2020-08-17 ENCOUNTER — Ambulatory Visit (INDEPENDENT_AMBULATORY_CARE_PROVIDER_SITE_OTHER): Payer: Medicare Other | Admitting: Internal Medicine

## 2020-08-17 ENCOUNTER — Ambulatory Visit: Payer: Medicare Other | Admitting: Obstetrics and Gynecology

## 2020-08-17 ENCOUNTER — Encounter: Payer: Self-pay | Admitting: Internal Medicine

## 2020-08-17 VITALS — BP 120/78 | HR 78 | Temp 98.4°F | Ht 63.0 in | Wt 247.6 lb

## 2020-08-17 DIAGNOSIS — L509 Urticaria, unspecified: Principal | ICD-10-CM

## 2020-08-17 DIAGNOSIS — T783XXA Angioneurotic edema, initial encounter: Principal | ICD-10-CM

## 2020-08-17 DIAGNOSIS — R1310 Dysphagia, unspecified: Secondary | ICD-10-CM | POA: Diagnosis not present

## 2020-08-17 DIAGNOSIS — F39 Unspecified mood [affective] disorder: Secondary | ICD-10-CM

## 2020-08-17 DIAGNOSIS — T783XXD Angioneurotic edema, subsequent encounter: Secondary | ICD-10-CM | POA: Diagnosis not present

## 2020-08-17 DIAGNOSIS — G8929 Other chronic pain: Secondary | ICD-10-CM | POA: Diagnosis not present

## 2020-08-17 DIAGNOSIS — R269 Unspecified abnormalities of gait and mobility: Secondary | ICD-10-CM

## 2020-08-17 DIAGNOSIS — S8001XA Contusion of right knee, initial encounter: Secondary | ICD-10-CM | POA: Diagnosis not present

## 2020-08-17 DIAGNOSIS — R748 Abnormal levels of other serum enzymes: Secondary | ICD-10-CM

## 2020-08-17 DIAGNOSIS — M25561 Pain in right knee: Secondary | ICD-10-CM | POA: Diagnosis not present

## 2020-08-17 DIAGNOSIS — R197 Diarrhea, unspecified: Secondary | ICD-10-CM

## 2020-08-17 DIAGNOSIS — Z6841 Body Mass Index (BMI) 40.0 and over, adult: Secondary | ICD-10-CM

## 2020-08-17 DIAGNOSIS — L739 Follicular disorder, unspecified: Secondary | ICD-10-CM

## 2020-08-17 DIAGNOSIS — K219 Gastro-esophageal reflux disease without esophagitis: Secondary | ICD-10-CM | POA: Diagnosis not present

## 2020-08-17 DIAGNOSIS — Z13818 Encounter for screening for other digestive system disorders: Secondary | ICD-10-CM | POA: Diagnosis not present

## 2020-08-17 DIAGNOSIS — T782XXA Anaphylactic shock, unspecified, initial encounter: Secondary | ICD-10-CM

## 2020-08-17 DIAGNOSIS — N179 Acute kidney failure, unspecified: Secondary | ICD-10-CM

## 2020-08-17 DIAGNOSIS — M1711 Unilateral primary osteoarthritis, right knee: Secondary | ICD-10-CM | POA: Diagnosis not present

## 2020-08-17 HISTORY — DX: Abnormal levels of other serum enzymes: R74.8

## 2020-08-17 LAB — COMPREHENSIVE METABOLIC PANEL
ALT: 19 U/L (ref 0–35)
AST: 12 U/L (ref 0–37)
Albumin: 3.8 g/dL (ref 3.5–5.2)
Alkaline Phosphatase: 50 U/L (ref 39–117)
BUN: 10 mg/dL (ref 6–23)
CO2: 27 mEq/L (ref 19–32)
Calcium: 9 mg/dL (ref 8.4–10.5)
Chloride: 107 mEq/L (ref 96–112)
Creatinine, Ser: 0.96 mg/dL (ref 0.40–1.20)
GFR: 68.18 mL/min (ref >60.00)
Glucose, Bld: 109 mg/dL — ABNORMAL HIGH (ref 70–99)
Potassium: 3.6 mEq/L (ref 3.5–5.1)
Sodium: 141 mEq/L (ref 135–145)
Total Bilirubin: 0.4 mg/dL (ref 0.2–1.2)
Total Protein: 6.1 g/dL (ref 6.0–8.3)

## 2020-08-17 MED ORDER — FAMOTIDINE 20 MG PO TABS: 20.0000 mg | ORAL_TABLET | Freq: Two times a day (BID) | ORAL | 3 refills | Status: DC

## 2020-08-17 MED ORDER — EPINEPHRINE 0.3 MG/0.3ML IJ SOAJ: 0.3000 mg | INTRAMUSCULAR | 3 refills | Status: DC | PRN

## 2020-08-17 MED ORDER — MUPIROCIN 2 % EX OINT: 1.0000 "application " | TOPICAL_OINTMENT | Freq: Three times a day (TID) | CUTANEOUS | 0 refills | Status: DC

## 2020-08-17 MED ORDER — FLUPHENAZINE HCL 2.5 MG PO TABS: 2.5000 mg | ORAL_TABLET | Freq: Every day | ORAL | Status: DC

## 2020-08-17 NOTE — Progress Notes (Signed)
Chief Complaint  Patient presents with  . Oral Swelling  . Dysphagia  . Back Pain   F/u  1.11/7-11/10/21 hives Angioedema face with lips and tongue swelling, cheek and eyelid swelling to the point she had to take 4 epi pens over the last 2 weeks 1 incident she had whole milk and she does have a dog at home  She is on prednisone 10 mg x 3 days then 5 mg x 3 days per urgent care and I personally call Dr. Campbell Lerner [facility] dermatology today re pts cause and he rec pt take hydroxyzine 25 prn, he rec allegra 360 mg increase to 4x normal dose so 180 mg x 4 pills, and zyrtec 2 pills at night and 4 pills at night, prn Benadryl 25 mg qd prn. Dermatology is working on getting her in to [facility] allergy next week she may be candidate for [employer] he asked me to order G6PD lab today and we need to f/u liver enzymes and kidney function 2. Cyst to scalp with tight braids she she felt at top on scalp  3. Diarrhea due to prednisone per pt and taking prn prednisone  4. Fall 08/20/20 mechanical and hit right knee and right back with worsening pain and pain clinic increased her pain medications to double dose pain clinic appt 09/20/20   Review of Systems  Constitutional: Negative for weight loss.  HENT: Negative for hearing loss.   Eyes: Negative for blurred vision.  Respiratory: Negative for shortness of breath.   Cardiovascular: Negative for chest pain.  Gastrointestinal: Negative for abdominal pain.  Musculoskeletal: Positive for falls and joint pain.  Skin: Positive for itching and rash.  Neurological: Negative for headaches.  Psychiatric/Behavioral: Negative for depression.   Past Medical History:  Diagnosis Date  . Allergic rhinitis   . Bipolar disorder ([place])   . Chronic low back pain   . CTS (carpal tunnel syndrome)    hands  . Depression   . Gallstones   . GERD (gastroesophageal reflux disease)   . History of prediabetes   . Hypertension   . Laceration of right foot   . Onychomycosis   . OSA on  CPAP   . Overactive bladder   . PVC's (premature ventricular contractions)   . Seizures ([place])    8th grade   Past Surgical History:  Procedure Laterality Date  . ABDOMINAL HYSTERECTOMY  07/08/2011  . ABDOMINAL HYSTERECTOMY     supracervical in 07/2011   . CARPAL TUNNEL RELEASE     right hand 1996  . CESAREAN SECTION     2012  . CHOLECYSTECTOMY N/A 01/12/2018   Procedure: LAPAROSCOPIC CHOLECYSTECTOMY WITH INTRAOPERATIVE CHOLANGIOGRAM ERAS PATHWAY;  Surgeon: Johnathan Hausen, MD;  Location: [facility] ORS;  Service: General;  Laterality: N/A;  . COLONOSCOPY WITH PROPOFOL N/A 04/15/2019   Procedure: COLONOSCOPY WITH PROPOFOL;  Surgeon: Jonathon Bellows, MD;  Location: [facility] ENDOSCOPY;  Service: Gastroenterology;  Laterality: N/A;  . DILATION AND CURETTAGE OF UTERUS     04/2011 benign endometrial polyp squamous metaplasia, inflamm, blood, mucous, benign   . FOOT SURGERY     Dr. Prudence Davidson   . LEEP     2006  . TUBAL LIGATION     Family History  Problem Relation Age of Onset  . Cancer Father        esophageal   . Hypertension Father   . Heart failure Mother   . Hypertension Mother   . Early death Mother   . Heart disease Mother  chf  . Breast cancer Sister 78  . Cancer Sister        breast  . Depression Sister   . Mental illness Sister   . Asthma Son   . Cancer Other        brain cancer   Social History   Socioeconomic History  . Marital status: Married    Spouse name: Not on file  . Number of children: Not on file  . Years of education: Not on file  . Highest education level: Not on file  Occupational History  . Not on file  Tobacco Use  . Smoking status: Never Smoker  . Smokeless tobacco: Current User    Types: Snuff  . Tobacco comment: Is in process of decreasing amount  Vaping Use  . Vaping Use: Never used  Substance and Sexual Activity  . Alcohol use: No    Alcohol/week: 0.0 standard drinks  . Drug use: No  . Sexual activity: Yes    Comment: men  Other Topics Concern   . Not on file  Social History Narrative   Married    No guns    Wears seat belt    Safe in relationship    Never smoker    2 sons Gaspar Bidding ~10 and another son ~28 as of 10/2019   1 granson eli 1 y.o as of 10/2019   2 sisters    Unemployed       Social Determinants of Health   Financial Resource Strain:   . Difficulty of Paying Living Expenses: Not on file  Food Insecurity:   . Worried About Charity fundraiser in the Last Year: Not on file  . Ran Out of Food in the Last Year: Not on file  Transportation Needs:   . Lack of Transportation (Medical): Not on file  . Lack of Transportation (Non-Medical): Not on file  Physical Activity:   . Days of Exercise per Week: Not on file  . Minutes of Exercise per Session: Not on file  Stress:   . Feeling of Stress : Not on file  Social Connections:   . Frequency of Communication with Friends and Family: Not on file  . Frequency of Social Gatherings with Friends and Family: Not on file  . Attends Religious Services: Not on file  . Active Member of Clubs or Organizations: Not on file  . Attends Archivist Meetings: Not on file  . Marital Status: Not on file  Intimate Partner Violence:   . Fear of Current or Ex-Partner: Not on file  . Emotionally Abused: Not on file  . Physically Abused: Not on file  . Sexually Abused: Not on file   Current Meds  Medication Sig  . amLODipine (NORVASC) 5 MG tablet Take 1 tablet (5 mg total) by mouth daily.  . benztropine (COGENTIN) 1 MG tablet Take 1 mg by mouth daily.  . Blood Glucose Calibration (TRUE METRIX LEVEL 1) Low SOLN   . cetirizine (ZYRTEC ALLERGY) 10 MG tablet Take 1 tablet (10 mg total) by mouth daily.  . clobetasol cream (TEMOVATE) 8.67 % Apply 1 application topically 2 (two) times daily.  Marland Kitchen EPINEPHrine 0.3 mg/0.3 mL IJ SOAJ injection Inject 0.3 mg into the muscle as needed for anaphylaxis.  . fexofenadine (ALLEGRA) 180 MG tablet Take 2 tablets by mouth every morning.  Marland Kitchen  FLUoxetine (PROZAC) 10 MG capsule Take 10 mg by mouth daily.  . fluPHENAZine (PROLIXIN) 2.5 MG tablet Take 1 tablet (2.5 mg total)  by mouth at bedtime.  . fluticasone (FLONASE) 50 MCG/ACT nasal spray Place 2 sprays into both nostrils daily.  Marland Kitchen gabapentin (NEURONTIN) 300 MG capsule Take 1 capsule (300 mg total) by mouth 3 (three) times daily.  . hydrocortisone 2.5 % lotion Apply topically 2 (two) times daily.  . hydrOXYzine (ATARAX/VISTARIL) 25 MG tablet Take 1 tablet (25 mg total) by mouth every 8 (eight) hours as needed.  . Insulin Pen Needle (PEN NEEDLES) 30G X 8 MM MISC 1 Device by Does not apply route daily.  Marland Kitchen liraglutide (VICTOZA) 18 MG/3ML SOPN Inject 0.3 mLs (1.8 mg total) into the skin daily.  . Loperamide HCl (IMODIUM A-D PO) Take by mouth.  . metoprolol succinate (TOPROL-XL) 25 MG 24 hr tablet Take 1 tablet (25 mg total) by mouth daily. Take with or immediately following a meal.  . Multiple Vitamins-Minerals (ALIVE MULTI-VITAMIN PO) Take by mouth daily in the afternoon.  Marland Kitchen oxyCODONE-acetaminophen (PERCOCET) 10-325 MG tablet Take 1 tablet by mouth 3 (three) times daily as needed.  . predniSONE (DELTASONE) 5 MG tablet Take 2 tablets by mouth once daily on days 7-9, then 1 tablet by mouth once daily on days 10-12  . Probiotic Product (PROBIOTIC DAILY PO) Take by mouth. probiogen probiotic daily  . topiramate (TOPAMAX) 100 MG tablet Take 100 mg by mouth daily.  Marland Kitchen triamcinolone cream (KENALOG) 0.1 % Apply 1 application topically 2 (two) times daily.  Marland Kitchen triamcinolone cream (KENALOG) 0.5 % Apply 1 application topically 3 (three) times daily.  . [DISCONTINUED] EPINEPHrine 0.3 mg/0.3 mL IJ SOAJ injection Inject 0.3 mLs (0.3 mg total) into the muscle as needed for anaphylaxis.  . [DISCONTINUED] fluPHENAZine (PROLIXIN) 2.5 MG tablet Take 2.5 mg by mouth at bedtime.    Allergies  Allergen Reactions  . Cephalexin Swelling and Rash  . Hctz [Hydrochlorothiazide] Rash and Swelling    Mild she says   . Lisinopril Hives and Other (See Comments)    Headache, Low BP, angioedema    . Losartan Hives and Swelling    Angioedema  . Penicillins Rash    Has patient had a PCN reaction causing immediate rash, facial/tongue/throat swelling, SOB or lightheadedness with hypotension: Yes Has patient had a PCN reaction causing severe rash involving mucus membranes or skin necrosis: No Has patient had a PCN reaction that required hospitalization: No Has patient had a PCN reaction occurring within the last 10 years: No If all of the above answers are "NO", then may proceed with Cephalosporin use.   . Bee Venom     Swelling   . Metformin And Related     Rash    Recent Results (from the past 2160 hour(s))  Urinalysis, Routine w reflex microscopic     Status: None   Collection Time: 05/21/20  9:23 AM  Result Value Ref Range   Color, Urine YELLOW YELLOW   APPearance CLEAR CLEAR   Specific Gravity, Urine 1.018 1.001 - 1.03   pH 5.5 5.0 - 8.0   Glucose, UA NEGATIVE NEGATIVE   Bilirubin Urine NEGATIVE NEGATIVE   Ketones, ur NEGATIVE NEGATIVE   Hgb urine dipstick NEGATIVE NEGATIVE   Protein, ur NEGATIVE NEGATIVE   Nitrite NEGATIVE NEGATIVE   Leukocytes,Ua NEGATIVE NEGATIVE   WBC, UA NONE SEEN 0 - 5 /HPF   RBC / HPF NONE SEEN 0 - 2 /HPF   Squamous Epithelial / LPF NONE SEEN < OR = 5 /HPF   Bacteria, UA NONE SEEN NONE SEEN /HPF   Hyaline Cast NONE  SEEN NONE SEEN /LPF  TSH     Status: None   Collection Time: 05/21/20  9:23 AM  Result Value Ref Range   TSH 1.23 0.35 - 4.50 uIU/mL  CBC with Differential/Platelet     Status: Abnormal   Collection Time: 05/21/20  9:23 AM  Result Value Ref Range   WBC 6.2 4.0 - 10.5 K/uL   RBC 4.14 3.87 - 5.11 Mil/uL   Hemoglobin 12.1 12.0 - 15.0 g/dL   HCT 34.8 (L) 36 - 46 %   MCV 84.1 78.0 - 100.0 fl   MCHC 34.7 30.0 - 36.0 g/dL   RDW 14.1 11.5 - 15.5 %   Platelets 184.0 150 - 400 K/uL   Neutrophils Relative % 54.9 43 - 77 %   Lymphocytes Relative 32.5 12  - 46 %   Monocytes Relative 9.3 3 - 12 %   Eosinophils Relative 2.5 0 - 5 %   Basophils Relative 0.8 0 - 3 %   Neutro Abs 3.4 1.4 - 7.7 K/uL   Lymphs Abs 2.0 0.7 - 4.0 K/uL   Monocytes Absolute 0.6 0.1 - 1.0 K/uL   Eosinophils Absolute 0.2 0.0 - 0.7 K/uL   Basophils Absolute 0.0 0.0 - 0.1 K/uL  Lipid panel     Status: None   Collection Time: 05/21/20  9:23 AM  Result Value Ref Range   Cholesterol 127 0 - 200 mg/dL    Comment: ATP III Classification       Desirable:  < 200 mg/dL               Borderline High:  200 - 239 mg/dL          High:  > = 240 mg/dL   Triglycerides 52.0 0 - 149 mg/dL    Comment: Normal:  <150 mg/dLBorderline High:  150 - 199 mg/dL   HDL 51.40 >39.00 mg/dL   VLDL 10.4 0.0 - 40.0 mg/dL   LDL Cholesterol 65 0 - 99 mg/dL   Total CHOL/HDL Ratio 2     Comment:                Men          Women1/2 Average Risk     3.4          3.3Average Risk          5.0          4.42X Average Risk          9.6          7.13X Average Risk          15.0          11.0                       NonHDL 75.17     Comment: NOTE:  Non-HDL goal should be 30 mg/dL higher than patient's LDL goal (i.e. LDL goal of < 70 mg/dL, would have non-HDL goal of < 100 mg/dL)  Comprehensive metabolic panel     Status: Abnormal   Collection Time: 05/21/20  9:23 AM  Result Value Ref Range   Sodium 139 135 - 145 mEq/L   Potassium 4.1 3.5 - 5.1 mEq/L   Chloride 106 96 - 112 mEq/L   CO2 25 19 - 32 mEq/L   Glucose, Bld 109 (H) 70 - 99 mg/dL   BUN 12 6 - 23 mg/dL   Creatinine, Ser 1.03 0.40 - 1.20 mg/dL  Total Bilirubin 0.4 0.2 - 1.2 mg/dL   Alkaline Phosphatase 65 39 - 117 U/L   AST 15 0 - 37 U/L   ALT 18 0 - 35 U/L   Total Protein 6.3 6.0 - 8.3 g/dL   Albumin 4.0 3.5 - 5.2 g/dL   GFR 68.10 >60.00 mL/min   Calcium 8.9 8.4 - 10.5 mg/dL  Group A Strep by PCR     Status: None   Collection Time: 08/15/20  3:37 PM   Specimen: Throat; Sterile Swab  Result Value Ref Range   Group A Strep by PCR NOT DETECTED  NOT DETECTED    Comment: Performed at [facility], [address] [place], [place] [address]  SARS CORONAVIRUS 2 (TAT 6-24 HRS) Nasopharyngeal Nasopharyngeal Swab     Status: None   Collection Time: 08/15/20  3:37 PM   Specimen: Nasopharyngeal Swab  Result Value Ref Range   SARS Coronavirus 2 NEGATIVE NEGATIVE    Comment: (NOTE) SARS-CoV-2 target nucleic acids are NOT DETECTED.  The SARS-CoV-2 RNA is generally detectable in upper and lower respiratory specimens during the acute phase of infection. Negative results do not preclude SARS-CoV-2 infection, do not rule out co-infections with other pathogens, and should not be used as the sole basis for treatment or other patient management decisions. Negative results must be combined with clinical observations, patient history, and epidemiological information. The expected result is Negative.  Fact Sheet for Patients: SugarRoll.be  Fact Sheet for Healthcare Providers: https://www.woods-mathews.com/  This test is not yet approved or cleared by the [place] FDA and  has been authorized for detection and/or diagnosis of SARS-CoV-2 by FDA under an Emergency Use Authorization (EUA). This EUA will remain  in effect (meaning this test can be used) for the duration of the COVID-19 declaration under Se ction 564(b)(1) of the Act, 21 U.S.C. section 360bbb-3(b)(1), unless the authorization is terminated or revoked sooner.  Performed at [facility], [place] [address] [place], [place] [address]    Objective  Body mass index is 43.86 kg/m. Wt Readings from Last 3 Encounters:  08/17/20 247 lb 9.6 oz (112.3 kg)  08/15/20 245 lb (111.1 kg)  07/24/20 235 lb 3.2 oz (106.7 kg)   Temp Readings from Last 3 Encounters:  08/17/20 98.4 F (36.9 C) (Oral)  08/15/20 98.8 F (37.1 C) (Oral)  07/24/20 98.8 F (37.1 C) (Oral)   BP Readings from Last 3 Encounters:  08/17/20 120/78   08/15/20 134/72  07/24/20 102/70   Pulse Readings from Last 3 Encounters:  08/17/20 78  08/15/20 84  07/24/20 94    Physical Exam Vitals and nursing note reviewed.  Constitutional:      Appearance: Normal appearance. She is well-developed and well-groomed. She is morbidly obese.  HENT:     Head: Normocephalic and atraumatic.  Eyes:     Conjunctiva/sclera: Conjunctivae normal.     Pupils: Pupils are equal, round, and reactive to light.  Cardiovascular:     Rate and Rhythm: Normal rate and regular rhythm.     Heart sounds: Normal heart sounds. No murmur heard.   Pulmonary:     Effort: Pulmonary effort is normal.     Breath sounds: Normal breath sounds.  Skin:    General: Skin is warm and dry.     Comments: Hives to trunk   Neurological:     General: No focal deficit present.     Mental Status: She is alert and oriented to person, place, and  time. Mental status is at baseline.     Gait: Gait normal.  Psychiatric:        Attention and Perception: Attention and perception normal.        Mood and Affect: Mood and affect normal.        Speech: Speech normal.        Behavior: Behavior normal. Behavior is cooperative.        Thought Content: Thought content normal.        Cognition and Memory: Cognition and memory normal.        Judgment: Judgment normal.     Assessment  Plan  Angioedema, subsequent encounter - Plan: Glucose 6 phosphate dehydrogenase Consider xolair  Referral [facility] allergy pending  Dysphagia, unspecified type - Plan: Ambulatory referral to Gastroenterology Dr. Vicente Males needs repeat EGD with dilatation had this at 54 y.o   Gastroesophageal reflux disease, unspecified whether esophagitis present - Plan: Ambulatory referral to Gastroenterology  Mood disorder ([place]) - Plan: fluPHENAZine (PROLIXIN) 2.5 MG tablet  Folliculitis - Plan: mupirocin ointment (BACTROBAN) 2 % to scalp   Elevated liver enzymes - Plan: Comprehensive metabolic panel  AKI (acute kidney  injury) ([place]) - Plan: Comprehensive metabolic panel  Anaphylaxis, initial encounter - Plan: EPINEPHrine 0.3 mg/0.3 mL IJ SOAJ injection, Glucose 6 phosphate dehydrogenase  Diarrhea, unspecified type Prn immodium   Abnormal gait With right knee and right low back pain f/u pain clinic   Morbid obesity with BMI of 40.0-44.9, adult ([place])  rec healthy diet and exercise   Hm  HM Declines flu shotallergic Tdap utd due 7/13/2020will do in future at f/u [place] 2/2 consider booster  Allergic to pna vaccine Consider shingris Consider shingrix but given allergies use caution Not immuneMMR, hep B titer in futureuse caution though with h/o allergies with all vaccines covid 19 vaccine2/2 915/21 house calls visit A1c 6.1   h/o supracervical hysterectomy ([facility]), pap 02/29/16 negative no comment HPV, another pap 06/23/17 neg pap neg HPV  -h/o LEEP 2006  mammo 08/04/19 negativeordered   Colonoscopy7/10/20 normal f/u in 10 years  Echo Dr.Kowalski  -nl LV function EF 60% moderate mitral insuff and mild tricuspid insufficiency treadmill stress test neg 07/30/13 -referred again today   HIV neg 02/04/16  Ob/gyn appt pending bladder leakage [facility]pped PT w/o help worsen sneezing or coughing) -Dr. Georgianne Fick pt wants surgeryas of 04/20/20 sx's better and no surgery indicated   Consider hep C testing  Consider referral repeat sleep study h/o OSA  Provider: Dr. Olivia Mackie McLean-Scocuzza-Internal Medicine

## 2020-08-17 NOTE — Telephone Encounter (Signed)
Noted  

## 2020-08-17 NOTE — Addendum Note (Signed)
Addended by: Tor Netters I on: 08/17/2020 02:24 PM   Modules accepted: Orders

## 2020-08-17 NOTE — Patient Instructions (Addendum)
Take allegra 2 pills in the am can take up to 4 pills in the am  Zyrtec 2 pills in the pm can take up to 4 pills in the pm  (can take 4x the regular dose)  Benadryl 12.5-25 mg daily as needed  Hydroxyzine as needed   Continue prednisone   If you have tongue, face swelling shortness of breath go the Emergency Room or call 911 Husband can drive you   Angioedema Angioedema is the sudden swelling of tissue in the body. Angioedema can affect any part of the body, but it most often affects the deeper parts of the skin, causing red, itchy patches (hives) to appear over the affected area. It often begins during the night and is found in the morning. Depending on the cause, angioedema may happen:  Only once.  Several times. It may come back in unpredictable patterns.  Repeatedly for several years. Over time, it may gradually stop coming back. Angioedema can be life-threatening if it affects the air passages that you breathe through. What are the causes? This condition may be caused by:  Foods, such as milk, eggs, shellfish, wheat, or nuts.  Certain medicines, such as ACE inhibitors, antibiotics, nonsteroidal anti-inflammatory drugs, birth control pills, or dyes used in X-rays.  Insect stings.  Infections. Angioedema can be inherited, and episodes can be triggered by:  Mild injury.  Dental work.  Surgery.  Stress.  Sudden changes in temperature.  Exercise. In some cases, the cause of this condition is not known. What are the signs or symptoms? Symptoms of this condition depend on where the swelling happens. Symptoms may include:  Swollen skin.  Red, itchy patches of skin (hives).  Redness in the affected area.  Pain in the affected area.  Swollen lips or tongue.  Wheezing.  Breathing problems.  If your internal organs are involved, symptoms may also include:  Nausea.  Abdominal pain.  Vomiting.  Difficulty swallowing.  Difficulty passing urine. How is  this diagnosed? This condition may be diagnosed based on:  An exam of the affected area.  Your medical history.  Whether anyone in your family has had this condition before.  A review of any medicines you have been taking.  Tests, including: ? Allergy skin tests to see if the condition was caused by an allergic reaction. ? Blood tests to see if the condition was caused by a gene. ? Tests to check for underlying diseases that could cause the condition. How is this treated? Treatment for this condition depends on the cause. It may involve any of the following:  If something triggered the condition, making changes to keep it from triggering the condition again.  If the condition affects your breathing, having tubes placed in your airway to keep it open.  Taking medicines to treat symptoms or prevent future episodes. These may include: ? Antihistamines. ? Epinephrine injections. ? Steroids. If your condition is severe, you may need to be treated at the hospital. Angioedema usually gets better in 24-48 hours. Follow these instructions at home:  Take over-the-counter and prescription medicines only as told by your health care provider.  If you were given medicines for emergency allergy treatment, always carry them with you.  Wear a medical bracelet as told by your health care provider.  If something triggers your condition, avoid the trigger, if possible.  If your condition is inherited and you are thinking about having children, talk to your health care provider. It is important to discuss the risks of  passing on the condition to your children. Contact a health care provider if:  You have repeated episodes of angioedema.  Episodes of angioedema start to happen more often than they used to, even after you take steps to prevent them.  You have episodes of angioedema that are more severe than they have been before, even after you take steps to prevent them.  You are thinking  about having children. Get help right away if:  You have severe swelling of your mouth, tongue, or lips.  You have trouble breathing.  You have trouble swallowing.  You faint. This information is not intended to replace advice given to you by your health care provider. Make sure you discuss any questions you have with your health care provider. Document Revised: 09/04/2017 Document Reviewed: 04/01/2016 Elsevier Patient Education  2020 [employer].

## 2020-08-17 NOTE — Addendum Note (Signed)
Addended by: Orland Mustard on: 08/17/2020 05:28 PM   Modules accepted: Orders

## 2020-08-17 NOTE — Progress Notes (Signed)
Patient having lip/tongue swelling first thing in the morning, she had a balloon placed to stretch her throat but she states this has deflated and she is having trouble swallowing. Patient has had ongoing diarrhea for the last 29 days. Patient also having pressure with urination.

## 2020-08-20 LAB — HEPATITIS C ANTIBODY
Hepatitis C Ab: NONREACTIVE
SIGNAL TO CUT-OFF: 0.01 (ref <1.00)

## 2020-08-20 LAB — GLUCOSE 6 PHOSPHATE DEHYDROGENASE: G-6PDH: 15.7 U/g Hgb (ref 7.0–20.5)

## 2020-08-20 NOTE — Telephone Encounter (Signed)
Taken care of pt seen 08/17/20

## 2020-08-22 ENCOUNTER — Ambulatory Visit
Admission: RE | Admit: 2020-08-22 | Discharge: 2020-08-22 | Disposition: A | Discharge status: Home / Self Care | Payer: Medicare Other | Source: Ambulatory Visit | Attending: Internal Medicine | Admitting: Internal Medicine

## 2020-08-22 ENCOUNTER — Other Ambulatory Visit: Payer: Self-pay

## 2020-08-22 DIAGNOSIS — Z1231 Encounter for screening mammogram for malignant neoplasm of breast: Secondary | ICD-10-CM

## 2020-08-29 ENCOUNTER — Telehealth: Payer: Self-pay | Admitting: Internal Medicine

## 2020-08-29 NOTE — Telephone Encounter (Signed)
Faxed to [place] for prescription authorization for diabetes supplies and  1 year supply for Omega-3 Ethyl Esters capsules 1 gm 360 capsules faxed on 08-29-2020

## 2020-09-04 DIAGNOSIS — M170 Bilateral primary osteoarthritis of knee: Secondary | ICD-10-CM | POA: Diagnosis not present

## 2020-09-18 ENCOUNTER — Encounter: Admit: 2020-09-18 | Discharge: 2020-09-19 | Payer: MEDICARE

## 2020-09-18 DIAGNOSIS — T783XXA Angioneurotic edema, initial encounter: Principal | ICD-10-CM

## 2020-09-18 DIAGNOSIS — L509 Urticaria, unspecified: Principal | ICD-10-CM

## 2020-09-18 DIAGNOSIS — Z880 Allergy status to penicillin: Principal | ICD-10-CM

## 2020-09-18 MED ORDER — MONTELUKAST 10 MG TABLET
ORAL_TABLET | Freq: Every evening | ORAL | 0 refills | 30.00000 days | Status: CP
Start: 2020-09-18 — End: 2020-10-29

## 2020-09-20 DIAGNOSIS — M797 Fibromyalgia: Secondary | ICD-10-CM | POA: Diagnosis not present

## 2020-09-20 DIAGNOSIS — M47816 Spondylosis without myelopathy or radiculopathy, lumbar region: Secondary | ICD-10-CM | POA: Diagnosis not present

## 2020-09-20 DIAGNOSIS — G894 Chronic pain syndrome: Secondary | ICD-10-CM | POA: Diagnosis not present

## 2020-09-20 DIAGNOSIS — Z79891 Long term (current) use of opiate analgesic: Secondary | ICD-10-CM | POA: Diagnosis not present

## 2020-09-26 DIAGNOSIS — H524 Presbyopia: Secondary | ICD-10-CM | POA: Diagnosis not present

## 2020-10-01 ENCOUNTER — Ambulatory Visit (INDEPENDENT_AMBULATORY_CARE_PROVIDER_SITE_OTHER): Payer: Medicare Other

## 2020-10-01 VITALS — Ht 63.0 in | Wt 232.0 lb

## 2020-10-01 DIAGNOSIS — Z0000 Encounter for general adult medical examination without abnormal findings: Secondary | ICD-10-CM | POA: Diagnosis not present

## 2020-10-01 NOTE — Progress Notes (Addendum)
Subjective:   Heather Sweeney is a 54 y.o. female who presents for Medicare Annual (Subsequent) preventive examination.  Review of Systems    No ROS.  Medicare Wellness Virtual Visit.   Cardiac Risk Factors include: advanced age (>37men, >87 women);diabetes mellitus;hypertension     Objective:    Today's Vitals   10/01/20 1036  Weight: 232 lb (105.2 kg)  Height: 5\' 3"  (1.6 m)   Body mass index is 41.1 kg/m.  Advanced Directives 10/01/2020 08/15/2020 07/22/2020 07/14/2020 07/07/2019 04/15/2019 03/03/2019  Does Patient Have a Medical Advance Directive? No No No No No No No  Would patient like information on creating a medical advance directive? No - Patient declined - - - No - Patient declined - Yes (MAU/Ambulatory/Procedural Areas - Information given)    Current Medications (verified) Outpatient Encounter Medications as of 10/01/2020  Medication Sig   amLODipine (NORVASC) 5 MG tablet Take 1 tablet (5 mg total) by mouth daily.   clobetasol cream (TEMOVATE) [id number] % Apply 1 application topically 2 (two) times daily.   EPINEPHrine 0.3 mg/0.3 mL IJ SOAJ injection Inject 0.3 mg into the muscle as needed for anaphylaxis.   famotidine (PEPCID) 20 MG tablet Take 1 tablet (20 mg total) by mouth 2 (two) times daily.   FLUoxetine (PROZAC) 10 MG capsule Take 10 mg by mouth daily.   fluPHENAZine (PROLIXIN) 2.5 MG tablet Take 1 tablet (2.5 mg total) by mouth at bedtime.   gabapentin (NEURONTIN) 300 MG capsule Take 1 capsule (300 mg total) by mouth 3 (three) times daily.   metoprolol succinate (TOPROL-XL) 25 MG 24 hr tablet Take 1 tablet (25 mg total) by mouth daily. Take with or immediately following a meal.   oxyCODONE-acetaminophen (PERCOCET) 10-325 MG tablet Take 1 tablet by mouth 3 (three) times daily as needed.   Probiotic Product (PROBIOTIC DAILY PO) Take by mouth. probiogen probiotic daily   topiramate (TOPAMAX) 100 MG tablet Take 100 mg by mouth daily.   triamcinolone cream (KENALOG)  0.1 % Apply 1 application topically 2 (two) times daily.   triamcinolone cream (KENALOG) 0.5 % Apply 1 application topically 3 (three) times daily.   benztropine (COGENTIN) 1 MG tablet Take 1 mg by mouth daily. (Patient not taking: Reported on 10/01/2020)   Blood Glucose Calibration (TRUE METRIX LEVEL 1) Low SOLN    cetirizine (ZYRTEC ALLERGY) 10 MG tablet Take 1 tablet (10 mg total) by mouth daily. (Patient not taking: Reported on 10/01/2020)   fexofenadine (ALLEGRA) 180 MG tablet Take 2 tablets by mouth every morning.   fluticasone (FLONASE) 50 MCG/ACT nasal spray Place 2 sprays into both nostrils daily.   hydrocortisone 2.5 % lotion Apply topically 2 (two) times daily.   hydrOXYzine (ATARAX/VISTARIL) 25 MG tablet Take 1 tablet (25 mg total) by mouth every 8 (eight) hours as needed. (Patient not taking: Reported on 10/01/2020)   Insulin Pen Needle (PEN NEEDLES) 30G X 8 MM MISC 1 Device by Does not apply route daily.   liraglutide (VICTOZA) 18 MG/3ML SOPN Inject 0.3 mLs (1.8 mg total) into the skin daily.   Loperamide HCl (IMODIUM A-D PO) Take by mouth.   Multiple Vitamins-Minerals (ALIVE MULTI-VITAMIN PO) Take by mouth daily in the afternoon.   mupirocin ointment (BACTROBAN) 2 % Apply 1 application topically 3 (three) times daily. Scalp   [DISCONTINUED] predniSONE (DELTASONE) 5 MG tablet Take 2 tablets by mouth once daily on days 7-9, then 1 tablet by mouth once daily on days 10-12   No facility-administered encounter  medications on file as of 10/01/2020.    Allergies (verified) Cephalexin, Hctz [hydrochlorothiazide], Lisinopril, Losartan, Penicillins, Bee venom, and Metformin and related   History: Past Medical History:  Diagnosis Date   Allergic rhinitis    Bipolar disorder (HCC)    Chronic low back pain    CTS (carpal tunnel syndrome)    hands   Depression    Gallstones    GERD (gastroesophageal reflux disease)    History of prediabetes    Hypertension    Laceration of right  foot    Onychomycosis    OSA on CPAP    Overactive bladder    PVC's (premature ventricular contractions)    Seizures (HCC)    8th grade   Past Surgical History:  Procedure Laterality Date   ABDOMINAL HYSTERECTOMY  07/08/2011   ABDOMINAL HYSTERECTOMY     supracervical in 07/2011    CARPAL TUNNEL RELEASE     right hand 1996   CESAREAN SECTION     2012   CHOLECYSTECTOMY N/A 01/12/2018   Procedure: LAPAROSCOPIC CHOLECYSTECTOMY WITH INTRAOPERATIVE CHOLANGIOGRAM ERAS PATHWAY;  Surgeon: Johnathan Hausen, MD;  Location: [facility];  Service: General;  Laterality: N/A;   COLONOSCOPY WITH PROPOFOL N/A 04/15/2019   Procedure: COLONOSCOPY WITH PROPOFOL;  Surgeon: Jonathon Bellows, MD;  Location: [facility] ENDOSCOPY;  Service: Gastroenterology;  Laterality: N/A;   DILATION AND CURETTAGE OF UTERUS     04/2011 benign endometrial polyp squamous metaplasia, inflamm, blood, mucous, benign    FOOT SURGERY     Dr. Prudence Davidson    LEEP     2006   TUBAL LIGATION     Family History  Problem Relation Age of Onset   Cancer Father        esophageal    Hypertension Father    Heart failure Mother    Hypertension Mother    Early death Mother    Heart disease Mother        chf   Breast cancer Sister 45   Cancer Sister        breast   Depression Sister    Mental illness Sister    Asthma Son    Cancer Other        brain cancer   Social History   Socioeconomic History   Marital status: Married    Spouse name: Not on file   Number of children: Not on file   Years of education: Not on file   Highest education level: Not on file  Occupational History   Not on file  Tobacco Use   Smoking status: Never Smoker   Smokeless tobacco: Current User    Types: Snuff   Tobacco comment: Is in process of decreasing amount  Vaping Use   Vaping Use: Never used  Substance and Sexual Activity   Alcohol use: No    Alcohol/week: 0.0 standard drinks   Drug use: No   Sexual activity: Yes    Comment: men  Other Topics Concern    Not on file  Social History Narrative   Married    No guns    Wears seat belt    Safe in relationship    Never smoker    2 sons Gaspar Bidding ~10 and another son ~28 as of 10/2019   1 granson eli 1 y.o as of 10/2019   2 sisters    Unemployed       Social Determinants of Health   Financial Resource Strain: Low Risk    Difficulty of Paying  Living Expenses: Not hard at all  Food Insecurity: No Food Insecurity   Worried About Running Out of Food in the Last Year: Never true   Ran Out of Food in the Last Year: Never true  Transportation Needs: No Transportation Needs   Lack of Transportation (Medical): No   Lack of Transportation (Non-Medical): No  Physical Activity: Not on file  Stress: No Stress Concern Present   Feeling of Stress : Not at all  Social Connections: Unknown   Frequency of Communication with Friends and Family: Not on file   Frequency of Social Gatherings with Friends and Family: Not on file   Attends Religious Services: Not on Scientist, clinical (histocompatibility and immunogenetics) or Organizations: Not on file   Attends Banker Meetings: Not on file   Marital Status: Married    Tobacco Counseling Ready to quit: Not Answered Counseling given: Not Answered Comment: Is in process of decreasing amount   Clinical Intake:  Pre-visit preparation completed: No        Diabetes: No  How often do you need to have someone help you when you read instructions, pamphlets, or other written materials from your doctor or pharmacy?: 1 - Never  Nutrition Risk Assessment: Has the patient had any N/V/D within the last 2 weeks?  No  Does the patient have any non-healing wounds?  No  Has the patient had any unintentional weight loss or weight gain?  No   Diabetes: If diabetic, was a CBG obtained today?  No  Did the patient bring in their glucometer from home?  No  How often do you monitor your CBG's? Every other day.   Financial Strains and Diabetes Management: Are you having any  financial strains with the device, your supplies or your medication? No.  Patient seen by Chronic Care Management for management of their diabetes?  Yes  Would the patient like to be referred to a Nutritionist or for Diabetic Management?  No   Interpreter Needed?: No      Activities of Daily Living In your present state of health, do you have any difficulty performing the following activities: 10/01/2020  Hearing? N  Vision? N  Difficulty concentrating or making decisions? N  Walking or climbing stairs? N  Dressing or bathing? N  Doing errands, shopping? N  Preparing Food and eating ? N  Using the Toilet? N  In the past six months, have you accidently leaked urine? N  Do you have problems with loss of bowel control? N  Managing your Medications? N  Housekeeping or managing your Housekeeping? N  Some recent data might be hidden    Patient Care Team: McLean-Scocuzza, Pasty Spillers, MD as PCP - General (Internal Medicine) Lourena Simmonds, RPH-CPP as Pharmacist (Pharmacist)  Indicate any recent Medical Services you may have received from other than [facility] providers in the past year (date may be approximate).     Assessment:   This is a routine wellness examination for [place].  I connected with Shanese today by telephone and verified that I am speaking with the correct person using two identifiers. Location patient: home Location provider: work Persons participating in the virtual visit: patient, Engineer, civil (consulting).    I discussed the limitations, risks, security and privacy concerns of performing an evaluation and management service by telephone and the availability of in person appointments. The patient expressed understanding and verbally consented to this telephonic visit.    Interactive audio and video telecommunications were attempted between this provider  and patient, however failed, due to patient having technical difficulties OR patient did not have access to video capability.  We  continued and completed visit with audio only.  Some vital signs may be absent or patient reported.   Hearing/Vision screen  Hearing Screening   125Hz  250Hz  500Hz  1000Hz  2000Hz  3000Hz  4000Hz  6000Hz  8000Hz   Right ear:           Left ear:           Comments: Patient is able to hear conversational tones without difficulty.  No issues reported.  Vision Screening Comments: Followed by [facility] Wears corrective lenses No retinopathy reported Visual acuity not assessed, virtual visit.  They have seen their ophthalmologist in the last 12 months.   Dietary issues and exercise activities discussed:  Regular diet Good water intake  Goal:  Follow up with Primary Care Provider as needed.   Depression Screen PHQ 2/9 Scores 10/01/2020 04/20/2020 01/10/2020 10/20/2019 07/07/2019 05/12/2019 08/31/2018  PHQ - 2 Score 0 0 0 0 0 0 0    Fall Risk Fall Risk  10/01/2020 08/17/2020 07/24/2020 04/20/2020 01/10/2020  Falls in the past year? (No Data) 1 0 0 0  Comment No falls since last reported 3 weeks ago - - - -  Number falls in past yr: - 1 0 0 0  Injury with Fall? - 1 0 0 0  Risk for fall due to : - History of fall(s) - - -  Follow up - Falls evaluation completed Falls evaluation completed Falls evaluation completed Falls evaluation completed    [place]: Handrails in use when climbing stairs? Yes Home free of loose throw rugs in walkways, pet beds, electrical cords, etc? Yes  Adequate lighting in your home to reduce risk of falls? Yes   ASSISTIVE DEVICES UTILIZED TO PREVENT FALLS: Use of a cane, walker or w/c? No   TIMED UP AND GO: Was the test performed? No .   Cognitive Function:     6CIT Screen 10/01/2020 07/07/2019  What Year? 0 points 0 points  What month? 0 points 0 points  What time? 0 points 0 points  Count back from 20 0 points 0 points  Months in reverse 4 points 0 points  Repeat phrase 4 points -  Total Score 8 -     Immunizations Immunization History  Administered Date(s) Administered   Influenza,inj,Quad PF,6+ Mos 07/12/2016, 06/09/2019   Influenza-Unspecified 05/06/2020   PFIZER SARS-COV-2 Vaccination 01/17/2020, 02/07/2020   Pneumococcal Polysaccharide-23 07/12/2016   Tdap 04/17/2009    TDAP status: Due, Education has been provided regarding the importance of this vaccine. Advised may receive this vaccine at local pharmacy or Health Dept. Aware to provide a copy of the vaccination record if obtained from local pharmacy or Health Dept. Verbalized acceptance and understanding. Deferred.   Health Maintenance Health Maintenance  Topic Date Due   TETANUS/TDAP  04/18/2019   COVID-19 Vaccine (3 - Booster for Pfizer series) 08/09/2020   MAMMOGRAM  08/22/2022   PAP SMEAR-Modifier  11/28/2022   COLONOSCOPY (Pts 45-106yrs Insurance coverage will need to be confirmed)  04/14/2029   INFLUENZA VACCINE  Completed   Hepatitis C Screening  Completed   HIV Screening  Completed   URINE MICROALBUMIN  Discontinued   Colorectal cancer screening: Type of screening: Colonoscopy. Completed 04/15/19. Repeat every 10 years  Mammogram status: Completed 08/22/20. Repeat every year.  Lung Cancer Screening: (Low Dose CT Chest recommended if Age 6-80 years, 30 pack-year  currently smoking OR have quit w/in 15years.) does not qualify.   Hepatitis C Screening: Completed 08/17/20.  Vision Screening: Recommended annual ophthalmology exams for early detection of glaucoma and other disorders of the eye. Is the patient up to date with their annual eye exam?  Yes  Who is the provider or what is the name of the office in which the patient attends annual eye exams? [facility]  Dental Screening: Recommended annual dental exams for proper oral hygiene. Dentures.   Community Resource Referral / Chronic Care Management: CRR required this visit?  No   CCM required this visit?  No      Plan:   Keep all routine  maintenance appointments.   Follow up 10/25/20 @ 10:30  I have personally reviewed and noted the following in the patient's chart:   Medical and social history Use of alcohol, tobacco or illicit drugs  Current medications and supplements Functional ability and status Nutritional status Physical activity Advanced directives List of other physicians Hospitalizations, surgeries, and ER visits in previous 12 months Vitals Screenings to include cognitive, depression, and falls Referrals and appointments  In addition, I have reviewed and discussed with patient certain preventive protocols, quality metrics, and best practice recommendations. A written personalized care plan for preventive services as well as general preventive health recommendations were provided to patient.     OBrien-Blaney, Bright Spielmann L, LPN   [id number]    I have reviewed the above information and agree with above.   Deborra Medina, MD

## 2020-10-01 NOTE — Patient Instructions (Addendum)
Heather Sweeney , Thank you for taking time to come for your Medicare Wellness Visit. I appreciate your ongoing commitment to your health goals. Please review the following plan we discussed and let me know if I can assist you in the future.   These are the goals we discussed:  Goals:  Follow up with Primary Care Provider as needed.    This is a list of the screening recommended for you and due dates:  Health Maintenance  Topic Date Due  . Tetanus Vaccine  04/18/2019  . COVID-19 Vaccine (3 - Booster for Pfizer series) 08/09/2020  . Mammogram  08/22/2022  . Pap Smear  11/28/2022  . Colon Cancer Screening  04/14/2029  . Flu Shot  Completed  .  Hepatitis C: One time screening is recommended by Center for Disease Control  (CDC) for  adults born from [birth year] through 1963.   Completed  . HIV Screening  Completed  . Urine Protein Check  Discontinued    Immunizations Immunization History  Administered Date(s) Administered  . Influenza,inj,Quad PF,6+ Mos 07/12/2016, 06/09/2019  . Influenza-Unspecified 05/06/2020  . PFIZER SARS-COV-2 Vaccination 01/17/2020, 02/07/2020  . Pneumococcal Polysaccharide-23 07/12/2016  . Tdap 04/17/2009   Keep all routine maintenance appointments.   Follow up 10/25/20 @ 10:30  Advanced directives: not yet completed  Follow up in one year for your annual wellness visit.   Preventive Care 40-64 Years, Female Preventive care refers to lifestyle choices and visits with your health care provider that can promote health and wellness. What does preventive care include?  A yearly physical exam. This is also called an annual well check.  Dental exams once or twice a year.  Routine eye exams. Ask your health care provider how often you should have your eyes checked.  Personal lifestyle choices, including:  Daily care of your teeth and gums.  Regular physical activity.  Eating a healthy diet.  Avoiding tobacco and drug use.  Limiting alcohol  use.  Practicing safe sex.  Taking low-dose aspirin daily starting at age 46.  Taking vitamin and mineral supplements as recommended by your health care provider. What happens during an annual well check? The services and screenings done by your health care provider during your annual well check will depend on your age, overall health, lifestyle risk factors, and family history of disease. Counseling  Your health care provider may ask you questions about your:  Alcohol use.  Tobacco use.  Drug use.  Emotional well-being.  Home and relationship well-being.  Sexual activity.  Eating habits.  Work and work Statistician.  Method of birth control.  Menstrual cycle.  Pregnancy history. Screening  You may have the following tests or measurements:  Height, weight, and BMI.  Blood pressure.  Lipid and cholesterol levels. These may be checked every 5 years, or more frequently if you are over 64 years old.  Skin check.  Lung cancer screening. You may have this screening every year starting at age 76 if you have a 30-pack-year history of smoking and currently smoke or have quit within the past 15 years.  Fecal occult blood test (FOBT) of the stool. You may have this test every year starting at age 23.  Flexible sigmoidoscopy or colonoscopy. You may have a sigmoidoscopy every 5 years or a colonoscopy every 10 years starting at age 102.  Hepatitis C blood test.  Hepatitis B blood test.  Sexually transmitted disease (STD) testing.  Diabetes screening. This is done by checking your blood sugar (glucose) after  you have not eaten for a while (fasting). You may have this done every 1-3 years.  Mammogram. This may be done every 1-2 years. Talk to your health care provider about when you should start having regular mammograms. This may depend on whether you have a family history of breast cancer.  BRCA-related cancer screening. This may be done if you have a family history of  breast, ovarian, tubal, or peritoneal cancers.  Pelvic exam and Pap test. This may be done every 3 years starting at age 47. Starting at age 36, this may be done every 5 years if you have a Pap test in combination with an HPV test.  Bone density scan. This is done to screen for osteoporosis. You may have this scan if you are at high risk for osteoporosis. Discuss your test results, treatment options, and if necessary, the need for more tests with your health care provider. Vaccines  Your health care provider may recommend certain vaccines, such as:  Influenza vaccine. This is recommended every year.  Tetanus, diphtheria, and acellular pertussis (Tdap, Td) vaccine. You may need a Td booster every 10 years.  Zoster vaccine. You may need this after age 54.  Pneumococcal 13-valent conjugate (PCV13) vaccine. You may need this if you have certain conditions and were not previously vaccinated.  Pneumococcal polysaccharide (PPSV23) vaccine. You may need one or two doses if you smoke cigarettes or if you have certain conditions. Talk to your health care provider about which screenings and vaccines you need and how often you need them. This information is not intended to replace advice given to you by your health care provider. Make sure you discuss any questions you have with your health care provider. Document Released: 10/19/2015 Document Revised: 06/11/2016 Document Reviewed: 07/24/2015 Elsevier Interactive Patient Education  2017 [place] Prevention in the Home Falls can cause injuries. They can happen to people of all ages. There are many things you can do to make your home safe and to help prevent falls. What can I do on the outside of my home?  Regularly fix the edges of walkways and driveways and fix any cracks.  Remove anything that might make you trip as you walk through a door, such as a raised step or threshold.  Trim any bushes or trees on the path to your  home.  Use bright outdoor lighting.  Clear any walking paths of anything that might make someone trip, such as rocks or tools.  Regularly check to see if handrails are loose or broken. Make sure that both sides of any steps have handrails.  Any raised decks and porches should have guardrails on the edges.  Have any leaves, snow, or ice cleared regularly.  Use sand or salt on walking paths during winter.  Clean up any spills in your garage right away. This includes oil or grease spills. What can I do in the bathroom?  Use night lights.  Install grab bars by the toilet and in the tub and shower. Do not use towel bars as grab bars.  Use non-skid mats or decals in the tub or shower.  If you need to sit down in the shower, use a plastic, non-slip stool.  Keep the floor dry. Clean up any water that spills on the floor as soon as it happens.  Remove soap buildup in the tub or shower regularly.  Attach bath mats securely with double-sided non-slip rug tape.  Do not have throw rugs and  other things on the floor that can make you trip. What can I do in the bedroom?  Use night lights.  Make sure that you have a light by your bed that is easy to reach.  Do not use any sheets or blankets that are too big for your bed. They should not hang down onto the floor.  Have a firm chair that has side arms. You can use this for support while you get dressed.  Do not have throw rugs and other things on the floor that can make you trip. What can I do in the kitchen?  Clean up any spills right away.  Avoid walking on wet floors.  Keep items that you use a lot in easy-to-reach places.  If you need to reach something above you, use a strong step stool that has a grab bar.  Keep electrical cords out of the way.  Do not use floor polish or wax that makes floors slippery. If you must use wax, use non-skid floor wax.  Do not have throw rugs and other things on the floor that can make you  trip. What can I do with my stairs?  Do not leave any items on the stairs.  Make sure that there are handrails on both sides of the stairs and use them. Fix handrails that are broken or loose. Make sure that handrails are as long as the stairways.  Check any carpeting to make sure that it is firmly attached to the stairs. Fix any carpet that is loose or worn.  Avoid having throw rugs at the top or bottom of the stairs. If you do have throw rugs, attach them to the floor with carpet tape.  Make sure that you have a light switch at the top of the stairs and the bottom of the stairs. If you do not have them, ask someone to add them for you. What else can I do to help prevent falls?  Wear shoes that:  Do not have high heels.  Have rubber bottoms.  Are comfortable and fit you well.  Are closed at the toe. Do not wear sandals.  If you use a stepladder:  Make sure that it is fully opened. Do not climb a closed stepladder.  Make sure that both sides of the stepladder are locked into place.  Ask someone to hold it for you, if possible.  Clearly mark and make sure that you can see:  Any grab bars or handrails.  First and last steps.  Where the edge of each step is.  Use tools that help you move around (mobility aids) if they are needed. These include:  Canes.  Walkers.  Scooters.  Crutches.  Turn on the lights when you go into a dark area. Replace any light bulbs as soon as they burn out.  Set up your furniture so you have a clear path. Avoid moving your furniture around.  If any of your floors are uneven, fix them.  If there are any pets around you, be aware of where they are.  Review your medicines with your doctor. Some medicines can make you feel dizzy. This can increase your chance of falling. Ask your doctor what other things that you can do to help prevent falls. This information is not intended to replace advice given to you by your health care provider. Make  sure you discuss any questions you have with your health care provider. Document Released: 07/19/2009 Document Revised: 02/28/2016 Document Reviewed: 10/27/2014 Elsevier Interactive  Patient Education  2017 [employer].

## 2020-10-09 ENCOUNTER — Ambulatory Visit: Admit: 2020-10-09 | Discharge: 2020-10-10 | Payer: MEDICARE

## 2020-10-09 DIAGNOSIS — T783XXD Angioneurotic edema, subsequent encounter: Principal | ICD-10-CM

## 2020-10-09 DIAGNOSIS — L508 Other urticaria: Principal | ICD-10-CM

## 2020-10-09 DIAGNOSIS — L501 Idiopathic urticaria: Secondary | ICD-10-CM | POA: Diagnosis not present

## 2020-10-09 DIAGNOSIS — L299 Pruritus, unspecified: Secondary | ICD-10-CM | POA: Diagnosis not present

## 2020-10-10 ENCOUNTER — Other Ambulatory Visit: Payer: Self-pay

## 2020-10-10 ENCOUNTER — Ambulatory Visit (INDEPENDENT_AMBULATORY_CARE_PROVIDER_SITE_OTHER): Payer: Medicare Other | Admitting: Gastroenterology

## 2020-10-10 ENCOUNTER — Encounter: Payer: Self-pay | Admitting: Gastroenterology

## 2020-10-10 VITALS — BP 114/77 | HR 80 | Temp 98.5°F | Ht 63.0 in | Wt 238.0 lb

## 2020-10-10 DIAGNOSIS — R1310 Dysphagia, unspecified: Secondary | ICD-10-CM | POA: Diagnosis not present

## 2020-10-10 DIAGNOSIS — K2100 Gastro-esophageal reflux disease with esophagitis, without bleeding: Secondary | ICD-10-CM

## 2020-10-10 MED ORDER — FAMOTIDINE 40 MG PO TABS: 40.0000 mg | ORAL_TABLET | Freq: Every day | ORAL | 3 refills | Status: DC

## 2020-10-10 NOTE — Progress Notes (Signed)
Jonathon Bellows MD, MRCP(U.K) [address]  [place]  [place], [place] [address]  Main: [phone]  Fax: [phone]   Gastroenterology Consultation  Referring Provider:     McLean-Scocuzza, Olivia Mackie * Primary Care Physician:  McLean-Scocuzza, Nino Glow, MD Primary Gastroenterologist:  Dr. Jonathon Bellows  Reason for Consultation:    Dysphagia        HPI:   Heather Sweeney is a 55 y.o. y/o female referred for consultation & management  by Dr. Terese Door, Nino Glow, MD.   She is here today to see me for dysphagia.  Going on for many months if not longer.  Similar issue many years back and she had a endoscopy and dilation of her esophagus.  She does have a history of heartburn which she uses Tums as needed.  She uses snuff which she puts underneath her lower lip for many years.  Her father had a history of esophageal cancer.  He was a non-smoker.  The patient denies any weight loss.  Liquids are not affected.  Feels like a knot in the center of her chest.  04/15/2019: colonoscopy :normal   Past Medical History:  Diagnosis Date  . Allergic rhinitis   . Bipolar disorder ([place])   . Chronic low back pain   . CTS (carpal tunnel syndrome)    hands  . Depression   . Gallstones   . GERD (gastroesophageal reflux disease)   . History of prediabetes   . Hypertension   . Laceration of right foot   . Onychomycosis   . OSA on CPAP   . Overactive bladder   . PVC's (premature ventricular contractions)   . Seizures ([place])    8th grade    Past Surgical History:  Procedure Laterality Date  . ABDOMINAL HYSTERECTOMY  07/08/2011  . ABDOMINAL HYSTERECTOMY     supracervical in 07/2011   . CARPAL TUNNEL RELEASE     right hand 1996  . CESAREAN SECTION     2012  . CHOLECYSTECTOMY N/A 01/12/2018   Procedure: LAPAROSCOPIC CHOLECYSTECTOMY WITH INTRAOPERATIVE CHOLANGIOGRAM ERAS PATHWAY;  Surgeon: Johnathan Hausen, MD;  Location: [facility] ORS;  Service: General;  Laterality: N/A;  . COLONOSCOPY WITH  PROPOFOL N/A 04/15/2019   Procedure: COLONOSCOPY WITH PROPOFOL;  Surgeon: Jonathon Bellows, MD;  Location: [facility] ENDOSCOPY;  Service: Gastroenterology;  Laterality: N/A;  . DILATION AND CURETTAGE OF UTERUS     04/2011 benign endometrial polyp squamous metaplasia, inflamm, blood, mucous, benign   . FOOT SURGERY     Dr. Prudence Davidson   . LEEP     2006  . TUBAL LIGATION      Prior to Admission medications   Medication Sig Start Date End Date Taking? Authorizing Provider  amLODipine (NORVASC) 5 MG tablet Take 1 tablet (5 mg total) by mouth daily. 04/20/20 04/20/21  McLean-Scocuzza, Nino Glow, MD  benztropine (COGENTIN) 1 MG tablet Take 1 mg by mouth daily. Patient not taking: Reported on 10/01/2020 08/10/20   [provider]  Blood Glucose Calibration (TRUE METRIX LEVEL 1) Low SOLN  01/12/20   [provider]  cetirizine (ZYRTEC ALLERGY) 10 MG tablet Take 1 tablet (10 mg total) by mouth daily. Patient not taking: Reported on 10/01/2020 07/14/20   Coral Spikes, DO  clobetasol cream (TEMOVATE) [id number] % Apply 1 application topically 2 (two) times daily.    [provider]  EPINEPHrine 0.3 mg/0.3 mL IJ SOAJ injection Inject 0.3 mg into the muscle as needed for anaphylaxis. 08/17/20   McLean-Scocuzza,  Nino Glow, MD  famotidine (PEPCID) 20 MG tablet Take 1 tablet (20 mg total) by mouth 2 (two) times daily. 08/17/20   McLean-Scocuzza, Nino Glow, MD  fexofenadine (ALLEGRA) 180 MG tablet Take 2 tablets by mouth every morning. 08/07/20   [provider]  FLUoxetine (PROZAC) 10 MG capsule Take 10 mg by mouth daily. 07/15/20   [provider]  fluPHENAZine (PROLIXIN) 2.5 MG tablet Take 1 tablet (2.5 mg total) by mouth at bedtime. 08/17/20   McLean-Scocuzza, Nino Glow, MD  fluticasone (FLONASE) 50 MCG/ACT nasal spray Place 2 sprays into both nostrils daily. 08/30/19   McLean-Scocuzza, Nino Glow, MD  gabapentin (NEURONTIN) 300 MG capsule Take 1 capsule (300 mg total) by mouth 3 (three) times  daily. 06/25/20   McLean-Scocuzza, Nino Glow, MD  hydrocortisone 2.5 % lotion Apply topically 2 (two) times daily.    [provider]  hydrOXYzine (ATARAX/VISTARIL) 25 MG tablet Take 1 tablet (25 mg total) by mouth every 8 (eight) hours as needed. Patient not taking: Reported on 10/01/2020 07/24/20   McLean-Scocuzza, Nino Glow, MD  Insulin Pen Needle (PEN NEEDLES) 30G X 8 MM MISC 1 Device by Does not apply route daily. 06/29/20   McLean-Scocuzza, Nino Glow, MD  liraglutide (VICTOZA) 18 MG/3ML SOPN Inject 0.3 mLs (1.8 mg total) into the skin daily. 04/20/20   McLean-Scocuzza, Nino Glow, MD  Loperamide HCl (IMODIUM A-D PO) Take by mouth.    [provider]  metoprolol succinate (TOPROL-XL) 25 MG 24 hr tablet Take 1 tablet (25 mg total) by mouth daily. Take with or immediately following a meal. 04/20/20   McLean-Scocuzza, Nino Glow, MD  montelukast (SINGULAIR) 10 MG tablet Take 10 mg by mouth daily. 09/18/20   [provider]  Multiple Vitamins-Minerals (ALIVE MULTI-VITAMIN PO) Take by mouth daily in the afternoon.    [provider]  mupirocin ointment (BACTROBAN) 2 % Apply 1 application topically 3 (three) times daily. Scalp 08/17/20   McLean-Scocuzza, Nino Glow, MD  omega-3 acid ethyl esters (LOVAZA) 1 g capsule Take 2 capsules by mouth 2 (two) times daily. 09/03/20   [provider]  oxyCODONE-acetaminophen (PERCOCET) 10-325 MG tablet Take 1 tablet by mouth 3 (three) times daily as needed. 05/31/20   [provider]  Probiotic Product (PROBIOTIC DAILY PO) Take by mouth. probiogen probiotic daily    [provider]  topiramate (TOPAMAX) 100 MG tablet Take 100 mg by mouth daily. 03/12/20   [provider]  triamcinolone cream (KENALOG) 0.1 % Apply 1 application topically 2 (two) times daily.    [provider]  triamcinolone cream (KENALOG) 0.5 % Apply 1 application topically 3 (three) times daily.    [provider]    Family  History  Problem Relation Age of Onset  . Cancer Father        esophageal   . Hypertension Father   . Heart failure Mother   . Hypertension Mother   . Early death Mother   . Heart disease Mother        chf  . Breast cancer Sister 96  . Cancer Sister        breast  . Depression Sister   . Mental illness Sister   . Asthma Son   . Cancer Other        brain cancer     Social History   Tobacco Use  . Smoking status: Never Smoker  . Smokeless tobacco: Current User    Types: Snuff  . Tobacco  comment: Is in process of decreasing amount  Vaping Use  . Vaping Use: Never used  Substance Use Topics  . Alcohol use: No    Alcohol/week: 0.0 standard drinks  . Drug use: No    Allergies as of 10/10/2020 - Review Complete 10/01/2020  Allergen Reaction Noted  . Cephalexin Swelling and Rash 11/01/2015  . Hctz [hydrochlorothiazide] Rash and Swelling 11/01/2015  . Lisinopril Hives and Other (See Comments) 11/01/2015  . Losartan Hives and Swelling 07/08/2017  . Penicillins Rash 11/01/2015  . Bee venom  12/27/2018  . Metformin and related  02/14/2019    Review of Systems:    All systems reviewed and negative except where noted in HPI.   Physical Exam:  There were no vitals taken for this visit. No LMP recorded. Patient has had a hysterectomy. Psych:  Alert and cooperative. Normal mood and affect. General:   Alert,  Well-developed, well-nourished, pleasant and cooperative in NAD Head:  Normocephalic and atraumatic. Eyes:  Sclera clear, no icterus.   Conjunctiva pink. Ears:  Normal auditory acuity. Lungs:  Respirations even and unlabored.  Clear throughout to auscultation.   No wheezes, crackles, or rhonchi. No acute distress. Heart:  Regular rate and rhythm; no murmurs, clicks, rubs, or gallops. Abdomen:  Normal bowel sounds.  No bruits.  Soft, non-tender and non-distended without masses, hepatosplenomegaly or hernias noted.  No guarding or rebound tenderness.    Msk:  Symmetrical  without gross deformities. Good, equal movement & strength bilaterally. Pulses:  Normal pulses noted. Extremities:  No clubbing or edema.  No cyanosis. Neurologic:  Alert and oriented x3;  grossly normal neurologically. Psych:  Alert and cooperative. Normal mood and affect.  Imaging Studies: No results found.  Assessment and Plan:   ASIAH BROWDER is a 55 y.o. y/o female has been referred for dysphagia.  History of acid reflux.  Uses snuff daily which she places under her lower lip.  I have explained to her that it can cause cancer of the lip, tongue, esophagus in addition to other harmful effects of nicotine and the rest of her body.  Plan 1.  EGD in the next 1 to 2 weeks with possible dilation 2.  Commence on Pepcid 40 mg a day. 3.  Counseled on lifestyle changes for GERD.  Weight loss, using a wedge pillow-picture of a wedge pillow provided.  Avoid eating for 2 hours before bedtime. 4.  I discussed the importance of stopping usage of snuff.  I also explained that she should discuss with McLean-Scocuzza, Pasty Spillers, MD about a possible nicotine patch/use of Chantix.  With a history of esophageal cancer in her father it would be very important for her to stop nicotine usage.  She is willing to try options.  I have discussed alternative options, risks & benefits,  which include, but are not limited to, bleeding, infection, perforation,respiratory complication & drug reaction.  The patient agrees with this plan & written consent will be obtained.     Follow up in 8 weeks   Dr [name] MD,MRCP(U.K)

## 2020-10-11 ENCOUNTER — Ambulatory Visit (INDEPENDENT_AMBULATORY_CARE_PROVIDER_SITE_OTHER): Payer: Medicare Other | Admitting: Internal Medicine

## 2020-10-11 ENCOUNTER — Encounter: Payer: Self-pay | Admitting: Internal Medicine

## 2020-10-11 ENCOUNTER — Other Ambulatory Visit: Payer: Self-pay

## 2020-10-11 VITALS — BP 118/64 | HR 68 | Temp 98.6°F | Ht 63.0 in | Wt 236.6 lb

## 2020-10-11 DIAGNOSIS — Z1389 Encounter for screening for other disorder: Secondary | ICD-10-CM

## 2020-10-11 DIAGNOSIS — Z1329 Encounter for screening for other suspected endocrine disorder: Secondary | ICD-10-CM

## 2020-10-11 DIAGNOSIS — I10 Essential (primary) hypertension: Secondary | ICD-10-CM | POA: Diagnosis not present

## 2020-10-11 DIAGNOSIS — G4700 Insomnia, unspecified: Secondary | ICD-10-CM | POA: Insufficient documentation

## 2020-10-11 DIAGNOSIS — L509 Urticaria, unspecified: Secondary | ICD-10-CM

## 2020-10-11 DIAGNOSIS — L7211 Pilar cyst: Secondary | ICD-10-CM | POA: Insufficient documentation

## 2020-10-11 MED ORDER — CETIRIZINE HCL 10 MG PO TABS: 20.0000 mg | ORAL_TABLET | Freq: Every day | ORAL | 0 refills | Status: DC

## 2020-10-11 NOTE — Progress Notes (Signed)
No chief complaint on file.  F/u  1. Pilar cyst crown of scalp ttp to and per pt causing h/a  2. Insomnia improved with zzzquil with melatonin/ashwahghanda taking 3 pills at night  And helping  3. Will ask allergist about flu, Tdap, pfizer booster and shingrix vaccines with h/o idiopathic urticaria   Review of Systems  Constitutional: Negative for weight loss.  HENT: Negative for hearing loss.   Respiratory: Negative for shortness of breath.   Cardiovascular: Negative for chest pain.  Musculoskeletal: Negative for falls and joint pain.  Skin: Negative for rash.  Neurological: Positive for headaches.  Psychiatric/Behavioral: The patient is nervous/anxious. The patient does not have insomnia.        +stress    Past Medical History:  Diagnosis Date  . Allergic rhinitis   . Bipolar disorder ([place])   . Chronic low back pain   . CTS (carpal tunnel syndrome)    hands  . Depression   . Gallstones   . GERD (gastroesophageal reflux disease)   . History of prediabetes   . Hypertension   . Laceration of right foot   . Onychomycosis   . OSA on CPAP   . Overactive bladder   . PVC's (premature ventricular contractions)   . PVC's (premature ventricular contractions)    Dr. Raliegh Ip cards  . Seizures ([place])    8th grade   Past Surgical History:  Procedure Laterality Date  . ABDOMINAL HYSTERECTOMY  07/08/2011  . ABDOMINAL HYSTERECTOMY     supracervical in 07/2011   . CARPAL TUNNEL RELEASE     right hand 1996  . CESAREAN SECTION     2012  . CHOLECYSTECTOMY N/A 01/12/2018   Procedure: LAPAROSCOPIC CHOLECYSTECTOMY WITH INTRAOPERATIVE CHOLANGIOGRAM ERAS PATHWAY;  Surgeon: Johnathan Hausen, MD;  Location: [facility];  Service: General;  Laterality: N/A;  . COLONOSCOPY WITH PROPOFOL N/A 04/15/2019   Procedure: COLONOSCOPY WITH PROPOFOL;  Surgeon: Jonathon Bellows, MD;  Location: [facility];  Service: Gastroenterology;  Laterality: N/A;  . DILATION AND CURETTAGE OF UTERUS     04/2011 benign endometrial  polyp squamous metaplasia, inflamm, blood, mucous, benign   . FOOT SURGERY     Dr. Prudence Davidson   . LEEP     2006  . TUBAL LIGATION     Family History  Problem Relation Age of Onset  . Cancer Father        esophageal   . Hypertension Father   . Heart failure Mother   . Hypertension Mother   . Early death Mother   . Heart disease Mother        chf  . Breast cancer Sister 46  . Cancer Sister        breast  . Depression Sister   . Mental illness Sister   . Asthma Son   . Cancer Other        brain cancer   Social History   Socioeconomic History  . Marital status: Married    Spouse name: Not on file  . Number of children: Not on file  . Years of education: Not on file  . Highest education level: Not on file  Occupational History  . Not on file  Tobacco Use  . Smoking status: Never Smoker  . Smokeless tobacco: Current User    Types: Snuff  . Tobacco comment: Is in process of decreasing amount  Vaping Use  . Vaping Use: Never used  Substance and Sexual Activity  . Alcohol use: No  Alcohol/week: 0.0 standard drinks  . Drug use: No  . Sexual activity: Yes    Comment: men  Other Topics Concern  . Not on file  Social History Narrative   Married    No guns    Wears seat belt    Safe in relationship    Never smoker    2 sons Gaspar Bidding ~10 and another son ~28 as of 10/2019   1 granson eli 1 y.o as of 10/2019   2 sisters    Unemployed       Social Determinants of Health   Financial Resource Strain: Low Risk   . Difficulty of Paying Living Expenses: Not hard at all  Food Insecurity: No Food Insecurity  . Worried About Charity fundraiser in the Last Year: Never true  . Ran Out of Food in the Last Year: Never true  Transportation Needs: No Transportation Needs  . Lack of Transportation (Medical): No  . Lack of Transportation (Non-Medical): No  Physical Activity: Not on file  Stress: No Stress Concern Present  . Feeling of Stress : Not at all  Social Connections:  Unknown  . Frequency of Communication with Friends and Family: Not on file  . Frequency of Social Gatherings with Friends and Family: Not on file  . Attends Religious Services: Not on file  . Active Member of Clubs or Organizations: Not on file  . Attends Archivist Meetings: Not on file  . Marital Status: Married  Human resources officer Violence: Not At Risk  . Fear of Current or Ex-Partner: No  . Emotionally Abused: No  . Physically Abused: No  . Sexually Abused: No   Current Meds  Medication Sig  . NON FORMULARY Zzzquil melatonin/ashwaghana 3 pills at night   Allergies  Allergen Reactions  . Cephalexin Swelling and Rash  . Hctz [Hydrochlorothiazide] Rash and Swelling    Mild she says  . Lisinopril Hives and Other (See Comments)    Headache, Low BP, angioedema    . Losartan Hives and Swelling    Angioedema  . Penicillins Rash    Has patient had a PCN reaction causing immediate rash, facial/tongue/throat swelling, SOB or lightheadedness with hypotension: Yes Has patient had a PCN reaction causing severe rash involving mucus membranes or skin necrosis: No Has patient had a PCN reaction that required hospitalization: No Has patient had a PCN reaction occurring within the last 10 years: No If all of the above answers are "NO", then may proceed with Cephalosporin use.   . Bee Venom     Swelling   . Metformin And Related     Rash    Recent Results (from the past 2160 hour(s))  Group A Strep by PCR     Status: None   Collection Time: 08/15/20  3:37 PM   Specimen: Throat; Sterile Swab  Result Value Ref Range   Group A Strep by PCR NOT DETECTED NOT DETECTED    Comment: Performed at [facility], [address] [place], [place] [address]  SARS CORONAVIRUS 2 (TAT 6-24 HRS) Nasopharyngeal Nasopharyngeal Swab     Status: None   Collection Time: 08/15/20  3:37 PM   Specimen: Nasopharyngeal Swab  Result Value Ref Range   SARS Coronavirus 2 NEGATIVE NEGATIVE     Comment: (NOTE) SARS-CoV-2 target nucleic acids are NOT DETECTED.  The SARS-CoV-2 RNA is generally detectable in upper and lower respiratory specimens during the acute phase of infection. Negative results do not preclude SARS-CoV-2  infection, do not rule out co-infections with other pathogens, and should not be used as the sole basis for treatment or other patient management decisions. Negative results must be combined with clinical observations, patient history, and epidemiological information. The expected result is Negative.  Fact Sheet for Patients: SugarRoll.be  Fact Sheet for Healthcare Providers: https://www.woods-mathews.com/  This test is not yet approved or cleared by the [place] FDA and  has been authorized for detection and/or diagnosis of SARS-CoV-2 by FDA under an Emergency Use Authorization (EUA). This EUA will remain  in effect (meaning this test can be used) for the duration of the COVID-19 declaration under Se ction 564(b)(1) of the Act, 21 U.S.C. section 360bbb-3(b)(1), unless the authorization is terminated or revoked sooner.  Performed at [facility], [place] [address] [place], [place] [address]   Comprehensive metabolic panel     Status: Abnormal   Collection Time: 08/17/20  1:04 PM  Result Value Ref Range   Sodium 141 135 - 145 mEq/L   Potassium 3.6 3.5 - 5.1 mEq/L   Chloride 107 96 - 112 mEq/L   CO2 27 19 - 32 mEq/L   Glucose, Bld 109 (H) 70 - 99 mg/dL   BUN 10 6 - 23 mg/dL   Creatinine, Ser 0.96 0.40 - 1.20 mg/dL   Total Bilirubin 0.4 0.2 - 1.2 mg/dL   Alkaline Phosphatase 50 39 - 117 U/L   AST 12 0 - 37 U/L   ALT 19 0 - 35 U/L   Total Protein 6.1 6.0 - 8.3 g/dL   Albumin 3.8 3.5 - 5.2 g/dL   GFR 68.18 >60.00 mL/min    Comment: Calculated using the CKD-EPI Creatinine Equation (2021)   Calcium 9.0 8.4 - 10.5 mg/dL  Glucose 6 phosphate dehydrogenase     Status: None   Collection Time: 08/17/20   1:04 PM  Result Value Ref Range   G-6PDH 15.7 7.0 - 20.5 U/g Hgb  Hepatitis C antibody     Status: None   Collection Time: 08/17/20  2:24 PM  Result Value Ref Range   Hepatitis C Ab NON-REACTIVE NON-REACTI   SIGNAL TO CUT-OFF 0.01 <1.00    Comment: . HCV antibody was non-reactive. There is no laboratory  evidence of HCV infection. . In most cases, no further action is required. However, if recent HCV exposure is suspected, a test for HCV RNA (test code [phone]) is suggested. . For additional information please refer to http://education.questdiagnostics.com/faq/FAQ22v1 (This link is being provided for informational/ educational purposes only.) .    Objective  Body mass index is 41.91 kg/m. Wt Readings from Last 3 Encounters:  10/11/20 236 lb 9.6 oz (107.3 kg)  10/10/20 238 lb (108 kg)  10/01/20 232 lb (105.2 kg)   Temp Readings from Last 3 Encounters:  10/11/20 98.6 F (37 C)  10/10/20 98.5 F (36.9 C) (Oral)  08/17/20 98.4 F (36.9 C) (Oral)   BP Readings from Last 3 Encounters:  10/11/20 118/64  10/10/20 114/77  08/17/20 120/78   Pulse Readings from Last 3 Encounters:  10/11/20 68  10/10/20 80  08/17/20 78    Physical Exam Vitals and nursing note reviewed.  Constitutional:      Appearance: Normal appearance. She is well-developed and well-groomed. She is morbidly obese.  HENT:     Head: Normocephalic and atraumatic.  Eyes:     Conjunctiva/sclera: Conjunctivae normal.     Pupils: Pupils are equal, round, and reactive to light.  Cardiovascular:  Rate and Rhythm: Normal rate and regular rhythm.     Heart sounds: Normal heart sounds. No murmur heard.   Pulmonary:     Effort: Pulmonary effort is normal.     Breath sounds: Normal breath sounds.  Skin:    General: Skin is warm and dry.  Neurological:     General: No focal deficit present.     Mental Status: She is alert and oriented to person, place, and time. Mental status is at baseline.     Gait:  Gait normal.  Psychiatric:        Attention and Perception: Attention and perception normal.        Mood and Affect: Mood and affect normal.        Speech: Speech normal.        Behavior: Behavior normal. Behavior is cooperative.        Thought Content: Thought content normal.        Cognition and Memory: Cognition and memory normal.        Judgment: Judgment normal.     Assessment  Plan  Urticaria, unspecified - Plan: cetirizine (ZYRTEC ALLERGY) 20 MG tablet qd and allegra 2 pills qd   Insomnia, unspecified type zzzquil 3 pills qhs prn   Hypertension, unspecified type - Plan: Comprehensive metabolic panel, Lipid panel, CBC with Differential/Platelet Cont meds controlled today norvasc 5 mg qd  Lopressor 25 mg qd   Pilar cyst   F/u [facility] derm for removal   Urticaria per [facility] 09/18/20 idopathic no clear trigger consider HCQ but reticent to consider omalizumab due to unclear etiology and cost [facility] allergy  Zyrtec 3 pills at night allegra 3 pills in the am then reduced to 2 and 2 and f/u cont pepcid 20 mg bid and hydroxyzine 25 prn started singulair 10 mg qhs  F/u in 3 months [facility] allergy 01/2021  F/u [facility] derm 11/20/20 Dr. Charlyne Quale rec colloidal oatmeal aveeno baths  Check [facility] allergy if vaccines ok ? Flu, tdap, pfizer booster, shingrix   HM Declines flu shotallergic Tdap utd due 7/13/2020will do in future at f/u [place] 2/2 consider booster ask allergy about this  Allergic to pna vaccine Consider shingrix future Consider shingrix but given allergies use caution Not immuneMMR, hep B titer in futureuse caution though with h/o allergies with all vaccines 915/21 house calls visit A1c 6.1  h/o supracervical hysterectomy ([facility]), pap 02/29/16 negative no comment HPV, another pap 06/23/17 neg pap neg HPV  -h/o LEEP 2006  mammo 08/04/19 negativeordered11/17/21 negative   Colonoscopy7/10/20 normal f/u in 10 years egd sch 10/23/20 Dr. Vicente Males   Echo Dr.Kowalski  -nl LV  function EF 60% moderate mitral insuff and mild tricuspid insufficiency treadmill stress test neg 07/30/13 -h/o PVCS  Hep C neg 08/17/20 HIV neg 02/04/16  Ob/gyn appt pending bladder leakage [facility](stopped PT w/o help worsen sneezing or coughing) -Dr. Georgianne Fick pt wants surgeryas of 04/20/20 sx's better and no surgery indicated  Consider referral repeat sleep study h/o OSA  Provider: Dr. Olivia Mackie McLean-Scocuzza-Internal Medicine

## 2020-10-11 NOTE — Patient Instructions (Addendum)
[facility]  [address]  [place], [place] [address]  Phone: [phone]  Fax: [phone]     Dr. Thelma Barge Fax # [phone]   Stop lovaza 2 pills in am and pm for now    Pilar cyst Web MD or [facility]   11/20/2020 Office Visit Dermatology Abbie Sons, MD  [address]  [address]  [place], [place] [address]  [phone] (Work)  [phone] (Fax)    01/18/2021 Office Visit Allergy Manfred Shirts, MD  [address]  [address]  [place], [place] [address]  [phone] (Work)  [phone] (Fax)

## 2020-10-12 ENCOUNTER — Telehealth: Payer: Self-pay

## 2020-10-12 DIAGNOSIS — J309 Allergic rhinitis, unspecified: Secondary | ICD-10-CM

## 2020-10-12 DIAGNOSIS — G4733 Obstructive sleep apnea (adult) (pediatric): Secondary | ICD-10-CM

## 2020-10-12 DIAGNOSIS — Z87898 Personal history of other specified conditions: Secondary | ICD-10-CM

## 2020-10-12 DIAGNOSIS — F39 Unspecified mood [affective] disorder: Secondary | ICD-10-CM

## 2020-10-12 DIAGNOSIS — I10 Essential (primary) hypertension: Secondary | ICD-10-CM

## 2020-10-12 DIAGNOSIS — Z6841 Body Mass Index (BMI) 40.0 and over, adult: Secondary | ICD-10-CM

## 2020-10-12 MED ORDER — LIRAGLUTIDE 18 MG/3ML ~~LOC~~ SOPN: 1.8000 mg | PEN_INJECTOR | Freq: Every day | SUBCUTANEOUS | 1 refills | Status: DC

## 2020-10-12 MED ORDER — MONTELUKAST SODIUM 10 MG PO TABS: 10.0000 mg | ORAL_TABLET | Freq: Every day | ORAL | 1 refills | Status: DC

## 2020-10-12 MED ORDER — FLUTICASONE PROPIONATE 50 MCG/ACT NA SUSP: 2.0000 | Freq: Every day | NASAL | 3 refills | Status: DC

## 2020-10-12 NOTE — Telephone Encounter (Signed)
Pt needs refill on flonase, montelukast, and victoza sent to [facility].

## 2020-10-15 ENCOUNTER — Telehealth: Payer: Self-pay | Admitting: Gastroenterology

## 2020-10-15 NOTE — Telephone Encounter (Signed)
Patient has questions about procedure

## 2020-10-16 NOTE — Telephone Encounter (Signed)
Returned patients call. She asked if Dr. Vicente Males performs weight loss surgery. Informed patient he does not. Pt had questions regarding covid test date. Told pt she will go on the 14th. Pt verbalized understanding.

## 2020-10-18 ENCOUNTER — Telehealth: Payer: Self-pay

## 2020-10-18 NOTE — Telephone Encounter (Signed)
Faxed prescription request back to [facility] dermatology for Betamethasone and desoximetasone [phone]

## 2020-10-19 ENCOUNTER — Other Ambulatory Visit
Admission: RE | Admit: 2020-10-19 | Discharge: 2020-10-19 | Disposition: A | Discharge status: Home / Self Care | Payer: Medicare Other | Source: Ambulatory Visit | Attending: Gastroenterology | Admitting: Gastroenterology

## 2020-10-19 ENCOUNTER — Other Ambulatory Visit: Payer: Self-pay

## 2020-10-19 ENCOUNTER — Ambulatory Visit: Admit: 2020-10-19 | Discharge: 2020-10-20 | Payer: MEDICARE

## 2020-10-19 DIAGNOSIS — T783XXD Angioneurotic edema, subsequent encounter: Principal | ICD-10-CM

## 2020-10-19 DIAGNOSIS — L508 Other urticaria: Principal | ICD-10-CM

## 2020-10-19 DIAGNOSIS — S8011XA Contusion of right lower leg, initial encounter: Secondary | ICD-10-CM | POA: Diagnosis not present

## 2020-10-19 DIAGNOSIS — Z01812 Encounter for preprocedural laboratory examination: Secondary | ICD-10-CM | POA: Insufficient documentation

## 2020-10-19 DIAGNOSIS — Z20822 Contact with and (suspected) exposure to covid-19: Secondary | ICD-10-CM | POA: Diagnosis not present

## 2020-10-19 DIAGNOSIS — M25561 Pain in right knee: Secondary | ICD-10-CM | POA: Diagnosis not present

## 2020-10-19 DIAGNOSIS — M1711 Unilateral primary osteoarthritis, right knee: Secondary | ICD-10-CM | POA: Diagnosis not present

## 2020-10-19 DIAGNOSIS — S8001XA Contusion of right knee, initial encounter: Secondary | ICD-10-CM | POA: Diagnosis not present

## 2020-10-19 LAB — SARS CORONAVIRUS 2 (TAT 6-24 HRS): SARS Coronavirus 2: NEGATIVE

## 2020-10-22 ENCOUNTER — Encounter: Payer: Self-pay | Admitting: Gastroenterology

## 2020-10-23 ENCOUNTER — Encounter: Payer: Self-pay | Admitting: Gastroenterology

## 2020-10-23 ENCOUNTER — Ambulatory Visit: Payer: Medicare Other | Admitting: Certified Registered Nurse Anesthetist

## 2020-10-23 ENCOUNTER — Other Ambulatory Visit: Payer: Self-pay

## 2020-10-23 ENCOUNTER — Ambulatory Visit
Admission: RE | Admit: 2020-10-23 | Discharge: 2020-10-23 | Disposition: A | Discharge status: Home / Self Care | Payer: Medicare Other | Attending: Gastroenterology | Admitting: Gastroenterology

## 2020-10-23 ENCOUNTER — Encounter
Admission: RE | Disposition: A | Discharge status: Home / Self Care | Payer: Self-pay | Source: Home / Self Care | Attending: Gastroenterology

## 2020-10-23 DIAGNOSIS — T18128A Food in esophagus causing other injury, initial encounter: Secondary | ICD-10-CM | POA: Diagnosis not present

## 2020-10-23 DIAGNOSIS — K2100 Gastro-esophageal reflux disease with esophagitis, without bleeding: Secondary | ICD-10-CM

## 2020-10-23 DIAGNOSIS — K2289 Other specified disease of esophagus: Secondary | ICD-10-CM | POA: Insufficient documentation

## 2020-10-23 DIAGNOSIS — E559 Vitamin D deficiency, unspecified: Secondary | ICD-10-CM | POA: Diagnosis not present

## 2020-10-23 DIAGNOSIS — G4733 Obstructive sleep apnea (adult) (pediatric): Secondary | ICD-10-CM | POA: Diagnosis not present

## 2020-10-23 DIAGNOSIS — Z7984 Long term (current) use of oral hypoglycemic drugs: Secondary | ICD-10-CM | POA: Insufficient documentation

## 2020-10-23 DIAGNOSIS — E785 Hyperlipidemia, unspecified: Secondary | ICD-10-CM | POA: Diagnosis not present

## 2020-10-23 DIAGNOSIS — Z79899 Other long term (current) drug therapy: Secondary | ICD-10-CM | POA: Insufficient documentation

## 2020-10-23 DIAGNOSIS — K219 Gastro-esophageal reflux disease without esophagitis: Secondary | ICD-10-CM | POA: Diagnosis not present

## 2020-10-23 DIAGNOSIS — X58XXXA Exposure to other specified factors, initial encounter: Secondary | ICD-10-CM | POA: Insufficient documentation

## 2020-10-23 DIAGNOSIS — R1310 Dysphagia, unspecified: Secondary | ICD-10-CM | POA: Diagnosis not present

## 2020-10-23 HISTORY — PX: ESOPHAGOGASTRODUODENOSCOPY (EGD) WITH PROPOFOL: SHX5813

## 2020-10-23 LAB — GLUCOSE, CAPILLARY: Glucose-Capillary: 86 mg/dL (ref 70–99)

## 2020-10-23 SURGERY — ESOPHAGOGASTRODUODENOSCOPY (EGD) WITH PROPOFOL
Anesthesia: General

## 2020-10-23 MED ORDER — PROPOFOL 10 MG/ML IV BOLUS
INTRAVENOUS | Status: DC | PRN
Start: 2020-10-23 — End: 2020-10-23
  Administered 2020-10-23: 60 mg via INTRAVENOUS

## 2020-10-23 MED ORDER — PROPOFOL 500 MG/50ML IV EMUL
INTRAVENOUS | Status: AC
  Filled 2020-10-23: qty 50

## 2020-10-23 MED ORDER — LIDOCAINE HCL (CARDIAC) PF 100 MG/5ML IV SOSY
PREFILLED_SYRINGE | INTRAVENOUS | Status: DC | PRN
  Administered 2020-10-23: 50 mg via INTRAVENOUS

## 2020-10-23 MED ORDER — PROPOFOL 500 MG/50ML IV EMUL
INTRAVENOUS | Status: DC | PRN
  Administered 2020-10-23: 150 ug/kg/min via INTRAVENOUS

## 2020-10-23 MED ORDER — SODIUM CHLORIDE 0.9 % IV SOLN: INTRAVENOUS | Status: DC

## 2020-10-23 MED ORDER — LIDOCAINE HCL (PF) 2 % IJ SOLN
INTRAMUSCULAR | Status: AC
  Filled 2020-10-23: qty 5

## 2020-10-23 NOTE — Anesthesia Postprocedure Evaluation (Signed)
Anesthesia Post Note  Patient: Heather Sweeney  Procedure(s) Performed: ESOPHAGOGASTRODUODENOSCOPY (EGD) WITH PROPOFOL (N/A )  Patient location during evaluation: Endoscopy Anesthesia Type: General Level of consciousness: awake and alert Pain management: pain level controlled Vital Signs Assessment: post-procedure vital signs reviewed and stable Respiratory status: spontaneous breathing, nonlabored ventilation, respiratory function stable and patient connected to nasal cannula oxygen Cardiovascular status: blood pressure returned to baseline and stable Postop Assessment: no apparent nausea or vomiting Anesthetic complications: no   No complications documented.   Last Vitals:  Vitals:   10/23/20 1042 10/23/20 1052  BP: 121/77 126/81  Pulse:    Resp:    Temp:    SpO2:      Last Pain:  Vitals:   10/23/20 1102  TempSrc:   PainSc: 0-No pain                 Martha Clan

## 2020-10-23 NOTE — Transfer of Care (Signed)
Immediate Anesthesia Transfer of Care Note  Patient: Heather Sweeney  Procedure(s) Performed: ESOPHAGOGASTRODUODENOSCOPY (EGD) WITH PROPOFOL (N/A )  Patient Location: PACU  Anesthesia Type:General  Level of Consciousness: drowsy  Airway & Oxygen Therapy: Patient Spontanous Breathing  Post-op Assessment: Report given to RN and Post -op Vital signs reviewed and stable  Post vital signs: Reviewed and stable  Last Vitals:  Vitals Value Taken Time  BP 107/69 10/23/20 1033  Temp    Pulse 73 10/23/20 1033  Resp 22 10/23/20 1033  SpO2 91 % 10/23/20 1033  Vitals shown include unvalidated device data.  Last Pain:  Vitals:   10/23/20 0909  TempSrc: Temporal  PainSc: 0-No pain         Complications: No complications documented.

## 2020-10-23 NOTE — Op Note (Signed)
[facility] Patient Name: Heather Sweeney Procedure Date: 10/23/2020 10:20 AM MRN: [id number] Account #: [id number] Date of Birth: 1966-05-16 Admit Type: Outpatient Age: 55 Room: [facility] ENDO ROOM 2 Gender: Female Note Status: Finalized Procedure:             Upper GI endoscopy Indications:           Dysphagia Providers:             Jonathon Bellows MD, MD Referring MD:          [name] MD, MD (Referring MD) Medicines:             Monitored Anesthesia Care Complications:         No immediate complications. Procedure:             Pre-Anesthesia Assessment:                        - Prior to the procedure, a History and Physical was                         performed, and patient medications, allergies and                         sensitivities were reviewed. The patient's tolerance                         of previous anesthesia was reviewed.                        - The risks and benefits of the procedure and the                         sedation options and risks were discussed with the                         patient. All questions were answered and informed                         consent was obtained.                        - ASA Grade Assessment: II - A patient with mild                         systemic disease.                        After obtaining informed consent, the endoscope was                         passed under direct vision. Throughout the procedure,                         the patient's blood pressure, pulse, and oxygen                         saturations were monitored continuously. The Endoscope                         was introduced through the mouth, and  advanced to the                         third part of duodenum. The upper GI endoscopy was                         accomplished with ease. The patient tolerated the                         procedure well. Findings:      The examined duodenum was normal.      The stomach  was normal.      The cardia and gastric fundus were normal on retroflexion.      Food was found in the mid esophagus. small fragments of food ,      Abnormal motility was noted in the esophagus. The cricopharyngeus was       abnormal. There is a decrease in motility of the esophageal body. The       distal esophagus/lower esophageal sphincter is open. Normal peristalsis       not noted. Biopsies were taken with a cold forceps for histology.       Biopsies were taken with a cold forceps for histology. Impression:            - Normal examined duodenum.                        - Normal stomach.                        - Food in the mid esophagus.                        - Abnormal esophageal motility, suspicious for                         aperistalsis. Biopsied. Recommendation:        - Discharge patient to home (with escort).                        - Resume previous diet.                        - Continue present medications.                        - Return to my office as previously scheduled.                        - Consider gastric emptying study , esophageal                         manometry and review medications at follow up Procedure Code(s):     --- Professional ---                        [phone], Esophagogastroduodenoscopy, flexible,                         transoral; with biopsy, single or multiple Diagnosis Code(s):     --- Professional ---  G29.528U, Food in esophagus causing other injury,                         initial encounter                        K22.4, Dyskinesia of esophagus                        R13.10, Dysphagia, unspecified CPT copyright 2019 American Medical Association. All rights reserved. The codes documented in this report are preliminary and upon coder review may  be revised to meet current compliance requirements. Jonathon Bellows, MD Jonathon Bellows MD, MD 10/23/2020 10:31:20 AM This report has been signed electronically. Number of Addenda:  0 Note Initiated On: 10/23/2020 10:20 AM Estimated Blood Loss:  Estimated blood loss: none.      [facility]

## 2020-10-23 NOTE — H&P (Signed)
Jonathon Bellows, MD [address] [place], [place], [place], [address] [id number], [place], [place], [place], [address] Phone: [phone]  Fax: [phone]  Primary Care Physician:  McLean-Scocuzza, Nino Glow, MD   Pre-Procedure History & Physical: HPI:  Heather Sweeney is a 55 y.o. female is here for an endoscopy    Past Medical History:  Diagnosis Date  . Allergic rhinitis   . Bipolar disorder ([facility])   . Chronic low back pain   . CTS (carpal tunnel syndrome)    hands  . Depression   . Gallstones   . GERD (gastroesophageal reflux disease)   . History of prediabetes   . Hypertension   . Laceration of right foot   . Onychomycosis   . OSA on CPAP   . Overactive bladder   . PVC's (premature ventricular contractions)   . PVC's (premature ventricular contractions)    Dr. Raliegh Ip cards  . Seizures ([place])    8th grade    Past Surgical History:  Procedure Laterality Date  . ABDOMINAL HYSTERECTOMY  07/08/2011  . ABDOMINAL HYSTERECTOMY     supracervical in 07/2011   . CARPAL TUNNEL RELEASE     right hand 1996  . CESAREAN SECTION     2012  . CHOLECYSTECTOMY N/A 01/12/2018   Procedure: LAPAROSCOPIC CHOLECYSTECTOMY WITH INTRAOPERATIVE CHOLANGIOGRAM ERAS PATHWAY;  Surgeon: Johnathan Hausen, MD;  Location: [facility] ORS;  Service: General;  Laterality: N/A;  . COLONOSCOPY WITH PROPOFOL N/A 04/15/2019   Procedure: COLONOSCOPY WITH PROPOFOL;  Surgeon: Jonathon Bellows, MD;  Location: [facility] ENDOSCOPY;  Service: Gastroenterology;  Laterality: N/A;  . DILATION AND CURETTAGE OF UTERUS     04/2011 benign endometrial polyp squamous metaplasia, inflamm, blood, mucous, benign   . FOOT SURGERY     Dr. Prudence Davidson   . LEEP     2006  . TUBAL LIGATION      Prior to Admission medications   Medication Sig Start Date End Date Taking? Authorizing Provider  amLODipine (NORVASC) 5 MG tablet Take 1 tablet (5 mg total) by mouth daily. 04/20/20 04/20/21 Yes McLean-Scocuzza, Nino Glow, MD  benztropine (COGENTIN) 1 MG  tablet Take 1 mg by mouth daily. 08/10/20  Yes [provider]  cetirizine (ZYRTEC ALLERGY) 10 MG tablet Take 2 tablets (20 mg total) by mouth daily. 10/11/20  Yes McLean-Scocuzza, Nino Glow, MD  famotidine (PEPCID) 40 MG tablet Take 1 tablet (40 mg total) by mouth daily. 10/10/20  Yes Jonathon Bellows, MD  fexofenadine (ALLEGRA) 180 MG tablet Take 2 tablets by mouth every morning. 08/07/20  Yes [provider]  FLUoxetine (PROZAC) 10 MG capsule Take 10 mg by mouth daily. 07/15/20  Yes [provider]  fluPHENAZine (PROLIXIN) 2.5 MG tablet Take 1 tablet (2.5 mg total) by mouth at bedtime. 08/17/20  Yes McLean-Scocuzza, Nino Glow, MD  hydrOXYzine (ATARAX/VISTARIL) 25 MG tablet Take 1 tablet (25 mg total) by mouth every 8 (eight) hours as needed. 07/24/20  Yes McLean-Scocuzza, Nino Glow, MD  liraglutide (VICTOZA) 18 MG/3ML SOPN Inject 1.8 mg into the skin daily. 10/12/20  Yes McLean-Scocuzza, Nino Glow, MD  metoprolol succinate (TOPROL-XL) 25 MG 24 hr tablet Take 1 tablet (25 mg total) by mouth daily. Take with or immediately following a meal. 04/20/20  Yes McLean-Scocuzza, Nino Glow, MD  montelukast (SINGULAIR) 10 MG tablet Take 1 tablet (10 mg total) by mouth daily. 10/12/20  Yes McLean-Scocuzza, Nino Glow, MD  Multiple Vitamins-Minerals (ALIVE MULTI-VITAMIN PO) Take by mouth daily in the  afternoon.   Yes [provider]  oxyCODONE-acetaminophen (PERCOCET) 10-325 MG tablet Take 1 tablet by mouth 3 (three) times daily as needed. 05/31/20  Yes [provider]  Probiotic Product (PROBIOTIC DAILY PO) Take by mouth. probiogen probiotic daily   Yes [provider]  topiramate (TOPAMAX) 100 MG tablet Take 100 mg by mouth daily. 03/12/20  Yes [provider]  Blood Glucose Calibration (TRUE METRIX LEVEL 1) Low SOLN  01/12/20   [provider]  clobetasol cream (TEMOVATE) 3.53 % Apply 1 application topically 2 (two) times daily.    [provider]  EPINEPHrine  0.3 mg/0.3 mL IJ SOAJ injection Inject 0.3 mg into the muscle as needed for anaphylaxis. 08/17/20   McLean-Scocuzza, Nino Glow, MD  fexofenadine-pseudoephedrine (ALLEGRA-D) 60-120 MG 12 hr tablet Take 1 tablet by mouth 2 (two) times daily.    [provider]  fluticasone (FLONASE) 50 MCG/ACT nasal spray Place 2 sprays into both nostrils daily. 10/12/20   McLean-Scocuzza, Nino Glow, MD  hydrocortisone 2.5 % lotion Apply topically 2 (two) times daily.    [provider]  Insulin Pen Needle (PEN NEEDLES) 30G X 8 MM MISC 1 Device by Does not apply route daily. 06/29/20   McLean-Scocuzza, Nino Glow, MD  Loperamide HCl (IMODIUM A-D PO) Take by mouth.    [provider]  mupirocin ointment (BACTROBAN) 2 % Apply 1 application topically 3 (three) times daily. Scalp 08/17/20   McLean-Scocuzza, Nino Glow, MD  NON FORMULARY Zzzquil melatonin/ashwaghana 3 pills at night    [provider]  triamcinolone cream (KENALOG) 0.1 % Apply 1 application topically 2 (two) times daily.    [provider]  triamcinolone cream (KENALOG) 0.5 % Apply 1 application topically 3 (three) times daily.    [provider]    Allergies as of 10/10/2020 - Review Complete 10/10/2020  Allergen Reaction Noted  . Cephalexin Swelling and Rash 11/01/2015  . Hctz [hydrochlorothiazide] Rash and Swelling 11/01/2015  . Lisinopril Hives and Other (See Comments) 11/01/2015  . Losartan Hives and Swelling 07/08/2017  . Penicillins Rash 11/01/2015  . Bee venom  12/27/2018  . Metformin and related  02/14/2019    Family History  Problem Relation Age of Onset  . Cancer Father        esophageal   . Hypertension Father   . Heart failure Mother   . Hypertension Mother   . Early death Mother   . Heart disease Mother        chf  . Breast cancer Sister 31  . Cancer Sister        breast  . Depression Sister   . Mental illness Sister   . Asthma Son   . Cancer Other        brain cancer     Social History   Socioeconomic History  . Marital status: Married    Spouse name: Not on file  . Number of children: Not on file  . Years of education: Not on file  . Highest education level: Not on file  Occupational History  . Not on file  Tobacco Use  . Smoking status: Never Smoker  . Smokeless tobacco: Current User    Types: Snuff  . Tobacco comment: Is in process of decreasing amount  Vaping Use  . Vaping Use: Never used  Substance and Sexual Activity  . Alcohol use: No    Alcohol/week: 0.0 standard drinks  . Drug use: No  . Sexual activity: Yes  Comment: men  Other Topics Concern  . Not on file  Social History Narrative   Married    No guns    Wears seat belt    Safe in relationship    Never smoker    2 sons Gaspar Bidding ~10 and another son ~28 as of 10/2019   1 granson eli 1 y.o as of 10/2019   2 sisters    Unemployed       Social Determinants of Health   Financial Resource Strain: Low Risk   . Difficulty of Paying Living Expenses: Not hard at all  Food Insecurity: No Food Insecurity  . Worried About Charity fundraiser in the Last Year: Never true  . Ran Out of Food in the Last Year: Never true  Transportation Needs: No Transportation Needs  . Lack of Transportation (Medical): No  . Lack of Transportation (Non-Medical): No  Physical Activity: Not on file  Stress: No Stress Concern Present  . Feeling of Stress : Not at all  Social Connections: Unknown  . Frequency of Communication with Friends and Family: Not on file  . Frequency of Social Gatherings with Friends and Family: Not on file  . Attends Religious Services: Not on file  . Active Member of Clubs or Organizations: Not on file  . Attends Archivist Meetings: Not on file  . Marital Status: Married  Human resources officer Violence: Not At Risk  . Fear of Current or Ex-Partner: No  . Emotionally Abused: No  . Physically Abused: No  . Sexually Abused: No    Review of Systems: See HPI,  otherwise negative ROS  Physical Exam: BP 127/81   Pulse 78   Temp (!) 96.9 F (36.1 C) (Temporal)   Resp 18   Ht 5\' 3"  (1.6 m)   Wt 110.2 kg   SpO2 98%   BMI 43.05 kg/m  General:   Alert,  pleasant and cooperative in NAD Head:  Normocephalic and atraumatic. Neck:  Supple; no masses or thyromegaly. Lungs:  Clear throughout to auscultation, normal respiratory effort.    Heart:  +S1, +S2, Regular rate and rhythm, No edema. Abdomen:  Soft, nontender and nondistended. Normal bowel sounds, without guarding, and without rebound.   Neurologic:  Alert and  oriented x4;  grossly normal neurologically.  Impression/Plan: Heather Sweeney is here for an endoscopy  to be performed for  evaluation of dysphagia    Risks, benefits, limitations, and alternatives regarding endoscopy have been reviewed with the patient.  Questions have been answered.  All parties agreeable.   Jonathon Bellows, MD  10/23/2020, 10:21 AM

## 2020-10-23 NOTE — Anesthesia Preprocedure Evaluation (Signed)
Anesthesia Evaluation  Patient identified by MRN, date of birth, ID band Patient awake    Reviewed: Allergy & Precautions, NPO status , Patient's Chart, lab work & pertinent test results, reviewed documented beta blocker date and time   History of Anesthesia Complications Negative for: history of anesthetic complications  Airway Mallampati: II  TM Distance: >3 FB Neck ROM: Full    Dental  (+) Partial Lower, Partial Upper   Pulmonary neg shortness of breath, sleep apnea , neg COPD, neg recent URI,    Pulmonary exam normal breath sounds clear to auscultation       Cardiovascular hypertension, Pt. on medications and Pt. on home beta blockers (-) angina(-) Past MI and (-) Cardiac Stents Normal cardiovascular exam+ dysrhythmias (-) Valvular Problems/Murmurs Rhythm:Regular Rate:Normal     Neuro/Psych Seizures -, Well Controlled,  PSYCHIATRIC DISORDERS Depression Bipolar Disorder  Neuromuscular disease    GI/Hepatic Neg liver ROS, GERD  Medicated and Controlled,  Endo/Other  diabetes, Type 2, Oral Hypoglycemic AgentsMorbid obesity  Renal/GU negative Renal ROS Bladder dysfunction      Musculoskeletal   Abdominal (+) + obese,   Peds negative pediatric ROS (+)  Hematology negative hematology ROS (+)   Anesthesia Other Findings Past Medical History: No date: Allergic rhinitis No date: Bipolar disorder ([facility]) No date: Chronic low back pain No date: CTS (carpal tunnel syndrome)     Comment:  hands No date: Depression No date: Gallstones No date: GERD (gastroesophageal reflux disease) No date: History of prediabetes No date: Hypertension No date: Laceration of right foot No date: Onychomycosis No date: OSA on CPAP No date: Overactive bladder No date: PVC's (premature ventricular contractions) No date: Seizures ([facility])     Comment:  8th grade  Reproductive/Obstetrics                              Anesthesia Physical  Anesthesia Plan  ASA: III  Anesthesia Plan: General   Post-op Pain Management:    Induction: Intravenous  PONV Risk Score and Plan: 4 or greater and TIVA and Propofol infusion  Airway Management Planned: Nasal Cannula and Natural Airway  Additional Equipment:   Intra-op Plan:   Post-operative Plan:   Informed Consent: I have reviewed the patients History and Physical, chart, labs and discussed the procedure including the risks, benefits and alternatives for the proposed anesthesia with the patient or authorized representative who has indicated his/her understanding and acceptance.     Dental advisory given  Plan Discussed with: CRNA and Surgeon  Anesthesia Plan Comments:         Anesthesia Quick Evaluation

## 2020-10-24 ENCOUNTER — Encounter: Payer: Self-pay | Admitting: Gastroenterology

## 2020-10-24 LAB — SURGICAL PATHOLOGY

## 2020-10-25 ENCOUNTER — Ambulatory Visit (INDEPENDENT_AMBULATORY_CARE_PROVIDER_SITE_OTHER): Payer: Medicare Other | Admitting: Internal Medicine

## 2020-10-25 ENCOUNTER — Encounter: Payer: Self-pay | Admitting: Internal Medicine

## 2020-10-25 ENCOUNTER — Telehealth: Payer: Self-pay | Admitting: Internal Medicine

## 2020-10-25 ENCOUNTER — Telehealth: Payer: Self-pay | Admitting: Gastroenterology

## 2020-10-25 ENCOUNTER — Other Ambulatory Visit: Payer: Self-pay

## 2020-10-25 VITALS — BP 126/82 | HR 84 | Temp 98.2°F | Ht 62.99 in | Wt 240.8 lb

## 2020-10-25 DIAGNOSIS — Z1231 Encounter for screening mammogram for malignant neoplasm of breast: Secondary | ICD-10-CM | POA: Diagnosis not present

## 2020-10-25 DIAGNOSIS — L509 Urticaria, unspecified: Secondary | ICD-10-CM | POA: Diagnosis not present

## 2020-10-25 DIAGNOSIS — R936 Abnormal findings on diagnostic imaging of limbs: Secondary | ICD-10-CM

## 2020-10-25 DIAGNOSIS — M1711 Unilateral primary osteoarthritis, right knee: Secondary | ICD-10-CM

## 2020-10-25 DIAGNOSIS — K220 Achalasia of cardia: Secondary | ICD-10-CM

## 2020-10-25 HISTORY — DX: Unilateral primary osteoarthritis, right knee: M17.11

## 2020-10-25 HISTORY — DX: Achalasia of cardia: K22.0

## 2020-10-25 NOTE — Progress Notes (Addendum)
No chief complaint on file.  F/u  1. Right knee pain MRI +mod to severe arthritis and ACL degneration had fall 10/14/20 and saw ortho 10/19/20 who rec knee brace and voltaren gel no injection and rtc if painful she has not tried either but advised rtc if sx's. She reports pain with standing and knee is popping and she had to sit at times pain moderate she takes chronic pain meds  2. C/w gastroparesis EGD 10/23/20 with food and pills will f/u Dr. Vicente Males 10/31/20 for gastric emptying study, esophageal manometry. She does c/o chest burning bxs taken and negative Advised to stop victoza as this could be side effect  3. Urticaria allergies saw [facility] allergy 10/19/20 and they disc xolair stop singulair ? Why, and HCQ pt wants injections also disc fasenra, xolair and dupixent and will see which one allergy agrees with given h/o of severe allergic reaction lip and eye and tongue swelling over years.  Also will ask if ok to Get Tdap and covid booster    ROS Past Medical History:  Diagnosis Date  . Allergic rhinitis   . Bipolar disorder ([place])   . Chronic low back pain   . CTS (carpal tunnel syndrome)    hands  . Depression   . Gallstones   . GERD (gastroesophageal reflux disease)   . History of prediabetes   . Hypertension   . Laceration of right foot   . Onychomycosis   . OSA on CPAP   . Overactive bladder   . PVC's (premature ventricular contractions)   . PVC's (premature ventricular contractions)    Dr. Raliegh Ip cards  . Seizures ([place])    8th grade   Past Surgical History:  Procedure Laterality Date  . ABDOMINAL HYSTERECTOMY  07/08/2011  . ABDOMINAL HYSTERECTOMY     supracervical in 07/2011   . CARPAL TUNNEL RELEASE     right hand 1996  . CESAREAN SECTION     2012  . CHOLECYSTECTOMY N/A 01/12/2018   Procedure: LAPAROSCOPIC CHOLECYSTECTOMY WITH INTRAOPERATIVE CHOLANGIOGRAM ERAS PATHWAY;  Surgeon: Johnathan Hausen, MD;  Location: [facility];  Service: General;  Laterality: N/A;  . COLONOSCOPY WITH PROPOFOL  N/A 04/15/2019   Procedure: COLONOSCOPY WITH PROPOFOL;  Surgeon: Jonathon Bellows, MD;  Location: [facility] ENDOSCOPY;  Service: Gastroenterology;  Laterality: N/A;  . DILATION AND CURETTAGE OF UTERUS     04/2011 benign endometrial polyp squamous metaplasia, inflamm, blood, mucous, benign   . ESOPHAGOGASTRODUODENOSCOPY (EGD) WITH PROPOFOL N/A 10/23/2020   Procedure: ESOPHAGOGASTRODUODENOSCOPY (EGD) WITH PROPOFOL;  Surgeon: Jonathon Bellows, MD;  Location: [facility] ENDOSCOPY;  Service: Gastroenterology;  Laterality: N/A;  . FOOT SURGERY     Dr. Prudence Davidson   . LEEP     2006  . TUBAL LIGATION     Family History  Problem Relation Age of Onset  . Cancer Father        esophageal   . Hypertension Father   . Heart failure Mother   . Hypertension Mother   . Early death Mother   . Heart disease Mother        chf  . Breast cancer Sister 58  . Cancer Sister        breast  . Depression Sister   . Mental illness Sister   . Asthma Son   . Cancer Other        brain cancer   Social History   Socioeconomic History  . Marital status: Married    Spouse name: Not on file  . Number  of children: Not on file  . Years of education: Not on file  . Highest education level: Not on file  Occupational History  . Not on file  Tobacco Use  . Smoking status: Never Smoker  . Smokeless tobacco: Current User    Types: Snuff  . Tobacco comment: Is in process of decreasing amount  Vaping Use  . Vaping Use: Never used  Substance and Sexual Activity  . Alcohol use: No    Alcohol/week: 0.0 standard drinks  . Drug use: No  . Sexual activity: Yes    Comment: men  Other Topics Concern  . Not on file  Social History Narrative   Married    No guns    Wears seat belt    Safe in relationship    Never smoker    2 sons Gaspar Bidding ~10 and another son ~28 as of 10/2019   1 granson eli 1 y.o as of 10/2019   2 sisters    Unemployed       Social Determinants of Health   Financial Resource Strain: Low Risk   . Difficulty of Paying  Living Expenses: Not hard at all  Food Insecurity: No Food Insecurity  . Worried About Charity fundraiser in the Last Year: Never true  . Ran Out of Food in the Last Year: Never true  Transportation Needs: No Transportation Needs  . Lack of Transportation (Medical): No  . Lack of Transportation (Non-Medical): No  Physical Activity: Not on file  Stress: No Stress Concern Present  . Feeling of Stress : Not at all  Social Connections: Unknown  . Frequency of Communication with Friends and Family: Not on file  . Frequency of Social Gatherings with Friends and Family: Not on file  . Attends Religious Services: Not on file  . Active Member of Clubs or Organizations: Not on file  . Attends Archivist Meetings: Not on file  . Marital Status: Married  Human resources officer Violence: Not At Risk  . Fear of Current or Ex-Partner: No  . Emotionally Abused: No  . Physically Abused: No  . Sexually Abused: No   Current Meds  Medication Sig  . amLODipine (NORVASC) 5 MG tablet Take 1 tablet (5 mg total) by mouth daily.  . benztropine (COGENTIN) 1 MG tablet Take 1 mg by mouth daily.  . Blood Glucose Calibration (TRUE METRIX LEVEL 1) Low SOLN   . cetirizine (ZYRTEC ALLERGY) 10 MG tablet Take 2 tablets (20 mg total) by mouth daily.  . clobetasol cream (TEMOVATE) 1.61 % Apply 1 application topically 2 (two) times daily.  Marland Kitchen EPINEPHrine 0.3 mg/0.3 mL IJ SOAJ injection Inject 0.3 mg into the muscle as needed for anaphylaxis.  . famotidine (PEPCID) 40 MG tablet Take 1 tablet (40 mg total) by mouth daily.  . fexofenadine (ALLEGRA) 180 MG tablet Take 2 tablets by mouth every morning.  . fexofenadine-pseudoephedrine (ALLEGRA-D) 60-120 MG 12 hr tablet Take 1 tablet by mouth 2 (two) times daily.  Marland Kitchen FLUoxetine (PROZAC) 10 MG capsule Take 10 mg by mouth daily.  . fluPHENAZine (PROLIXIN) 2.5 MG tablet Take 1 tablet (2.5 mg total) by mouth at bedtime.  . fluticasone (FLONASE) 50 MCG/ACT nasal spray Place 2  sprays into both nostrils daily.  . hydrocortisone 2.5 % lotion Apply topically 2 (two) times daily.  . hydrOXYzine (ATARAX/VISTARIL) 25 MG tablet Take 1 tablet (25 mg total) by mouth every 8 (eight) hours as needed.  . Insulin Pen Needle (PEN NEEDLES) 30G  X 8 MM MISC 1 Device by Does not apply route daily.  Marland Kitchen liraglutide (VICTOZA) 18 MG/3ML SOPN Inject 1.8 mg into the skin daily.  . Loperamide HCl (IMODIUM A-D PO) Take by mouth.  . metoprolol succinate (TOPROL-XL) 25 MG 24 hr tablet Take 1 tablet (25 mg total) by mouth daily. Take with or immediately following a meal.  . montelukast (SINGULAIR) 10 MG tablet Take 1 tablet (10 mg total) by mouth daily.  . Multiple Vitamins-Minerals (ALIVE MULTI-VITAMIN PO) Take by mouth daily in the afternoon.  . mupirocin ointment (BACTROBAN) 2 % Apply 1 application topically 3 (three) times daily. Scalp  . NON FORMULARY Zzzquil melatonin/ashwaghana 3 pills at night  . oxyCODONE-acetaminophen (PERCOCET) 10-325 MG tablet Take 1 tablet by mouth 3 (three) times daily as needed.  . Probiotic Product (PROBIOTIC DAILY PO) Take by mouth. probiogen probiotic daily  . topiramate (TOPAMAX) 100 MG tablet Take 100 mg by mouth daily.  Marland Kitchen triamcinolone cream (KENALOG) 0.1 % Apply 1 application topically 2 (two) times daily.  Marland Kitchen triamcinolone cream (KENALOG) 0.5 % Apply 1 application topically 3 (three) times daily.   Allergies  Allergen Reactions  . Cephalexin Swelling and Rash  . Hctz [Hydrochlorothiazide] Rash and Swelling    Mild she says  . Lisinopril Hives and Other (See Comments)    Headache, Low BP, angioedema    . Losartan Hives and Swelling    Angioedema  . Penicillins Rash    Has patient had a PCN reaction causing immediate rash, facial/tongue/throat swelling, SOB or lightheadedness with hypotension: Yes Has patient had a PCN reaction causing severe rash involving mucus membranes or skin necrosis: No Has patient had a PCN reaction that required  hospitalization: No Has patient had a PCN reaction occurring within the last 10 years: No If all of the above answers are "NO", then may proceed with Cephalosporin use.   . Bee Venom     Swelling   . Metformin And Related     Rash    Recent Results (from the past 2160 hour(s))  Group A Strep by PCR     Status: None   Collection Time: 08/15/20  3:37 PM   Specimen: Throat; Sterile Swab  Result Value Ref Range   Group A Strep by PCR NOT DETECTED NOT DETECTED    Comment: Performed at [facility], [address] [place], [place] [address]  SARS CORONAVIRUS 2 (TAT 6-24 HRS) Nasopharyngeal Nasopharyngeal Swab     Status: None   Collection Time: 08/15/20  3:37 PM   Specimen: Nasopharyngeal Swab  Result Value Ref Range   SARS Coronavirus 2 NEGATIVE NEGATIVE    Comment: (NOTE) SARS-CoV-2 target nucleic acids are NOT DETECTED.  The SARS-CoV-2 RNA is generally detectable in upper and lower respiratory specimens during the acute phase of infection. Negative results do not preclude SARS-CoV-2 infection, do not rule out co-infections with other pathogens, and should not be used as the sole basis for treatment or other patient management decisions. Negative results must be combined with clinical observations, patient history, and epidemiological information. The expected result is Negative.  Fact Sheet for Patients: SugarRoll.be  Fact Sheet for Healthcare Providers: https://www.woods-mathews.com/  This test is not yet approved or cleared by the [place] FDA and  has been authorized for detection and/or diagnosis of SARS-CoV-2 by FDA under an Emergency Use Authorization (EUA). This EUA will remain  in effect (meaning this test can be used) for the duration of the COVID-19 declaration under  Se ction 564(b)(1) of the Act, 21 U.S.C. section 360bbb-3(b)(1), unless the authorization is terminated or revoked sooner.  Performed at  [facility], [place] [address] [place], [place] [address]   Comprehensive metabolic panel     Status: Abnormal   Collection Time: 08/17/20  1:04 PM  Result Value Ref Range   Sodium 141 135 - 145 mEq/L   Potassium 3.6 3.5 - 5.1 mEq/L   Chloride 107 96 - 112 mEq/L   CO2 27 19 - 32 mEq/L   Glucose, Bld 109 (H) 70 - 99 mg/dL   BUN 10 6 - 23 mg/dL   Creatinine, Ser 0.96 0.40 - 1.20 mg/dL   Total Bilirubin 0.4 0.2 - 1.2 mg/dL   Alkaline Phosphatase 50 39 - 117 U/L   AST 12 0 - 37 U/L   ALT 19 0 - 35 U/L   Total Protein 6.1 6.0 - 8.3 g/dL   Albumin 3.8 3.5 - 5.2 g/dL   GFR 68.18 >60.00 mL/min    Comment: Calculated using the CKD-EPI Creatinine Equation (2021)   Calcium 9.0 8.4 - 10.5 mg/dL  Glucose 6 phosphate dehydrogenase     Status: None   Collection Time: 08/17/20  1:04 PM  Result Value Ref Range   G-6PDH 15.7 7.0 - 20.5 U/g Hgb  Hepatitis C antibody     Status: None   Collection Time: 08/17/20  2:24 PM  Result Value Ref Range   Hepatitis C Ab NON-REACTIVE NON-REACTI   SIGNAL TO CUT-OFF 0.01 <1.00    Comment: . HCV antibody was non-reactive. There is no laboratory  evidence of HCV infection. . In most cases, no further action is required. However, if recent HCV exposure is suspected, a test for HCV RNA (test code [phone]) is suggested. . For additional information please refer to http://education.questdiagnostics.com/faq/FAQ22v1 (This link is being provided for informational/ educational purposes only.) .   SARS CORONAVIRUS 2 (TAT 6-24 HRS) Nasopharyngeal Nasopharyngeal Swab     Status: None   Collection Time: 10/19/20 10:33 AM   Specimen: Nasopharyngeal Swab  Result Value Ref Range   SARS Coronavirus 2 NEGATIVE NEGATIVE    Comment: (NOTE) SARS-CoV-2 target nucleic acids are NOT DETECTED.  The SARS-CoV-2 RNA is generally detectable in upper and lower respiratory specimens during the acute phase of infection. Negative results do not preclude SARS-CoV-2 infection,  do not rule out co-infections with other pathogens, and should not be used as the sole basis for treatment or other patient management decisions. Negative results must be combined with clinical observations, patient history, and epidemiological information. The expected result is Negative.  Fact Sheet for Patients: SugarRoll.be  Fact Sheet for Healthcare Providers: https://www.woods-mathews.com/  This test is not yet approved or cleared by the [place] FDA and  has been authorized for detection and/or diagnosis of SARS-CoV-2 by FDA under an Emergency Use Authorization (EUA). This EUA will remain  in effect (meaning this test can be used) for the duration of the COVID-19 declaration under Se ction 564(b)(1) of the Act, 21 U.S.C. section 360bbb-3(b)(1), unless the authorization is terminated or revoked sooner.  Performed at [facility], [place] [address], [place], [place] [address]   Glucose, capillary     Status: None   Collection Time: 10/23/20  9:12 AM  Result Value Ref Range   Glucose-Capillary 86 70 - 99 mg/dL    Comment: Glucose reference range applies only to samples taken after fasting for at least 8 hours.  Surgical pathology  Status: None   Collection Time: 10/23/20 10:27 AM  Result Value Ref Range   SURGICAL PATHOLOGY      SURGICAL PATHOLOGY CASE: [id number] PATIENT: Heather Sweeney Surgical Pathology Report     Specimen Submitted: A. Esophagus; cbx  Clinical History: GERD K21.9 Dysphagia R13.10      DIAGNOSIS: A. ESOPHAGUS; COLD BIOPSY: - SQUAMOUS MUCOSA WITH NO SIGNIFICANT PATHOLOGIC ALTERATION. - NEGATIVE FOR INCREASED EOSINOPHILS. - NEGATIVE FOR INTESTINAL METAPLASIA, DYSPLASIA, AND MALIGNANCY.   GROSS DESCRIPTION: A. Labeled: Esophageal cbxs, rule out EOE Received: Formalin Collection time: 10:27 AM on 10/23/2020 Placed into formalin time: 10:27 AM on 10/23/2020 Tissue fragment(s):  Multiple Size: Aggregate, 1.4 x 0.5 x 0.1 cm Description: White-tan translucent soft tissue fragments Entirely submitted in 1 cassette.  Final Diagnosis performed by Betsy Pries, MD.   Electronically signed 10/24/2020 10:45:39AM The electronic signature indicates that the named Attending Pathologist has evaluated the specimen Technical component performed at [facility], [address] , [place], [place] [address] Lab: [phone] Dir: Rush Farmer, MD, MMM  Professional component performed at [facility], [facility] [place] [facility], [place], [place], [place] [address] Lab: [phone] Dir: Dellia Nims. Rubinas, MD    Objective  Body mass index is 42.67 kg/m. Wt Readings from Last 3 Encounters:  10/25/20 240 lb 12.8 oz (109.2 kg)  10/23/20 243 lb (110.2 kg)  10/11/20 236 lb 9.6 oz (107.3 kg)   Temp Readings from Last 3 Encounters:  10/25/20 98.2 F (36.8 C)  10/23/20 (!) 97 F (36.1 C) (Temporal)  10/11/20 98.6 F (37 C)   BP Readings from Last 3 Encounters:  10/25/20 126/82  10/23/20 126/81  10/11/20 118/64   Pulse Readings from Last 3 Encounters:  10/25/20 84  10/23/20 78  10/11/20 68    Physical Exam Vitals and nursing note reviewed.  Constitutional:      Appearance: Normal appearance. She is well-developed and well-groomed. She is obese.  HENT:     Head: Normocephalic and atraumatic.  Eyes:     Conjunctiva/sclera: Conjunctivae normal.     Pupils: Pupils are equal, round, and reactive to light.  Cardiovascular:     Rate and Rhythm: Normal rate and regular rhythm.     Heart sounds: Normal heart sounds. No murmur heard.   Pulmonary:     Effort: Pulmonary effort is normal.     Breath sounds: Normal breath sounds.  Musculoskeletal:     Right knee: Bony tenderness present. Tenderness present over the medial joint line, lateral joint line, MCL, LCL, ACL, PCL and patellar tendon.  Skin:    General: Skin is warm and dry.  Neurological:     General: No  focal deficit present.     Mental Status: She is alert and oriented to person, place, and time. Mental status is at baseline.     Gait: Gait normal.  Psychiatric:        Attention and Perception: Attention and perception normal.        Mood and Affect: Mood and affect normal.        Speech: Speech normal.        Behavior: Behavior normal. Behavior is cooperative.        Thought Content: Thought content normal.        Cognition and Memory: Cognition and memory normal.        Judgment: Judgment normal.     Assessment  Plan  Arthritis of right knee with abnormal MRI pain 03/01/20 and edema and gait change rx knee  brace and f/u [facility] declines f/u and wants to go ortho in [place]  MRI see below  IMPRESSION: 1. Intact ligamentous structures and no acute bony findings. Mild mucoid degeneration of the ACL. 2. No meniscal tears. 3. Moderate age advanced tricompartmental degenerative changes. 4. Small to moderate-sized joint effusion and very small Baker's cyst.   Electronically Signed   By: Marijo Sanes M.D.   On: 03/02/2020 12:03  Will refer to Dr. Alvan Dame or [facility]  Disc otc voltaren gel qid prn   Aperistalsis, esophagus C/w gastroparesis  Will f/u Dr. Vicente Males 10/31/20 at 8:15  ?GERD disc pepcid 20 mg bid and f/u GI   Urticaria  Disc [facility] sent cc  disc xolair stop singulair ? Why, and HCQ pt wants injections also disc fasenra, xolair and dupixent and will see which one allergy agrees with given h/o of severe allergic reaction lip and eye and tongue swelling over years.  Also will ask if ok to Get Tdap and covid booster    HM Declines flu shotallergic Tdap utd due 7/13/2020will do in future at f/u [place] 2/2 consider booster ask allergy about this  Allergic to pna vaccine Consider shingrix future Consider shingrix but given allergies use caution Not immuneMMR, hep B titer in futureuse caution though with h/o allergies with all vaccines 915/21 house calls visit  A1c 6.1  h/o supracervical hysterectomy ([facility]), pap 02/29/16 negative no comment HPV, another pap 06/23/17 neg pap neg HPV  -h/o LEEP 2006  mammo 08/04/19 negativeordered11/17/21 negative ordered 2022   Colonoscopy7/10/20 normal f/u in 10 years egd sch 10/23/20 Dr. Vicente Males neg bxs 10/23/20 aperistalsis and food + consider gastric emptying study and esophageal mamometry  10/25/20 Dr. Bailey Mech Dr Jacklynn Lewis,   I will see her next Wednesday 12/01/20 at 8:15 am- march I agree is way too long , I will order the tests at her office visit after I discuss about it with her .Thank you for reaching out to me.   Yes there has been some case reports of Victoza causing GI tract dysmotility - is holding the medication for a while an option?    Echo Dr.Kowalski  -nl LV function EF 60% moderate mitral insuff and mild tricuspid insufficiency treadmill stress test neg 07/30/13 -h/o PVCS  Hep C neg 08/17/20 HIV neg 02/04/16  Ob/gyn appt pending bladder leakage [facility]pped [facility] w/o help worsen sneezing or coughing) -Dr. Georgianne Fick pt wants surgeryas of 04/20/20 sx's better and no surgery indicated  Consider referral repeat sleep study h/o OSA   Provider: Dr. Olivia Mackie McLean-Scocuzza-Internal Medicine

## 2020-10-25 NOTE — Patient Instructions (Addendum)
Xolair, fasenra, dupixent You can try pepcid 20 mg 2x per day 30 min before food this is over the counter  Consider pfizer booster and Tdap vaccine but will check with allergy 1st    voltaren gel (over the counter)  4x per day right knee and wear knee brace Clovers  10/19/2020 Office Visit [facility]  [address]  [place], [place] [address]  [phone]  [address], [address]  [place], [place] [address]  [phone] (Work)  [phone] ([address])    [address], [place], [place] [address]  [phone] (Work)  [phone] (Fax)  Contusion of right knee and lower leg, initial encounter (Primary Dx);  Primary osteoarthritis of right knee;  Acute pain of right knee;  Morbid obesity with BMI of 40.0-44.9, adult (CMS-HCC)     Knee Exercises Ask your health care provider which exercises are safe for you. Do exercises exactly as told by your health care provider and adjust them as directed. It is normal to feel mild stretching, pulling, tightness, or discomfort as you do these exercises. Stop right away if you feel sudden pain or your pain gets worse. Do not begin these exercises until told by your health care provider. Stretching and range-of-motion exercises These exercises warm up your muscles and joints and improve the movement and flexibility of your knee. These exercises also help to relieve pain and swelling. Knee extension, prone 1. Lie on your abdomen (prone position) on a bed. 2. Place your left / right knee just beyond the edge of the surface so your knee is not on the bed. You can put a towel under your left / right thigh just above your kneecap for comfort. 3. Relax your leg muscles and allow gravity to straighten your knee (extension). You should feel a stretch behind your left / right knee. 4. Hold this position for __________ seconds. 5. Scoot up so your knee is supported between  repetitions. Repeat __________ times. Complete this exercise __________ times a day. Knee flexion, active 1. Lie on your back with both legs straight. If this causes back discomfort, bend your left / right knee so your foot is flat on the floor. 2. Slowly slide your left / right heel back toward your buttocks. Stop when you feel a gentle stretch in the front of your knee or thigh (flexion). 3. Hold this position for __________ seconds. 4. Slowly slide your left / right heel back to the starting position. Repeat __________ times. Complete this exercise __________ times a day.   Quadriceps stretch, prone 1. Lie on your abdomen on a firm surface, such as a bed or padded floor. 2. Bend your left / right knee and hold your ankle. If you cannot reach your ankle or pant leg, loop a belt around your foot and grab the belt instead. 3. Gently pull your heel toward your buttocks. Your knee should not slide out to the side. You should feel a stretch in the front of your thigh and knee (quadriceps). 4. Hold this position for __________ seconds. Repeat __________ times. Complete this exercise __________ times a day.   Hamstring, supine 1. Lie on your back (supine position). 2. Loop a belt or towel over the ball of your left / right foot. The ball of your foot is on the walking surface, right under your toes. 3. Straighten your left / right knee and slowly pull on the belt to raise your leg until  you feel a gentle stretch behind your knee (hamstring). ? Do not let your knee bend while you do this. ? Keep your other leg flat on the floor. 4. Hold this position for __________ seconds. Repeat __________ times. Complete this exercise __________ times a day. Strengthening exercises These exercises build strength and endurance in your knee. Endurance is the ability to use your muscles for a long time, even after they get tired. Quadriceps, isometric This exercise stretches the muscles in front of your thigh  (quadriceps) without moving your knee joint (isometric). 1. Lie on your back with your left / right leg extended and your other knee bent. Put a rolled towel or small pillow under your knee if told by your health care provider. 2. Slowly tense the muscles in the front of your left / right thigh. You should see your kneecap slide up toward your hip or see increased dimpling just above the knee. This motion will push the back of the knee toward the floor. 3. For __________ seconds, hold the muscle as tight as you can without increasing your pain. 4. Relax the muscles slowly and completely. Repeat __________ times. Complete this exercise __________ times a day.   Straight leg raises This exercise stretches the muscles in front of your thigh (quadriceps) and the muscles that move your hips (hip flexors). 1. Lie on your back with your left / right leg extended and your other knee bent. 2. Tense the muscles in the front of your left / right thigh. You should see your kneecap slide up or see increased dimpling just above the knee. Your thigh may even shake a bit. 3. Keep these muscles tight as you raise your leg 4-6 inches (10-15 cm) off the floor. Do not let your knee bend. 4. Hold this position for __________ seconds. 5. Keep these muscles tense as you lower your leg. 6. Relax your muscles slowly and completely after each repetition. Repeat __________ times. Complete this exercise __________ times a day. Hamstring, isometric 1. Lie on your back on a firm surface. 2. Bend your left / right knee about __________ degrees. 3. Dig your left / right heel into the surface as if you are trying to pull it toward your buttocks. Tighten the muscles in the back of your thighs (hamstring) to "dig" as hard as you can without increasing any pain. 4. Hold this position for __________ seconds. 5. Release the tension gradually and allow your muscles to relax completely for __________ seconds after each  repetition. Repeat __________ times. Complete this exercise __________ times a day. Hamstring curls If told by your health care provider, do this exercise while wearing ankle weights. Begin with __________ lb weights. Then increase the weight by 1 lb (0.5 kg) increments. Do not wear ankle weights that are more than __________ lb. 1. Lie on your abdomen with your legs straight. 2. Bend your left / right knee as far as you can without feeling pain. Keep your hips flat against the floor. 3. Hold this position for __________ seconds. 4. Slowly lower your leg to the starting position. Repeat __________ times. Complete this exercise __________ times a day.   Squats This exercise strengthens the muscles in front of your thigh and knee (quadriceps). 1. Stand in front of a table, with your feet and knees pointing straight ahead. You may rest your hands on the table for balance but not for support. 2. Slowly bend your knees and lower your hips like you are going to sit  in a chair. ? Keep your weight over your heels, not over your toes. ? Keep your lower legs upright so they are parallel with the table legs. ? Do not let your hips go lower than your knees. ? Do not bend lower than told by your health care provider. ? If your knee pain increases, do not bend as low. 3. Hold the squat position for __________ seconds. 4. Slowly push with your legs to return to standing. Do not use your hands to pull yourself to standing. Repeat __________ times. Complete this exercise __________ times a day. Wall slides This exercise strengthens the muscles in front of your thigh and knee (quadriceps). 1. Lean your back against a smooth wall or door, and walk your feet out 18-24 inches (46-61 cm) from it. 2. Place your feet hip-width apart. 3. Slowly slide down the wall or door until your knees bend __________ degrees. Keep your knees over your heels, not over your toes. Keep your knees in line with your hips. 4. Hold  this position for __________ seconds. Repeat __________ times. Complete this exercise __________ times a day.   Straight leg raises This exercise strengthens the muscles that rotate the leg at the hip and move it away from your body (hip abductors). 1. Lie on your side with your left / right leg in the top position. Lie so your head, shoulder, knee, and hip line up. You may bend your bottom knee to help you keep your balance. 2. Roll your hips slightly forward so your hips are stacked directly over each other and your left / right knee is facing forward. 3. Leading with your heel, lift your top leg 4-6 inches (10-15 cm). You should feel the muscles in your outer hip lifting. ? Do not let your foot drift forward. ? Do not let your knee roll toward the ceiling. 4. Hold this position for __________ seconds. 5. Slowly return your leg to the starting position. 6. Let your muscles relax completely after each repetition. Repeat __________ times. Complete this exercise __________ times a day.   Straight leg raises This exercise stretches the muscles that move your hips away from the front of the pelvis (hip extensors). 1. Lie on your abdomen on a firm surface. You can put a pillow under your hips if that is more comfortable. 2. Tense the muscles in your buttocks and lift your left / right leg about 4-6 inches (10-15 cm). Keep your knee straight as you lift your leg. 3. Hold this position for __________ seconds. 4. Slowly lower your leg to the starting position. 5. Let your leg relax completely after each repetition. Repeat __________ times. Complete this exercise __________ times a day. This information is not intended to replace advice given to you by your health care provider. Make sure you discuss any questions you have with your health care provider. Document Revised: 07/13/2018 Document Reviewed: 07/13/2018 Elsevier Patient Education  2021 [place] injection What is this  medicine? BENRALIZUMAB (BEN ra LIZ oo mab) is used to help treat severe asthma. It should be used in combination with other asthma treatments. This medicine may be used for other purposes; ask your health care provider or pharmacist if you have questions. COMMON BRAND NAME(S): Berna Bue What should I tell my health care provider before I take this medicine? They need to know if you have any of these conditions:  parasitic (helminth) infection  an unusual or allergic reaction to benralizumab, hamster proteins, other medicines,  foods, dyes, or preservatives  pregnant or trying to get pregnant  breast-feeding How should I use this medicine? This medicine is for injection under the skin. It may be administered by a healthcare professional in a hospital or clinic setting or at home. If you get this medicine at home, you will be taught how to prepare and give this medicine. Use exactly as directed. Take your medicine at regular intervals. Do not take your medicine more often than directed. It is important that you put your used injectors, needles and syringes in a special sharps container. Do not put them in a trash can. If you do not have a sharps container, call your pharmacist or healthcare provider to get one. Talk to your pediatrician regarding the use of this medicine in children. While this drug may be prescribed for children as young as 12 years for selected conditions, precautions do apply. Overdosage: If you think you have taken too much of this medicine contact a poison control center or emergency room at once. NOTE: This medicine is only for you. Do not share this medicine with others. What if I miss a dose? It is important not to miss your dose. Call your doctor of health care professional if you are unable to keep an appointment. If you give yourself the medicine and you miss a dose, call your doctor or health care professional for advice. What may interact with this  medicine? Interactions are not expected. This list may not describe all possible interactions. Give your health care provider a list of all the medicines, herbs, non-prescription drugs, or dietary supplements you use. Also tell them if you smoke, drink alcohol, or use illegal drugs. Some items may interact with your medicine. What should I watch for while using this medicine? Visit your healthcare professional for regular checks on your progress. NEVER use this medicine for an acute asthma attack. Tell your healthcare professional if your symptoms do not start to get better or if they get worse. Do not stop taking your other asthma medicines unless instructed to do so by your doctor or health care professional. What side effects may I notice from receiving this medicine? Side effects that you should report to your doctor or health care professional as soon as possible:  allergic reactions like skin rash, itching or hives, swelling of the face, lips, or tongue  breathing problems  signs and symptoms of low blood pressure like dizziness; feeling faint or lightheaded, falls  signs and symptoms of infection like fever or chills; cough; sore throat Side effects that usually do not require medical attention (report these to your doctor or health care professional if they continue or are bothersome):  headache  pain, redness, or irritation at the site where injected This list may not describe all possible side effects. Call your doctor for medical advice about side effects. You may report side effects to FDA at 1-800-FDA-1088. Where should I keep my medicine? Keep out of the reach of children. Store unopened syringes or injectors in a refrigerator between 2 to 8 degrees C (36 to 46 degrees F). Keep in the original container until ready for use. Protect from light. Do not freeze. Do not shake. Prior to use, remove the syringe or injector from the refrigerator and use after 30 minutes at room  temperature. Throw away any unused medicine after the expiration date on the label. If needed, the unopened syringe or injector may be stored at room temperature up to 25 degrees C (77  degrees F) for a maximum of 14 days. Once removed from the refrigerator and brought to room temperature, the syringe or autoinjector must be used within 14 days or discarded. NOTE: This sheet is a summary. It may not cover all possible information. If you have questions about this medicine, talk to your doctor, pharmacist, or health care provider.  2021 Elsevier/Gold Standard (2018-07-15 12:50:56)  Omalizumab Injection What is this medicine? OMALIZUMAB (oh mah lye ZOO mab) is used to help treat allergic asthma, chronic hives, and nasal polyps. It may be given with other treatments for these conditions. This medicine may be used for other purposes; ask your health care provider or pharmacist if you have questions. COMMON BRAND NAME(S): Xolair What should I tell my health care provider before I take this medicine? They need to know if you have any of these conditions:  cancer  have or have had a parasite infection  an unusual or allergic reaction to omalizumab, hamster proteins, latex, other medicines, foods, dyes, or preservatives  pregnant or trying to get pregnant  breast-feeding How should I use this medicine? This drug is injected under the skin. It is usually given by a health care provider in a hospital or clinic setting. It may also be given at home. If you get this medicine at home, you will be taught how to prepare and give it. Use exactly as directed. Take it as directed on the prescription label. Keep taking it unless your health care provider tells you to stop. It is important that you put your used needles and syringes in a special sharps container. Do not put them in a trash can. If you do not have a sharps container, call your pharmacist or healthcare provider to get one. This medicine comes  with INSTRUCTIONS FOR USE. Ask your pharmacist for directions on how to use this medicine. Read the information carefully. Talk to your pharmacist or health care provider if you have questions. A special MedGuide will be given to you before each treatment or by the pharmacist with each prescription and refill. Be sure to read this information carefully each time. Talk to your pediatrician regarding the use of this drug in children. While this drug may be prescribed for children as young as 6 years for selected conditions, precautions do apply. Overdosage: If you think you have taken too much of this medicine contact a poison control center or emergency room at once. NOTE: This medicine is only for you. Do not share this medicine with others. What if I miss a dose? If you get this medicine at the hospital or clinic: It is important not to miss your dose. Call your health care provider if you are unable to keep an appointment. If you give yourself this medicine at home: If you miss a dose, take it as soon as you can. Then continue your normal schedule. If it is almost time for your next dose, take only that dose. Do not take double or extra doses. Call your health care provider with questions. What may interact with this medicine? Interactions are not expected. This list may not describe all possible interactions. Give your health care provider a list of all the medicines, herbs, non-prescription drugs, or dietary supplements you use. Also tell them if you smoke, drink alcohol, or use illegal drugs. Some items may interact with your medicine. What should I watch for while using this medicine? After receiving each injection, you will be monitored in your doctor's office or clinic. Your  doctor will determine how long you will be observed after each injection. You may need blood work done while you are taking this medicine. Improvement of your condition will not be immediate. Improvements will occur  gradually. Do not stop taking any of your other treatments unless otherwise directed by your doctor. Some people who are at a high risk for parasite (worm) infections, get a parasite infection after receiving this drug. Your healthcare provider can test your stool to check for this type of infection if you are at risk. What side effects may I notice from receiving this medicine? Side effects that you should report to your doctor or health care professional as soon as possible:  allergic reactions like skin rash, itching or hives, swelling of the face, lips, or tongue  breathing problems  changes in vision  chest pain  pain, tingling, numbness in the hands or feet  sudden numbness or weakness of the face, arm or leg  symptoms of joint pain or swelling, fever, and rash  unusual bleeding or bruising  unusually weak or tired Side effects that usually do not require medical attention (report to your doctor or health care professional if they continue or are bothersome):  headache  nausea  pain, redness or irritation at site where injected  runny nose  sore throat This list may not describe all possible side effects. Call your doctor for medical advice about side effects. You may report side effects to FDA at 1-800-FDA-1088. Where should I keep my medicine? Keep out of the reach of children and pets. Store in the refrigerator. Do not freeze. Keep this medicine in the original container until you are ready to take it. Protect from light. Get rid of any unused medicine after the expiration date. To get rid of medicines that are no longer needed or have expired:  Take the medicine to a medicine take-back program. Check with your pharmacy or law enforcement to find a location.  If you cannot return the medicine, ask your pharmacist or health care provider how to get rid of this medicine safely. NOTE: This sheet is a summary. It may not cover all possible information. If you have  questions about this medicine, talk to your doctor, pharmacist, or health care provider.  2021 Elsevier/Gold Standard (2020-01-20 11:10:14)  Dupilumab injection What is this medicine? DUPILUMAB (doo PIL ue mab) is an injection used to treat certain patients with eczema, asthma, and sinus inflammation with nasal polyps. This medicine may be used for other purposes; ask your health care provider or pharmacist if you have questions. COMMON BRAND NAME(S): DUPIXENT What should I tell my health care provider before I take this medicine? They need to know if you have any of these conditions:  asthma  parasitic (helminth) infection  an unusual or allergic reaction to dupilumab, other medicines, foods, dyes, or preservatives  pregnant or trying to get pregnant  breast-feeding How should I use this medicine? This medicine is for injection under the skin. You will be taught how to prepare and give this medicine. Use exactly as directed. Take your medicine at regular intervals. Do not take your medicine more often than directed. It is important that you put your used needles and syringes in a special sharps container. Do not put them in a trash can. If you do not have a sharps container, call your pharmacist or healthcare provider to get one. Talk to your pediatrician regarding the use of this medicine in children. While this medicine may be  prescribed for children as young as 6 years for selected conditions, precautions do apply. Overdosage: If you think you have taken too much of this medicine contact a poison control center or emergency room at once. NOTE: This medicine is only for you. Do not share this medicine with others. What if I miss a dose? If you miss a dose, take it as soon as you can if it is within 7 days from the missed dose. If it is more than 7 days from your last dose and you are on an every other week dosing schedule, skip the dose and wait to take the next dose on the original  schedule. If it is more than 7 days from your last dose and you are on an every 4-week dosing schedule, take a dose as soon as possible and start a new schedule based on this date. Do not take double or extra doses. What may interact with this medicine?  vaccines  warfarin This list may not describe all possible interactions. Give your health care provider a list of all the medicines, herbs, non-prescription drugs, or dietary supplements you use. Also tell them if you smoke, drink alcohol, or use illegal drugs. Some items may interact with your medicine. What should I watch for while using this medicine? Tell your doctor or healthcare professional if your symptoms do not start to get better or if they get worse. You should not receive certain vaccines while using this medicine. Dupilumab may prevent a vaccine from working. Talk to your health care provider if you need to receive a vaccine while using this medicine. What side effects may I notice from receiving this medicine? Side effects that you should report to your doctor or health care professional as soon as possible:  allergic reactions like skin rash, itching or hives, swelling of the face, lips, or tongue  changes in vision  eye pain or swelling Side effects that usually do not require medical attention (report these to your doctor or health care professional if they continue or are bothersome):  cold sores  dry or itching eyes  pain, redness, or irritation at site where injected This list may not describe all possible side effects. Call your doctor for medical advice about side effects. You may report side effects to FDA at 1-800-FDA-1088. Where should I keep my medicine? Keep out of the reach of children. Store this medicine in the refrigerator between 2 and 8 degrees C (36 and 46 degrees F) until you are ready to prepare your injection; do not freeze. You may also store at room temperature for up to 14 days if needed. Do not  store above 25 degrees C (77 degrees F) or expose to heat. Throw away any unused medicine after the expiration date. NOTE: This sheet is a summary. It may not cover all possible information. If you have questions about this medicine, talk to your doctor, pharmacist, or health care provider.  2021 Elsevier/Gold Standard (2019-03-28 11:55:26)

## 2020-10-25 NOTE — Telephone Encounter (Signed)
-----   Message from Storm Frisk, [place] sent at 10/25/2020 12:53 PM EST ----- Regarding: appointment Joan Mayans,  Can you schedule this patient for next Wednesday at 8:15 am per Dr. Vicente Males.   Thanks

## 2020-10-25 NOTE — Telephone Encounter (Signed)
Patient was called and rescheduled for follow up on 1.26.22 @8 :15. Pt confirmed appt. FYI

## 2020-10-25 NOTE — Telephone Encounter (Signed)
Sent pt my chart.

## 2020-10-30 ENCOUNTER — Encounter: Admit: 2020-10-30 | Discharge: 2020-10-31 | Payer: MEDICARE

## 2020-10-30 DIAGNOSIS — L729 Follicular cyst of the skin and subcutaneous tissue, unspecified: Principal | ICD-10-CM

## 2020-10-31 ENCOUNTER — Other Ambulatory Visit: Payer: Self-pay

## 2020-10-31 ENCOUNTER — Ambulatory Visit (INDEPENDENT_AMBULATORY_CARE_PROVIDER_SITE_OTHER): Payer: Medicare Other | Admitting: Gastroenterology

## 2020-10-31 DIAGNOSIS — K224 Dyskinesia of esophagus: Secondary | ICD-10-CM

## 2020-10-31 DIAGNOSIS — R1310 Dysphagia, unspecified: Secondary | ICD-10-CM

## 2020-10-31 NOTE — Patient Instructions (Signed)
During today's visit Dr. Vicente Males recommended purchasing a Wedge Pillow.  These can be purchased online, [place], [employer].    F/U in 6 weeks for Virtual visit.  Have a great day!  [place], GI

## 2020-10-31 NOTE — Progress Notes (Signed)
Jonathon Bellows MD, MRCP(U.K) [address]  [place]  [place], [place] [address]  Main: [phone]  Fax: [phone]   Primary Care Physician: McLean-Scocuzza, Nino Glow, MD  Primary Gastroenterologist:  Dr. Jonathon Bellows   Dysphagia follow up   HPI: Heather Sweeney is a 55 y.o. female   Summary of history : She was last seen on 10/10/2020 for dysphagia which has been going on for many months if not longer.  Similar issue many years back and she had a endoscopy and dilation of her esophagus.  She does have a history of heartburn which she uses Tums as needed.  She uses snuff which she puts underneath her lower lip for many years.  Her father had a history of esophageal cancer.  He was a non-smoker.  The patient denies any weight loss.  Liquids are not affected.  Feels like a knot in the center of her chest.  04/15/2019: colonoscopy :normal   Interval history   10/10/2020-10/31/2020  10/26/2020: EGD: The stomach and duodenum appeared normal.  There was significantly impaired motility of the esophagus.  There was residual food debris in the lumen.  Biopsies esophagus showed no evidence of eosinophils Still has difficulty with swallowing solids and liquids.  She is on oxycodone for musculoskeletal pain  Current Outpatient Medications  Medication Sig Dispense Refill  . amLODipine (NORVASC) 5 MG tablet Take 1 tablet (5 mg total) by mouth daily. 90 tablet 3  . benztropine (COGENTIN) 1 MG tablet Take 1 mg by mouth daily.    . Blood Glucose Calibration (TRUE METRIX LEVEL 1) Low SOLN     . cetirizine (ZYRTEC ALLERGY) 10 MG tablet Take 2 tablets (20 mg total) by mouth daily. 30 tablet 0  . clobetasol cream (TEMOVATE) 5.46 % Apply 1 application topically 2 (two) times daily.    Marland Kitchen EPINEPHrine 0.3 mg/0.3 mL IJ SOAJ injection Inject 0.3 mg into the muscle as needed for anaphylaxis. 2 each 3  . famotidine (PEPCID) 40 MG tablet Take 1 tablet (40 mg total) by mouth daily. 30 tablet 3  . fexofenadine  (ALLEGRA) 180 MG tablet Take 2 tablets by mouth every morning.    . fexofenadine-pseudoephedrine (ALLEGRA-D) 60-120 MG 12 hr tablet Take 1 tablet by mouth 2 (two) times daily.    Marland Kitchen FLUoxetine (PROZAC) 10 MG capsule Take 10 mg by mouth daily.    . fluPHENAZine (PROLIXIN) 2.5 MG tablet Take 1 tablet (2.5 mg total) by mouth at bedtime.    . fluticasone (FLONASE) 50 MCG/ACT nasal spray Place 2 sprays into both nostrils daily. 48 g 3  . hydrocortisone 2.5 % lotion Apply topically 2 (two) times daily.    . hydrOXYzine (ATARAX/VISTARIL) 25 MG tablet Take 1 tablet (25 mg total) by mouth every 8 (eight) hours as needed. 90 tablet 3  . Insulin Pen Needle (PEN NEEDLES) 30G X 8 MM MISC 1 Device by Does not apply route daily. 90 each 3  . liraglutide (VICTOZA) 18 MG/3ML SOPN Inject 1.8 mg into the skin daily. 15 mL 1  . Loperamide HCl (IMODIUM A-D PO) Take by mouth.    . metoprolol succinate (TOPROL-XL) 25 MG 24 hr tablet Take 1 tablet (25 mg total) by mouth daily. Take with or immediately following a meal. 90 tablet 3  . montelukast (SINGULAIR) 10 MG tablet Take 1 tablet (10 mg total) by mouth daily. 90 tablet 1  . Multiple Vitamins-Minerals (ALIVE MULTI-VITAMIN PO) Take by mouth daily in the afternoon.    Marland Kitchen  mupirocin ointment (BACTROBAN) 2 % Apply 1 application topically 3 (three) times daily. Scalp 22 g 0  . NON FORMULARY Zzzquil melatonin/ashwaghana 3 pills at night    . oxyCODONE-acetaminophen (PERCOCET) 10-325 MG tablet Take 1 tablet by mouth 3 (three) times daily as needed.    . Probiotic Product (PROBIOTIC DAILY PO) Take by mouth. probiogen probiotic daily    . topiramate (TOPAMAX) 100 MG tablet Take 100 mg by mouth daily.    Marland Kitchen triamcinolone cream (KENALOG) 0.1 % Apply 1 application topically 2 (two) times daily.    Marland Kitchen triamcinolone cream (KENALOG) 0.5 % Apply 1 application topically 3 (three) times daily.     No current facility-administered medications for this visit.    Allergies as of  10/31/2020 - Review Complete 10/31/2020  Allergen Reaction Noted  . Cephalexin Swelling and Rash 11/01/2015  . Hctz [hydrochlorothiazide] Rash and Swelling 11/01/2015  . Lisinopril Hives and Other (See Comments) 11/01/2015  . Losartan Hives and Swelling 07/08/2017  . Penicillins Rash 11/01/2015  . Bee venom  12/27/2018  . Metformin and related  02/14/2019    ROS:  General: Negative for anorexia, weight loss, fever, chills, fatigue, weakness. ENT: Negative for hoarseness, difficulty swallowing , nasal congestion. CV: Negative for chest pain, angina, palpitations, dyspnea on exertion, peripheral edema.  Respiratory: Negative for dyspnea at rest, dyspnea on exertion, cough, sputum, wheezing.  GI: See history of present illness. GU:  Negative for dysuria, hematuria, urinary incontinence, urinary frequency, nocturnal urination.  Endo: Negative for unusual weight change.    Physical Examination:   There were no vitals taken for this visit.  General: Well-nourished, well-developed in no acute distress.  Neuro: Alert and oriented x 3.  Grossly intact. Skin: Warm and dry, no jaundice.   Psych: Alert and cooperative, normal mood and affect.   Imaging Studies: No results found.  Assessment and Plan:   Heather Sweeney is a 55 y.o. y/o female here to follow-up for dysphagia.  History of acid reflux.  Uses snuff daily which she places under her lower lip.  EGD showed features of esophageal dysmotility in the body of the esophagus.  No visible stricture or hypertonic lower esophageal sphincter.  I discussed with her that the etiology for esophageal dysmotility could be secondary to oxycodone use, review of the literature has also suggested that there are a few case reports of Victoza causing GI tract dysmotility.  Plan 1.  As a first step I would suggest to stop the oxycodone and consider stopping Victoza.  If better after this then no further intervention would be needed although  alternative analgesics and medications for diabetes will be needed.  If no better after cessation of these medications then will require esophageal manometry to be performed at [facility]. 2.    Continue on Pepcid 40 mg a day. 3.  Suggest to eat her food with multiple meals, small quantities each time and washing it down with fluids/carbonated beverage.  After meals suggest to sit up for 2 hours upright to allow gravity to help clear the esophagus of any debris    Dr Jonathon Bellows  MD,MRCP [facility]) Follow up in 6 to 8 weeks to discuss response to cessation of the medications

## 2020-11-11 ENCOUNTER — Encounter: Payer: Self-pay | Admitting: Gastroenterology

## 2020-11-13 ENCOUNTER — Encounter: Admit: 2020-11-13 | Discharge: 2020-11-14 | Payer: MEDICARE

## 2020-11-13 DIAGNOSIS — D492 Neoplasm of unspecified behavior of bone, soft tissue, and skin: Principal | ICD-10-CM

## 2020-11-13 DIAGNOSIS — L508 Other urticaria: Principal | ICD-10-CM

## 2020-11-13 DIAGNOSIS — L7211 Pilar cyst: Secondary | ICD-10-CM | POA: Diagnosis not present

## 2020-11-13 MED ORDER — OMALIZUMAB 150 MG/ML SUBCUTANEOUS SYRINGE
SUBCUTANEOUS | 12 refills | 28 days | Status: CP
Start: 2020-11-13 — End: ?

## 2020-11-20 ENCOUNTER — Ambulatory Visit: Admit: 2020-11-20 | Discharge: 2020-11-21 | Payer: MEDICARE

## 2020-11-20 DIAGNOSIS — L508 Other urticaria: Principal | ICD-10-CM

## 2020-11-20 MED ORDER — CLOBETASOL 0.05 % TOPICAL CREAM
OPHTHALMIC | 0 refills | 0.00000 days | Status: CP
Start: 2020-11-20 — End: ?

## 2020-11-20 MED ORDER — HYDROCORTISONE 2.5 % LOTION
0 refills | 0.00000 days | Status: CP
Start: 2020-11-20 — End: ?

## 2020-11-26 DIAGNOSIS — M47816 Spondylosis without myelopathy or radiculopathy, lumbar region: Secondary | ICD-10-CM | POA: Diagnosis not present

## 2020-11-26 DIAGNOSIS — Z5181 Encounter for therapeutic drug level monitoring: Secondary | ICD-10-CM | POA: Diagnosis not present

## 2020-11-26 DIAGNOSIS — G894 Chronic pain syndrome: Secondary | ICD-10-CM | POA: Diagnosis not present

## 2020-11-26 DIAGNOSIS — Z79891 Long term (current) use of opiate analgesic: Secondary | ICD-10-CM | POA: Diagnosis not present

## 2020-11-26 DIAGNOSIS — M5451 Vertebrogenic low back pain: Secondary | ICD-10-CM | POA: Diagnosis not present

## 2020-11-26 DIAGNOSIS — M25569 Pain in unspecified knee: Secondary | ICD-10-CM | POA: Diagnosis not present

## 2020-11-26 DIAGNOSIS — M797 Fibromyalgia: Secondary | ICD-10-CM | POA: Diagnosis not present

## 2020-12-03 DIAGNOSIS — M1711 Unilateral primary osteoarthritis, right knee: Secondary | ICD-10-CM | POA: Diagnosis not present

## 2020-12-04 ENCOUNTER — Telehealth: Payer: Self-pay

## 2020-12-04 ENCOUNTER — Encounter: Admit: 2020-12-04 | Discharge: 2020-12-05 | Payer: MEDICARE

## 2020-12-04 DIAGNOSIS — L509 Urticaria, unspecified: Principal | ICD-10-CM

## 2020-12-04 NOTE — Chronic Care Management (AMB) (Signed)
  Care Management   Note  12/04/2020 Name: Heather Sweeney MRN: [id number] DOB: [birth year]  Heather Sweeney is a 55 y.o. year old female who is a primary care patient of McLean-Scocuzza, Nino Glow, MD and is actively engaged with the care management team. I reached out to Heather Sweeney by phone today to assist with scheduling a follow up visit with the Pharmacist  Follow up plan: Face to Face appointment with care management team member scheduled for: 12/27/2020  Heather Sweeney, [place], [place], [place] [address] Direct Dial: [phone] [email or web address]

## 2020-12-24 ENCOUNTER — Other Ambulatory Visit: Payer: Self-pay

## 2020-12-24 ENCOUNTER — Other Ambulatory Visit: Payer: Self-pay | Admitting: Gastroenterology

## 2020-12-25 ENCOUNTER — Other Ambulatory Visit: Payer: Self-pay

## 2020-12-25 ENCOUNTER — Ambulatory Visit (INDEPENDENT_AMBULATORY_CARE_PROVIDER_SITE_OTHER): Payer: Medicare Other | Admitting: Internal Medicine

## 2020-12-25 ENCOUNTER — Encounter: Payer: Self-pay | Admitting: Internal Medicine

## 2020-12-25 ENCOUNTER — Telehealth (INDEPENDENT_AMBULATORY_CARE_PROVIDER_SITE_OTHER): Payer: Medicare Other | Admitting: Gastroenterology

## 2020-12-25 ENCOUNTER — Ambulatory Visit
Admission: RE | Admit: 2020-12-25 | Discharge: 2020-12-25 | Disposition: A | Discharge status: Home / Self Care | Payer: Medicare Other | Source: Ambulatory Visit | Attending: Internal Medicine | Admitting: Internal Medicine

## 2020-12-25 VITALS — BP 122/78 | HR 70 | Temp 98.1°F | Ht 62.99 in | Wt 241.4 lb

## 2020-12-25 DIAGNOSIS — K224 Dyskinesia of esophagus: Secondary | ICD-10-CM

## 2020-12-25 DIAGNOSIS — M7989 Other specified soft tissue disorders: Secondary | ICD-10-CM | POA: Insufficient documentation

## 2020-12-25 DIAGNOSIS — M79604 Pain in right leg: Secondary | ICD-10-CM | POA: Diagnosis not present

## 2020-12-25 DIAGNOSIS — M1711 Unilateral primary osteoarthritis, right knee: Secondary | ICD-10-CM | POA: Diagnosis not present

## 2020-12-25 DIAGNOSIS — R1310 Dysphagia, unspecified: Secondary | ICD-10-CM | POA: Diagnosis not present

## 2020-12-25 DIAGNOSIS — M7121 Synovial cyst of popliteal space [Baker], right knee: Secondary | ICD-10-CM | POA: Diagnosis not present

## 2020-12-25 DIAGNOSIS — M25461 Effusion, right knee: Secondary | ICD-10-CM | POA: Diagnosis not present

## 2020-12-25 MED ORDER — FAMOTIDINE 40 MG PO TABS: 40.0000 mg | ORAL_TABLET | Freq: Every day | ORAL | 1 refills | Status: DC

## 2020-12-25 NOTE — Progress Notes (Signed)
Chief Complaint  Patient presents with   Leg Swelling   F/u  1. Right knee pain 10/10 worse with ROM/ sitting to standing and injections not helping last had [facility] 12/03/20 she has had 2 falls and limping due to pain knee brace makes worse no support in back of right knee tried penetrex, salonpas, voltaren, Tiger balm w/o relief and on pain meds  Abnormal MRI 02/2020 IMPRESSION: 1. Intact ligamentous structures and no acute bony findings. Mild mucoid degeneration of the ACL. 2. No meniscal tears. 3. Moderate age advanced tricompartmental degenerative changes. 4. Small to moderate-sized joint effusion and very small Baker's cyst.   Electronically Signed   By: Marijo Sanes M.D.   On: 03/02/2020 12:03   Review of Systems  Constitutional: Negative for weight loss.  HENT: Negative for hearing loss.   Eyes: Negative for blurred vision.  Respiratory: Negative for shortness of breath.   Cardiovascular: Positive for leg swelling. Negative for chest pain.  Gastrointestinal: Negative for abdominal pain.  Musculoskeletal: Positive for falls and joint pain.  Skin: Negative for rash.  Psychiatric/Behavioral: Negative for depression.   Past Medical History:  Diagnosis Date   Allergic rhinitis    Bipolar disorder ([place])    Chronic low back pain    CTS (carpal tunnel syndrome)    hands   Depression    Gallstones    GERD (gastroesophageal reflux disease)    History of prediabetes    Hypertension    Laceration of right foot    Onychomycosis    OSA on CPAP    Overactive bladder    PVC's (premature ventricular contractions)    PVC's (premature ventricular contractions)    Dr. Raliegh Ip cards   Seizures [facility])    8th grade   Past Surgical History:  Procedure Laterality Date   ABDOMINAL HYSTERECTOMY  07/08/2011   ABDOMINAL HYSTERECTOMY     supracervical in 07/2011    [facility]     right hand [place]     2012   CHOLECYSTECTOMY N/A  01/12/2018   Procedure: LAPAROSCOPIC CHOLECYSTECTOMY WITH INTRAOPERATIVE CHOLANGIOGRAM ERAS PATHWAY;  Surgeon: Johnathan Hausen, MD;  Location: [facility] ORS;  Service: General;  Laterality: N/A;   COLONOSCOPY WITH PROPOFOL N/A 04/15/2019   Procedure: COLONOSCOPY WITH PROPOFOL;  Surgeon: Jonathon Bellows, MD;  Location: [facility] ENDOSCOPY;  Service: Gastroenterology;  Laterality: N/A;   DILATION AND CURETTAGE OF UTERUS     04/2011 benign endometrial polyp squamous metaplasia, inflamm, blood, mucous, benign    ESOPHAGOGASTRODUODENOSCOPY (EGD) WITH PROPOFOL N/A 10/23/2020   Procedure: ESOPHAGOGASTRODUODENOSCOPY (EGD) WITH PROPOFOL;  Surgeon: Jonathon Bellows, MD;  Location: [facility] ENDOSCOPY;  Service: Gastroenterology;  Laterality: N/A;   FOOT SURGERY     Dr. Prudence Davidson    LEEP     2006   TUBAL LIGATION     Family History  Problem Relation Age of Onset   Cancer Father        esophageal    Hypertension Father    Heart failure Mother    Hypertension Mother    Early death Mother    Heart disease Mother        chf   Breast cancer Sister 22   Cancer Sister        breast   Depression Sister    Mental illness Sister    Asthma Son    Cancer Other        brain cancer   Social History   Socioeconomic History  Marital status: Married    Spouse name: Not on file   Number of children: Not on file   Years of education: Not on file   Highest education level: Not on file  Occupational History   Not on file  Tobacco Use   Smoking status: Never Smoker   Smokeless tobacco: Current User    Types: Snuff   Tobacco comment: Is in process of decreasing amount  Vaping Use   Vaping Use: Never used  Substance and Sexual Activity   Alcohol use: No    Alcohol/week: 0.0 standard drinks   Drug use: No   Sexual activity: Yes    Comment: men  Other Topics Concern   Not on file  Social History Narrative   Married    No guns    Wears seat belt    Safe in relationship    Never smoker    2  sons Gaspar Bidding ~10 and another son ~28 as of 10/2019   1 granson eli 1 y.o as of 10/2019   2 sisters    Unemployed       Social Determinants of Health   Financial Resource Strain: Low Risk    Difficulty of Paying Living Expenses: Not hard at all  Food Insecurity: No Food Insecurity   Worried About Charity fundraiser in the Last Year: Never true   Arboriculturist in the Last Year: Never true  Transportation Needs: No Transportation Needs   Lack of Transportation (Medical): No   Lack of Transportation (Non-Medical): No  Physical Activity: Not on file  Stress: No Stress Concern Present   Feeling of Stress : Not at all  Social Connections: Unknown   Frequency of Communication with Friends and Family: Not on file   Frequency of Social Gatherings with Friends and Family: Not on file   Attends Religious Services: Not on file   Active Member of Clubs or Organizations: Not on file   Attends Archivist Meetings: Not on file   Marital Status: Married  Human resources officer Violence: Not At Risk   Fear of Current or Ex-Partner: No   Emotionally Abused: No   Physically Abused: No   Sexually Abused: No   Current Meds  Medication Sig   amLODipine (NORVASC) 5 MG tablet Take 1 tablet (5 mg total) by mouth daily.   benztropine (COGENTIN) 1 MG tablet Take 1 mg by mouth daily.   Biotin w/ Vitamins C & E (HAIR/SKIN/NAILS PO) Take 2,500 mcg by mouth daily.   Blood Glucose Calibration (TRUE METRIX LEVEL 1) Low SOLN    cetirizine (ZYRTEC ALLERGY) 10 MG tablet Take 2 tablets (20 mg total) by mouth daily.   clobetasol cream (TEMOVATE) 0.88 % Apply 1 application topically 2 (two) times daily.   EPINEPHrine 0.3 mg/0.3 mL IJ SOAJ injection Inject 0.3 mg into the muscle as needed for anaphylaxis.   famotidine (PEPCID) 40 MG tablet Take 1 tablet (40 mg total) by mouth daily.   fexofenadine (ALLEGRA) 180 MG tablet Take 2 tablets by mouth every morning.    fexofenadine-pseudoephedrine (ALLEGRA-D) 60-120 MG 12 hr tablet Take 1 tablet by mouth 2 (two) times daily.   FIBER ADULT GUMMIES PO Take by mouth.   FLUoxetine (PROZAC) 10 MG capsule Take 10 mg by mouth daily.   fluPHENAZine (PROLIXIN) 2.5 MG tablet Take 1 tablet (2.5 mg total) by mouth at bedtime.   fluticasone (FLONASE) 50 MCG/ACT nasal spray Place 2 sprays into both nostrils daily.  hydrocortisone 2.5 % lotion Apply topically 2 (two) times daily.   hydrOXYzine (ATARAX/VISTARIL) 25 MG tablet Take 1 tablet (25 mg total) by mouth every 8 (eight) hours as needed.   Insulin Pen Needle (PEN NEEDLES) 30G X 8 MM MISC 1 Device by Does not apply route daily.   liraglutide (VICTOZA) 18 MG/3ML SOPN Inject 1.8 mg into the skin daily.   Loperamide HCl (IMODIUM A-D PO) Take by mouth.   Melatonin 10 MG TABS Take by mouth at bedtime.   metoprolol succinate (TOPROL-XL) 25 MG 24 hr tablet Take 1 tablet (25 mg total) by mouth daily. Take with or immediately following a meal.   montelukast (SINGULAIR) 10 MG tablet Take 1 tablet (10 mg total) by mouth daily.   Multiple Vitamins-Minerals (ALIVE MULTI-VITAMIN PO) Take by mouth daily in the afternoon.   mupirocin ointment (BACTROBAN) 2 % Apply 1 application topically 3 (three) times daily. Scalp   NON FORMULARY Zzzquil melatonin/ashwaghana 3 pills at night   omalizumab Arvid Right) 150 MG/ML prefilled syringe Inject 300 mg into the skin every 30 (thirty) days.   oxyCODONE-acetaminophen (PERCOCET) 10-325 MG tablet Take 1 tablet by mouth 3 (three) times daily as needed.   Probiotic Product (PROBIOTIC DAILY PO) Take by mouth. probiogen probiotic daily   topiramate (TOPAMAX) 100 MG tablet Take 100 mg by mouth daily.   triamcinolone cream (KENALOG) 0.1 % Apply 1 application topically 2 (two) times daily.   triamcinolone cream (KENALOG) 0.5 % Apply 1 application topically 3 (three) times daily.   Allergies  Allergen Reactions   Cephalexin Swelling  and Rash   Hctz [Hydrochlorothiazide] Rash and Swelling    Mild she says   Lisinopril Hives and Other (See Comments)    Headache, Low BP, angioedema     Losartan Hives and Swelling    Angioedema   Penicillins Rash    Has patient had a PCN reaction causing immediate rash, facial/tongue/throat swelling, SOB or lightheadedness with hypotension: Yes Has patient had a PCN reaction causing severe rash involving mucus membranes or skin necrosis: No Has patient had a PCN reaction that required hospitalization: No Has patient had a PCN reaction occurring within the last 10 years: No If all of the above answers are "NO", then may proceed with Cephalosporin use.    Bee Venom     Swelling    Metformin And Related     Rash    Recent Results (from the past 2160 hour(s))  SARS CORONAVIRUS 2 (TAT 6-24 HRS) Nasopharyngeal Nasopharyngeal Swab     Status: None   Collection Time: 10/19/20 10:33 AM   Specimen: Nasopharyngeal Swab  Result Value Ref Range   SARS Coronavirus 2 NEGATIVE NEGATIVE    Comment: (NOTE) SARS-CoV-2 target nucleic acids are NOT DETECTED.  The SARS-CoV-2 RNA is generally detectable in upper and lower respiratory specimens during the acute phase of infection. Negative results do not preclude SARS-CoV-2 infection, do not rule out co-infections with other pathogens, and should not be used as the sole basis for treatment or other patient management decisions. Negative results must be combined with clinical observations, patient history, and epidemiological information. The expected result is Negative.  Fact Sheet for Patients: SugarRoll.be  Fact Sheet for Healthcare Providers: https://www.woods-mathews.com/  This test is not yet approved or cleared by the [place] FDA and  has been authorized for detection and/or diagnosis of SARS-CoV-2 by FDA under an Emergency Use Authorization (EUA). This EUA will remain  in effect  (meaning this test can be  used) for the duration of the COVID-19 declaration under Se ction 564(b)(1) of the Act, 21 U.S.C. section 360bbb-3(b)(1), unless the authorization is terminated or revoked sooner.  Performed at [facility], [place] [address] [place], [place] [address]   Glucose, capillary     Status: None   Collection Time: 10/23/20  9:12 AM  Result Value Ref Range   Glucose-Capillary 86 70 - 99 mg/dL    Comment: Glucose reference range applies only to samples taken after fasting for at least 8 hours.  Surgical pathology     Status: None   Collection Time: 10/23/20 10:27 AM  Result Value Ref Range   SURGICAL PATHOLOGY      SURGICAL PATHOLOGY CASE: [id number] PATIENT: Heather Sweeney Surgical Pathology Report     Specimen Submitted: A. Esophagus; cbx  Clinical History: GERD K21.9 Dysphagia R13.10      DIAGNOSIS: A. ESOPHAGUS; COLD BIOPSY: - SQUAMOUS MUCOSA WITH NO SIGNIFICANT PATHOLOGIC ALTERATION. - NEGATIVE FOR INCREASED EOSINOPHILS. - NEGATIVE FOR INTESTINAL METAPLASIA, DYSPLASIA, AND MALIGNANCY.   GROSS DESCRIPTION: A. Labeled: Esophageal cbxs, rule out EOE Received: Formalin Collection time: 10:27 AM on 10/23/2020 Placed into formalin time: 10:27 AM on 10/23/2020 Tissue fragment(s): Multiple Size: Aggregate, 1.4 x 0.5 x 0.1 cm Description: White-tan translucent soft tissue fragments Entirely submitted in 1 cassette.  Final Diagnosis performed by Betsy Pries, MD.   Electronically signed 10/24/2020 10:45:39AM The electronic signature indicates that the named Attending Pathologist has evaluated the specimen Technical component performed at [facility], [address] , [place], [place] [address] Lab: [phone] Dir: Rush Farmer, MD, MMM  Professional component performed at [facility], [facility], [place], [place], [place] [address] Lab: [phone] Dir: Dellia Nims. Rubinas, MD    Objective  Body mass index is 42.78  kg/m. Wt Readings from Last 3 Encounters:  12/25/20 241 lb 6.4 oz (109.5 kg)  10/25/20 240 lb 12.8 oz (109.2 kg)  10/23/20 243 lb (110.2 kg)   Temp Readings from Last 3 Encounters:  12/25/20 98.1 F (36.7 C) (Oral)  10/25/20 98.2 F (36.8 C)  10/23/20 (!) 97 F (36.1 C) (Temporal)   BP Readings from Last 3 Encounters:  12/25/20 122/78  10/25/20 126/82  10/23/20 126/81   Pulse Readings from Last 3 Encounters:  12/25/20 70  10/25/20 84  10/23/20 78    Physical Exam Vitals and nursing note reviewed.  Constitutional:      Appearance: Normal appearance. She is well-developed and well-groomed. She is morbidly obese.  HENT:     Head: Normocephalic and atraumatic.  Eyes:     Conjunctiva/sclera: Conjunctivae normal.     Pupils: Pupils are equal, round, and reactive to light.  Cardiovascular:     Rate and Rhythm: Normal rate and regular rhythm.     Heart sounds: Normal heart sounds. No murmur heard.   Pulmonary:     Effort: Pulmonary effort is normal.     Breath sounds: Normal breath sounds.  Musculoskeletal:     Right knee: Effusion and bony tenderness present. Decreased range of motion. Tenderness present.  Skin:    General: Skin is warm and dry.  Neurological:     General: No focal deficit present.     Mental Status: She is alert and oriented to person, place, and time. Mental status is at baseline.     Gait: Gait normal.  Psychiatric:        Attention and Perception: Attention and perception normal.  Mood and Affect: Mood and affect normal.        Speech: Speech normal.        Behavior: Behavior normal. Behavior is cooperative.        Thought Content: Thought content normal.        Cognition and Memory: Cognition and memory normal.        Judgment: Judgment normal.     Assessment  Plan  Right leg swelling - Plan: US Venous Img Lower Unilateral Right Baker's cyst of knee, right - Plan: US Venous Img Lower Unilateral Right Primary osteoarthritis of  right knee Effusion of right knee  -f/u [facility] cc'ed Dr. Posey Pronto   HM Declines flu shotallergic Tdap utd due 7/13/2020will do in future at f/u [place] 2/2 consider boosterask allergy about this Allergic to pna vaccine Consider shingrix future Consider shingrix but given allergies use caution Not immuneMMR, hep B titer in futureuse caution though with h/o allergies with all vaccines 915/21 house calls visit A1c 6.1  h/o supracervical hysterectomy ([facility]), pap 02/29/16 negative no comment HPV, another pap 06/23/17 neg pap neg HPV  -h/o LEEP 2006  mammo 08/04/19 negativeordered11/17/21 negativeordered 2022   Colonoscopy7/10/20 normal f/u in 10 years egd sch 10/23/20 Dr. Vicente Males neg bxs 10/23/20 aperistalsis and food +consider gastric emptying study and esophageal mamometry  10/25/20 Dr. Bailey Mech Dr Jacklynn Lewis,   I will see her next Wednesday 12/01/20 at 8:15 am- march I agree is way too long , I will order the tests at her office visit after I discuss about it with her .Thank you for reaching out to me.   Yes there has been some case reports of Victoza causing GI tract dysmotility - is holding the medication for a while an option?    Echo Dr.Kowalski  -nl LV function EF 60% moderate mitral insuff and mild tricuspid insufficiency treadmill stress test neg 07/30/13 -h/o PVCS  Hep C neg 08/17/20 HIV neg 02/04/16  Ob/gyn appt pending bladder leakage [facility](stopped PT w/o help worsen sneezing or coughing) -Dr. Georgianne Fick pt wants surgeryas of 04/20/20 sx's better and no surgery indicated  Consider referral repeat sleep study h/o OSA  Provider: Dr. Olivia Mackie McLean-Scocuzza-Internal Medicine

## 2020-12-25 NOTE — Progress Notes (Signed)
Heather Sweeney , MD [address]  [place]  [place], [place] [address]  Main: [phone]  Fax: [phone]   Primary Care Physician: McLean-Scocuzza, Nino Glow, MD  Virtual Visit via Video Note  I connected with patient on 12/25/20 at  2:15 PM EDT by video and verified that I am speaking with the correct person using two identifiers.   I discussed the limitations, risks, security and privacy concerns of performing an evaluation and management service by video  and the availability of in person appointments. I also discussed with the patient that there may be a patient responsible charge related to this service. The patient expressed understanding and agreed to proceed.  Location of Patient: Home Location of Provider: Home Persons involved: Patient and provider only   History of Present Illness:   Dysphagia follow-up   HPI: Heather Sweeney is a 55 y.o. female   Summary of history :   She was last seen on 10/31/2020 for dysphagia which has been going on for many months if not longer. Similar issue many years back and she had a endoscopy and dilation of her esophagus. She does have a history of heartburn which she uses Tums as needed. She uses snuff which she puts underneath her lower lip for many years. Her father had a history of esophageal cancer. He was a non-smoker. The patient denies any weight loss. Liquids are not affected. Feels like a knot in the center of her chest.  04/15/2019: colonoscopy :normal 10/26/2020: EGD: The stomach and duodenum appeared normal.  There was significantly impaired motility of the esophagus.  There was residual food debris in the lumen.  Biopsies esophagus showed no evidence of eosinophils Still has difficulty with swallowing solids and liquids.  She is on oxycodone for musculoskeletal pain  Interval history   10/31/2020-12/25/2020  Since stopping the Victoza she says that her dysphagia has resolved.  Still taking oxycodone.  No  other complaints.    Current Outpatient Medications  Medication Sig Dispense Refill  . amLODipine (NORVASC) 5 MG tablet Take 1 tablet (5 mg total) by mouth daily. 90 tablet 3  . benztropine (COGENTIN) 1 MG tablet Take 1 mg by mouth daily.    . Biotin w/ Vitamins C & E (HAIR/SKIN/NAILS PO) Take 2,500 mcg by mouth daily.    . Blood Glucose Calibration (TRUE METRIX LEVEL 1) Low SOLN     . cetirizine (ZYRTEC ALLERGY) 10 MG tablet Take 2 tablets (20 mg total) by mouth daily. 30 tablet 0  . clobetasol cream (TEMOVATE) 5.78 % Apply 1 application topically 2 (two) times daily.    Marland Kitchen EPINEPHrine 0.3 mg/0.3 mL IJ SOAJ injection Inject 0.3 mg into the muscle as needed for anaphylaxis. 2 each 3  . famotidine (PEPCID) 40 MG tablet Take 1 tablet by mouth once daily 90 tablet 1  . fexofenadine (ALLEGRA) 180 MG tablet Take 2 tablets by mouth every morning.    . fexofenadine-pseudoephedrine (ALLEGRA-D) 60-120 MG 12 hr tablet Take 1 tablet by mouth 2 (two) times daily.    Marland Kitchen FIBER ADULT GUMMIES PO Take by mouth.    Marland Kitchen FLUoxetine (PROZAC) 10 MG capsule Take 10 mg by mouth daily.    . fluPHENAZine (PROLIXIN) 2.5 MG tablet Take 1 tablet (2.5 mg total) by mouth at bedtime.    . fluticasone (FLONASE) 50 MCG/ACT nasal spray Place 2 sprays into both nostrils daily. 48 g 3  . hydrocortisone 2.5 % lotion Apply topically 2 (two) times daily.    Marland Kitchen  hydrOXYzine (ATARAX/VISTARIL) 25 MG tablet Take 1 tablet (25 mg total) by mouth every 8 (eight) hours as needed. 90 tablet 3  . Insulin Pen Needle (PEN NEEDLES) 30G X 8 MM MISC 1 Device by Does not apply route daily. 90 each 3  . liraglutide (VICTOZA) 18 MG/3ML SOPN Inject 1.8 mg into the skin daily. 15 mL 1  . Loperamide HCl (IMODIUM A-D PO) Take by mouth.    . Melatonin 10 MG TABS Take by mouth at bedtime.    . metoprolol succinate (TOPROL-XL) 25 MG 24 hr tablet Take 1 tablet (25 mg total) by mouth daily. Take with or immediately following a meal. 90 tablet 3  . montelukast  (SINGULAIR) 10 MG tablet Take 1 tablet (10 mg total) by mouth daily. 90 tablet 1  . Multiple Vitamins-Minerals (ALIVE MULTI-VITAMIN PO) Take by mouth daily in the afternoon.    . mupirocin ointment (BACTROBAN) 2 % Apply 1 application topically 3 (three) times daily. Scalp 22 g 0  . NON FORMULARY Zzzquil melatonin/ashwaghana 3 pills at night    . omalizumab Arvid Right) 150 MG/ML prefilled syringe Inject 300 mg into the skin every 30 (thirty) days.    Marland Kitchen oxyCODONE-acetaminophen (PERCOCET) 10-325 MG tablet Take 1 tablet by mouth 3 (three) times daily as needed.    . Probiotic Product (PROBIOTIC DAILY PO) Take by mouth. probiogen probiotic daily    . topiramate (TOPAMAX) 100 MG tablet Take 100 mg by mouth daily.    Marland Kitchen triamcinolone cream (KENALOG) 0.1 % Apply 1 application topically 2 (two) times daily.    Marland Kitchen triamcinolone cream (KENALOG) 0.5 % Apply 1 application topically 3 (three) times daily.     No current facility-administered medications for this visit.    Allergies as of 12/25/2020 - Review Complete 12/25/2020  Allergen Reaction Noted  . Cephalexin Swelling and Rash 11/01/2015  . Hctz [hydrochlorothiazide] Rash and Swelling 11/01/2015  . Lisinopril Hives and Other (See Comments) 11/01/2015  . Losartan Hives and Swelling 07/08/2017  . Penicillins Rash 11/01/2015  . Bee venom  12/27/2018  . Metformin and related  02/14/2019    Review of Systems:    All systems reviewed and negative except where noted in HPI.  General Appearance:    Alert, cooperative, no distress, appears stated age  Head:    Normocephalic, without obvious abnormality, atraumatic  Eyes:    PERRL, conjunctiva/corneas clear,  Ears:    Grossly normal hearing    Neurologic:  Grossly normal    Observations/Objective:  Labs: CMP     Component Value Date/Time   NA 141 08/17/2020 1304   NA 138 09/17/2014 0549   K 3.6 08/17/2020 1304   K 4.0 09/17/2014 0549   CL 107 08/17/2020 1304   CL 103 09/17/2014 0549   CO2 27  08/17/2020 1304   CO2 28 09/17/2014 0549   GLUCOSE 109 (H) 08/17/2020 1304   GLUCOSE 105 (H) 09/17/2014 0549   BUN 10 08/17/2020 1304   BUN 9 09/17/2014 0549   CREATININE 0.96 08/17/2020 1304   CREATININE 1.01 05/19/2019 1511   CALCIUM 9.0 08/17/2020 1304   CALCIUM 9.2 09/17/2014 0549   PROT 6.1 08/17/2020 1304   PROT 7.4 04/04/2013 1451   ALBUMIN 3.8 08/17/2020 1304   ALBUMIN 3.4 04/04/2013 1451   AST 12 08/17/2020 1304   AST 26 04/04/2013 1451   ALT 19 08/17/2020 1304   ALT 34 04/04/2013 1451   ALKPHOS 50 08/17/2020 1304   ALKPHOS 88 04/04/2013 1451  BILITOT 0.4 08/17/2020 1304   BILITOT 0.5 04/04/2013 1451   GFRNONAA >60 01/06/2018 1050   GFRNONAA >60 09/17/2014 0549   GFRNONAA >60 04/04/2013 1451   GFRAA >60 01/06/2018 1050   GFRAA >60 09/17/2014 0549   GFRAA >60 04/04/2013 1451   Lab Results  Component Value Date   WBC 6.2 05/21/2020   HGB 12.1 05/21/2020   HCT 34.8 (L) 05/21/2020   MCV 84.1 05/21/2020   PLT 184.0 05/21/2020    Imaging Studies: US Venous Img Lower Unilateral Right  Result Date: 12/25/2020 CLINICAL DATA:  Right leg swelling and pain.  Question Baker's cyst. EXAM: Right LOWER EXTREMITY VENOUS DOPPLER ULTRASOUND TECHNIQUE: Gray-scale sonography with compression, as well as color and duplex ultrasound, were performed to evaluate the deep venous system(s) from the level of the common femoral vein through the popliteal and proximal calf veins. COMPARISON:  None. FINDINGS: VENOUS Normal compressibility of the common femoral, superficial femoral, and popliteal veins, as well as the visualized calf veins. Visualized portions of profunda femoral vein and great saphenous vein unremarkable. No filling defects to suggest DVT on grayscale or color Doppler imaging. Doppler waveforms show normal direction of venous flow, normal respiratory plasticity and response to augmentation. Limited views of the contralateral common femoral vein are unremarkable. OTHER Negative  for Baker's cyst. Limitations: none IMPRESSION: Negative. Electronically Signed   By: Franchot Gallo M.D.   On: 12/25/2020 09:58    Assessment and Plan:   Heather Sweeney is a 55 y.o. y/o female here to follow-up for dysphagia. History of acid reflux. Uses snuff daily which she places under her lower lip.  EGD showed features of esophageal dysmotility in the body of the esophagus.  No visible stricture or hypertonic lower esophageal sphincter.  I discussed with her that the etiology for esophageal dysmotility could be secondary to oxycodone use, review of the literature has also suggested that there are a few case reports of Victoza causing GI tract dysmotility.  She has improved significantly after stopping the Victoza.  Plan 1.   Continue Pepcid daily.  No symptoms at this point of time.  Refill provided.  Follow-up in 6 months.   I discussed the assessment and treatment plan with the patient. The patient was provided an opportunity to ask questions and all were answered. The patient agreed with the plan and demonstrated an understanding of the instructions.   The patient was advised to call back or seek an in-person evaluation if the symptoms worsen or if the condition fails to improve as anticipated.  I provided 15 minutes of face-to-face time during this encounter.  Dr Heather Bellows MD,MRCP [facility]) Gastroenterology/Hepatology Pager: [phone]   Speech recognition software was used to dictate this note.

## 2020-12-26 ENCOUNTER — Telehealth: Payer: Self-pay

## 2020-12-26 ENCOUNTER — Telehealth: Payer: Self-pay | Admitting: Pharmacist

## 2020-12-26 ENCOUNTER — Ambulatory Visit: Payer: Medicare Other | Admitting: Gastroenterology

## 2020-12-26 ENCOUNTER — Ambulatory Visit: Payer: Medicare Other

## 2020-12-26 NOTE — Telephone Encounter (Signed)
Faxed notes from MRI rt knee to Leim Fabry, MD at [phone] on 12/25/20

## 2020-12-26 NOTE — Telephone Encounter (Signed)
  Chronic Care Management   Note  12/26/2020 Name: [name] MRN: [id number] DOB: 28-Jan-1966   Patient called the office noting that she would not be able to be at our appointment until 9:15-9:20. Given rest of schedule today, I noted that we would need to reschedule appointment.   Will collaborate w/ Care Guide to outreach patient to reschedule.   Catie [name], PharmD, [place], [place] Clinical Pharmacist [employer] [facility] [place]

## 2020-12-31 NOTE — Addendum Note (Signed)
Addended by: Orland Mustard on: 12/31/2020 06:34 AM   Modules accepted: Orders

## 2021-01-01 ENCOUNTER — Telehealth: Payer: Self-pay

## 2021-01-01 ENCOUNTER — Institutional Professional Consult (permissible substitution): Admit: 2021-01-01 | Discharge: 2021-01-02 | Payer: MEDICARE

## 2021-01-01 DIAGNOSIS — L509 Urticaria, unspecified: Principal | ICD-10-CM

## 2021-01-01 NOTE — Telephone Encounter (Signed)
Patient called office again and wanted to speak to Dr. Aundra Dubin. See note below.

## 2021-01-01 NOTE — Telephone Encounter (Signed)
Pt called and states that it is very important that she talks with you today but refused to disclose any other information at all. Please advise

## 2021-01-01 NOTE — Telephone Encounter (Signed)
Called to speak with patient. She states that her leg is hurting her and who she was sent to by referral could not see her until 01/21/21. She states that this will be too long.   Patient states she has found somewhere that will see her tomorrow at 10:00 am and she needs a referral. Was given the information for [facility] in [place].   Gave this to Dr Olivia Mackie McLean-Scocuzza. Dr Olivia Mackie McLean-Scocuzza does not suggest going here. States this is not a regulated specialty office and that there are no doctor's there. There is no referral for going here.   Called the Patient to inform her of this. Informed the Patient that she should keep the appointment on 4/18 and ask to be placed on the cancellation list. Patient verbalized understanding and will call Ortho.

## 2021-01-02 NOTE — Telephone Encounter (Signed)
Pt has been rescheduled. 

## 2021-01-04 ENCOUNTER — Emergency Department
Admission: EM | Admit: 2021-01-04 | Discharge: 2021-01-04 | Disposition: A | Discharge status: Home / Self Care | Payer: Medicare Other | Attending: Student in an Organized Health Care Education/Training Program | Admitting: Student in an Organized Health Care Education/Training Program

## 2021-01-04 ENCOUNTER — Encounter: Payer: Self-pay | Admitting: Emergency Medicine

## 2021-01-04 ENCOUNTER — Other Ambulatory Visit: Payer: Self-pay

## 2021-01-04 ENCOUNTER — Emergency Department: Payer: Medicare Other

## 2021-01-04 DIAGNOSIS — I10 Essential (primary) hypertension: Secondary | ICD-10-CM | POA: Insufficient documentation

## 2021-01-04 DIAGNOSIS — Z794 Long term (current) use of insulin: Secondary | ICD-10-CM | POA: Diagnosis not present

## 2021-01-04 DIAGNOSIS — M25561 Pain in right knee: Secondary | ICD-10-CM | POA: Diagnosis not present

## 2021-01-04 DIAGNOSIS — M1711 Unilateral primary osteoarthritis, right knee: Secondary | ICD-10-CM | POA: Diagnosis not present

## 2021-01-04 DIAGNOSIS — G8929 Other chronic pain: Secondary | ICD-10-CM | POA: Insufficient documentation

## 2021-01-04 DIAGNOSIS — R7303 Prediabetes: Secondary | ICD-10-CM | POA: Diagnosis not present

## 2021-01-04 DIAGNOSIS — Z79899 Other long term (current) drug therapy: Secondary | ICD-10-CM | POA: Insufficient documentation

## 2021-01-04 NOTE — ED Triage Notes (Signed)
Pt comes into the ED via POV c/o right knee pain with no injury.  Pt states she has known h/o arthritis in the knee.  Pt in NAD with even and unlabored respirations.

## 2021-01-04 NOTE — Discharge Instructions (Addendum)
Your exam and x-ray are negative for any acute findings.  You may experience a sprain to the knee.  Wear the Ace bandage as needed for support.  Use a walker to support your ambulation, as needed.  Follow-up with Ortho provider primary provider as scheduled.  Return to the ED if needed.

## 2021-01-04 NOTE — ED Notes (Signed)
Pt taken from triage, straight back to available exam room.

## 2021-01-04 NOTE — ED Notes (Signed)
See triage note  Presents with right knee pain  States she has been having problems with her knee   But felt a pop when she was taking out the trash  States and some swelling to posterior right knee  Unable to bear wt

## 2021-01-04 NOTE — ED Provider Notes (Signed)
[facility] Emergency Department Provider Note ____________________________________________  Time seen: 1710  I have reviewed the triage vital signs and the nursing notes.  HISTORY  Chief Complaint  Knee Pain   HPI Heather Sweeney is a 55 y.o. female with the below medical history, presents to the ED with complaints of right knee pain with no recent injury.  She reports she went to transition from sit to stand, and felt a pop to the posterior part of the knee.  Since that time, she reports pain with attempts to bear weight to the leg.  She denies any outright fall from the incident.  Patient with a history of arthritis to the knee, presents for evaluation of her symptoms.  Of note, is that the patient is currently under the care of [facility] for ongoing management of her chronic knee pain.  According to the chart review, the patient had a cortisone injection to the right knee performed about 4 weeks ago.   Patient's most recent MRI of the right knee performed in May 2021, reveals: 1. Intact ligamentous structures and no acute bony findings. Mild mucoid degeneration of the ACL. 2. No meniscal tears. 3. Moderate age advanced tricompartmental degenerative changes. 4. Small to moderate-sized joint effusion and very small Baker's cyst. According to chart review, the patient has reported ongoing chronic knee pain without benefit from her most recent cortisone injection.  She is also had no benefit from the use of a hinged knee brace and topical anti-inflammatories.  Patient is also on opioid therapy for her DJD.  Recent venous ultrasound revealed no evidence of DVT.  Past Medical History:  Diagnosis Date  . Allergic rhinitis   . Bipolar disorder ([place])   . Chronic low back pain   . CTS (carpal tunnel syndrome)    hands  . Depression   . Gallstones   . GERD (gastroesophageal reflux disease)   . History of prediabetes   . Hypertension   . Laceration of  right foot   . Onychomycosis   . OSA on CPAP   . Overactive bladder   . PVC's (premature ventricular contractions)   . PVC's (premature ventricular contractions)    Dr. Raliegh Ip cards  . Seizures ([place])    8th grade    Patient Active Problem List   Diagnosis Date Noted  . Arthritis of right knee 10/25/2020  . Aperistalsis, esophagus 10/25/2020  . Insomnia 10/11/2020  . Elevated liver enzymes 08/17/2020  . Prediabetes 07/24/2020  . Arthritis of both knees 06/08/2020  . Annual physical exam 04/20/2020  . Ingrown right big toenail 04/20/2020  . Gastroesophageal reflux disease 04/20/2020  . Pain and swelling of right knee 04/20/2020  . Mitral valve insufficiency 01/10/2020  . Bacterial vaginosis 10/20/2019  . Encounter for screening colonoscopy   . Hives 02/23/2019  . Vitamin D deficiency 02/01/2019  . Mood disorder ([place]) 12/24/2018  . HLD (hyperlipidemia) 09/01/2018  . History of prediabetes 09/01/2018  . Chronic pain 08/31/2018  . Allergic rhinitis 08/31/2018  . Lumbosacral radiculitis 02/19/2018  . S/P laparoscopic cholecystectomy April 2019 01/12/2018  . Angioedema 07/07/2017  . Bilateral carpal tunnel syndrome 11/03/2016  . Frequent PVCs 09/24/2016  . Morbid obesity with BMI of 40.0-44.9, adult ([place]) 08/07/2016  . Essential hypertension 07/10/2016  . OSA on CPAP 07/10/2016    Past Surgical History:  Procedure Laterality Date  . ABDOMINAL HYSTERECTOMY  07/08/2011  . ABDOMINAL HYSTERECTOMY     supracervical in 07/2011   . CARPAL  TUNNEL RELEASE     right hand 1996  . CESAREAN SECTION     2012  . CHOLECYSTECTOMY N/A 01/12/2018   Procedure: LAPAROSCOPIC CHOLECYSTECTOMY WITH INTRAOPERATIVE CHOLANGIOGRAM ERAS PATHWAY;  Surgeon: Johnathan Hausen, MD;  Location: [facility] ORS;  Service: General;  Laterality: N/A;  . COLONOSCOPY WITH PROPOFOL N/A 04/15/2019   Procedure: COLONOSCOPY WITH PROPOFOL;  Surgeon: Jonathon Bellows, MD;  Location: [facility] ENDOSCOPY;  Service: Gastroenterology;  Laterality:  N/A;  . DILATION AND CURETTAGE OF UTERUS     04/2011 benign endometrial polyp squamous metaplasia, inflamm, blood, mucous, benign   . ESOPHAGOGASTRODUODENOSCOPY (EGD) WITH PROPOFOL N/A 10/23/2020   Procedure: ESOPHAGOGASTRODUODENOSCOPY (EGD) WITH PROPOFOL;  Surgeon: Jonathon Bellows, MD;  Location: [facility] END[facility]COPY;  Service: Gastroenterology;  Laterality: N/A;  . FOOT SURGERY     Dr. Prudence Davidson   . LEEP     2006  . TUBAL LIGATION      Prior to Admission medications   Medication Sig Start Date End Date Taking? Authorizing Provider  amLODipine (NORVASC) 5 MG tablet Take 1 tablet (5 mg total) by mouth daily. 04/20/20 04/20/21  McLean-Scocuzza, Nino Glow, MD  benztropine (COGENTIN) 1 MG tablet Take 1 mg by mouth daily. 08/10/20   [provider]  Biotin w/ Vitamins C & E (HAIR/SKIN/NAILS PO) Take 2,500 mcg by mouth daily.    [provider]  Blood Glucose Calibration (TRUE METRIX LEVEL 1) Low SOLN  01/12/20   [provider]  cetirizine (ZYRTEC ALLERGY) 10 MG tablet Take 2 tablets (20 mg total) by mouth daily. 10/11/20   McLean-Scocuzza, Nino Glow, MD  clobetasol cream (TEMOVATE) 7.26 % Apply 1 application topically 2 (two) times daily.    [provider]  EPINEPHrine 0.3 mg/0.3 mL IJ SOAJ injection Inject 0.3 mg into the muscle as needed for anaphylaxis. 08/17/20   McLean-Scocuzza, Nino Glow, MD  famotidine (PEPCID) 40 MG tablet Take 1 tablet (40 mg total) by mouth daily. 12/25/20   Jonathon Bellows, MD  fexofenadine (ALLEGRA) 180 MG tablet Take 2 tablets by mouth every morning. 08/07/20   [provider]  fexofenadine-pseudoephedrine (ALLEGRA-D) 60-120 MG 12 hr tablet Take 1 tablet by mouth 2 (two) times daily.    [provider]  FIBER ADULT GUMMIES PO Take by mouth.    [provider]  FLUoxetine (PROZAC) 10 MG capsule Take 10 mg by mouth daily. 07/15/20   [provider]  fluPHENAZine (PROLIXIN) 2.5 MG tablet Take 1 tablet (2.5 mg total) by mouth at  bedtime. 08/17/20   McLean-Scocuzza, Nino Glow, MD  fluticasone (FLONASE) 50 MCG/ACT nasal spray Place 2 sprays into both nostrils daily. 10/12/20   McLean-Scocuzza, Nino Glow, MD  hydrocortisone 2.5 % lotion Apply topically 2 (two) times daily.    [provider]  hydrOXYzine (ATARAX/VISTARIL) 25 MG tablet Take 1 tablet (25 mg total) by mouth every 8 (eight) hours as needed. 07/24/20   McLean-Scocuzza, Nino Glow, MD  Insulin Pen Needle (PEN NEEDLES) 30G X 8 MM MISC 1 Device by Does not apply route daily. 06/29/20   McLean-Scocuzza, Nino Glow, MD  liraglutide (VICTOZA) 18 MG/3ML SOPN Inject 1.8 mg into the skin daily. 10/12/20   McLean-Scocuzza, Nino Glow, MD  Loperamide HCl (IMODIUM A-D PO) Take by mouth.    [provider]  Melatonin 10 MG TABS Take by mouth at bedtime.    [provider]  metoprolol succinate (TOPROL-XL) 25 MG 24 hr tablet Take 1 tablet (25 mg total) by mouth daily. Take with or  immediately following a meal. 04/20/20   McLean-Scocuzza, Nino Glow, MD  montelukast (SINGULAIR) 10 MG tablet Take 1 tablet (10 mg total) by mouth daily. 10/12/20   McLean-Scocuzza, Nino Glow, MD  Multiple Vitamins-Minerals (ALIVE MULTI-VITAMIN PO) Take by mouth daily in the afternoon.    [provider]  mupirocin ointment (BACTROBAN) 2 % Apply 1 application topically 3 (three) times daily. Scalp 08/17/20   McLean-Scocuzza, Nino Glow, MD  NON FORMULARY Zzzquil melatonin/ashwaghana 3 pills at night    [provider]  omalizumab Arvid Right) 150 MG/ML prefilled syringe Inject 300 mg into the skin every 30 (thirty) days. 11/13/20 10/08/21  [provider]  oxyCODONE-acetaminophen (PERCOCET) 10-325 MG tablet Take 1 tablet by mouth 3 (three) times daily as needed. 05/31/20   [provider]  Probiotic Product (PROBIOTIC DAILY PO) Take by mouth. probiogen probiotic daily    [provider]  topiramate (TOPAMAX) 100 MG tablet Take 100 mg by mouth daily. 03/12/20   [provider]  triamcinolone cream (KENALOG) 0.1 % Apply 1 application topically 2 (two) times daily.    [provider]  triamcinolone cream (KENALOG) 0.5 % Apply 1 application topically 3 (three) times daily.    [provider]    Allergies Cephalexin, Hctz [hydrochlorothiazide], Lisinopril, Losartan, Penicillins, Bee venom, and Metformin and related  Family History  Problem Relation Age of Onset  . Cancer Father        esophageal   . Hypertension Father   . Heart failure Mother   . Hypertension Mother   . Early death Mother   . Heart disease Mother        chf  . Breast cancer Sister 63  . Cancer Sister        breast  . Depression Sister   . Mental illness Sister   . Asthma Son   . Cancer Other        brain cancer    Social History Social History   Tobacco Use  . Smoking status: Never Smoker  . Smokeless tobacco: Current User    Types: Snuff  . Tobacco comment: Is in process of decreasing amount  Vaping Use  . Vaping Use: Never used  Substance Use Topics  . Alcohol use: No    Alcohol/week: 0.0 standard drinks  . Drug use: No    Review of Systems  Constitutional: Negative for fever. Cardiovascular: Negative for chest pain. Respiratory: Negative for shortness of breath. Gastrointestinal: Negative for abdominal pain, vomiting and diarrhea. Genitourinary: Negative for dysuria. Musculoskeletal: Negative for back pain.  Knee pain as above. Skin: Negative for rash. Neurological: Negative for headaches, focal weakness or numbness. ____________________________________________  PHYSICAL EXAM:  VITAL SIGNS: ED Triage Vitals [01/04/21 1630]  Enc Vitals Group     BP 118/67     Pulse Rate 74     Resp 16     Temp 98.3 F (36.8 C)     Temp Source Oral     SpO2 99 %     Weight 240 lb 4.8 oz (109 kg)     Height 5\' 2"  (1.575 m)     Head Circumference      Peak Flow      Pain Score 10     Pain Loc      Pain Edu?      Excl. in [place]?      Constitutional: Alert and oriented. Well appearing and in no distress. Head: Normocephalic and atraumatic. Eyes: Conjunctivae are normal. Normal  extraocular movements Cardiovascular: Normal rate, regular rhythm. Normal distal pulses. Respiratory: Normal respiratory effort.  Musculoskeletal: Right knee without obvious deformity, dislocation, or effusion.  Patient with normal active range of motion of the right knee.  She is able demonstrate quadricep integrity.  No significant valgus or varus strain stress is appreciated.  Mildly tender palpation of the popliteal space.  Nontender with normal range of motion in all extremities.  Neurologic: CN II to XII grossly intact. Normal speech and language. No gross focal neurologic deficits are appreciated. Skin:  Skin is warm, dry and intact. No rash noted. _________________________________________   RADIOLOGY  DG Right Knee  IMPRESSION: 1. Intact ligamentous structures and no acute bony findings. Mild mucoid degeneration of the ACL. 2. No meniscal tears. 3. Moderate age advanced tricompartmental degenerative changes. 4. Small to moderate-sized joint effusion and very small Baker's cyst. ___________________________________________  PROCEDURES  Jones-watson Wrap - Right knee Standard Walker ____________________________________________  INITIAL IMPRESSION / ASSESSMENT AND PLAN / ED COURSE  Patient ED evaluation of acute on chronic right knee pain after transitioning from sit to stand.  Patient will report a palpable pop to the posterior aspect of the knee.  No indication on exam of any acute internal derangement.  No radiologic evidence of any acute fracture or dislocation.  Patient likely is experiencing a mild knee sprain overlying her mild tricompartmental DJD.  She was placed in a Jones Watson wrap for comfort, and given a standard walker for ambulation.  She will follow-up with her Ortho provider as scheduled in the next 2 weeks.   Return precautions have been discussed.  Heather Sweeney was evaluated in Emergency Department on 01/04/2021 for the symptoms described in the history of present illness. She was evaluated in the context of the global COVID-19 pandemic, which necessitated consideration that the patient might be at risk for infection with the SARS-CoV-2 virus that causes COVID-19. Institutional protocols and algorithms that pertain to the evaluation of patients at risk for COVID-19 are in a state of rapid change based on information released by regulatory bodies including the CDC and federal and state organizations. These policies and algorithms were followed during the patient's care in the ED. ____________________________________________  FINAL CLINICAL IMPRESSION(S) / ED DIAGNOSES  Final diagnoses:  Chronic pain of right knee  Tricompartment osteoarthritis of right knee      Aleicia Kenagy, Dannielle Karvonen, PA-C 01/04/21 1808    Merlyn Lot, MD 01/04/21 2139

## 2021-01-14 DIAGNOSIS — Z79899 Other long term (current) drug therapy: Secondary | ICD-10-CM | POA: Diagnosis not present

## 2021-01-15 ENCOUNTER — Telehealth: Payer: Self-pay

## 2021-01-15 NOTE — Telephone Encounter (Signed)
Pt would like to reschedule her appt with you that is scheduled for 01/25/21.

## 2021-01-16 ENCOUNTER — Telehealth: Payer: Self-pay

## 2021-01-16 NOTE — Chronic Care Management (AMB) (Signed)
  Care Management   Note  01/16/2021 Name: [name] MRN: [id number] DOB: 27-Apr-1966  Heather Sweeney is a 55 y.o. year old female who is a primary care patient of McLean-Scocuzza, Nino Glow, MD and is actively engaged with the care management team. I reached out to Heather Sweeney by phone today to assist with re-scheduling an initial visit with the Pharmacist  Follow up plan: Face to Face appointment with care management team member scheduled for: 02/11/2021  Noreene Larsson, [place], [place], [place] [address] Direct Dial: [phone] [email or web address]: [email or web address]

## 2021-01-16 NOTE — Chronic Care Management (AMB) (Signed)
  Care Management   Note  01/16/2021 Name: Heather Sweeney MRN: [id number] DOB: Oct 31, 1965  Heather Sweeney is a 55 y.o. year old female who is a primary care patient of McLean-Scocuzza, Nino Glow, MD and is actively engaged with the care management team. I reached out to Heather Sweeney by phone today to assist with re-scheduling a follow up visit with the Pharmacist  Follow up plan: Unsuccessful telephone outreach attempt made. A HIPAA compliant phone message was left for the patient providing contact information and requesting a return call.  The care management team will reach out to the patient again over the next 5 days.  If patient returns call to provider office, please advise to call [facility]  at [place], [address], [place], [place] [address] Direct Dial: [phone] [email or web address]: [email or web address]

## 2021-01-18 ENCOUNTER — Ambulatory Visit: Admit: 2021-01-18 | Discharge: 2021-01-19 | Payer: MEDICARE

## 2021-01-18 DIAGNOSIS — T783XXD Angioneurotic edema, subsequent encounter: Principal | ICD-10-CM

## 2021-01-18 DIAGNOSIS — L508 Other urticaria: Principal | ICD-10-CM

## 2021-01-18 DIAGNOSIS — K1379 Other lesions of oral mucosa: Principal | ICD-10-CM

## 2021-01-18 MED ORDER — OMALIZUMAB 150 MG/ML SUBCUTANEOUS SYRINGE
SUBCUTANEOUS | 12 refills | 21.00000 days | Status: CP
Start: 2021-01-18 — End: ?

## 2021-01-18 MED ORDER — PREDNISONE 10 MG TABLET
ORAL_TABLET | ORAL | 0 refills | 25 days | Status: CP
Start: 2021-01-18 — End: 2021-02-12

## 2021-01-21 DIAGNOSIS — M25569 Pain in unspecified knee: Secondary | ICD-10-CM | POA: Diagnosis not present

## 2021-01-21 DIAGNOSIS — M5451 Vertebrogenic low back pain: Secondary | ICD-10-CM | POA: Diagnosis not present

## 2021-01-21 DIAGNOSIS — Z5181 Encounter for therapeutic drug level monitoring: Secondary | ICD-10-CM | POA: Diagnosis not present

## 2021-01-21 DIAGNOSIS — M797 Fibromyalgia: Secondary | ICD-10-CM | POA: Diagnosis not present

## 2021-01-21 DIAGNOSIS — Z79891 Long term (current) use of opiate analgesic: Secondary | ICD-10-CM | POA: Diagnosis not present

## 2021-01-21 DIAGNOSIS — M47816 Spondylosis without myelopathy or radiculopathy, lumbar region: Secondary | ICD-10-CM | POA: Diagnosis not present

## 2021-01-21 DIAGNOSIS — G894 Chronic pain syndrome: Secondary | ICD-10-CM | POA: Diagnosis not present

## 2021-01-23 ENCOUNTER — Other Ambulatory Visit: Payer: Self-pay

## 2021-01-23 ENCOUNTER — Ambulatory Visit (INDEPENDENT_AMBULATORY_CARE_PROVIDER_SITE_OTHER): Payer: Medicare Other | Admitting: Obstetrics and Gynecology

## 2021-01-23 ENCOUNTER — Encounter: Payer: Self-pay | Admitting: Obstetrics and Gynecology

## 2021-01-23 VITALS — BP 126/80 | Ht 63.0 in | Wt 247.0 lb

## 2021-01-23 DIAGNOSIS — R159 Full incontinence of feces: Secondary | ICD-10-CM

## 2021-01-23 DIAGNOSIS — B3789 Other sites of candidiasis: Secondary | ICD-10-CM

## 2021-01-23 DIAGNOSIS — N898 Other specified noninflammatory disorders of vagina: Secondary | ICD-10-CM | POA: Diagnosis not present

## 2021-01-23 DIAGNOSIS — M1711 Unilateral primary osteoarthritis, right knee: Secondary | ICD-10-CM | POA: Diagnosis not present

## 2021-01-23 LAB — POCT WET PREP WITH KOH
Clue Cells Wet Prep HPF POC: NEGATIVE
KOH Prep POC: NEGATIVE
Trichomonas, UA: NEGATIVE
Yeast Wet Prep HPF POC: NEGATIVE

## 2021-01-23 MED ORDER — FLUCONAZOLE 150 MG PO TABS: 150.0000 mg | ORAL_TABLET | Freq: Once | ORAL | 0 refills | Status: AC

## 2021-01-23 MED ORDER — CLOTRIMAZOLE-BETAMETHASONE 1-0.05 % EX CREA: TOPICAL_CREAM | CUTANEOUS | 0 refills | Status: DC

## 2021-01-23 NOTE — Patient Instructions (Signed)
I value your feedback and you entrusting us with your care. If you get a [facility] patient survey, I would appreciate you taking the time to let us know about your experience today. Thank you! ? ? ?

## 2021-01-23 NOTE — Progress Notes (Signed)
McLean-Scocuzza, Nino Glow, MD   Chief Complaint  Patient presents with  . Vaginal Discharge    Abnormal odor, itchiness, having rectal discharge as well x 2 weeks    HPI:      Ms. Heather Sweeney is a 55 y.o. [id number] whose LMP was No LMP recorded. Patient has had a hysterectomy., presents today for increased vag d/c with terrible odor, also with itching rectally and bilat inguinal areas vaginally. Has noticed brown d/c with wiping and in underwear, but is s/p supracx hyst (neg pap 2/21) and denies vaginal bleeding. Has treated with vagisil itch cream and Lume without relief. No urin sx, no pelvic pain, fevers, LBP. She sometimes has loose stools. No hx of fecal incontinence. She is not sex active, no change in soaps/detergents. Uses dryer sheets in clothes.  No recent abx use. On DM meds. Had colonoscopy 2020.   Past Medical History:  Diagnosis Date  . Allergic rhinitis   . Bipolar disorder ([place])   . Chronic low back pain   . CTS (carpal tunnel syndrome)    hands  . Depression   . Gallstones   . GERD (gastroesophageal reflux disease)   . History of prediabetes   . Hypertension   . Laceration of right foot   . Onychomycosis   . OSA on CPAP   . Overactive bladder   . PVC's (premature ventricular contractions)   . PVC's (premature ventricular contractions)    Dr. Raliegh Ip cards  . Seizures ([place])    8th grade    Past Surgical History:  Procedure Laterality Date  . ABDOMINAL HYSTERECTOMY  07/08/2011  . ABDOMINAL HYSTERECTOMY     supracervical in 07/2011   . CARPAL TUNNEL RELEASE     right hand 1996  . CESAREAN SECTION     2012  . CHOLECYSTECTOMY N/A 01/12/2018   Procedure: LAPAROSCOPIC CHOLECYSTECTOMY WITH INTRAOPERATIVE CHOLANGIOGRAM ERAS PATHWAY;  Surgeon: Johnathan Hausen, MD;  Location: [facility];  Service: General;  Laterality: N/A;  . COLONOSCOPY WITH PROPOFOL N/A 04/15/2019   Procedure: COLONOSCOPY WITH PROPOFOL;  Surgeon: Jonathon Bellows, MD;  Location: [facility] ENDOSCOPY;   Service: Gastroenterology;  Laterality: N/A;  . DILATION AND CURETTAGE OF UTERUS     04/2011 benign endometrial polyp squamous metaplasia, inflamm, blood, mucous, benign   . ESOPHAGOGASTRODUODENOSCOPY (EGD) WITH PROPOFOL N/A 10/23/2020   Procedure: ESOPHAGOGASTRODUODENOSCOPY (EGD) WITH PROPOFOL;  Surgeon: Jonathon Bellows, MD;  Location: [facility] ENDOSCOPY;  Service: Gastroenterology;  Laterality: N/A;  . FOOT SURGERY     Dr. Prudence Davidson   . LEEP     2006  . TUBAL LIGATION      Family History  Problem Relation Age of Onset  . Cancer Father        esophageal   . Hypertension Father   . Heart failure Mother   . Hypertension Mother   . Early death Mother   . Heart disease Mother        chf  . Breast cancer Sister 57  . Cancer Sister        breast  . Depression Sister   . Mental illness Sister   . Asthma Son   . Cancer Other        brain cancer    Social History   Socioeconomic History  . Marital status: Married    Spouse name: Not on file  . Number of children: Not on file  . Years of education: Not on file  . Highest education level: Not on  file  Occupational History  . Not on file  Tobacco Use  . Smoking status: Never Smoker  . Smokeless tobacco: Current User    Types: Snuff  . Tobacco comment: Is in process of decreasing amount  Vaping Use  . Vaping Use: Never used  Substance and Sexual Activity  . Alcohol use: No    Alcohol/week: 0.0 standard drinks  . Drug use: No  . Sexual activity: Not Currently    Birth control/protection: Surgical    Comment: Hysterectomy  Other Topics Concern  . Not on file  Social History Narrative   Married    No guns    Wears seat belt    Safe in relationship    Never smoker    2 sons Gaspar Bidding ~10 and another son ~28 as of 10/2019   1 granson eli 1 y.o as of 10/2019   2 sisters    Unemployed       Social Determinants of Health   Financial Resource Strain: Low Risk   . Difficulty of Paying Living Expenses: Not hard at all  Food Insecurity:  No Food Insecurity  . Worried About Charity fundraiser in the Last Year: Never true  . Ran Out of Food in the Last Year: Never true  Transportation Needs: No Transportation Needs  . Lack of Transportation (Medical): No  . Lack of Transportation (Non-Medical): No  Physical Activity: Not on file  Stress: No Stress Concern Present  . Feeling of Stress : Not at all  Social Connections: Unknown  . Frequency of Communication with Friends and Family: Not on file  . Frequency of Social Gatherings with Friends and Family: Not on file  . Attends Religious Services: Not on file  . Active Member of Clubs or Organizations: Not on file  . Attends Archivist Meetings: Not on file  . Marital Status: Married  Human resources officer Violence: Not At Risk  . Fear of Current or Ex-Partner: No  . Emotionally Abused: No  . Physically Abused: No  . Sexually Abused: No    Outpatient Medications Prior to Visit  Medication Sig Dispense Refill  . amLODipine (NORVASC) 5 MG tablet Take 1 tablet (5 mg total) by mouth daily. 90 tablet 3  . benztropine (COGENTIN) 1 MG tablet Take 1 mg by mouth daily.    . Biotin w/ Vitamins C & E (HAIR/SKIN/NAILS PO) Take 2,500 mcg by mouth daily.    . Blood Glucose Calibration (TRUE METRIX LEVEL 1) Low SOLN     . cetirizine (ZYRTEC ALLERGY) 10 MG tablet Take 2 tablets (20 mg total) by mouth daily. 30 tablet 0  . clobetasol cream (TEMOVATE) 1.61 % Apply 1 application topically 2 (two) times daily.    Marland Kitchen EPINEPHrine 0.3 mg/0.3 mL IJ SOAJ injection Inject 0.3 mg into the muscle as needed for anaphylaxis. 2 each 3  . famotidine (PEPCID) 40 MG tablet Take 1 tablet (40 mg total) by mouth daily. 90 tablet 1  . fexofenadine (ALLEGRA) 180 MG tablet Take 2 tablets by mouth every morning.    . fexofenadine-pseudoephedrine (ALLEGRA-D) 60-120 MG 12 hr tablet Take 1 tablet by mouth 2 (two) times daily.    Marland Kitchen FIBER ADULT GUMMIES PO Take by mouth.    Marland Kitchen FLUoxetine (PROZAC) 10 MG capsule Take  10 mg by mouth daily.    . fluPHENAZine (PROLIXIN) 2.5 MG tablet Take 1 tablet (2.5 mg total) by mouth at bedtime.    . fluticasone (FLONASE) 50 MCG/ACT nasal spray  Place 2 sprays into both nostrils daily. 48 g 3  . hydrocortisone 2.5 % lotion Apply topically 2 (two) times daily.    . hydrOXYzine (ATARAX/VISTARIL) 25 MG tablet Take 1 tablet (25 mg total) by mouth every 8 (eight) hours as needed. 90 tablet 3  . Insulin Pen Needle (PEN NEEDLES) 30G X 8 MM MISC 1 Device by Does not apply route daily. 90 each 3  . liraglutide (VICTOZA) 18 MG/3ML SOPN Inject 1.8 mg into the skin daily. 15 mL 1  . Loperamide HCl (IMODIUM A-D PO) Take by mouth.    . Melatonin 10 MG TABS Take by mouth at bedtime.    . metoprolol succinate (TOPROL-XL) 25 MG 24 hr tablet Take 1 tablet (25 mg total) by mouth daily. Take with or immediately following a meal. 90 tablet 3  . montelukast (SINGULAIR) 10 MG tablet Take 1 tablet (10 mg total) by mouth daily. 90 tablet 1  . Multiple Vitamins-Minerals (ALIVE MULTI-VITAMIN PO) Take by mouth daily in the afternoon.    . mupirocin ointment (BACTROBAN) 2 % Apply 1 application topically 3 (three) times daily. Scalp 22 g 0  . NON FORMULARY Zzzquil melatonin/ashwaghana 3 pills at night    . omalizumab Arvid Right) 150 MG/ML prefilled syringe Inject 300 mg into the skin every 30 (thirty) days.    Marland Kitchen oxyCODONE-acetaminophen (PERCOCET) 10-325 MG tablet Take 1 tablet by mouth 3 (three) times daily as needed.    . Probiotic Product (PROBIOTIC DAILY PO) Take by mouth. probiogen probiotic daily    . topiramate (TOPAMAX) 100 MG tablet Take 100 mg by mouth daily.    Marland Kitchen triamcinolone cream (KENALOG) 0.1 % Apply 1 application topically 2 (two) times daily.    Marland Kitchen triamcinolone cream (KENALOG) 0.5 % Apply 1 application topically 3 (three) times daily.     No facility-administered medications prior to visit.      ROS:  Review of Systems  Constitutional: Negative for fever.  Gastrointestinal: Positive  for diarrhea. Negative for blood in stool, constipation, nausea and vomiting.  Genitourinary: Positive for vaginal discharge. Negative for dyspareunia, dysuria, flank pain, frequency, hematuria, urgency, vaginal bleeding and vaginal pain.  Musculoskeletal: Negative for back pain.  Skin: Negative for rash.    OBJECTIVE:   Vitals:  BP 126/80   Ht 5\' 3"  (1.6 m)   Wt 247 lb (112 kg)   BMI 43.75 kg/m   Physical Exam Vitals reviewed.  Constitutional:      Appearance: She is well-developed.  Pulmonary:     Effort: Pulmonary effort is normal.  Genitourinary:    General: Normal vulva.     Pubic Area: No rash.      Labia:        Right: No rash, tenderness or lesion.        Left: No rash, tenderness or lesion.      Vagina: Normal. No vaginal discharge, erythema, tenderness or bleeding.     Uterus: Absent. Not enlarged and not tender.      Adnexa: Right adnexa normal and left adnexa normal.       Right: No mass or tenderness.         Left: No mass or tenderness.       Rectum: No mass.       Comments: NORMAL VAG EXAM, NO BROWN D/C; DOES HAVE SMALL AMT STOOL LEAKAGE AT ANUS; PERIANAL AREA TOWARDS BUTTOCKS WITH ERYTHEMA, SKIN BREAKDOWN; AREA IS WET  Musculoskeletal:        General: Normal  range of motion.     Cervical back: Normal range of motion.  Skin:    General: Skin is warm and dry.  Neurological:     General: No focal deficit present.     Mental Status: She is alert and oriented to person, place, and time.  Psychiatric:        Mood and Affect: Mood normal.        Behavior: Behavior normal.        Thought Content: Thought content normal.        Judgment: Judgment normal.     Results: Results for orders placed or performed in visit on 01/23/21 (from the past 24 hour(s))  POCT Wet Prep with KOH     Status: Normal   Collection Time: 01/23/21  2:53 PM  Result Value Ref Range   Trichomonas, UA Negative    Clue Cells Wet Prep HPF POC neg    Epithelial Wet Prep HPF POC      Yeast Wet Prep HPF POC neg    Bacteria Wet Prep HPF POC     RBC Wet Prep HPF POC     WBC Wet Prep HPF POC     KOH Prep POC Negative Negative     Assessment/Plan: Vaginal discharge - Plan: POCT Wet Prep with KOH; neg wet prep and exam. Vaginal itching is external near inguinal area, most likely chem derm. Line dry underwear, can try lotrisone crm. F/u prn.   Perianal candidiasis - Plan: fluconazole (DIFLUCAN) 150 MG tablet, clotrimazole-betamethasone (LOTRISONE) cream; moist area with skin breakdown. Rx diflucan/lotrisone crm. Keep area dry. F/u prn.   Incontinence of feces, unspecified fecal incontinence type--"brown" d/c with odor is of fecal origin. F/u with PCP, question due to meds.    Meds ordered this encounter  Medications  . fluconazole (DIFLUCAN) 150 MG tablet    Sig: Take 1 tablet (150 mg total) by mouth once for 1 dose.    Dispense:  1 tablet    Refill:  0    Order Specific Question:   Supervising Provider    Answer:   Gae Dry [id number]  . clotrimazole-betamethasone (LOTRISONE) cream    Sig: Apply externally BID prn sx up to 2 wks    Dispense:  15 g    Refill:  0    Order Specific Question:   Supervising Provider    Answer:   Gae Dry [[id number]]      Return if symptoms worsen or fail to improve.  Cletis Muma B. Rimas Gilham, PA-C 01/23/2021 2:56 PM

## 2021-01-25 ENCOUNTER — Ambulatory Visit: Payer: Medicare Other

## 2021-01-28 ENCOUNTER — Institutional Professional Consult (permissible substitution): Admit: 2021-01-28 | Discharge: 2021-01-29 | Payer: MEDICARE

## 2021-01-28 DIAGNOSIS — L509 Urticaria, unspecified: Principal | ICD-10-CM

## 2021-01-29 ENCOUNTER — Encounter: Payer: Self-pay | Admitting: Internal Medicine

## 2021-01-29 ENCOUNTER — Other Ambulatory Visit: Payer: Self-pay

## 2021-01-29 ENCOUNTER — Ambulatory Visit (INDEPENDENT_AMBULATORY_CARE_PROVIDER_SITE_OTHER): Payer: Medicare Other | Admitting: Internal Medicine

## 2021-01-29 VITALS — BP 126/80 | HR 71 | Temp 98.3°F | Ht 63.0 in | Wt 244.3 lb

## 2021-01-29 DIAGNOSIS — L304 Erythema intertrigo: Secondary | ICD-10-CM | POA: Diagnosis not present

## 2021-01-29 DIAGNOSIS — L0390 Cellulitis, unspecified: Secondary | ICD-10-CM

## 2021-01-29 MED ORDER — FLUCONAZOLE 150 MG PO TABS: 150.0000 mg | ORAL_TABLET | ORAL | 0 refills | Status: DC

## 2021-01-29 MED ORDER — DOXYCYCLINE HYCLATE 100 MG PO TABS
100.0000 mg | ORAL_TABLET | Freq: Two times a day (BID) | ORAL | 0 refills | Status: DC
Start: 2021-01-29 — End: 2021-02-11

## 2021-01-29 MED ORDER — HYDROCORTISONE 2.5 % EX OINT: TOPICAL_OINTMENT | Freq: Two times a day (BID) | CUTANEOUS | 2 refills | Status: DC

## 2021-01-29 MED ORDER — MUPIROCIN 2 % EX OINT: 1.0000 "application " | TOPICAL_OINTMENT | Freq: Two times a day (BID) | CUTANEOUS | 2 refills | Status: DC

## 2021-01-29 MED ORDER — CLOTRIMAZOLE 1 % EX OINT
1.0000 | TOPICAL_OINTMENT | Freq: Two times a day (BID) | CUTANEOUS | 2 refills | Status: DC
Start: 2021-01-29 — End: 2021-04-17

## 2021-01-29 NOTE — Progress Notes (Signed)
Chief Complaint  Patient presents with  . Follow-up  . Vaginal Discharge  . Skin Problem    Burning sensation along lower abdomen where abdomen and groin meet    F/u  1. Skin rash with burning and odor left groin/lower abdomen and under breasts x 2 weeks and rectal region she does sweat a lot rash leaks fluid clear and unable to wear underwear wearing depends for now Nothing tried saw ob/gyn 01/23/21 negative GU w/o  Review of Systems  Respiratory: Negative for shortness of breath.   Cardiovascular: Negative for chest pain.  Skin: Positive for rash.   Past Medical History:  Diagnosis Date  . Allergic rhinitis   . Bipolar disorder ([place])   . Chronic low back pain   . CTS (carpal tunnel syndrome)    hands  . Depression   . Gallstones   . GERD (gastroesophageal reflux disease)   . History of prediabetes   . Hypertension   . Laceration of right foot   . Onychomycosis   . OSA on CPAP   . Overactive bladder   . PVC's (premature ventricular contractions)   . PVC's (premature ventricular contractions)    Dr. Raliegh Ip cards  . Seizures ([place])    8th grade   Past Surgical History:  Procedure Laterality Date  . ABDOMINAL HYSTERECTOMY  07/08/2011  . ABDOMINAL HYSTERECTOMY     supracervical in 07/2011   . CARPAL TUNNEL RELEASE     right hand 1996  . CESAREAN SECTION     2012  . CHOLECYSTECTOMY N/A 01/12/2018   Procedure: LAPAROSCOPIC CHOLECYSTECTOMY WITH INTRAOPERATIVE CHOLANGIOGRAM ERAS PATHWAY;  Surgeon: Johnathan Hausen, MD;  Location: [facility];  Service: General;  Laterality: N/A;  . COLONOSCOPY WITH PROPOFOL N/A 04/15/2019   Procedure: COLONOSCOPY WITH PROPOFOL;  Surgeon: Jonathon Bellows, MD;  Location: [facility] ENDOSCOPY;  Service: Gastroenterology;  Laterality: N/A;  . DILATION AND CURETTAGE OF UTERUS     04/2011 benign endometrial polyp squamous metaplasia, inflamm, blood, mucous, benign   . ESOPHAGOGASTRODUODENOSCOPY (EGD) WITH PROPOFOL N/A 10/23/2020   Procedure: ESOPHAGOGASTRODUODENOSCOPY  (EGD) WITH PROPOFOL;  Surgeon: Jonathon Bellows, MD;  Location: [facility] ENDOSCOPY;  Service: Gastroenterology;  Laterality: N/A;  . FOOT SURGERY     Dr. Prudence Davidson   . LEEP     2006  . TUBAL LIGATION     Family History  Problem Relation Age of Onset  . Cancer Father        esophageal   . Hypertension Father   . Heart failure Mother   . Hypertension Mother   . Early death Mother   . Heart disease Mother        chf  . Breast cancer Sister 33  . Cancer Sister        breast  . Depression Sister   . Mental illness Sister   . Asthma Son   . Cancer Other        brain cancer   Social History   Socioeconomic History  . Marital status: Married    Spouse name: Not on file  . Number of children: Not on file  . Years of education: Not on file  . Highest education level: Not on file  Occupational History  . Not on file  Tobacco Use  . Smoking status: Never Smoker  . Smokeless tobacco: Current User    Types: Snuff  . Tobacco comment: Is in process of decreasing amount  Vaping Use  . Vaping Use: Never used  Substance and Sexual Activity  .  Alcohol use: No    Alcohol/week: 0.0 standard drinks  . Drug use: No  . Sexual activity: Not Currently    Birth control/protection: Surgical    Comment: Hysterectomy  Other Topics Concern  . Not on file  Social History Narrative   Married    No guns    Wears seat belt    Safe in relationship    Never smoker    2 sons Gaspar Bidding ~10 and another son ~28 as of 10/2019   1 granson eli 1 y.o as of 10/2019   2 sisters    Unemployed       Social Determinants of Health   Financial Resource Strain: Low Risk   . Difficulty of Paying Living Expenses: Not hard at all  Food Insecurity: No Food Insecurity  . Worried About Charity fundraiser in the Last Year: Never true  . Ran Out of Food in the Last Year: Never true  Transportation Needs: No Transportation Needs  . Lack of Transportation (Medical): No  . Lack of Transportation (Non-Medical): No  Physical  Activity: Not on file  Stress: No Stress Concern Present  . Feeling of Stress : Not at all  Social Connections: Unknown  . Frequency of Communication with Friends and Family: Not on file  . Frequency of Social Gatherings with Friends and Family: Not on file  . Attends Religious Services: Not on file  . Active Member of Clubs or Organizations: Not on file  . Attends Archivist Meetings: Not on file  . Marital Status: Married  Human resources officer Violence: Not At Risk  . Fear of Current or Ex-Partner: No  . Emotionally Abused: No  . Physically Abused: No  . Sexually Abused: No   Current Meds  Medication Sig  . amLODipine (NORVASC) 5 MG tablet Take 1 tablet (5 mg total) by mouth daily.  . benztropine (COGENTIN) 1 MG tablet Take 1 mg by mouth daily.  . Biotin w/ Vitamins C & E (HAIR/SKIN/NAILS PO) Take 2,500 mcg by mouth daily.  . Blood Glucose Calibration (TRUE METRIX LEVEL 1) Low SOLN   . cetirizine (ZYRTEC ALLERGY) 10 MG tablet Take 2 tablets (20 mg total) by mouth daily.  . clobetasol cream (TEMOVATE) 4.49 % Apply 1 application topically 2 (two) times daily.  . Clotrimazole 1 % OINT Apply 1 application topically in the morning and at bedtime.  . clotrimazole-betamethasone (LOTRISONE) cream Apply externally BID prn sx up to 2 wks  . doxycycline (VIBRA-TABS) 100 MG tablet Take 1 tablet (100 mg total) by mouth 2 (two) times daily. With food  . EPINEPHrine 0.3 mg/0.3 mL IJ SOAJ injection Inject 0.3 mg into the muscle as needed for anaphylaxis.  . famotidine (PEPCID) 40 MG tablet Take 1 tablet (40 mg total) by mouth daily.  . fexofenadine (ALLEGRA) 180 MG tablet Take 2 tablets by mouth every morning.  . fexofenadine-pseudoephedrine (ALLEGRA-D) 60-120 MG 12 hr tablet Take 1 tablet by mouth 2 (two) times daily.  Marland Kitchen FIBER ADULT GUMMIES PO Take by mouth.  . fluconazole (DIFLUCAN) 150 MG tablet Take 1 tablet (150 mg total) by mouth once a week. X 2-4 weeks  . FLUoxetine (PROZAC) 10 MG  capsule Take 10 mg by mouth daily.  . fluPHENAZine (PROLIXIN) 2.5 MG tablet Take 1 tablet (2.5 mg total) by mouth at bedtime.  . fluticasone (FLONASE) 50 MCG/ACT nasal spray Place 2 sprays into both nostrils daily.  . hydrocortisone 2.5 % lotion Apply topically 2 (two) times daily.  Marland Kitchen  hydrocortisone 2.5 % ointment Apply topically 2 (two) times daily. Left lower abdomen  . hydrOXYzine (ATARAX/VISTARIL) 25 MG tablet Take 1 tablet (25 mg total) by mouth every 8 (eight) hours as needed.  . Insulin Pen Needle (PEN NEEDLES) 30G X 8 MM MISC 1 Device by Does not apply route daily.  . Melatonin 10 MG TABS Take by mouth at bedtime.  . metoprolol succinate (TOPROL-XL) 25 MG 24 hr tablet Take 1 tablet (25 mg total) by mouth daily. Take with or immediately following a meal.  . montelukast (SINGULAIR) 10 MG tablet Take 1 tablet (10 mg total) by mouth daily.  . Multiple Vitamins-Minerals (ALIVE MULTI-VITAMIN PO) Take by mouth daily in the afternoon.  . mupirocin ointment (BACTROBAN) 2 % Apply 1 application topically 3 (three) times daily. Scalp  . mupirocin ointment (BACTROBAN) 2 % Apply 1 application topically 2 (two) times daily.  . NON FORMULARY Zzzquil melatonin/ashwaghana 3 pills at night  . omalizumab Arvid Right) 150 MG/ML prefilled syringe Inject 300 mg into the skin every 30 (thirty) days.  Marland Kitchen oxyCODONE-acetaminophen (PERCOCET) 10-325 MG tablet Take 1 tablet by mouth 3 (three) times daily as needed.  . Probiotic Product (PROBIOTIC DAILY PO) Take by mouth. probiogen probiotic daily  . topiramate (TOPAMAX) 100 MG tablet Take 100 mg by mouth daily.   Allergies  Allergen Reactions  . Cephalexin Swelling and Rash  . Hctz [Hydrochlorothiazide] Rash and Swelling    Mild she says  . Lisinopril Hives and Other (See Comments)    Headache, Low BP, angioedema    . Losartan Hives and Swelling    Angioedema  . Penicillins Rash    Has patient had a PCN reaction causing immediate rash, facial/tongue/throat  swelling, SOB or lightheadedness with hypotension: Yes Has patient had a PCN reaction causing severe rash involving mucus membranes or skin necrosis: No Has patient had a PCN reaction that required hospitalization: No Has patient had a PCN reaction occurring within the last 10 years: No If all of the above answers are "NO", then may proceed with Cephalosporin use.   . Bee Venom     Swelling   . Metformin And Related     Rash    Recent Results (from the past 2160 hour(s))  POCT Wet Prep with KOH     Status: Normal   Collection Time: 01/23/21  2:53 PM  Result Value Ref Range   Trichomonas, UA Negative    Clue Cells Wet Prep HPF POC neg    Epithelial Wet Prep HPF POC     Yeast Wet Prep HPF POC neg    Bacteria Wet Prep HPF POC     RBC Wet Prep HPF POC     WBC Wet Prep HPF POC     KOH Prep POC Negative Negative   Objective  Body mass index is 43.28 kg/m. Wt Readings from Last 3 Encounters:  01/29/21 244 lb 4.8 oz (110.8 kg)  01/23/21 247 lb (112 kg)  01/04/21 240 lb 4.8 oz (109 kg)   Temp Readings from Last 3 Encounters:  01/29/21 98.3 F (36.8 C) (Oral)  01/04/21 98.3 F (36.8 C) (Oral)  12/25/20 98.1 F (36.7 C) (Oral)   BP Readings from Last 3 Encounters:  01/29/21 126/80  01/23/21 126/80  01/04/21 118/67   Pulse Readings from Last 3 Encounters:  01/29/21 71  01/04/21 74  12/25/20 70    Physical Exam Vitals and nursing note reviewed.  Constitutional:      Appearance: Normal appearance. She  is well-developed and well-groomed. She is morbidly obese.  HENT:     Head: Normocephalic and atraumatic.  Cardiovascular:     Rate and Rhythm: Normal rate and regular rhythm.     Heart sounds: Normal heart sounds. No murmur heard.   Pulmonary:     Effort: Pulmonary effort is normal.     Breath sounds: Normal breath sounds.  Chest:    Abdominal:    Skin:    General: Skin is warm and dry.  Neurological:     General: No focal deficit present.     Mental  Status: She is alert and oriented to person, place, and time. Mental status is at baseline.     Gait: Gait normal.  Psychiatric:        Attention and Perception: Attention and perception normal.        Mood and Affect: Mood and affect normal.        Speech: Speech normal.        Behavior: Behavior normal. Behavior is cooperative.        Thought Content: Thought content normal.        Cognition and Memory: Cognition and memory normal.     Assessment  Plan  Intertrigo - Plan: fluconazole (DIFLUCAN) 150 MG tablet weekly x 2-4 weeks, hydrocortisone 2.5 % ointment, mupirocin ointment (BACTROBAN) 2 %, Clotrimazole 1 % OINT  Cellulitis,left lower abdomen  - Plan: doxycycline (VIBRA-TABS) 100 MG tablet bid x 7 days with food   HM Declines flu shotallergic Tdap utd due 7/13/2020will do in future at f/u [place] 2/2 consider boosterask allergy about this Allergic to pna vaccine Consider shingrix future Consider shingrix but given allergies use caution Not immuneMMR, hep B titer in futureuse caution though with h/o allergies with all vaccines 915/21 house calls visit A1c 6.1  h/o supracervical hysterectomy ([facility]), pap 02/29/16 negative no comment HPV, another pap 06/23/17 neg pap neg HPV  -h/o LEEP 2006  mammo 08/04/19 negativeordered11/17/21 negativeordered 2022  Colonoscopy7/10/20 normal f/u in 10 years egd sch 10/23/20 Dr. Joycie Peek bxs 10/23/20 aperistalsis and food +consider gastric emptying study and esophageal mamometry 10/25/20 Dr. Bailey Mech Dr Jacklynn Lewis,   I will see her next Wednesday 12/01/20 at 8:15 am- march I agree is way too long , I will order the tests at her office visit after I discuss about it with her .Thank you for reaching out to me.   Yes there has been some case reports of Victoza causing GI tract dysmotility - is holding the medication for a while an option?    Echo Dr.Kowalski  -nl LV function EF 60% moderate mitral insuff and mild tricuspid  insufficiency treadmill stress test neg 07/30/13 -h/o PVCS  Hep C neg 08/17/20 HIV neg 02/04/16  Ob/gyn appt pending bladder leakage [facility](stopped PT w/o help worsen sneezing or coughing) -Dr. Georgianne Fick pt wants surgeryas of 04/20/20 sx's better and no surgery indicated  Consider referral repeat sleep study h/o OSA  Provider: Dr. Olivia Mackie McLean-Scocuzza-Internal Medicine

## 2021-01-29 NOTE — Patient Instructions (Addendum)
All 3 creams use 2x per day bactroban, hydrocortisone and clomtrimazole  Also diflucan and doxycycline   Gold bond talc free powder in creases of belly and breasts   Let me know in 2 weeks if not better  For now hydrocortisone 1% 2x per day   For rectum Butt paste or diaper dermatitis diaper rash cream A&D or desitin with clotrimazole over the counter 2x per day with vasoline for rectum   Stop omega 3 fish oil call and ask to stop sending to you  Try tablet or capsule for multivitamin nature made or centrum    Petra Kuba made or nature wise tumeric/ginger good for joints, collagen vital proteins powder unsweet/unflavored, voltaren gel (over the counter 4x per day)    Intertrigo Intertrigo is skin irritation or inflammation (dermatitis) that occurs when folds of skin rub together. The irritation can cause a rash and make skin raw and itchy. This condition most commonly occurs in the skin folds of these areas:  Toes.  Armpits.  Groin.  Under the belly.  Under the breasts.  Buttocks. Intertrigo is not passed from person to person (is not contagious). What are the causes? This condition is caused by heat, moisture, rubbing (friction), and not enough air circulation. The condition can be made worse by:  Sweat.  Bacteria.  A fungus, such as yeast. What increases the risk? This condition is more likely to occur if you have moisture in your skin folds. You are more likely to develop this condition if you:  Have diabetes.  Are overweight.  Are not able to move around or are not active.  Live in a warm and moist climate.  Wear splints, braces, or other medical devices.  Are not able to control your bowels or bladder (have incontinence). What are the signs or symptoms? Symptoms of this condition include:  A pink or red skin rash in the skin fold or near the skin fold.  Raw or scaly skin.  Itchiness.  A burning feeling.  Bleeding.  Leaking fluid.  A bad smell. How  is this diagnosed? This condition is diagnosed with a medical history and physical exam. You may also have a skin swab to test for bacteria or a fungus. How is this treated? This condition may be treated by:  Cleaning and drying your skin.  Taking an antibiotic medicine or using an antibiotic skin cream for a bacterial infection.  Using an antifungal cream on your skin or taking pills for an infection that was caused by a fungus, such as yeast.  Using a steroid ointment to relieve itchiness and irritation.  Separating the skin fold with a clean cotton cloth to absorb moisture and allow air to flow into the area. Follow these instructions at home:  Keep the affected area clean and dry.  Do not scratch your skin.  Stay in a cool environment as much as possible. Use an air conditioner or fan, if available.  Apply over-the-counter and prescription medicines only as told by your health care provider.  If you were prescribed an antibiotic medicine, use it as told by your health care provider. Do not stop using the antibiotic even if your condition improves.  Keep all follow-up visits as told by your health care provider. This is important. How is this prevented?  Maintain a healthy weight.  Take care of your feet, especially if you have diabetes. Foot care includes: ? Wearing shoes that fit well. ? Keeping your feet dry. ? Wearing clean, breathable socks.  Protect the skin around your groin and buttocks, especially if you have incontinence. Skin protection includes: ? Following a regular cleaning routine. ? Using skin protectant creams, powders, or ointments. ? Changing protection pads frequently.  Do not wear tight clothes. Wear clothes that are loose, absorbent, and made of cotton.  Wear a bra that gives good support, if needed.  Shower and dry yourself well after activity or exercise. Use a hair dryer on a cool setting to dry between skin folds, especially after you  bathe.  If you have diabetes, keep your blood sugar under control.   Contact a health care provider if:  Your symptoms do not improve with treatment.  Your symptoms get worse or they spread.  You notice increased redness and warmth.  You have a fever. Summary  Intertrigo is skin irritation or inflammation (dermatitis) that occurs when folds of skin rub together.  This condition is caused by heat, moisture, rubbing (friction), and not enough air circulation.  This condition may be treated by cleaning and drying your skin and with medicines.  Apply over-the-counter and prescription medicines only as told by your health care provider.  Keep all follow-up visits as told by your health care provider. This is important. This information is not intended to replace advice given to you by your health care provider. Make sure you discuss any questions you have with your health care provider. Document Revised: 02/22/2018 Document Reviewed: 02/22/2018 Elsevier Patient Education  2021 [employer].

## 2021-02-04 ENCOUNTER — Telehealth: Payer: Medicare Other

## 2021-02-08 ENCOUNTER — Telehealth: Payer: Self-pay | Admitting: Internal Medicine

## 2021-02-08 NOTE — Telephone Encounter (Signed)
Patient called and wanted to speak to Dr. Claris Gladden assistant. When ask the reason she stated she wanted Dr Olivia Mackie to call her in antibiotics. Patient would not give front office the reason why she needed antibiotics.

## 2021-02-08 NOTE — Telephone Encounter (Signed)
Patient states the spot on her abdomen has discharge and an odor. Informed the Patient that this will need to be evaluated in person before sending in more antibiotics to determine if there needs to be different treatment.   Advised Patient that she should be seen soon by an urgent care so that the discharge can be tested and maker sure she does not feel worse while waiting. Patient declined multiple times to go to urgent care.   Scheduled for in person appointment 02/12/21 with Dr Olivia Mackie McLean-Scocuzza. Patient states she will wait and will not go to urgent care as it is now. States that if she gets worse before Tuesday 02/12/21 she will go to urgent care.

## 2021-02-11 ENCOUNTER — Other Ambulatory Visit: Payer: Self-pay

## 2021-02-11 ENCOUNTER — Ambulatory Visit (INDEPENDENT_AMBULATORY_CARE_PROVIDER_SITE_OTHER): Payer: Medicare Other | Admitting: Pharmacist

## 2021-02-11 DIAGNOSIS — M1711 Unilateral primary osteoarthritis, right knee: Secondary | ICD-10-CM

## 2021-02-11 DIAGNOSIS — F39 Unspecified mood [affective] disorder: Secondary | ICD-10-CM

## 2021-02-11 DIAGNOSIS — Z87898 Personal history of other specified conditions: Secondary | ICD-10-CM

## 2021-02-11 DIAGNOSIS — L509 Urticaria, unspecified: Secondary | ICD-10-CM

## 2021-02-11 DIAGNOSIS — K220 Achalasia of cardia: Secondary | ICD-10-CM

## 2021-02-11 DIAGNOSIS — I10 Essential (primary) hypertension: Secondary | ICD-10-CM

## 2021-02-11 DIAGNOSIS — J309 Allergic rhinitis, unspecified: Secondary | ICD-10-CM

## 2021-02-11 NOTE — Patient Instructions (Addendum)
Heather [facility],   It was great talking with you today!  You should NOT crush the metoprolol succinate ER 25 mg. This must be taken WHOLE.   If you have itching or anxiety, we would rather you take the hydroxyzine more often than the Benadryl.   This is your medication list:  Morning: - Fluoxetine 20 mg - mood - Benztropine 1 mg - side effects of mood medications - Amlodipine 5 mg - blood pressure - Metoprolol succinate ER 25 mg - blood pressure  - Famotidine 40 mg - acid reflux - Meloxicam 15 mg - pain/inflammation - Fexofenadine 180 mg - 2 tablets - allergies  Evening: - Topiramate 200 mg - mood - Benztropine 1 mg - side effects of mood medications - Fluphenazine - 2.5 mg tablets and 5 mg tablet - mood - Montelukast 10 mg - allergies - Cetirizine 10 mg - 2 tablets - allergies  You also take oxycodone/acetaminophen 10/325 mg three times daily for pain  Your non-pill medications are:  - Xolair injection - allergies  - Fluticasone (Flonase) nasal spray - allergies - Hydroxyzine 25 mg - allergies or anxiety  - clobetasol or hydrocortisone cream - skin allergies - Clotrimazole or clotrimazole/betamethasone - yeast infections  Your as needed medications are:  - Fluconazole 150 mg - if you have a bad yeast infection that is not relieved by the creams above - Epi pen    Your supplements are: Biotin (hair, skin, and nails) gummies, fiber gummies, melatonin, multivitamin gummies (you only need one or the other) and a multivitamin probiotic.   Always ask your pharmacist, or call me, if someone adds a medication BEFORE you start crushing it.   Let's have a follow up appointment in August to see how you are doing.   Take care!  Heather [facility], PharmD [phone]  Visit Information   PATIENT GOALS:  Goals Addressed              This Visit's Progress     Patient Stated   .  Medication Monitoring (pt-stated)        Patient Goals/Self-Care Activities . Over the next 90 days,  patient will:  - take medications as prescribed focus on medication adherence by using medication list provided today       Consent to CCM Services: [facility] was given information about Chronic Care Management services today including:  1. CCM service includes personalized support from designated clinical staff supervised by her physician, including individualized plan of care and coordination with other care providers 2. 24/7 contact phone numbers for assistance for urgent and routine care needs. 3. Service will only be billed when office clinical staff spend 20 minutes or more in a month to coordinate care. 4. Only one practitioner may furnish and bill the service in a calendar month. 5. The patient may stop CCM services at any time (effective at the end of the month) by phone call to the office staff. 6. The patient will be responsible for cost sharing (co-pay) of up to 20% of the service fee (after annual deductible is met).  Patient agreed to services and verbal consent obtained.   Print copy of patient instructions, educational materials, and care plan provided in person.  Plan: Face to Face appointment with care management team member scheduled for: ~ 36 weeks  Heather [facility], PharmD, [facility], CPP Clinical Pharmacist [place] [facility]: Patient Care Plan: Medication Management    Problem Identified: Asthma, Allergies, Mood Disorder,  Chronic Pain     Long-Range Goal: Disease Progression Prevention   Start Date: 02/11/2021  This Visit's Progress: On track  Priority: High  Note:   Current Barriers:  . Complex patient with multiple comorbidities at high risk of exacerbation  Pharmacist Clinical Goal(s):  Marland Kitchen Over the next 90 days, patient will achieve adherence to monitoring guidelines and medication adherence to achieve therapeutic efficacy through collaboration with PharmD and provider.   Interventions: . 1:1  collaboration with McLean-Scocuzza, Heather Glow, MD regarding development and update of comprehensive plan of care as evidenced by provider attestation and co-signature . Inter-disciplinary care team collaboration (see longitudinal plan of care) . Comprehensive medication review performed; medication list updated in electronic medical record  SDOH: . No medication cost concerns, Medicare + Medicaid.  . Reports she lives with her husband. 2 kids. They went to [place] yesterday for Mother's Day.   Health Maintenance: . Reports that due to the diagnosis of dysphagia, she has been crushing all of her tablet medications and mixing with applesauce. Reviewed medications today. All can be crushed except for metoprolol succinate. Advised to take this one whole. She verbalizes understanding . Discussed benefit of collaboration w/ RN CM for disease management education and care collaboration support. Patient amenable. Will ask RN CM to outreach patient.   Hypertension: . Controlled; current treatment: amlodipine 5 mg, metoprolol succinate 25 mg daily . Current home readings: did not discuss today.  . Will review benefit of home monitoring moving forward.   Asthma/Allergies . Uncontrolled; current treatment: Xolair 300 mg, fexofenadine 360 mg QAM, cetirizine 20 mg QPM, montelukast 10 mg daily, hydroxyzine 25 mg 1-3 tablets PRN anxiety, fluticasone nasal spray BID, topical clobetasol 0.05% ointment, topical hydrocortisone 2.5% ointment PRN; follows w/ [facility] . Reports that she has been taking diphenhydramine TID. Reviewed anticholinergic side effects of diphenhydramine. Encouraged to take hydroxyzine PRN skin allergies, itching first, rather than relying on diphenhydramine throughout the day.  . Reviewed purpose of topical ointments.  . Encouraged continued collaboration with [facility]/immunology. Patient to discuss hydroxychloroquine with her eye doctor. This medication is fine to crush.   Mood  Disorder . Moderately well controlled; current treatment: fluoxetine 20 mg daily, fluphenazine 7.5 mg (2.5 mg w/ 5 mg tablet) QPM, benztropine 1 mg, topiramate 200 mg daily, hydroxyzine 25 mg PRN as above; follows w/ Ander Slade  . Recommend to continue current regimen at this time, along with collaboration with psych.   Pain: . Moderately well managed; current regimen: oxycodone/APAP 10/325 mg TID - pain management, meloxicam 15 mg daily - orthopedics . Encouraged to continue current regimen at this time  Acute topical concerns - intertrigo: . Plans to f/u with PCP tomorrow regarding stomach rash; currently using topical clotrimazole 1% cream and clotrimazole/beclomethasone 1/0.05%, used fluconazole 150 mg x1, zinc oxide 20% PRN  . Advised to complete clotrimazole/beclomethasone for vaginal irritation first. Follow with PCP tomorrow as scheduled  Supplements: Gummies - biotin, multivitamin, probiotic, fiber, melatonin  Patient Goals/Self-Care Activities . Over the next 90 days, patient will:  - take medications as prescribed focus on medication adherence by using medication list provided today  Follow Up Plan: Face to Face appointment with care management team member scheduled for:  ~12 weeks

## 2021-02-11 NOTE — Chronic Care Management (AMB) (Signed)
Chronic Care Management Pharmacy Note  02/11/2021 Name:  Heather Sweeney MRN:  [id number] DOB:  Apr 09, 1966  Subjective: Heather Sweeney is an 55 y.o. year old female who is a primary patient of McLean-Scocuzza, Nino Glow, MD.  The CCM team was consulted for assistance with disease management and care coordination needs.    Engaged with patient face to face for re-establishment with CCM in response to provider referral for pharmacy case management and/or care coordination services.   Consent to Services:  The patient was given information about Chronic Care Management services, agreed to services, and gave verbal consent prior to initiation of services.  Please see initial visit note for detailed documentation.   Patient Care Team: McLean-Scocuzza, Nino Glow, MD as PCP - General (Internal Medicine) De Hollingshead, [place] as Pharmacist (Pharmacist)  Recent office visits:  4/26 - PCP for intertrigo  Recent consult visits:  4/20 - OBGYN Copland, given script for fluconazole 150 mg, lortrisone topically, discuss rectal incontinence w/ PCP   4/15 Clista Bernhardt allergist- steroid taper, Baron Sane, discuss HCQ w/ eye doctor, continue fexofenadine 2 QAM, cetirizine 2 QPM, hydroxyzine nad benadryl as needed  3/22 - GI Anna- improved dysphagia since stopping Victoza, continue famotidine  Hospital visits:  4/1 - ED for knee pain, likely sprain, f/u ortho  Objective:  Lab Results  Component Value Date   CREATININE 0.96 08/17/2020   CREATININE 1.03 05/21/2020   CREATININE 0.98 11/21/2019    Lab Results  Component Value Date   HGBA1C 5.3 05/19/2019   Last diabetic Eye exam: No results found for: HMDIABEYEEXA  Last diabetic Foot exam: No results found for: HMDIABFOOTEX      Component Value Date/Time   CHOL 127 05/21/2020 0923   TRIG 52.0 05/21/2020 0923   HDL 51.40 05/21/2020 0923   CHOLHDL 2 05/21/2020 0923   VLDL 10.4 05/21/2020 0923   LDLCALC 65 05/21/2020 0923    LDLCALC 84 05/19/2019 1511    Hepatic Function Latest Ref Rng & Units 08/17/2020 05/21/2020 11/21/2019  Total Protein 6.0 - 8.3 g/dL 6.1 6.3 7.2  Albumin 3.5 - 5.2 g/dL 3.8 4.0 4.2  AST 0 - 37 U/L '12 15 13  ' ALT 0 - 35 U/L '19 18 14  ' Alk Phosphatase 39 - 117 U/L 50 65 74  Total Bilirubin 0.2 - 1.2 mg/dL 0.4 0.4 0.3    Lab Results  Component Value Date/Time   TSH 1.23 05/21/2020 09:23 AM   TSH 0.76 02/10/2019 02:59 PM    CBC Latest Ref Rng & Units 05/21/2020 11/21/2019 05/19/2019  WBC 4.0 - 10.5 K/uL 6.2 5.6 5.4  Hemoglobin 12.0 - 15.0 g/dL 12.1 12.2 11.5(L)  Hematocrit 36.0 - 46.0 % 34.8(L) 35.5(L) 33.2(L)  Platelets 150.0 - 400.0 K/uL 184.0 207.0 225    Lab Results  Component Value Date/Time   VD25OH 41 02/10/2019 02:59 PM    Clinical ASCVD: No    Social History   Tobacco Use  Smoking Status Never Smoker  Smokeless Tobacco Current User  . Types: Snuff  Tobacco Comment   Is in process of decreasing amount   BP Readings from Last 3 Encounters:  01/29/21 126/80  01/23/21 126/80  01/04/21 118/67   Pulse Readings from Last 3 Encounters:  01/29/21 71  01/04/21 74  12/25/20 70   Wt Readings from Last 3 Encounters:  01/29/21 244 lb 4.8 oz (110.8 kg)  01/23/21 247 lb (112 kg)  01/04/21 240 lb 4.8 oz (109 kg)  Assessment: Review of patient past medical history, allergies, medications, health status, including review of consultants reports, laboratory and other test data, was performed as part of comprehensive evaluation and provision of chronic care management services.   SDOH:  (Social Determinants of Health) assessments and interventions performed:  SDOH Interventions   Flowsheet Row Most Recent Value  SDOH Interventions   Financial Strain Interventions Intervention Not Indicated      CCM Care Plan  Allergies  Allergen Reactions  . Cephalexin Swelling and Rash  . Hctz [Hydrochlorothiazide] Rash and Swelling    Mild she says  . Lisinopril Hives and Other  (See Comments)    Headache, Low BP, angioedema    . Losartan Hives and Swelling    Angioedema  . Penicillins Rash    Has patient had a PCN reaction causing immediate rash, facial/tongue/throat swelling, SOB or lightheadedness with hypotension: Yes Has patient had a PCN reaction causing severe rash involving mucus membranes or skin necrosis: No Has patient had a PCN reaction that required hospitalization: No Has patient had a PCN reaction occurring within the last 10 years: No If all of the above answers are "NO", then may proceed with Cephalosporin use.   . Bee Venom     Swelling   . Metformin And Related     Rash     Medications Reviewed Today    Reviewed by De Hollingshead, RPH-CPP (Pharmacist) on 02/11/21 at 1609  Med List Status: <None>  Medication Order Taking? Sig Documenting Provider Last Dose Status Informant  amLODipine (NORVASC) 5 MG tablet [id number] Yes Take 1 tablet (5 mg total) by mouth daily. McLean-Scocuzza, Nino Glow, MD Taking Active Self  benztropine (COGENTIN) 1 MG tablet [id number] Yes Take 1 mg by mouth 2 (two) times daily. [provider] Taking Active Self  Biotin w/ Vitamins C & E (HAIR/SKIN/NAILS PO) [id number] Yes Take 2,500 mcg by mouth daily. [provider] Taking Active   Blood Glucose Calibration (TRUE METRIX LEVEL 1) Low SOLN [id number] Yes  [provider] Taking Active Self  cetirizine (ZYRTEC ALLERGY) 10 MG tablet [id number] Yes Take 2 tablets (20 mg total) by mouth daily. McLean-Scocuzza, Nino Glow, MD Taking Active Self  clobetasol cream (TEMOVATE) 0.05 % [id number] Yes Apply 1 application topically 2 (two) times daily. [provider] Taking Active Self  Clotrimazole 1 % OINT [id number] Yes Apply 1 application topically in the morning and at bedtime. McLean-Scocuzza, Nino Glow, MD Taking Active   clotrimazole-betamethasone Donalynn Furlong) cream [id number] Yes Apply externally BID prn sx up to 2 wks Copland, Deirdre Evener, PA-C  Taking Active   EPINEPHrine 0.3 mg/0.3 mL IJ SOAJ injection [id number] No Inject 0.3 mg into the muscle as needed for anaphylaxis.  Patient not taking: Reported on 02/11/2021   McLean-Scocuzza, Nino Glow, MD Not Taking Active Self  famotidine (PEPCID) 40 MG tablet [id number] Yes Take 1 tablet (40 mg total) by mouth daily. Jonathon Bellows, MD Taking Active   fexofenadine [facility]) 180 MG tablet [id number] Yes Take 2 tablets by mouth every morning. [provider] Taking Active Self  FIBER ADULT GUMMIES PO [id number] Yes Take by mouth. [provider] Taking Active   fluconazole (DIFLUCAN) 150 MG tablet [id number]  Take 1 tablet (150 mg total) by mouth once a week. X 2-4 weeks McLean-Scocuzza, Nino Glow, MD  Active   FLUoxetine (PROZAC) 20 MG tablet [id number] Yes Take 20 mg by mouth daily. [provider] Taking Active Self  fluPHENAZine (PROLIXIN) 2.5 MG tablet [id number] Yes  Take 1 tablet (2.5 mg total) by mouth at bedtime.  Patient taking differently: Take 2.5 mg by mouth at bedtime. Take with 5 mg to equal 7.5 mg   McLean-Scocuzza, Nino Glow, MD Taking Active   fluPHENAZine (PROLIXIN) 5 MG tablet [id number] Yes Take 5 mg by mouth daily. Take with 2.5 mg to equal 7.5 mg [provider] Taking Active   fluticasone (FLONASE) 50 MCG/ACT nasal spray [id number] Yes Place 2 sprays into both nostrils daily. McLean-Scocuzza, Nino Glow, MD Taking Active Self  hydrocortisone 2.5 % ointment [id number] Yes Apply topically 2 (two) times daily. Left lower abdomen McLean-Scocuzza, Nino Glow, MD Taking Active   hydrOXYzine (ATARAX/VISTARIL) 25 MG tablet [id number] Yes Take 1 tablet (25 mg total) by mouth every 8 (eight) hours as needed. McLean-Scocuzza, Nino Glow, MD Taking Active Self  Melatonin 10 MG TABS [id number] Yes Take by mouth at bedtime. [provider] Taking Active   meloxicam (MOBIC) 15 MG tablet [id number] Yes Take 15 mg by mouth daily. [provider] Taking Active    metoprolol succinate (TOPROL-XL) 25 MG 24 hr tablet [id number] Yes Take 1 tablet (25 mg total) by mouth daily. Take with or immediately following a meal. McLean-Scocuzza, Nino Glow, MD Taking Active Self  montelukast (SINGULAIR) 10 MG tablet [id number] Yes Take 1 tablet (10 mg total) by mouth daily. McLean-Scocuzza, Nino Glow, MD Taking Active Self  Multiple Vitamins-Minerals (ALIVE MULTI-VITAMIN PO) [id number] Yes Take by mouth daily in the afternoon. [provider] Taking Active Self  mupirocin ointment (BACTROBAN) 2 % [id number] No Apply 1 application topically 3 (three) times daily. Scalp  Patient not taking: Reported on 02/11/2021   McLean-Scocuzza, Nino Glow, MD Not Taking Active Self  mupirocin ointment (BACTROBAN) 2 % [id number] No Apply 1 application topically 2 (two) times daily.  Patient not taking: Reported on 02/11/2021   McLean-Scocuzza, Nino Glow, MD Not Taking Active   omalizumab Arvid Right) 150 MG/ML prefilled syringe [id number] Yes Inject 300 mg into the skin every 14 (fourteen) days. [provider] Taking Active   oxyCODONE-acetaminophen (PERCOCET) 10-325 MG tablet [id number] Yes Take 1 tablet by mouth 3 (three) times daily as needed. [provider] Taking Active Self  Probiotic Product (PROBIOTIC DAILY PO) [id number] Yes Take by mouth. probiogen probiotic daily [provider] Taking Active Self  topiramate (TOPAMAX) 200 MG tablet [id number] Yes Take 200 mg by mouth daily. [provider] Taking Active Self  zinc oxide 20 % ointment [id number] Yes Apply 1 application topically 2 (two) times daily as needed for irritation. [provider] Taking Active           Patient Active Problem List   Diagnosis Date Noted  . Arthritis of right knee 10/25/2020  . Aperistalsis, esophagus 10/25/2020  . Insomnia 10/11/2020  . Elevated liver enzymes 08/17/2020  . Prediabetes 07/24/2020  . Arthritis of both knees 06/08/2020  . Annual physical exam  04/20/2020  . Ingrown right big toenail 04/20/2020  . Gastroesophageal reflux disease 04/20/2020  . Pain and swelling of right knee 04/20/2020  . Mitral valve insufficiency 01/10/2020  . Bacterial vaginosis 10/20/2019  . Encounter for screening colonoscopy   . Hives 02/23/2019  . Vitamin D deficiency 02/01/2019  . Mood disorder ([place]) 12/24/2018  . HLD (hyperlipidemia) 09/01/2018  . History of prediabetes 09/01/2018  . Chronic pain 08/31/2018  . Allergic rhinitis 08/31/2018  . Lumbosacral radiculitis 02/19/2018  . S/P laparoscopic cholecystectomy April 2019 01/12/2018  . Angioedema 07/07/2017  . Bilateral carpal tunnel syndrome  11/03/2016  . Frequent PVCs 09/24/2016  . Morbid obesity with BMI of 40.0-44.9, adult ([place]) 08/07/2016  . Essential hypertension 07/10/2016  . OSA on CPAP 07/10/2016    Immunization History  Administered Date(s) Administered  . Influenza,inj,Quad PF,6+ Mos 07/12/2016, 06/09/2019  . Influenza-Unspecified 05/06/2020  . PFIZER(Purple Top)SARS-COV-2 Vaccination 01/17/2020, 02/07/2020  . Pneumococcal Polysaccharide-23 07/12/2016  . Tdap 04/17/2009    Conditions to be addressed/monitored: Pulmonary Disorder, Allergies, Mood Disorder, Chronic Pain  Care Plan : Medication Management  Updates made by De Hollingshead, RPH-CPP since 02/11/2021 12:00 AM    Problem: Asthma, Allergies, Mood Disorder, Chronic Pain     Long-Range Goal: Disease Progression Prevention   Start Date: 02/11/2021  This Visit's Progress: On track  Priority: High  Note:   Current Barriers:  . Complex patient with multiple comorbidities at high risk of exacerbation  Pharmacist Clinical Goal(s):  Marland Kitchen Over the next 90 days, patient will achieve adherence to monitoring guidelines and medication adherence to achieve therapeutic efficacy through collaboration with PharmD and provider.   Interventions: . 1:1 collaboration with McLean-Scocuzza, Nino Glow, MD regarding development and update of  comprehensive plan of care as evidenced by provider attestation and co-signature . Inter-disciplinary care team collaboration (see longitudinal plan of care) . Comprehensive medication review performed; medication list updated in electronic medical record  SDOH: . No medication cost concerns, Medicare + Medicaid.  . Reports she lives with her husband. 2 kids. They went to [facility] yesterday for Mother's Day.   Health Maintenance: . Reports that due to the diagnosis of dysphagia, she has been crushing all of her tablet medications and mixing with applesauce. Reviewed medications today. All can be crushed except for metoprolol succinate. Advised to take this one whole. She verbalizes understanding . Discussed benefit of collaboration w/ RN CM for disease management education and care collaboration support. Patient amenable. Will ask RN CM to outreach patient.   Hypertension: . Controlled; current treatment: amlodipine 5 mg, metoprolol succinate 25 mg daily . Current home readings: did not discuss today.  . Will review benefit of home monitoring moving forward.   Asthma/Allergies . Uncontrolled; current treatment: Xolair 300 mg, fexofenadine 360 mg QAM, cetirizine 20 mg QPM, montelukast 10 mg daily, hydroxyzine 25 mg 1-3 tablets PRN anxiety, fluticasone nasal spray BID, topical clobetasol 0.05% ointment, topical hydrocortisone 2.5% ointment PRN; follows w/ [facility] . Reports that she has been taking diphenhydramine TID. Reviewed anticholinergic side effects of diphenhydramine. Encouraged to take hydroxyzine PRN skin allergies, itching first, rather than relying on diphenhydramine throughout the day.  . Reviewed purpose of topical ointments.  . Encouraged continued collaboration with allergy/immunology. Patient to discuss hydroxychloroquine with her eye doctor. This medication is fine to crush.   Mood Disorder . Moderately well controlled; current treatment: fluoxetine 20 mg daily,  fluphenazine 7.5 mg (2.5 mg w/ 5 mg tablet) QPM, benztropine 1 mg, topiramate 200 mg daily, hydroxyzine 25 mg PRN as above; follows w/ Ander Slade  . Recommend to continue current regimen at this time, along with collaboration with psych.   Pain: . Moderately well managed; current regimen: oxycodone/APAP 10/325 mg TID - pain management, meloxicam 15 mg daily - orthopedics . Encouraged to continue current regimen at this time  Acute topical concerns - intertrigo: . Plans to f/u with PCP tomorrow regarding stomach rash; currently using topical clotrimazole 1% cream and clotrimazole/beclomethasone 1/0.05%, used fluconazole 150 mg x1, zinc oxide 20% PRN  . Advised to complete clotrimazole/beclomethasone for vaginal irritation first. Follow  with PCP tomorrow as scheduled  Supplements: Gummies - biotin, multivitamin, probiotic, fiber, melatonin  Patient Goals/Self-Care Activities . Over the next 90 days, patient will:  - take medications as prescribed focus on medication adherence by using medication list provided today  Follow Up Plan: Face to Face appointment with care management team member scheduled for:  ~12 weeks      Medication Assistance: None required.  Patient affirms current coverage meets needs.  Patient's preferred pharmacy is:  [facility] [address] ([address]), [place] - [address] [place] ([place]) [place] [address] Phone: [phone] Fax: [place], [place] - [address] [place] [address] Phone: [phone] Fax: [phone]   Follow Up:  Patient agrees to Care Plan and Follow-up.  Plan: Face to Face appointment with care management team member scheduled for: ~ 12 weeks  Catie [name], PharmD, [place], [place] Clinical Pharmacist [employer] [facility] [employer] [phone]

## 2021-02-12 ENCOUNTER — Ambulatory Visit (INDEPENDENT_AMBULATORY_CARE_PROVIDER_SITE_OTHER): Payer: Medicare Other | Admitting: Internal Medicine

## 2021-02-12 ENCOUNTER — Encounter: Payer: Self-pay | Admitting: Internal Medicine

## 2021-02-12 VITALS — BP 124/70 | HR 81 | Temp 98.0°F | Ht 63.0 in | Wt 249.0 lb

## 2021-02-12 DIAGNOSIS — M5416 Radiculopathy, lumbar region: Secondary | ICD-10-CM

## 2021-02-12 DIAGNOSIS — R159 Full incontinence of feces: Secondary | ICD-10-CM

## 2021-02-12 DIAGNOSIS — R937 Abnormal findings on diagnostic imaging of other parts of musculoskeletal system: Secondary | ICD-10-CM

## 2021-02-12 DIAGNOSIS — M549 Dorsalgia, unspecified: Secondary | ICD-10-CM

## 2021-02-12 DIAGNOSIS — L304 Erythema intertrigo: Secondary | ICD-10-CM | POA: Diagnosis not present

## 2021-02-12 HISTORY — DX: Abnormal findings on diagnostic imaging of other parts of musculoskeletal system: R93.7

## 2021-02-12 HISTORY — DX: Full incontinence of feces: R15.9

## 2021-02-12 NOTE — Progress Notes (Signed)
Chief Complaint  Patient presents with  . Skin Problem   F/u  1. Stool incontinence since 01/2021 end and early 02/2021 with brown discharge x 2 weeks with h/o chronic back issues abnormal MRI lumbar 2016 chronic back pain f/u with pain clinic but new stool incontinence x 2 weeks  2. Left lower ab intertrigo improved but still wet at times used hct2.5%, clotrimazole, bactroban and now using gold bond talc free powder    Review of Systems  Constitutional: Negative for weight loss.  HENT: Negative for hearing loss.   Eyes: Negative for blurred vision.  Respiratory: Negative for shortness of breath.   Cardiovascular: Negative for chest pain.  Gastrointestinal:       +stool incontinence  Musculoskeletal: Positive for back pain. Negative for falls and joint pain.  Skin: Negative for rash.  Neurological: Negative for headaches.  Psychiatric/Behavioral: Negative for depression.   Past Medical History:  Diagnosis Date  . Allergic rhinitis   . Bipolar disorder ([place])   . Chronic low back pain   . CTS (carpal tunnel syndrome)    hands  . Depression   . Gallstones   . GERD (gastroesophageal reflux disease)   . History of prediabetes   . Hypertension   . Laceration of right foot   . Onychomycosis   . OSA on CPAP   . Overactive bladder   . PVC's (premature ventricular contractions)   . PVC's (premature ventricular contractions)    Dr. Raliegh Ip cards  . Seizures ([place])    8th grade   Past Surgical History:  Procedure Laterality Date  . ABDOMINAL HYSTERECTOMY  07/08/2011  . ABDOMINAL HYSTERECTOMY     supracervical in 07/2011   . CARPAL TUNNEL RELEASE     right hand 1996  . CESAREAN SECTION     2012  . CHOLECYSTECTOMY N/A 01/12/2018   Procedure: LAPAROSCOPIC CHOLECYSTECTOMY WITH INTRAOPERATIVE CHOLANGIOGRAM ERAS PATHWAY;  Surgeon: Johnathan Hausen, MD;  Location: [facility] ORS;  Service: General;  Laterality: N/A;  . COLONOSCOPY WITH PROPOFOL N/A 04/15/2019   Procedure: COLONOSCOPY WITH PROPOFOL;   Surgeon: Jonathon Bellows, MD;  Location: [facility] ENDOSCOPY;  Service: Gastroenterology;  Laterality: N/A;  . DILATION AND CURETTAGE OF UTERUS     04/2011 benign endometrial polyp squamous metaplasia, inflamm, blood, mucous, benign   . ESOPHAGOGASTRODUODENOSCOPY (EGD) WITH PROPOFOL N/A 10/23/2020   Procedure: ESOPHAGOGASTRODUODENOSCOPY (EGD) WITH PROPOFOL;  Surgeon: Jonathon Bellows, MD;  Location: [facility] ENDOSCOPY;  Service: Gastroenterology;  Laterality: N/A;  . FOOT SURGERY     Dr. Prudence Davidson   . LEEP     2006  . TUBAL LIGATION     Family History  Problem Relation Age of Onset  . Cancer Father        esophageal   . Hypertension Father   . Heart failure Mother   . Hypertension Mother   . Early death Mother   . Heart disease Mother        chf  . Breast cancer Sister 40  . Cancer Sister        breast  . Depression Sister   . Mental illness Sister   . Asthma Son   . Cancer Other        brain cancer   Social History   Socioeconomic History  . Marital status: Married    Spouse name: Not on file  . Number of children: Not on file  . Years of education: Not on file  . Highest education level: Not on file  Occupational History  .  Not on file  Tobacco Use  . Smoking status: Never Smoker  . Smokeless tobacco: Current User    Types: Snuff  . Tobacco comment: Is in process of decreasing amount  Vaping Use  . Vaping Use: Never used  Substance and Sexual Activity  . Alcohol use: No    Alcohol/week: 0.0 standard drinks  . Drug use: No  . Sexual activity: Not Currently    Birth control/protection: Surgical    Comment: Hysterectomy  Other Topics Concern  . Not on file  Social History Narrative   Married    No guns    Wears seat belt    Safe in relationship    Never smoker    2 sons Gaspar Bidding ~10 and another son ~28 as of 10/2019   1 granson eli 1 y.o as of 10/2019   2 sisters    Unemployed       Social Determinants of Health   Financial Resource Strain: Low Risk   . Difficulty of Paying  Living Expenses: Not hard at all  Food Insecurity: No Food Insecurity  . Worried About Charity fundraiser in the Last Year: Never true  . Ran Out of Food in the Last Year: Never true  Transportation Needs: No Transportation Needs  . Lack of Transportation (Medical): No  . Lack of Transportation (Non-Medical): No  Physical Activity: Not on file  Stress: No Stress Concern Present  . Feeling of Stress : Not at all  Social Connections: Unknown  . Frequency of Communication with Friends and Family: Not on file  . Frequency of Social Gatherings with Friends and Family: Not on file  . Attends Religious Services: Not on file  . Active Member of Clubs or Organizations: Not on file  . Attends Archivist Meetings: Not on file  . Marital Status: Married  Human resources officer Violence: Not At Risk  . Fear of Current or Ex-Partner: No  . Emotionally Abused: No  . Physically Abused: No  . Sexually Abused: No   Current Meds  Medication Sig  . amLODipine (NORVASC) 5 MG tablet Take 1 tablet (5 mg total) by mouth daily.  . benztropine (COGENTIN) 1 MG tablet Take 1 mg by mouth 2 (two) times daily.  . Biotin w/ Vitamins C & E (HAIR/SKIN/NAILS PO) Take 2,500 mcg by mouth daily.  . Blood Glucose Calibration (TRUE METRIX LEVEL 1) Low SOLN   . cetirizine (ZYRTEC ALLERGY) 10 MG tablet Take 2 tablets (20 mg total) by mouth daily.  . clobetasol cream (TEMOVATE) 4.35 % Apply 1 application topically 2 (two) times daily.  . Clotrimazole 1 % OINT Apply 1 application topically in the morning and at bedtime.  . clotrimazole-betamethasone (LOTRISONE) cream Apply externally BID prn sx up to 2 wks  . EPINEPHrine 0.3 mg/0.3 mL IJ SOAJ injection Inject 0.3 mg into the muscle as needed for anaphylaxis.  . famotidine (PEPCID) 40 MG tablet Take 1 tablet (40 mg total) by mouth daily.  . fexofenadine (ALLEGRA) 180 MG tablet Take 2 tablets by mouth every morning.  Marland Kitchen FIBER ADULT GUMMIES PO Take by mouth.  .  fluconazole (DIFLUCAN) 150 MG tablet Take 1 tablet (150 mg total) by mouth once a week. X 2-4 weeks  . FLUoxetine (PROZAC) 20 MG tablet Take 20 mg by mouth daily.  . fluPHENAZine (PROLIXIN) 2.5 MG tablet Take 1 tablet (2.5 mg total) by mouth at bedtime. (Patient taking differently: Take 2.5 mg by mouth at bedtime. Take with 5 mg  to equal 7.5 mg)  . fluPHENAZine (PROLIXIN) 5 MG tablet Take 5 mg by mouth daily. Take with 2.5 mg to equal 7.5 mg  . fluticasone (FLONASE) 50 MCG/ACT nasal spray Place 2 sprays into both nostrils daily.  . hydrocortisone 2.5 % ointment Apply topically 2 (two) times daily. Left lower abdomen  . hydrOXYzine (ATARAX/VISTARIL) 25 MG tablet Take 1 tablet (25 mg total) by mouth every 8 (eight) hours as needed.  . Melatonin 10 MG TABS Take by mouth at bedtime.  . meloxicam (MOBIC) 15 MG tablet Take 15 mg by mouth daily.  . metoprolol succinate (TOPROL-XL) 25 MG 24 hr tablet Take 1 tablet (25 mg total) by mouth daily. Take with or immediately following a meal.  . montelukast (SINGULAIR) 10 MG tablet Take 1 tablet (10 mg total) by mouth daily.  . Multiple Vitamins-Minerals (ALIVE MULTI-VITAMIN PO) Take by mouth daily in the afternoon.  . mupirocin ointment (BACTROBAN) 2 % Apply 1 application topically 3 (three) times daily. Scalp  . mupirocin ointment (BACTROBAN) 2 % Apply 1 application topically 2 (two) times daily.  Marland Kitchen omalizumab Arvid Right) 150 MG/ML prefilled syringe Inject 300 mg into the skin every 14 (fourteen) days.  Marland Kitchen oxyCODONE-acetaminophen (PERCOCET) 10-325 MG tablet Take 1 tablet by mouth 3 (three) times daily as needed.  . Probiotic Product (PROBIOTIC DAILY PO) Take by mouth. probiogen probiotic daily  . topiramate (TOPAMAX) 200 MG tablet Take 200 mg by mouth daily.  Marland Kitchen zinc oxide 20 % ointment Apply 1 application topically 2 (two) times daily as needed for irritation.   Allergies  Allergen Reactions  . Cephalexin Swelling and Rash  . Hctz [Hydrochlorothiazide] Rash  and Swelling    Mild she says  . Lisinopril Hives and Other (See Comments)    Headache, Low BP, angioedema    . Losartan Hives and Swelling    Angioedema  . Penicillins Rash    Has patient had a PCN reaction causing immediate rash, facial/tongue/throat swelling, SOB or lightheadedness with hypotension: Yes Has patient had a PCN reaction causing severe rash involving mucus membranes or skin necrosis: No Has patient had a PCN reaction that required hospitalization: No Has patient had a PCN reaction occurring within the last 10 years: No If all of the above answers are "NO", then may proceed with Cephalosporin use.   . Bee Venom     Swelling   . Metformin And Related     Rash    Recent Results (from the past 2160 hour(s))  POCT Wet Prep with KOH     Status: Normal   Collection Time: 01/23/21  2:53 PM  Result Value Ref Range   Trichomonas, UA Negative    Clue Cells Wet Prep HPF POC neg    Epithelial Wet Prep HPF POC     Yeast Wet Prep HPF POC neg    Bacteria Wet Prep HPF POC     RBC Wet Prep HPF POC     WBC Wet Prep HPF POC     KOH Prep POC Negative Negative   Objective  Body mass index is 44.11 kg/m. Wt Readings from Last 3 Encounters:  02/12/21 249 lb (112.9 kg)  01/29/21 244 lb 4.8 oz (110.8 kg)  01/23/21 247 lb (112 kg)   Temp Readings from Last 3 Encounters:  02/12/21 98 F (36.7 C) (Oral)  01/29/21 98.3 F (36.8 C) (Oral)  01/04/21 98.3 F (36.8 C) (Oral)   BP Readings from Last 3 Encounters:  02/12/21 124/70  01/29/21  126/80  01/23/21 126/80   Pulse Readings from Last 3 Encounters:  02/12/21 81  01/29/21 71  01/04/21 74    Physical Exam Constitutional:      Appearance: Normal appearance. She is well-developed and well-groomed.  HENT:     Head: Normocephalic and atraumatic.  Eyes:     Conjunctiva/sclera: Conjunctivae normal.     Pupils: Pupils are equal, round, and reactive to light.  Cardiovascular:     Rate and Rhythm: Normal rate and regular  rhythm.     Heart sounds: Normal heart sounds. No murmur heard.   Pulmonary:     Effort: Pulmonary effort is normal.     Breath sounds: Normal breath sounds.  Abdominal:     Tenderness: There is no abdominal tenderness.  Genitourinary:    Comments: Stool in rectum/anus  Skin:    General: Skin is warm and dry.  Neurological:     General: No focal deficit present.     Mental Status: She is alert and oriented to person, place, and time. Mental status is at baseline.     Gait: Gait normal.  Psychiatric:        Attention and Perception: Attention and perception normal.        Mood and Affect: Mood and affect normal.        Speech: Speech normal.        Behavior: Behavior normal. Behavior is cooperative.        Thought Content: Thought content normal.        Cognition and Memory: Cognition and memory normal.        Judgment: Judgment normal.     Assessment  Plan  Incontinence of feces, unspecified fecal incontinence type - Plan: MR Lumbar Spine Wo Contrast, Ambulatory referral to Gastroenterology Dr. Vicente Males  Lumbar radiculopathy - Plan: MR Lumbar Spine Wo Contrast Mid back pain - Plan: DG Thoracic Spine 2 View Abnormal MRI, lumbar spine - Plan: MR Lumbar Spine Wo Contrast, DG Thoracic Spine 2 View   Left lower abdomen intertrigo improved  Gold bond talc free powder in creases  Call back if creams needed clotrimazole, hydrocortison 2.5 % cream and bactroban   HM HM Declines flu shotallergic Tdap utd due 7/13/2020will do in future at f/u [place] 2/2 consider boosterask allergy about this Allergic to pna vaccine Consider shingrix future Consider shingrix but given allergies use caution Not immuneMMR, hep B titer in futureuse caution though with h/o allergies with all vaccines 915/21 house calls visit A1c 6.1  h/o supracervical hysterectomy ([facility]), pap 02/29/16 negative no comment HPV, another pap 06/23/17 neg pap neg HPV  -h/o LEEP 2006  mammo 08/04/19  negativeordered11/17/21 negativeordered 2022  Colonoscopy7/10/20 normal f/u in 10 years egd sch 10/23/20 Dr. Joycie Peek bxs 10/23/20 aperistalsis and food +consider gastric emptying study and esophageal mamometry 10/25/20 Dr. Bailey Mech Dr Jacklynn Lewis,   I will see her next Wednesday 12/01/20 at 8:15 am- march I agree is way too long , I will order the tests at her office visit after I discuss about it with her .Thank you for reaching out to me.   Yes there has been some case reports of Victoza causing GI tract dysmotility - is holding the medication for a while an option?    Echo Dr.Kowalski  -nl LV function EF 60% moderate mitral insuff and mild tricuspid insufficiency treadmill stress test neg 07/30/13 -h/o PVCS  Hep C neg 08/17/20 HIV neg 02/04/16  Ob/gyn appt pending bladder leakage I.e bladder sling vs [place]  in PT(stopped PT w/o help worsen sneezing or coughing) -Dr. Georgianne Fick pt wants surgeryas of 04/20/20 sx's better and no surgery indicated  Consider referral repeat sleep study h/o OSA Provider: Dr. Olivia Mackie McLean-Scocuzza-Internal Medicine

## 2021-02-12 NOTE — Patient Instructions (Addendum)
[facility] in [place] supply   Gold bond talc free powder in creases  Call back if creams needed clotrimazole, hydrocortison 2.5 % cream and bactroban   Fecal Incontinence Fecal incontinence, also called accidental bowel leakage, is not being able to control your bowels. This condition happens because the nerves or muscles around the anus do not work the way they should. This affects their ability to hold stool (feces). What are the causes? This condition may be caused by:  Damage to the muscles at the end of the rectum (sphincter).  Damage to the nerves that control bowel movements.  Diarrhea.  Chronic constipation.  Pelvic floor dysfunction. This means the muscles in the pelvis do not work well.  Loss of bowel storage capacity. This occurs when the rectum can no longer stretch in size in order to store feces.  Inflammatory bowel disease (IBD), such as Crohn's disease.  Irritable bowel syndrome (IBS). What increases the risk? You are more likely to develop this condition if you:  Were born with bowels or a pelvis that did not form correctly.  Have had rectal surgery.  Have had radiation treatment for certain cancers.  Have been pregnant, had a vaginal delivery, or had surgery that damaged the pelvic floor muscles.  Had a complicated childbirth, spinal cord injury, or other trauma that caused nerve damage.  Have a condition that can affect nerve function, such as diabetes, Parkinson's disease, or multiple sclerosis.  Have a condition where the rectum drops down into the anus or vagina (prolapse).  Are 62 years of age or older. What are the signs or symptoms? The main symptom of this condition is not being able to control your bowels. You also might not be able to get to the bathroom before a bowel movement. How is this diagnosed? This condition is diagnosed with a medical history and physical exam. You may also have other tests,  including:  Blood tests.  Urine tests.  A rectal exam.  Ultrasound.  MRI.  Colonoscopy. This is an exam that looks at your large intestine (colon).  Anal manometry. This is a test that measures the strength of the anal sphincter.  Anal electromyogram (EMG). This is a test that uses small electrodes to check for nerve damage. How is this treated? Treatment for this condition depends on the cause and severity. Treatment may also focus on addressing any underlying causes of this condition. Treatment may include:  Medicines. This may include medicines to: ? Prevent diarrhea. ? Help with constipation (bulk-forming laxatives). ? Treat any underlying conditions.  Biofeedback therapy. This can help to retrain muscles that are affected.  Fiber supplements. These can help manage your bowel movements.  Nerve stimulation.  Injectable gel to promote tissue growth and better muscle control.  Surgery. You may need: ? Sphincter repair surgery. ? Diversion surgery. This procedure lets feces pass out of your body through a hole in your abdomen. Follow these instructions at home: Eating and drinking  Follow instructions from your health care provider about any eating or drinking restrictions. ? Work with a dietitian to come up with a healthy diet that will help you avoid the foods that can make your condition worse. ? Keep a diet diary to find out which foods or drinks could be making your condition worse.  Drink enough fluid to keep your urine pale yellow.   Lifestyle  Do not use any products that contain nicotine or tobacco, such as cigarettes and e-cigarettes. If you  need help quitting, ask your health care provider. This may help your condition.  If you are overweight, talk with your health care provider about how to safely lose weight. This may help your condition.  Increase your physical activity as told by your health care provider. This may help your condition. Always talk with  your health care provider before starting a new exercise program.  Carry a change of clothes and supplies to clean up quickly if you have an episode of fetal incontinence.  Consider joining a fecal incontinence support group. You can find a support group online or in your local community. General instructions  Take over-the-counter and prescription medicines only as told by your health care provider. This includes any supplements.  Apply a moisture barrier, such as petroleum jelly, to your rectum. This protects the skin from irritation caused by ongoing leaking or diarrhea.  Tell your health care provider if you are upset or depressed about your condition.  Keep all follow-up visits as told by your health care provider. This is important.   Where to find more information  International Foundation for Functional Gastrointestinal Disorders: iffgd.[place] of Gastroenterology: patients.gi.org Contact a health care provider if:  You have a fever.  You have redness, swelling, or pain around your rectum.  Your pain is getting worse or you lose feeling in your rectal area.  You have blood in your stool.  You feel sad or hopeless.  You avoid social or work situations. Get help right away if:  You stop having bowel movements.  You cannot eat or drink without vomiting.  You have rectal bleeding that does not stop.  You have severe pain that is getting worse.  You have symptoms of dehydration, including: ? Sleepiness or fatigue. ? Producing little or no urine, tears, or sweat. ? Dizziness. ? Dry mouth. ? Unusual irritability. ? Headache. ? Inability to think clearly. Summary  Fecal incontinence, also called accidental bowel leakage, is not being able to control your bowels. This condition happens because the nerves or muscles around the anus do not work the way they should.  Treatment varies depending on the cause and severity of your condition. Treatment may  also focus on addressing any underlying causes of this condition.  Follow instructions from your health care provider about any eating or drinking restrictions, lifestyle changes, and skin care.  Take over-the-counter and prescription medicines only as told by your health care provider. This includes any supplements.  Tell your health care provider if your symptoms worsen or if you are upset or depressed about your condition. This information is not intended to replace advice given to you by your health care provider. Make sure you discuss any questions you have with your health care provider. Document Revised: 02/04/2018 Document Reviewed: 02/04/2018 Elsevier Patient Education  2021 [employer].

## 2021-02-12 NOTE — Telephone Encounter (Signed)
Seen 02/12/21 no need for antibiotics left abdomen intertrigo improved rec gold bond talc  free powder  Dr. Olivia Mackie McLean-Scocuzza

## 2021-02-13 ENCOUNTER — Telehealth: Payer: Self-pay

## 2021-02-13 NOTE — Chronic Care Management (AMB) (Signed)
  Chronic Care Management   Note  02/13/2021 Name: INDIYAH PAONE MRN: [id number] DOB: Mar 11, 1966  [name] is a 55 y.o. year old female who is a primary care patient of McLean-Scocuzza, Nino Glow, MD. JANNELLY BERGREN is currently enrolled in care management services. An additional referral for RN CM  was placed.   Follow up plan: Telephone appointment with care management team member scheduled for:02/25/2021  Noreene Larsson, [facility], [place], [place] [address] Direct Dial: [phone] Carder [email or web address]: [email or web address]

## 2021-02-13 NOTE — Progress Notes (Signed)
Patient has been scheduled

## 2021-02-14 ENCOUNTER — Encounter: Payer: Self-pay | Admitting: Gastroenterology

## 2021-02-14 ENCOUNTER — Ambulatory Visit (INDEPENDENT_AMBULATORY_CARE_PROVIDER_SITE_OTHER): Payer: Medicare Other | Admitting: Gastroenterology

## 2021-02-14 ENCOUNTER — Other Ambulatory Visit: Payer: Self-pay

## 2021-02-14 VITALS — BP 122/78 | HR 73 | Ht 63.0 in | Wt 244.0 lb

## 2021-02-14 DIAGNOSIS — L508 Other urticaria: Principal | ICD-10-CM

## 2021-02-14 DIAGNOSIS — T783XXA Angioneurotic edema, initial encounter: Principal | ICD-10-CM

## 2021-02-14 DIAGNOSIS — R159 Full incontinence of feces: Secondary | ICD-10-CM

## 2021-02-14 MED ORDER — HYDROXYCHLOROQUINE 200 MG TABLET
ORAL_TABLET | Freq: Two times a day (BID) | ORAL | 12 refills | 30 days | Status: CP
Start: 2021-02-14 — End: ?

## 2021-02-14 NOTE — Progress Notes (Signed)
Jonathon Bellows MD, MRCP(U.K) [address]  [place]  [place], [place] [address]  Main: [phone]  Fax: [phone]   Primary Care Physician: McLean-Scocuzza, Nino Glow, MD  Primary Gastroenterologist:  Dr. Jonathon Bellows   C/c :  Fecal incontinence  HPI: Heather Sweeney is a 55 y.o. female     Summary of history :   Seen on 10/31/2020 for dysphagia which has beengoing on for many months likely due to dysmotility from GERD and use of oxycodone, improved after stopping victoza suggesting possible gastroparesis and reflux,  history of heartburn which she uses Tums as needed. She uses snuff which she puts underneath her lower lip for many years. Her father had a history of esophageal cancer. He was a non-smoker. The patient denies any weight loss. Liquids are not affected. Feels like a knot in the center of her chest.  04/15/2019: colonoscopy :normal 10/26/2020: EGD: The stomach and duodenum appeared normal. There was significantly impaired motility of the esophagus. There was residual food debris in the lumen. Biopsies esophagus showed no evidence of eosinophils.  Interval history3/22/2022-02/14/2021  Her main calmplaints today is that she has foul discharge from her anus, denies any constipation, denies any diarrhea.  States that stains her undergarments.  No rectal bleeding.   Current Outpatient Medications  Medication Sig Dispense Refill  . amLODipine (NORVASC) 5 MG tablet Take 1 tablet (5 mg total) by mouth daily. 90 tablet 3  . benztropine (COGENTIN) 1 MG tablet Take 1 mg by mouth 2 (two) times daily.    . Biotin w/ Vitamins C & E (HAIR/SKIN/NAILS PO) Take 2,500 mcg by mouth daily.    . Blood Glucose Calibration (TRUE METRIX LEVEL 1) Low SOLN     . cetirizine (ZYRTEC ALLERGY) 10 MG tablet Take 2 tablets (20 mg total) by mouth daily. 30 tablet 0  . clobetasol cream (TEMOVATE) 2.22 % Apply 1 application topically 2 (two) times daily.    . Clotrimazole 1 % OINT  Apply 1 application topically in the morning and at bedtime. 60 g 2  . clotrimazole-betamethasone (LOTRISONE) cream Apply externally BID prn sx up to 2 wks 15 g 0  . famotidine (PEPCID) 40 MG tablet Take 1 tablet (40 mg total) by mouth daily. 90 tablet 1  . fexofenadine (ALLEGRA) 180 MG tablet Take 2 tablets by mouth every morning.    Marland Kitchen FIBER ADULT GUMMIES PO Take by mouth.    . fluconazole (DIFLUCAN) 150 MG tablet Take 1 tablet (150 mg total) by mouth once a week. X 2-4 weeks 4 tablet 0  . FLUoxetine (PROZAC) 20 MG tablet Take 20 mg by mouth daily.    . fluPHENAZine (PROLIXIN) 2.5 MG tablet Take 1 tablet (2.5 mg total) by mouth at bedtime. (Patient taking differently: Take 2.5 mg by mouth at bedtime. Take with 5 mg to equal 7.5 mg)    . fluPHENAZine (PROLIXIN) 5 MG tablet Take 5 mg by mouth daily. Take with 2.5 mg to equal 7.5 mg    . fluticasone (FLONASE) 50 MCG/ACT nasal spray Place 2 sprays into both nostrils daily. 48 g 3  . hydrocortisone 2.5 % ointment Apply topically 2 (two) times daily. Left lower abdomen 30 g 2  . hydrOXYzine (ATARAX/VISTARIL) 25 MG tablet Take 1 tablet (25 mg total) by mouth every 8 (eight) hours as needed. 90 tablet 3  . Melatonin 10 MG TABS Take by mouth at bedtime.    . meloxicam (MOBIC) 15 MG tablet Take 15 mg  by mouth daily.    . metoprolol succinate (TOPROL-XL) 25 MG 24 hr tablet Take 1 tablet (25 mg total) by mouth daily. Take with or immediately following a meal. 90 tablet 3  . montelukast (SINGULAIR) 10 MG tablet Take 1 tablet (10 mg total) by mouth daily. 90 tablet 1  . Multiple Vitamins-Minerals (ALIVE MULTI-VITAMIN PO) Take by mouth daily in the afternoon.    . mupirocin ointment (BACTROBAN) 2 % Apply 1 application topically 3 (three) times daily. Scalp 22 g 0  . mupirocin ointment (BACTROBAN) 2 % Apply 1 application topically 2 (two) times daily. 30 g 2  . omalizumab (XOLAIR) 150 MG/ML prefilled syringe Inject 300 mg into the skin every 14 (fourteen) days.     Marland Kitchen oxyCODONE-acetaminophen (PERCOCET) 10-325 MG tablet Take 1 tablet by mouth 3 (three) times daily as needed.    . Probiotic Product (PROBIOTIC DAILY PO) Take by mouth. probiogen probiotic daily    . topiramate (TOPAMAX) 200 MG tablet Take 200 mg by mouth daily.    Marland Kitchen zinc oxide 20 % ointment Apply 1 application topically 2 (two) times daily as needed for irritation.    Marland Kitchen EPINEPHrine 0.3 mg/0.3 mL IJ SOAJ injection Inject 0.3 mg into the muscle as needed for anaphylaxis. 2 each 3   No current facility-administered medications for this visit.    Allergies as of 02/14/2021 - Review Complete 02/14/2021  Allergen Reaction Noted  . Cephalexin Swelling and Rash 11/01/2015  . Hctz [hydrochlorothiazide] Rash and Swelling 11/01/2015  . Lisinopril Hives and Other (See Comments) 11/01/2015  . Losartan Hives and Swelling 07/08/2017  . Penicillins Rash 11/01/2015  . Bee venom  12/27/2018  . Metformin and related  02/14/2019     ROS:  General: Negative for anorexia, weight loss, fever, chills, fatigue, weakness. ENT: Negative for hoarseness, difficulty swallowing , nasal congestion. CV: Negative for chest pain, angina, palpitations, dyspnea on exertion, peripheral edema.  Respiratory: Negative for dyspnea at rest, dyspnea on exertion, cough, sputum, wheezing.  GI: See history of present illness. GU:  Negative for dysuria, hematuria, urinary incontinence, urinary frequency, nocturnal urination.  Endo: Negative for unusual weight change.    Physical Examination:   BP 122/78 (BP Location: Left Arm, Patient Position: Sitting, Cuff Size: Large)   Pulse 73   Ht 5\' 3"  (1.6 m)   Wt 244 lb (110.7 kg)   BMI 43.22 kg/m   General: Well-nourished, well-developed in no acute distress.  Eyes: No icterus. Conjunctivae pink. Mouth: Oropharyngeal mucosa moist and pink , no lesions erythema or exudate. Extremities: No lower extremity edema. No clubbing or deformities. Neuro: Alert and oriented x 3.   Grossly intact. Skin: Warm and dry, no jaundice.   Psych: Alert and cooperative, normal mood and affect.   Imaging Studies: No results found.  Assessment and Plan:   KABREA SEENEY is a 55 y.o. y/o female here with complaint of foul discharge per rectum.  Denies any constipation or diarrhea.  No rectal bleeding.  Colonoscopy in 2020 showed no gross abnormality.  Plan 1.    Suggest unsedated flexible sigmoidoscopy to examine the perianal area, rectum for any obstructing lesions or sinus tracts.  Dr Jonathon Bellows  MD,MRCP [facility]) Follow up in as needed

## 2021-02-15 ENCOUNTER — Ambulatory Visit: Payer: Medicare Other | Admitting: Registered Nurse

## 2021-02-15 ENCOUNTER — Other Ambulatory Visit: Payer: Self-pay

## 2021-02-15 ENCOUNTER — Encounter: Payer: Self-pay | Admitting: Gastroenterology

## 2021-02-15 ENCOUNTER — Ambulatory Visit
Admission: RE | Admit: 2021-02-15 | Discharge: 2021-02-15 | Disposition: A | Discharge status: Home / Self Care | Payer: Medicare Other | Attending: Gastroenterology | Admitting: Gastroenterology

## 2021-02-15 ENCOUNTER — Encounter
Admission: RE | Disposition: A | Discharge status: Home / Self Care | Payer: Self-pay | Source: Home / Self Care | Attending: Gastroenterology

## 2021-02-15 DIAGNOSIS — R195 Other fecal abnormalities: Secondary | ICD-10-CM | POA: Diagnosis not present

## 2021-02-15 DIAGNOSIS — Z888 Allergy status to other drugs, medicaments and biological substances status: Secondary | ICD-10-CM | POA: Insufficient documentation

## 2021-02-15 DIAGNOSIS — Z79899 Other long term (current) drug therapy: Secondary | ICD-10-CM | POA: Insufficient documentation

## 2021-02-15 DIAGNOSIS — R159 Full incontinence of feces: Secondary | ICD-10-CM

## 2021-02-15 DIAGNOSIS — Z881 Allergy status to other antibiotic agents status: Secondary | ICD-10-CM | POA: Insufficient documentation

## 2021-02-15 DIAGNOSIS — Z880 Allergy status to penicillin: Secondary | ICD-10-CM | POA: Insufficient documentation

## 2021-02-15 HISTORY — PX: FLEXIBLE SIGMOIDOSCOPY: SHX5431

## 2021-02-15 HISTORY — DX: Unspecified asthma, uncomplicated: J45.909

## 2021-02-15 SURGERY — SIGMOIDOSCOPY, FLEXIBLE

## 2021-02-15 MED ORDER — SODIUM CHLORIDE 0.9 % IV SOLN: INTRAVENOUS | Status: DC

## 2021-02-15 MED ORDER — PROPOFOL 500 MG/50ML IV EMUL
INTRAVENOUS | Status: AC
  Filled 2021-02-15: qty 50

## 2021-02-15 MED ORDER — LIDOCAINE HCL (PF) 2 % IJ SOLN
INTRAMUSCULAR | Status: AC
  Filled 2021-02-15: qty 2

## 2021-02-15 NOTE — H&P (Signed)
Jonathon Bellows, MD [address], [place], [place], [place], [address] [address], [place], [place], [place], [address] Phone: [phone]  Fax: [phone]  Primary Care Physician:  McLean-Scocuzza, Nino Glow, MD   Pre-Procedure History & Physical: HPI:  Heather Sweeney is a 55 y.o. female is here for a flexible sigmoidoscopy   Past Medical History:  Diagnosis Date  . Allergic rhinitis   . Asthma   . Bipolar disorder ([place])   . Chronic low back pain   . CTS (carpal tunnel syndrome)    hands  . Depression   . Gallstones   . GERD (gastroesophageal reflux disease)   . History of prediabetes   . Hypertension   . Laceration of right foot   . Onychomycosis   . OSA on CPAP   . Overactive bladder   . PVC's (premature ventricular contractions)   . PVC's (premature ventricular contractions)    Dr. Raliegh Ip cards  . Seizures ([place])    8th grade    Past Surgical History:  Procedure Laterality Date  . ABDOMINAL HYSTERECTOMY  07/08/2011  . ABDOMINAL HYSTERECTOMY     supracervical in 07/2011   . CARPAL TUNNEL RELEASE     right hand 1996  . CESAREAN SECTION     2012  . CHOLECYSTECTOMY N/A 01/12/2018   Procedure: LAPAROSCOPIC CHOLECYSTECTOMY WITH INTRAOPERATIVE CHOLANGIOGRAM ERAS PATHWAY;  Surgeon: Johnathan Hausen, MD;  Location: [facility] ORS;  Service: General;  Laterality: N/A;  . COLONOSCOPY WITH PROPOFOL N/A 04/15/2019   Procedure: COLONOSCOPY WITH PROPOFOL;  Surgeon: Jonathon Bellows, MD;  Location: [facility] ENDOSCOPY;  Service: Gastroenterology;  Laterality: N/A;  . DILATION AND CURETTAGE OF UTERUS     04/2011 benign endometrial polyp squamous metaplasia, inflamm, blood, mucous, benign   . ESOPHAGOGASTRODUODENOSCOPY (EGD) WITH PROPOFOL N/A 10/23/2020   Procedure: ESOPHAGOGASTRODUODENOSCOPY (EGD) WITH PROPOFOL;  Surgeon: Jonathon Bellows, MD;  Location[facility] ENDOSCOPY;  Service: Gastroenterology;  Laterality: N/A;  . FOOT SURGERY     Dr. Prudence Davidson   . LEEP     2006  . TUBAL LIGATION      Prior to  Admission medications   Medication Sig Start Date End Date Taking? Authorizing Provider  amLODipine (NORVASC) 5 MG tablet Take 1 tablet (5 mg total) by mouth daily. 04/20/20 04/20/21 Yes McLean-Scocuzza, Nino Glow, MD  benztropine (COGENTIN) 1 MG tablet Take 1 mg by mouth 2 (two) times daily. 08/10/20  Yes [provider]  Biotin w/ Vitamins C & E (HAIR/SKIN/NAILS PO) Take 2,500 mcg by mouth daily.   Yes [provider]  Blood Glucose Calibration (TRUE METRIX LEVEL 1) Low SOLN  01/12/20  Yes [provider]  cetirizine (ZYRTEC ALLERGY) 10 MG tablet Take 2 tablets (20 mg total) by mouth daily. 10/11/20  Yes McLean-Scocuzza, Nino Glow, MD  clobetasol cream (TEMOVATE) 8.18 % Apply 1 application topically 2 (two) times daily.   Yes [provider]  Clotrimazole 1 % OINT Apply 1 application topically in the morning and at bedtime. 01/29/21  Yes McLean-Scocuzza, Nino Glow, MD  clotrimazole-betamethasone (LOTRISONE) cream Apply externally BID prn sx up to 2 wks 5/63/14  Yes Copland, Alicia B, PA-C  EPINEPHrine 0.3 mg/0.3 mL IJ SOAJ injection Inject 0.3 mg into the muscle as needed for anaphylaxis. 08/17/20  Yes McLean-Scocuzza, Nino Glow, MD  famotidine (PEPCID) 40 MG tablet Take 1 tablet (40 mg total) by mouth daily. 12/25/20  Yes Jonathon Bellows, MD  fexofenadine (ALLEGRA) 180 MG tablet Take 2 tablets by mouth  every morning. 08/07/20  Yes [provider]  FIBER ADULT GUMMIES PO Take by mouth.   Yes [provider]  fluconazole (DIFLUCAN) 150 MG tablet Take 1 tablet (150 mg total) by mouth once a week. X 2-4 weeks 01/29/21  Yes McLean-Scocuzza, Nino Glow, MD  FLUoxetine (PROZAC) 20 MG tablet Take 20 mg by mouth daily. 07/15/20  Yes [provider]  fluPHENAZine (PROLIXIN) 2.5 MG tablet Take 1 tablet (2.5 mg total) by mouth at bedtime. Patient taking differently: Take 2.5 mg by mouth at bedtime. Take with 5 mg to equal 7.5 mg 08/17/20  Yes McLean-Scocuzza, Nino Glow, MD   fluPHENAZine (PROLIXIN) 5 MG tablet Take 5 mg by mouth daily. Take with 2.5 mg to equal 7.5 mg   Yes [provider]  fluticasone (FLONASE) 50 MCG/ACT nasal spray Place 2 sprays into both nostrils daily. 10/12/20  Yes McLean-Scocuzza, Nino Glow, MD  hydrocortisone 2.5 % ointment Apply topically 2 (two) times daily. Left lower abdomen 01/29/21  Yes McLean-Scocuzza, Nino Glow, MD  hydrOXYzine (ATARAX/VISTARIL) 25 MG tablet Take 1 tablet (25 mg total) by mouth every 8 (eight) hours as needed. 07/24/20  Yes McLean-Scocuzza, Nino Glow, MD  Melatonin 10 MG TABS Take by mouth at bedtime.   Yes [provider]  meloxicam (MOBIC) 15 MG tablet Take 15 mg by mouth daily.   Yes [provider]  metoprolol succinate (TOPROL-XL) 25 MG 24 hr tablet Take 1 tablet (25 mg total) by mouth daily. Take with or immediately following a meal. 04/20/20  Yes McLean-Scocuzza, Nino Glow, MD  montelukast (SINGULAIR) 10 MG tablet Take 1 tablet (10 mg total) by mouth daily. 10/12/20  Yes McLean-Scocuzza, Nino Glow, MD  Multiple Vitamins-Minerals (ALIVE MULTI-VITAMIN PO) Take by mouth daily in the afternoon.   Yes [provider]  mupirocin ointment (BACTROBAN) 2 % Apply 1 application topically 3 (three) times daily. Scalp 08/17/20  Yes McLean-Scocuzza, Nino Glow, MD  mupirocin ointment (BACTROBAN) 2 % Apply 1 application topically 2 (two) times daily. 01/29/21  Yes McLean-Scocuzza, Nino Glow, MD  omalizumab Arvid Right) 150 MG/ML prefilled syringe Inject 300 mg into the skin every 14 (fourteen) days. 11/13/20 10/08/21 Yes [provider]  oxyCODONE-acetaminophen (PERCOCET) 10-325 MG tablet Take 1 tablet by mouth 3 (three) times daily as needed. 05/31/20  Yes [provider]  Probiotic Product (PROBIOTIC DAILY PO) Take by mouth. probiogen probiotic daily   Yes [provider]  topiramate (TOPAMAX) 200 MG tablet Take 200 mg by mouth daily. 03/12/20  Yes [provider]  zinc oxide 20 %  ointment Apply 1 application topically 2 (two) times daily as needed for irritation.   Yes [provider]    Allergies as of 02/14/2021 - Review Complete 02/14/2021  Allergen Reaction Noted  . Cephalexin Swelling and Rash 11/01/2015  . Hctz [hydrochlorothiazide] Rash and Swelling 11/01/2015  . Lisinopril Hives and Other (See Comments) 11/01/2015  . Losartan Hives and Swelling 07/08/2017  . Penicillins Rash 11/01/2015  . Bee venom  12/27/2018  . Metformin and related  02/14/2019    Family History  Problem Relation Age of Onset  . Cancer Father        esophageal   . Hypertension Father   . Heart failure Mother   . Hypertension Mother   . Early death Mother   . Heart disease Mother        chf  . Breast cancer Sister 40  . Cancer Sister  breast  . Depression Sister   . Mental illness Sister   . Asthma Son   . Cancer Other        brain cancer    Social History   Socioeconomic History  . Marital status: Married    Spouse name: Not on file  . Number of children: Not on file  . Years of education: Not on file  . Highest education level: Not on file  Occupational History  . Not on file  Tobacco Use  . Smoking status: Never Smoker  . Smokeless tobacco: Current User    Types: Snuff  . Tobacco comment: Is in process of decreasing amount  Vaping Use  . Vaping Use: Never used  Substance and Sexual Activity  . Alcohol use: No    Alcohol/week: 0.0 standard drinks  . Drug use: No  . Sexual activity: Not Currently    Birth control/protection: Surgical    Comment: Hysterectomy  Other Topics Concern  . Not on file  Social History Narrative   Married    No guns    Wears seat belt    Safe in relationship    Never smoker    2 sons Gaspar Bidding ~10 and another son ~28 as of 10/2019   1 granson eli 1 y.o as of 10/2019   2 sisters    Unemployed       Social Determinants of Health   Financial Resource Strain: Low Risk   . Difficulty of Paying Living Expenses:  Not hard at all  Food Insecurity: No Food Insecurity  . Worried About Charity fundraiser in the Last Year: Never true  . Ran Out of Food in the Last Year: Never true  Transportation Needs: No Transportation Needs  . Lack of Transportation (Medical): No  . Lack of Transportation (Non-Medical): No  Physical Activity: Not on file  Stress: No Stress Concern Present  . Feeling of Stress : Not at all  Social Connections: Unknown  . Frequency of Communication with Friends and Family: Not on file  . Frequency of Social Gatherings with Friends and Family: Not on file  . Attends Religious Services: Not on file  . Active Member of Clubs or Organizations: Not on file  . Attends Archivist Meetings: Not on file  . Marital Status: Married  Human resources officer Violence: Not At Risk  . Fear of Current or Ex-Partner: No  . Emotionally Abused: No  . Physically Abused: No  . Sexually Abused: No    Review of Systems: See HPI, otherwise negative ROS  Physical Exam: BP (!) 154/89   Pulse 81   Temp (!) 96.5 F (35.8 C) (Temporal)   Resp 18   Ht 5\' 3"  (1.6 m)   Wt 110.7 kg   SpO2 98%   BMI 43.22 kg/m  General:   Alert,  pleasant and cooperative in NAD Head:  Normocephalic and atraumatic. Neck:  Supple; no masses or thyromegaly. Lungs:  Clear throughout to auscultation, normal respiratory effort.    Heart:  +S1, +S2, Regular rate and rhythm, No edema. Abdomen:  Soft, nontender and nondistended. Normal bowel sounds, without guarding, and without rebound.   Neurologic:  Alert and  oriented x4;  grossly normal neurologically.  Impression/Plan: CURISSA GALUSKA is here for a flexible sigmoidoscopy to evaluate change in bowel habvits Risks, benefits, limitations, and alternatives regarding  colonoscopy have been reviewed with the patient.  Questions have been answered.  All parties agreeable.   Jonathon Bellows, MD  02/15/2021, 11:16 AM

## 2021-02-15 NOTE — Op Note (Signed)
[facility] Gastroenterology Patient Name: Heather Sweeney Procedure Date: 02/15/2021 11:17 AM MRN: [id number] Account #: [id number] Date of Birth: Apr 16, 1966 Admit Type: Outpatient Age: 55 Room: [facility] ENDO ROOM 4 Gender: Female Note Status: Finalized Procedure:             Flexible Sigmoidoscopy Indications:           Change in stool caliber Providers:             Jonathon Bellows MD, MD Medicines:             None Complications:         No immediate complications. Procedure:             Pre-Anesthesia Assessment:                        - Prior to the procedure, a History and Physical was                         performed, and patient medications, allergies and                         sensitivities were reviewed. The patient's tolerance                         of previous anesthesia was reviewed.                        - The risks and benefits of the procedure and the                         sedation options and risks were discussed with the                         patient. All questions were answered and informed                         consent was obtained.                        - ASA Grade Assessment: II - A patient with mild                         systemic disease.                        After obtaining informed consent, the scope was passed                         under direct vision. The Colonoscope was introduced                         through the anus and advanced to the the left                         transverse colon. The flexible sigmoidoscopy was                         accomplished with ease. The patient tolerated the  procedure well. The quality of the bowel preparation                         was good. Findings:      The perianal and digital rectal examinations were normal.      The colon (entire examined portion) appeared normal. Impression:            - The entire examined colon is normal.                        - No  specimens collected. Recommendation:        - Discharge patient to home (with escort).                        - Use original regular Metamucil one teaspoon PO TID                         indefinitely. Procedure Code(s):     --- Professional ---                        [phone], Sigmoidoscopy, flexible; diagnostic, including                         collection of specimen(s) by brushing or washing, when                         performed (separate procedure) Diagnosis Code(s):     --- Professional ---                        R19.5, Other fecal abnormalities CPT copyright 2019 American Medical Association. All rights reserved. The codes documented in this report are preliminary and upon coder review may  be revised to meet current compliance requirements. Jonathon Bellows, MD Jonathon Bellows MD, MD 02/15/2021 11:33:38 AM This report has been signed electronically. Number of Addenda: 0 Note Initiated On: 02/15/2021 11:17 AM Total Procedure Duration: 0 hours 8 minutes 42 seconds  Estimated Blood Loss:  Estimated blood loss: none.      [facility]

## 2021-02-18 ENCOUNTER — Encounter: Payer: Self-pay | Admitting: Gastroenterology

## 2021-02-19 ENCOUNTER — Ambulatory Visit: Payer: Medicare Other | Admitting: Internal Medicine

## 2021-02-19 ENCOUNTER — Encounter: Admit: 2021-02-19 | Discharge: 2021-02-20 | Payer: MEDICARE

## 2021-02-19 DIAGNOSIS — L818 Other specified disorders of pigmentation: Principal | ICD-10-CM

## 2021-02-19 DIAGNOSIS — L508 Other urticaria: Principal | ICD-10-CM

## 2021-02-19 MED ORDER — TRIAMCINOLONE ACETONIDE 0.1 % TOPICAL OINTMENT
INTRAMUSCULAR | 6 refills | 0.00000 days | Status: CP
Start: 2021-02-19 — End: ?

## 2021-02-21 ENCOUNTER — Telehealth: Payer: Self-pay | Admitting: Internal Medicine

## 2021-02-21 NOTE — Telephone Encounter (Signed)
PT called in wanting to speak to [place] in regards to her leg being swollen

## 2021-02-21 NOTE — Telephone Encounter (Signed)
PT called in again to inform that everything she stated should be disregarded as she is going to the Dr that was refer to tomorrow by [facility]

## 2021-02-21 NOTE — Telephone Encounter (Signed)
Nothing further needed, Patient was able to contact and be seen by her specialist

## 2021-02-21 NOTE — Telephone Encounter (Signed)
Error open

## 2021-02-21 NOTE — Telephone Encounter (Signed)
What is going on is she ok?

## 2021-02-21 NOTE — Telephone Encounter (Signed)
Patient states the leg swelling is ongoing now including swelling in the foot. This is the right leg and foot.  Wanting to know if she needs to take the leg brace off or keep wearing this.  She tried to ask specialty but no one has called her back.  States she takes the Meloxicam as needed 15 mg.   Please advise

## 2021-02-25 ENCOUNTER — Encounter: Payer: Self-pay | Admitting: *Deleted

## 2021-02-25 ENCOUNTER — Ambulatory Visit: Payer: Medicare Other | Admitting: *Deleted

## 2021-02-25 ENCOUNTER — Ambulatory Visit: Admit: 2021-02-25 | Payer: MEDICARE

## 2021-02-25 DIAGNOSIS — M1711 Unilateral primary osteoarthritis, right knee: Secondary | ICD-10-CM

## 2021-02-25 DIAGNOSIS — I10 Essential (primary) hypertension: Secondary | ICD-10-CM

## 2021-02-25 DIAGNOSIS — K220 Achalasia of cardia: Secondary | ICD-10-CM

## 2021-02-26 NOTE — Chronic Care Management (AMB) (Signed)
Chronic Care Management   CCM RN Visit Note  02/26/2021 Name: Heather Sweeney MRN: [id number] DOB: 10-23-1965  Subjective: Heather Sweeney is a 55 y.o. year old female who is a primary care patient of McLean-Scocuzza, Nino Glow, MD. The care management team was consulted for assistance with disease management and care coordination needs.    Engaged with patient by telephone for initial visit in response to provider referral for case management and/or care coordination services.   Consent to Services:  The patient was given the following information about Chronic Care Management services today, agreed to services, and gave verbal consent: 1. CCM service includes personalized support from designated clinical staff supervised by the primary care provider, including individualized plan of care and coordination with other care providers 2. 24/7 contact phone numbers for assistance for urgent and routine care needs. 3. Service will only be billed when office clinical staff spend 20 minutes or more in a month to coordinate care. 4. Only one practitioner may furnish and bill the service in a calendar month. 5.The patient may stop CCM services at any time (effective at the end of the month) by phone call to the office staff. 6. The patient will be responsible for cost sharing (co-pay) of up to 20% of the service fee (after annual deductible is met). Patient agreed to services and consent obtained.  Patient agreed to services and verbal consent obtained.   Assessment: Review of patient past medical history, allergies, medications, health status, including review of consultants reports, laboratory and other test data, was performed as part of comprehensive evaluation and provision of chronic care management services.   SDOH (Social Determinants of Health) assessments and interventions performed:  SDOH Interventions   Flowsheet Row Most Recent Value  SDOH Interventions   Food Insecurity Interventions  Intervention Not Indicated  Financial Strain Interventions Intervention Not Indicated  Housing Interventions Intervention Not Indicated  Intimate Partner Violence Interventions Intervention Not Indicated  Transportation Interventions Intervention Not Indicated       CCM Care Plan  Allergies  Allergen Reactions  . Cephalexin Swelling and Rash  . Hctz [Hydrochlorothiazide] Rash and Swelling    Mild she says  . Lisinopril Hives and Other (See Comments)    Headache, Low BP, angioedema    . Losartan Hives and Swelling    Angioedema  . Penicillins Rash    Has patient had a PCN reaction causing immediate rash, facial/tongue/throat swelling, SOB or lightheadedness with hypotension: Yes Has patient had a PCN reaction causing severe rash involving mucus membranes or skin necrosis: No Has patient had a PCN reaction that required hospitalization: No Has patient had a PCN reaction occurring within the last 10 years: No If all of the above answers are "NO", then may proceed with Cephalosporin use.   . Bee Venom     Swelling   . Metformin And Related     Rash     Outpatient Encounter Medications as of 02/25/2021  Medication Sig Note  . amLODipine (NORVASC) 5 MG tablet Take 1 tablet (5 mg total) by mouth daily.   . benztropine (COGENTIN) 1 MG tablet Take 1 mg by mouth 2 (two) times daily.   . Biotin w/ Vitamins C & E (HAIR/SKIN/NAILS PO) Take 2,500 mcg by mouth daily.   . cetirizine (ZYRTEC ALLERGY) 10 MG tablet Take 2 tablets (20 mg total) by mouth daily.   . clobetasol cream (TEMOVATE) 7.59 % Apply 1 application topically 2 (two) times daily.   . Clotrimazole  1 % OINT Apply 1 application topically in the morning and at bedtime.   . diphenhydrAMINE (BENADRYL) 25 MG tablet Take 25 mg by mouth every 6 (six) hours as needed.   . famotidine (PEPCID) 40 MG tablet Take 1 tablet (40 mg total) by mouth daily.   . fexofenadine (ALLEGRA) 180 MG tablet Take 2 tablets by mouth every morning.   Marland Kitchen  FIBER ADULT GUMMIES PO Take by mouth.   Marland Kitchen FLUoxetine (PROZAC) 20 MG tablet Take 20 mg by mouth daily.   . fluPHENAZine (PROLIXIN) 2.5 MG tablet Take 1 tablet (2.5 mg total) by mouth at bedtime. (Patient taking differently: Take 2.5 mg by mouth at bedtime. Take with 5 mg to equal 7.5 mg)   . fluPHENAZine (PROLIXIN) 5 MG tablet Take 5 mg by mouth daily. Take with 2.5 mg to equal 7.5 mg   . fluticasone (FLONASE) 50 MCG/ACT nasal spray Place 2 sprays into both nostrils daily.   . hydrocortisone 2.5 % ointment Apply topically 2 (two) times daily. Left lower abdomen   . hydrOXYzine (ATARAX/VISTARIL) 25 MG tablet Take 1 tablet (25 mg total) by mouth every 8 (eight) hours as needed.   . Melatonin 10 MG TABS Take by mouth at bedtime.   . meloxicam (MOBIC) 15 MG tablet Take 15 mg by mouth daily.   . metoprolol succinate (TOPROL-XL) 25 MG 24 hr tablet Take 1 tablet (25 mg total) by mouth daily. Take with or immediately following a meal.   . montelukast (SINGULAIR) 10 MG tablet Take 1 tablet (10 mg total) by mouth daily.   . Multiple Vitamins-Minerals (ALIVE MULTI-VITAMIN PO) Take by mouth daily in the afternoon.   Marland Kitchen omalizumab Arvid Right) 150 MG/ML prefilled syringe Inject 300 mg into the skin every 14 (fourteen) days.   Marland Kitchen oxyCODONE-acetaminophen (PERCOCET) 10-325 MG tablet Take 1 tablet by mouth 3 (three) times daily as needed.   . Probiotic Product (PROBIOTIC DAILY PO) Take by mouth. probiogen probiotic daily   . topiramate (TOPAMAX) 200 MG tablet Take 200 mg by mouth daily.   Marland Kitchen zinc oxide 20 % ointment Apply 1 application topically 2 (two) times daily as needed for irritation.   . Blood Glucose Calibration (TRUE METRIX LEVEL 1) Low SOLN    . clotrimazole-betamethasone (LOTRISONE) cream Apply externally BID prn sx up to 2 wks (Patient not taking: Reported on 02/25/2021) 02/25/2021: Completed 2 weeks of treatment  . EPINEPHrine 0.3 mg/0.3 mL IJ SOAJ injection Inject 0.3 mg into the muscle as needed for  anaphylaxis.   . fluconazole (DIFLUCAN) 150 MG tablet Take 1 tablet (150 mg total) by mouth once a week. X 2-4 weeks (Patient not taking: Reported on 02/25/2021) 02/25/2021: Completed course  . mupirocin ointment (BACTROBAN) 2 % Apply 1 application topically 3 (three) times daily. Scalp (Patient not taking: Reported on 02/25/2021)   . mupirocin ointment (BACTROBAN) 2 % Apply 1 application topically 2 (two) times daily.    No facility-administered encounter medications on file as of 02/25/2021.    Patient Active Problem List   Diagnosis Date Noted  . Abnormal MRI, lumbar spine 02/12/2021  . Incontinence of feces 02/12/2021  . Intertrigo 02/12/2021  . Arthritis of right knee 10/25/2020  . Aperistalsis, esophagus 10/25/2020  . Insomnia 10/11/2020  . Elevated liver enzymes 08/17/2020  . Prediabetes 07/24/2020  . Arthritis of both knees 06/08/2020  . Annual physical exam 04/20/2020  . Ingrown right big toenail 04/20/2020  . Gastroesophageal reflux disease 04/20/2020  . Pain and swelling of  right knee 04/20/2020  . Mitral valve insufficiency 01/10/2020  . Bacterial vaginosis 10/20/2019  . Encounter for screening colonoscopy   . Hives 02/23/2019  . Urticaria 02/23/2019  . Vitamin D deficiency 02/01/2019  . Mood disorder ([place]) 12/24/2018  . HLD (hyperlipidemia) 09/01/2018  . History of prediabetes 09/01/2018  . Chronic pain 08/31/2018  . Allergic rhinitis 08/31/2018  . Lumbosacral radiculitis 02/19/2018  . S/P laparoscopic cholecystectomy April 2019 01/12/2018  . Angioedema 07/07/2017  . Bilateral carpal tunnel syndrome 11/03/2016  . Frequent PVCs 09/24/2016  . Morbid obesity with BMI of 40.0-44.9, adult ([place]) 08/07/2016  . Essential hypertension 07/10/2016  . OSA on CPAP 07/10/2016    Conditions to be addressed/monitored:HTN and Decreased Nutrition  Care Plan : Wellness (Adult)  Updates made by Leona Singleton, RN since 02/26/2021 12:00 AM  Problem: Healthy Nutrition (Wellness)    Priority: Medium  Long-Range Goal: Patient will report eating atleast 2-3 meals a day in the next 180 days   Start Date: 02/25/2021  Expected End Date: 09/04/2021  Priority: Medium  Current Barriers:   Ineffective Self Health Maintenance; patient reporting only eating about one meal a day due to decrease in appetite.  Has history of Aperistalsis Esophagus with difficulties swallowing pills and some foods, but patient stating this is not the reason she does not eat much.  States she just does not have an appetite.  Admits she does have to crush her medications to swallow them. Clinical Goal(s):  Marland Kitchen Collaboration with McLean-Scocuzza, Nino Glow, MD regarding development and update of comprehensive plan of care as evidenced by provider attestation and co-signature . Inter-disciplinary care team collaboration (see longitudinal plan of care)  patient will work with care management team to address care coordination and chronic disease management needs related to Disease Management and Educational Needs   Interventions:   Evaluation of current treatment plan related to  decreased nutrition/Aperistalsis Esophagus ,  due to decreased appetite and knowledge deficit related to adequate nutrition  self-management and patient's adherence to plan as established by provider.  Collaboration with McLean-Scocuzza, Nino Glow, MD regarding development and update of comprehensive plan of care as evidenced by provider attestation       and co-signature  Inter-disciplinary care team collaboration (see longitudinal plan of care)  Discussed plans with patient for ongoing care management follow up and provided patient with direct contact information for care management team  Reviewed current dietary intake and exercise levels and encouraged small steps toward making change to eating and exercising increasing activity as tolerated  Encouraged patient to discuss decreased appetite and meal intake with  providers  Discussed with patient need to eat at least 3 meals a day or 5-6 small meals a day for adequate nutrition and appropriate metabolism  Incremental change encouraged  Making healthy food choices encouraged  Confirmed with patient, history of aperistalsis esophagus not the reason for decreased nutrition; per patient it is not, just decreased in appetite  Sending EMMI video on nutrition and diabetes  Discussed importance of nutrition related to health  Reviewed and encouraged healthier meal and drink options Patient Goals/Self Care Activities: . Fill half of plate with vegetables . Keep a food diary . Set a realistic goal  . Discuss decrease in appetite with PCP or stomach doctor . Eat at least 3 meals day Follow Up Plan: The care management team will reach out to the patient again over the next 30 business days.    Care Plan : Hypertension (Adult)  Updates  made by Leona Singleton, RN since 02/26/2021 12:00 AM  Problem: Hypertension (Hypertension)   Priority: Medium  Long-Range Goal: Hypertension Monitored   Start Date: 02/25/2021  Expected End Date: 09/04/2021  Priority: Medium  Objective:  . Last practice recorded BP readings:  BP Readings from Last 3 Encounters:  02/15/21 117/77  02/14/21 122/78  02/12/21 124/70  Current Barriers:  Marland Kitchen Knowledge Deficits related to basic understanding of hypertension pathophysiology and self care management as evidenced by patient reporting blood pressures at home runs 150/70's.  Denies any hypotensive episodes or light headiness or dizziness.  Reports taking blood pressures occasionally at home. Case Manager Clinical Goal(s):  . patient will verbalize understanding of plan for hypertension management . patient will attend all scheduled medical appointments: 8/10 with PCP . patient will demonstrate improved adherence to prescribed treatment plan for hypertension as evidenced by taking all medications as prescribed, monitoring and  recording blood pressure as directed, adhering to low sodium/DASH diet Interventions:  . Collaboration with McLean-Scocuzza, Nino Glow, MD regarding development and update of comprehensive plan of care as evidenced by provider attestation and co-signature . Inter-disciplinary care team collaboration (see longitudinal plan of care) . Encouraged continued use of home blood pressure monitoring and recording in blood pressure log; include symptoms of hypotension or potential medication side effects in log . Shared overall cardiovascular risk with patient; encourage changes to lifestyle risk factors, including alcohol consumption, smoking, inadequate exercise, poor dietary habits and stress . Evaluation of current treatment plan related to hypertension self management and patient's adherence to plan as established by provider. . Provided education to patient re: stroke prevention, s/s of heart attack and stroke, DASH diet, complications of uncontrolled blood pressure . Reviewed medications with patient and discussed importance of compliance . Discussed plans with patient for ongoing care management follow up and provided patient with direct contact information for care management team . Advised patient, providing education and rationale, to monitor blood pressure 3 times a week and record, calling PCP for findings outside established parameters.  . Blood pressure trends reviewed . Depression screen reviewed . Home blood pressure monitoring encouraged . Encouraged to attend all scheduled provider appointments  . Encouraged to call provider office for new concerns, questions, or BP outside discussed parameters   . Discussed and encouraged to follow a low sodium diet/DASH diet . Sending education on hypertension . Sending 2022 [place] to help with logging blood pressures Patient Goals/Self-Care Activities: . Check blood pressure 3 times per week . Write blood pressure results in a log or  diary . Take log to providers office for review . Review education sent to MyChart  Follow Up Plan: The care management team will reach out to the patient again over the next 30 business days.      Plan:The care management team will reach out to the patient again over the next 30 business days.  Hubert Azure RN, MSN RN Care Management Coordinator [place] [phone] [email or web address]

## 2021-02-26 NOTE — Patient Instructions (Signed)
Visit Information  PATIENT GOALS: Goals Addressed            This Visit's Progress   . (RNCM) Eat Healthy       Timeframe:  Long-Range Goal Priority:  Medium Start Date:   02/25/21                          Expected End Date: 09/04/21                      Follow Up Date 03/25/21    . Fill half of plate with vegetables . Keep a food diary . Set a realistic goal  . Discuss decrease in appetite with PCP or stomach doctor . Eat at least 3 meals day   Why is this important?    When you are ready to manage your nutrition or weight, having a plan and setting goals will help.   Taking small steps to change how you eat and exercise is a good place to start.    Notes:     Marland Kitchen [facility]) Track and Manage My Blood Pressure-Hypertension       Timeframe:  Long-Range Goal Priority:  Medium Start Date:  02/25/21                           Expected End Date: 09/04/21                      Follow Up Date 03/25/21    . Check blood pressure 3 times per week . Write blood pressure results in a log or diary . Take log to providers office for review . Review education sent to MyChart    Why is this important?    You won't feel high blood pressure, but it can still hurt your blood vessels.   High blood pressure can cause heart or kidney problems. It can also cause a stroke.   Making lifestyle changes like losing a little weight or eating less salt will help.   Checking your blood pressure at home and at different times of the day can help to control blood pressure.   If the doctor prescribes medicine remember to take it the way the doctor ordered.   Call the office if you cannot afford the medicine or if there are questions about it.     Notes:     . COMPLETED: Follow up with Primary Care Provider         Patient verbalizes understanding of instructions provided today and agrees to view in [place].   The care management team will reach out to the patient again over the next 30 business  days.   Hubert Azure RN, MSN RN Care Management Coordinator [place] [phone] [email or web address]   Hypertension, Adult Hypertension is another name for high blood pressure. High blood pressure forces your heart to work harder to pump blood. This can cause problems over time. There are two numbers in a blood pressure reading. There is a top number (systolic) over a bottom number (diastolic). It is best to have a blood pressure that is below 120/80. Healthy choices can help lower your blood pressure, or you may need medicine to help lower it. What are the causes? The cause of this condition is not known. Some conditions may be related to high blood pressure. What increases the risk?  Smoking.  Having type 2  diabetes mellitus, high cholesterol, or both.  Not getting enough exercise or physical activity.  Being overweight.  Having too much fat, sugar, calories, or salt (sodium) in your diet.  Drinking too much alcohol.  Having long-term (chronic) kidney disease.  Having a family history of high blood pressure.  Age. Risk increases with age.  Race. You may be at higher risk if you are African American.  Gender. Men are at higher risk than women before age 74. After age 53, women are at higher risk than men.  Having obstructive sleep apnea.  Stress. What are the signs or symptoms?  High blood pressure may not cause symptoms. Very high blood pressure (hypertensive crisis) may cause: ? Headache. ? Feelings of worry or nervousness (anxiety). ? Shortness of breath. ? Nosebleed. ? A feeling of being sick to your stomach (nausea). ? Throwing up (vomiting). ? Changes in how you see. ? Very bad chest pain. ? Seizures. How is this treated?  This condition is treated by making healthy lifestyle changes, such as: ? Eating healthy foods. ? Exercising more. ? Drinking less alcohol.  Your health care provider may prescribe medicine  if lifestyle changes are not enough to get your blood pressure under control, and if: ? Your top number is above 130. ? Your bottom number is above 80.  Your personal target blood pressure may vary. Follow these instructions at home: Eating and drinking  If told, follow the DASH eating plan. To follow this plan: ? Fill one half of your plate at each meal with fruits and vegetables. ? Fill one fourth of your plate at each meal with whole grains. Whole grains include whole-wheat pasta, brown rice, and whole-grain bread. ? Eat or drink low-fat dairy products, such as skim milk or low-fat yogurt. ? Fill one fourth of your plate at each meal with low-fat (lean) proteins. Low-fat proteins include fish, chicken without skin, eggs, beans, and tofu. ? Avoid fatty meat, cured and processed meat, or chicken with skin. ? Avoid pre-made or processed food.  Eat less than 1,500 mg of salt each day.  Do not drink alcohol if: ? Your doctor tells you not to drink. ? You are pregnant, may be pregnant, or are planning to become pregnant.  If you drink alcohol: ? Limit how much you use to:  0-1 drink a day for women.  0-2 drinks a day for men. ? Be aware of how much alcohol is in your drink. In the [place] one drink equals one 12 oz bottle of beer (355 mL), one 5 oz glass of wine (148 mL), or one 1 oz glass of hard liquor (44 mL).   Lifestyle  Work with your doctor to stay at a healthy weight or to lose weight. Ask your doctor what the best weight is for you.  Get at least 30 minutes of exercise most days of the week. This may include walking, swimming, or biking.  Get at least 30 minutes of exercise that strengthens your muscles (resistance exercise) at least 3 days a week. This may include lifting weights or doing Pilates.  Do not use any products that contain nicotine or tobacco, such as cigarettes, e-cigarettes, and chewing tobacco. If you need help quitting, ask your doctor.  Check your blood  pressure at home as told by your doctor.  Keep all follow-up visits as told by your doctor. This is important.   Medicines  Take over-the-counter and prescription medicines only as told by your doctor. Follow directions  carefully.  Do not skip doses of blood pressure medicine. The medicine does not work as well if you skip doses. Skipping doses also puts you at risk for problems.  Ask your doctor about side effects or reactions to medicines that you should watch for. Contact a doctor if you:  Think you are having a reaction to the medicine you are taking.  Have headaches that keep coming back (recurring).  Feel dizzy.  Have swelling in your ankles.  Have trouble with your vision. Get help right away if you:  Get a very bad headache.  Start to feel mixed up (confused).  Feel weak or numb.  Feel faint.  Have very bad pain in your: ? Chest. ? Belly (abdomen).  Throw up more than once.  Have trouble breathing. Summary  Hypertension is another name for high blood pressure.  High blood pressure forces your heart to work harder to pump blood.  For most people, a normal blood pressure is less than 120/80.  Making healthy choices can help lower blood pressure. If your blood pressure does not get lower with healthy choices, you may need to take medicine. This information is not intended to replace advice given to you by your health care provider. Make sure you discuss any questions you have with your health care provider. Document Revised: 06/02/2018 Document Reviewed: 06/02/2018 Elsevier Patient Education  2021 [employer].

## 2021-02-28 ENCOUNTER — Other Ambulatory Visit: Payer: Self-pay

## 2021-02-28 ENCOUNTER — Ambulatory Visit
Admission: RE | Admit: 2021-02-28 | Discharge: 2021-02-28 | Disposition: A | Discharge status: Home / Self Care | Payer: Medicare Other | Source: Ambulatory Visit | Attending: Internal Medicine | Admitting: Internal Medicine

## 2021-02-28 DIAGNOSIS — R937 Abnormal findings on diagnostic imaging of other parts of musculoskeletal system: Secondary | ICD-10-CM | POA: Insufficient documentation

## 2021-02-28 DIAGNOSIS — M5116 Intervertebral disc disorders with radiculopathy, lumbar region: Secondary | ICD-10-CM | POA: Diagnosis not present

## 2021-02-28 DIAGNOSIS — M5416 Radiculopathy, lumbar region: Secondary | ICD-10-CM | POA: Insufficient documentation

## 2021-02-28 DIAGNOSIS — M4726 Other spondylosis with radiculopathy, lumbar region: Secondary | ICD-10-CM | POA: Diagnosis not present

## 2021-02-28 DIAGNOSIS — R159 Full incontinence of feces: Secondary | ICD-10-CM | POA: Diagnosis present

## 2021-02-28 DIAGNOSIS — M4727 Other spondylosis with radiculopathy, lumbosacral region: Secondary | ICD-10-CM | POA: Diagnosis not present

## 2021-02-28 DIAGNOSIS — M5117 Intervertebral disc disorders with radiculopathy, lumbosacral region: Secondary | ICD-10-CM | POA: Diagnosis not present

## 2021-03-13 ENCOUNTER — Telehealth: Payer: Self-pay | Admitting: Internal Medicine

## 2021-03-13 ENCOUNTER — Encounter: Admit: 2021-03-13 | Discharge: 2021-03-14 | Payer: MEDICARE

## 2021-03-13 DIAGNOSIS — L509 Urticaria, unspecified: Principal | ICD-10-CM

## 2021-03-13 NOTE — Telephone Encounter (Signed)
Patient called about her mri results.

## 2021-03-14 NOTE — Telephone Encounter (Signed)
Patient called again regarding her MRI results.

## 2021-03-15 NOTE — Telephone Encounter (Signed)
I called the patient and informed her that her MRI has not resulted but when it does we will call her. And she understood.  Heather Sweeney,cma

## 2021-03-18 ENCOUNTER — Telehealth: Payer: Self-pay | Admitting: Internal Medicine

## 2021-03-18 DIAGNOSIS — M25569 Pain in unspecified knee: Secondary | ICD-10-CM | POA: Diagnosis not present

## 2021-03-18 DIAGNOSIS — M797 Fibromyalgia: Secondary | ICD-10-CM | POA: Diagnosis not present

## 2021-03-18 DIAGNOSIS — M47816 Spondylosis without myelopathy or radiculopathy, lumbar region: Secondary | ICD-10-CM | POA: Diagnosis not present

## 2021-03-18 DIAGNOSIS — Z79891 Long term (current) use of opiate analgesic: Secondary | ICD-10-CM | POA: Diagnosis not present

## 2021-03-18 DIAGNOSIS — G894 Chronic pain syndrome: Secondary | ICD-10-CM | POA: Diagnosis not present

## 2021-03-18 DIAGNOSIS — M5451 Vertebrogenic low back pain: Secondary | ICD-10-CM | POA: Diagnosis not present

## 2021-03-18 DIAGNOSIS — Z5181 Encounter for therapeutic drug level monitoring: Secondary | ICD-10-CM | POA: Diagnosis not present

## 2021-03-18 NOTE — Telephone Encounter (Signed)
Received call and Patient was currently at her pain management appointment. Patient claims she had a MRI done of her knee.   Informed Patient and the doctor that patient has had an x-ray of the knee 01/04/21 and MRI of the lumbar spine 02/28/21. Pain management doctor claims they have both of those results.   Patient states she had a second opinion on her knee in [place] and they did the imaging on her knee. Patient did get a referral to [facility] in [place] on 12/31/20. We do not have any imaging or notes from them scanned into the Patient's chart.   Patient and pain management doctor verbalized understanding.

## 2021-03-19 DIAGNOSIS — G4733 Obstructive sleep apnea (adult) (pediatric): Secondary | ICD-10-CM | POA: Diagnosis not present

## 2021-03-19 DIAGNOSIS — J3489 Other specified disorders of nose and nasal sinuses: Secondary | ICD-10-CM | POA: Diagnosis not present

## 2021-03-19 DIAGNOSIS — H9319 Tinnitus, unspecified ear: Secondary | ICD-10-CM | POA: Diagnosis not present

## 2021-03-19 DIAGNOSIS — H903 Sensorineural hearing loss, bilateral: Secondary | ICD-10-CM | POA: Diagnosis not present

## 2021-03-23 ENCOUNTER — Other Ambulatory Visit: Payer: Self-pay | Admitting: Internal Medicine

## 2021-03-23 DIAGNOSIS — G4733 Obstructive sleep apnea (adult) (pediatric): Secondary | ICD-10-CM

## 2021-03-23 DIAGNOSIS — Z87898 Personal history of other specified conditions: Secondary | ICD-10-CM

## 2021-03-23 DIAGNOSIS — I10 Essential (primary) hypertension: Secondary | ICD-10-CM

## 2021-03-23 DIAGNOSIS — F39 Unspecified mood [affective] disorder: Secondary | ICD-10-CM

## 2021-03-23 DIAGNOSIS — Z6841 Body Mass Index (BMI) 40.0 and over, adult: Secondary | ICD-10-CM

## 2021-03-25 ENCOUNTER — Ambulatory Visit (INDEPENDENT_AMBULATORY_CARE_PROVIDER_SITE_OTHER): Payer: Medicare Other | Admitting: *Deleted

## 2021-03-25 DIAGNOSIS — K220 Achalasia of cardia: Secondary | ICD-10-CM

## 2021-03-25 DIAGNOSIS — R1310 Dysphagia, unspecified: Secondary | ICD-10-CM

## 2021-03-25 DIAGNOSIS — I10 Essential (primary) hypertension: Secondary | ICD-10-CM

## 2021-03-25 NOTE — Chronic Care Management (AMB) (Signed)
Chronic Care Management   CCM RN Visit Note  03/25/2021 Name: Heather Sweeney MRN: [id number] DOB: 12-06-1965  Subjective: Heather Sweeney is a 55 y.o. year old female who is a primary care patient of McLean-Scocuzza, Nino Glow, MD. The care management team was consulted for assistance with disease management and care coordination needs.    Engaged with patient by telephone for follow up visit in response to provider referral for case management and/or care coordination services.   Consent to Services:  The patient was given information about Chronic Care Management services, agreed to services, and gave verbal consent prior to initiation of services.  Please see initial visit note for detailed documentation.   Patient agreed to services and verbal consent obtained.   Assessment: Review of patient past medical history, allergies, medications, health status, including review of consultants reports, laboratory and other test data, was performed as part of comprehensive evaluation and provision of chronic care management services.   SDOH (Social Determinants of Health) assessments and interventions performed:    CCM Care Plan  Allergies  Allergen Reactions   Cephalexin Swelling and Rash   Hctz [Hydrochlorothiazide] Rash and Swelling    Mild she says   Lisinopril Hives and Other (See Comments)    Headache, Low BP, angioedema     Losartan Hives and Swelling    Angioedema   Penicillins Rash    Has patient had a PCN reaction causing immediate rash, facial/tongue/throat swelling, SOB or lightheadedness with hypotension: Yes Has patient had a PCN reaction causing severe rash involving mucus membranes or skin necrosis: No Has patient had a PCN reaction that required hospitalization: No Has patient had a PCN reaction occurring within the last 10 years: No If all of the above answers are "NO", then may proceed with Cephalosporin use.    Bee Venom     Swelling    Metformin And Related      Rash     Outpatient Encounter Medications as of 03/25/2021  Medication Sig Note   amLODipine (NORVASC) 5 MG tablet Take 1 tablet (5 mg total) by mouth daily.    benztropine (COGENTIN) 1 MG tablet Take 1 mg by mouth 2 (two) times daily.    cetirizine (ZYRTEC ALLERGY) 10 MG tablet Take 2 tablets (20 mg total) by mouth daily.    diphenhydrAMINE (BENADRYL) 25 MG tablet Take 25 mg by mouth every 6 (six) hours as needed.    famotidine (PEPCID) 40 MG tablet Take 1 tablet (40 mg total) by mouth daily.    FLUoxetine (PROZAC) 20 MG tablet Take 20 mg by mouth daily.    fluPHENAZine (PROLIXIN) 2.5 MG tablet Take 1 tablet (2.5 mg total) by mouth at bedtime. (Patient taking differently: Take 2.5 mg by mouth at bedtime. Take with 5 mg to equal 7.5 mg)    fluPHENAZine (PROLIXIN) 5 MG tablet Take 5 mg by mouth daily. Take with 2.5 mg to equal 7.5 mg    hydrOXYzine (ATARAX/VISTARIL) 25 MG tablet Take 1 tablet (25 mg total) by mouth every 8 (eight) hours as needed.    Melatonin 10 MG TABS Take by mouth at bedtime.    meloxicam (MOBIC) 15 MG tablet Take 15 mg by mouth daily. 03/25/2021: Reports taking as needed   metoprolol succinate (TOPROL-XL) 25 MG 24 hr tablet Take 1 tablet (25 mg total) by mouth daily. Take with or immediately following a meal.    oxyCODONE-acetaminophen (PERCOCET) 10-325 MG tablet Take 1 tablet by mouth 3 (three) times  daily as needed.    QUEtiapine (SEROQUEL XR) 300 MG 24 hr tablet Take 300 mg by mouth daily.    topiramate (TOPAMAX) 200 MG tablet Take 200 mg by mouth daily.    Biotin w/ Vitamins C & E (HAIR/SKIN/NAILS PO) Take 2,500 mcg by mouth daily.    Blood Glucose Calibration (TRUE METRIX LEVEL 1) Low SOLN     clobetasol cream (TEMOVATE) 0.86 % Apply 1 application topically 2 (two) times daily.    Clotrimazole 1 % OINT Apply 1 application topically in the morning and at bedtime.    clotrimazole-betamethasone (LOTRISONE) cream Apply externally BID prn sx up to 2 wks (Patient not  taking: Reported on 02/25/2021) 02/25/2021: Completed 2 weeks of treatment   EPINEPHrine 0.3 mg/0.3 mL IJ SOAJ injection Inject 0.3 mg into the muscle as needed for anaphylaxis.    fexofenadine (ALLEGRA) 180 MG tablet Take 2 tablets by mouth every morning.    FIBER ADULT GUMMIES PO Take by mouth.    fluconazole (DIFLUCAN) 150 MG tablet Take 1 tablet (150 mg total) by mouth once a week. X 2-4 weeks (Patient not taking: Reported on 02/25/2021) 02/25/2021: Completed course   fluticasone (FLONASE) 50 MCG/ACT nasal spray Place 2 sprays into both nostrils daily.    hydrocortisone 2.5 % ointment Apply topically 2 (two) times daily. Left lower abdomen    montelukast (SINGULAIR) 10 MG tablet Take 1 tablet (10 mg total) by mouth daily. (Patient not taking: Reported on 03/25/2021)    Multiple Vitamins-Minerals (ALIVE MULTI-VITAMIN PO) Take by mouth daily in the afternoon.    mupirocin ointment (BACTROBAN) 2 % Apply 1 application topically 3 (three) times daily. Scalp (Patient not taking: Reported on 02/25/2021)    mupirocin ointment (BACTROBAN) 2 % Apply 1 application topically 2 (two) times daily.    omalizumab Arvid Right) 150 MG/ML prefilled syringe Inject 300 mg into the skin every 14 (fourteen) days.    Probiotic Product (PROBIOTIC DAILY PO) Take by mouth. probiogen probiotic daily    VICTOZA 18 MG/3ML SOPN Inject 1.8 mg into the skin daily. 03/25/2021: Needs to speak with provider before restarting this medication   zinc oxide 20 % ointment Apply 1 application topically 2 (two) times daily as needed for irritation.    No facility-administered encounter medications on file as of 03/25/2021.    Patient Active Problem List   Diagnosis Date Noted   Abnormal MRI, lumbar spine 02/12/2021   Incontinence of feces 02/12/2021   Intertrigo 02/12/2021   Arthritis of right knee 10/25/2020   Aperistalsis, esophagus 10/25/2020   Insomnia 10/11/2020   Elevated liver enzymes 08/17/2020   Prediabetes 07/24/2020    Arthritis of both knees 06/08/2020   Annual physical exam 04/20/2020   Ingrown right big toenail 04/20/2020   Gastroesophageal reflux disease 04/20/2020   Pain and swelling of right knee 04/20/2020   Mitral valve insufficiency 01/10/2020   Bacterial vaginosis 10/20/2019   Encounter for screening colonoscopy    Hives 02/23/2019   Urticaria 02/23/2019   Vitamin D deficiency 02/01/2019   Mood disorder ([place]) 12/24/2018   HLD (hyperlipidemia) 09/01/2018   History of prediabetes 09/01/2018   Chronic pain 08/31/2018   Allergic rhinitis 08/31/2018   Lumbosacral radiculitis 02/19/2018   S/P laparoscopic cholecystectomy April 2019 01/12/2018   Angioedema 07/07/2017   Bilateral carpal tunnel syndrome 11/03/2016   Frequent PVCs 09/24/2016   Morbid obesity with BMI of 40.0-44.9, adult ([place]) 08/07/2016   Essential hypertension 07/10/2016   OSA on CPAP 07/10/2016  Conditions to be addressed/monitored:HTN and Malnutrition  Care Plan : Wellness (Adult)  Updates made by Leona Singleton, RN since 03/25/2021 12:00 AM     Problem: Healthy Nutrition (Wellness)   Priority: Medium     Long-Range Goal: Patient will report eating atleast 2-3 meals a day in the next 180 days   Start Date: 02/25/2021  Expected End Date: 09/04/2021  This Visit's Progress: Not on track  Priority: Medium  Note:   Current Barriers:  Ineffective Self Health Maintenance; patient reporting only eating about one meal a day due to decrease in appetite.  Continues to report eating only one meal a day.  Has history of Aperistalsis Esophagus with difficulties swallowing pills and some foods, but patient stating this is not the reason she does not eat much.  States she just does not have an appetite.  Patient also reporting she has received supply of Victoza, medication that was stopped a while back.  Unsure if she is suppose to resume medication.  Instructed not to take Victoza and to contact CCM Pharmacist Catie or Dr.  Terese Door.  Stated she would not resume medication prior to speaking with provider. Clinical Goal(s):  Collaboration with McLean-Scocuzza, Nino Glow, MD regarding development and update of comprehensive plan of care as evidenced by provider attestation and co-signature Inter-disciplinary care team collaboration (see longitudinal plan of care) patient will work with care management team to address care coordination and chronic disease management needs related to Disease Management and Educational Needs   Interventions:  Evaluation of current treatment plan related to  decreased nutrition/Aperistalsis Esophagus ,  due to decreased appetite and knowledge deficit related to adequate nutrition  self-management and patient's adherence to plan as established by provider. Collaboration with McLean-Scocuzza, Nino Glow, MD regarding development and update of comprehensive plan of care as evidenced by provider attestation       and co-signature Inter-disciplinary care team collaboration (see longitudinal plan of care) Discussed plans with patient for ongoing care management follow up and provided patient with direct contact information for care management team Reviewed current dietary intake and exercise levels and encouraged small steps toward making change to eating and exercising increasing activity as tolerated Encouraged patient to discuss decreased appetite and meal intake with providers Discussed with patient need to eat at least 3 meals a day or 5-6 small meals a day for adequate nutrition and appropriate metabolism Incremental change encouraged Making healthy food choices encouraged Confirmed with patient, history of aperistalsis esophagus not the reason for decreased nutrition; per patient it is not, just decreased in appetite Sending EMMI video on nutrition and diabetes Discussed importance of nutrition related to health Reviewed and encouraged healthier meal and drink options Reviewed  medications and indications and encouraged medication compliance, sending medication list to patient Instructed patient to not take Victoza until she spoke with provider, provided patient with contact information for CCM Pharmacist Catie Patient Goals/Self Care Activities: Fill half of plate with vegetables Keep a food diary Set a realistic goal  Discuss decrease in appetite with PCP or stomach doctor Eat at least 3 meals day Follow Up Plan: The care management team will reach out to the patient again over the next 30 business days.      Care Plan : Hypertension (Adult)  Updates made by Leona Singleton, RN since 03/25/2021 12:00 AM     Problem: Hypertension (Hypertension)   Priority: Medium     Long-Range Goal: Hypertension Monitored   Start Date: 02/25/2021  Expected End  Date: 09/04/2021  This Visit's Progress: Not on track  Priority: Medium  Note:   Objective:  Last practice recorded BP readings:  BP Readings from Last 3 Encounters:  02/15/21 117/77  02/14/21 122/78  02/12/21 124/70  Current Barriers:  Knowledge Deficits related to basic understanding of hypertension pathophysiology and self care management as evidenced by patient reporting blood pressures at home runs 150/70's.  Patient stating her husband has been helping to check her blood pressures at home, occasionally.  Unsure of the readings (have not been writing down) but states husband says readings are good.  Confirmed patient has received Calendar Booklet and encouraged her to use to help document her blood pressures. Case Manager Clinical Goal(s):  patient will verbalize understanding of plan for hypertension management patient will attend all scheduled medical appointments: 8/10 with PCP patient will demonstrate improved adherence to prescribed treatment plan for hypertension as evidenced by taking all medications as prescribed, monitoring and recording blood pressure as directed, adhering to low sodium/DASH  diet Interventions:  Collaboration with McLean-Scocuzza, Nino Glow, MD regarding development and update of comprehensive plan of care as evidenced by provider attestation and co-signature Inter-disciplinary care team collaboration (see longitudinal plan of care) Encouraged continued use of home blood pressure monitoring and recording in blood pressure log; include symptoms of hypotension or potential medication side effects in log Shared overall cardiovascular risk with patient; encourage changes to lifestyle risk factors, including alcohol consumption, smoking, inadequate exercise, poor dietary habits and stress Evaluation of current treatment plan related to hypertension self management and patient's adherence to plan as established by provider. Provided education to patient re: stroke prevention, s/s of heart attack and stroke, DASH diet, complications of uncontrolled blood pressure Reviewed medications with patient and discussed importance of compliance Discussed plans with patient for ongoing care management follow up and provided patient with direct contact information for care management team Advised patient, providing education and rationale, to monitor blood pressure 3 times a week and record, calling PCP for findings outside established parameters.  Home blood pressure monitoring encouraged; discussed with patient seeking assistance from husband in checking her blood pressures at home Encouraged to attend all scheduled provider appointments  Encouraged to call provider office for new concerns, questions, or BP outside discussed parameters   Discussed and encouraged to follow a low sodium diet/DASH diet Sent education on hypertension and encouraged patient to review information Sent 2022 [place] to help with logging blood pressures; encouraged patient to use booklet to document blood pressures Patient Goals/Self-Care Activities: Check blood pressure 3 times per week Ask husband  to help you check your blood pressure Write blood pressure results in [place] Take log to providers office for review Review education sent to MyChart  Follow Up Plan: The care management team will reach out to the patient again over the next 30 business days.         Plan:The care management team will reach out to the patient again over the next 30 business days.  Hubert Azure RN, MSN RN Care Management Coordinator [place] [phone] [email or web address]

## 2021-03-25 NOTE — Patient Instructions (Addendum)
Visit Information  Heather Sweeney, here is a list of medicine I could put together from your other provider notes.  Please review your list and take this list to your upcoming appointments to check for accuracy.  DO NOT MAKE ANY MEDICATION CHANGES BASED ON THIS LIST OF MEDICATIONS UNTIL VERIFIED WITH YOUR DOCTORS.  DISREGAURD THE LIST THAT SHOWS LATER IN THIS REPORT.  PLEASE CALL ME FOR ANY QUESTIONS OR CONCERNS.  Heather Sweeney RNCM   [phone]  amLODipine (NORVASC) 5 MG tablet (for blood pressure) Take 1 tablet (5 mg total) by mouth daily.   Biotin w/ Vitamins C & E (HAIR/SKIN/NAILS vitamin) Take 2,500 mcg by mouth daily.   Benztropine (Cogentin) 1 mg tablet (for mood) Take 1 tablet twice a day   cetirizine (ZYRTEC ALLERGY) 10 MG tablet (allergy medicine) Take 2 tablets (20 mg total) by mouth daily at bedtime.   clobetasol cream (TEMOVATE) 0.05 % (for skin) Apply 1 application topically 2 (two) times daily.   Diphenhydramine (Benadryl) 25 mg    EPINEPHrine 0.3 mg/0.3 mL IJ SOAJ injection Inject 0.3 mg into the muscle as needed for anaphylaxis.   famotidine (PEPCID) 40 MG tablet  (for skin/stomach) Take 1 tablet (40 mg total) by mouth twice a day per Dermatology.   fexofenadine (ALLEGRA) 180 MG tablet (Allergy Medicine) Take 2 tablets by mouth every morning.       FIBER ADULT GUMMIES PO (vitamins) Take by mouth.   FLUoxetine (PROZAC) 10 MG capsule (depression) Take 10 mg by mouth daily.   fluPHENAZine (PROLIXIN) 2.5 MG tablet  (treat mood) Take 1 tablet (2.5 mg total) by mouth at bedtime. (taking with 5 mg tablet to make dose 7.5 mg at bedtime)   fluticasone (FLONASE) 50 MCG/ACT nasal spray (treat allergies) Place 2 sprays into both nostrils daily.   hydrocortisone 2.5 % lotion Apply topically 2 (two) times daily on the face.   hydrOXYzine (ATARAX/VISTARIL) 25 MG tablet (for itching/rash) Hydroxazine 25 mg once in the morning, once in the evening and then 50 mg at night    Insulin Pen Needle  (PEN NEEDLES) 30G X 8 MM MISC 1 Device by Does not apply route daily.   liraglutide (VICTOZA) 18 MG/3ML SOPN (for diabetes--DO NOT TAKE;  Speak with your doctor first or CCM Pharmacist Catie) Inject 1.8 mg into the skin daily.   Loperamide HCl (IMODIUM A-D PO) Take by mouth.   Melatonin 10 MG TABS (for sleep) Take by mouth at bedtime.   metoprolol succinate (TOPROL-XL) 25 MG 24 hr tablet  (for your heart and blood pressure) Take 1 tablet (25 mg total) by mouth daily. Take with or immediately following a meal.   Meloxicam (Mobic) 15 mg (for arthritis) Take 1 tablet daily   montelukast (SINGULAIR) 10 MG tablet (for breathing) Take 1 tablet (10 mg total) by mouth daily.   Multiple Vitamins-Minerals (ALIVE MULTI-VITAMIN PO) Take by mouth daily in the afternoon.   mupirocin ointment (BACTROBAN) 2 % Apply 1 application topically 3 (three) times daily. Scalp   omalizumab Arvid Right) 150 MG/ML prefilled syringe Inject 300 mg into the skin every 30 (thirty) days.   oxyCODONE-acetaminophen (PERCOCET) 10-325 MG tablet (pain medicine) Take 1 tablet by mouth 3 (three) times daily as needed.   Probiotic Product (PROBIOTIC DAILY PO) Take by mouth. probiogen probiotic daily   Quetiapine (Seroquel XR) 300 mg (for mood) Take 1 tablet daily   topiramate (TOPAMAX) 100 MG tablet (for mood or seizures) Take 100 mg by mouth daily.  triamcinolone cream (KENALOG) 0.1 % (for itching, rash) Apply twice a day as needed only, only to areas with active hives breakouts. Marland Kitchen     PATIENT GOALS:  Goals Addressed             This Visit's Progress    (RNCM) Eat Healthy   Not on track    Timeframe:  Long-Range Goal Priority:  Medium Start Date:   02/25/21                          Expected End Date: 09/04/21                      Follow Up Date 04/22/21    Fill half of plate with vegetables Keep a food diary Set a realistic goal  Discuss decrease in appetite with PCP or stomach doctor Eat at least 3 meals day   Why is  this important?   When you are ready to manage your nutrition or weight, having a plan and setting goals will help.  Taking small steps to change how you eat and exercise is a good place to start.    Notes:       (RNCM) Track and Manage My Blood Pressure-Hypertension   Not on track    Timeframe:  Long-Range Goal Priority:  Medium Start Date:  02/25/21                           Expected End Date: 09/04/21                      Follow Up Date 04/22/21    Check blood pressure 3 times per week Ask husband to help you check your blood pressure Write blood pressure results in [place] Take log to providers office for review Review education sent to MyChart    Why is this important?   You won't feel high blood pressure, but it can still hurt your blood vessels.  High blood pressure can cause heart or kidney problems. It can also cause a stroke.  Making lifestyle changes like losing a little weight or eating less salt will help.  Checking your blood pressure at home and at different times of the day can help to control blood pressure.  If the doctor prescribes medicine remember to take it the way the doctor ordered.  Call the office if you cannot afford the medicine or if there are questions about it.     Notes:          Patient verbalizes understanding of instructions provided today and agrees to view in [place].   The care management team will reach out to the patient again over the next 30 business days.   Heather Azure RN, MSN RN Care Management Coordinator [place] [phone] [email or web address]

## 2021-04-02 ENCOUNTER — Telehealth: Payer: Self-pay

## 2021-04-02 NOTE — Telephone Encounter (Signed)
Do I or anyone have any visits sch please? If pt cant wait go to [facility] urgent care or call dermatology for skin rash [facility]  Abdominal pain if more internal needs appt

## 2021-04-02 NOTE — Telephone Encounter (Signed)
Patient stated she will try to get an appointment with her GYN.

## 2021-04-02 NOTE — Telephone Encounter (Signed)
Pt called and states that she is having UTI symptoms for four days. She states that it burns when she urinates and hurts on her left side abdomen. Transferred to [facility] to triage and let her know that we have no appts avail-

## 2021-04-03 DIAGNOSIS — J069 Acute upper respiratory infection, unspecified: Secondary | ICD-10-CM | POA: Diagnosis not present

## 2021-04-03 NOTE — Telephone Encounter (Signed)
No answer, no voicemail.   Patient established with Dermatology, needs to reach out to them. For urgent appointment can see urgent care or schedule soonest available in our office.

## 2021-04-04 ENCOUNTER — Other Ambulatory Visit: Payer: Self-pay | Admitting: Internal Medicine

## 2021-04-04 DIAGNOSIS — I10 Essential (primary) hypertension: Secondary | ICD-10-CM

## 2021-04-08 ENCOUNTER — Other Ambulatory Visit: Payer: Self-pay | Admitting: Internal Medicine

## 2021-04-08 MED ORDER — MONTELUKAST SODIUM 10 MG PO TABS: 10.0000 mg | ORAL_TABLET | Freq: Every day | ORAL | 3 refills | Status: DC

## 2021-04-17 ENCOUNTER — Other Ambulatory Visit (HOSPITAL_COMMUNITY)
Admission: RE | Admit: 2021-04-17 | Discharge: 2021-04-17 | Disposition: A | Discharge status: Home / Self Care | Payer: Medicare Other | Source: Ambulatory Visit | Attending: Internal Medicine | Admitting: Internal Medicine

## 2021-04-17 ENCOUNTER — Other Ambulatory Visit: Payer: Self-pay | Admitting: Internal Medicine

## 2021-04-17 ENCOUNTER — Telehealth: Payer: Self-pay

## 2021-04-17 ENCOUNTER — Encounter: Payer: Self-pay | Admitting: Internal Medicine

## 2021-04-17 ENCOUNTER — Other Ambulatory Visit: Payer: Self-pay

## 2021-04-17 ENCOUNTER — Ambulatory Visit (INDEPENDENT_AMBULATORY_CARE_PROVIDER_SITE_OTHER): Payer: Medicare Other | Admitting: Internal Medicine

## 2021-04-17 VITALS — BP 130/76 | HR 78 | Temp 98.3°F | Ht 63.0 in | Wt 239.8 lb

## 2021-04-17 DIAGNOSIS — R739 Hyperglycemia, unspecified: Secondary | ICD-10-CM | POA: Diagnosis not present

## 2021-04-17 DIAGNOSIS — Z0000 Encounter for general adult medical examination without abnormal findings: Secondary | ICD-10-CM | POA: Diagnosis not present

## 2021-04-17 DIAGNOSIS — N760 Acute vaginitis: Secondary | ICD-10-CM | POA: Diagnosis not present

## 2021-04-17 DIAGNOSIS — L304 Erythema intertrigo: Secondary | ICD-10-CM

## 2021-04-17 DIAGNOSIS — R600 Localized edema: Secondary | ICD-10-CM | POA: Diagnosis not present

## 2021-04-17 DIAGNOSIS — R7303 Prediabetes: Secondary | ICD-10-CM

## 2021-04-17 DIAGNOSIS — N3000 Acute cystitis without hematuria: Secondary | ICD-10-CM

## 2021-04-17 DIAGNOSIS — L22 Diaper dermatitis: Secondary | ICD-10-CM | POA: Diagnosis not present

## 2021-04-17 DIAGNOSIS — I10 Essential (primary) hypertension: Secondary | ICD-10-CM

## 2021-04-17 MED ORDER — HYDROCORTISONE 2.5 % EX OINT: TOPICAL_OINTMENT | Freq: Two times a day (BID) | CUTANEOUS | 5 refills | Status: DC

## 2021-04-17 MED ORDER — CLOTRIMAZOLE 1 % EX CREA: 1.0000 "application " | TOPICAL_CREAM | Freq: Two times a day (BID) | CUTANEOUS | 2 refills | Status: DC

## 2021-04-17 MED ORDER — ZINC OXIDE 20 % EX OINT: 1.0000 "application " | TOPICAL_OINTMENT | Freq: Two times a day (BID) | CUTANEOUS | 2 refills | Status: DC | PRN

## 2021-04-17 MED ORDER — FLUCONAZOLE 150 MG PO TABS: 150.0000 mg | ORAL_TABLET | ORAL | 0 refills | Status: DC

## 2021-04-17 MED ORDER — CLOTRIMAZOLE 1 % EX OINT
1.0000 | TOPICAL_OINTMENT | Freq: Two times a day (BID) | CUTANEOUS | 2 refills | Status: DC
Start: 2021-04-17 — End: 2021-04-17

## 2021-04-17 MED ORDER — FUROSEMIDE 20 MG PO TABS: 10.0000 mg | ORAL_TABLET | Freq: Every day | ORAL | 3 refills | Status: DC | PRN

## 2021-04-17 NOTE — Patient Instructions (Addendum)
[phone] cell #   Heather Rob eve or vagisil wipes unscented and bodywash  Epsolm salt baths   Think about pfizer vaccine booster    Cotton underwear Mammogram due 08/22/21 order in call to schedule    Intertrigo Intertrigo is skin irritation or inflammation (dermatitis) that occurs when folds of skin rub Sweeney. The irritation can cause a rash and make skin raw and itchy. This condition most commonly occurs in the skin folds of these areas: Toes. Armpits. Groin. Under the belly. Under the breasts. Buttocks. Intertrigo is not passed from person to person (is not contagious). What are the causes? This condition is caused by heat, moisture, rubbing (friction), and not enough air circulation. The condition can be made worse by: Sweat. Bacteria. A fungus, such as yeast. What increases the risk? This condition is more likely to occur if you have moisture in your skin folds. You are more likely to develop this condition if you: Have diabetes. Are overweight. Are not able to move around or are not active. Live in a warm and moist climate. Wear splints, braces, or other medical devices. Are not able to control your bowels or bladder (have incontinence). What are the signs or symptoms? Symptoms of this condition include: A pink or red skin rash in the skin fold or near the skin fold. Raw or scaly skin. Itchiness. A burning feeling. Bleeding. Leaking fluid. A bad smell. How is this diagnosed? This condition is diagnosed with a medical history and physical exam. You mayalso have a skin swab to test for bacteria or a fungus. How is this treated? This condition may be treated by: Cleaning and drying your skin. Taking an antibiotic medicine or using an antibiotic skin cream for a bacterial infection. Using an antifungal cream on your skin or taking pills for an infection that was caused by a fungus, such as yeast. Using a steroid ointment to relieve itchiness and  irritation. Separating the skin fold with a clean cotton cloth to absorb moisture and allow air to flow into the area. Follow these instructions at home: Keep the affected area clean and dry. Do not scratch your skin. Stay in a cool environment as much as possible. Use an air conditioner or fan, if available. Apply over-the-counter and prescription medicines only as told by your health care provider. If you were prescribed an antibiotic medicine, use it as told by your health care provider. Do not stop using the antibiotic even if your condition improves. Keep all follow-up visits as told by your health care provider. This is important. How is this prevented?  Maintain a healthy weight. Take care of your feet, especially if you have diabetes. Foot care includes: Wearing shoes that fit well. Keeping your feet dry. Wearing clean, breathable socks. Protect the skin around your groin and buttocks, especially if you have incontinence. Skin protection includes: Following a regular cleaning routine. Using skin protectant creams, powders, or ointments. Changing protection pads frequently. Do not wear tight clothes. Wear clothes that are loose, absorbent, and made of cotton. Wear a bra that gives good support, if needed. Shower and dry yourself well after activity or exercise. Use a hair dryer on a cool setting to dry between skin folds, especially after you bathe. If you have diabetes, keep your blood sugar under control. Contact a health care provider if: Your symptoms do not improve with treatment. Your symptoms get worse or they spread. You notice increased redness and warmth. You have a fever. Summary Intertrigo is skin  irritation or inflammation (dermatitis) that occurs when folds of skin rub Sweeney. This condition is caused by heat, moisture, rubbing (friction), and not enough air circulation. This condition may be treated by cleaning and drying your skin and with medicines. Apply  over-the-counter and prescription medicines only as told by your health care provider. Keep all follow-up visits as told by your health care provider. This is important. This information is not intended to replace advice given to you by your health care provider. Make sure you discuss any questions you have with your healthcare provider. Document Revised: 02/15/2018 Document Reviewed: 02/22/2018 Elsevier Patient Education  Heather Sweeney.

## 2021-04-17 NOTE — Telephone Encounter (Signed)
Pharmacy is stating that they can not get nor have any clotrimazole 1% ointment. They only thing available is cream or solution they would like a new order placed. Please advise.

## 2021-04-17 NOTE — Progress Notes (Signed)
Chief Complaint  Patient presents with   Follow-up   Urinary Tract Infection    Burning with urination and itching    Annual  1. Htn controlled on norvasc 5 mg qd toprol xl 25 mg qd  2. C/o leg edema mild wants to start on fluid pill  3. C/o burning with urinating using wipes ?name x 2 weeks and itching   Review of Systems  Constitutional:  Negative for weight loss.  HENT:  Negative for hearing loss.   Eyes:  Negative for blurred vision.  Respiratory:  Negative for shortness of breath.   Cardiovascular:  Negative for chest pain.  Gastrointestinal:  Negative for abdominal pain.  Genitourinary:  Positive for dysuria.  Musculoskeletal:  Negative for falls and joint pain.  Skin:  Negative for rash.  Psychiatric/Behavioral:  Negative for depression.   Past Medical History:  Diagnosis Date   Allergic rhinitis    Asthma    Bipolar disorder ([place])    Chronic low back pain    CTS (carpal tunnel syndrome)    hands   Depression    Gallstones    GERD (gastroesophageal reflux disease)    History of prediabetes    Hypertension    Laceration of right foot    Onychomycosis    OSA on CPAP    Overactive bladder    PVC's (premature ventricular contractions)    PVC's (premature ventricular contractions)    Dr. Raliegh Ip cards   Seizures [facility])    8th grade   Past Surgical History:  Procedure Laterality Date   ABDOMINAL HYSTERECTOMY  07/08/2011   ABDOMINAL HYSTERECTOMY     supracervical in 07/2011    [place]     right hand [place]     2012   CHOLECYSTECTOMY N/A 01/12/2018   Procedure: LAPAROSCOPIC CHOLECYSTECTOMY WITH INTRAOPERATIVE CHOLANGIOGRAM ERAS PATHWAY;  Surgeon: Johnathan Hausen, MD;  Location: [facility] ORS;  Service: General;  Laterality: N/A;   COLONOSCOPY WITH PROPOFOL N/A 04/15/2019   Procedure: COLONOSCOPY WITH PROPOFOL;  Surgeon: Jonathon Bellows, MD;  Location: [facility] ENDOSCOPY;  Service: Gastroenterology;  Laterality: N/A;   DILATION AND CURETTAGE OF UTERUS      04/2011 benign endometrial polyp squamous metaplasia, inflamm, blood, mucous, benign    ESOPHAGOGASTRODUODENOSCOPY (EGD) WITH PROPOFOL N/A 10/23/2020   Procedure: ESOPHAGOGASTRODUODENOSCOPY (EGD) WITH PROPOFOL;  Surgeon: Jonathon Bellows, MD;  Location: [facility] ENDOSCOPY;  Service: Gastroenterology;  Laterality: N/A;   FLEXIBLE SIGMOIDOSCOPY N/A 02/15/2021   Procedure: FLEXIBLE SIGMOIDOSCOPY;  Surgeon: Jonathon Bellows, MD;  Location: [facility] ENDOSCOPY;  Service: Gastroenterology;  Laterality: N/A;   FOOT SURGERY     Dr. Prudence Davidson    LEEP     2006   TUBAL LIGATION     Family History  Problem Relation Age of Onset   Cancer Father        esophageal    Hypertension Father    Heart failure Mother    Hypertension Mother    Early death Mother    Heart disease Mother        chf   Breast cancer Sister 68   Cancer Sister        breast   Depression Sister    Mental illness Sister    Asthma Son    Cancer Other        brain cancer   Social History   Socioeconomic History   Marital status: Married    Spouse name: Not on file   Number of children: Not  on file   Years of education: Not on file   Highest education level: Not on file  Occupational History   Not on file  Tobacco Use   Smoking status: Never   Smokeless tobacco: Current    Types: Snuff   Tobacco comments:    Is in process of decreasing amount  Vaping Use   Vaping Use: Never used  Substance and Sexual Activity   Alcohol use: No    Alcohol/week: 0.0 standard drinks   Drug use: No   Sexual activity: Not Currently    Birth control/protection: Surgical    Comment: Hysterectomy  Other Topics Concern   Not on file  Social History Narrative   Married    No guns    Wears seat belt    Safe in relationship    Never smoker    2 sons Gaspar Bidding ~10 and another son ~28 as of 10/2019   1 granson eli 1 y.o as of 10/2019   2 sisters    Unemployed       Social Determinants of Health   Financial Resource Strain: Low Risk    Difficulty of  Paying Living Expenses: Not hard at all  Food Insecurity: No Food Insecurity   Worried About Charity fundraiser in the Last Year: Never true   Arboriculturist in the Last Year: Never true  Transportation Needs: No Transportation Needs   Lack of Transportation (Medical): No   Lack of Transportation (Non-Medical): No  Physical Activity: Not on file  Stress: No Stress Concern Present   Feeling of Stress : Not at all  Social Connections: Unknown   Frequency of Communication with Friends and Family: Not on file   Frequency of Social Gatherings with Friends and Family: Not on file   Attends Religious Services: Not on file   Active Member of Clubs or Organizations: Not on file   Attends Archivist Meetings: Not on file   Marital Status: Married  Human resources officer Violence: Not At Risk   Fear of Current or Ex-Partner: No   Emotionally Abused: No   Physically Abused: No   Sexually Abused: No   Current Meds  Medication Sig   amLODipine (NORVASC) 5 MG tablet Take 1 tablet by mouth once daily   benztropine (COGENTIN) 1 MG tablet Take 1 mg by mouth 2 (two) times daily.   Biotin w/ Vitamins C & E (HAIR/SKIN/NAILS PO) Take 2,500 mcg by mouth daily.   Blood Glucose Calibration (TRUE METRIX LEVEL 1) Low SOLN    cetirizine (ZYRTEC ALLERGY) 10 MG tablet Take 2 tablets (20 mg total) by mouth daily.   clobetasol cream (TEMOVATE) 4.65 % Apply 1 application topically 2 (two) times daily.   clotrimazole-betamethasone (LOTRISONE) cream Apply externally BID prn sx up to 2 wks   diphenhydrAMINE (BENADRYL) 25 MG tablet Take 25 mg by mouth every 6 (six) hours as needed.   EPINEPHrine 0.3 mg/0.3 mL IJ SOAJ injection Inject 0.3 mg into the muscle as needed for anaphylaxis.   famotidine (PEPCID) 40 MG tablet Take 1 tablet (40 mg total) by mouth daily.   fexofenadine (ALLEGRA) 180 MG tablet Take 2 tablets by mouth every morning.   FIBER ADULT GUMMIES PO Take by mouth.   FLUoxetine (PROZAC) 20 MG tablet  Take 20 mg by mouth daily.   fluPHENAZine (PROLIXIN) 2.5 MG tablet Take 1 tablet (2.5 mg total) by mouth at bedtime. (Patient taking differently: Take 2.5 mg by mouth at bedtime. Take  with 5 mg to equal 7.5 mg)   fluPHENAZine (PROLIXIN) 5 MG tablet Take 5 mg by mouth daily. Take with 2.5 mg to equal 7.5 mg   fluticasone (FLONASE) 50 MCG/ACT nasal spray Place 2 sprays into both nostrils daily.   furosemide (LASIX) 20 MG tablet Take 0.5 tablets (10 mg total) by mouth daily as needed.   hydrocortisone 2.5 % ointment Apply topically 2 (two) times daily. Left lower abdomen/groin   hydrOXYzine (ATARAX/VISTARIL) 25 MG tablet Take 1 tablet (25 mg total) by mouth every 8 (eight) hours as needed.   Melatonin 10 MG TABS Take by mouth at bedtime.   meloxicam (MOBIC) 15 MG tablet Take 15 mg by mouth daily.   metoprolol succinate (TOPROL-XL) 25 MG 24 hr tablet TAKE 1 TABLET BY MOUTH ONCE DAILY WITH  OR  IMMEDIATELY  FOLLOWING  A  MEAL   montelukast (SINGULAIR) 10 MG tablet Take 1 tablet (10 mg total) by mouth daily.   Multiple Vitamins-Minerals (ALIVE MULTI-VITAMIN PO) Take by mouth daily in the afternoon.   mupirocin ointment (BACTROBAN) 2 % Apply 1 application topically 3 (three) times daily. Scalp   mupirocin ointment (BACTROBAN) 2 % Apply 1 application topically 2 (two) times daily.   omalizumab Arvid Right) 150 MG/ML prefilled syringe Inject 300 mg into the skin every 14 (fourteen) days.   oxyCODONE-acetaminophen (PERCOCET) 10-325 MG tablet Take 1 tablet by mouth 3 (three) times daily as needed.   Probiotic Product (PROBIOTIC DAILY PO) Take by mouth. probiogen probiotic daily   QUEtiapine (SEROQUEL XR) 300 MG 24 hr tablet Take 300 mg by mouth daily.   topiramate (TOPAMAX) 200 MG tablet Take 200 mg by mouth daily.   VICTOZA 18 MG/3ML SOPN Inject 1.8 mg into the skin daily.   [DISCONTINUED] Clotrimazole 1 % OINT Apply 1 application topically in the morning and at bedtime.   [DISCONTINUED] fluconazole  (DIFLUCAN) 150 MG tablet Take 1 tablet (150 mg total) by mouth once a week. X 2-4 weeks   [DISCONTINUED] hydrocortisone 2.5 % ointment Apply topically 2 (two) times daily. Left lower abdomen   [DISCONTINUED] zinc oxide 20 % ointment Apply 1 application topically 2 (two) times daily as needed for irritation.   Allergies  Allergen Reactions   Cephalexin Swelling and Rash   Hctz [Hydrochlorothiazide] Rash and Swelling    Mild she says   Lisinopril Hives and Other (See Comments)    Headache, Low BP, angioedema     Losartan Hives and Swelling    Angioedema   Penicillins Rash    Has patient had a PCN reaction causing immediate rash, facial/tongue/throat swelling, SOB or lightheadedness with hypotension: Yes Has patient had a PCN reaction causing severe rash involving mucus membranes or skin necrosis: No Has patient had a PCN reaction that required hospitalization: No Has patient had a PCN reaction occurring within the last 10 years: No If all of the above answers are "NO", then may proceed with Cephalosporin use.    Bee Venom     Swelling    Metformin And Related     Rash    Recent Results (from the past 2160 hour(s))  POCT Wet Prep with KOH     Status: Normal   Collection Time: 01/23/21  2:53 PM  Result Value Ref Range   Trichomonas, UA Negative    Clue Cells Wet Prep HPF POC neg    Epithelial Wet Prep HPF POC     Yeast Wet Prep HPF POC neg    Bacteria Wet  Prep HPF POC     RBC Wet Prep HPF POC     WBC Wet Prep HPF POC     KOH Prep POC Negative Negative   Objective  Body mass index is 42.48 kg/m. Wt Readings from Last 3 Encounters:  04/17/21 239 lb 12.8 oz (108.8 kg)  02/15/21 244 lb (110.7 kg)  02/14/21 244 lb (110.7 kg)   Temp Readings from Last 3 Encounters:  04/17/21 98.3 F (36.8 C) (Oral)  02/15/21 (!) 96.5 F (35.8 C) (Temporal)  02/12/21 98 F (36.7 C) (Oral)   BP Readings from Last 3 Encounters:  04/17/21 130/76  02/15/21 117/77  02/14/21 122/78    Pulse Readings from Last 3 Encounters:  04/17/21 78  02/15/21 81  02/14/21 73    Physical Exam Vitals and nursing note reviewed.  Constitutional:      Appearance: Normal appearance. She is well-developed and well-groomed. She is obese.  HENT:     Head: Normocephalic and atraumatic.  Eyes:     Conjunctiva/sclera: Conjunctivae normal.     Pupils: Pupils are equal, round, and reactive to light.  Cardiovascular:     Rate and Rhythm: Normal rate and regular rhythm.     Heart sounds: Normal heart sounds. No murmur heard. Pulmonary:     Effort: Pulmonary effort is normal.     Breath sounds: Normal breath sounds.  Abdominal:     Comments: intertrigo  Skin:    General: Skin is warm and dry.  Neurological:     General: No focal deficit present.     Mental Status: She is alert and oriented to person, place, and time. Mental status is at baseline.     Gait: Gait normal.  Psychiatric:        Attention and Perception: Attention and perception normal.        Mood and Affect: Mood and affect normal.        Speech: Speech normal.        Behavior: Behavior normal. Behavior is cooperative.        Thought Content: Thought content normal.        Cognition and Memory: Cognition and memory normal.        Judgment: Judgment normal.    Assessment  Plan  Annual physical exam Declines flu shot allergic  Tdap utd due 04/18/2019 will do in future at f/u  [place] 2/2 consider booster  Allergic to pna vaccine Consider shingrix future Consider shingrix but given allergies use caution  Not immune MMR, hep B titer in future use caution though with h/o allergies with all vaccines 915/21 house calls visit A1c 6.1    h/o supracervical hysterectomy ([facility]), pap 02/29/16 negative no comment HPV, another pap 06/23/17 neg pap neg HPV -h/o LEEP 2006   mammo 08/04/19 negative ordered 08/22/20 negative ordered 2022    Colonoscopy 04/15/19 normal f/u in 10 years egd sch 10/23/20 Dr. Vicente Males neg bxs 10/23/20  aperistalsis and food + consider gastric emptying study and esophageal mamometry  10/25/20 Dr. Bailey Mech  Dr Jacklynn Lewis,   I will see her next Wednesday 12/01/20 at 8:15 am- march I agree is way too long  , I will order the tests at her office visit after I discuss about it with her .Thank you for reaching out to me.   Yes there has been some case reports of Victoza causing GI tract dysmotility - is holding the medication for a while an option?      Echo Dr. Nehemiah Massed -nl  LV function EF 60% moderate mitral insuff and mild tricuspid insufficiency treadmill stress test neg 07/30/13  -h/o PVCS   Hep C neg 08/17/20 HIV neg 02/04/16 Rec healthy diet and exercise   Ob/gyn appt pending bladder leakage I.e bladder sling vs other [facility] in PT (stopped PT w/o help worsen sneezing or coughing)  -Dr. Georgianne Fick pt wants surgery as of 04/20/20 sx's better and no surgery indicated    Consider referral repeat sleep study h/o OSA f/u Dr. Kathyrn Sheriff and will get procedure for OSA as of 04/17/21   Acute cystitis without hematuria - Plan: Urinalysis, Routine w reflex microscopic, Urine Culture Acute vaginitis - Plan: Urine cytology ancillary only([place])  Intertrigo - Plan: hydrocortisone 2.5 % ointment, fluconazole (DIFLUCAN) 150 MG tablet, Clotrimazole 1 % OINT  Diaper dermatitis - Plan: zinc oxide 20 % ointment, clotrimazole, hc prn   Leg edema - Plan: furosemide (LASIX) 10 MG tablet qd prn   Hyperglycemia - Plan: Hemoglobin A1c  Hypertension, controlled  Norvasc 5 mg qd, toprol 25 xl qd   Provider: Dr. Olivia Mackie McLean-Scocuzza-Internal Medicine

## 2021-04-17 NOTE — Telephone Encounter (Signed)
Resent ointment

## 2021-04-18 LAB — URINE CULTURE
MICRO NUMBER:: 12114427
SPECIMEN QUALITY:: ADEQUATE

## 2021-04-18 LAB — URINALYSIS, ROUTINE W REFLEX MICROSCOPIC
Bacteria, UA: NONE SEEN /HPF
Bilirubin Urine: NEGATIVE
Glucose, UA: NEGATIVE
Hgb urine dipstick: NEGATIVE
Hyaline Cast: NONE SEEN /LPF
Ketones, ur: NEGATIVE
Nitrite: NEGATIVE
Protein, ur: NEGATIVE
RBC / HPF: NONE SEEN /HPF (ref 0–2)
Specific Gravity, Urine: 1.006 (ref 1.001–1.035)
Squamous Epithelial / HPF: NONE SEEN /HPF (ref <=5)
pH: 6 (ref 5.0–8.0)

## 2021-04-19 LAB — URINE CYTOLOGY ANCILLARY ONLY
Bacterial Vaginitis-Urine: NEGATIVE
Candida Urine: POSITIVE — AB
Candida Urine: POSITIVE — AB
Chlamydia: NEGATIVE
Comment: NEGATIVE
Comment: NEGATIVE
Comment: NORMAL
Neisseria Gonorrhea: NEGATIVE
Trichomonas: NEGATIVE

## 2021-04-22 ENCOUNTER — Ambulatory Visit (INDEPENDENT_AMBULATORY_CARE_PROVIDER_SITE_OTHER): Payer: Medicare Other | Admitting: *Deleted

## 2021-04-22 DIAGNOSIS — I10 Essential (primary) hypertension: Secondary | ICD-10-CM | POA: Diagnosis not present

## 2021-04-22 DIAGNOSIS — K220 Achalasia of cardia: Secondary | ICD-10-CM

## 2021-04-22 NOTE — Chronic Care Management (AMB) (Signed)
Chronic Care Management   CCM RN Visit Note  04/22/2021 Name: Heather Sweeney MRN: [id number] DOB: Mar 14, 1966  Subjective: Heather Sweeney is a 55 y.o. year old female who is a primary care patient of McLean-Scocuzza, Nino Glow, MD. The care management team was consulted for assistance with disease management and care coordination needs.    Engaged with patient by telephone for follow up visit in response to provider referral for case management and/or care coordination services.   Consent to Services:  The patient was given information about Chronic Care Management services, agreed to services, and gave verbal consent prior to initiation of services.  Please see initial visit note for detailed documentation.   Patient agreed to services and verbal consent obtained.   Assessment: Review of patient past medical history, allergies, medications, health status, including review of consultants reports, laboratory and other test data, was performed as part of comprehensive evaluation and provision of chronic care management services.   SDOH (Social Determinants of Health) assessments and interventions performed:    CCM Care Plan  Allergies  Allergen Reactions   Cephalexin Swelling and Rash   Hctz [Hydrochlorothiazide] Rash and Swelling    Mild she says   Lisinopril Hives and Other (See Comments)    Headache, Low BP, angioedema     Losartan Hives and Swelling    Angioedema   Penicillins Rash    Has patient had a PCN reaction causing immediate rash, facial/tongue/throat swelling, SOB or lightheadedness with hypotension: Yes Has patient had a PCN reaction causing severe rash involving mucus membranes or skin necrosis: No Has patient had a PCN reaction that required hospitalization: No Has patient had a PCN reaction occurring within the last 10 years: No If all of the above answers are "NO", then may proceed with Cephalosporin use.    Bee Venom     Swelling    Metformin And Related      Rash     Outpatient Encounter Medications as of 04/22/2021  Medication Sig Note   furosemide (LASIX) 20 MG tablet Take 0.5 tablets (10 mg total) by mouth daily as needed.    amLODipine (NORVASC) 5 MG tablet Take 1 tablet by mouth once daily    benztropine (COGENTIN) 1 MG tablet Take 1 mg by mouth 2 (two) times daily.    Biotin w/ Vitamins C & E (HAIR/SKIN/NAILS PO) Take 2,500 mcg by mouth daily.    Blood Glucose Calibration (TRUE METRIX LEVEL 1) Low SOLN     cetirizine (ZYRTEC ALLERGY) 10 MG tablet Take 2 tablets (20 mg total) by mouth daily.    clobetasol cream (TEMOVATE) 7.67 % Apply 1 application topically 2 (two) times daily.    clotrimazole (CLOTRIMAZOLE AF) 1 % cream Apply 1 application topically 2 (two) times daily.    clotrimazole-betamethasone (LOTRISONE) cream Apply externally BID prn sx up to 2 wks 02/25/2021: Completed 2 weeks of treatment   diphenhydrAMINE (BENADRYL) 25 MG tablet Take 25 mg by mouth every 6 (six) hours as needed.    EPINEPHrine 0.3 mg/0.3 mL IJ SOAJ injection Inject 0.3 mg into the muscle as needed for anaphylaxis.    famotidine (PEPCID) 40 MG tablet Take 1 tablet (40 mg total) by mouth daily. 03/25/2021: Twice a day per dermatology instructions   fexofenadine (ALLEGRA) 180 MG tablet Take 2 tablets by mouth every morning.    FIBER ADULT GUMMIES PO Take by mouth.    fluconazole (DIFLUCAN) 150 MG tablet Take 1 tablet (150 mg total) by mouth  once a week. X 2-4 weeks    FLUoxetine (PROZAC) 20 MG tablet Take 20 mg by mouth daily.    fluPHENAZine (PROLIXIN) 2.5 MG tablet Take 1 tablet (2.5 mg total) by mouth at bedtime. (Patient taking differently: Take 2.5 mg by mouth at bedtime. Take with 5 mg to equal 7.5 mg)    fluPHENAZine (PROLIXIN) 5 MG tablet Take 5 mg by mouth daily. Take with 2.5 mg to equal 7.5 mg    fluticasone (FLONASE) 50 MCG/ACT nasal spray Place 2 sprays into both nostrils daily.    hydrocortisone 2.5 % ointment Apply topically 2 (two) times daily. Left  lower abdomen/groin    hydrOXYzine (ATARAX/VISTARIL) 25 MG tablet Take 1 tablet (25 mg total) by mouth every 8 (eight) hours as needed.    Melatonin 10 MG TABS Take by mouth at bedtime.    meloxicam (MOBIC) 15 MG tablet Take 15 mg by mouth daily. 03/25/2021: Reports taking as needed   metoprolol succinate (TOPROL-XL) 25 MG 24 hr tablet TAKE 1 TABLET BY MOUTH ONCE DAILY WITH  OR  IMMEDIATELY  FOLLOWING  A  MEAL    montelukast (SINGULAIR) 10 MG tablet Take 1 tablet (10 mg total) by mouth daily.    Multiple Vitamins-Minerals (ALIVE MULTI-VITAMIN PO) Take by mouth daily in the afternoon.    mupirocin ointment (BACTROBAN) 2 % Apply 1 application topically 3 (three) times daily. Scalp    mupirocin ointment (BACTROBAN) 2 % Apply 1 application topically 2 (two) times daily.    omalizumab Arvid Right) 150 MG/ML prefilled syringe Inject 300 mg into the skin every 14 (fourteen) days.    oxyCODONE-acetaminophen (PERCOCET) 10-325 MG tablet Take 1 tablet by mouth 3 (three) times daily as needed.    Probiotic Product (PROBIOTIC DAILY PO) Take by mouth. probiogen probiotic daily    QUEtiapine (SEROQUEL XR) 300 MG 24 hr tablet Take 300 mg by mouth daily.    topiramate (TOPAMAX) 200 MG tablet Take 200 mg by mouth daily.    VICTOZA 18 MG/3ML SOPN Inject 1.8 mg into the skin daily. (Patient not taking: Reported on 04/22/2021) 04/22/2021: Medication on hold per patient, states provider told her to hold for now   zinc oxide 20 % ointment Apply 1 application topically 2 (two) times daily as needed for irritation.    No facility-administered encounter medications on file as of 04/22/2021.    Patient Active Problem List   Diagnosis Date Noted   Abnormal MRI, lumbar spine 02/12/2021   Incontinence of feces 02/12/2021   Intertrigo 02/12/2021   Arthritis of right knee 10/25/2020   Aperistalsis, esophagus 10/25/2020   Insomnia 10/11/2020   Elevated liver enzymes 08/17/2020   Prediabetes 07/24/2020   Arthritis of both knees  06/08/2020   Annual physical exam 04/20/2020   Ingrown right big toenail 04/20/2020   Gastroesophageal reflux disease 04/20/2020   Pain and swelling of right knee 04/20/2020   Mitral valve insufficiency 01/10/2020   Bacterial vaginosis 10/20/2019   Encounter for screening colonoscopy    Hives 02/23/2019   Urticaria 02/23/2019   Vitamin D deficiency 02/01/2019   Mood disorder ([place]) 12/24/2018   HLD (hyperlipidemia) 09/01/2018   History of prediabetes 09/01/2018   Chronic pain 08/31/2018   Allergic rhinitis 08/31/2018   Lumbosacral radiculitis 02/19/2018   S/P laparoscopic cholecystectomy April 2019 01/12/2018   Angioedema 07/07/2017   Bilateral carpal tunnel syndrome 11/03/2016   Frequent PVCs 09/24/2016   Morbid obesity with BMI of 40.0-44.9, adult ([place]) 08/07/2016   Essential hypertension 07/10/2016  OSA on CPAP 07/10/2016    Conditions to be addressed/monitored:HTN and Malnutrition  Care Plan : Wellness (Adult)  Updates made by Leona Singleton, RN since 04/22/2021 12:00 AM     Problem: Healthy Nutrition (Wellness)   Priority: Medium     Long-Range Goal: Patient will report eating atleast 2-3 meals a day in the next 180 days   Start Date: 02/25/2021  Expected End Date: 09/04/2021  This Visit's Progress: Not on track  Recent Progress: Not on track  Priority: Medium  Note:   Current Barriers:  Ineffective Self Health Maintenance; patient reporting only eating about one meal a day due to decrease in appetite.  Continues to report eating only one meal a day.  Has history of Aperistalsis Esophagus with difficulties swallowing pills and some foods. States her foods have to be pureed and they are just not pleasing to her.  Clinical Goal(s):  Collaboration with McLean-Scocuzza, Nino Glow, MD regarding development and update of comprehensive plan of care as evidenced by provider attestation and co-signature Inter-disciplinary care team collaboration (see longitudinal plan of  care) patient will work with care management team to address care coordination and chronic disease management needs related to Disease Management and Educational Needs   Interventions:  Evaluation of current treatment plan related to  decreased nutrition/Aperistalsis Esophagus ,  due to decreased appetite and knowledge deficit related to adequate nutrition  self-management and patient's adherence to plan as established by provider. Collaboration with McLean-Scocuzza, Nino Glow, MD regarding development and update of comprehensive plan of care as evidenced by provider attestation       and co-signature Inter-disciplinary care team collaboration (see longitudinal plan of care) Discussed plans with patient for ongoing care management follow up and provided patient with direct contact information for care management team Reviewed current dietary intake and exercise levels and encouraged small steps toward making change to eating and exercising increasing activity as tolerated Encouraged patient to discuss decreased appetite and meal intake with providers Discussed with patient need to eat at least 3 meals a day or 5-6 small meals a day for adequate nutrition and appropriate metabolism Incremental change encouraged Making healthy food choices encouraged Confirmed with patient, history of aperistalsis esophagus not the reason for decreased nutrition; per patient it is not, just decreased in appetite Sent EMMI video on nutrition and diabetes Discussed importance of nutrition related to health Reviewed and encouraged healthier meal and drink options Reviewed medications and indications and encouraged medication compliance Discussed importance of balanced nutrition and encouraged patient to consider drinking Glucerna Shakes or fruit/vegetable smoothies to help with nutrition while balancing swallowing issues Patient Goals/Self Care Activities: Fill half of plate with vegetables Keep a food diary Set a  realistic goal  Try to drink/incorporate Glucerna Shakes or fruit/vegetable smoothies into your daily diet routine Eat at least 3 meals day Follow Up Plan: The care management team will reach out to the patient again over the next 45 business days.      Care Plan : Hypertension (Adult)  Updates made by Leona Singleton, RN since 04/22/2021 12:00 AM     Problem: Hypertension (Hypertension)   Priority: Medium     Long-Range Goal: Hypertension Monitored   Start Date: 02/25/2021  Expected End Date: 10/04/2021  This Visit's Progress: On track  Recent Progress: Not on track  Priority: Medium  Note:   Objective:  Last practice recorded BP readings:  BP Readings from Last 3 Encounters:  02/15/21 117/77  02/14/21 122/78  02/12/21  124/70  Current Barriers:  Knowledge Deficits related to basic understanding of hypertension pathophysiology and self care management as evidenced by patient reporting blood pressures at home runs 150/70's.  Patient reporting blood pressure 155/75 this morning with ranges of 150/70's.  Patient reporting cough for the last 3 weeks.  States she has tried over the counter cough medicine and it has not helped. Encouraged to contact PCP for suggestions. Case Manager Clinical Goal(s):  patient will verbalize understanding of plan for hypertension management patient will attend all scheduled medical appointments: 8/10 with PCP patient will demonstrate improved adherence to prescribed treatment plan for hypertension as evidenced by taking all medications as prescribed, monitoring and recording blood pressure as directed, adhering to low sodium/DASH diet Interventions:  Collaboration with McLean-Scocuzza, Nino Glow, MD regarding development and update of comprehensive plan of care as evidenced by provider attestation and co-signature Inter-disciplinary care team collaboration (see longitudinal plan of care) Encouraged continued use of home blood pressure monitoring and  recording in blood pressure log; include symptoms of hypotension or potential medication side effects in log Shared overall cardiovascular risk with patient; encourage changes to lifestyle risk factors, including alcohol consumption, smoking, inadequate exercise, poor dietary habits and stress Evaluation of current treatment plan related to hypertension self management and patient's adherence to plan as established by provider. Provided education to patient re: stroke prevention, s/s of heart attack and stroke, DASH diet, complications of uncontrolled blood pressure Reviewed medications with patient and discussed importance of compliance Discussed plans with patient for ongoing care management follow up and provided patient with direct contact information for care management team Advised patient, providing education and rationale, to monitor blood pressure 3 times a week and record, calling PCP for findings outside established parameters.  Home blood pressure monitoring encouraged; discussed with patient seeking assistance from husband in checking her blood pressures at home Encouraged to attend all scheduled provider appointments  Encouraged to call provider office for new concerns, questions, or BP outside discussed parameters   Discussed and encouraged to follow a low sodium diet/DASH diet Sent education on hypertension and encouraged patient to review information Sent 2022 [place] to help with logging blood pressures; encouraged patient to use booklet to document blood pressures Encouraged patient to contact provider for any increase of blood pressures >160's Discussed importance of hypertension management/blood pressure control and dangers of hypertension Patient Goals/Self-Care Activities: Check blood pressure 3 times per week Ask husband to help you check your blood pressure Write blood pressure results in [place] Take log to providers office for review Review  education sent to MyChart  Follow Up Plan: The care management team will reach out to the patient again over the next 45 business days.         Plan:The care management team will reach out to the patient again over the next 45 business days.  Hubert Azure RN, MSN RN Care Management Coordinator [place] [phone] [email or web address]

## 2021-04-22 NOTE — Patient Instructions (Signed)
Visit Information  PATIENT GOALS:  Goals Addressed             This Visit's Progress    (RNCM) Eat Healthy   Not on track    Timeframe:  Long-Range Goal Priority:  Medium Start Date:   02/25/21                          Expected End Date: 09/04/21                      Follow Up Date 05/27/21    Fill half of plate with vegetables Keep a food diary Set a realistic goal  Try to drink/incorporate Glucerna Shakes or fruit/vegetable smoothies into your daily diet routine Eat at least 3 meals day   Why is this important?   When you are ready to manage your nutrition or weight, having a plan and setting goals will help.  Taking small steps to change how you eat and exercise is a good place to start.    Notes:      (RNCM) Track and Manage My Blood Pressure-Hypertension   On track    Timeframe:  Long-Range Goal Priority:  Medium Start Date:  02/25/21                           Expected End Date: 09/04/21                      Follow Up Date 05/27/21    Check blood pressure 3 times per week Ask husband to help you check your blood pressure Write blood pressure results in [place] Take log to providers office for review Review education sent to MyChart    Why is this important?   You won't feel high blood pressure, but it can still hurt your blood vessels.  High blood pressure can cause heart or kidney problems. It can also cause a stroke.  Making lifestyle changes like losing a little weight or eating less salt will help.  Checking your blood pressure at home and at different times of the day can help to control blood pressure.  If the doctor prescribes medicine remember to take it the way the doctor ordered.  Call the office if you cannot afford the medicine or if there are questions about it.     Notes:         Patient verbalizes understanding of instructions provided today and agrees to view in [place].   The care management team will reach out to the patient  again over the next 45 business days.   Hubert Azure RN, MSN RN Care Management Coordinator [place] [phone] [email or web address]

## 2021-05-01 ENCOUNTER — Telehealth: Payer: Self-pay | Admitting: Internal Medicine

## 2021-05-01 ENCOUNTER — Telehealth (INDEPENDENT_AMBULATORY_CARE_PROVIDER_SITE_OTHER): Payer: Medicare Other | Admitting: Internal Medicine

## 2021-05-01 DIAGNOSIS — L304 Erythema intertrigo: Secondary | ICD-10-CM | POA: Diagnosis not present

## 2021-05-01 DIAGNOSIS — U071 COVID-19: Secondary | ICD-10-CM | POA: Diagnosis not present

## 2021-05-01 DIAGNOSIS — J40 Bronchitis, not specified as acute or chronic: Secondary | ICD-10-CM | POA: Diagnosis not present

## 2021-05-01 DIAGNOSIS — S81001A Unspecified open wound, right knee, initial encounter: Secondary | ICD-10-CM

## 2021-05-01 MED ORDER — DM-GUAIFENESIN ER 60-1200 MG PO TB12: 1.0000 | ORAL_TABLET | Freq: Two times a day (BID) | ORAL | 0 refills | Status: DC

## 2021-05-01 MED ORDER — ALBUTEROL SULFATE HFA 108 (90 BASE) MCG/ACT IN AERS: 1.0000 | INHALATION_SPRAY | Freq: Four times a day (QID) | RESPIRATORY_TRACT | 0 refills | Status: DC | PRN

## 2021-05-01 MED ORDER — MUPIROCIN 2 % EX OINT: 1.0000 "application " | TOPICAL_OINTMENT | Freq: Two times a day (BID) | CUTANEOUS | 2 refills | Status: DC

## 2021-05-01 MED ORDER — GUAIFENESIN-CODEINE 100-10 MG/5ML PO SYRP: 5.0000 mL | ORAL_SOLUTION | Freq: Every evening | ORAL | 0 refills | Status: DC | PRN

## 2021-05-01 MED ORDER — MOLNUPIRAVIR EUA 200MG CAPSULE: 4.0000 | ORAL_CAPSULE | Freq: Two times a day (BID) | ORAL | 0 refills | Status: AC

## 2021-05-01 NOTE — Telephone Encounter (Signed)
Attempting to start patient's virtual visit. Called Patient at 2:00 pm as Dr Olivia Mackie McLean-Scocuzza would like for her to go and be Covid tested.   Over the course of the last 45 minutes have called the patient 6 times and been on the phone with her three of those times. Patient will be unable to hear me and then the phone cuts out. Will call the patient back and get a busy signal. When calling home number no one answers.   Was able to inform Patient she needed to be tested for Covid. Was informed that she was around a sick little girl whose father had Covid. Patient's symptoms started Sunday. Unable to find out what exact symptoms are as phone cut out again and patient is not answering return calls.

## 2021-05-02 NOTE — Telephone Encounter (Signed)
Seen telephone 05/01/21

## 2021-05-02 NOTE — Progress Notes (Signed)
Telephone Note  I connected with Heather Sweeney  on 05/01/21 at  7 PM EDT by a telephone and verified that I am speaking with the correct person using two identifiers.  Location patient: home, [place] Location provider:work or home office Persons participating in the virtual visit: patient, provider  I discussed the limitations of evaluation and management by telemedicine and the availability of in person appointments. The patient expressed understanding and agreed to proceed.   HPI:  Acute telemedicine visit for : Covid sxs since Monday hoarse voice, sore throat, coughing tried mucinex dm around small child relative tested + covid 19   Also fall a church "Sunday scrapping right knee   -COVID-19 vaccine status:  ROS: See pertinent positives and negatives per HPI.  Past Medical History:  Diagnosis Date   Allergic rhinitis    Asthma    Bipolar disorder (HCC)    Chronic low back pain    CTS (carpal tunnel syndrome)    hands   Depression    Gallstones    GERD (gastroesophageal reflux disease)    History of prediabetes    Hypertension    Laceration of right foot    Onychomycosis    OSA on CPAP    Overactive bladder    PVC's (premature ventricular contractions)    PVC's (premature ventricular contractions)    Dr. K cards   Seizures (HCC)    8th grade    Past Surgical History:  Procedure Laterality Date   ABDOMINAL HYSTERECTOMY  07/08/2011   ABDOMINAL HYSTERECTOMY     supracervical in 07/2011    CARPAL TUNNEL RELEASE     right hand 1996   CESAREAN SECTION     2012   CHOLECYSTECTOMY N/A 01/12/2018   Procedure: LAPAROSCOPIC CHOLECYSTECTOMY WITH INTRAOPERATIVE CHOLANGIOGRAM ERAS PATHWAY;  Surgeon: Martin, Matthew, MD;  Location: [facility];  Service: General;  Laterality: N/A;   COLONOSCOPY WITH PROPOFOL N/A 04/15/2019   Procedure: COLONOSCOPY WITH PROPOFOL;  Surgeon: Anna, Kiran, MD;  Location: [facility];  Service: Gastroenterology;  Laterality: N/A;   DILATION AND CURETTAGE  OF UTERUS     04/2011 benign endometrial polyp squamous metaplasia, inflamm, blood, mucous, benign    ESOPHAGOGASTRODUODENOSCOPY (EGD) WITH PROPOFOL N/A 10/23/2020   Procedure: ESOPHAGOGASTRODUODENOSCOPY (EGD) WITH PROPOFOL;  Surgeon: Anna, Kiran, MD;  Location: [facility];  Service: Gastroenterology;  Laterality: N/A;   FLEXIBLE SIGMOIDOSCOPY N/A 02/15/2021   Procedure: FLEXIBLE SIGMOIDOSCOPY;  Surgeon: Anna, Kiran, MD;  Location: [facility];  Service: Gastroenterology;  Laterality: N/A;   FOOT SURGERY     Dr. Mayer    LEEP     20"$ 06   TUBAL LIGATION       Current Outpatient Medications:    albuterol (VENTOLIN HFA) 108 (90 Base) MCG/ACT inhaler, Inhale 1-2 puffs into the lungs every 6 (six) hours as needed for wheezing or shortness of breath. cough, Disp: 18 g, Rfl: 0   Dextromethorphan-Guaifenesin 60-1200 MG 12hr tablet, Take 1 tablet by mouth every 12 (twelve) hours. Prn cough green label, Disp: 30 tablet, Rfl: 0   guaiFENesin-codeine (ROBITUSSIN AC) 100-10 MG/5ML syrup, Take 5 mLs by mouth at bedtime as needed for cough., Disp: 120 mL, Rfl: 0   molnupiravir EUA 200 mg CAPS, Take 4 capsules (800 mg total) by mouth 2 (two) times daily for 5 days. With food, Disp: 40 capsule, Rfl: 0   amLODipine (NORVASC) 5 MG tablet, Take 1 tablet by mouth once daily, Disp: 90 tablet, Rfl: 0   benztropine (COGENTIN) 1 MG tablet,  Take 1 mg by mouth 2 (two) times daily., Disp: , Rfl:    Biotin w/ Vitamins C & E (HAIR/SKIN/NAILS PO), Take 2,500 mcg by mouth daily., Disp: , Rfl:    Blood Glucose Calibration (TRUE METRIX LEVEL 1) Low SOLN, , Disp: , Rfl:    cetirizine (ZYRTEC ALLERGY) 10 MG tablet, Take 2 tablets (20 mg total) by mouth daily., Disp: 30 tablet, Rfl: 0   clobetasol cream (TEMOVATE) AB-123456789 %, Apply 1 application topically 2 (two) times daily., Disp: , Rfl:    clotrimazole (CLOTRIMAZOLE AF) 1 % cream, Apply 1 application topically 2 (two) times daily., Disp: 60 g, Rfl: 2    clotrimazole-betamethasone (LOTRISONE) cream, Apply externally BID prn sx up to 2 wks, Disp: 15 g, Rfl: 0   diphenhydrAMINE (BENADRYL) 25 MG tablet, Take 25 mg by mouth every 6 (six) hours as needed., Disp: , Rfl:    EPINEPHrine 0.3 mg/0.3 mL IJ SOAJ injection, Inject 0.3 mg into the muscle as needed for anaphylaxis., Disp: 2 each, Rfl: 3   famotidine (PEPCID) 40 MG tablet, Take 1 tablet (40 mg total) by mouth daily., Disp: 90 tablet, Rfl: 1   fexofenadine (ALLEGRA) 180 MG tablet, Take 2 tablets by mouth every morning., Disp: , Rfl:    FIBER ADULT GUMMIES PO, Take by mouth., Disp: , Rfl:    fluconazole (DIFLUCAN) 150 MG tablet, Take 1 tablet (150 mg total) by mouth once a week. X 2-4 weeks, Disp: 4 tablet, Rfl: 0   FLUoxetine (PROZAC) 20 MG tablet, Take 20 mg by mouth daily., Disp: , Rfl:    fluPHENAZine (PROLIXIN) 2.5 MG tablet, Take 1 tablet (2.5 mg total) by mouth at bedtime. (Patient taking differently: Take 2.5 mg by mouth at bedtime. Take with 5 mg to equal 7.5 mg), Disp: , Rfl:    fluPHENAZine (PROLIXIN) 5 MG tablet, Take 5 mg by mouth daily. Take with 2.5 mg to equal 7.5 mg, Disp: , Rfl:    fluticasone (FLONASE) 50 MCG/ACT nasal spray, Place 2 sprays into both nostrils daily., Disp: 48 g, Rfl: 3   furosemide (LASIX) 20 MG tablet, Take 0.5 tablets (10 mg total) by mouth daily as needed., Disp: 30 tablet, Rfl: 3   hydrocortisone 2.5 % ointment, Apply topically 2 (two) times daily. Left lower abdomen/groin, Disp: 30 g, Rfl: 5   hydrOXYzine (ATARAX/VISTARIL) 25 MG tablet, Take 1 tablet (25 mg total) by mouth every 8 (eight) hours as needed., Disp: 90 tablet, Rfl: 3   Melatonin 10 MG TABS, Take by mouth at bedtime., Disp: , Rfl:    meloxicam (MOBIC) 15 MG tablet, Take 15 mg by mouth daily., Disp: , Rfl:    metoprolol succinate (TOPROL-XL) 25 MG 24 hr tablet, TAKE 1 TABLET BY MOUTH ONCE DAILY WITH  OR  IMMEDIATELY  FOLLOWING  A  MEAL, Disp: 90 tablet, Rfl: 0   montelukast (SINGULAIR) 10 MG tablet,  Take 1 tablet (10 mg total) by mouth daily., Disp: 90 tablet, Rfl: 3   Multiple Vitamins-Minerals (ALIVE MULTI-VITAMIN PO), Take by mouth daily in the afternoon., Disp: , Rfl:    mupirocin ointment (BACTROBAN) 2 %, Apply 1 application topically 2 (two) times daily. Prn right knee wound, Disp: 30 g, Rfl: 2   omalizumab (XOLAIR) 150 MG/ML prefilled syringe, Inject 300 mg into the skin every 14 (fourteen) days., Disp: , Rfl:    oxyCODONE-acetaminophen (PERCOCET) 10-325 MG tablet, Take 1 tablet by mouth 3 (three) times daily as needed., Disp: , Rfl:  Probiotic Product (PROBIOTIC DAILY PO), Take by mouth. probiogen probiotic daily, Disp: , Rfl:    QUEtiapine (SEROQUEL XR) 300 MG 24 hr tablet, Take 300 mg by mouth daily., Disp: , Rfl:    topiramate (TOPAMAX) 200 MG tablet, Take 200 mg by mouth daily., Disp: , Rfl:    VICTOZA 18 MG/3ML SOPN, Inject 1.8 mg into the skin daily. (Patient not taking: Reported on 04/22/2021), Disp: 27 mL, Rfl: 1   zinc oxide 20 % ointment, Apply 1 application topically 2 (two) times daily as needed for irritation., Disp: 60 g, Rfl: 2  EXAM:  VITALS per patient if applicable:  GENERAL: alert, oriented, appears well and in no acute distress  PSYCH/NEURO: pleasant and cooperative, no obvious depression or anxiety, speech and thought processing grossly intact  ASSESSMENT AND PLAN:  Discussed the following assessment and plan:  COVID-19 - Plan: Dextromethorphan-Guaifenesin 60-1200 MG 12hr tablet, molnupiravir EUA 200 mg CAPS, guaiFENesin-codeine (ROBITUSSIN AC) 100-10 MG/5ML syrup, albuterol (VENTOLIN HFA) 108 (90 Base) MCG/ACT inhaler  Bronchitis due to COVID-19 virus - Plan: Dextromethorphan-Guaifenesin 60-1200 MG 12hr tablet, molnupiravir EUA 200 mg CAPS, guaiFENesin-codeine (ROBITUSSIN AC) 100-10 MG/5ML syrup, albuterol (VENTOLIN HFA) 108 (90 Base) MCG/ACT inhaler  Intertrigo  Open wound of right knee, initial encounter - Plan: mupirocin ointment (BACTROBAN) 2  %  -we discussed possible serious and likely etiologies, options for evaluation and workup, limitations of telemedicine visit vs in person visit, treatment, treatment risks and precautions. Pt prefers to treat via telemedicine empirically rather than in person at this moment.   I discussed the assessment and treatment plan with the patient. The patient was provided an opportunity to ask questions and all were answered. The patient agreed with the plan and demonstrated an understanding of the instructions.    Time spent 20 min Delorise Jackson, MD

## 2021-05-08 ENCOUNTER — Telehealth: Payer: Self-pay | Admitting: Pharmacist

## 2021-05-08 ENCOUNTER — Ambulatory Visit: Payer: Medicare Other

## 2021-05-08 NOTE — Telephone Encounter (Signed)
  Chronic Care Management   Note  05/08/2021 Name: Heather Sweeney MRN: [id number] DOB: 05-02-1966   Patient had a video visit for COVID-19 symptoms last week. Contacted today, rescheduled our follow up face to face appointment for later this month. Patient notes she is feeling better, she took a rapid home COVID test last night and was negative.  Catie [name], PharmD, [place], [place] Clinical Pharmacist [employer] [facility] [place]

## 2021-05-13 ENCOUNTER — Other Ambulatory Visit: Payer: Medicare Other

## 2021-05-15 ENCOUNTER — Ambulatory Visit: Payer: Medicare Other | Admitting: Internal Medicine

## 2021-05-17 DIAGNOSIS — M1711 Unilateral primary osteoarthritis, right knee: Secondary | ICD-10-CM | POA: Diagnosis not present

## 2021-05-27 ENCOUNTER — Ambulatory Visit (INDEPENDENT_AMBULATORY_CARE_PROVIDER_SITE_OTHER): Payer: Medicare Other | Admitting: *Deleted

## 2021-05-27 DIAGNOSIS — I10 Essential (primary) hypertension: Secondary | ICD-10-CM

## 2021-05-27 DIAGNOSIS — K220 Achalasia of cardia: Secondary | ICD-10-CM

## 2021-05-27 NOTE — Chronic Care Management (AMB) (Signed)
Chronic Care Management   CCM RN Visit Note  05/27/2021 Name: Heather Sweeney MRN: [id number] DOB: 1966-05-27  Subjective: Heather Sweeney is a 55 y.o. year old female who is a primary care patient of McLean-Scocuzza, Nino Glow, MD. The care management team was consulted for assistance with disease management and care coordination needs.    Engaged with patient by telephone for follow up visit in response to provider referral for case management and/or care coordination services.   Consent to Services:  The patient was given information about Chronic Care Management services, agreed to services, and gave verbal consent prior to initiation of services.  Please see initial visit note for detailed documentation.   Patient agreed to services and verbal consent obtained.   Assessment: Review of patient past medical history, allergies, medications, health status, including review of consultants reports, laboratory and other test data, was performed as part of comprehensive evaluation and provision of chronic care management services.   SDOH (Social Determinants of Health) assessments and interventions performed:    CCM Care Plan  Allergies  Allergen Reactions   Cephalexin Swelling and Rash   Hctz [Hydrochlorothiazide] Rash and Swelling    Mild she says   Lisinopril Hives and Other (See Comments)    Headache, Low BP, angioedema     Losartan Hives and Swelling    Angioedema   Penicillins Rash    Has patient had a PCN reaction causing immediate rash, facial/tongue/throat swelling, SOB or lightheadedness with hypotension: Yes Has patient had a PCN reaction causing severe rash involving mucus membranes or skin necrosis: No Has patient had a PCN reaction that required hospitalization: No Has patient had a PCN reaction occurring within the last 10 years: No If all of the above answers are "NO", then may proceed with Cephalosporin use.    Bee Venom     Swelling    Metformin And Related      Rash     Outpatient Encounter Medications as of 05/27/2021  Medication Sig Note   albuterol (VENTOLIN HFA) 108 (90 Base) MCG/ACT inhaler Inhale 1-2 puffs into the lungs every 6 (six) hours as needed for wheezing or shortness of breath. cough    amLODipine (NORVASC) 5 MG tablet Take 1 tablet by mouth once daily    benztropine (COGENTIN) 1 MG tablet Take 1 mg by mouth 2 (two) times daily.    Biotin w/ Vitamins C & E (HAIR/SKIN/NAILS PO) Take 2,500 mcg by mouth daily.    Blood Glucose Calibration (TRUE METRIX LEVEL 1) Low SOLN     cetirizine (ZYRTEC ALLERGY) 10 MG tablet Take 2 tablets (20 mg total) by mouth daily.    clobetasol cream (TEMOVATE) AB-123456789 % Apply 1 application topically 2 (two) times daily.    clotrimazole (CLOTRIMAZOLE AF) 1 % cream Apply 1 application topically 2 (two) times daily.    clotrimazole-betamethasone (LOTRISONE) cream Apply externally BID prn sx up to 2 wks 02/25/2021: Completed 2 weeks of treatment   Dextromethorphan-Guaifenesin 60-1200 MG 12hr tablet Take 1 tablet by mouth every 12 (twelve) hours. Prn cough green label    diphenhydrAMINE (BENADRYL) 25 MG tablet Take 25 mg by mouth every 6 (six) hours as needed.    EPINEPHrine 0.3 mg/0.3 mL IJ SOAJ injection Inject 0.3 mg into the muscle as needed for anaphylaxis.    famotidine (PEPCID) 40 MG tablet Take 1 tablet (40 mg total) by mouth daily. 03/25/2021: Twice a day per dermatology instructions   fexofenadine (ALLEGRA) 180 MG tablet Take  2 tablets by mouth every morning.    FIBER ADULT GUMMIES PO Take by mouth.    fluconazole (DIFLUCAN) 150 MG tablet Take 1 tablet (150 mg total) by mouth once a week. X 2-4 weeks    FLUoxetine (PROZAC) 20 MG tablet Take 20 mg by mouth daily.    fluPHENAZine (PROLIXIN) 2.5 MG tablet Take 1 tablet (2.5 mg total) by mouth at bedtime. (Patient taking differently: Take 2.5 mg by mouth at bedtime. Take with 5 mg to equal 7.5 mg)    fluPHENAZine (PROLIXIN) 5 MG tablet Take 5 mg by mouth daily.  Take with 2.5 mg to equal 7.5 mg    fluticasone (FLONASE) 50 MCG/ACT nasal spray Place 2 sprays into both nostrils daily.    furosemide (LASIX) 20 MG tablet Take 0.5 tablets (10 mg total) by mouth daily as needed.    guaiFENesin-codeine (ROBITUSSIN AC) 100-10 MG/5ML syrup Take 5 mLs by mouth at bedtime as needed for cough.    hydrocortisone 2.5 % ointment Apply topically 2 (two) times daily. Left lower abdomen/groin    hydrOXYzine (ATARAX/VISTARIL) 25 MG tablet Take 1 tablet (25 mg total) by mouth every 8 (eight) hours as needed.    Melatonin 10 MG TABS Take by mouth at bedtime.    meloxicam (MOBIC) 15 MG tablet Take 15 mg by mouth daily. 03/25/2021: Reports taking as needed   metoprolol succinate (TOPROL-XL) 25 MG 24 hr tablet TAKE 1 TABLET BY MOUTH ONCE DAILY WITH  OR  IMMEDIATELY  FOLLOWING  A  MEAL    montelukast (SINGULAIR) 10 MG tablet Take 1 tablet (10 mg total) by mouth daily.    Multiple Vitamins-Minerals (ALIVE MULTI-VITAMIN PO) Take by mouth daily in the afternoon.    mupirocin ointment (BACTROBAN) 2 % Apply 1 application topically 2 (two) times daily. Prn right knee wound    omalizumab Arvid Right) 150 MG/ML prefilled syringe Inject 300 mg into the skin every 14 (fourteen) days.    oxyCODONE-acetaminophen (PERCOCET) 10-325 MG tablet Take 1 tablet by mouth 3 (three) times daily as needed.    Probiotic Product (PROBIOTIC DAILY PO) Take by mouth. probiogen probiotic daily    QUEtiapine (SEROQUEL XR) 300 MG 24 hr tablet Take 300 mg by mouth daily.    topiramate (TOPAMAX) 200 MG tablet Take 200 mg by mouth daily.    VICTOZA 18 MG/3ML SOPN Inject 1.8 mg into the skin daily. (Patient not taking: Reported on 04/22/2021) 04/22/2021: Medication on hold per patient, states provider told her to hold for now   zinc oxide 20 % ointment Apply 1 application topically 2 (two) times daily as needed for irritation.    No facility-administered encounter medications on file as of 05/27/2021.    Patient Active  Problem List   Diagnosis Date Noted   Abnormal MRI, lumbar spine 02/12/2021   Incontinence of feces 02/12/2021   Intertrigo 02/12/2021   Arthritis of right knee 10/25/2020   Aperistalsis, esophagus 10/25/2020   Insomnia 10/11/2020   Elevated liver enzymes 08/17/2020   Prediabetes 07/24/2020   Arthritis of both knees 06/08/2020   Annual physical exam 04/20/2020   Ingrown right big toenail 04/20/2020   Gastroesophageal reflux disease 04/20/2020   Pain and swelling of right knee 04/20/2020   Mitral valve insufficiency 01/10/2020   Bacterial vaginosis 10/20/2019   Encounter for screening colonoscopy    Hives 02/23/2019   Urticaria 02/23/2019   Vitamin D deficiency 02/01/2019   Mood disorder ([place]) 12/24/2018   HLD (hyperlipidemia) 09/01/2018   History  of prediabetes 09/01/2018   Chronic pain 08/31/2018   Allergic rhinitis 08/31/2018   Lumbosacral radiculitis 02/19/2018   S/P laparoscopic cholecystectomy April 2019 01/12/2018   Angioedema 07/07/2017   Bilateral carpal tunnel syndrome 11/03/2016   Frequent PVCs 09/24/2016   Morbid obesity with BMI of 40.0-44.9, adult ([place]) 08/07/2016   Essential hypertension 07/10/2016   OSA on CPAP 07/10/2016    Conditions to be addressed/monitored:HTN and Nutrition  Care Plan : Wellness (Adult)  Updates made by Leona Singleton, RN since 05/27/2021 12:00 AM     Problem: Healthy Nutrition (Wellness)   Priority: Medium     Long-Range Goal: Patient will report eating atleast 2-3 meals a day in the next 180 days   Start Date: 02/25/2021  Expected End Date: 12/03/2021  This Visit's Progress: Not on track  Recent Progress: Not on track  Priority: Medium  Note:   Current Barriers:  Ineffective Self Health Maintenance; patient reporting only eating about one meal a day due to decrease in appetite.  Continues to report eating only one meal a day.  Has history of Aperistalsis Esophagus with difficulties swallowing pills and some foods. States her  foods have to be pureed and they are just not pleasing to her.  Admits to being scared of choking at times. Clinical Goal(s):  Collaboration with McLean-Scocuzza, Nino Glow, MD regarding development and update of comprehensive plan of care as evidenced by provider attestation and co-signature Inter-disciplinary care team collaboration (see longitudinal plan of care) patient will work with care management team to address care coordination and chronic disease management needs related to Disease Management and Educational Needs   Interventions:  Evaluation of current treatment plan related to  decreased nutrition/Aperistalsis Esophagus ,  due to decreased appetite and knowledge deficit related to adequate nutrition  self-management and patient's adherence to plan as established by provider. Collaboration with McLean-Scocuzza, Nino Glow, MD regarding development and update of comprehensive plan of care as evidenced by provider attestation       and co-signature Inter-disciplinary care team collaboration (see longitudinal plan of care) Discussed plans with patient for ongoing care management follow up and provided patient with direct contact information for care management team Reviewed current dietary intake and exercise levels and encouraged small steps toward making change to eating and exercising increasing activity as tolerated Encouraged patient to discuss decreased appetite and meal intake with providers Discussed with patient need to eat at least 3 meals a day or 5-6 small meals a day for adequate nutrition and appropriate metabolism Incremental change encouraged Making healthy food choices encouraged Confirmed with patient, history of aperistalsis esophagus not the reason for decreased nutrition; per patient it is not, just decreased in appetite Sent EMMI video on nutrition and diabetes Discussed importance of nutrition related to health Reviewed and encouraged healthier meal and drink  options Reviewed medications and indications and encouraged medication compliance Discussed importance of balanced nutrition and encouraged patient to consider drinking Glucerna/Boost Shakes or fruit/vegetable smoothies to help with nutrition while balancing swallowing issues Patient Goals/Self Care Activities: Fill half of plate with vegetables Keep a food diary Set a realistic goal  Try to drink/incorporate Glucerna/Boost Shakes or fruit/vegetable smoothies into your daily diet routine Eat at least 3 meals day Follow Up Plan: The care management team will reach out to the patient again over the next 45 business days.      Care Plan : Hypertension (Adult)  Updates made by Leona Singleton, RN since 05/27/2021 12:00 AM  Problem: Hypertension (Hypertension)   Priority: Medium     Long-Range Goal: Hypertension Monitored   Start Date: 02/25/2021  Expected End Date: 10/04/2021  Recent Progress: On track  Priority: Medium  Note:   Objective:  Last practice recorded BP readings:  BP Readings from Last 3 Encounters:  02/15/21 117/77  02/14/21 122/78  02/12/21 124/70  Current Barriers:  Knowledge Deficits related to basic understanding of hypertension pathophysiology and self care management as evidenced by patient reporting blood pressures at home runs 150/70's.  Patient reporting blood pressure 128/72 this morning with recent ranges up to 180/80-90's.  Patient reporting checking blood pressures with manual cuff, her and husband, but she is not 100% sure.  Denies any lightheadedness or dizziness.  Reports compliance with all her medications and reports her blood pressures are usually checked in the evenings. Case Manager Clinical Goal(s):  patient will verbalize understanding of plan for hypertension management patient will attend all scheduled medical appointments: 8/10 with PCP patient will demonstrate improved adherence to prescribed treatment plan for hypertension as evidenced by  taking all medications as prescribed, monitoring and recording blood pressure as directed, adhering to low sodium/DASH diet Interventions:  Collaboration with McLean-Scocuzza, Nino Glow, MD regarding development and update of comprehensive plan of care as evidenced by provider attestation and co-signature Inter-disciplinary care team collaboration (see longitudinal plan of care) Encouraged continued use of home blood pressure monitoring and recording in blood pressure log; include symptoms of hypotension or potential medication side effects in log Shared overall cardiovascular risk with patient; encourage changes to lifestyle risk factors, including alcohol consumption, smoking, inadequate exercise, poor dietary habits and stress Evaluation of current treatment plan related to hypertension self management and patient's adherence to plan as established by provider. Provided education to patient re: stroke prevention, s/s of heart attack and stroke, DASH diet, complications of uncontrolled blood pressure Reviewed medications with patient and discussed importance of compliance Discussed plans with patient for ongoing care management follow up and provided patient with direct contact information for care management team Advised patient, providing education and rationale, to monitor blood pressure 3 times a week and record, calling PCP for findings outside established parameters.  Home blood pressure monitoring encouraged; discussed with patient seeking assistance from husband in checking her blood pressures at home Encouraged to attend all scheduled provider appointments  Encouraged to call provider office for new concerns, questions, or BP outside discussed parameters   Discussed and encouraged to follow a low sodium diet/DASH diet Sent education on hypertension and encouraged patient to review information Sent 2022 [place] to help with logging blood pressures; encouraged patient to use  booklet to document blood pressures Encouraged patient to contact provider for any increase of blood pressures >160's Discussed importance of hypertension management/blood pressure control and dangers of hypertension Discussed and instructed patient to take home cuff to next in person appointment to have staff review for accuracy Instructed to review current blood pressure log with provider and pharmacist Patient Goals/Self-Care Activities: Check blood pressure 3 times per week Ask husband to help you check your blood pressure Write blood pressure results in [place] Take log to providers office for review Take cuff and equipment to next appointment with CCM Pharmacist for accuracy Review education sent to MyChart  Follow Up Plan: The care management team will reach out to the patient again over the next 45 business days.         Plan:The care management team will reach out to the  patient again over the next 45 business  days.  Hubert Azure RN, MSN RN Care Management Coordinator [place] [phone] [email or web address]

## 2021-05-27 NOTE — Patient Instructions (Signed)
Visit Information  PATIENT GOALS:  Goals Addressed             This Visit's Progress    (RNCM) Eat Healthy   Not on track    Timeframe:  Long-Range Goal Priority:  Medium Start Date:   02/25/21                          Expected End Date: 12/03/21                  Follow Up Date 06/28/21    Fill half of plate with vegetables Keep a food diary Set a realistic goal  Try to drink/incorporate Glucerna/Boost Shakes or fruit/vegetable smoothies into your daily diet routine Eat at least 3 meals day   Why is this important?   When you are ready to manage your nutrition or weight, having a plan and setting goals will help.  Taking small steps to change how you eat and exercise is a good place to start.    Notes:      (RNCM) Track and Manage My Blood Pressure-Hypertension   Not on track    Timeframe:  Long-Range Goal Priority:  Medium Start Date:  02/25/21                           Expected End Date: 12/03/21                     Follow Up Date 06/28/21    Check blood pressure 3 times per week Ask husband to help you check your blood pressure Write blood pressure results in [place] Take log to providers office for review Take cuff and equipment to next appointment with CCM Pharmacist for accuracy Review education sent to MyChart    Why is this important?   You won't feel high blood pressure, but it can still hurt your blood vessels.  High blood pressure can cause heart or kidney problems. It can also cause a stroke.  Making lifestyle changes like losing a little weight or eating less salt will help.  Checking your blood pressure at home and at different times of the day can help to control blood pressure.  If the doctor prescribes medicine remember to take it the way the doctor ordered.  Call the office if you cannot afford the medicine or if there are questions about it.     Notes:         Patient verbalizes understanding of instructions provided today and  agrees to view in [place].   The care management team will reach out to the patient again over the next 45 business days.   Hubert Azure RN, MSN RN Care Management Coordinator [place] [phone] [name]

## 2021-05-28 DIAGNOSIS — G894 Chronic pain syndrome: Secondary | ICD-10-CM | POA: Diagnosis not present

## 2021-05-28 DIAGNOSIS — Z79891 Long term (current) use of opiate analgesic: Secondary | ICD-10-CM | POA: Diagnosis not present

## 2021-05-28 DIAGNOSIS — M797 Fibromyalgia: Secondary | ICD-10-CM | POA: Diagnosis not present

## 2021-05-28 DIAGNOSIS — Z5181 Encounter for therapeutic drug level monitoring: Secondary | ICD-10-CM | POA: Diagnosis not present

## 2021-05-28 DIAGNOSIS — M25569 Pain in unspecified knee: Secondary | ICD-10-CM | POA: Diagnosis not present

## 2021-05-28 DIAGNOSIS — M5451 Vertebrogenic low back pain: Secondary | ICD-10-CM | POA: Diagnosis not present

## 2021-05-28 DIAGNOSIS — M47816 Spondylosis without myelopathy or radiculopathy, lumbar region: Secondary | ICD-10-CM | POA: Diagnosis not present

## 2021-05-29 ENCOUNTER — Ambulatory Visit: Payer: Medicare Other | Admitting: Pharmacist

## 2021-05-29 ENCOUNTER — Other Ambulatory Visit: Payer: Self-pay

## 2021-05-29 DIAGNOSIS — K220 Achalasia of cardia: Secondary | ICD-10-CM

## 2021-05-29 DIAGNOSIS — F39 Unspecified mood [affective] disorder: Secondary | ICD-10-CM

## 2021-05-29 DIAGNOSIS — M1711 Unilateral primary osteoarthritis, right knee: Secondary | ICD-10-CM

## 2021-05-29 DIAGNOSIS — R7303 Prediabetes: Secondary | ICD-10-CM

## 2021-05-29 DIAGNOSIS — J309 Allergic rhinitis, unspecified: Secondary | ICD-10-CM

## 2021-05-29 DIAGNOSIS — I10 Essential (primary) hypertension: Secondary | ICD-10-CM

## 2021-05-29 DIAGNOSIS — R600 Localized edema: Secondary | ICD-10-CM

## 2021-05-29 NOTE — Chronic Care Management (AMB) (Addendum)
Chronic Care Management Pharmacy Note  05/29/2021 Name:  WELLS GERDEMAN MRN:  [id number] DOB:  1966-08-09   Subjective: [name] is an 55 y.o. year old female who is a primary patient of McLean-Scocuzza, Nino Glow, MD.  Collaborating with covering provider, Tommi Rumps MD, wihle provider is on leave. The CCM team was consulted for assistance with disease management and care coordination needs.    Engaged with patient face to face for follow up visit in response to provider referral for pharmacy case management and/or care coordination services.   Consent to Services:  The patient was given information about Chronic Care Management services, agreed to services, and gave verbal consent prior to initiation of services.  Please see initial visit note for detailed documentation.   Patient Care Team: McLean-Scocuzza, Nino Glow, MD as PCP - General (Internal Medicine) De Hollingshead, RPH-CPP as Pharmacist (Pharmacist) Leona Singleton, RN as Case Manager   Objective:  Lab Results  Component Value Date   CREATININE 0.96 08/17/2020   CREATININE 1.03 05/21/2020   CREATININE 0.98 11/21/2019    Lab Results  Component Value Date   HGBA1C 5.3 05/19/2019   Last diabetic Eye exam: No results found for: HMDIABEYEEXA  Last diabetic Foot exam: No results found for: HMDIABFOOTEX      Component Value Date/Time   CHOL 127 05/21/2020 0923   TRIG 52.0 05/21/2020 0923   HDL 51.40 05/21/2020 0923   CHOLHDL 2 05/21/2020 0923   VLDL 10.4 05/21/2020 0923   LDLCALC 65 05/21/2020 0923   LDLCALC 84 05/19/2019 1511    Hepatic Function Latest Ref Rng & Units 08/17/2020 05/21/2020 11/21/2019  Total Protein 6.0 - 8.3 g/dL 6.1 6.3 7.2  Albumin 3.5 - 5.2 g/dL 3.8 4.0 4.2  AST 0 - 37 U/L _0 ALT 0 - 35 U/L _1 Alk Phosphatase 39 - 117 U/L 50 65 74  Total Bilirubin 0.2 - 1.2 mg/dL 0.4 0.4 0.3    Lab Results  Component Value Date/Time   TSH 1.23 05/21/2020 09:23 AM    TSH 0.76 02/10/2019 02:59 PM    CBC Latest Ref Rng & Units 05/21/2020 11/21/2019 05/19/2019  WBC 4.0 - 10.5 K/uL 6.2 5.6 5.4  Hemoglobin 12.0 - 15.0 g/dL 12.1 12.2 11.5(L)  Hematocrit 36.0 - 46.0 % 34.8(L) 35.5(L) 33.2(L)  Platelets 150.0 - 400.0 K/uL 184.0 207.0 225    Lab Results  Component Value Date/Time   VD25OH 41 02/10/2019 02:59 PM    Clinical ASCVD: No  The ASCVD Risk score Mikey Bussing DC Jr., et al., 2013) failed to calculate for the following reasons:   The valid total cholesterol range is 130 to 320 mg/dL      Social History   Tobacco Use  Smoking Status Never  Smokeless Tobacco Current   Types: Snuff  Tobacco Comments   Is in process of decreasing amount   BP Readings from Last 3 Encounters:  04/17/21 130/76  02/15/21 117/77  02/14/21 122/78   Pulse Readings from Last 3 Encounters:  04/17/21 78  02/15/21 81  02/14/21 73   Wt Readings from Last 3 Encounters:  04/17/21 239 lb 12.8 oz (108.8 kg)  02/15/21 244 lb (110.7 kg)  02/14/21 244 lb (110.7 kg)    Assessment: Review of patient past medical history, allergies, medications, health status, including review of consultants reports, laboratory and other test data, was performed as part of comprehensive evaluation and provision of chronic care management services.  SDOH:  (Social Determinants of Health) assessments and interventions performed:    CCM Care Plan  Allergies  Allergen Reactions   Cephalexin Swelling and Rash   Hctz [Hydrochlorothiazide] Rash and Swelling    Mild she says   Lisinopril Hives and Other (See Comments)    Headache, Low BP, angioedema     Losartan Hives and Swelling    Angioedema   Penicillins Rash    Has patient had a PCN reaction causing immediate rash, facial/tongue/throat swelling, SOB or lightheadedness with hypotension: Yes Has patient had a PCN reaction causing severe rash involving mucus membranes or skin necrosis: No Has patient had a PCN reaction that required  hospitalization: No Has patient had a PCN reaction occurring within the last 10 years: No If all of the above answers are "NO", then may proceed with Cephalosporin use.    Bee Venom     Swelling    Metformin And Related     Rash     Medications Reviewed Today     Reviewed by De Hollingshead, RPH-CPP (Pharmacist) on 05/29/21 at 1014  Med List Status: <None>   Medication Order Taking? Sig Documenting Provider Last Dose Status Informant  albuterol (VENTOLIN HFA) 108 (90 Base) MCG/ACT inhaler [id number] Yes Inhale 1-2 puffs into the lungs every 6 (six) hours as needed for wheezing or shortness of breath. cough McLean-Scocuzza, Nino Glow, MD Taking Active   amLODipine (NORVASC) 5 MG tablet [id number] Yes Take 1 tablet by mouth once daily McLean-Scocuzza, Nino Glow, MD Taking Active   benztropine (COGENTIN) 1 MG tablet [id number] Yes Take 1 mg by mouth 2 (two) times daily. [provider] Taking Active Self  Biotin w/ Vitamins C & E (HAIR/SKIN/NAILS PO) [id number] Yes Take 2,500 mcg by mouth daily. [provider] Taking Active   Blood Glucose Calibration (TRUE METRIX LEVEL 1) Low SOLN [id number] Yes  [provider] Taking Active Self  cetirizine (ZYRTEC ALLERGY) 10 MG tablet [id number] Yes Take 2 tablets (20 mg total) by mouth daily. McLean-Scocuzza, Nino Glow, MD Taking Active Self  clobetasol cream (TEMOVATE) 0.05 % [id number] No Apply 1 application topically 2 (two) times daily.  Patient not taking: Reported on 05/29/2021   [provider] Not Taking Active Self  clotrimazole (CLOTRIMAZOLE AF) 1 % cream [id number] No Apply 1 application topically 2 (two) times daily.  Patient not taking: Reported on 05/29/2021   McLean-Scocuzza, Nino Glow, MD Not Taking Active   Dextromethorphan-Guaifenesin 60-1200 MG 12hr tablet [id number] No Take 1 tablet by mouth every 12 (twelve) hours. Prn cough green label  Patient not taking: Reported on 05/29/2021   McLean-Scocuzza, Nino Glow,  MD Not Taking Active   diphenhydrAMINE (BENADRYL) 25 MG tablet [id number] Yes Take 25 mg by mouth every 6 (six) hours as needed. [provider] Taking Active Self  EPINEPHrine 0.3 mg/0.3 mL IJ SOAJ injection [id number] No Inject 0.3 mg into the muscle as needed for anaphylaxis.  Patient not taking: Reported on 05/29/2021   McLean-Scocuzza, Nino Glow, MD Not Taking Active   famotidine (PEPCID) 40 MG tablet [id number] Yes Take 1 tablet (40 mg total) by mouth daily. Jonathon Bellows, MD Taking Active            Med Note [name], Arville Lime   Wed May 29, 2021 10:03 AM)    fexofenadine (ALLEGRA) 180 MG tablet [id number] Yes Take 2 tablets by mouth every morning. [provider] Taking Active Self  FIBER ADULT GUMMIES PO [id number] Yes Take by mouth.  [provider] Taking Active   fluconazole (DIFLUCAN) 150 MG tablet [id number] No Take 1 tablet (150 mg total) by mouth once a week. X 2-4 weeks  Patient not taking: Reported on 05/29/2021   McLean-Scocuzza, Nino Glow, MD Not Taking Active   FLUoxetine (PROZAC) 40 MG capsule [id number] Yes Take 40 mg by mouth daily. [provider] Taking Active Self  fluPHENAZine (PROLIXIN) 2.5 MG tablet [id number] Yes Take 2.5 mg by mouth 2 (two) times daily. [provider] Taking Active   fluticasone (FLONASE) 50 MCG/ACT nasal spray [id number] Yes Place 2 sprays into both nostrils daily. McLean-Scocuzza, Nino Glow, MD Taking Active Self  furosemide (LASIX) 20 MG tablet [id number] Yes Take 0.5 tablets (10 mg total) by mouth daily as needed. McLean-Scocuzza, Nino Glow, MD Taking Active   guaiFENesin-codeine [facility]) 100-10 MG/5ML syrup [id number] No Take 5 mLs by mouth at bedtime as needed for cough.  Patient not taking: Reported on 05/29/2021   McLean-Scocuzza, Nino Glow, MD Not Taking Active   hydrocortisone 2.5 % ointment [id number] Yes Apply topically 2 (two) times daily. Left lower abdomen/groin McLean-Scocuzza, Nino Glow, MD Taking Active    hydroxychloroquine (PLAQUENIL) 200 MG tablet [id number] Yes Take 200 mg by mouth 2 (two) times daily. [provider] Taking Active   hydrOXYzine (ATARAX/VISTARIL) 25 MG tablet [id number] Yes Take 25 mg by mouth. 1 in the morning, 1 in the afternoon, and 2 in the evening [provider] Taking Active   Melatonin 10 MG TABS [id number] Yes Take by mouth at bedtime. [provider] Taking Active   meloxicam (MOBIC) 15 MG tablet [id number] Yes Take 15 mg by mouth daily. [provider] Taking Active            Med Note [name] Mar 25, 2021 10:39 AM) Reports taking as needed  metoprolol succinate (TOPROL-XL) 25 MG 24 hr tablet [id number] Yes TAKE 1 TABLET BY MOUTH ONCE DAILY WITH  OR  IMMEDIATELY  FOLLOWING  A  MEAL McLean-Scocuzza, Nino Glow, MD Taking Active   montelukast (SINGULAIR) 10 MG tablet [id number] Yes Take 1 tablet (10 mg total) by mouth daily. McLean-Scocuzza, Nino Glow, MD Taking Active   Multiple Vitamins-Minerals (ALIVE MULTI-VITAMIN PO) [id number] Yes Take by mouth daily in the afternoon. [provider] Taking Active Self  mupirocin ointment (BACTROBAN) 2 % [id number] Yes Apply 1 application topically 2 (two) times daily. Prn right knee wound McLean-Scocuzza, Nino Glow, MD Taking Active   omalizumab Arvid Right) 150 MG/ML prefilled syringe [id number] Yes Inject 300 mg into the skin every 14 (fourteen) days. [provider] Taking Active   omega-3 acid ethyl esters (LOVAZA) 1 g capsule [id number] Yes Take 2 capsules by mouth 2 (two) times daily. [provider] Taking Active   oxyCODONE-acetaminophen (PERCOCET) 10-325 MG tablet [id number] Yes Take 1 tablet by mouth 3 (three) times daily as needed. [provider] Taking Active Self  Probiotic Product (PROBIOTIC DAILY PO) [id number] Yes Take by mouth. probiogen probiotic daily [provider] Taking Active Self  QUEtiapine (SEROQUEL XR) 400 MG 24 hr tablet  [id number] Yes Take 400 mg by mouth daily. [provider] Taking Active   topiramate (TOPAMAX) 200 MG tablet [id number] Yes Take 200 mg by mouth daily. [provider] Taking Active Self  VICTOZA 18 MG/3ML SOPN [id number] Yes Inject 1.8 mg into the skin daily. McLean-Scocuzza, Nino Glow, MD Taking Active            Med Note [name],  Higinio Grow E   Wed May 29, 2021  9:59 AM)    vitamin B-12 (CYANOCOBALAMIN) 1000 MCG tablet [id number] Yes Take 1,000 mcg by mouth daily. [provider] Taking Active   zinc oxide 20 % ointment [id number] Yes Apply 1 application topically 2 (two) times daily as needed for irritation. McLean-Scocuzza, Nino Glow, MD Taking Active             Patient Active Problem List   Diagnosis Date Noted   Abnormal MRI, lumbar spine 02/12/2021   Incontinence of feces 02/12/2021   Intertrigo 02/12/2021   Arthritis of right knee 10/25/2020   Aperistalsis, esophagus 10/25/2020   Insomnia 10/11/2020   Elevated liver enzymes 08/17/2020   Prediabetes 07/24/2020   Arthritis of both knees 06/08/2020   Annual physical exam 04/20/2020   Ingrown right big toenail 04/20/2020   Gastroesophageal reflux disease 04/20/2020   Pain and swelling of right knee 04/20/2020   Mitral valve insufficiency 01/10/2020   Bacterial vaginosis 10/20/2019   Encounter for screening colonoscopy    Hives 02/23/2019   Urticaria 02/23/2019   Vitamin D deficiency 02/01/2019   Mood disorder ([place]) 12/24/2018   HLD (hyperlipidemia) 09/01/2018   History of prediabetes 09/01/2018   Chronic pain 08/31/2018   Allergic rhinitis 08/31/2018   Lumbosacral radiculitis 02/19/2018   S/P laparoscopic cholecystectomy April 2019 01/12/2018   Angioedema 07/07/2017   Bilateral carpal tunnel syndrome 11/03/2016   Frequent PVCs 09/24/2016   Morbid obesity with BMI of 40.0-44.9, adult ([place]) 08/07/2016   Essential hypertension 07/10/2016   OSA on CPAP 07/10/2016    Immunization History   Administered Date(s) Administered   Influenza,inj,Quad PF,6+ Mos 07/12/2016, 06/09/2019   Influenza-Unspecified 05/06/2020   PFIZER(Purple Top)SARS-COV-2 Vaccination 01/17/2020, 02/07/2020   Pneumococcal Polysaccharide-23 07/12/2016   Tdap 04/17/2009    Conditions to be addressed/monitored: Pulmonary Disease and Mood Disorder  Care Plan : Medication Management  Updates made by De Hollingshead, RPH-CPP since 05/29/2021 12:00 AM     Problem: Asthma, Allergies, Mood Disorder, Chronic Pain      Long-Range Goal: Disease Progression Prevention   Start Date: 02/11/2021  This Visit's Progress: On track  Recent Progress: On track  Priority: High  Note:   Current Barriers:  Complex patient with multiple comorbidities at high risk of exacerbation  Pharmacist Clinical Goal(s):  Over the next 90 days, patient will achieve adherence to monitoring guidelines and medication adherence to achieve therapeutic efficacy through collaboration with PharmD and provider.   Interventions: 1:1 collaboration with McLean-Scocuzza, Nino Glow, MD regarding development and update of comprehensive plan of care as evidenced by provider attestation and co-signature Inter-disciplinary care team collaboration (see longitudinal plan of care) Comprehensive medication review performed; medication list updated in electronic medical record  SDOH: No medication cost concerns, Medicare + Medicaid.  Reports she lives with her husband. 2 kids.   ASCVD Risk Reduction: Lipids well controlled at this time. Unable to calculate 10 year ASCVD risk given control of lipids as baseline No indication for statin therapy at this time  History of Pre-Diabetes: Improved per patient report; current regimen: Victoza 1.8 mg daily Reports that she self-restarted Victoza. Endorses reduced appetite, but that she "feels great". Discussed protein shakes and meal supplementation with RN CM at last phone call. Denies GI upset, including  abdominal upset, diarrhea, constipation.  Recommended to continue current regimen at this time. Advised to stop therapy if development of abdominal pain or concerns with gastric motility that were previously discussed w/ GI  Hypertension: Controlled  at last office visit; current treatment: amlodipine 5 mg, metoprolol succinate 25 mg daily; recent prescribed furosemide 10 mg daily PRN edema. She reports she has been taking daily since receiving script from PCP. She plans to refill today Current home readings: purchased a manual cuff. Advised to obtain an automatic cuff for home checks.  Recommended to continue current regimen at this time. Reviewed that furosemide should only be as needed as prescribed by PCP  Asthma/Allergies/uricaria  Improved per patient report; current treatment: Xolair 300 mg, hydroxychloroquine 200 mg BID, fexofenadine 360 mg QAM, cetirizine 20 mg QPM, montelukast 10 mg daily, hydroxyzine 25 mg QAM, 25 mg Qafternoon, 50 mg QPM, fluticasone nasal spray BID, topical clobetasol 0.05% ointment, topical hydrocortisone 2.5% ointment PRN, diphenhydramine PRN; follows w/ [facility] Encouraged to continue current regimen at this time along with collaboration with [facility] specialists. Reviewed previously that collaboration with eye doctor is appropriate given chronic hydroxychloroquine use.   Mood Disorder Moderately well controlled; current treatment: fluoxetine 40 mg daily, fluphenazine 2.5 mg BID benztropine 1 mg BID, topiramate 200 mg daily, hydroxyzine 25 mg as above; follows w/ Ander Slade Reports that she feels well controlled at this time.  Recommend to continue current regimen at this time, along with collaboration with psych.   Pain: Moderately well managed; current regimen: oxycodone/APAP 10/325 mg TID - pain management, meloxicam 15 mg daily PRN - orthopedics Recommended to continue current regimen at this time. Reviewed to use meloxicam PRN as  prescribed  Supplements: Gummies - biotin, multivitamin, probiotic, fiber, melatonin  Patient Goals/Self-Care Activities Over the next 90 days, patient will:  - take medications as prescribed focus on medication adherence by using medication list provided today  Follow Up Plan: Face to Face appointment with care management team member scheduled for:  ~12 weeks      Medication Assistance: None required.  Patient affirms current coverage meets needs.  Patient's preferred pharmacy is:  [facility] [address] ([address]), [place] [address] [place] ([place]) [place] [address] Phone: [phone] Fax: [place], [place] [address] [place] [address] Phone: [phone] Fax: [phone]   Follow Up:  Patient agrees to Care Plan and Follow-up.  Plan: Face to Face appointment with care management team member scheduled for: ~ 12 weeks  Catie [name], PharmD, [place], [place] Clinical Pharmacist [employer] [facility] [employer] [phone]

## 2021-05-29 NOTE — Patient Instructions (Addendum)
It was great talking to you today!  There are refills on the fluid pill (furosemide) at the pharmacy. Dr. Olivia Mackie advised you to use it as needed.   Call me with any questions or concerns!  Catie [name], PharmD [phone]  Visit Information  PATIENT GOALS:  Goals Addressed               This Visit's Progress     Patient Stated     Medication Monitoring (pt-stated)        Patient Goals/Self-Care Activities Over the next 90 days, patient will:  - take medications as prescribed focus on medication adherence by using medication list provided today         Patient verbalizes understanding of instructions provided today and agrees to view in [place].   Plan: Face to Face appointment with care management team member scheduled for: ~ 12 weeks  Catie [name], PharmD, [place], [place]ani [place] Clinical Pharmacist [employer] [facility] [employer] [phone]

## 2021-06-04 DIAGNOSIS — M1711 Unilateral primary osteoarthritis, right knee: Secondary | ICD-10-CM | POA: Diagnosis not present

## 2021-06-05 DIAGNOSIS — M1711 Unilateral primary osteoarthritis, right knee: Secondary | ICD-10-CM | POA: Diagnosis not present

## 2021-06-05 DIAGNOSIS — I10 Essential (primary) hypertension: Secondary | ICD-10-CM | POA: Diagnosis not present

## 2021-06-07 ENCOUNTER — Ambulatory Visit: Payer: Medicare Other

## 2021-06-11 ENCOUNTER — Ambulatory Visit: Payer: Medicare Other | Admitting: Internal Medicine

## 2021-06-24 DIAGNOSIS — M1711 Unilateral primary osteoarthritis, right knee: Secondary | ICD-10-CM | POA: Diagnosis not present

## 2021-06-26 ENCOUNTER — Other Ambulatory Visit: Payer: Self-pay | Admitting: Internal Medicine

## 2021-06-26 DIAGNOSIS — I10 Essential (primary) hypertension: Secondary | ICD-10-CM

## 2021-06-27 ENCOUNTER — Telehealth: Payer: Self-pay | Admitting: Internal Medicine

## 2021-06-27 MED ORDER — GLUCOSE BLOOD VI STRP: ORAL_STRIP | 0 refills | Status: DC

## 2021-06-27 MED ORDER — ACCU-CHEK SOFTCLIX LANCETS MISC: 0 refills | Status: DC

## 2021-06-27 NOTE — Telephone Encounter (Signed)
Medication has been ordered.

## 2021-06-27 NOTE — Telephone Encounter (Signed)
Ok to prescribe lancets and test strips.  Let me know if I need to do anything more.

## 2021-06-27 NOTE — Telephone Encounter (Signed)
Alinda Sierras from [facility] is calling in to check on the status of a refill request they sent over for the patients diabetic testing strips on 9/16/022.She stated that they will not be able to fill the patients prescription for victoza until she has her diabetic testing supplies.Please call Alinda Sierras at [phone].

## 2021-06-27 NOTE — Telephone Encounter (Signed)
Called and spoke with Noral at [facility]. She states that they are unable to refill the patients Victoza until other requirements are met and that includes the patient receiving prescriptions for an Accu-Check Guide Test Strips and Lancets.   Called Railey to confirm. Pt states that she was placed on Victoza by Dr. Tracy and had previously stopped using her Accu-chek glucose meter and had recently restarted using it. Pt planned on discussing this with Dr. Tracy during her appointment in 07/2021. Pt had no other concerns or questions.  Ok to prescribe Lancets and Test Strips? 

## 2021-06-28 ENCOUNTER — Telehealth: Payer: Medicare Other

## 2021-06-28 ENCOUNTER — Telehealth: Payer: Self-pay | Admitting: *Deleted

## 2021-06-28 DIAGNOSIS — Z79899 Other long term (current) drug therapy: Secondary | ICD-10-CM | POA: Diagnosis not present

## 2021-06-28 NOTE — Telephone Encounter (Signed)
  Care Management   Follow Up Note   06/28/2021 Name: Heather Sweeney MRN: [id number] DOB: 1966/03/19   Referred by: McLean-Scocuzza, Nino Glow, MD Reason for referral : Chronic Care Management (HTN, Nutrition)   An unsuccessful telephone outreach was attempted today. The patient was referred to the case management team for assistance with care management and care coordination.   Follow Up Plan: RNCM will seek assistance from Care Guides in rescheduling appointment within the next 30 days.  Hubert Azure RN, MSN RN Care Management Coordinator [place] [phone] [email or web address]

## 2021-07-01 NOTE — Telephone Encounter (Signed)
Patient has been rescheduled.

## 2021-07-03 ENCOUNTER — Telehealth: Payer: Self-pay | Admitting: Internal Medicine

## 2021-07-04 NOTE — Telephone Encounter (Signed)
Patient called stating she was going to see another orthopedic specialist.

## 2021-07-08 ENCOUNTER — Ambulatory Visit (INDEPENDENT_AMBULATORY_CARE_PROVIDER_SITE_OTHER): Payer: Medicare Other | Admitting: *Deleted

## 2021-07-08 DIAGNOSIS — I10 Essential (primary) hypertension: Secondary | ICD-10-CM

## 2021-07-08 DIAGNOSIS — K220 Achalasia of cardia: Secondary | ICD-10-CM

## 2021-07-08 NOTE — Patient Instructions (Signed)
Visit Information  PATIENT GOALS:  Goals Addressed             This Visit's Progress    (RNCM) Eat Healthy   On track    Timeframe:  Long-Range Goal Priority:  Medium Start Date:   02/25/21                          Expected End Date: 12/03/21                  Follow Up Date 07/29/21    Fill half of plate with vegetables Keep a food diary Set a realistic goal  Try to drink/incorporate Glucerna/Boost Shakes or fruit/vegetable smoothies into your daily diet routine Eat at least 3 meals day   Why is this important?   When you are ready to manage your nutrition or weight, having a plan and setting goals will help.  Taking small steps to change how you eat and exercise is a good place to start.    Notes:      (RNCM) Track and Manage My Blood Pressure-Hypertension   On track    Timeframe:  Long-Range Goal Priority:  Medium Start Date:  02/25/21                           Expected End Date: 12/03/21                     Follow Up Date 07/29/21    Check blood pressure 3 times per week Ask husband to help you check your blood pressure Write blood pressure results in [place] Take log to providers office for review Review education sent to MyChart    Why is this important?   You won't feel high blood pressure, but it can still hurt your blood vessels.  High blood pressure can cause heart or kidney problems. It can also cause a stroke.  Making lifestyle changes like losing a little weight or eating less salt will help.  Checking your blood pressure at home and at different times of the day can help to control blood pressure.  If the doctor prescribes medicine remember to take it the way the doctor ordered.  Call the office if you cannot afford the medicine or if there are questions about it.     Notes:         Patient verbalizes understanding of instructions provided today and agrees to view in [place].   The care management team will reach out to the patient  again over the next 30 business days.   Hubert Azure RN, MSN RN Care Management Coordinator [place] [phone] [email or web address]

## 2021-07-08 NOTE — Chronic Care Management (AMB) (Signed)
Chronic Care Management   CCM RN Visit Note  07/08/2021 Name: Heather Sweeney MRN: [id number] DOB: 1966-05-25  Subjective: Heather Sweeney is a 55 y.o. year old female who is a primary care patient of Heather Sweeney, Heather Glow, MD. The care management team was consulted for assistance with disease management and care coordination needs.    Engaged with patient by telephone for follow up visit in response to provider referral for case management and/or care coordination services.   Consent to Services:  The patient was given information about Chronic Care Management services, agreed to services, and gave verbal consent prior to initiation of services.  Please see initial visit note for detailed documentation.   Patient agreed to services and verbal consent obtained.   Assessment: Review of patient past medical history, allergies, medications, health status, including review of consultants reports, laboratory and other test data, was performed as part of comprehensive evaluation and provision of chronic care management services.   SDOH (Social Determinants of Health) assessments and interventions performed:    CCM Care Plan  Allergies  Allergen Reactions   Cephalexin Swelling and Rash   Hctz [Hydrochlorothiazide] Rash and Swelling    Mild she says   Lisinopril Hives and Other (See Comments)    Headache, Low BP, angioedema     Losartan Hives and Swelling    Angioedema   Penicillins Rash    Has patient had a PCN reaction causing immediate rash, facial/tongue/throat swelling, SOB or lightheadedness with hypotension: Yes Has patient had a PCN reaction causing severe rash involving mucus membranes or skin necrosis: No Has patient had a PCN reaction that required hospitalization: No Has patient had a PCN reaction occurring within the last 10 years: No If all of the above answers are "NO", then may proceed with Cephalosporin use.    Bee Venom     Swelling    Metformin And Related      Rash     Outpatient Encounter Medications as of 07/08/2021  Medication Sig Note   QUEtiapine (SEROQUEL XR) 400 MG 24 hr tablet Take 400 mg by mouth daily.    VICTOZA 18 MG/3ML SOPN Inject 1.8 mg into the skin daily.    Accu-Chek Softclix Lancets lancets Use as instructed    albuterol (VENTOLIN HFA) 108 (90 Base) MCG/ACT inhaler Inhale 1-2 puffs into the lungs every 6 (six) hours as needed for wheezing or shortness of breath. cough    amLODipine (NORVASC) 5 MG tablet Take 1 tablet by mouth once daily    benztropine (COGENTIN) 1 MG tablet Take 1 mg by mouth 2 (two) times daily.    Biotin w/ Vitamins C & E (HAIR/SKIN/NAILS PO) Take 2,500 mcg by mouth daily.    Blood Glucose Calibration (TRUE METRIX LEVEL 1) Low SOLN     cetirizine (ZYRTEC ALLERGY) 10 MG tablet Take 2 tablets (20 mg total) by mouth daily.    clobetasol cream (TEMOVATE) 9.16 % Apply 1 application topically 2 (two) times daily. (Patient not taking: Reported on 05/29/2021)    clotrimazole (CLOTRIMAZOLE AF) 1 % cream Apply 1 application topically 2 (two) times daily. (Patient not taking: Reported on 05/29/2021)    Dextromethorphan-Guaifenesin 60-1200 MG 12hr tablet Take 1 tablet by mouth every 12 (twelve) hours. Prn cough green label (Patient not taking: Reported on 05/29/2021)    diphenhydrAMINE (BENADRYL) 25 MG tablet Take 25 mg by mouth every 6 (six) hours as needed.    EPINEPHrine 0.3 mg/0.3 mL IJ SOAJ injection Inject 0.3 mg  into the muscle as needed for anaphylaxis. (Patient not taking: Reported on 05/29/2021)    famotidine (PEPCID) 40 MG tablet Take 1 tablet (40 mg total) by mouth daily.    fexofenadine (ALLEGRA) 180 MG tablet Take 2 tablets by mouth every morning.    FIBER ADULT GUMMIES PO Take by mouth.    fluconazole (DIFLUCAN) 150 MG tablet Take 1 tablet (150 mg total) by mouth once a week. X 2-4 weeks (Patient not taking: Reported on 05/29/2021)    FLUoxetine (PROZAC) 40 MG capsule Take 40 mg by mouth daily.    fluPHENAZine  (PROLIXIN) 2.5 MG tablet Take 2.5 mg by mouth 2 (two) times daily.    fluticasone (FLONASE) 50 MCG/ACT nasal spray Place 2 sprays into both nostrils daily.    furosemide (LASIX) 20 MG tablet Take 0.5 tablets (10 mg total) by mouth daily as needed.    glucose blood test strip Use as instructed    guaiFENesin-codeine (ROBITUSSIN AC) 100-10 MG/5ML syrup Take 5 mLs by mouth at bedtime as needed for cough. (Patient not taking: Reported on 05/29/2021)    hydrocortisone 2.5 % ointment Apply topically 2 (two) times daily. Left lower abdomen/groin    hydroxychloroquine (PLAQUENIL) 200 MG tablet Take 200 mg by mouth 2 (two) times daily.    hydrOXYzine (ATARAX/VISTARIL) 25 MG tablet Take 25 mg by mouth. 1 in the morning, 1 in the afternoon, and 2 in the evening    Melatonin 10 MG TABS Take by mouth at bedtime.    meloxicam (MOBIC) 15 MG tablet Take 15 mg by mouth daily. 03/25/2021: Reports taking as needed   metoprolol succinate (TOPROL-XL) 25 MG 24 hr tablet TAKE 1 TABLET BY MOUTH ONCE DAILY WITH  OR  IMMEDIATELY  FOLLOWING  A  MEAL    montelukast (SINGULAIR) 10 MG tablet Take 1 tablet (10 mg total) by mouth daily.    Multiple Vitamins-Minerals (ALIVE MULTI-VITAMIN PO) Take by mouth daily in the afternoon.    mupirocin ointment (BACTROBAN) 2 % Apply 1 application topically 2 (two) times daily. Prn right knee wound    omalizumab Arvid Right) 150 MG/ML prefilled syringe Inject 300 mg into the skin every 28 (twenty-eight) days.    omega-3 acid ethyl esters (LOVAZA) 1 g capsule Take 2 capsules by mouth 2 (two) times daily.    oxyCODONE-acetaminophen (PERCOCET) 10-325 MG tablet Take 1 tablet by mouth 3 (three) times daily as needed.    Probiotic Product (PROBIOTIC DAILY PO) Take by mouth. probiogen probiotic daily    topiramate (TOPAMAX) 200 MG tablet Take 200 mg by mouth daily.    vitamin B-12 (CYANOCOBALAMIN) 1000 MCG tablet Take 1,000 mcg by mouth daily.    zinc oxide 20 % ointment Apply 1 application topically 2  (two) times daily as needed for irritation.    No facility-administered encounter medications on file as of 07/08/2021.    Patient Active Problem List   Diagnosis Date Noted   Abnormal MRI, lumbar spine 02/12/2021   Incontinence of feces 02/12/2021   Intertrigo 02/12/2021   Arthritis of right knee 10/25/2020   Aperistalsis, esophagus 10/25/2020   Insomnia 10/11/2020   Elevated liver enzymes 08/17/2020   Prediabetes 07/24/2020   Arthritis of both knees 06/08/2020   Annual physical exam 04/20/2020   Ingrown right big toenail 04/20/2020   Gastroesophageal reflux disease 04/20/2020   Pain and swelling of right knee 04/20/2020   Mitral valve insufficiency 01/10/2020   Bacterial vaginosis 10/20/2019   Encounter for screening colonoscopy  Hives 02/23/2019   Urticaria 02/23/2019   Vitamin D deficiency 02/01/2019   Mood disorder ([place]) 12/24/2018   HLD (hyperlipidemia) 09/01/2018   History of prediabetes 09/01/2018   Chronic pain 08/31/2018   Allergic rhinitis 08/31/2018   Lumbosacral radiculitis 02/19/2018   S/P laparoscopic cholecystectomy April 2019 01/12/2018   Angioedema 07/07/2017   Bilateral carpal tunnel syndrome 11/03/2016   Frequent PVCs 09/24/2016   Morbid obesity with BMI of 40.0-44.9, adult ([place]) 08/07/2016   Essential hypertension 07/10/2016   OSA on CPAP 07/10/2016    Conditions to be addressed/monitored:HTN and Nutrition  Care Plan : Wellness (Adult)  Updates made by Leona Singleton, RN since 07/08/2021 12:00 AM     Problem: Healthy Nutrition (Wellness)   Priority: Medium     Long-Range Goal: Patient will report eating atleast 2-3 meals a day in the next 180 days   Start Date: 02/25/2021  Expected End Date: 12/03/2021  This Visit's Progress: On track  Recent Progress: Not on track  Priority: Medium  Note:   Current Barriers:  Ineffective Self Health Maintenance; patient reporting only eating about one meal a day due to decrease in appetite.  Reporting  increase in appetite and meal intake.  Has history of Aperistalsis Esophagus with difficulties swallowing pills and some foods. States her foods have to be pureed and they are just not pleasing to her.  Admits to being scared of choking at times. Clinical Goal(s):  Collaboration with Heather Sweeney, Heather Glow, MD regarding development and update of comprehensive plan of care as evidenced by provider attestation and co-signature Inter-disciplinary care team collaboration (see longitudinal plan of care) patient will work with care management team to address care coordination and chronic disease management needs related to Disease Management and Educational Needs   Interventions:  Evaluation of current treatment plan related to  decreased nutrition/Aperistalsis Esophagus ,  due to decreased appetite and knowledge deficit related to adequate nutrition  self-management and patient's adherence to plan as established by provider. Collaboration with Heather Sweeney, Heather Glow, MD regarding development and update of comprehensive plan of care as evidenced by provider attestation       and co-signature Inter-disciplinary care team collaboration (see longitudinal plan of care) Discussed plans with patient for ongoing care management follow up and provided patient with direct contact information for care management team Reviewed current dietary intake and exercise levels and encouraged small steps toward making change to eating and exercising increasing activity as tolerated Encouraged patient to discuss decreased appetite and meal intake with providers Discussed with patient need to eat at least 3 meals a day or 5-6 small meals a day for adequate nutrition and appropriate metabolism Incremental change encouraged Making healthy food choices encouraged Confirmed with patient, history of aperistalsis esophagus not the reason for decreased nutrition; per patient it is not, just decreased in appetite Sent EMMI video  on nutrition and diabetes Discussed importance of nutrition related to health Reviewed and encouraged healthier meal and drink options Reviewed medications and indications and encouraged medication compliance Discussed importance of balanced nutrition and encouraged patient to consider drinking Glucerna/Boost Shakes or fruit/vegetable smoothies to help with nutrition while balancing swallowing issues Patient Goals/Self Care Activities: Fill half of plate with vegetables Keep a food diary Set a realistic goal  Try to drink/incorporate Glucerna/Boost Shakes or fruit/vegetable smoothies into your daily diet routine Eat at least 3 meals day Follow Up Plan: The care management team will reach out to the patient again over the next 45 business days.  Care Plan : Hypertension (Adult)  Updates made by Leona Singleton, RN since 07/08/2021 12:00 AM     Problem: Hypertension (Hypertension)   Priority: Medium     Long-Range Goal: Hypertension Monitored   Start Date: 02/25/2021  Expected End Date: 10/04/2021  This Visit's Progress: On track  Recent Progress: On track  Priority: Medium  Note:   Objective:  Last practice recorded BP readings:  BP Readings from Last 3 Encounters:  04/17/21 130/76  02/15/21 117/77  02/14/21 122/78  Current Barriers:  Knowledge Deficits related to basic understanding of hypertension pathophysiology and self care management as evidenced by patient reporting blood pressures at home runs 150/70's.  Patient reporting blood pressure 128-130/50-72's. 0  Denies any lightheadedness or dizziness.  Reports compliance with all her medications and reports her blood pressures are usually checked in the evenings.  Complaining of bilateral knee pain being treated by orthopedics. Case Manager Clinical Goal(s):  patient will verbalize understanding of plan for hypertension management patient will attend all scheduled medical appointments: 8/10 with PCP patient will  demonstrate improved adherence to prescribed treatment plan for hypertension as evidenced by taking all medications as prescribed, monitoring and recording blood pressure as directed, adhering to low sodium/DASH diet Interventions:  Collaboration with Heather Sweeney, Heather Glow, MD regarding development and update of comprehensive plan of care as evidenced by provider attestation and co-signature Inter-disciplinary care team collaboration (see longitudinal plan of care) Encouraged continued use of home blood pressure monitoring and recording in blood pressure log; include symptoms of hypotension or potential medication side effects in log Shared overall cardiovascular risk with patient; encourage changes to lifestyle risk factors, including alcohol consumption, smoking, inadequate exercise, poor dietary habits and stress Evaluation of current treatment plan related to hypertension self management and patient's adherence to plan as established by provider. Provided education to patient re: stroke prevention, s/s of heart attack and stroke, DASH diet, complications of uncontrolled blood pressure Reviewed medications with patient and discussed importance of compliance Discussed plans with patient for ongoing care management follow up and provided patient with direct contact information for care management team Advised patient, providing education and rationale, to monitor blood pressure 3 times a week and record, calling PCP for findings outside established parameters.  Home blood pressure monitoring encouraged; discussed with patient seeking assistance from husband in checking her blood pressures at home Encouraged to attend all scheduled provider appointments  Encouraged to call provider office for new concerns, questions, or BP outside discussed parameters   Discussed and encouraged to follow a low sodium diet/DASH diet Sent education on hypertension and encouraged patient to review information Sent  2022 [place] to help with logging blood pressures; encouraged patient to use booklet to document blood pressures Encouraged patient to contact provider for any increase of blood pressures >160's Discussed importance of hypertension management/blood pressure control and dangers of hypertension Discussed and instructed patient to take home cuff to next in person appointment to have staff review for accuracy Instructed to review current blood pressure log with provider and pharmacist Patient Goals/Self-Care Activities: Check blood pressure 3 times per week Ask husband to help you check your blood pressure Write blood pressure results in [place] Take log to providers office for review Review education sent to MyChart  Follow Up Plan: The care management team will reach out to the patient again over the next 45 business days.         Plan:The care management team will reach out to the  patient again over the next 45 business days.  Hubert Azure RN, MSN RN Care Management Coordinator [place] [phone] [email or web address]

## 2021-07-18 DIAGNOSIS — M25461 Effusion, right knee: Secondary | ICD-10-CM | POA: Diagnosis not present

## 2021-07-18 DIAGNOSIS — M25561 Pain in right knee: Secondary | ICD-10-CM | POA: Diagnosis not present

## 2021-07-18 DIAGNOSIS — G8929 Other chronic pain: Secondary | ICD-10-CM | POA: Diagnosis not present

## 2021-07-18 DIAGNOSIS — M1711 Unilateral primary osteoarthritis, right knee: Secondary | ICD-10-CM | POA: Diagnosis not present

## 2021-07-22 ENCOUNTER — Other Ambulatory Visit: Payer: Self-pay | Admitting: Internal Medicine

## 2021-07-23 ENCOUNTER — Other Ambulatory Visit: Payer: Self-pay | Admitting: Internal Medicine

## 2021-07-23 DIAGNOSIS — G894 Chronic pain syndrome: Secondary | ICD-10-CM | POA: Diagnosis not present

## 2021-07-23 DIAGNOSIS — M25569 Pain in unspecified knee: Secondary | ICD-10-CM | POA: Diagnosis not present

## 2021-07-23 DIAGNOSIS — J309 Allergic rhinitis, unspecified: Secondary | ICD-10-CM

## 2021-07-23 DIAGNOSIS — M797 Fibromyalgia: Secondary | ICD-10-CM | POA: Diagnosis not present

## 2021-07-23 DIAGNOSIS — Z79891 Long term (current) use of opiate analgesic: Secondary | ICD-10-CM | POA: Diagnosis not present

## 2021-07-23 DIAGNOSIS — M47816 Spondylosis without myelopathy or radiculopathy, lumbar region: Secondary | ICD-10-CM | POA: Diagnosis not present

## 2021-07-23 DIAGNOSIS — M5451 Vertebrogenic low back pain: Secondary | ICD-10-CM | POA: Diagnosis not present

## 2021-07-23 DIAGNOSIS — Z5181 Encounter for therapeutic drug level monitoring: Secondary | ICD-10-CM | POA: Diagnosis not present

## 2021-07-29 ENCOUNTER — Ambulatory Visit: Payer: Medicare Other | Admitting: *Deleted

## 2021-07-29 DIAGNOSIS — K220 Achalasia of cardia: Secondary | ICD-10-CM

## 2021-07-29 DIAGNOSIS — I10 Essential (primary) hypertension: Secondary | ICD-10-CM

## 2021-07-29 NOTE — Chronic Care Management (AMB) (Signed)
  Care Management   Follow Up Note   07/29/2021 Name: Heather Sweeney MRN: [id number] DOB: March 21, 1966   Referred by: McLean-Scocuzza, Nino Glow, MD Reason for referral : Chronic Care Management (HTN, NUTRITION)   Successful telephone  outreach to patient for follow up.  Patient answered and stated she was in meeting.  Requesting call back.   Follow Up Plan: The care management team will reach out to the patient again over the next 30 business days.    Hubert Azure RN, MSN RN Care Management Coordinator [place] [phone] [email or web address]

## 2021-08-01 ENCOUNTER — Other Ambulatory Visit: Payer: Self-pay

## 2021-08-01 ENCOUNTER — Encounter: Payer: Self-pay | Admitting: Internal Medicine

## 2021-08-01 ENCOUNTER — Ambulatory Visit (INDEPENDENT_AMBULATORY_CARE_PROVIDER_SITE_OTHER): Payer: Medicare Other | Admitting: Internal Medicine

## 2021-08-01 VITALS — BP 128/80 | HR 101 | Temp 98.4°F | Ht 63.0 in | Wt 228.4 lb

## 2021-08-01 DIAGNOSIS — J309 Allergic rhinitis, unspecified: Secondary | ICD-10-CM | POA: Diagnosis not present

## 2021-08-01 DIAGNOSIS — E2839 Other primary ovarian failure: Secondary | ICD-10-CM

## 2021-08-01 DIAGNOSIS — Z23 Encounter for immunization: Secondary | ICD-10-CM

## 2021-08-01 DIAGNOSIS — R600 Localized edema: Secondary | ICD-10-CM | POA: Diagnosis not present

## 2021-08-01 DIAGNOSIS — Z1329 Encounter for screening for other suspected endocrine disorder: Secondary | ICD-10-CM | POA: Diagnosis not present

## 2021-08-01 DIAGNOSIS — Z87898 Personal history of other specified conditions: Secondary | ICD-10-CM

## 2021-08-01 DIAGNOSIS — Z6841 Body Mass Index (BMI) 40.0 and over, adult: Secondary | ICD-10-CM

## 2021-08-01 DIAGNOSIS — H9193 Unspecified hearing loss, bilateral: Secondary | ICD-10-CM | POA: Diagnosis not present

## 2021-08-01 DIAGNOSIS — I10 Essential (primary) hypertension: Secondary | ICD-10-CM | POA: Diagnosis not present

## 2021-08-01 LAB — COMPREHENSIVE METABOLIC PANEL
ALT: 24 U/L (ref 0–35)
AST: 17 U/L (ref 0–37)
Albumin: 4.1 g/dL (ref 3.5–5.2)
Alkaline Phosphatase: 73 U/L (ref 39–117)
BUN: 6 mg/dL (ref 6–23)
CO2: 24 mEq/L (ref 19–32)
Calcium: 8.9 mg/dL (ref 8.4–10.5)
Chloride: 107 mEq/L (ref 96–112)
Creatinine, Ser: 1.29 mg/dL — ABNORMAL HIGH (ref 0.40–1.20)
GFR: 47.51 mL/min — ABNORMAL LOW (ref >60.00)
Glucose, Bld: 92 mg/dL (ref 70–99)
Potassium: 3.5 mEq/L (ref 3.5–5.1)
Sodium: 139 mEq/L (ref 135–145)
Total Bilirubin: 0.3 mg/dL (ref 0.2–1.2)
Total Protein: 6.9 g/dL (ref 6.0–8.3)

## 2021-08-01 LAB — CBC WITH DIFFERENTIAL/PLATELET
Basophils Absolute: 0 10*3/uL (ref 0.0–0.1)
Basophils Relative: 0.8 % (ref 0.0–3.0)
Eosinophils Absolute: 0.2 10*3/uL (ref 0.0–0.7)
Eosinophils Relative: 3.3 % (ref 0.0–5.0)
HCT: 34.5 % — ABNORMAL LOW (ref 36.0–46.0)
Hemoglobin: 11.5 g/dL — ABNORMAL LOW (ref 12.0–15.0)
Lymphocytes Relative: 34.8 % (ref 12.0–46.0)
Lymphs Abs: 1.6 10*3/uL (ref 0.7–4.0)
MCHC: 33.3 g/dL (ref 30.0–36.0)
MCV: 81.2 fl (ref 78.0–100.0)
Monocytes Absolute: 0.4 10*3/uL (ref 0.1–1.0)
Monocytes Relative: 8.5 % (ref 3.0–12.0)
Neutro Abs: 2.4 10*3/uL (ref 1.4–7.7)
Neutrophils Relative %: 52.6 % (ref 43.0–77.0)
Platelets: 187 10*3/uL (ref 150.0–400.0)
RBC: 4.25 Mil/uL (ref 3.87–5.11)
RDW: 14.7 % (ref 11.5–15.5)
WBC: 4.6 10*3/uL (ref 4.0–10.5)

## 2021-08-01 LAB — HEMOGLOBIN A1C: Hgb A1c MFr Bld: 5.2 % (ref 4.6–6.5)

## 2021-08-01 LAB — TSH: TSH: 1.01 u[IU]/mL (ref 0.35–5.50)

## 2021-08-01 LAB — LIPID PANEL
Cholesterol: 158 mg/dL (ref 0–200)
HDL: 50.9 mg/dL (ref >39.00)
LDL Cholesterol: 86 mg/dL (ref 0–99)
NonHDL: 106.75
Total CHOL/HDL Ratio: 3
Triglycerides: 103 mg/dL (ref 0.0–149.0)
VLDL: 20.6 mg/dL (ref 0.0–40.0)

## 2021-08-01 MED ORDER — MONTELUKAST SODIUM 10 MG PO TABS: 10.0000 mg | ORAL_TABLET | Freq: Every day | ORAL | 3 refills | Status: DC

## 2021-08-01 MED ORDER — FLUTICASONE PROPIONATE 50 MCG/ACT NA SUSP: 2.0000 | Freq: Every day | NASAL | 3 refills | Status: DC

## 2021-08-01 MED ORDER — METOPROLOL SUCCINATE ER 25 MG PO TB24: ORAL_TABLET | ORAL | 3 refills | Status: DC

## 2021-08-01 MED ORDER — SEMAGLUTIDE (1 MG/DOSE) 4 MG/3ML ~~LOC~~ SOPN: 1.0000 mg | PEN_INJECTOR | SUBCUTANEOUS | 2 refills | Status: DC

## 2021-08-01 MED ORDER — AMLODIPINE BESYLATE 5 MG PO TABS: 5.0000 mg | ORAL_TABLET | Freq: Every day | ORAL | 3 refills | Status: DC

## 2021-08-01 MED ORDER — FUROSEMIDE 20 MG PO TABS: 10.0000 mg | ORAL_TABLET | Freq: Every day | ORAL | 1 refills | Status: DC | PRN

## 2021-08-01 MED ORDER — HYDROXYZINE HCL 25 MG PO TABS: ORAL_TABLET | ORAL | 11 refills | Status: DC

## 2021-08-01 NOTE — Progress Notes (Signed)
Chief Complaint  Patient presents with   Follow-up   F/u  1.right knee pain with 3 mechanical falls f/u ortho/sports medicine has knee brace but wants to try ace wrap as knee brace falls off also steroid injections have not helped in the past  2. Htn controlled on norvasc 5 mg qd lasix 20 mg qd, toprol 25 mg qd  3. Obesity currently on 1.8 victoza daily will switch to ozempic 1 mg weekly no samples today will sent to pharmacy wt down from 244 to 228 4. Rx tdap given flu shot today  5. [facility]s [facility] another pt with her same name and dob they get them confused    Review of Systems  Constitutional:  Negative for weight loss.  HENT:  Positive for hearing loss.   Eyes:  Negative for blurred vision.  Respiratory:  Negative for shortness of breath.   Cardiovascular:  Negative for chest pain.  Gastrointestinal:  Negative for abdominal pain.  Musculoskeletal:  Positive for falls and joint pain.  Skin:  Negative for rash.  Neurological:  Negative for headaches.  Psychiatric/Behavioral:  Negative for depression.   Past Medical History:  Diagnosis Date   Allergic rhinitis    Asthma    Bipolar disorder ([place])    Chronic low back pain    CTS (carpal tunnel syndrome)    hands   Depression    Gallstones    GERD (gastroesophageal reflux disease)    History of prediabetes    Hypertension    Laceration of right foot    Onychomycosis    OSA on CPAP    Overactive bladder    PVC's (premature ventricular contractions)    PVC's (premature ventricular contractions)    Dr. Raliegh Ip cards   Seizures [facility])    8th grade   Past Surgical History:  Procedure Laterality Date   ABDOMINAL HYSTERECTOMY  07/08/2011   ABDOMINAL HYSTERECTOMY     supracervical in 07/2011    [place]     right hand [place]     2012   CHOLECYSTECTOMY N/A 01/12/2018   Procedure: LAPAROSCOPIC CHOLECYSTECTOMY WITH INTRAOPERATIVE CHOLANGIOGRAM ERAS PATHWAY;   Surgeon: Johnathan Hausen, MD;  Location: [facility] ORS;  Service: General;  Laterality: N/A;   COLONOSCOPY WITH PROPOFOL N/A 04/15/2019   Procedure: COLONOSCOPY WITH PROPOFOL;  Surgeon: Jonathon Bellows, MD;  Location: [facility] ENDOSCOPY;  Service: Gastroenterology;  Laterality: N/A;   DILATION AND CURETTAGE OF UTERUS     04/2011 benign endometrial polyp squamous metaplasia, inflamm, blood, mucous, benign    ESOPHAGOGASTRODUODENOSCOPY (EGD) WITH PROPOFOL N/A 10/23/2020   Procedure: ESOPHAGOGASTRODUODENOSCOPY (EGD) WITH PROPOFOL;  Surgeon: Jonathon Bellows, MD;  Location: [facility] ENDOSCOPY;  Service: Gastroenterology;  Laterality: N/A;   FLEXIBLE SIGMOIDOSCOPY N/A 02/15/2021   Procedure: FLEXIBLE SIGMOIDOSCOPY;  Surgeon: Jonathon Bellows, MD;  Location: [facility] ENDOSCOPY;  Service: Gastroenterology;  Laterality: N/A;   FOOT SURGERY     Dr. Prudence Davidson    LEEP     2006   TUBAL LIGATION     Family History  Problem Relation Age of Onset   Cancer Father        esophageal    Hypertension Father    Heart failure Mother    Hypertension Mother    Early death Mother    Heart disease Mother        chf   Breast cancer Sister 59   Cancer Sister        breast  Depression Sister    Mental illness Sister    Asthma Son    Cancer Other        brain cancer   Social History   Socioeconomic History   Marital status: Married    Spouse name: Not on file   Number of children: Not on file   Years of education: Not on file   Highest education level: Not on file  Occupational History   Not on file  Tobacco Use   Smoking status: Never   Smokeless tobacco: Current    Types: Snuff   Tobacco comments:    Is in process of decreasing amount  Vaping Use   Vaping Use: Never used  Substance and Sexual Activity   Alcohol use: No    Alcohol/week: 0.0 standard drinks   Drug use: No   Sexual activity: Not Currently    Birth control/protection: Surgical    Comment: Hysterectomy  Other Topics Concern   Not on file  Social History  Narrative   Married    No guns    Wears seat belt    Safe in relationship    Never smoker    2 sons Gaspar Bidding ~10 and another son ~28 as of 10/2019   1 granson eli 1 y.o as of 10/2019   2 sisters    Unemployed       Social Determinants of Health   Financial Resource Strain: Low Risk    Difficulty of Paying Living Expenses: Not hard at all  Food Insecurity: No Food Insecurity   Worried About Charity fundraiser in the Last Year: Never true   Arboriculturist in the Last Year: Never true  Transportation Needs: No Transportation Needs   Lack of Transportation (Medical): No   Lack of Transportation (Non-Medical): No  Physical Activity: Not on file  Stress: No Stress Concern Present   Feeling of Stress : Not at all  Social Connections: Unknown   Frequency of Communication with Friends and Family: Not on file   Frequency of Social Gatherings with Friends and Family: Not on file   Attends Religious Services: Not on file   Active Member of Clubs or Organizations: Not on file   Attends Archivist Meetings: Not on file   Marital Status: Married  Human resources officer Violence: Not At Risk   Fear of Current or Ex-Partner: No   Emotionally Abused: No   Physically Abused: No   Sexually Abused: No   Current Meds  Medication Sig   Accu-Chek Softclix Lancets lancets Use as instructed   benztropine (COGENTIN) 1 MG tablet Take 1 mg by mouth 2 (two) times daily.   Biotin w/ Vitamins C & E (HAIR/SKIN/NAILS PO) Take 2,500 mcg by mouth daily.   Blood Glucose Calibration (TRUE METRIX LEVEL 1) Low SOLN    Blood Glucose Monitoring Suppl (ACCU-CHEK GUIDE ME) w/Device KIT Use as directed,once daily to test blood sugar   cetirizine (ZYRTEC ALLERGY) 10 MG tablet Take 2 tablets (20 mg total) by mouth daily.   clobetasol cream (TEMOVATE) 5.00 % Apply 1 application topically 2 (two) times daily.   clotrimazole (CLOTRIMAZOLE AF) 1 % cream Apply 1 application topically 2 (two) times daily.    Dextromethorphan-Guaifenesin 60-1200 MG 12hr tablet Take 1 tablet by mouth every 12 (twelve) hours. Prn cough green label   diphenhydrAMINE (BENADRYL) 25 MG tablet Take 25 mg by mouth every 6 (six) hours as needed.   EASY COMFORT PEN NEEDLES 31G X 8  MM MISC INJECT VICTOZA ONCE DAILY AS DIRECTED   EPINEPHrine 0.3 mg/0.3 mL IJ SOAJ injection Inject 0.3 mg into the muscle as needed for anaphylaxis.   famotidine (PEPCID) 40 MG tablet Take 1 tablet (40 mg total) by mouth daily.   fexofenadine (ALLEGRA) 180 MG tablet Take 2 tablets by mouth every morning.   FIBER ADULT GUMMIES PO Take by mouth.   FLUoxetine (PROZAC) 40 MG capsule Take 40 mg by mouth daily.   fluPHENAZine (PROLIXIN) 2.5 MG tablet Take 2.5 mg by mouth 2 (two) times daily.   glucose blood test strip Use as instructed   hydrocortisone 2.5 % ointment Apply topically 2 (two) times daily. Left lower abdomen/groin   hydroxychloroquine (PLAQUENIL) 200 MG tablet Take 200 mg by mouth 2 (two) times daily.   Lancet Devices (EASY MINI EJECT LANCING DEVICE) MISC Use as directed,once daily to test blood sugar   Melatonin 10 MG TABS Take by mouth at bedtime.   meloxicam (MOBIC) 15 MG tablet Take 15 mg by mouth daily.   Multiple Vitamins-Minerals (ALIVE MULTI-VITAMIN PO) Take by mouth daily in the afternoon.   mupirocin ointment (BACTROBAN) 2 % Apply 1 application topically 2 (two) times daily. Prn right knee wound   omalizumab Arvid Right) 150 MG/ML prefilled syringe Inject 300 mg into the skin every 28 (twenty-eight) days.   oxyCODONE-acetaminophen (PERCOCET) 10-325 MG tablet Take 1 tablet by mouth 3 (three) times daily as needed.   Probiotic Product (PROBIOTIC DAILY PO) Take by mouth. probiogen probiotic daily   QUEtiapine (SEROQUEL XR) 400 MG 24 hr tablet Take 400 mg by mouth daily.   topiramate (TOPAMAX) 200 MG tablet Take 200 mg by mouth daily.   vitamin B-12 (CYANOCOBALAMIN) 1000 MCG tablet Take 1,000 mcg by mouth daily.   zinc oxide 20 % ointment  Apply 1 application topically 2 (two) times daily as needed for irritation.   [DISCONTINUED] albuterol (VENTOLIN HFA) 108 (90 Base) MCG/ACT inhaler Inhale 1-2 puffs into the lungs every 6 (six) hours as needed for wheezing or shortness of breath. cough   [DISCONTINUED] amLODipine (NORVASC) 5 MG tablet Take 1 tablet by mouth once daily   [DISCONTINUED] fluconazole (DIFLUCAN) 150 MG tablet Take 1 tablet (150 mg total) by mouth once a week. X 2-4 weeks   [DISCONTINUED] fluticasone (FLONASE) 50 MCG/ACT nasal spray Place 2 sprays into both nostrils daily.   [DISCONTINUED] furosemide (LASIX) 20 MG tablet Take 0.5 tablets (10 mg total) by mouth daily as needed.   [DISCONTINUED] guaiFENesin-codeine (ROBITUSSIN AC) 100-10 MG/5ML syrup Take 5 mLs by mouth at bedtime as needed for cough.   [DISCONTINUED] hydrOXYzine (ATARAX/VISTARIL) 25 MG tablet Take 25 mg by mouth. 1 in the morning, 1 in the afternoon, and 2 in the evening   [DISCONTINUED] metoprolol succinate (TOPROL-XL) 25 MG 24 hr tablet TAKE 1 TABLET BY MOUTH ONCE DAILY WITH  OR  IMMEDIATELY  FOLLOWING  A  MEAL   [DISCONTINUED] montelukast (SINGULAIR) 10 MG tablet Take 1 tablet (10 mg total) by mouth daily.   [DISCONTINUED] omega-3 acid ethyl esters (LOVAZA) 1 g capsule Take 2 capsules by mouth in the morning and 2 capsules by mouth in the evening   [DISCONTINUED] VICTOZA 18 MG/3ML SOPN Inject 1.8 mg into the skin daily.   Allergies  Allergen Reactions   Cephalexin Swelling and Rash   Hctz [Hydrochlorothiazide] Rash and Swelling    Mild she says   Lisinopril Hives and Other (See Comments)    Headache, Low BP, angioedema  Losartan Hives and Swelling    Angioedema   Penicillins Rash    Has patient had a PCN reaction causing immediate rash, facial/tongue/throat swelling, SOB or lightheadedness with hypotension: Yes Has patient had a PCN reaction causing severe rash involving mucus membranes or skin necrosis: No Has patient had a PCN reaction  that required hospitalization: No Has patient had a PCN reaction occurring within the last 10 years: No If all of the above answers are "NO", then may proceed with Cephalosporin use.    Bee Venom     Swelling    Metformin And Related     Rash    No results found for this or any previous visit (from the past 2160 hour(s)). Objective  Body mass index is 40.46 kg/m. Wt Readings from Last 3 Encounters:  08/01/21 228 lb 6.4 oz (103.6 kg)  04/17/21 239 lb 12.8 oz (108.8 kg)  02/15/21 244 lb (110.7 kg)   Temp Readings from Last 3 Encounters:  08/01/21 98.4 F (36.9 C) (Oral)  04/17/21 98.3 F (36.8 C) (Oral)  02/15/21 (!) 96.5 F (35.8 C) (Temporal)   BP Readings from Last 3 Encounters:  08/01/21 128/80  04/17/21 130/76  02/15/21 117/77   Pulse Readings from Last 3 Encounters:  08/01/21 (!) 101  04/17/21 78  02/15/21 81    Physical Exam Vitals and nursing note reviewed.  Constitutional:      Appearance: Normal appearance. She is well-developed and well-groomed. She is morbidly obese.  HENT:     Head: Normocephalic and atraumatic.  Eyes:     Conjunctiva/sclera: Conjunctivae normal.     Pupils: Pupils are equal, round, and reactive to light.  Cardiovascular:     Rate and Rhythm: Normal rate and regular rhythm.     Heart sounds: Normal heart sounds. No murmur heard. Skin:    General: Skin is warm and dry.  Neurological:     General: No focal deficit present.     Mental Status: She is alert and oriented to person, place, and time. Mental status is at baseline.     Gait: Gait normal.  Psychiatric:        Attention and Perception: Attention and perception normal.        Mood and Affect: Mood and affect normal.        Speech: Speech normal.        Behavior: Behavior normal. Behavior is cooperative.        Thought Content: Thought content normal.        Cognition and Memory: Cognition and memory normal.        Judgment: Judgment normal.    Assessment  Plan   Essential hypertension controlled- Plan: Comprehensive metabolic panel, Lipid panel, CBC w/Diff, metoprolol succinate (TOPROL-XL) 25 MG 24 hr tablet, amLODipine (NORVASC) 5 MG tablet  Chronic right knee pain s/p steroid injections  F/u ortho and or [facility]  Bilateral hearing loss,has hearing aids per Dr. Kathyrn Sheriff  History of prediabetes - Plan: Hemoglobin A1c  Leg edema - Plan: furosemide (LASIX) 20 MG tablet qd prn helping  Allergic rhinitis, unspecified seasonality, unspecified trigger - Plan: fluticasone (FLONASE) 50 MCG/ACT nasal spray  Morbid obesity with BMI of 40.0-44.9, adult (HCC)  Trial of ozempic was on victoza 1.8 qd   HM Flu shot today Tdap utd due 04/18/2019 will do in future given Rx to get at [place] 2/2 consider booster  Allergic to pna vaccine per pt due to allergist has on meds she does not  have reactions to vaccines Consider shingrix future Consider shingrix but given allergies use caution  Not immune MMR, hep B titer in future use caution though with h/o allergies with all vaccines 915/21 house calls visit A1c 6.1    h/o supracervical hysterectomy ([facility]), pap 02/29/16 negative no comment HPV, another pap 06/23/17 neg pap neg HPV -h/o LEEP 2006   mammo 08/04/19 negative ordered 08/22/20 negative ordered 2022  2022 pt to call and sch mammogram and dexa   Colonoscopy 04/15/19 normal f/u in 10 years egd sch 10/23/20 Dr. Vicente Males neg bxs 10/23/20 aperistalsis and food + consider gastric emptying study and esophageal mamometry  10/25/20 Dr. Bailey Mech  Dr Jacklynn Lewis,   I will see her next Wednesday 12/01/20 at 8:15 am- march I agree is way too long  , I will order the tests at her office visit after I discuss about it with her .Thank you for reaching out to me.   Yes there has been some case reports of Victoza causing GI tract dysmotility - is holding the medication for a while an option?      Echo Dr. Nehemiah Massed -nl LV function EF 60% moderate mitral insuff and mild  tricuspid insufficiency treadmill stress test neg 07/30/13  -h/o PVCS   Hep C neg 08/17/20 HIV neg 02/04/16 Rec healthy diet and exercise    Ob/gyn appt pending bladder leakage I.e bladder sling vs other [facility] in PT (stopped PT w/o help worsen sneezing or coughing)  -Dr. Georgianne Fick pt wants surgery as of 04/20/20 sx's better and no surgery indicated    Consider referral repeat sleep study h/o OSA f/u Dr. Kathyrn Sheriff and will get procedure for OSA as of 04/17/21  Provider: Dr. Olivia Mackie McLean-Scocuzza-Internal Medicine

## 2021-08-01 NOTE — Patient Instructions (Addendum)
PROBIOTIC Align or Culturelle over the counter   GOLD BOND TALC FREE POWDER  USE CLOTRIMAZOLE CREAM 2X PER DAY IN LEFT GROIN  CALL TO SCHEDULE MAMMOGRAM AND BONE DENSITY   TDAP VACCINE NEXT  Tdap (Tetanus, Diphtheria, Pertussis) Vaccine: What You Need to Know 1. Why get vaccinated? Tdap vaccine can prevent tetanus, diphtheria, and pertussis. Diphtheria and pertussis spread from person to person. Tetanus enters the body through cuts or wounds. TETANUS (T) causes painful stiffening of the muscles. Tetanus can lead to serious health problems, including being unable to open the mouth, having trouble swallowing and breathing, or death. DIPHTHERIA (D) can lead to difficulty breathing, heart failure, paralysis, or death. PERTUSSIS (aP), also known as "whooping cough," can cause uncontrollable, violent coughing that makes it hard to breathe, eat, or drink. Pertussis can be extremely serious especially in babies and young children, causing pneumonia, convulsions, brain damage, or death. In teens and adults, it can cause weight loss, loss of bladder control, passing out, and rib fractures from severe coughing. 2. Tdap vaccine Tdap is only for children 7 years and older, adolescents, and adults.  Adolescents should receive a single dose of Tdap, preferably at age 30 or 34 years. Pregnant people should get a dose of Tdap during every pregnancy, preferably during the early part of the third trimester, to help protect the newborn from pertussis. Infants are most at risk for severe, life-threatening complications from pertussis. Adults who have never received Tdap should get a dose of Tdap. Also, adults should receive a booster dose of either Tdap or Td (a different vaccine that protects against tetanus and diphtheria but not pertussis) every 10 years, or after 5 years in the case of a severe or dirty wound or burn. Tdap may be given at the same time as other vaccines. 3. Talk with your health care  provider Tell your vaccine provider if the person getting the vaccine: Has had an allergic reaction after a previous dose of any vaccine that protects against tetanus, diphtheria, or pertussis, or has any severe, life-threatening allergies Has had a coma, decreased level of consciousness, or prolonged seizures within 7 days after a previous dose of any pertussis vaccine (DTP, DTaP, or Tdap) Has seizures or another nervous system problem Has ever had Guillain-Barr Syndrome (also called "GBS") Has had severe pain or swelling after a previous dose of any vaccine that protects against tetanus or diphtheria In some cases, your health care provider may decide to postpone Tdap vaccination until a future visit. People with minor illnesses, such as a cold, may be vaccinated. People who are moderately or severely ill should usually wait until they recover before getting Tdap vaccine.  Your health care provider can give you more information. 4. Risks of a vaccine reaction Pain, redness, or swelling where the shot was given, mild fever, headache, feeling tired, and nausea, vomiting, diarrhea, or stomachache sometimes happen after Tdap vaccination. People sometimes faint after medical procedures, including vaccination. Tell your provider if you feel dizzy or have vision changes or ringing in the ears.  As with any medicine, there is a very remote chance of a vaccine causing a severe allergic reaction, other serious injury, or death. 5. What if there is a serious problem? An allergic reaction could occur after the vaccinated person leaves the clinic. If you see signs of a severe allergic reaction (hives, swelling of the face and throat, difficulty breathing, a fast heartbeat, dizziness, or weakness), call 9-1-1 and get the person to the  nearest hospital. For other signs that concern you, call your health care provider.  Adverse reactions should be reported to the Vaccine Adverse Event Reporting System (VAERS).  Your health care provider will usually file this report, or you can do it yourself. Visit the VAERS website at www.vaers.SamedayNews.es or call 229 187 5635. VAERS is only for reporting reactions, and VAERS staff members do not give medical advice. 6. The National Vaccine Injury Compensation Program The [employer] Vaccine Injury Compensation Program (VICP) is a federal program that was created to compensate people who may have been injured by certain vaccines. Claims regarding alleged injury or death due to vaccination have a time limit for filing, which may be as short as two years. Visit the VICP website at GoldCloset.com.ee or call (484)110-4034 to learn about the program and about filing a claim. 7. How can I learn more? Ask your health care provider. Call your local or state health department. Visit the website of the Food and Drug Administration (FDA) for vaccine package inserts and additional information at TraderRating.uy. Contact the Centers for Disease Control and Prevention (CDC): Call (860)517-7411 (1-800-CDC-INFO) or Visit CDC's website at http://hunter.com/. Vaccine Information Statement Tdap (Tetanus, Diphtheria, Pertussis) Vaccine (05/11/2020) This information is not intended to replace advice given to you by your health care provider. Make sure you discuss any questions you have with your health care provider. Document Revised: 06/06/2020 Document Reviewed: 06/06/2020 Elsevier Patient Education  2022 [place] Injection What is this medication? SEMAGLUTIDE (SEM a GLOO tide) treats type 2 diabetes. It works by increasing insulin levels in your body, which decreases your blood sugar (glucose). It also reduces the amount of sugar released into the blood and slows down your digestion. It can also be used to lower the risk of heart attack and stroke in people with type 2 diabetes. Changes to diet and exercise are often combined  with this medication. This medicine may be used for other purposes; ask your health care provider or pharmacist if you have questions. COMMON BRAND NAME(S): OZEMPIC What should I tell my care team before I take this medication? They need to know if you have any of these conditions: Endocrine tumors (MEN 2) or if someone in your family had these tumors Eye disease, vision problems History of pancreatitis Kidney disease Stomach problems Thyroid cancer or if someone in your family had thyroid cancer An unusual or allergic reaction to semaglutide, other medications, foods, dyes, or preservatives Pregnant or trying to get pregnant Breast-feeding How should I use this medication? This medication is for injection under the skin of your upper leg (thigh), stomach area, or upper arm. It is given once every week (every 7 days). You will be taught how to prepare and give this medication. Use exactly as directed. Take your medication at regular intervals. Do not take it more often than directed. If you use this medication with insulin, you should inject this medication and the insulin separately. Do not mix them together. Do not give the injections right next to each other. Change (rotate) injection sites with each injection. It is important that you put your used needles and syringes in a special sharps container. Do not put them in a trash can. If you do not have a sharps container, call your pharmacist or care team to get one. A special MedGuide will be given to you by the pharmacist with each prescription and refill. Be sure to read this information carefully each time. This medication comes with INSTRUCTIONS FOR USE.  Ask your pharmacist for directions on how to use this medication. Read the information carefully. Talk to your pharmacist or care team if you have questions. Talk to your care team about the use of this medication in children. Special care may be needed. Overdosage: If you think you have  taken too much of this medicine contact a poison control center or emergency room at once. NOTE: This medicine is only for you. Do not share this medicine with others. What if I miss a dose? If you miss a dose, take it as soon as you can within 5 days after the missed dose. Then take your next dose at your regular weekly time. If it has been longer than 5 days after the missed dose, do not take the missed dose. Take the next dose at your regular time. Do not take double or extra doses. If you have questions about a missed dose, contact your care team for advice. What may interact with this medication? Other medications for diabetes Many medications may cause changes in blood sugar, these include: Alcohol containing beverages Antiviral medications for HIV or AIDS Aspirin and aspirin-like medications Certain medications for blood pressure, heart disease, irregular heart beat Chromium Diuretics Female hormones, such as estrogens or progestins, birth control pills Fenofibrate Gemfibrozil Isoniazid Lanreotide Female hormones or anabolic steroids MAOIs like Carbex, Eldepryl, Marplan, Nardil, and Parnate Medications for weight loss Medications for allergies, asthma, cold, or cough Medications for depression, anxiety, or psychotic disturbances Niacin Nicotine NSAIDs, medications for pain and inflammation, like ibuprofen or naproxen Octreotide Pasireotide Pentamidine Phenytoin Probenecid Quinolone antibiotics such as ciprofloxacin, levofloxacin, ofloxacin Some herbal dietary supplements Steroid medications such as prednisone or cortisone Sulfamethoxazole; trimethoprim Thyroid hormones Some medications can hide the warning symptoms of low blood sugar (hypoglycemia). You may need to monitor your blood sugar more closely if you are taking one of these medications. These include: Beta-blockers, often used for high blood pressure or heart problems (examples include atenolol, metoprolol,  propranolol) Clonidine Guanethidine Reserpine This list may not describe all possible interactions. Give your health care provider a list of all the medicines, herbs, non-prescription drugs, or dietary supplements you use. Also tell them if you smoke, drink alcohol, or use illegal drugs. Some items may interact with your medicine. What should I watch for while using this medication? Visit your care team for regular checks on your progress. Drink plenty of fluids while taking this medication. Check with your care team if you get an attack of severe diarrhea, nausea, and vomiting. The loss of too much body fluid can make it dangerous for you to take this medication. A test called the HbA1C (A1C) will be monitored. This is a simple blood test. It measures your blood sugar control over the last 2 to 3 months. You will receive this test every 3 to 6 months. Learn how to check your blood sugar. Learn the symptoms of low and high blood sugar and how to manage them. Always carry a quick-source of sugar with you in case you have symptoms of low blood sugar. Examples include hard sugar candy or glucose tablets. Make sure others know that you can choke if you eat or drink when you develop serious symptoms of low blood sugar, such as seizures or unconsciousness. They must get medical help at once. Tell your care team if you have high blood sugar. You might need to change the dose of your medication. If you are sick or exercising more than usual, you might need to  change the dose of your medication. Do not skip meals. Ask your care team if you should avoid alcohol. Many nonprescription cough and cold products contain sugar or alcohol. These can affect blood sugar. Pens should never be shared. Even if the needle is changed, sharing may result in passing of viruses like hepatitis or HIV. Wear a medical ID bracelet or chain, and carry a card that describes your disease and details of your medication and dosage  times. Do not become pregnant while taking this medication. Women should inform their care team if they wish to become pregnant or think they might be pregnant. There is a potential for serious side effects to an unborn child. Talk to your care team for more information. What side effects may I notice from receiving this medication? Side effects that you should report to your care team as soon as possible: Allergic reactions-skin rash, itching, hives, swelling of the face, lips, tongue, or throat Change in vision Dehydration-increased thirst, dry mouth, feeling faint or lightheaded, headache, dark yellow or brown urine Gallbladder problems-severe stomach pain, nausea, vomiting, fever Heart palpitations-rapid, pounding, or irregular heartbeat Kidney injury-decrease in the amount of urine, swelling of the ankles, hands, or feet Pancreatitis-severe stomach pain that spreads to your back or gets worse after eating or when touched, fever, nausea, vomiting Thyroid cancer-new mass or lump in the neck, pain or trouble swallowing, trouble breathing, hoarseness Side effects that usually do not require medical attention (report to your care team if they continue or are bothersome): Diarrhea Loss of appetite Nausea Stomach pain Vomiting This list may not describe all possible side effects. Call your doctor for medical advice about side effects. You may report side effects to FDA at 1-800-FDA-1088. Where should I keep my medication? Keep out of the reach of children. Store unopened pens in a refrigerator between 2 and 8 degrees C (36 and 46 degrees F). Do not freeze. Protect from light and heat. After you first use the pen, it can be stored for 56 days at room temperature between 15 and 30 degrees C (59 and 86 degrees F) or in a refrigerator. Throw away your used pen after 56 days or after the expiration date, whichever comes first. Do not store your pen with the needle attached. If the needle is left on,  medication may leak from the pen. NOTE: This sheet is a summary. It may not cover all possible information. If you have questions about this medicine, talk to your doctor, pharmacist, or health care provider.  2022 Elsevier/Gold Standard (2020-11-19 16:01:53)

## 2021-08-02 DIAGNOSIS — G4733 Obstructive sleep apnea (adult) (pediatric): Secondary | ICD-10-CM | POA: Diagnosis not present

## 2021-08-02 DIAGNOSIS — H903 Sensorineural hearing loss, bilateral: Secondary | ICD-10-CM | POA: Diagnosis not present

## 2021-08-03 DIAGNOSIS — G4730 Sleep apnea, unspecified: Secondary | ICD-10-CM | POA: Diagnosis not present

## 2021-08-05 ENCOUNTER — Ambulatory Visit
Admission: RE | Admit: 2021-08-05 | Discharge: 2021-08-05 | Disposition: A | Discharge status: Home / Self Care | Payer: Medicare Other | Source: Ambulatory Visit | Attending: Internal Medicine | Admitting: Internal Medicine

## 2021-08-05 ENCOUNTER — Other Ambulatory Visit: Payer: Self-pay

## 2021-08-05 DIAGNOSIS — I10 Essential (primary) hypertension: Secondary | ICD-10-CM

## 2021-08-05 DIAGNOSIS — E2839 Other primary ovarian failure: Secondary | ICD-10-CM | POA: Insufficient documentation

## 2021-08-05 DIAGNOSIS — Z1231 Encounter for screening mammogram for malignant neoplasm of breast: Secondary | ICD-10-CM | POA: Insufficient documentation

## 2021-08-05 DIAGNOSIS — Z780 Asymptomatic menopausal state: Secondary | ICD-10-CM | POA: Diagnosis not present

## 2021-08-06 NOTE — Addendum Note (Signed)
Addended by: Elpidio Galea T on: 08/06/2021 11:53 AM   Modules accepted: Orders

## 2021-08-08 ENCOUNTER — Telehealth: Payer: Self-pay | Admitting: Internal Medicine

## 2021-08-08 ENCOUNTER — Other Ambulatory Visit: Payer: Self-pay | Admitting: Internal Medicine

## 2021-08-08 DIAGNOSIS — Z87898 Personal history of other specified conditions: Secondary | ICD-10-CM

## 2021-08-08 DIAGNOSIS — I10 Essential (primary) hypertension: Secondary | ICD-10-CM

## 2021-08-08 DIAGNOSIS — F39 Unspecified mood [affective] disorder: Secondary | ICD-10-CM

## 2021-08-08 DIAGNOSIS — G4733 Obstructive sleep apnea (adult) (pediatric): Secondary | ICD-10-CM

## 2021-08-08 NOTE — Telephone Encounter (Signed)
[facility], [place] - [address]  [address], [place] [address]  Phone:  [phone]   Patient informed and verbalized understanding. Patient will call the pharmacy above to check on the status of getting the medication filled.

## 2021-08-08 NOTE — Telephone Encounter (Signed)
Patient called and would like to know her bone density results and mammogram, and sleep study.

## 2021-08-08 NOTE — Telephone Encounter (Signed)
She was to start ozempic sent to pharmacy

## 2021-08-08 NOTE — Telephone Encounter (Signed)
Heather Sweeney, CMA  08/06/2021 11:49 AM EDT     Patient was informed of results.  Patient understood and had no questions, comments, or concerns at this time.   [name], MD  08/05/2021 11:53 AM EDT     Bone density normal except arthritis in lower back

## 2021-08-08 NOTE — Telephone Encounter (Signed)
Heather Glow McLean-Scocuzza, MD  08/06/2021  9:24 AM EDT     Mammogram negative

## 2021-08-08 NOTE — Telephone Encounter (Signed)
Called and informed the Patient of results. Patient informed and verbalized understanding.   Patient states that she saw ENT who recommended her get a 3 day sleep study done but she has not heard back from them. Informed the Patient that she will need to contact their office to see if they have initiated this study for her. Patient verbalized understanding   Patient states she was told to start taking the Victoza injections again. This was discontinued off the Patient's list. Okay to send this back in?

## 2021-08-14 ENCOUNTER — Telehealth: Payer: Self-pay | Admitting: *Deleted

## 2021-08-14 ENCOUNTER — Telehealth: Payer: Medicare Other

## 2021-08-14 NOTE — Telephone Encounter (Signed)
  Care Management   Follow Up Note   08/14/2021 Name: Heather Sweeney MRN: [id number] DOB: 26-Apr-1966   Referred by: McLean-Scocuzza, Nino Glow, MD Reason for referral : Chronic Care Management (Malnutrition, HTN)   An unsuccessful telephone outreach was attempted today. The patient was referred to the case management team for assistance with care management and care coordination.   Follow Up Plan: RNCM will seek assistance from Care Guides in rescheduling appointment within the next 30 days.  Hubert Azure RN, MSN RN Care Management Coordinator [place] [phone] [email or web address]

## 2021-08-15 ENCOUNTER — Telehealth: Payer: Self-pay

## 2021-08-15 DIAGNOSIS — Z79899 Other long term (current) drug therapy: Secondary | ICD-10-CM | POA: Diagnosis not present

## 2021-08-15 NOTE — Chronic Care Management (AMB) (Signed)
  Care Management   Note  08/15/2021 Name: Heather Sweeney MRN: [id number] DOB: [birth year]  Heather Sweeney is a 55 y.o. year old female who is a primary care patient of McLean-Scocuzza, Nino Glow, MD and is actively engaged with the care management team. I reached out to Heather Sweeney by phone today to assist with re-scheduling a follow up visit with the RN Case Manager  Follow up plan: Unsuccessful telephone outreach attempt made. A HIPAA compliant phone message was left for the patient providing contact information and requesting a return call.  The care management team will reach out to the patient again over the next 7 days.  If patient returns call to provider office, please advise to call [facility]  at [place], [place], [place], [place] [address] Direct Dial: [phone] [email or web address]: [email or web address]

## 2021-08-19 ENCOUNTER — Telehealth: Payer: Self-pay | Admitting: *Deleted

## 2021-08-19 NOTE — Telephone Encounter (Signed)
Bmet is ordered and ready to go   [place]

## 2021-08-19 NOTE — Telephone Encounter (Signed)
Please place future orders for lab appt.  

## 2021-08-20 ENCOUNTER — Other Ambulatory Visit: Payer: Medicare Other

## 2021-08-20 NOTE — Telephone Encounter (Signed)
Called and scheduled Patient for 08/22/21 at 3:00 pm

## 2021-08-20 NOTE — Addendum Note (Signed)
Addended by: Orland Mustard on: 08/20/2021 12:55 PM   Modules accepted: Orders

## 2021-08-20 NOTE — Telephone Encounter (Signed)
Pt did not show for labs this morning. But BMP was not ordered correctly. Needs to be ordered "Future"

## 2021-08-20 NOTE — Telephone Encounter (Signed)
Pt needs to reschedule bmet lab appt

## 2021-08-21 NOTE — Chronic Care Management (AMB) (Signed)
  Care Management   Note  08/21/2021 Name: Heather Sweeney MRN: [id number] DOB: August 02, 1966  Heather Sweeney is a 55 y.o. year old female who is a primary care patient of McLean-Scocuzza, Nino Glow, MD and is actively engaged with the care management team. I reached out to Heather Sweeney by phone today to assist with re-scheduling a follow up visit with the Pharmacist  Follow up plan: Unsuccessful telephone outreach attempt made.  The care management team will reach out to the patient again over the next 7 days.  If patient returns call to provider office, please advise to call [facility]  at [place], [place], [place], [place] [address] Direct Dial: [phone] [email or web address] Website: [email or web address]

## 2021-08-22 ENCOUNTER — Other Ambulatory Visit: Payer: Self-pay

## 2021-08-22 ENCOUNTER — Ambulatory Visit: Payer: Medicare Other

## 2021-08-22 ENCOUNTER — Other Ambulatory Visit (INDEPENDENT_AMBULATORY_CARE_PROVIDER_SITE_OTHER): Payer: Medicare Other

## 2021-08-22 DIAGNOSIS — J309 Allergic rhinitis, unspecified: Secondary | ICD-10-CM

## 2021-08-22 DIAGNOSIS — Z6841 Body Mass Index (BMI) 40.0 and over, adult: Secondary | ICD-10-CM

## 2021-08-22 DIAGNOSIS — I10 Essential (primary) hypertension: Secondary | ICD-10-CM | POA: Diagnosis not present

## 2021-08-23 LAB — BASIC METABOLIC PANEL
BUN: 12 mg/dL (ref 6–23)
CO2: 21 mEq/L (ref 19–32)
Calcium: 8.8 mg/dL (ref 8.4–10.5)
Chloride: 109 mEq/L (ref 96–112)
Creatinine, Ser: 1.22 mg/dL — ABNORMAL HIGH (ref 0.40–1.20)
GFR: 50.77 mL/min — ABNORMAL LOW (ref >60.00)
Glucose, Bld: 93 mg/dL (ref 70–99)
Potassium: 3.8 mEq/L (ref 3.5–5.1)
Sodium: 140 mEq/L (ref 135–145)

## 2021-08-26 ENCOUNTER — Telehealth: Payer: Self-pay

## 2021-08-26 DIAGNOSIS — H2513 Age-related nuclear cataract, bilateral: Secondary | ICD-10-CM | POA: Diagnosis not present

## 2021-08-26 NOTE — Progress Notes (Signed)
Sch f/u end of jan 1/27 or early feb 2023  Kidney function improved but still slightly lower than baseline  Avoid NSAIDS Have pt ONLY take furosemide/lasix fluid pill it has leg swelling not daily and only 1/2 pill if needed and if that does not work 1 full pill but only as needed

## 2021-08-26 NOTE — Telephone Encounter (Signed)
LMTCB in regards to lab results.  

## 2021-08-28 ENCOUNTER — Other Ambulatory Visit: Payer: Self-pay

## 2021-08-28 ENCOUNTER — Ambulatory Visit (INDEPENDENT_AMBULATORY_CARE_PROVIDER_SITE_OTHER): Payer: Medicare Other | Admitting: Family

## 2021-08-28 ENCOUNTER — Encounter: Payer: Self-pay | Admitting: Family

## 2021-08-28 VITALS — BP 138/84 | HR 88 | Temp 98.0°F | Resp 16 | Ht 63.0 in | Wt 224.8 lb

## 2021-08-28 DIAGNOSIS — K2100 Gastro-esophageal reflux disease with esophagitis, without bleeding: Secondary | ICD-10-CM | POA: Diagnosis not present

## 2021-08-28 DIAGNOSIS — R1013 Epigastric pain: Secondary | ICD-10-CM

## 2021-08-28 MED ORDER — OMEPRAZOLE 40 MG PO CPDR: 40.0000 mg | DELAYED_RELEASE_CAPSULE | Freq: Every day | ORAL | 3 refills | Status: DC

## 2021-09-01 NOTE — Progress Notes (Signed)
Acute Office Visit  Subjective:    Patient ID: Heather Sweeney, female    DOB: [birth year], 55 y.o.   MRN: [id number]  Chief Complaint  Patient presents with   Gastroesophageal Reflux    HPI Patient is in today with concerns of heartburn and indigestion over the last several months. More recently occurring daily. Has epigastric pain that radiates to her back. Admits to increased burping. Bowel movements are normal.   Past Medical History:  Diagnosis Date   Allergic rhinitis    Asthma    Bipolar disorder ([place])    Chronic low back pain    CTS (carpal tunnel syndrome)    hands   Depression    Gallstones    GERD (gastroesophageal reflux disease)    History of prediabetes    Hypertension    Laceration of right foot    Onychomycosis    OSA on CPAP    Overactive bladder    PVC's (premature ventricular contractions)    PVC's (premature ventricular contractions)    Dr. Raliegh Ip cards   Seizures [facility])    8th grade    Past Surgical History:  Procedure Laterality Date   ABDOMINAL HYSTERECTOMY  07/08/2011   ABDOMINAL HYSTERECTOMY     supracervical in 07/2011    [place]     right hand [place]     2012   CHOLECYSTECTOMY N/A 01/12/2018   Procedure: LAPAROSCOPIC CHOLECYSTECTOMY WITH INTRAOPERATIVE CHOLANGIOGRAM ERAS PATHWAY;  Surgeon: Johnathan Hausen, MD;  Location: [facility] ORS;  Service: General;  Laterality: N/A;   COLONOSCOPY WITH PROPOFOL N/A 04/15/2019   Procedure: COLONOSCOPY WITH PROPOFOL;  Surgeon: Jonathon Bellows, MD;  Location: [facility] ENDOSCOPY;  Service: Gastroenterology;  Laterality: N/A;   DILATION AND CURETTAGE OF UTERUS     04/2011 benign endometrial polyp squamous metaplasia, inflamm, blood, mucous, benign    ESOPHAGOGASTRODUODENOSCOPY (EGD) WITH PROPOFOL N/A 10/23/2020   Procedure: ESOPHAGOGASTRODUODENOSCOPY (EGD) WITH PROPOFOL;  Surgeon: Jonathon Bellows, MD;  Location: [facility] ENDOSCOPY;  Service: Gastroenterology;  Laterality: N/A;   FLEXIBLE SIGMOIDOSCOPY  N/A 02/15/2021   Procedure: FLEXIBLE SIGMOIDOSCOPY;  Surgeon: Jonathon Bellows, MD;  Location: [facility] ENDOSCOPY;  Service: Gastroenterology;  Laterality: N/A;   FOOT SURGERY     Dr. Prudence Davidson    LEEP     2006   TUBAL LIGATION      Family History  Problem Relation Age of Onset   Cancer Father        esophageal    Hypertension Father    Heart failure Mother    Hypertension Mother    Early death Mother    Heart disease Mother        chf   Breast cancer Sister 47   Cancer Sister        breast   Depression Sister    Mental illness Sister    Asthma Son    Cancer Other        brain cancer    Social History   Socioeconomic History   Marital status: Married    Spouse name: Not on file   Number of children: Not on file   Years of education: Not on file   Highest education level: Not on file  Occupational History   Not on file  Tobacco Use   Smoking status: Never   Smokeless tobacco: Current    Types: Snuff   Tobacco comments:    Is in process of decreasing amount  Vaping Use   Vaping Use:  Never used  Substance and Sexual Activity   Alcohol use: No    Alcohol/week: 0.0 standard drinks   Drug use: No   Sexual activity: Not Currently    Birth control/protection: Surgical    Comment: Hysterectomy  Other Topics Concern   Not on file  Social History Narrative   Married    No guns    Wears seat belt    Safe in relationship    Never smoker    2 sons Gaspar Bidding ~10 and another son ~28 as of 10/2019   1 granson eli 1 y.o as of 10/2019   2 sisters    Unemployed       Social Determinants of Health   Financial Resource Strain: Low Risk    Difficulty of Paying Living Expenses: Not hard at all  Food Insecurity: No Food Insecurity   Worried About Charity fundraiser in the Last Year: Never true   Arboriculturist in the Last Year: Never true  Transportation Needs: No Transportation Needs   Lack of Transportation (Medical): No   Lack of Transportation (Non-Medical): No  Physical  Activity: Not on file  Stress: No Stress Concern Present   Feeling of Stress : Not at all  Social Connections: Unknown   Frequency of Communication with Friends and Family: Not on file   Frequency of Social Gatherings with Friends and Family: Not on file   Attends Religious Services: Not on file   Active Member of Clubs or Organizations: Not on file   Attends Archivist Meetings: Not on file   Marital Status: Married  Human resources officer Violence: Not At Risk   Fear of Current or Ex-Partner: No   Emotionally Abused: No   Physically Abused: No   Sexually Abused: No    Outpatient Medications Prior to Visit  Medication Sig Dispense Refill   Accu-Chek Softclix Lancets lancets Use as instructed 100 each 0   amLODipine (NORVASC) 5 MG tablet Take 1 tablet (5 mg total) by mouth daily. 90 tablet 3   benztropine (COGENTIN) 1 MG tablet Take 1 mg by mouth 2 (two) times daily.     Biotin w/ Vitamins C & E (HAIR/SKIN/NAILS PO) Take 2,500 mcg by mouth daily.     Blood Glucose Calibration (TRUE METRIX LEVEL 1) Low SOLN      Blood Glucose Monitoring Suppl (ACCU-CHEK GUIDE ME) w/Device KIT Use as directed,once daily to test blood sugar 1 kit 0   cetirizine (ZYRTEC ALLERGY) 10 MG tablet Take 2 tablets (20 mg total) by mouth daily. 30 tablet 0   clobetasol cream (TEMOVATE) 1.27 % Apply 1 application topically 2 (two) times daily.     clotrimazole (CLOTRIMAZOLE AF) 1 % cream Apply 1 application topically 2 (two) times daily. 60 g 2   Dextromethorphan-Guaifenesin 60-1200 MG 12hr tablet Take 1 tablet by mouth every 12 (twelve) hours. Prn cough green label 30 tablet 0   diphenhydrAMINE (BENADRYL) 25 MG tablet Take 25 mg by mouth every 6 (six) hours as needed.     EASY COMFORT PEN NEEDLES 31G X 8 MM MISC INJECT VICTOZA ONCE DAILY AS DIRECTED 100 each 3   EPINEPHrine 0.3 mg/0.3 mL IJ SOAJ injection Inject 0.3 mg into the muscle as needed for anaphylaxis. 2 each 3   famotidine (PEPCID) 40 MG tablet Take  1 tablet (40 mg total) by mouth daily. 90 tablet 1   fexofenadine (ALLEGRA) 180 MG tablet Take 2 tablets by mouth every morning.  FIBER ADULT GUMMIES PO Take by mouth.     FLUoxetine (PROZAC) 40 MG capsule Take 40 mg by mouth daily.     fluPHENAZine (PROLIXIN) 2.5 MG tablet Take 2.5 mg by mouth 2 (two) times daily.     fluticasone (FLONASE) 50 MCG/ACT nasal spray Place 2 sprays into both nostrils daily. 48 g 3   furosemide (LASIX) 20 MG tablet Take 0.5-1 tablets (10-20 mg total) by mouth daily as needed. 90 tablet 1   glucose blood test strip Use as instructed 100 each 0   hydrocortisone 2.5 % ointment Apply topically 2 (two) times daily. Left lower abdomen/groin 30 g 5   hydroxychloroquine (PLAQUENIL) 200 MG tablet Take 200 mg by mouth 2 (two) times daily.     hydrOXYzine (ATARAX/VISTARIL) 25 MG tablet 1 in the morning, 1 in the afternoon, and 2 in the evening 120 tablet 11   Lancet Devices (EASY MINI EJECT LANCING DEVICE) MISC Use as directed,once daily to test blood sugar 1 each 3   Melatonin 10 MG TABS Take by mouth at bedtime.     meloxicam (MOBIC) 15 MG tablet Take 15 mg by mouth daily.     metoprolol succinate (TOPROL-XL) 25 MG 24 hr tablet TAKE 1 TABLET BY MOUTH ONCE DAILY WITH OR IMMEDIATELY FOLLOWING A MEAL 90 tablet 0   montelukast (SINGULAIR) 10 MG tablet Take 1 tablet (10 mg total) by mouth daily. 90 tablet 3   Multiple Vitamins-Minerals (ALIVE MULTI-VITAMIN PO) Take by mouth daily in the afternoon.     mupirocin ointment (BACTROBAN) 2 % Apply 1 application topically 2 (two) times daily. Prn right knee wound 30 g 2   omalizumab (XOLAIR) 150 MG/ML prefilled syringe Inject 300 mg into the skin every 28 (twenty-eight) days.     oxyCODONE-acetaminophen (PERCOCET) 10-325 MG tablet Take 1 tablet by mouth 3 (three) times daily as needed.     Probiotic Product (PROBIOTIC DAILY PO) Take by mouth. probiogen probiotic daily     QUEtiapine (SEROQUEL XR) 400 MG 24 hr tablet Take 400 mg by  mouth daily.     Semaglutide, 1 MG/DOSE, 4 MG/3ML SOPN Inject 1 mg as directed once a week. Same day. D/c victoza 3 mL 2   topiramate (TOPAMAX) 200 MG tablet Take 200 mg by mouth daily.     vitamin B-12 (CYANOCOBALAMIN) 1000 MCG tablet Take 1,000 mcg by mouth daily.     zinc oxide 20 % ointment Apply 1 application topically 2 (two) times daily as needed for irritation. 60 g 2   No facility-administered medications prior to visit.    Allergies  Allergen Reactions   Cephalexin Swelling and Rash   Hctz [Hydrochlorothiazide] Rash and Swelling    Mild she says   Lisinopril Hives and Other (See Comments)    Headache, Low BP, angioedema     Losartan Hives and Swelling    Angioedema   Penicillins Rash    Has patient had a PCN reaction causing immediate rash, facial/tongue/throat swelling, SOB or lightheadedness with hypotension: Yes Has patient had a PCN reaction causing severe rash involving mucus membranes or skin necrosis: No Has patient had a PCN reaction that required hospitalization: No Has patient had a PCN reaction occurring within the last 10 years: No If all of the above answers are "NO", then may proceed with Cephalosporin use.    Bee Venom     Swelling    Metformin And Related     Rash     Review of Systems  Constitutional: Negative.   Respiratory: Negative.    Cardiovascular: Negative.   Gastrointestinal:  Positive for abdominal pain, blood in stool and constipation.       Heartburn, burping  Musculoskeletal: Negative.   All other systems reviewed and are negative.     Objective:    Physical Exam Vitals and nursing note reviewed.  Constitutional:      Appearance: Normal appearance.  HENT:     Mouth/Throat:     Mouth: Mucous membranes are moist.  Cardiovascular:     Rate and Rhythm: Normal rate and regular rhythm.  Pulmonary:     Effort: Pulmonary effort is normal.     Breath sounds: Normal breath sounds.  Abdominal:     General: Abdomen is flat. Bowel  sounds are normal.     Palpations: Abdomen is soft.     Tenderness: There is no guarding or rebound.  Musculoskeletal:        General: Normal range of motion.     Cervical back: Normal range of motion and neck supple.  Skin:    General: Skin is warm and dry.  Neurological:     Mental Status: She is alert.  Psychiatric:        Mood and Affect: Mood normal.        Behavior: Behavior normal.    BP 138/84   Pulse 88   Temp 98 F (36.7 C) (Oral)   Resp 16   Ht _0  (1.6 m)   Wt 224 lb 12 oz (101.9 kg)   SpO2 98%   BMI 39.81 kg/m  Wt Readings from Last 3 Encounters:  08/28/21 224 lb 12 oz (101.9 kg)  08/01/21 228 lb 6.4 oz (103.6 kg)  04/17/21 239 lb 12.8 oz (108.8 kg)    Health Maintenance Due  Topic Date Due   Zoster Vaccines- Shingrix (1 of 2) Never done   Pneumococcal Vaccine 37-46 Years old (2 - PCV) 07/12/2017   TETANUS/TDAP  04/18/2019    There are no preventive care reminders to display for this patient.   Lab Results  Component Value Date   TSH 1.01 08/01/2021   Lab Results  Component Value Date   WBC 4.6 08/01/2021   HGB 11.5 (L) 08/01/2021   HCT 34.5 (L) 08/01/2021   MCV 81.2 08/01/2021   PLT 187.0 08/01/2021   Lab Results  Component Value Date   NA 140 08/22/2021   K 3.8 08/22/2021   CO2 21 08/22/2021   GLUCOSE 93 08/22/2021   BUN 12 08/22/2021   CREATININE 1.22 (H) 08/22/2021   BILITOT 0.3 08/01/2021   ALKPHOS 73 08/01/2021   AST 17 08/01/2021   ALT 24 08/01/2021   PROT 6.9 08/01/2021   ALBUMIN 4.1 08/01/2021   CALCIUM 8.8 08/22/2021   ANIONGAP 6 01/06/2018   GFR 50.77 (L) 08/22/2021   Lab Results  Component Value Date   CHOL 158 08/01/2021   Lab Results  Component Value Date   HDL 50.90 08/01/2021   Lab Results  Component Value Date   LDLCALC 86 08/01/2021   Lab Results  Component Value Date   TRIG 103.0 08/01/2021   Lab Results  Component Value Date   CHOLHDL 3 08/01/2021   Lab Results  Component Value Date    HGBA1C 5.2 08/01/2021       Assessment & Plan:   Problem List Items Addressed This Visit     Gastroesophageal reflux disease - Primary   Relevant Medications   omeprazole (PRILOSEC) 40  MG capsule   Other Visit Diagnoses     Epigastric pain            Meds ordered this encounter  Medications   omeprazole (PRILOSEC) 40 MG capsule    Sig: Take 1 capsule (40 mg total) by mouth daily.    Dispense:  30 capsule    Refill:  3   Advised bland diet and advance as able to tolerate it. Watch spicy food, fried foods. Incorporate alkaline water into diet. Call the office with any questions or concerns. Recheck as scheduled and sooner as needed.   Kennyth Arnold, FNP

## 2021-09-06 ENCOUNTER — Other Ambulatory Visit: Payer: Self-pay

## 2021-09-06 DIAGNOSIS — Z87898 Personal history of other specified conditions: Secondary | ICD-10-CM

## 2021-09-06 DIAGNOSIS — I10 Essential (primary) hypertension: Secondary | ICD-10-CM

## 2021-09-06 DIAGNOSIS — Z6841 Body Mass Index (BMI) 40.0 and over, adult: Secondary | ICD-10-CM

## 2021-09-06 MED ORDER — SEMAGLUTIDE (1 MG/DOSE) 4 MG/3ML ~~LOC~~ SOPN: 1.0000 mg | PEN_INJECTOR | SUBCUTANEOUS | 2 refills | Status: DC

## 2021-09-10 ENCOUNTER — Encounter: Payer: Self-pay | Admitting: Internal Medicine

## 2021-09-10 ENCOUNTER — Other Ambulatory Visit: Payer: Self-pay

## 2021-09-10 ENCOUNTER — Ambulatory Visit (INDEPENDENT_AMBULATORY_CARE_PROVIDER_SITE_OTHER): Payer: Medicare Other | Admitting: Internal Medicine

## 2021-09-10 VITALS — BP 124/76 | Temp 96.9°F | Ht 63.0 in | Wt 218.0 lb

## 2021-09-10 DIAGNOSIS — E669 Obesity, unspecified: Secondary | ICD-10-CM

## 2021-09-10 DIAGNOSIS — I10 Essential (primary) hypertension: Secondary | ICD-10-CM | POA: Diagnosis not present

## 2021-09-10 DIAGNOSIS — D649 Anemia, unspecified: Secondary | ICD-10-CM

## 2021-09-10 DIAGNOSIS — L818 Other specified disorders of pigmentation: Secondary | ICD-10-CM | POA: Diagnosis not present

## 2021-09-10 DIAGNOSIS — Z23 Encounter for immunization: Secondary | ICD-10-CM | POA: Diagnosis not present

## 2021-09-10 HISTORY — DX: Obesity, unspecified: E66.9

## 2021-09-10 MED ORDER — TETANUS-DIPHTH-ACELL PERTUSSIS 5-2.5-18.5 LF-MCG/0.5 IM SUSP: 0.5000 mL | Freq: Once | INTRAMUSCULAR | 0 refills | Status: AC

## 2021-09-10 MED ORDER — SHINGRIX 50 MCG/0.5ML IM SUSR
0.5000 mL | Freq: Once | INTRAMUSCULAR | 1 refills | Status: AC
Start: 2021-09-10 — End: 2021-09-10

## 2021-09-10 NOTE — Progress Notes (Signed)
Chief Complaint  Patient presents with   Follow-up   F/u  1. Htn on norvasc 5 mg qd and toprol xl 25 mg qd at times lightheaded but reports she is not drinking enough water  2. 10/10 right knee pain will see [facility] 8 am 09/11/21 reviewed with pt if this does not help will rec she seek care ortho either [facility] established or [facility] in [place] Dr. Lyla Glassing or Dr. Alvan Dame  Chronic back pain f/u with pain clinic  3. Obesity with htn switching from victoza 1.8 weekly to ozempic 1 mg weekly but pt had not had any medication in 3 weeks at her choice pharmacy did not mail to her and has not mailed xolair allergy meds  4. Right neck tattoo wants removed will refer to [facility]  5 prevnar given today also needs covid pfizer, shingrix and Tdap vaccines given Rx today  Review of Systems  Constitutional:  Negative for weight loss.  HENT:  Negative for hearing loss.   Eyes:  Negative for blurred vision.  Respiratory:  Negative for shortness of breath.   Cardiovascular:  Negative for chest pain.  Gastrointestinal:  Negative for abdominal pain and blood in stool.  Genitourinary:  Negative for dysuria.  Musculoskeletal:  Positive for falls and joint pain.  Skin:  Negative for rash.  Neurological:  Negative for headaches.  Psychiatric/Behavioral:  Negative for depression.   Past Medical History:  Diagnosis Date   Allergic rhinitis    Asthma    Bipolar disorder ([place])    Chronic low back pain    CTS (carpal tunnel syndrome)    hands   Depression    Gallstones    GERD (gastroesophageal reflux disease)    History of prediabetes    Hypertension    Laceration of right foot    Onychomycosis    OSA on CPAP    Overactive bladder    PVC's (premature ventricular contractions)    PVC's (premature ventricular contractions)    Dr. Raliegh Ip cards   Seizures [facility])    8th grade   Past Surgical History:  Procedure Laterality Date   ABDOMINAL HYSTERECTOMY  07/08/2011   ABDOMINAL HYSTERECTOMY      supracervical in 07/2011    [place]     right hand [place]     2012   CHOLECYSTECTOMY N/A 01/12/2018   Procedure: LAPAROSCOPIC CHOLECYSTECTOMY WITH INTRAOPERATIVE CHOLANGIOGRAM ERAS PATHWAY;  Surgeon: Johnathan Hausen, MD;  Location: [facility] ORS;  Service: General;  Laterality: N/A;   COLONOSCOPY WITH PROPOFOL N/A 04/15/2019   Procedure: COLONOSCOPY WITH PROPOFOL;  Surgeon: Jonathon Bellows, MD;  Location: [facility] ENDOSCOPY;  Service: Gastroenterology;  Laterality: N/A;   DILATION AND CURETTAGE OF UTERUS     04/2011 benign endometrial polyp squamous metaplasia, inflamm, blood, mucous, benign    ESOPHAGOGASTRODUODENOSCOPY (EGD) WITH PROPOFOL N/A 10/23/2020   Procedure: ESOPHAGOGASTRODUODENOSCOPY (EGD) WITH PROPOFOL;  Surgeon: Jonathon Bellows, MD;  Location: [facility] ENDOSCOPY;  Service: Gastroenterology;  Laterality: N/A;   FLEXIBLE SIGMOIDOSCOPY N/A 02/15/2021   Procedure: FLEXIBLE SIGMOIDOSCOPY;  Surgeon: Jonathon Bellows, MD;  Location: [facility] ENDOSCOPY;  Service: Gastroenterology;  Laterality: N/A;   FOOT SURGERY     Dr. Prudence Davidson    LEEP     2006   TUBAL LIGATION     Family History  Problem Relation Age of Onset   Cancer Father        esophageal    Hypertension Father    Heart failure Mother  Hypertension Mother    Early death Mother    Heart disease Mother        chf   Breast cancer Sister 64   Cancer Sister        breast   Depression Sister    Mental illness Sister    Asthma Son    Cancer Other        brain cancer   Social History   Socioeconomic History   Marital status: Married    Spouse name: Not on file   Number of children: Not on file   Years of education: Not on file   Highest education level: Not on file  Occupational History   Not on file  Tobacco Use   Smoking status: Never   Smokeless tobacco: Current    Types: Snuff   Tobacco comments:    Is in process of decreasing amount  Vaping Use   Vaping Use: Never used  Substance and Sexual Activity    Alcohol use: No    Alcohol/week: 0.0 standard drinks   Drug use: No   Sexual activity: Not Currently    Birth control/protection: Surgical    Comment: Hysterectomy  Other Topics Concern   Not on file  Social History Narrative   Married    No guns    Wears seat belt    Safe in relationship    Never smoker    2 sons Gaspar Bidding ~10 and another son ~28 as of 10/2019   1 granson eli 1 y.o as of 10/2019   2 sisters    Unemployed       Social Determinants of Health   Financial Resource Strain: Low Risk    Difficulty of Paying Living Expenses: Not hard at all  Food Insecurity: No Food Insecurity   Worried About Charity fundraiser in the Last Year: Never true   Arboriculturist in the Last Year: Never true  Transportation Needs: No Transportation Needs   Lack of Transportation (Medical): No   Lack of Transportation (Non-Medical): No  Physical Activity: Not on file  Stress: No Stress Concern Present   Feeling of Stress : Not at all  Social Connections: Unknown   Frequency of Communication with Friends and Family: Not on file   Frequency of Social Gatherings with Friends and Family: Not on file   Attends Religious Services: Not on file   Active Member of Clubs or Organizations: Not on file   Attends Archivist Meetings: Not on file   Marital Status: Married  Human resources officer Violence: Not At Risk   Fear of Current or Ex-Partner: No   Emotionally Abused: No   Physically Abused: No   Sexually Abused: No   Current Meds  Medication Sig   Accu-Chek Softclix Lancets lancets Use as instructed   amLODipine (NORVASC) 5 MG tablet Take 1 tablet (5 mg total) by mouth daily.   benztropine (COGENTIN) 1 MG tablet Take 1 mg by mouth 2 (two) times daily.   Biotin w/ Vitamins C & E (HAIR/SKIN/NAILS PO) Take 2,500 mcg by mouth daily.   Blood Glucose Calibration (TRUE METRIX LEVEL 1) Low SOLN    Blood Glucose Monitoring Suppl (ACCU-CHEK GUIDE ME) w/Device KIT Use as directed,once daily to  test blood sugar   buPROPion (WELLBUTRIN XL) 150 MG 24 hr tablet Take 150 mg by mouth every morning.   cetirizine (ZYRTEC ALLERGY) 10 MG tablet Take 2 tablets (20 mg total) by mouth daily.   clobetasol  cream (TEMOVATE) 2.72 % Apply 1 application topically 2 (two) times daily.   clotrimazole (CLOTRIMAZOLE AF) 1 % cream Apply 1 application topically 2 (two) times daily.   Dextromethorphan-Guaifenesin 60-1200 MG 12hr tablet Take 1 tablet by mouth every 12 (twelve) hours. Prn cough green label   diphenhydrAMINE (BENADRYL) 25 MG tablet Take 25 mg by mouth every 6 (six) hours as needed.   EASY COMFORT PEN NEEDLES 31G X 8 MM MISC INJECT VICTOZA ONCE DAILY AS DIRECTED   EPINEPHrine 0.3 mg/0.3 mL IJ SOAJ injection Inject 0.3 mg into the muscle as needed for anaphylaxis.   famotidine (PEPCID) 40 MG tablet Take 1 tablet (40 mg total) by mouth daily.   fexofenadine (ALLEGRA) 180 MG tablet Take 2 tablets by mouth every morning.   FIBER ADULT GUMMIES PO Take by mouth.   FLUoxetine (PROZAC) 40 MG capsule Take 40 mg by mouth daily.   fluPHENAZine (PROLIXIN) 2.5 MG tablet Take 2.5 mg by mouth 2 (two) times daily.   fluticasone (FLONASE) 50 MCG/ACT nasal spray Place 2 sprays into both nostrils daily.   furosemide (LASIX) 20 MG tablet Take 0.5-1 tablets (10-20 mg total) by mouth daily as needed.   glucose blood test strip Use as instructed   hydrocortisone 2.5 % ointment Apply topically 2 (two) times daily. Left lower abdomen/groin   hydroxychloroquine (PLAQUENIL) 200 MG tablet Take 200 mg by mouth 2 (two) times daily.   hydrOXYzine (ATARAX/VISTARIL) 25 MG tablet 1 in the morning, 1 in the afternoon, and 2 in the evening   Lancet Devices (EASY MINI EJECT LANCING DEVICE) MISC Use as directed,once daily to test blood sugar   Melatonin 10 MG TABS Take by mouth at bedtime.   meloxicam (MOBIC) 15 MG tablet Take 15 mg by mouth daily.   metoprolol succinate (TOPROL-XL) 25 MG 24 hr tablet TAKE 1 TABLET BY MOUTH ONCE  DAILY WITH OR IMMEDIATELY FOLLOWING A MEAL   montelukast (SINGULAIR) 10 MG tablet Take 1 tablet (10 mg total) by mouth daily.   Multiple Vitamins-Minerals (ALIVE MULTI-VITAMIN PO) Take by mouth daily in the afternoon.   mupirocin ointment (BACTROBAN) 2 % Apply 1 application topically 2 (two) times daily. Prn right knee wound   omalizumab Arvid Right) 150 MG/ML prefilled syringe Inject 300 mg into the skin every 28 (twenty-eight) days.   omeprazole (PRILOSEC) 40 MG capsule Take 1 capsule (40 mg total) by mouth daily.   oxyCODONE-acetaminophen (PERCOCET) 10-325 MG tablet Take 1 tablet by mouth 3 (three) times daily as needed.   Probiotic Product (PROBIOTIC DAILY PO) Take by mouth. probiogen probiotic daily   QUEtiapine (SEROQUEL XR) 400 MG 24 hr tablet Take 400 mg by mouth daily.   Semaglutide, 1 MG/DOSE, 4 MG/3ML SOPN Inject 1 mg as directed once a week. Same day. D/c victoza   Tdap (BOOSTRIX) 5-2.5-18.5 LF-MCG/0.5 injection Inject 0.5 mLs into the muscle once for 1 dose.   topiramate (TOPAMAX) 200 MG tablet Take 200 mg by mouth daily.   vitamin B-12 (CYANOCOBALAMIN) 1000 MCG tablet Take 1,000 mcg by mouth daily.   zinc oxide 20 % ointment Apply 1 application topically 2 (two) times daily as needed for irritation.   Zoster Vaccine Adjuvanted [facility]) injection Inject 0.5 mLs into the muscle once for 1 dose. X 2 doses 2nd dose 2 to less than 6 months   Allergies  Allergen Reactions   Cephalexin Swelling and Rash   Hctz [Hydrochlorothiazide] Rash and Swelling    Mild she says   Lisinopril Hives and  Other (See Comments)    Headache, Low BP, angioedema     Losartan Hives and Swelling    Angioedema   Penicillins Rash    Has patient had a PCN reaction causing immediate rash, facial/tongue/throat swelling, SOB or lightheadedness with hypotension: Yes Has patient had a PCN reaction causing severe rash involving mucus membranes or skin necrosis: No Has patient had a PCN reaction that required  hospitalization: No Has patient had a PCN reaction occurring within the last 10 years: No If all of the above answers are "NO", then may proceed with Cephalosporin use.    Bee Venom     Swelling    Metformin And Related     Rash    Recent Results (from the past 2160 hour(s))  Comprehensive metabolic panel     Status: Abnormal   Collection Time: 08/01/21 12:02 PM  Result Value Ref Range   Sodium 139 135 - 145 mEq/L   Potassium 3.5 3.5 - 5.1 mEq/L   Chloride 107 96 - 112 mEq/L   CO2 24 19 - 32 mEq/L   Glucose, Bld 92 70 - 99 mg/dL   BUN 6 6 - 23 mg/dL   Creatinine, Ser 1.29 (H) 0.40 - 1.20 mg/dL   Total Bilirubin 0.3 0.2 - 1.2 mg/dL   Alkaline Phosphatase 73 39 - 117 U/L   AST 17 0 - 37 U/L   ALT 24 0 - 35 U/L   Total Protein 6.9 6.0 - 8.3 g/dL   Albumin 4.1 3.5 - 5.2 g/dL   GFR 47.51 (L) >60.00 mL/min    Comment: Calculated using the CKD-EPI Creatinine Equation (2021)   Calcium 8.9 8.4 - 10.5 mg/dL  Lipid panel     Status: None   Collection Time: 08/01/21 12:02 PM  Result Value Ref Range   Cholesterol 158 0 - 200 mg/dL    Comment: ATP III Classification       Desirable:  < 200 mg/dL               Borderline High:  200 - 239 mg/dL          High:  > = 240 mg/dL   Triglycerides 103.0 0.0 - 149.0 mg/dL    Comment: Normal:  <150 mg/dLBorderline High:  150 - 199 mg/dL   HDL 50.90 >39.00 mg/dL   VLDL 20.6 0.0 - 40.0 mg/dL   LDL Cholesterol 86 0 - 99 mg/dL   Total CHOL/HDL Ratio 3     Comment:                Men          Women1/2 Average Risk     3.4          3.3Average Risk          5.0          4.42X Average Risk          9.6          7.13X Average Risk          15.0          11.0                       NonHDL 106.75     Comment: NOTE:  Non-HDL goal should be 30 mg/dL higher than patient's LDL goal (i.e. LDL goal of < 70 mg/dL, would have non-HDL goal of < 100 mg/dL)  CBC w/Diff  Status: Abnormal   Collection Time: 08/01/21 12:02 PM  Result Value Ref Range   WBC 4.6 4.0 -  10.5 K/uL   RBC 4.25 3.87 - 5.11 Mil/uL   Hemoglobin 11.5 (L) 12.0 - 15.0 g/dL   HCT 34.5 (L) 36.0 - 46.0 %   MCV 81.2 78.0 - 100.0 fl   MCHC 33.3 30.0 - 36.0 g/dL   RDW 14.7 11.5 - 15.5 %   Platelets 187.0 150.0 - 400.0 K/uL   Neutrophils Relative % 52.6 43.0 - 77.0 %   Lymphocytes Relative 34.8 12.0 - 46.0 %   Monocytes Relative 8.5 3.0 - 12.0 %   Eosinophils Relative 3.3 0.0 - 5.0 %   Basophils Relative 0.8 0.0 - 3.0 %   Neutro Abs 2.4 1.4 - 7.7 K/uL   Lymphs Abs 1.6 0.7 - 4.0 K/uL   Monocytes Absolute 0.4 0.1 - 1.0 K/uL   Eosinophils Absolute 0.2 0.0 - 0.7 K/uL   Basophils Absolute 0.0 0.0 - 0.1 K/uL  TSH     Status: None   Collection Time: 08/01/21 12:02 PM  Result Value Ref Range   TSH 1.01 0.35 - 5.50 uIU/mL  Hemoglobin A1c     Status: None   Collection Time: 08/01/21 12:02 PM  Result Value Ref Range   Hgb A1c MFr Bld 5.2 4.6 - 6.5 %    Comment: Glycemic Control Guidelines for People with Diabetes:Non Diabetic:  <6%Goal of Therapy: <7%Additional Action Suggested:  >1%   Basic Metabolic Panel (BMET)     Status: Abnormal   Collection Time: 08/22/21  2:43 PM  Result Value Ref Range   Sodium 140 135 - 145 mEq/L   Potassium 3.8 3.5 - 5.1 mEq/L   Chloride 109 96 - 112 mEq/L   CO2 21 19 - 32 mEq/L   Glucose, Bld 93 70 - 99 mg/dL   BUN 12 6 - 23 mg/dL   Creatinine, Ser 1.22 (H) 0.40 - 1.20 mg/dL   GFR 50.77 (L) >60.00 mL/min    Comment: Calculated using the CKD-EPI Creatinine Equation (2021)   Calcium 8.8 8.4 - 10.5 mg/dL   Objective  Body mass index is 38.62 kg/m. Wt Readings from Last 3 Encounters:  09/10/21 218 lb (98.9 kg)  08/28/21 224 lb 12 oz (101.9 kg)  08/01/21 228 lb 6.4 oz (103.6 kg)   Temp Readings from Last 3 Encounters:  09/10/21 (!) 96.9 F (36.1 C) (Temporal)  08/28/21 98 F (36.7 C) (Oral)  08/01/21 98.4 F (36.9 C) (Oral)   BP Readings from Last 3 Encounters:  09/10/21 124/76  08/28/21 138/84  08/01/21 128/80   Pulse Readings from Last 3  Encounters:  08/28/21 88  08/01/21 (!) 101  04/17/21 78    Physical Exam Vitals and nursing note reviewed.  Constitutional:      Appearance: Normal appearance. She is well-developed and well-groomed.  HENT:     Head: Normocephalic and atraumatic.  Eyes:     Conjunctiva/sclera: Conjunctivae normal.     Pupils: Pupils are equal, round, and reactive to light.  Cardiovascular:     Rate and Rhythm: Normal rate and regular rhythm.     Heart sounds: Normal heart sounds. No murmur heard. Pulmonary:     Effort: Pulmonary effort is normal.     Breath sounds: Normal breath sounds.  Abdominal:     General: Abdomen is flat. Bowel sounds are normal.     Tenderness: There is no abdominal tenderness.  Musculoskeletal:  General: No tenderness.  Skin:    General: Skin is warm and dry.  Neurological:     General: No focal deficit present.     Mental Status: She is alert and oriented to person, place, and time. Mental status is at baseline.     Cranial Nerves: Cranial nerves 2-12 are intact.     Gait: Gait is intact.     Comments: Walking with cane today  Psychiatric:        Attention and Perception: Attention and perception normal.        Mood and Affect: Mood and affect normal.        Speech: Speech normal.        Behavior: Behavior normal. Behavior is cooperative.        Thought Content: Thought content normal.        Cognition and Memory: Cognition and memory normal.        Judgment: Judgment normal.    Assessment  Plan  Hypertension, unspecified type On norvasc 5 mg qd and toprol xl 25 mg qd   Tattoo - Plan: Ambulatory referral to Dermatology Dr. Lanier Prude   Obesity (BMI 30-39.9) Given sample ozempic today 0.25 weekly x 2 weeks then increase to 0.5 weekly until sample gone then 1 mg weekly    HM Flu shot utd Tdap rx [place] 2/2 consider booster rec  Prevnar today given Consider shingrix future rx  Not immune MMR, hep B titer consider in the future  915/21  house calls visit A1c 6.1    h/o supracervical hysterectomy ([facility]), pap 02/29/16 negative no comment HPV, another pap 06/23/17 neg pap neg HPV -h/o LEEP 2006   mammo 08/04/19 negative ordered 08/22/20 negative ordered 2022  08/05/21 negative    Colonoscopy 04/15/19 normal f/u in 10 years egd sch 10/23/20 Dr. Vicente Males neg bxs 10/23/20 aperistalsis and food + consider gastric emptying study and esophageal mamometry  10/25/20 Dr. Bailey Mech  Dr Jacklynn Lewis,    Echo Dr. Nehemiah Massed -nl LV function EF 60% moderate mitral insuff and mild tricuspid insufficiency treadmill stress test neg 07/30/13  -h/o PVCS   Hep C neg 08/17/20 HIV neg 02/04/16 Rec healthy diet and exercise    Ob/gyn appt pending bladder leakage I.e bladder sling vs other [facility] in PT (stopped PT w/o help worsen sneezing or coughing)  -Dr. Georgianne Fick pt wants surgery as of 04/20/20 sx's better and no surgery indicated    Consider referral repeat sleep study h/o OSA f/u Dr. Kathyrn Sheriff and will get procedure for OSA as of 04/17/21   Provider: Dr. Olivia Mackie McLean-Scocuzza-Internal Medicine

## 2021-09-10 NOTE — Patient Instructions (Addendum)
Ozempic 0.25 1x per week x 2 weeks then increase to 0.5 until sample gone then start 1 mg weekly   Consider pfizer doses 2 more at the pharmacy over time Tdap x 1 dose at the pharmacy  Consider shingrix x 2 doses at the pharmacy   Dr. Lanier Prude dermatologist removes tattoos [place] [employer] [place] [phone]   [facility]  Call and see if they do tattoo removal [facility] and if they take insurance [phone]  [place]  Call and see if they do tattoo removal and if they take your insurance    Dr Alvan Dame or Dr. Rod Can orthopedics call for appt right knee if not better Phone Fax E-mail Address  [phone] Not available [address]   [address]   [place] [address]     Specialties     Orthopedic Surgery        MRI 11/19/16  1.  Mild to moderate proximal medial collateral ligament sprain, right knee.  2.  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  Regions of moderate chondrosis in the tibiofemoral compartments.  3.  Small knee effusion.  4.  Intact menisci. Narrative  EXAM: MRI LOWER EXTREMITY JOINT RIGHT WO CONTRAST  DATE: 11/19/2016 4:38 PM  ACCESSION: [id number]  DICTATED: 11/19/2016 4:39 PM  INTERPRETATION LOCATION: [facility]: 55 years old Female with OTHER-M25.561-Right knee pain, unspecified chronicity     COMPARISON: None.   TECHNIQUE: MRI of the  right knee was performed without administration of IV contrast using a local coil.  Multisequence, multiplanar images were obtained.   FINDINGS:  Medial compartment: The medial meniscus is normal. Moderate weight-bearing region chondrosis is present.  The subchondral bone is normal. Mild medial tibiofemoral osteophytosis.   Lateral compartment: The lateral meniscus is intact.  5 mm diameter region of moderate tibial chondrosis adjacent to the intercondylar notch. The subchondral bone  is normal.  Mild lateral tibiofemoral osteophytosis.   Patellofemoral compartment: Severe inferior patellar chondrosis with chronic associated subchondral bone changes. Severe central femoral trochlear chondrosis is also present with associated subchondral bone marrow edema.   Mild thickening and increased signal is present in the proximal medial collateral ligament.  The other ligaments of the knee are intact.  The extensor mechanism is normal.  The medial and lateral patellar retinacula are normal.   Small knee effusion. No popliteal cyst is present.  There are no loose intra-articular bodies. T2 hyperintense focus in the proximal tibia is likely a cartilage rest. Other Result Text  Interface, Rad Results In - 11/19/2016  5:08 PM EST  Formatting of this note might be different from the original.  EXAM: MRI LOWER EXTREMITY JOINT RIGHT WO CONTRAST  DATE: 11/19/2016 4:38 PM  ACCESSION: [id number]  DICTATED: 11/19/2016 4:39 PM  INTERPRETATION LOCATION: [place]: 55 years old Female with OTHER-M25.561-Right knee pain, unspecified chronicity     COMPARISON: None.   TECHNIQUE: MRI of the  right knee was performed without administration of IV contrast using a local coil.  Multisequence, multiplanar images were obtained.   FINDINGS:  Medial compartment: The medial meniscus is normal. Moderate weight-bearing region chondrosis is present.  The subchondral bone is normal. Mild medial tibiofemoral osteophytosis.   Lateral compartment: The lateral meniscus is intact.  5 mm diameter region of moderate tibial chondrosis adjacent to the intercondylar notch. The subchondral bone is normal.  Mild lateral tibiofemoral osteophytosis.   Patellofemoral compartment: Severe inferior patellar chondrosis with chronic associated subchondral bone changes. Severe central femoral trochlear chondrosis is also present with associated subchondral bone marrow edema.   Mild thickening and  increased signal is present in the proximal medial collateral ligament.  The other ligaments of the knee are intact.  The extensor mechanism is normal.  The medial and lateral patellar retinacula are normal.   Small knee effusion. No popliteal cyst is present.  There are no loose intra-articular bodies. T2 hyperintense focus in the proximal tibia is likely a cartilage rest.     IMPRESSION:  1.  Mild to moderate proximal medial collateral ligament sprain, right knee.  2.  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  Regions of moderate chondrosis in the tibiofemoral compartments.  3.  Small knee effusion.  4.  Intact menisci. Specimen Collected: -- Last Resulted: --  Date: 11/19/16   Received From: [facility]

## 2021-09-11 ENCOUNTER — Ambulatory Visit: Payer: Medicare Other | Admitting: Internal Medicine

## 2021-09-11 ENCOUNTER — Telehealth: Payer: Self-pay | Admitting: Internal Medicine

## 2021-09-11 NOTE — Telephone Encounter (Signed)
3rd unsuccessful outreach  

## 2021-09-11 NOTE — Chronic Care Management (AMB) (Signed)
  Care Management   Note  09/11/2021 Name: Heather Sweeney MRN: [id number] DOB: November 11, 1965  Heather Sweeney is a 55 y.o. year old female who is a primary care patient of McLean-Scocuzza, Nino Glow, MD and is actively engaged with the care management team. I reached out to Heather Sweeney by phone today to assist with re-scheduling a follow up visit with the RN Case Manager  Follow up plan: Unable to make contact on outreach attempts x 3. PCP McLean-Scocuzza, Nino Glow, MD notified via routed documentation in medical record.   Noreene Larsson, [place], [place], [place] [address] Direct Dial: [phone] [email or web address]

## 2021-09-11 NOTE — Telephone Encounter (Signed)
07/2021 3 boxes ozempic should have been [facility] not due until 10/2020 to refill  Stop victoza   Have her call her allergy doctor [facility] for xolair they dont have a rx for this  Will need to call [facility]

## 2021-09-11 NOTE — Addendum Note (Signed)
Addended by: Orland Mustard on: 09/11/2021 05:09 PM   Modules accepted: Orders

## 2021-09-13 ENCOUNTER — Ambulatory Visit: Payer: Medicare Other | Admitting: Pharmacist

## 2021-09-13 ENCOUNTER — Other Ambulatory Visit: Payer: Self-pay

## 2021-09-13 DIAGNOSIS — E669 Obesity, unspecified: Secondary | ICD-10-CM

## 2021-09-13 DIAGNOSIS — F39 Unspecified mood [affective] disorder: Secondary | ICD-10-CM

## 2021-09-13 DIAGNOSIS — I10 Essential (primary) hypertension: Secondary | ICD-10-CM

## 2021-09-13 DIAGNOSIS — Z87898 Personal history of other specified conditions: Secondary | ICD-10-CM

## 2021-09-13 NOTE — Chronic Care Management (AMB) (Signed)
Chronic Care Management CCM Pharmacy Note  09/13/2021 Name:  Heather Sweeney MRN:  [id number] DOB:  May 21, 1966  Summary: - Tolerating regimen well. Organizing medications well. - Restarting Ozempic for weight management  Recommendations/Changes made from today's visit: - Reiterated Ozempic dosing per PCP - Reviewed vaccine recommendations - Scheduled f/u with PCP  Subjective: Heather Sweeney is an 55 y.o. year old female who is a primary patient of McLean-Scocuzza, Nino Glow, MD.  The CCM team was consulted for assistance with disease management and care coordination needs.    Engaged with patient face to face for follow up visit for pharmacy case management and/or care coordination services.   Objective:  Medications Reviewed Today     Reviewed by De Hollingshead, RPH-CPP (Pharmacist) on 09/13/21 at [phone]  Med List Status: <None>   Medication Order Taking? Sig Documenting Provider Last Dose Status Informant  Accu-Chek Softclix Lancets lancets [id number]  Use as instructed Einar Pheasant, MD  Active   amLODipine (NORVASC) 5 MG tablet [id number]  Take 1 tablet (5 mg total) by mouth daily. McLean-Scocuzza, Nino Glow, MD  Active   benztropine (COGENTIN) 1 MG tablet [id number]  Take 1 mg by mouth 2 (two) times daily. [provider]  Active Self  Biotin w/ Vitamins C & E (HAIR/SKIN/NAILS PO) [id number]  Take 2,500 mcg by mouth daily. [provider]  Active   Blood Glucose Calibration (TRUE METRIX LEVEL 1) Low SOLN [id number]   [provider]  Active Self  Blood Glucose Monitoring Suppl (ACCU-CHEK GUIDE ME) w/Device KIT [id number]  Use as directed,once daily to test blood sugar Crecencio Mc, MD  Active   buPROPion (WELLBUTRIN XL) 150 MG 24 hr tablet [id number]  Take 150 mg by mouth every morning. [provider]  Active   cetirizine (ZYRTEC ALLERGY) 10 MG tablet [id number]  Take 2 tablets (20 mg total) by mouth daily. McLean-Scocuzza, Nino Glow, MD   Active Self  clobetasol cream (TEMOVATE) 0.05 % [id number]  Apply 1 application topically 2 (two) times daily. [provider]  Active   clotrimazole (CLOTRIMAZOLE AF) 1 % cream [id number]  Apply 1 application topically 2 (two) times daily. McLean-Scocuzza, Nino Glow, MD  Active   Dextromethorphan-Guaifenesin 60-1200 MG 12hr tablet [id number]  Take 1 tablet by mouth every 12 (twelve) hours. Prn cough green label McLean-Scocuzza, Nino Glow, MD  Active   diphenhydrAMINE (BENADRYL) 25 MG tablet [id number]  Take 25 mg by mouth every 6 (six) hours as needed. [provider]  Active Self  EASY COMFORT PEN NEEDLES 31G X 8 MM MISC [id number]  INJECT VICTOZA ONCE DAILY AS DIRECTED McLean-Scocuzza, Nino Glow, MD  Active   EPINEPHrine 0.3 mg/0.3 mL IJ SOAJ injection [id number]  Inject 0.3 mg into the muscle as needed for anaphylaxis. McLean-Scocuzza, Nino Glow, MD  Active   famotidine (PEPCID) 40 MG tablet [id number]  Take 1 tablet (40 mg total) by mouth daily. Jonathon Bellows, MD  Active            Med Note Heather Sweeney, Arville Lime   Wed May 29, 2021 10:03 AM)    fexofenadine (ALLEGRA) 180 MG tablet [id number]  Take 2 tablets by mouth every morning. [provider]  Active Self  FIBER ADULT GUMMIES PO [id number]  Take by mouth. [provider]  Active   FLUoxetine (PROZAC) 40 MG capsule [id number]  Take 40 mg by mouth daily. [provider]  Active Self  fluPHENAZine (PROLIXIN) 2.5 MG tablet [id number]  Take  2.5 mg by mouth 2 (two) times daily. [provider]  Active   fluticasone (FLONASE) 50 MCG/ACT nasal spray [id number]  Place 2 sprays into both nostrils daily. McLean-Scocuzza, Nino Glow, MD  Active   furosemide (LASIX) 20 MG tablet [id number]  Take 0.5-1 tablets (10-20 mg total) by mouth daily as needed. McLean-Scocuzza, Nino Glow, MD  Active   glucose blood test strip [id number]  Use as instructed Einar Pheasant, MD  Active   hydrocortisone 2.5 % ointment [id number]  Apply topically  2 (two) times daily. Left lower abdomen/groin McLean-Scocuzza, Nino Glow, MD  Active   hydroxychloroquine (PLAQUENIL) 200 MG tablet [id number]  Take 200 mg by mouth 2 (two) times daily. [provider]  Active   hydrOXYzine (ATARAX/VISTARIL) 25 MG tablet [id number]  1 in the morning, 1 in the afternoon, and 2 in the evening McLean-Scocuzza, Nino Glow, MD  Active   Lancet Devices (EASY MINI EJECT LANCING DEVICE) MISC [id number]  Use as directed,once daily to test blood sugar McLean-Scocuzza, Nino Glow, MD  Active   Melatonin 10 MG TABS [id number]  Take by mouth at bedtime. [provider]  Active   meloxicam (MOBIC) 15 MG tablet [id number]  Take 15 mg by mouth daily. [provider]  Active            Med Note Heather Sweeney Mar 25, 2021 10:39 AM) Reports taking as needed  metoprolol succinate (TOPROL-XL) 25 MG 24 hr tablet [id number]  TAKE 1 TABLET BY MOUTH ONCE DAILY WITH OR IMMEDIATELY FOLLOWING A MEAL McLean-Scocuzza, Nino Glow, MD  Active   montelukast (SINGULAIR) 10 MG tablet [id number]  Take 1 tablet (10 mg total) by mouth daily. McLean-Scocuzza, Nino Glow, MD  Active   Multiple Vitamins-Minerals (ALIVE MULTI-VITAMIN PO) [id number]  Take by mouth daily in the afternoon. [provider]  Active Self  mupirocin ointment (BACTROBAN) 2 % [id number]  Apply 1 application topically 2 (two) times daily. Prn right knee wound McLean-Scocuzza, Nino Glow, MD  Active   omalizumab Arvid Right) 150 MG/ML prefilled syringe [id number]  Inject 300 mg into the skin every 28 (twenty-eight) days. [provider]  Active   omeprazole (PRILOSEC) 40 MG capsule [id number]  Take 1 capsule (40 mg total) by mouth daily. Dutch Quint B, FNP  Active   oxyCODONE-acetaminophen (PERCOCET) 10-325 MG tablet [id number]  Take 1 tablet by mouth 3 (three) times daily as needed. [provider]  Active Self  Probiotic Product (PROBIOTIC DAILY PO) [id number]  Take by mouth. probiogen probiotic daily  [provider]  Active Self  QUEtiapine (SEROQUEL XR) 400 MG 24 hr tablet [id number]  Take 400 mg by mouth daily. [provider]  Active   Semaglutide, 1 MG/DOSE, 4 MG/3ML SOPN [id number]  Inject 1 mg as directed once a week. Same day. D/c victoza McLean-Scocuzza, Nino Glow, MD  Active   topiramate (TOPAMAX) 200 MG tablet [id number]  Take 200 mg by mouth daily. [provider]  Active Self  vitamin B-12 (CYANOCOBALAMIN) 1000 MCG tablet [id number]  Take 1,000 mcg by mouth daily. [provider]  Active   zinc oxide 20 % ointment [id number]  Apply 1 application topically 2 (two) times daily as needed for irritation. McLean-Scocuzza, Nino Glow, MD  Active             Pertinent Labs:   Lab Results  Component Value Date   HGBA1C 5.2 08/01/2021   Lab Results  Component Value Date   CHOL 158  08/01/2021   HDL 50.90 08/01/2021   LDLCALC 86 08/01/2021   TRIG 103.0 08/01/2021   CHOLHDL 3 08/01/2021   Lab Results  Component Value Date   CREATININE 1.22 (H) 08/22/2021   BUN 12 08/22/2021   NA 140 08/22/2021   K 3.8 08/22/2021   CL 109 08/22/2021   CO2 21 08/22/2021    SDOH:  (Social Determinants of Health) assessments and interventions performed:  SDOH Interventions    Flowsheet Row Most Recent Value  SDOH Interventions   Financial Strain Interventions Intervention Not Indicated       CCM Care Plan  Review of patient past medical history, allergies, medications, health status, including review of consultants reports, laboratory and other test data, was performed as part of comprehensive evaluation and provision of chronic care management services.   Care Plan : Medication Management  Updates made by De Hollingshead, RPH-CPP since 09/13/2021 12:00 AM     Problem: Asthma, Allergies, Mood Disorder, Chronic Pain      Long-Range Goal: Disease Progression Prevention   Start Date: 02/11/2021  Recent Progress: On track  Priority: High  Note:    Current Barriers:  Complex patient with multiple comorbidities at high risk of exacerbation  Pharmacist Clinical Goal(s):  Over the next 90 days, patient will achieve adherence to monitoring guidelines and medication adherence to achieve therapeutic efficacy through collaboration with PharmD and provider.   Interventions: 1:1 collaboration with McLean-Scocuzza, Nino Glow, MD regarding development and update of comprehensive plan of care as evidenced by provider attestation and co-signature Inter-disciplinary care team collaboration (see longitudinal plan of care) Comprehensive medication review performed; medication list updated in electronic medical record  Health Maintenance   Yearly influenza vaccination: up to date Td/Tdap vaccination: due - recommended to pursue  Pneumonia vaccination: up to date COVID vaccinations: due - recommended to pursue updated booster. States she plans to get today Shingrix vaccinations: due - recommended to pursue in 2023 Colonoscopy: up to date Bone density scan: up to date Mammogram: up to date Due to schedule PCP f/u. Assisted in doing so today Re-engage with RN CM  ASCVD Risk Reduction: Consideration for improved control moving forward; 10 year ASCVD risk 7.8% Previously on pravastatin therapy. Appears it was discontinued at the time of urticaria, though further dermatologic testing diagnosed as hives and appeared to indicate it was not a drug related eruption.  Consider benefit vs risk of re-initiation of statin therapy moving forward.   History of Pre-Diabetes/Obesity : Improved per patient report; current regimen: Ozempic 0.25 mg weekly, plan to continue for 2 weeks, then increase to 0.5 mg weekly for 3 weeks, then increase to 1 mg weekly.  Denies any GI upset, nausea, constipation.  Reiterated dosing plan. Advised to call [facility] pharmacy to refill Ozempic 1 mg when able.   Hypertension: Controlled at last office visit; current treatment:  amlodipine 5 mg, metoprolol succinate 25 mg daily; furosemide 10 mg daily PRN edema Current home readings: did not bring readings with her today. Notes some lightheadedness/dizziness, but seems to be more related to knee pain  Recommended to continue current regimen at this time. Advised on signs and symptoms of hypotension  Asthma/Allergies/uricaria  Improved per patient report; current treatment: Xolair 300 mg, hydroxychloroquine 200 mg BID, fexofenadine 360 mg QAM, cetirizine 20 mg QPM, montelukast 10 mg daily, hydroxyzine 25 mg QAM, 25 mg Qafternoon, 50 mg QPM, fluticasone nasal spray BID, topical clobetasol 0.05% ointment, topical hydrocortisone 2.5% ointment PRN, diphenhydramine PRN; follows w/ [facility]  Allergy, Dermatology Encouraged to continue current regimen at this time along with collaboration with [facility] specialists.   Mood Disorder Moderately well controlled; current treatment: fluoxetine 40 mg daily, fluphenazine 2.5 mg BID benztropine 1 mg BID, topiramate 200 mg daily, hydroxyzine 25 mg as above; follows w/ [facility]  Previously recommended to continue current regimen at this time, along with collaboration with psych.   Pain: Moderately well managed; current regimen: oxycodone/APAP 10/325 mg TID - pain management, meloxicam 15 mg daily PRN - orthopedics Reports she received injections yesterday Previously recommended to continue current regimen at this time. Reviewed to use meloxicam PRN as prescribed  Supplements: Gummies - biotin, multivitamin, probiotic, fiber, melatonin  Patient Goals/Self-Care Activities Over the next 90 days, patient will:  - take medications as prescribed focus on medication adherence by using medication list provided today      Plan: Telephone follow up appointment with care management team member scheduled for:  8 weeks  Catie Heather Sweeney, PharmD, [place], [place] Clinical Pharmacist [employer] at [employer] [phone]

## 2021-09-13 NOTE — Patient Instructions (Addendum)
Emmelyn,   Keep up the great work!  I do not believe you need to worry about checking blood sugars at this time. Continue the plan of Ozempic 0.25 mg weekly for 2 weeks, (take 0.25 mg on the 14th), then increase to 0.5 mg weekly (on the 21st) for a total of 3 weeks, then increase to 1 mg weekly. You should be able to refill the Ozempic by this time from [place].   Keep checking your blood pressures at home periodically.   Let us know when you have gotten your updated COVID booster from the pharmacy.   In January, talk to your pharmacy about the 1) Shingrix vaccine and 2) the Tdap vaccine.   Our nurse, Sheppard Evens, has been trying to call you. Please call our scheduler, Luetta Nutting, at [phone] to schedule a follow up phone call with our nurse.   Call me if you have any issues in the meantime!  Catie [name], PharmD  Visit Information  Following are the goals we discussed today:  Patient Goals/Self-Care Activities Over the next 90 days, patient will:  - take medications as prescribed focus on medication adherence by using medication list provided today        Plan: Telephone follow up appointment with care management team member scheduled for:  8 weeks   Catie [name], PharmD, [place], CPP Clinical Pharmacist [place] at [facility] [phone]     Please call the care guide team at [phone] if you need to cancel or reschedule your appointment.   Print copy of patient instructions, educational materials, and care plan provided in person.

## 2021-09-19 NOTE — Telephone Encounter (Signed)
Called pharmacy and discontinued Victoza, clarified for them that she should be on the Ozempic.   Xolair was not on file with pharmacy. Called and informed the Patient. Patient verbalized understanding and will call [facility] to have them sen in the medication.

## 2021-09-28 ENCOUNTER — Other Ambulatory Visit: Payer: Self-pay | Admitting: Internal Medicine

## 2021-10-01 ENCOUNTER — Telehealth: Payer: Self-pay | Admitting: Internal Medicine

## 2021-10-01 NOTE — Telephone Encounter (Signed)
Rejection Reason - Patient did not respond" Heather Sweeney said on Sep 23, 2021 2:16 PM  Msg sent to provider

## 2021-10-02 ENCOUNTER — Ambulatory Visit: Payer: Medicare Other

## 2021-10-02 ENCOUNTER — Telehealth: Payer: Self-pay

## 2021-10-02 NOTE — Telephone Encounter (Signed)
Unable to reach patient for scheduled AWV. No voicemail available. Reschedule.

## 2021-10-11 ENCOUNTER — Telehealth: Payer: Self-pay | Admitting: Internal Medicine

## 2021-10-11 NOTE — Telephone Encounter (Signed)
Pt called in stating she has a tickle in her throat and she was seen for this before and she would like to get the same medication. Pt said it did work the last time she had the medication

## 2021-10-11 NOTE — Telephone Encounter (Signed)
Patient called in stated returning call back, please call patient @ [phone]

## 2021-10-14 NOTE — Telephone Encounter (Signed)
Left message to return call.  Patient will need to schedule an appointment as this is an acute issue.

## 2021-10-22 ENCOUNTER — Telehealth: Payer: Self-pay | Admitting: Internal Medicine

## 2021-10-22 NOTE — Telephone Encounter (Signed)
Pt called in requesting to speak to [place] regarding the possibility of flu. Pt states her granddaughter had the flu first and now she is experiencing symptoms of cough, congestion, runny nose. Pt was offered to make an appt, pt declined and would rather speak to [place] first.

## 2021-10-22 NOTE — Telephone Encounter (Signed)
Pt wanted to leave her husband number to call [phone]

## 2021-10-23 ENCOUNTER — Encounter: Payer: Self-pay | Admitting: Family Medicine

## 2021-10-23 ENCOUNTER — Ambulatory Visit (INDEPENDENT_AMBULATORY_CARE_PROVIDER_SITE_OTHER): Payer: Medicare Other | Admitting: Family Medicine

## 2021-10-23 VITALS — Ht 63.0 in | Wt 217.4 lb

## 2021-10-23 DIAGNOSIS — J111 Influenza due to unidentified influenza virus with other respiratory manifestations: Secondary | ICD-10-CM

## 2021-10-23 MED ORDER — BENZONATATE 200 MG PO CAPS: 200.0000 mg | ORAL_CAPSULE | Freq: Three times a day (TID) | ORAL | 0 refills | Status: DC | PRN

## 2021-10-23 MED ORDER — GUAIFENESIN-CODEINE 100-10 MG/5ML PO SOLN: 5.0000 mL | Freq: Four times a day (QID) | ORAL | 0 refills | Status: DC | PRN

## 2021-10-23 NOTE — Progress Notes (Signed)
Deshonna Trnka T. Yanique Mulvihill, MD Primary Care and Sports Medicine [facility] at [facility] [place], [address] Phone: [phone]   FAX: [phone]  Heather Sweeney - 56 y.o. female   MRN [id number]   Date of Birth: 02/05/1966  Visit Date: 10/23/2021   PCP: McLean-Scocuzza, Nino Glow, MD   Referred by: Orland Mustard *  Virtual Visit via Telephone Note:  I connected with  Heather Sweeney on 10/23/2021  2:40 PM EST by telephone and verified that I am speaking with the correct person using two identifiers.   Location patient: home phone or cell phone Location provider: work or home office Consent: Verbal consent directly obtained from Heather Sweeney and that there may be a patient responsible charge related to this service. Persons participating in the virtual visit: patient, provider  I discussed the limitations of evaluation and management by telemedicine and the availability of in person appointments.  The patient expressed understanding and agreed to proceed.     Chief Complaint  Patient presents with   Cough   Headache    Grand baby tested positive for flu yesterday   Nasal Congestion     History of Present Illness:  Started to feel bad on Saturday.  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  He does have influenza confirmed positive from his pediatrician office.  She did have a home COVID test which she took and this is normal.  She has a pretty significant productive cough, she also has a headache with nasal congestion.  She also does have a sore throat. She denies fever, she does have a decreased p.o. of solids, she has been drinking more liquids. She does have a no GI symptoms. She has some aches and pains, but not diffuse polyarthralgia.  Review of Systems: pertinent positives and pertinent negatives as per HPI No acute distress verbally   Observations/Objective/Exam:  An attempt was  made to discern vital signs over the phone and per patient if applicable and possible.   Neurological:     Mental Status: pleasant and appropriate   Psychiatric:        Thought Content: Thought content normal.      Assessment and Plan:    ICD-10-CM   1. Influenza with respiratory manifestation  J11.1 benzonatate (TESSALON) 200 MG capsule    guaiFENesin-codeine 100-10 MG/5ML syrup     Total encounter time: 14 minutes. This includes total time spent on the day of encounter.   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  She is beyond the 48-hour window for Tamiflu.  We talked about all of this on the telephone, and recommended conservative care.  She is not having much success with Mucinex DM, so I am going to give the patient some Tessalon as well as some Cheratussin at night.  I discussed the assessment and treatment plan with the patient. The patient was provided an opportunity to ask questions and all were answered. The patient agreed with the plan and demonstrated an understanding of the instructions.   The patient was advised to call back or seek an in-person evaluation if the symptoms worsen or if the condition fails to improve as anticipated.  Follow-up: prn unless noted otherwise below No follow-ups on file.  Meds ordered this encounter  Medications   benzonatate (TESSALON) 200 MG capsule    Sig: Take 1 capsule (200 mg total) by mouth 3 (three) times daily as needed for cough.    Dispense:  40 capsule    Refill:  0   guaiFENesin-codeine 100-10 MG/5ML syrup    Sig: Take 5 mLs by mouth every 6 (six) hours as needed for cough. At night only if taking cough pills / tessalon)    Dispense:  120 mL    Refill:  0   No orders of the defined types were placed in this encounter.   Signed,  Maud Deed. Niaomi Cartaya, MD

## 2021-10-23 NOTE — Telephone Encounter (Signed)
Patient scheduled for a virtual visit

## 2021-10-25 NOTE — Telephone Encounter (Signed)
Patient was seen 10/23/21

## 2021-11-01 ENCOUNTER — Ambulatory Visit: Admit: 2021-11-01 | Discharge: 2021-11-02 | Payer: MEDICARE

## 2021-11-01 DIAGNOSIS — Z880 Allergy status to penicillin: Principal | ICD-10-CM

## 2021-11-01 DIAGNOSIS — L501 Idiopathic urticaria: Principal | ICD-10-CM

## 2021-11-01 DIAGNOSIS — T783XXD Angioneurotic edema, subsequent encounter: Principal | ICD-10-CM

## 2021-11-01 DIAGNOSIS — L509 Urticaria, unspecified: Secondary | ICD-10-CM | POA: Diagnosis not present

## 2021-11-04 DIAGNOSIS — L508 Other urticaria: Principal | ICD-10-CM

## 2021-11-04 MED ORDER — OMALIZUMAB 150 MG/ML SUBCUTANEOUS SYRINGE
SUBCUTANEOUS | 12 refills | 21.00000 days | Status: CP
Start: 2021-11-04 — End: ?

## 2021-11-11 DIAGNOSIS — M797 Fibromyalgia: Secondary | ICD-10-CM | POA: Diagnosis not present

## 2021-11-11 DIAGNOSIS — M25569 Pain in unspecified knee: Secondary | ICD-10-CM | POA: Diagnosis not present

## 2021-11-11 DIAGNOSIS — G894 Chronic pain syndrome: Secondary | ICD-10-CM | POA: Diagnosis not present

## 2021-11-11 DIAGNOSIS — Z79891 Long term (current) use of opiate analgesic: Secondary | ICD-10-CM | POA: Diagnosis not present

## 2021-11-11 DIAGNOSIS — M5451 Vertebrogenic low back pain: Secondary | ICD-10-CM | POA: Diagnosis not present

## 2021-11-11 DIAGNOSIS — M47816 Spondylosis without myelopathy or radiculopathy, lumbar region: Secondary | ICD-10-CM | POA: Diagnosis not present

## 2021-11-11 DIAGNOSIS — Z5181 Encounter for therapeutic drug level monitoring: Secondary | ICD-10-CM | POA: Diagnosis not present

## 2021-11-19 DIAGNOSIS — M25561 Pain in right knee: Secondary | ICD-10-CM | POA: Diagnosis not present

## 2021-11-20 ENCOUNTER — Telehealth: Payer: Medicare Other

## 2021-11-20 ENCOUNTER — Telehealth: Payer: Self-pay | Admitting: Pharmacist

## 2021-11-20 NOTE — Telephone Encounter (Signed)
°  Chronic Care Management   Note  11/20/2021 Name: Heather Sweeney MRN: [id number] DOB: Jun 03, 1966   Attempted to contact patient for scheduled appointment for medication management support. Left HIPAA compliant message for patient to return my call at their convenience.    Plan: - If I do not hear back from the patient by end of business today, will collaborate with Care Guide to outreach to schedule follow up with me   Catie [name], PharmD, [place], [place] Pharmacist [employer] at [employer] [phone]

## 2021-11-22 NOTE — Telephone Encounter (Signed)
Pt returning call. Pt requesting callback.  °

## 2021-11-23 ENCOUNTER — Other Ambulatory Visit: Payer: Self-pay | Admitting: Family

## 2021-11-23 ENCOUNTER — Other Ambulatory Visit: Payer: Self-pay | Admitting: Internal Medicine

## 2021-11-23 DIAGNOSIS — L509 Urticaria, unspecified: Principal | ICD-10-CM

## 2021-11-23 DIAGNOSIS — I10 Essential (primary) hypertension: Secondary | ICD-10-CM

## 2021-11-23 DIAGNOSIS — Z87898 Personal history of other specified conditions: Secondary | ICD-10-CM

## 2021-11-25 ENCOUNTER — Other Ambulatory Visit: Payer: Self-pay | Admitting: Internal Medicine

## 2021-11-25 DIAGNOSIS — I10 Essential (primary) hypertension: Secondary | ICD-10-CM

## 2021-11-25 DIAGNOSIS — Z6841 Body Mass Index (BMI) 40.0 and over, adult: Secondary | ICD-10-CM

## 2021-11-25 DIAGNOSIS — Z87898 Personal history of other specified conditions: Secondary | ICD-10-CM

## 2021-11-25 MED ORDER — CETIRIZINE 10 MG TABLET
ORAL_TABLET | 0 refills | 0.00000 days | Status: CP
Start: 2021-11-25 — End: ?

## 2021-11-26 ENCOUNTER — Other Ambulatory Visit: Payer: Self-pay | Admitting: Family

## 2021-12-03 ENCOUNTER — Ambulatory Visit: Payer: Self-pay | Admitting: *Deleted

## 2021-12-03 DIAGNOSIS — M1711 Unilateral primary osteoarthritis, right knee: Secondary | ICD-10-CM

## 2021-12-03 DIAGNOSIS — I10 Essential (primary) hypertension: Secondary | ICD-10-CM

## 2021-12-03 NOTE — Chronic Care Management (AMB) (Signed)
°  Care Management   Follow Up Note   12/03/2021 Name: Heather Sweeney MRN: [id number] DOB: [birth year]   Referred by: McLean-Scocuzza, Nino Glow, MD Reason for referral : Case Closure   Third unsuccessful telephone outreach was attempted today. The patient was referred to the case management team for assistance with care management and care coordination. The patient's primary care provider has been notified of our unsuccessful attempts to make or maintain contact with the patient. The care management team is pleased to engage with this patient at any time in the future should he/she be interested in assistance from the care management team.   Follow Up Plan: We have been unable to make contact with the patient for follow up. The care management team is available to follow up with the patient after provider conversation with the patient regarding recommendation for care management engagement and subsequent re-referral to the care management team.   Hubert Azure RN, MSN RN Care Management Coordinator [place] [phone] [email or web address]

## 2021-12-05 ENCOUNTER — Telehealth: Payer: Self-pay | Admitting: *Deleted

## 2021-12-05 NOTE — Telephone Encounter (Signed)
Please place future orders for lab appt.  

## 2021-12-06 NOTE — Addendum Note (Signed)
Addended by: Orland Mustard on: 12/06/2021 05:38 PM   Modules accepted: Orders

## 2021-12-10 ENCOUNTER — Ambulatory Visit: Payer: Self-pay | Admitting: Pharmacist

## 2021-12-10 NOTE — Chronic Care Management (AMB) (Signed)
?  Chronic Care Management  ? ?Note ? ?12/10/2021 ?Name: Heather Sweeney MRN: [id number] DOB: 06-20-1966 ? ? ? ?Closing pharmacy CCM case at this time. Will collaborate with Care Guide to outreach to schedule follow up with RN CM. Patient has clinic contact information for future questions or concerns.  ? ?Catie [name], PharmD, [place], CPP ?Clinical Pharmacist ?Therapist, music at [employer] ?[phone] ? ?

## 2021-12-11 ENCOUNTER — Ambulatory Visit: Admit: 2021-12-11 | Discharge: 2021-12-12 | Payer: MEDICARE

## 2021-12-11 DIAGNOSIS — L501 Idiopathic urticaria: Principal | ICD-10-CM

## 2021-12-12 ENCOUNTER — Other Ambulatory Visit (INDEPENDENT_AMBULATORY_CARE_PROVIDER_SITE_OTHER): Payer: Medicare Other

## 2021-12-12 ENCOUNTER — Other Ambulatory Visit: Payer: Self-pay

## 2021-12-12 DIAGNOSIS — I10 Essential (primary) hypertension: Secondary | ICD-10-CM | POA: Diagnosis not present

## 2021-12-12 DIAGNOSIS — L501 Idiopathic urticaria: Secondary | ICD-10-CM | POA: Insufficient documentation

## 2021-12-12 DIAGNOSIS — D649 Anemia, unspecified: Secondary | ICD-10-CM | POA: Diagnosis not present

## 2021-12-12 LAB — LIPID PANEL
Cholesterol: 160 mg/dL (ref 0–200)
HDL: 51.5 mg/dL (ref >39.00)
LDL Cholesterol: 86 mg/dL (ref 0–99)
NonHDL: 108.02
Total CHOL/HDL Ratio: 3
Triglycerides: 111 mg/dL (ref 0.0–149.0)
VLDL: 22.2 mg/dL (ref 0.0–40.0)

## 2021-12-12 LAB — COMPREHENSIVE METABOLIC PANEL
ALT: 16 U/L (ref 0–35)
AST: 15 U/L (ref 0–37)
Albumin: 3.8 g/dL (ref 3.5–5.2)
Alkaline Phosphatase: 83 U/L (ref 39–117)
BUN: 7 mg/dL (ref 6–23)
CO2: 29 mEq/L (ref 19–32)
Calcium: 9.1 mg/dL (ref 8.4–10.5)
Chloride: 103 mEq/L (ref 96–112)
Creatinine, Ser: 1.18 mg/dL (ref 0.40–1.20)
GFR: 52.73 mL/min — ABNORMAL LOW (ref >60.00)
Glucose, Bld: 70 mg/dL (ref 70–99)
Potassium: 3.4 mEq/L — ABNORMAL LOW (ref 3.5–5.1)
Sodium: 141 mEq/L (ref 135–145)
Total Bilirubin: 0.4 mg/dL (ref 0.2–1.2)
Total Protein: 6.5 g/dL (ref 6.0–8.3)

## 2021-12-12 LAB — CBC WITH DIFFERENTIAL/PLATELET
Basophils Absolute: 0 10*3/uL (ref 0.0–0.1)
Basophils Relative: 0.5 % (ref 0.0–3.0)
Eosinophils Absolute: 0.1 10*3/uL (ref 0.0–0.7)
Eosinophils Relative: 2 % (ref 0.0–5.0)
HCT: 32.3 % — ABNORMAL LOW (ref 36.0–46.0)
Hemoglobin: 10.8 g/dL — ABNORMAL LOW (ref 12.0–15.0)
Lymphocytes Relative: 35.7 % (ref 12.0–46.0)
Lymphs Abs: 2.2 10*3/uL (ref 0.7–4.0)
MCHC: 33.4 g/dL (ref 30.0–36.0)
MCV: 80 fl (ref 78.0–100.0)
Monocytes Absolute: 0.5 10*3/uL (ref 0.1–1.0)
Monocytes Relative: 8.7 % (ref 3.0–12.0)
Neutro Abs: 3.3 10*3/uL (ref 1.4–7.7)
Neutrophils Relative %: 53.1 % (ref 43.0–77.0)
Platelets: 191 10*3/uL (ref 150.0–400.0)
RBC: 4.04 Mil/uL (ref 3.87–5.11)
RDW: 14.6 % (ref 11.5–15.5)
WBC: 6.2 10*3/uL (ref 4.0–10.5)

## 2021-12-12 LAB — IBC + FERRITIN
Ferritin: 431.1 ng/mL — ABNORMAL HIGH (ref 10.0–291.0)
Iron: 76 ug/dL (ref 42–145)
Saturation Ratios: 26.7 % (ref 20.0–50.0)
TIBC: 284.2 ug/dL (ref 250.0–450.0)
Transferrin: 203 mg/dL — ABNORMAL LOW (ref 212.0–360.0)

## 2021-12-13 ENCOUNTER — Telehealth: Payer: Self-pay | Admitting: Internal Medicine

## 2021-12-13 ENCOUNTER — Other Ambulatory Visit: Payer: Self-pay

## 2021-12-13 ENCOUNTER — Other Ambulatory Visit (HOSPITAL_COMMUNITY)
Admission: RE | Admit: 2021-12-13 | Discharge: 2021-12-13 | Disposition: A | Discharge status: Home / Self Care | Payer: Medicare Other | Source: Ambulatory Visit | Attending: Internal Medicine | Admitting: Internal Medicine

## 2021-12-13 ENCOUNTER — Ambulatory Visit (INDEPENDENT_AMBULATORY_CARE_PROVIDER_SITE_OTHER): Payer: Medicare Other | Admitting: Internal Medicine

## 2021-12-13 ENCOUNTER — Encounter: Payer: Self-pay | Admitting: Internal Medicine

## 2021-12-13 VITALS — BP 108/68 | HR 101 | Temp 98.5°F | Ht 63.0 in | Wt 207.6 lb

## 2021-12-13 DIAGNOSIS — E611 Iron deficiency: Secondary | ICD-10-CM

## 2021-12-13 DIAGNOSIS — M25461 Effusion, right knee: Secondary | ICD-10-CM

## 2021-12-13 DIAGNOSIS — Z1231 Encounter for screening mammogram for malignant neoplasm of breast: Secondary | ICD-10-CM

## 2021-12-13 DIAGNOSIS — M1710 Unilateral primary osteoarthritis, unspecified knee: Secondary | ICD-10-CM

## 2021-12-13 DIAGNOSIS — N760 Acute vaginitis: Secondary | ICD-10-CM | POA: Diagnosis present

## 2021-12-13 DIAGNOSIS — R936 Abnormal findings on diagnostic imaging of limbs: Secondary | ICD-10-CM

## 2021-12-13 DIAGNOSIS — E876 Hypokalemia: Secondary | ICD-10-CM

## 2021-12-13 DIAGNOSIS — D509 Iron deficiency anemia, unspecified: Secondary | ICD-10-CM | POA: Diagnosis not present

## 2021-12-13 DIAGNOSIS — B001 Herpesviral vesicular dermatitis: Secondary | ICD-10-CM

## 2021-12-13 HISTORY — DX: Iron deficiency anemia, unspecified: D50.9

## 2021-12-13 HISTORY — DX: Hypokalemia: E87.6

## 2021-12-13 MED ORDER — VALACYCLOVIR HCL 1 G PO TABS: 1000.0000 mg | ORAL_TABLET | Freq: Two times a day (BID) | ORAL | 0 refills | Status: DC

## 2021-12-13 NOTE — Telephone Encounter (Signed)
Lm on vm that handicapp renewal form is up front/Did patient already have a copy of form?  ?

## 2021-12-13 NOTE — Progress Notes (Signed)
Chief Complaint  Patient presents with   Follow-up   Diabetes   Depression   F/u  1. Htn controlled on toprol xl 25 mg q and norvasc 5 mg qd reviewed labs hld controlled hypoK given high K foot list  2. Iron def anemia rec mvt with iron daily otc  3. Right knee pain and swelling with ROM issues trouble with walking using cane refer back to ortho Dr. Lyla Glassing   Review of Systems  Constitutional:  Negative for weight loss.  HENT:  Negative for hearing loss.   Eyes:  Negative for blurred vision.  Respiratory:  Negative for shortness of breath.   Cardiovascular:  Negative for chest pain.  Gastrointestinal:  Negative for abdominal pain and blood in stool.  Genitourinary:  Negative for dysuria.  Musculoskeletal:  Positive for joint pain. Negative for falls.  Skin:  Negative for rash.  Neurological:  Negative for headaches.  Psychiatric/Behavioral:  Negative for depression.   Past Medical History:  Diagnosis Date   Allergic rhinitis    Asthma    Bipolar disorder ([place])    Chronic low back pain    CTS (carpal tunnel syndrome)    hands   Depression    Gallstones    GERD (gastroesophageal reflux disease)    History of prediabetes    Hypertension    Laceration of right foot    Onychomycosis    OSA on CPAP    Overactive bladder    PVC's (premature ventricular contractions)    PVC's (premature ventricular contractions)    Dr. Raliegh Ip cards   Seizures [facility])    8th grade   Past Surgical History:  Procedure Laterality Date   ABDOMINAL HYSTERECTOMY  07/08/2011   ABDOMINAL HYSTERECTOMY     supracervical in 07/2011    [place]     right hand [place]     2012   CHOLECYSTECTOMY N/A 01/12/2018   Procedure: LAPAROSCOPIC CHOLECYSTECTOMY WITH INTRAOPERATIVE CHOLANGIOGRAM ERAS PATHWAY;  Surgeon: Johnathan Hausen, MD;  Location: [facility] ORS;  Service: General;  Laterality: N/A;   COLONOSCOPY WITH PROPOFOL N/A 04/15/2019   Procedure: COLONOSCOPY WITH PROPOFOL;  Surgeon:  Jonathon Bellows, MD;  Location: [facility] ENDOSCOPY;  Service: Gastroenterology;  Laterality: N/A;   DILATION AND CURETTAGE OF UTERUS     04/2011 benign endometrial polyp squamous metaplasia, inflamm, blood, mucous, benign    ESOPHAGOGASTRODUODENOSCOPY (EGD) WITH PROPOFOL N/A 10/23/2020   Procedure: ESOPHAGOGASTRODUODENOSCOPY (EGD) WITH PROPOFOL;  Surgeon: Jonathon Bellows, MD;  Location: [facility] ENDOSCOPY;  Service: Gastroenterology;  Laterality: N/A;   FLEXIBLE SIGMOIDOSCOPY N/A 02/15/2021   Procedure: FLEXIBLE SIGMOIDOSCOPY;  Surgeon: Jonathon Bellows, MD;  Location[facility] ENDOSCOPY;  Service: Gastroenterology;  Laterality: N/A;   FOOT SURGERY     Dr. Prudence Davidson    LEEP     2006   TUBAL LIGATION     Family History  Problem Relation Age of Onset   Cancer Father        esophageal    Hypertension Father    Heart failure Mother    Hypertension Mother    Early death Mother    Heart disease Mother        chf   Breast cancer Sister 21   Cancer Sister        breast   Depression Sister    Mental illness Sister    Asthma Son    Cancer Other        brain cancer   Social History   Socioeconomic  History   Marital status: Married    Spouse name: Not on file   Number of children: Not on file   Years of education: Not on file   Highest education level: Not on file  Occupational History   Not on file  Tobacco Use   Smoking status: Never   Smokeless tobacco: Current    Types: Snuff   Tobacco comments:    Is in process of decreasing amount  Vaping Use   Vaping Use: Never used  Substance and Sexual Activity   Alcohol use: No    Alcohol/week: 0.0 standard drinks   Drug use: No   Sexual activity: Not Currently    Birth control/protection: Surgical    Comment: Hysterectomy  Other Topics Concern   Not on file  Social History Narrative   Married    No guns    Wears seat belt    Safe in relationship    Never smoker    2 sons Gaspar Bidding ~10 and another son ~28 as of 10/2019   1 granson eli 1 y.o as of 10/2019    2 sisters    Unemployed       Social Determinants of Health   Financial Resource Strain: Low Risk    Difficulty of Paying Living Expenses: Not hard at all  Food Insecurity: No Food Insecurity   Worried About Charity fundraiser in the Last Year: Never true   Arboriculturist in the Last Year: Never true  Transportation Needs: No Transportation Needs   Lack of Transportation (Medical): No   Lack of Transportation (Non-Medical): No  Physical Activity: Not on file  Stress: Not on file  Social Connections: Not on file  Intimate Partner Violence: Not At Risk   Fear of Current or Ex-Partner: No   Emotionally Abused: No   Physically Abused: No   Sexually Abused: No   Current Meds  Medication Sig   ACCU-CHEK GUIDE test strip Use as instructed   Accu-Chek Softclix Lancets lancets Use as instructed   Alcohol Swabs (ALCOHOL PADS) 70 % PADS Use as directed,once daily to test blood sugar   amLODipine (NORVASC) 5 MG tablet Take 1 tablet (5 mg total) by mouth daily.   benzonatate (TESSALON) 200 MG capsule Take 1 capsule (200 mg total) by mouth 3 (three) times daily as needed for cough.   benztropine (COGENTIN) 1 MG tablet Take 1 mg by mouth 2 (two) times daily.   Biotin w/ Vitamins C & E (HAIR/SKIN/NAILS PO) Take 2,500 mcg by mouth daily.   Blood Glucose Calibration (TRUE METRIX LEVEL 1) Low SOLN    Blood Glucose Monitoring Suppl (ACCU-CHEK GUIDE ME) w/Device KIT Use as directed,once daily to test blood sugar   buPROPion (WELLBUTRIN XL) 150 MG 24 hr tablet Take 150 mg by mouth every morning.   cetirizine (ZYRTEC ALLERGY) 10 MG tablet Take 2 tablets (20 mg total) by mouth daily.   clobetasol cream (TEMOVATE) 4.01 % Apply 1 application topically 2 (two) times daily.   clotrimazole (CLOTRIMAZOLE AF) 1 % cream Apply 1 application topically 2 (two) times daily.   Dextromethorphan-Guaifenesin 60-1200 MG 12hr tablet Take 1 tablet by mouth every 12 (twelve) hours. Prn cough green label    diphenhydrAMINE (BENADRYL) 25 MG tablet Take 25 mg by mouth every 6 (six) hours as needed.   EASY COMFORT PEN NEEDLES 31G X 8 MM MISC INJECT VICTOZA ONCE DAILY AS DIRECTED   EPINEPHrine 0.3 mg/0.3 mL IJ SOAJ injection Inject 0.3  mg into the muscle as needed for anaphylaxis.   famotidine (PEPCID) 40 MG tablet Take 1 tablet (40 mg total) by mouth daily.   fexofenadine (ALLEGRA) 180 MG tablet Take 2 tablets by mouth every morning.   FIBER ADULT GUMMIES PO Take by mouth.   FLUoxetine (PROZAC) 40 MG capsule Take 40 mg by mouth daily.   fluPHENAZine (PROLIXIN) 2.5 MG tablet Take 2.5 mg by mouth 2 (two) times daily.   fluticasone (FLONASE) 50 MCG/ACT nasal spray Place 2 sprays into both nostrils daily.   furosemide (LASIX) 20 MG tablet Take 0.5-1 tablets (10-20 mg total) by mouth daily as needed.   guaiFENesin-codeine 100-10 MG/5ML syrup Take 5 mLs by mouth every 6 (six) hours as needed for cough. At night only if taking cough pills / tessalon)   hydrocortisone 2.5 % ointment Apply topically 2 (two) times daily. Left lower abdomen/groin   hydroxychloroquine (PLAQUENIL) 200 MG tablet Take 200 mg by mouth 2 (two) times daily.   hydrOXYzine (ATARAX/VISTARIL) 25 MG tablet 1 in the morning, 1 in the afternoon, and 2 in the evening   Lancet Devices (EASY MINI EJECT LANCING DEVICE) MISC Use as directed,once daily to test blood sugar   Melatonin 10 MG TABS Take by mouth at bedtime.   meloxicam (MOBIC) 15 MG tablet Take 15 mg by mouth daily.   metoprolol succinate (TOPROL-XL) 25 MG 24 hr tablet TAKE 1 TABLET BY MOUTH ONCE DAILY WITH OR IMMEDIATELY FOLLOWING A MEAL   montelukast (SINGULAIR) 10 MG tablet Take 1 tablet (10 mg total) by mouth daily.   Multiple Vitamins-Minerals (ALIVE MULTI-VITAMIN PO) Take by mouth daily in the afternoon.   mupirocin ointment (BACTROBAN) 2 % Apply 1 application topically 2 (two) times daily. Prn right knee wound   omega-3 acid ethyl esters (LOVAZA) 1 g capsule Take 2 capsules by  mouth 2 (two) times daily.   omeprazole (PRILOSEC) 40 MG capsule Take 1 capsule (40 mg total) by mouth daily.   oxyCODONE-acetaminophen (PERCOCET) 10-325 MG tablet Take 1 tablet by mouth 3 (three) times daily as needed.   OZEMPIC, 1 MG/DOSE, 4 MG/3ML SOPN Inject 1 mg as directed once a week. Same day. D/c victoza   Probiotic Product (PROBIOTIC DAILY PO) Take by mouth. probiogen probiotic daily   QUEtiapine (SEROQUEL XR) 400 MG 24 hr tablet Take 400 mg by mouth daily.   topiramate (TOPAMAX) 200 MG tablet Take 200 mg by mouth daily.   valACYclovir (VALTREX) 1000 MG tablet Take 1 tablet (1,000 mg total) by mouth 2 (two) times daily. X 3-7 days   vitamin B-12 (CYANOCOBALAMIN) 1000 MCG tablet Take 1,000 mcg by mouth daily.   zinc oxide 20 % ointment Apply 1 application topically 2 (two) times daily as needed for irritation.   Allergies  Allergen Reactions   Cephalexin Swelling and Rash   Hctz [Hydrochlorothiazide] Rash and Swelling    Mild she says   Lisinopril Hives and Other (See Comments)    Headache, Low BP, angioedema     Losartan Hives and Swelling    Angioedema   Penicillins Rash    Has patient had a PCN reaction causing immediate rash, facial/tongue/throat swelling, SOB or lightheadedness with hypotension: Yes Has patient had a PCN reaction causing severe rash involving mucus membranes or skin necrosis: No Has patient had a PCN reaction that required hospitalization: No Has patient had a PCN reaction occurring within the last 10 years: No If all of the above answers are "NO", then may proceed with  Cephalosporin use.    Bee Venom     Swelling    Metformin And Related     Rash    Recent Results (from the past 2160 hour(s))  IBC + Ferritin     Status: Abnormal   Collection Time: 12/12/21  9:04 AM  Result Value Ref Range   Iron 76 42 - 145 ug/dL   Transferrin 203.0 (L) 212.0 - 360.0 mg/dL   Saturation Ratios 26.7 20.0 - 50.0 %   Ferritin 431.1 (H) 10.0 - 291.0 ng/mL   TIBC  284.2 250.0 - 450.0 mcg/dL  CBC with Differential/Platelet     Status: Abnormal   Collection Time: 12/12/21  9:04 AM  Result Value Ref Range   WBC 6.2 4.0 - 10.5 K/uL   RBC 4.04 3.87 - 5.11 Mil/uL   Hemoglobin 10.8 (L) 12.0 - 15.0 g/dL   HCT 32.3 (L) 36.0 - 46.0 %   MCV 80.0 78.0 - 100.0 fl   MCHC 33.4 30.0 - 36.0 g/dL   RDW 14.6 11.5 - 15.5 %   Platelets 191.0 150.0 - 400.0 K/uL   Neutrophils Relative % 53.1 43.0 - 77.0 %   Lymphocytes Relative 35.7 12.0 - 46.0 %   Monocytes Relative 8.7 3.0 - 12.0 %   Eosinophils Relative 2.0 0.0 - 5.0 %   Basophils Relative 0.5 0.0 - 3.0 %   Neutro Abs 3.3 1.4 - 7.7 K/uL   Lymphs Abs 2.2 0.7 - 4.0 K/uL   Monocytes Absolute 0.5 0.1 - 1.0 K/uL   Eosinophils Absolute 0.1 0.0 - 0.7 K/uL   Basophils Absolute 0.0 0.0 - 0.1 K/uL  Comprehensive metabolic panel     Status: Abnormal   Collection Time: 12/12/21  9:04 AM  Result Value Ref Range   Sodium 141 135 - 145 mEq/L   Potassium 3.4 (L) 3.5 - 5.1 mEq/L   Chloride 103 96 - 112 mEq/L   CO2 29 19 - 32 mEq/L   Glucose, Bld 70 70 - 99 mg/dL   BUN 7 6 - 23 mg/dL   Creatinine, Ser 1.18 0.40 - 1.20 mg/dL   Total Bilirubin 0.4 0.2 - 1.2 mg/dL   Alkaline Phosphatase 83 39 - 117 U/L   AST 15 0 - 37 U/L   ALT 16 0 - 35 U/L   Total Protein 6.5 6.0 - 8.3 g/dL   Albumin 3.8 3.5 - 5.2 g/dL   GFR 52.73 (L) >60.00 mL/min    Comment: Calculated using the CKD-EPI Creatinine Equation (2021)   Calcium 9.1 8.4 - 10.5 mg/dL  Lipid panel     Status: None   Collection Time: 12/12/21  9:04 AM  Result Value Ref Range   Cholesterol 160 0 - 200 mg/dL    Comment: ATP III Classification       Desirable:  < 200 mg/dL               Borderline High:  200 - 239 mg/dL          High:  > = 240 mg/dL   Triglycerides 111.0 0.0 - 149.0 mg/dL    Comment: Normal:  <150 mg/dLBorderline High:  150 - 199 mg/dL   HDL 51.50 >39.00 mg/dL   VLDL 22.2 0.0 - 40.0 mg/dL   LDL Cholesterol 86 0 - 99 mg/dL   Total CHOL/HDL Ratio 3      Comment:                Men  Women1/2 Average Risk     3.4          3.3Average Risk          5.0          4.42X Average Risk          9.6          7.13X Average Risk          15.0          11.0                       NonHDL 108.02     Comment: NOTE:  Non-HDL goal should be 30 mg/dL higher than patient's LDL goal (i.e. LDL goal of < 70 mg/dL, would have non-HDL goal of < 100 mg/dL)   Objective  Body mass index is 36.77 kg/m. Wt Readings from Last 3 Encounters:  12/13/21 207 lb 9.6 oz (94.2 kg)  10/23/21 217 lb 6 oz (98.6 kg)  09/10/21 218 lb (98.9 kg)   Temp Readings from Last 3 Encounters:  12/13/21 98.5 F (36.9 C)  09/10/21 (!) 96.9 F (36.1 C) (Temporal)  08/28/21 98 F (36.7 C) (Oral)   BP Readings from Last 3 Encounters:  12/13/21 108/68  09/10/21 124/76  08/28/21 138/84   Pulse Readings from Last 3 Encounters:  12/13/21 (!) 101  08/28/21 88  08/01/21 (!) 101    Physical Exam Vitals and nursing note reviewed.  Constitutional:      Appearance: Normal appearance. She is well-developed and well-groomed.  HENT:     Head: Normocephalic and atraumatic.  Eyes:     Conjunctiva/sclera: Conjunctivae normal.     Pupils: Pupils are equal, round, and reactive to light.  Cardiovascular:     Rate and Rhythm: Normal rate and regular rhythm.     Heart sounds: Normal heart sounds. No murmur heard. Pulmonary:     Effort: Pulmonary effort is normal.     Breath sounds: Normal breath sounds.  Abdominal:     General: Abdomen is flat. Bowel sounds are normal.     Tenderness: There is no abdominal tenderness.  Musculoskeletal:        General: No tenderness.  Skin:    General: Skin is warm and dry.  Neurological:     General: No focal deficit present.     Mental Status: She is alert and oriented to person, place, and time. Mental status is at baseline.     Cranial Nerves: Cranial nerves 2-12 are intact.     Motor: Motor function is intact.     Coordination: Coordination is  intact.     Gait: Gait is intact.     Comments: Using cane today  Psychiatric:        Attention and Perception: Attention and perception normal.        Mood and Affect: Mood and affect normal.        Speech: Speech normal.        Behavior: Behavior normal. Behavior is cooperative.        Thought Content: Thought content normal.        Cognition and Memory: Cognition and memory normal.        Judgment: Judgment normal.    Assessment  Plan  Abnormal MRI, knee - Plan: Ambulatory referral to Orthopedic Surgery Arthritis of knee - Plan: Ambulatory referral to Orthopedic Surgery  Effusion of right knee - Plan: Ambulatory referral to Orthopedic Surgery  Acute vaginitis -  Plan: Urine cytology ancillary only([place])   Fever blister - Plan: valACYclovir (VALTREX) 1000 MG tablet  Iron deficiency Iron deficiency anemia, unspecified iron deficiency anemia type    HM Flu shot utd Tdap rx per pt had 11/24/21 Pfizer 2/2 consider booster rec  Prevnar today given Consider shingrix future rx 11/24/21  Not immune MMR, hep B titer consider in the future  915/21 house calls visit A1c 6.1    h/o supracervical hysterectomy ([facility]), pap 02/29/16 negative no comment HPV, another pap 06/23/17 neg pap neg HPV -h/o LEEP 2006 11/29/19 negative pap   mammo 08/04/19 negative ordered 08/22/20 negative ordered 2022  08/05/21 negative ordered    Colonoscopy 04/15/19 normal f/u in 10 years egd sch 10/23/20 Dr. Vicente Males neg bxs 10/23/20 aperistalsis and food + consider gastric emptying study and esophageal mamometry  10/25/20 Dr. Bailey Mech  Dr Jacklynn Lewis,    Echo Dr. Nehemiah Massed -nl LV function EF 60% moderate mitral insuff and mild tricuspid insufficiency treadmill stress test neg 07/30/13  -h/o PVCS   Hep C neg 08/17/20 HIV neg 02/04/16 Rec healthy diet and exercise    Ob/gyn appt pending bladder leakage I.e bladder sling vs other [facility] in PT (stopped PT w/o help worsen sneezing or coughing)  -Dr. Georgianne Fick pt  wants surgery as of 04/20/20 sx's better and no surgery indicated    Consider referral repeat sleep study h/o OSA f/u Dr. Kathyrn Sheriff and will get procedure for OSA as of 04/17/21     Provider: Dr. Olivia Mackie McLean-Scocuzza-Internal Medicine

## 2021-12-13 NOTE — Patient Instructions (Addendum)
Please take multivitamin with iron daily  Miralax 1-2 caps x 3 days in 8 ounces of any liquid  Gas X, peptobismol, Mylanta Biotene mouthwash   Hypokalemia Hypokalemia means that the amount of potassium in the blood is lower than normal. Potassium is a chemical (electrolyte) that helps regulate the amount of fluid in the body. It also stimulates muscle tightening (contraction) and helps nerves work properly. Normally, most of the body's potassium is inside cells, and only a very small amount is in the blood. Because the amount in the blood is so small, minor changes to potassium levels in the blood can be life-threatening. What are the causes? This condition may be caused by: Antibiotic medicine. Diarrhea or vomiting. Taking too much of a medicine that helps you have a bowel movement (laxative) can cause diarrhea and lead to hypokalemia. Chronic kidney disease (CKD). Medicines that help the body get rid of excess fluid (diuretics). Eating disorders, such as bulimia. Low magnesium levels in the body. Sweating a lot. What are the signs or symptoms? Symptoms of this condition include: Weakness. Constipation. Fatigue. Muscle cramps. Mental confusion. Skipped heartbeats or irregular heartbeat (palpitations). Tingling or numbness. How is this diagnosed? This condition is diagnosed with a blood test. How is this treated? This condition may be treated by: Taking potassium supplements by mouth. Adjusting the medicines that you take. Eating more foods that contain a lot of potassium. If your potassium level is very low, you may need to get potassium through an IV and be monitored in the hospital. Follow these instructions at home:  Take over-the-counter and prescription medicines only as told by your health care provider. This includes vitamins and supplements. Eat a healthy diet. A healthy diet includes fresh fruits and vegetables, whole grains, healthy fats, and lean proteins. If  instructed, eat more foods that contain a lot of potassium. This includes: Nuts, such as peanuts and pistachios. Seeds, such as sunflower seeds and pumpkin seeds. Peas, lentils, and lima beans. Whole grain and bran cereals and breads. Fresh fruits and vegetables, such as apricots, avocado, bananas, cantaloupe, kiwi, oranges, tomatoes, asparagus, and potatoes. Orange juice. Tomato juice. Red meats. Yogurt. Keep all follow-up visits as told by your health care provider. This is important. Contact a health care provider if you: Have weakness that gets worse. Feel your heart pounding or racing. Vomit. Have diarrhea. Have diabetes (diabetes mellitus) and you have trouble keeping your blood sugar (glucose) in your target range. Get help right away if you: Have chest pain. Have shortness of breath. Have vomiting or diarrhea that lasts for more than 2 days. Faint. Summary Hypokalemia means that the amount of potassium in the blood is lower than normal. This condition is diagnosed with a blood test. Hypokalemia may be treated by taking potassium supplements, adjusting the medicines that you take, or eating more foods that are high in potassium. If your potassium level is very low, you may need to get potassium through an IV and be monitored in the hospital. This information is not intended to replace advice given to you by your health care provider. Make sure you discuss any questions you have with your health care provider. Document Revised: 05/04/2018 Document Reviewed: 05/05/2018 Elsevier Patient Education  2022 [place].  Iron Deficiency Anemia, Adult Iron deficiency anemia is when you do not have enough red blood cells or hemoglobin in your blood. This happens because you have too little iron in your body. Hemoglobin carries oxygen to parts of the body. Anemia  can cause your body to not get enough oxygen. What are the causes? Not eating enough foods that have iron in them. The  body not being able to take in iron well. Needing more iron due to pregnancy or heavy menstrual periods, for females. Cancer. Bleeding in the bowels. Many blood draws. What increases the risk? Being pregnant. Being a teenage girl going through a growth spurt. What are the signs or symptoms? Pale skin, lips, and nails. Weakness, dizziness, and getting tired easily. Headache. Feeling like you cannot breathe well when moving (shortness of breath). Cold hands and feet. Fast heartbeat or a heartbeat that is not regular. Feeling grouchy (irritable) or breathing fast. These are more common in very bad anemia. Mild anemia may not cause any symptoms. How is this treated? This condition is treated by finding out why you do not have enough iron and then getting more iron. It may include: Adding foods to your diet that have a lot of iron. Taking iron pills (supplements). If you are pregnant or breastfeeding, you may need to take extra iron. Your diet often does not provide the amount of iron that you need. Getting more vitamin C in your diet. Vitamin C helps your body take in iron. You may need to take iron pills with a glass of orange juice or vitamin C pills. Medicines to make heavy menstrual periods lighter. Surgery. You may need blood tests to see if treatment is working. If the treatment does not seem to be working, you may need more tests. Follow these instructions at home: Medicines Take over-the-counter and prescription medicines only as told by your doctor. This includes iron pills and vitamins. Take iron pills when your stomach is empty. If you cannot handle this, take them with food. Do not drink milk or take antacids at the same time as your iron pills. Iron pills may turn your poop (stool)black. If you cannot handle taking iron pills by mouth, ask your doctor about getting iron through: An IV tube. A shot (injection) into a muscle. Eating and drinking  Talk with your doctor  before changing the foods you eat. He or she may tell you to eat foods that have a lot of iron, such as: Liver. Low-fat (lean) beef. Breads and cereals that have iron added to them. Eggs. Dried fruit. Dark green, leafy vegetables. Eat fresh fruits and vegetables that are high in vitamin C. They help your body use iron. Foods with a lot of vitamin C include: Oranges. Peppers. Tomatoes. Mangoes. Drink enough fluid to keep your pee (urine) pale yellow. Managing constipation If you are taking iron pills, they may cause trouble pooping (constipation). To prevent or treat trouble pooping, you may need to: Take over-the-counter or prescription medicines. Eat foods that are high in fiber. These include beans, whole grains, and fresh fruits and vegetables. Limit foods that are high in fat and sugar. These include fried or sweet foods. General instructions Return to your normal activities as told by your doctor. Ask your doctor what activities are safe for you. Keep yourself clean, and keep things clean around you. Keep all follow-up visits as told by your doctor. This is important. Contact a doctor if: You feel like you may vomit (nauseous), or you vomit. You feel weak. You are sweating for no reason. You have trouble pooping, such as: Pooping less than 3 times a week. Straining to poop. Having poop that is hard, dry, or larger than normal. Feeling full or bloated. Pain in the  lower belly. Not feeling better after pooping. Get help right away if: You pass out (faint). You have chest pain. You have trouble breathing that: Is very bad. Gets worse with physical activity. You have a fast heartbeat, or a heartbeat that does not feel regular. You get light-headed when getting up from sitting or lying down. These symptoms may be an emergency. Do not wait to see if the symptoms will go away. Get medical help right away. Call your local emergency services (911 in the [place] Do not drive  yourself to the hospital. Summary Iron deficiency anemia is when you have too little iron in your body. This condition is treated by finding out why you do not have enough iron in your body and then getting more iron. Take over-the-counter and prescription medicines only as told by your doctor. Eat fresh fruits and vegetables that are high in vitamin C. Get help right away if you cannot breathe well. This information is not intended to replace advice given to you by your health care provider. Make sure you discuss any questions you have with your health care provider. Document Revised: 05/31/2019 Document Reviewed: 05/31/2019 Elsevier Patient Education  [place].

## 2021-12-15 ENCOUNTER — Other Ambulatory Visit: Payer: Self-pay | Admitting: Internal Medicine

## 2021-12-16 LAB — URINE CYTOLOGY ANCILLARY ONLY
Bacterial Vaginitis-Urine: NEGATIVE
Candida Urine: NEGATIVE
Comment: NEGATIVE
Trichomonas: NEGATIVE

## 2021-12-17 ENCOUNTER — Encounter: Payer: Self-pay | Admitting: Internal Medicine

## 2021-12-17 ENCOUNTER — Other Ambulatory Visit: Payer: Self-pay

## 2021-12-17 ENCOUNTER — Telehealth (INDEPENDENT_AMBULATORY_CARE_PROVIDER_SITE_OTHER): Payer: Medicare Other | Admitting: Internal Medicine

## 2021-12-17 ENCOUNTER — Telehealth: Payer: Self-pay | Admitting: Internal Medicine

## 2021-12-17 DIAGNOSIS — I10 Essential (primary) hypertension: Secondary | ICD-10-CM | POA: Diagnosis not present

## 2021-12-17 DIAGNOSIS — L304 Erythema intertrigo: Secondary | ICD-10-CM

## 2021-12-17 DIAGNOSIS — G8929 Other chronic pain: Secondary | ICD-10-CM

## 2021-12-17 DIAGNOSIS — Z6841 Body Mass Index (BMI) 40.0 and over, adult: Secondary | ICD-10-CM

## 2021-12-17 DIAGNOSIS — M545 Low back pain, unspecified: Secondary | ICD-10-CM | POA: Diagnosis not present

## 2021-12-17 DIAGNOSIS — M179 Osteoarthritis of knee, unspecified: Secondary | ICD-10-CM | POA: Insufficient documentation

## 2021-12-17 DIAGNOSIS — M5431 Sciatica, right side: Secondary | ICD-10-CM | POA: Diagnosis not present

## 2021-12-17 DIAGNOSIS — M170 Bilateral primary osteoarthritis of knee: Secondary | ICD-10-CM

## 2021-12-17 DIAGNOSIS — M1711 Unilateral primary osteoarthritis, right knee: Secondary | ICD-10-CM | POA: Diagnosis not present

## 2021-12-17 DIAGNOSIS — I890 Lymphedema, not elsewhere classified: Secondary | ICD-10-CM

## 2021-12-17 HISTORY — DX: Osteoarthritis of knee, unspecified: M17.9

## 2021-12-17 HISTORY — DX: Other chronic pain: G89.29

## 2021-12-17 HISTORY — DX: Low back pain, unspecified: M54.50

## 2021-12-17 NOTE — Telephone Encounter (Signed)
The max dose is meloxicam is 15 mg total I prefer she not take this daily only as needed ? ?

## 2021-12-17 NOTE — Patient Instructions (Signed)
Plastic surgery Dr. Mingo Amber  ?MD Physician  ? ?Primary Contact Information ? ?Phone Fax E-mail Address  ?[phone] Not available [address]  ? [place] [address]  ?   ?Specialties     ?Plastic Surgery     ?      ? ?My chart help call [phone] ?

## 2021-12-17 NOTE — Progress Notes (Signed)
Telephone Note  I connected with Heather Sweeney    on 12/17/21 at  1:40 PM EDT by telephone and verified that I am speaking with the correct person using two identifiers. Location patient: [place] Location provider:work or home office Persons participating in the virtual visit: patient, provider  I discussed the limitations and requested verbal permission for telemedicine visit. The patient expressed understanding and agreed to proceed.   HPI:  Acute telemedicine visit for : Obesity and recurrent intertrigo pannus tried otc creams will refer to plastic  B/l knee arthritis she saw ortho today for right knee and had steroid injection which helps [facility] with Rx compression stockings from ortho and wants 4 prong cane, scale, low back brace and BP monitor will fax to [facility] fax # [phone]  -Pertinent past medical history: see below -Pertinent medication allergies: Allergies  Allergen Reactions   Cephalexin Swelling and Rash   Hctz [Hydrochlorothiazide] Rash and Swelling    Mild she says   Lisinopril Hives and Other (See Comments)    Headache, Low BP, angioedema     Losartan Hives and Swelling    Angioedema   Penicillins Rash    Has patient had a PCN reaction causing immediate rash, facial/tongue/throat swelling, SOB or lightheadedness with hypotension: Yes Has patient had a PCN reaction causing severe rash involving mucus membranes or skin necrosis: No Has patient had a PCN reaction that required hospitalization: No Has patient had a PCN reaction occurring within the last 10 years: No If all of the above answers are "NO", then may proceed with Cephalosporin use.    Bee Venom     Swelling    Metformin And Related     Rash    -COVID-19 vaccine status:  Immunization History  Administered Date(s) Administered   Influenza,inj,Quad PF,6+ Mos 07/12/2016, 06/09/2019, 08/01/2021   Influenza-Unspecified 05/06/2020   PFIZER(Purple  Top)SARS-COV-2 Vaccination 01/17/2020, 02/07/2020   Pneumococcal Conjugate-13 09/10/2021   Pneumococcal Polysaccharide-23 07/12/2016   Tdap 04/17/2009     ROS: See pertinent positives and negatives per HPI.  Past Medical History:  Diagnosis Date   Allergic rhinitis    Arthritis of knee, right    Asthma    Bipolar disorder (HCC)    Chronic low back pain    CTS (carpal tunnel syndrome)    hands   Depression    Gallstones    GERD (gastroesophageal reflux disease)    History of prediabetes    Hypertension    Laceration of right foot    Onychomycosis    OSA on CPAP    Overactive bladder    PVC's (premature ventricular contractions)    PVC's (premature ventricular contractions)    Dr. Kirtland Bouchard cards   Seizures [facility])    8th grade    Past Surgical History:  Procedure Laterality Date   ABDOMINAL HYSTERECTOMY  07/08/2011   ABDOMINAL HYSTERECTOMY     supracervical in 07/2011    CARPAL TUNNEL RELEASE     right hand 1996   CESAREAN SECTION     2012   CHOLECYSTECTOMY N/A 01/12/2018   Procedure: LAPAROSCOPIC CHOLECYSTECTOMY WITH INTRAOPERATIVE CHOLANGIOGRAM ERAS PATHWAY;  Surgeon: Luretha Murphy, MD;  Location: [facility] ORS;  Service: General;  Laterality: N/A;   COLONOSCOPY WITH PROPOFOL N/A 04/15/2019   Procedure: COLONOSCOPY WITH PROPOFOL;  Surgeon: [name], MD;  Location: [facility] ENDOSCOPY;  Service: Gastroenterology;  Laterality: N/A;   DILATION AND CURETTAGE OF UTERUS     04/2011 benign endometrial  polyp squamous metaplasia, inflamm, blood, mucous, benign    ESOPHAGOGASTRODUODENOSCOPY (EGD) WITH PROPOFOL N/A 10/23/2020   Procedure: ESOPHAGOGASTRODUODENOSCOPY (EGD) WITH PROPOFOL;  Surgeon: [name], MD;  Location: [facility] ENDOSCOPY;  Service: Gastroenterology;  Laterality: N/A;   FLEXIBLE SIGMOIDOSCOPY N/A 02/15/2021   Procedure: FLEXIBLE SIGMOIDOSCOPY;  Surgeon: [name], MD;  Location: [facility] ENDOSCOPY;  Service: Gastroenterology;  Laterality: N/A;   FOOT SURGERY     Dr. Stacie Acres    LEEP      2006   TUBAL LIGATION       Current Outpatient Medications:    ACCU-CHEK GUIDE test strip, Use as instructed, Disp: 100 strip, Rfl: 0   Accu-Chek Softclix Lancets lancets, Use as instructed, Disp: 100 each, Rfl: 0   Alcohol Swabs (ALCOHOL PADS) 70 % PADS, Use as directed,once daily to test blood sugar, Disp: 100 each, Rfl: 3   amLODipine (NORVASC) 5 MG tablet, Take 1 tablet (5 mg total) by mouth daily., Disp: 90 tablet, Rfl: 3   benzonatate (TESSALON) 200 MG capsule, Take 1 capsule (200 mg total) by mouth 3 (three) times daily as needed for cough., Disp: 40 capsule, Rfl: 0   benztropine (COGENTIN) 1 MG tablet, Take 1 mg by mouth 2 (two) times daily., Disp: , Rfl:    Biotin w/ Vitamins C & E (HAIR/SKIN/NAILS PO), Take 2,500 mcg by mouth daily., Disp: , Rfl:    Blood Glucose Calibration (TRUE METRIX LEVEL 1) Low SOLN, , Disp: , Rfl:    Blood Glucose Monitoring Suppl (ACCU-CHEK GUIDE ME) w/Device KIT, Use as directed,once daily to test blood sugar, Disp: 1 kit, Rfl: 0   buPROPion (WELLBUTRIN XL) 150 MG 24 hr tablet, Take 150 mg by mouth every morning., Disp: , Rfl:    cetirizine (ZYRTEC ALLERGY) 10 MG tablet, Take 2 tablets (20 mg total) by mouth daily., Disp: 30 tablet, Rfl: 0   clobetasol cream (TEMOVATE) 0.05 %, Apply 1 application topically 2 (two) times daily., Disp: , Rfl:    clotrimazole (CLOTRIMAZOLE AF) 1 % cream, Apply 1 application topically 2 (two) times daily., Disp: 60 g, Rfl: 2   Dextromethorphan-Guaifenesin 60-1200 MG 12hr tablet, Take 1 tablet by mouth every 12 (twelve) hours. Prn cough green label, Disp: 30 tablet, Rfl: 0   diphenhydrAMINE (BENADRYL) 25 MG tablet, Take 25 mg by mouth every 6 (six) hours as needed., Disp: , Rfl:    EASY COMFORT PEN NEEDLES 31G X 8 MM MISC, INJECT VICTOZA ONCE DAILY AS DIRECTED, Disp: 100 each, Rfl: 3   EPINEPHrine 0.3 mg/0.3 mL IJ SOAJ injection, Inject 0.3 mg into the muscle as needed for anaphylaxis., Disp: 2 each, Rfl: 3   famotidine  (PEPCID) 40 MG tablet, Take 1 tablet (40 mg total) by mouth daily., Disp: 90 tablet, Rfl: 1   fexofenadine (ALLEGRA) 180 MG tablet, Take 2 tablets by mouth every morning., Disp: , Rfl:    FIBER ADULT GUMMIES PO, Take by mouth., Disp: , Rfl:    FLUoxetine (PROZAC) 40 MG capsule, Take 40 mg by mouth daily., Disp: , Rfl:    fluPHENAZine (PROLIXIN) 2.5 MG tablet, Take 2.5 mg by mouth 2 (two) times daily., Disp: , Rfl:    fluticasone (FLONASE) 50 MCG/ACT nasal spray, Place 2 sprays into both nostrils daily., Disp: 48 g, Rfl: 3   furosemide (LASIX) 20 MG tablet, Take 0.5-1 tablets (10-20 mg total) by mouth daily as needed., Disp: 90 tablet, Rfl: 1   guaiFENesin-codeine 100-10 MG/5ML syrup, Take 5 mLs by mouth every 6 (six)  hours as needed for cough. At night only if taking cough pills / tessalon), Disp: 120 mL, Rfl: 0   hydrocortisone 2.5 % ointment, Apply topically 2 (two) times daily. Left lower abdomen/groin, Disp: 30 g, Rfl: 5   hydroxychloroquine (PLAQUENIL) 200 MG tablet, Take 200 mg by mouth 2 (two) times daily., Disp: , Rfl:    hydrOXYzine (ATARAX/VISTARIL) 25 MG tablet, 1 in the morning, 1 in the afternoon, and 2 in the evening, Disp: 120 tablet, Rfl: 11   Lancet Devices (EASY MINI EJECT LANCING DEVICE) MISC, Use as directed,once daily to test blood sugar, Disp: 1 each, Rfl: 3   Melatonin 10 MG TABS, Take by mouth at bedtime., Disp: , Rfl:    meloxicam (MOBIC) 15 MG tablet, Take 15 mg by mouth daily., Disp: , Rfl:    metoprolol succinate (TOPROL-XL) 25 MG 24 hr tablet, TAKE 1 TABLET BY MOUTH ONCE DAILY WITH OR IMMEDIATELY FOLLOWING A MEAL, Disp: 90 tablet, Rfl: 0   montelukast (SINGULAIR) 10 MG tablet, Take 1 tablet (10 mg total) by mouth daily., Disp: 90 tablet, Rfl: 3   Multiple Vitamins-Minerals (ALIVE MULTI-VITAMIN PO), Take by mouth daily in the afternoon., Disp: , Rfl:    mupirocin ointment (BACTROBAN) 2 %, Apply 1 application topically 2 (two) times daily. Prn right knee wound, Disp: 30 g,  Rfl: 2   omega-3 acid ethyl esters (LOVAZA) 1 g capsule, Take 2 capsules by mouth 2 (two) times daily., Disp: , Rfl:    omeprazole (PRILOSEC) 40 MG capsule, Take 1 capsule (40 mg total) by mouth daily., Disp: 30 capsule, Rfl: 3   oxyCODONE-acetaminophen (PERCOCET) 10-325 MG tablet, Take 1 tablet by mouth 3 (three) times daily as needed., Disp: , Rfl:    OZEMPIC, 1 MG/DOSE, 4 MG/3ML SOPN, Inject 1 mg as directed once a week. Same day. D/c victoza, Disp: 3 mL, Rfl: 2   Probiotic Product (PROBIOTIC DAILY PO), Take by mouth. probiogen probiotic daily, Disp: , Rfl:    QUEtiapine (SEROQUEL XR) 400 MG 24 hr tablet, Take 400 mg by mouth daily., Disp: , Rfl:    topiramate (TOPAMAX) 200 MG tablet, Take 200 mg by mouth daily., Disp: , Rfl:    valACYclovir (VALTREX) 1000 MG tablet, Take 1 tablet (1,000 mg total) by mouth 2 (two) times daily. X 3-7 days, Disp: 90 tablet, Rfl: 0   vitamin B-12 (CYANOCOBALAMIN) 1000 MCG tablet, Take 1,000 mcg by mouth daily., Disp: , Rfl:    zinc oxide 20 % ointment, Apply 1 application topically 2 (two) times daily as needed for irritation., Disp: 60 g, Rfl: 2   omalizumab (XOLAIR) 150 MG/ML prefilled syringe, Inject 300 mg into the skin every 28 (twenty-eight) days., Disp: , Rfl:   EXAM:  VITALS per patient if applicable:  GENERAL: alert, oriented, appears well and in no acute distress   PSYCH/NEURO: pleasant and cooperative, no obvious depression or anxiety, speech and thought processing grossly intact  ASSESSMENT AND PLAN:  Discussed the following assessment and plan:  Morbid obesity with BMI of 40.0-44.9, adult (HCC) - Plan: For home use only DME Other see comment scale, Ambulatory referral to Plastic Surgery  Intertrigo - Plan: Ambulatory referral to Plastic Surgery Dr. Arita Miss   Primary hypertension - Plan: For home use only DME Other see comment  Lymphedema  Arthritis of both knees - Plan: For home use only DME Other see comment Rx 4 prong cane   Other  chronic pain - Plan: For home use only DME Other see  comment  Chronic midline low back pain, unspecified whether sciatica present - Plan: For home use only DME Other see comment low back brace  Essential hypertension - Plan: For home use only DME Other see comment BP cuff   To [facility]    -we discussed possible serious and likely etiologies, options for evaluation and workup, limitations of telemedicine visit vs in person visit, treatment, treatment risks and precautions. Pt is agreeable to treatment via telemedicine at this moment.     I discussed the assessment and treatment plan with the patient. The patient was provided an opportunity to ask questions and all were answered. The patient agreed with the plan and demonstrated an understanding of the instructions.    Time spent 10 minutes  Bevelyn Buckles, MD

## 2021-12-17 NOTE — Telephone Encounter (Signed)
Pt called about an increase in her meloxicam. Dr.James from [facility] wanted her to get an increase.  ?

## 2021-12-17 NOTE — Telephone Encounter (Signed)
Okay for increase in medication?  ?

## 2021-12-18 NOTE — Telephone Encounter (Signed)
Patient informed and verbalized understanding

## 2021-12-23 ENCOUNTER — Other Ambulatory Visit: Payer: Self-pay | Admitting: Internal Medicine

## 2021-12-23 NOTE — Telephone Encounter (Signed)
Patient Pharmacy requesting attached refills I do not see where Lidocaine or Voltaren gel prescribed okay to send. ?

## 2021-12-24 MED ORDER — ALCOHOL PADS 70 % PADS: MEDICATED_PAD | 3 refills | Status: DC

## 2022-01-01 ENCOUNTER — Other Ambulatory Visit: Payer: Self-pay | Admitting: Family

## 2022-01-06 DIAGNOSIS — M25569 Pain in unspecified knee: Secondary | ICD-10-CM | POA: Diagnosis not present

## 2022-01-06 DIAGNOSIS — M797 Fibromyalgia: Secondary | ICD-10-CM | POA: Diagnosis not present

## 2022-01-06 DIAGNOSIS — Z79891 Long term (current) use of opiate analgesic: Secondary | ICD-10-CM | POA: Diagnosis not present

## 2022-01-06 DIAGNOSIS — G894 Chronic pain syndrome: Secondary | ICD-10-CM | POA: Diagnosis not present

## 2022-01-06 DIAGNOSIS — M5451 Vertebrogenic low back pain: Secondary | ICD-10-CM | POA: Diagnosis not present

## 2022-01-06 DIAGNOSIS — Z5181 Encounter for therapeutic drug level monitoring: Secondary | ICD-10-CM | POA: Diagnosis not present

## 2022-01-06 DIAGNOSIS — M47816 Spondylosis without myelopathy or radiculopathy, lumbar region: Secondary | ICD-10-CM | POA: Diagnosis not present

## 2022-01-08 ENCOUNTER — Institutional Professional Consult (permissible substitution): Payer: Medicare Other | Admitting: Plastic Surgery

## 2022-01-17 ENCOUNTER — Telehealth: Payer: Medicare Other

## 2022-01-21 ENCOUNTER — Ambulatory Visit: Payer: Medicare Other

## 2022-01-21 ENCOUNTER — Telehealth: Payer: Self-pay

## 2022-01-21 NOTE — Telephone Encounter (Signed)
Unable to reach patient for scheduled appointment on preferred number in appointment notes [phone] provided by care guide. Unable to leave message. Reschedule as appropriate.  ?

## 2022-01-22 ENCOUNTER — Telehealth: Payer: Self-pay | Admitting: Gastroenterology

## 2022-01-22 NOTE — Telephone Encounter (Signed)
Pt called and left VM asking to return call, I returned pts call at [phone] and received her VM-left message to call us back ?

## 2022-01-23 DIAGNOSIS — H903 Sensorineural hearing loss, bilateral: Secondary | ICD-10-CM | POA: Diagnosis not present

## 2022-01-24 DIAGNOSIS — H905 Unspecified sensorineural hearing loss: Secondary | ICD-10-CM | POA: Diagnosis not present

## 2022-01-24 DIAGNOSIS — M1711 Unilateral primary osteoarthritis, right knee: Secondary | ICD-10-CM | POA: Diagnosis not present

## 2022-01-29 ENCOUNTER — Telehealth: Payer: Self-pay

## 2022-01-29 ENCOUNTER — Ambulatory Visit (INDEPENDENT_AMBULATORY_CARE_PROVIDER_SITE_OTHER): Payer: Medicare Other | Admitting: Plastic Surgery

## 2022-01-29 VITALS — BP 125/67 | HR 89 | Ht 63.0 in | Wt 205.4 lb

## 2022-01-29 DIAGNOSIS — Z411 Encounter for cosmetic surgery: Secondary | ICD-10-CM | POA: Diagnosis not present

## 2022-01-29 DIAGNOSIS — M793 Panniculitis, unspecified: Secondary | ICD-10-CM | POA: Diagnosis not present

## 2022-01-29 NOTE — Progress Notes (Signed)
Referring Provider McLean-Scocuzza, Nino Glow, MD [place],  [place] [id number]   CC:  Chief Complaint  Patient presents with   Consult      Heather Sweeney is an 56 y.o. female.  HPI: Patient presents to discuss her abdominal contour.  She is bothered by overhanging skin and gets rashes beneath her abdominal crease.  Rashes have been refractory to over-the-counter and prescription treatments.  She is also bothered by the contour in the supraumbilical area.  Abdominal surgeries include hysterectomy.  She reports regular tobacco use utilizing cigarettes and dip but feels as though she can quit easily.  She is not a diabetic.  Allergies  Allergen Reactions   Cephalexin Swelling and Rash   Hctz [Hydrochlorothiazide] Rash and Swelling    Mild she says   Lisinopril Hives and Other (See Comments)    Headache, Low BP, angioedema     Losartan Hives and Swelling    Angioedema   Penicillins Rash    Has patient had a PCN reaction causing immediate rash, facial/tongue/throat swelling, SOB or lightheadedness with hypotension: Yes Has patient had a PCN reaction causing severe rash involving mucus membranes or skin necrosis: No Has patient had a PCN reaction that required hospitalization: No Has patient had a PCN reaction occurring within the last 10 years: No If all of the above answers are "NO", then may proceed with Cephalosporin use.    Bee Venom     Swelling    Metformin And Related     Rash     Outpatient Encounter Medications as of 01/29/2022  Medication Sig Note   ACCU-CHEK GUIDE test strip Use as instructed    Accu-Chek Softclix Lancets lancets Use as instructed    Alcohol Swabs (ALCOHOL PADS) 70 % PADS Use as directed,once daily to test blood sugar    amLODipine (NORVASC) 5 MG tablet Take 1 tablet (5 mg total) by mouth daily.    benzonatate (TESSALON) 200 MG capsule Take 1 capsule (200 mg total) by mouth 3 (three) times daily as needed for cough.    benztropine  (COGENTIN) 1 MG tablet Take 1 mg by mouth 2 (two) times daily.    Biotin w/ Vitamins C & E (HAIR/SKIN/NAILS PO) Take 2,500 mcg by mouth daily.    Blood Glucose Calibration (TRUE METRIX LEVEL 1) Low SOLN     Blood Glucose Monitoring Suppl (ACCU-CHEK GUIDE ME) w/Device KIT Use as directed,once daily to test blood sugar    buPROPion (WELLBUTRIN XL) 150 MG 24 hr tablet Take 150 mg by mouth every morning.    cetirizine (ZYRTEC ALLERGY) 10 MG tablet Take 2 tablets (20 mg total) by mouth daily.    clobetasol cream (TEMOVATE) 6.76 % Apply 1 application topically 2 (two) times daily.    clotrimazole (CLOTRIMAZOLE AF) 1 % cream Apply 1 application topically 2 (two) times daily.    Dextromethorphan-Guaifenesin 60-1200 MG 12hr tablet Take 1 tablet by mouth every 12 (twelve) hours. Prn cough green label    diphenhydrAMINE (BENADRYL) 25 MG tablet Take 25 mg by mouth every 6 (six) hours as needed.    EASY COMFORT PEN NEEDLES 31G X 8 MM MISC INJECT VICTOZA ONCE DAILY AS DIRECTED    EPINEPHrine 0.3 mg/0.3 mL IJ SOAJ injection Inject 0.3 mg into the muscle as needed for anaphylaxis.    famotidine (PEPCID) 40 MG tablet Take 1 tablet (40 mg total) by mouth daily.    fexofenadine (ALLEGRA) 180 MG tablet Take 2 tablets by mouth  every morning.    FIBER ADULT GUMMIES PO Take by mouth.    FLUoxetine (PROZAC) 40 MG capsule Take 40 mg by mouth daily.    fluPHENAZine (PROLIXIN) 2.5 MG tablet Take 2.5 mg by mouth 2 (two) times daily.    fluticasone (FLONASE) 50 MCG/ACT nasal spray Place 2 sprays into both nostrils daily.    furosemide (LASIX) 20 MG tablet Take 0.5-1 tablets (10-20 mg total) by mouth daily as needed.    guaiFENesin-codeine 100-10 MG/5ML syrup Take 5 mLs by mouth every 6 (six) hours as needed for cough. At night only if taking cough pills / tessalon)    hydrocortisone 2.5 % ointment Apply topically 2 (two) times daily. Left lower abdomen/groin    hydroxychloroquine (PLAQUENIL) 200 MG tablet Take 200 mg by  mouth 2 (two) times daily.    hydrOXYzine (ATARAX/VISTARIL) 25 MG tablet 1 in the morning, 1 in the afternoon, and 2 in the evening    Lancet Devices (EASY MINI EJECT LANCING DEVICE) MISC Use as directed,once daily to test blood sugar    Melatonin 10 MG TABS Take by mouth at bedtime.    meloxicam (MOBIC) 15 MG tablet Take 15 mg by mouth daily. 03/25/2021: Reports taking as needed   metoprolol succinate (TOPROL-XL) 25 MG 24 hr tablet TAKE 1 TABLET BY MOUTH ONCE DAILY WITH OR IMMEDIATELY FOLLOWING A MEAL    montelukast (SINGULAIR) 10 MG tablet Take 1 tablet (10 mg total) by mouth daily.    Multiple Vitamins-Minerals (ALIVE MULTI-VITAMIN PO) Take by mouth daily in the afternoon.    mupirocin ointment (BACTROBAN) 2 % Apply 1 application topically 2 (two) times daily. Prn right knee wound    omega-3 acid ethyl esters (LOVAZA) 1 g capsule Take 2 capsules by mouth 2 (two) times daily.    omeprazole (PRILOSEC) 40 MG capsule Take 1 capsule (40 mg total) by mouth daily.    oxyCODONE-acetaminophen (PERCOCET) 10-325 MG tablet Take 1 tablet by mouth 3 (three) times daily as needed.    OZEMPIC, 1 MG/DOSE, 4 MG/3ML SOPN Inject 1 mg as directed once a week. Same day. D/c victoza    Probiotic Product (PROBIOTIC DAILY PO) Take by mouth. probiogen probiotic daily    QUEtiapine (SEROQUEL XR) 400 MG 24 hr tablet Take 400 mg by mouth daily.    topiramate (TOPAMAX) 200 MG tablet Take 200 mg by mouth daily.    valACYclovir (VALTREX) 1000 MG tablet Take 1 tablet (1,000 mg total) by mouth 2 (two) times daily. X 3-7 days    vitamin B-12 (CYANOCOBALAMIN) 1000 MCG tablet Take 1,000 mcg by mouth daily.    zinc oxide 20 % ointment Apply 1 application topically 2 (two) times daily as needed for irritation.    omalizumab Arvid Right) 150 MG/ML prefilled syringe Inject 300 mg into the skin every 28 (twenty-eight) days.    No facility-administered encounter medications on file as of 01/29/2022.     Past Medical History:  Diagnosis  Date   Allergic rhinitis    Arthritis of knee, right    Asthma    Bipolar disorder (HCC)    Chronic low back pain    CTS (carpal tunnel syndrome)    hands   Depression    Gallstones    GERD (gastroesophageal reflux disease)    History of prediabetes    Hypertension    Laceration of right foot    Onychomycosis    OSA on CPAP    Overactive bladder    PVC's (premature ventricular  contractions)    PVC's (premature ventricular contractions)    Dr. Raliegh Ip cards   Seizures [facility])    8th grade    Past Surgical History:  Procedure Laterality Date   ABDOMINAL HYSTERECTOMY  07/08/2011   ABDOMINAL HYSTERECTOMY     supracervical in 07/2011    CARPAL TUNNEL RELEASE     right hand 1996   CESAREAN SECTION     2012   CHOLECYSTECTOMY N/A 01/12/2018   Procedure: LAPAROSCOPIC CHOLECYSTECTOMY WITH INTRAOPERATIVE CHOLANGIOGRAM ERAS PATHWAY;  Surgeon: Johnathan Hausen, MD;  Location: [facility];  Service: General;  Laterality: N/A;   COLONOSCOPY WITH PROPOFOL N/A 04/15/2019   Procedure: COLONOSCOPY WITH PROPOFOL;  Surgeon: Jonathon Bellows, MD;  Location: [facility] ENDOSCOPY;  Service: Gastroenterology;  Laterality: N/A;   DILATION AND CURETTAGE OF UTERUS     04/2011 benign endometrial polyp squamous metaplasia, inflamm, blood, mucous, benign    ESOPHAGOGASTRODUODENOSCOPY (EGD) WITH PROPOFOL N/A 10/23/2020   Procedure: ESOPHAGOGASTRODUODENOSCOPY (EGD) WITH PROPOFOL;  Surgeon: Jonathon Bellows, MD;  Location[facility] ENDOSCOPY;  Service: Gastroenterology;  Laterality: N/A;   FLEXIBLE SIGMOIDOSCOPY N/A 02/15/2021   Procedure: FLEXIBLE SIGMOIDOSCOPY;  Surgeon: Jonathon Bellows, MD;  Location: [facility] ENDOSCOPY;  Service: Gastroenterology;  Laterality: N/A;   FOOT SURGERY     Dr. Prudence Davidson    LEEP     2006   TUBAL LIGATION      Family History  Problem Relation Age of Onset   Cancer Father        esophageal    Hypertension Father    Heart failure Mother    Hypertension Mother    Early death Mother    Heart disease Mother        chf    Breast cancer Sister 36   Cancer Sister        breast   Depression Sister    Mental illness Sister    Asthma Son    Cancer Other        brain cancer    Social History   Social History Narrative   Married    No guns    Wears seat belt    Safe in relationship    Never smoker    2 sons Gaspar Bidding ~10 and another son ~28 as of 10/2019   1 granson eli 87 y.o as of 10/2019   2 sisters    Unemployed         Review of Systems General: Denies fevers, chills, weight loss CV: Denies chest pain, shortness of breath, palpitations  Physical Exam    01/29/2022   12:38 PM 12/17/2021    1:30 PM 12/13/2021   10:14 AM  Vitals with BMI  Height _0  _1  _2   Weight 205 lbs 6 oz 204 lbs 207 lbs 10 oz  BMI 36.39 09.73 53.29  Systolic 924  268  Diastolic 67  68  Pulse 89  101    General:  No acute distress,  Alert and oriented, Non-Toxic, Normal speech and affect Abdomen: Abdomen is soft nontender.  No obvious hernias.  Laparoscopic port scars and lower midline scar well-healed.  She has moderate excess skin and adipose tissue in the infra and supraumbilical areas.  Assessment/Plan Patient is a good candidate for infraumbilical panniculectomy combined with upper abdominal liposuction and plication.  I did caution her that there is some additional weight inside her abdomen which I would not have access to that would limit to a certain degree the postoperative contour.  I do feel  like it would address her rashes.  We discussed risks of the procedure that include bleeding, infection, damage to surrounding structures and need for additional procedures.  I discussed that the upper abdominal portion could be deferred if she ultimately preferred to focus on the more functional related infraumbilical part.  She is good to think about that and will provide a quote for her.  All her questions were answered and she is interested in moving forward.  Cindra Presume 01/29/2022, 1:26 PM

## 2022-01-29 NOTE — Telephone Encounter (Signed)
error 

## 2022-02-07 DIAGNOSIS — Z79899 Other long term (current) drug therapy: Secondary | ICD-10-CM | POA: Diagnosis not present

## 2022-02-08 ENCOUNTER — Other Ambulatory Visit: Payer: Self-pay | Admitting: Internal Medicine

## 2022-02-08 DIAGNOSIS — B001 Herpesviral vesicular dermatitis: Secondary | ICD-10-CM

## 2022-02-10 NOTE — Progress Notes (Signed)
S/w Erica from pharmacy and she will be faxing over updated vx list for pt.  ?

## 2022-02-11 ENCOUNTER — Ambulatory Visit (INDEPENDENT_AMBULATORY_CARE_PROVIDER_SITE_OTHER): Payer: Medicare Other | Admitting: Internal Medicine

## 2022-02-11 ENCOUNTER — Encounter: Payer: Self-pay | Admitting: Internal Medicine

## 2022-02-11 VITALS — BP 120/70 | HR 89 | Temp 98.5°F | Resp 14 | Ht 63.0 in | Wt 199.2 lb

## 2022-02-11 DIAGNOSIS — R7989 Other specified abnormal findings of blood chemistry: Secondary | ICD-10-CM

## 2022-02-11 DIAGNOSIS — R634 Abnormal weight loss: Secondary | ICD-10-CM | POA: Diagnosis not present

## 2022-02-11 DIAGNOSIS — I890 Lymphedema, not elsewhere classified: Secondary | ICD-10-CM | POA: Diagnosis not present

## 2022-02-11 DIAGNOSIS — N1831 Chronic kidney disease, stage 3a: Secondary | ICD-10-CM | POA: Diagnosis not present

## 2022-02-11 DIAGNOSIS — R6881 Early satiety: Secondary | ICD-10-CM | POA: Insufficient documentation

## 2022-02-11 DIAGNOSIS — D509 Iron deficiency anemia, unspecified: Secondary | ICD-10-CM | POA: Diagnosis not present

## 2022-02-11 HISTORY — DX: Abnormal weight loss: R63.4

## 2022-02-11 HISTORY — DX: Early satiety: R68.81

## 2022-02-11 LAB — CBC WITH DIFFERENTIAL/PLATELET
Basophils Absolute: 0.1 10*3/uL (ref 0.0–0.1)
Basophils Relative: 1 % (ref 0.0–3.0)
Eosinophils Absolute: 0.2 10*3/uL (ref 0.0–0.7)
Eosinophils Relative: 3.5 % (ref 0.0–5.0)
HCT: 32.4 % — ABNORMAL LOW (ref 36.0–46.0)
Hemoglobin: 11.1 g/dL — ABNORMAL LOW (ref 12.0–15.0)
Lymphocytes Relative: 49.4 % — ABNORMAL HIGH (ref 12.0–46.0)
Lymphs Abs: 2.4 10*3/uL (ref 0.7–4.0)
MCHC: 34.3 g/dL (ref 30.0–36.0)
MCV: 83.4 fl (ref 78.0–100.0)
Monocytes Absolute: 0.4 10*3/uL (ref 0.1–1.0)
Monocytes Relative: 8.4 % (ref 3.0–12.0)
Neutro Abs: 1.8 10*3/uL (ref 1.4–7.7)
Neutrophils Relative %: 37.7 % — ABNORMAL LOW (ref 43.0–77.0)
Platelets: 184 10*3/uL (ref 150.0–400.0)
RBC: 3.88 Mil/uL (ref 3.87–5.11)
RDW: 17.4 % — ABNORMAL HIGH (ref 11.5–15.5)
WBC: 4.8 10*3/uL (ref 4.0–10.5)

## 2022-02-11 LAB — IBC + FERRITIN
Ferritin: 309.3 ng/mL — ABNORMAL HIGH (ref 10.0–291.0)
Iron: 97 ug/dL (ref 42–145)
Saturation Ratios: 36.1 % (ref 20.0–50.0)
TIBC: 268.8 ug/dL (ref 250.0–450.0)
Transferrin: 192 mg/dL — ABNORMAL LOW (ref 212.0–360.0)

## 2022-02-11 LAB — TSH: TSH: 1.49 u[IU]/mL (ref 0.35–5.50)

## 2022-02-11 NOTE — Progress Notes (Signed)
Chief Complaint  ?Patient presents with  ? Follow-up  ?  Disc wt loss, pt states she is tolerating medicine well denies any issues. Pt wants to disc about having blood testing done for cancers.   ? ?F/u  ?1. Weight loss and early satiety and iron def anemia since 01/23/21 wt 247 then 04/17/21 239, then 228 lbs then 224 lbs 10/28/21 and then 217 now 199 eating less due to feeling full. No GLP 1 meds injections in 2-3 months FH cancer brain, breast and throat with recent eGD with dilatation  ? ? ? ?Review of Systems  ?Constitutional:  Positive for malaise/fatigue and weight loss.  ?HENT:  Negative for hearing loss.   ?Eyes:  Negative for blurred vision.  ?Respiratory:  Negative for shortness of breath.   ?Cardiovascular:  Negative for chest pain.  ?Gastrointestinal:  Negative for abdominal pain and blood in stool.  ?     +early satiety  ?Genitourinary:  Negative for dysuria.  ?Musculoskeletal:  Negative for falls and joint pain.  ?Skin:  Negative for rash.  ?Neurological:  Negative for headaches.  ?Psychiatric/Behavioral:  Negative for depression.   ?Past Medical History:  ?Diagnosis Date  ? Allergic rhinitis   ? Arthritis of knee, right   ? Asthma   ? Bipolar disorder ([place])   ? Chronic low back pain   ? CTS (carpal tunnel syndrome)   ? hands  ? Depression   ? Gallstones   ? GERD (gastroesophageal reflux disease)   ? History of prediabetes   ? Hypertension   ? Laceration of right foot   ? Onychomycosis   ? OSA on CPAP   ? Overactive bladder   ? PVC's (premature ventricular contractions)   ? PVC's (premature ventricular contractions)   ? Dr. Raliegh Ip cards  ? Seizures ([place])   ? 8th grade  ? ?Past Surgical History:  ?Procedure Laterality Date  ? ABDOMINAL HYSTERECTOMY  07/08/2011  ? ABDOMINAL HYSTERECTOMY    ? supracervical in 07/2011   ? CARPAL TUNNEL RELEASE    ? right hand 1996  ? CESAREAN SECTION    ? 2012  ? CHOLECYSTECTOMY N/A 01/12/2018  ? Procedure: LAPAROSCOPIC CHOLECYSTECTOMY WITH INTRAOPERATIVE CHOLANGIOGRAM ERAS  PATHWAY;  Surgeon: Johnathan Hausen, MD;  Location: [facility];  Service: General;  Laterality: N/A;  ? COLONOSCOPY WITH PROPOFOL N/A 04/15/2019  ? Procedure: COLONOSCOPY WITH PROPOFOL;  Surgeon: Jonathon Bellows, MD;  Location: [facility] ENDOSCOPY;  Service: Gastroenterology;  Laterality: N/A;  ? DILATION AND CURETTAGE OF UTERUS    ? 04/2011 benign endometrial polyp squamous metaplasia, inflamm, blood, mucous, benign   ? ESOPHAGOGASTRODUODENOSCOPY (EGD) WITH PROPOFOL N/A 10/23/2020  ? Procedure: ESOPHAGOGASTRODUODENOSCOPY (EGD) WITH PROPOFOL;  Surgeon: Jonathon Bellows, MD;  Location: [facility] ENDOSCOPY;  Service: Gastroenterology;  Laterality: N/A;  ? FLEXIBLE SIGMOIDOSCOPY N/A 02/15/2021  ? Procedure: FLEXIBLE SIGMOIDOSCOPY;  Surgeon: Jonathon Bellows, MD;  Location: [facility] ENDOSCOPY;  Service: Gastroenterology;  Laterality: N/A;  ? FOOT SURGERY    ? Dr. Prudence Davidson   ? LEEP    ? 2006  ? TUBAL LIGATION    ? ?Family History  ?Problem Relation Age of Onset  ? Heart failure Mother   ? Hypertension Mother   ? Early death Mother   ? Heart disease Mother   ?     chf  ? Cancer Father   ?     esophageal   ? Hypertension Father   ? Breast cancer Sister 27  ? Cancer Sister   ?  breast  ? Depression Sister   ? Mental illness Sister   ? Asthma Son   ? Cancer Other   ?     brain cancer  ? Cancer Paternal Aunt   ?     brain cancer  ? ?Social History  ? ?Socioeconomic History  ? Marital status: Married  ?  Spouse name: Not on file  ? Number of children: Not on file  ? Years of education: Not on file  ? Highest education level: Not on file  ?Occupational History  ? Not on file  ?Tobacco Use  ? Smoking status: Never  ? Smokeless tobacco: Current  ?  Types: Snuff  ? Tobacco comments:  ?  Is in process of decreasing amount  ?Vaping Use  ? Vaping Use: Never used  ?Substance and Sexual Activity  ? Alcohol use: No  ?  Alcohol/week: 0.0 standard drinks  ? Drug use: No  ? Sexual activity: Not Currently  ?  Birth control/protection: Surgical  ?  Comment: Hysterectomy  ?Other  Topics Concern  ? Not on file  ?Social History Narrative  ? Married   ? No guns   ? Wears seat belt   ? Safe in relationship   ? Never smoker   ? 2 sons Gaspar Bidding ~10 and another son ~28 as of 10/2019  ? 1 granson eli 1 y.o as of 10/2019  ? 2 sisters   ? Unemployed   ?   ? ?Social Determinants of Health  ? ?Financial Resource Strain: Low Risk   ? Difficulty of Paying Living Expenses: Not hard at all  ?Food Insecurity: No Food Insecurity  ? Worried About Charity fundraiser in the Last Year: Never true  ? Ran Out of Food in the Last Year: Never true  ?Transportation Needs: No Transportation Needs  ? Lack of Transportation (Medical): No  ? Lack of Transportation (Non-Medical): No  ?Physical Activity: Not on file  ?Stress: Not on file  ?Social Connections: Not on file  ?Intimate Partner Violence: Not At Risk  ? Fear of Current or Ex-Partner: No  ? Emotionally Abused: No  ? Physically Abused: No  ? Sexually Abused: No  ? ?Current Meds  ?Medication Sig  ? ACCU-CHEK GUIDE test strip Use as instructed  ? Accu-Chek Softclix Lancets lancets Use as instructed  ? Alcohol Swabs (ALCOHOL PADS) 70 % PADS Use as directed,once daily to test blood sugar  ? amLODipine (NORVASC) 5 MG tablet Take 1 tablet (5 mg total) by mouth daily.  ? benzonatate (TESSALON) 200 MG capsule Take 1 capsule (200 mg total) by mouth 3 (three) times daily as needed for cough.  ? benztropine (COGENTIN) 1 MG tablet Take 1 mg by mouth 2 (two) times daily.  ? Biotin w/ Vitamins C & E (HAIR/SKIN/NAILS PO) Take 2,500 mcg by mouth daily.  ? Blood Glucose Calibration (TRUE METRIX LEVEL 1) Low SOLN   ? Blood Glucose Monitoring Suppl (ACCU-CHEK GUIDE ME) w/Device KIT Use as directed,once daily to test blood sugar  ? buPROPion (WELLBUTRIN XL) 150 MG 24 hr tablet Take 150 mg by mouth every morning.  ? cetirizine (ZYRTEC ALLERGY) 10 MG tablet Take 2 tablets (20 mg total) by mouth daily.  ? clobetasol cream (TEMOVATE) 1.70 % Apply 1 application topically 2 (two) times daily.   ? clotrimazole (CLOTRIMAZOLE AF) 1 % cream Apply 1 application topically 2 (two) times daily.  ? Dextromethorphan-Guaifenesin 60-1200 MG 12hr tablet Take 1 tablet by mouth every 12 (twelve)  hours. Prn cough green label  ? diphenhydrAMINE (BENADRYL) 25 MG tablet Take 25 mg by mouth every 6 (six) hours as needed.  ? EASY COMFORT PEN NEEDLES 31G X 8 MM MISC INJECT VICTOZA ONCE DAILY AS DIRECTED  ? EPINEPHrine 0.3 mg/0.3 mL IJ SOAJ injection Inject 0.3 mg into the muscle as needed for anaphylaxis.  ? famotidine (PEPCID) 40 MG tablet Take 1 tablet (40 mg total) by mouth daily.  ? fexofenadine (ALLEGRA) 180 MG tablet Take 2 tablets by mouth every morning.  ? FIBER ADULT GUMMIES PO Take by mouth.  ? FLUoxetine (PROZAC) 40 MG capsule Take 40 mg by mouth daily.  ? fluPHENAZine (PROLIXIN) 2.5 MG tablet Take 2.5 mg by mouth 2 (two) times daily.  ? fluticasone (FLONASE) 50 MCG/ACT nasal spray Place 2 sprays into both nostrils daily.  ? furosemide (LASIX) 20 MG tablet Take 0.5-1 tablets (10-20 mg total) by mouth daily as needed.  ? guaiFENesin-codeine 100-10 MG/5ML syrup Take 5 mLs by mouth every 6 (six) hours as needed for cough. At night only if taking cough pills / tessalon)  ? hydrocortisone 2.5 % ointment Apply topically 2 (two) times daily. Left lower abdomen/groin  ? hydroxychloroquine (PLAQUENIL) 200 MG tablet Take 200 mg by mouth 2 (two) times daily.  ? hydrOXYzine (ATARAX/VISTARIL) 25 MG tablet 1 in the morning, 1 in the afternoon, and 2 in the evening  ? Lancet Devices (EASY MINI EJECT LANCING DEVICE) MISC Use as directed,once daily to test blood sugar  ? Melatonin 10 MG TABS Take by mouth at bedtime.  ? meloxicam (MOBIC) 15 MG tablet Take 15 mg by mouth daily.  ? metoprolol succinate (TOPROL-XL) 25 MG 24 hr tablet TAKE 1 TABLET BY MOUTH ONCE DAILY WITH OR IMMEDIATELY FOLLOWING A MEAL  ? montelukast (SINGULAIR) 10 MG tablet Take 1 tablet (10 mg total) by mouth daily.  ? Multiple Vitamins-Minerals (ALIVE MULTI-VITAMIN  PO) Take by mouth daily in the afternoon.  ? mupirocin ointment (BACTROBAN) 2 % Apply 1 application topically 2 (two) times daily. Prn right knee wound  ? omalizumab Arvid Right) 150 MG/ML prefilled syringe Inj

## 2022-02-11 NOTE — Patient Instructions (Signed)
Dr. Delana Meyer ?Primary Contact Information ? ?Phone Fax E-mail Address  ?[phone] Not available [address]  ? [place] [address]  ?   ?Specialties     ? ? ?

## 2022-02-12 ENCOUNTER — Telehealth: Payer: Self-pay | Admitting: Plastic Surgery

## 2022-02-12 NOTE — Telephone Encounter (Signed)
Called patient to advise her surgery had been approved. Patient does not want to move forward with the cosmetic portion of surgery. Discussed a possible date but patient needed to confirm the dates for a couple of trips she has coming up before we schedule. She will call me back and confirm dates so we can move forward.  ?

## 2022-02-13 LAB — LACTATE DEHYDROGENASE: LDH: 123 U/L (ref 120–250)

## 2022-02-13 LAB — PROTEIN ELECTROPHORESIS, SERUM
Albumin ELP: 3.6 g/dL — ABNORMAL LOW (ref 3.8–4.8)
Alpha 1: 0.3 g/dL (ref 0.2–0.3)
Alpha 2: 0.7 g/dL (ref 0.5–0.9)
Beta 2: 0.4 g/dL (ref 0.2–0.5)
Beta Globulin: 0.4 g/dL (ref 0.4–0.6)
Gamma Globulin: 0.8 g/dL (ref 0.8–1.7)
Total Protein: 6 g/dL — ABNORMAL LOW (ref 6.1–8.1)

## 2022-02-14 LAB — PROTEIN ELECTROPHORESIS, URINE REFLEX
Albumin ELP, Urine: 100 %
Alpha-1-Globulin, U: 0 %
Alpha-2-Globulin, U: 0 %
Beta Globulin, U: 0 %
Gamma Globulin, U: 0 %
Protein, Ur: 8.8 mg/dL

## 2022-02-17 ENCOUNTER — Telehealth: Payer: Self-pay

## 2022-02-17 DIAGNOSIS — H903 Sensorineural hearing loss, bilateral: Secondary | ICD-10-CM | POA: Diagnosis not present

## 2022-02-17 NOTE — Telephone Encounter (Signed)
Faxed surgical clearance to Dr. Terese Door ?

## 2022-02-18 ENCOUNTER — Telehealth: Payer: Self-pay | Admitting: Internal Medicine

## 2022-02-18 NOTE — Telephone Encounter (Signed)
Lft vm on both numbers to call ofc . thanks ?

## 2022-02-19 ENCOUNTER — Telehealth: Payer: Self-pay | Admitting: Internal Medicine

## 2022-02-19 NOTE — Telephone Encounter (Signed)
Pt called in stating that [facility] needs information faxed over to there office from our office... Pt stated that she haven't received her compression stocking from [facility]... Pt stated that the fax number is [phone] ?

## 2022-02-21 ENCOUNTER — Other Ambulatory Visit (INDEPENDENT_AMBULATORY_CARE_PROVIDER_SITE_OTHER): Payer: Self-pay | Admitting: Nurse Practitioner

## 2022-02-24 ENCOUNTER — Ambulatory Visit (INDEPENDENT_AMBULATORY_CARE_PROVIDER_SITE_OTHER): Payer: Medicare Other

## 2022-02-24 ENCOUNTER — Other Ambulatory Visit (INDEPENDENT_AMBULATORY_CARE_PROVIDER_SITE_OTHER): Payer: Self-pay | Admitting: Nurse Practitioner

## 2022-02-24 ENCOUNTER — Encounter (INDEPENDENT_AMBULATORY_CARE_PROVIDER_SITE_OTHER): Payer: Self-pay | Admitting: Nurse Practitioner

## 2022-02-24 ENCOUNTER — Ambulatory Visit (INDEPENDENT_AMBULATORY_CARE_PROVIDER_SITE_OTHER): Payer: Medicare Other | Admitting: Nurse Practitioner

## 2022-02-24 VITALS — BP 124/77 | HR 82 | Resp 16 | Wt 206.8 lb

## 2022-02-24 DIAGNOSIS — I890 Lymphedema, not elsewhere classified: Secondary | ICD-10-CM

## 2022-02-24 DIAGNOSIS — M7989 Other specified soft tissue disorders: Secondary | ICD-10-CM

## 2022-02-25 ENCOUNTER — Telehealth: Payer: Self-pay | Admitting: Internal Medicine

## 2022-02-25 NOTE — Telephone Encounter (Signed)
Lft pt vm to call ofc . thanks 

## 2022-03-01 ENCOUNTER — Other Ambulatory Visit: Payer: Self-pay | Admitting: Family

## 2022-03-03 ENCOUNTER — Encounter (INDEPENDENT_AMBULATORY_CARE_PROVIDER_SITE_OTHER): Payer: Self-pay | Admitting: Nurse Practitioner

## 2022-03-03 NOTE — Progress Notes (Signed)
Subjective:    Patient ID: Gevena Sweeney, female    DOB: Oct 15, 1965, 56 y.o.   MRN: [id number] Chief Complaint  Patient presents with   Follow-up    Chronic lymphedema wearing compression stockings    Heather Sweeney is a 56 year old female that presents today as a referral from her primary care physician in regards to lower extremity edema.  The patient has had swelling for years.  She utilizes medical grade compression stockings on a regular basis.  She is very active and tries to elevate when possible.  She is also active.  She denies any open wounds or ulcerations.  Despite these conservative therapies the patient continues to have issues with swelling and edema.  She has a previous diagnosis of lymphedema.  Today noninvasive studies show evidence of deep venous insufficiency bilaterally.  No evidence of DVT or superficial thrombophlebitis bilaterally.  No evidence of superficial venous reflux bilaterally.   Review of Systems  Cardiovascular:  Positive for leg swelling.  All other systems reviewed and are negative.     Objective:   Physical Exam Vitals reviewed.  HENT:     Head: Normocephalic.  Cardiovascular:     Rate and Rhythm: Normal rate.     Pulses: Normal pulses.  Pulmonary:     Effort: Pulmonary effort is normal.  Musculoskeletal:     Right lower leg: Edema present.     Left lower leg: Edema present.  Skin:    General: Skin is warm and dry.  Neurological:     Mental Status: She is alert and oriented to person, place, and time.  Psychiatric:        Mood and Affect: Mood normal.        Behavior: Behavior normal.        Thought Content: Thought content normal.        Judgment: Judgment normal.    BP 124/77 (BP Location: Right Arm)   Pulse 82   Resp 16   Wt 206 lb 12.8 oz (93.8 kg)   BMI 36.63 kg/m   Past Medical History:  Diagnosis Date   Allergic rhinitis    Arthritis of knee, right    Asthma    Bipolar disorder (HCC)    Chronic low back pain     CTS (carpal tunnel syndrome)    hands   Depression    Gallstones    GERD (gastroesophageal reflux disease)    History of prediabetes    Hypertension    Laceration of right foot    Onychomycosis    OSA on CPAP    Overactive bladder    PVC's (premature ventricular contractions)    PVC's (premature ventricular contractions)    Dr. Raliegh Ip cards   Seizures [facility])    8th grade    Social History   Socioeconomic History   Marital status: Married    Spouse name: Not on file   Number of children: Not on file   Years of education: Not on file   Highest education level: Not on file  Occupational History   Not on file  Tobacco Use   Smoking status: Never   Smokeless tobacco: Current    Types: Snuff   Tobacco comments:    Is in process of decreasing amount  Vaping Use   Vaping Use: Never used  Substance and Sexual Activity   Alcohol use: No    Alcohol/week: 0.0 standard drinks   Drug use: No   Sexual activity: Not Currently  Birth control/protection: Surgical    Comment: Hysterectomy  Other Topics Concern   Not on file  Social History Narrative   Married    No guns    Wears seat belt    Safe in relationship    Never smoker    2 sons Gaspar Bidding ~10 and another son ~28 as of 10/2019   1 granson eli 1 y.o as of 10/2019   2 sisters    Unemployed       Social Determinants of Health   Financial Resource Strain: Low Risk    Difficulty of Paying Living Expenses: Not hard at all  Food Insecurity: Not on file  Transportation Needs: Not on file  Physical Activity: Not on file  Stress: Not on file  Social Connections: Not on file  Intimate Partner Violence: Not on file    Past Surgical History:  Procedure Laterality Date   ABDOMINAL HYSTERECTOMY  07/08/2011   ABDOMINAL HYSTERECTOMY     supracervical in 07/2011    [place]     right hand 1996   CESAREAN SECTION     2012   CHOLECYSTECTOMY N/A 01/12/2018   Procedure: LAPAROSCOPIC CHOLECYSTECTOMY WITH INTRAOPERATIVE  CHOLANGIOGRAM ERAS PATHWAY;  Surgeon: Johnathan Hausen, MD;  Location: [facility] ORS;  Service: General;  Laterality: N/A;   COLONOSCOPY WITH PROPOFOL N/A 04/15/2019   Procedure: COLONOSCOPY WITH PROPOFOL;  Surgeon: Jonathon Bellows, MD;  Location: [facility] ENDOSCOPY;  Service: Gastroenterology;  Laterality: N/A;   DILATION AND CURETTAGE OF UTERUS     04/2011 benign endometrial polyp squamous metaplasia, inflamm, blood, mucous, benign    ESOPHAGOGASTRODUODENOSCOPY (EGD) WITH PROPOFOL N/A 10/23/2020   Procedure: ESOPHAGOGASTRODUODENOSCOPY (EGD) WITH PROPOFOL;  Surgeon: Jonathon Bellows, MD;  Location: [facility] ENDOSCOPY;  Service: Gastroenterology;  Laterality: N/A;   FLEXIBLE SIGMOIDOSCOPY N/A 02/15/2021   Procedure: FLEXIBLE SIGMOIDOSCOPY;  Surgeon: Jonathon Bellows, MD;  Location: [facility] ENDOSCOPY;  Service: Gastroenterology;  Laterality: N/A;   FOOT SURGERY     Dr. Prudence Davidson    LEEP     2006   TUBAL LIGATION      Family History  Problem Relation Age of Onset   Heart failure Mother    Hypertension Mother    Early death Mother    Heart disease Mother        chf   Cancer Father        esophageal    Hypertension Father    Breast cancer Sister 34   Cancer Sister        breast   Depression Sister    Mental illness Sister    Asthma Son    Cancer Other        brain cancer   Cancer Paternal Aunt        brain cancer    Allergies  Allergen Reactions   Cephalexin Swelling and Rash   Hctz [Hydrochlorothiazide] Rash and Swelling    Mild she says   Lisinopril Hives and Other (See Comments)    Headache, Low BP, angioedema     Losartan Hives and Swelling    Angioedema   Penicillins Rash    Has patient had a PCN reaction causing immediate rash, facial/tongue/throat swelling, SOB or lightheadedness with hypotension: Yes Has patient had a PCN reaction causing severe rash involving mucus membranes or skin necrosis: No Has patient had a PCN reaction that required hospitalization: No Has patient had a PCN reaction occurring  within the last 10 years: No If all of the above answers are "NO", then  may proceed with Cephalosporin use.    Bee Venom     Swelling    Metformin And Related     Rash        Latest Ref Rng & Units 02/11/2022   11:35 AM 12/12/2021    9:04 AM 08/01/2021   12:02 PM  CBC  WBC 4.0 - 10.5 K/uL 4.8   6.2   4.6    Hemoglobin 12.0 - 15.0 g/dL 11.1   10.8   11.5    Hematocrit 36.0 - 46.0 % 32.4   32.3   34.5    Platelets 150.0 - 400.0 K/uL 184.0   191.0   187.0        CMP     Component Value Date/Time   NA 141 12/12/2021 0904   NA 138 09/17/2014 0549   K 3.4 (L) 12/12/2021 0904   K 4.0 09/17/2014 0549   CL 103 12/12/2021 0904   CL 103 09/17/2014 0549   CO2 29 12/12/2021 0904   CO2 28 09/17/2014 0549   GLUCOSE 70 12/12/2021 0904   GLUCOSE 105 (H) 09/17/2014 0549   BUN 7 12/12/2021 0904   BUN 9 09/17/2014 0549   CREATININE 1.18 12/12/2021 0904   CREATININE 1.01 05/19/2019 1511   CALCIUM 9.1 12/12/2021 0904   CALCIUM 9.2 09/17/2014 0549   PROT 6.0 (L) 02/11/2022 1135   PROT 7.4 04/04/2013 1451   ALBUMIN 3.8 12/12/2021 0904   ALBUMIN 3.4 04/04/2013 1451   AST 15 12/12/2021 0904   AST 26 04/04/2013 1451   ALT 16 12/12/2021 0904   ALT 34 04/04/2013 1451   ALKPHOS 83 12/12/2021 0904   ALKPHOS 88 04/04/2013 1451   BILITOT 0.4 12/12/2021 0904   BILITOT 0.5 04/04/2013 1451   GFRNONAA >60 01/06/2018 1050   GFRNONAA >60 09/17/2014 0549   GFRNONAA >60 04/04/2013 1451   GFRAA >60 01/06/2018 1050   GFRAA >60 09/17/2014 0549   GFRAA >60 04/04/2013 1451     No results found.     Assessment & Plan:   1. Lymphedema Recommend:  No surgery or intervention at this point in time.    I have reviewed my discussion with the patient regarding lymphedema and why it  causes symptoms.  Patient will continue wearing graduated compression on a daily basis. The patient should put the compression on first thing in the morning and removing them in the evening. The patient should not sleep  in the compression.   In addition, behavioral modification throughout the day will be continued.  This will include frequent elevation (such as in a recliner), use of over the counter pain medications as needed and exercise such as walking.  The systemic causes for chronic edema such as liver, kidney and cardiac etiologies do not appear to have significant changed over the past year.    Despite conservative treatments including graduated compression therapy class 1 and behavioral modification including exercise and elevation the patient  has not obtained adequate control of the lymphedema.  The patient still has stage 3 lymphedema and therefore, I believe that a lymph pump should be added to improve the control of the patient's lymphedema.  Additionally, a lymph pump is warranted because it will reduce the risk of cellulitis and ulceration in the future.  Patient should follow-up in six months     Current Outpatient Medications on File Prior to Visit  Medication Sig Dispense Refill   ACCU-CHEK GUIDE test strip Use as instructed 100 strip 0   Accu-Chek  Softclix Lancets lancets Use as instructed 100 each 0   Alcohol Swabs (ALCOHOL PADS) 70 % PADS Use as directed,once daily to test blood sugar 100 each 3   amLODipine (NORVASC) 5 MG tablet Take 1 tablet (5 mg total) by mouth daily. 90 tablet 3   benzonatate (TESSALON) 200 MG capsule Take 1 capsule (200 mg total) by mouth 3 (three) times daily as needed for cough. 40 capsule 0   benztropine (COGENTIN) 1 MG tablet Take 1 mg by mouth 2 (two) times daily.     Biotin w/ Vitamins C & E (HAIR/SKIN/NAILS PO) Take 2,500 mcg by mouth daily.     Blood Glucose Calibration (TRUE METRIX LEVEL 1) Low SOLN      Blood Glucose Monitoring Suppl (ACCU-CHEK GUIDE ME) w/Device KIT Use as directed,once daily to test blood sugar 1 kit 0   buPROPion (WELLBUTRIN XL) 150 MG 24 hr tablet Take 150 mg by mouth every morning.     cetirizine (ZYRTEC ALLERGY) 10 MG tablet Take 2  tablets (20 mg total) by mouth daily. 30 tablet 0   clobetasol cream (TEMOVATE) 3.00 % Apply 1 application topically 2 (two) times daily.     clotrimazole (CLOTRIMAZOLE AF) 1 % cream Apply 1 application topically 2 (two) times daily. 60 g 2   Dextromethorphan-Guaifenesin 60-1200 MG 12hr tablet Take 1 tablet by mouth every 12 (twelve) hours. Prn cough green label 30 tablet 0   diphenhydrAMINE (BENADRYL) 25 MG tablet Take 25 mg by mouth every 6 (six) hours as needed.     EASY COMFORT PEN NEEDLES 31G X 8 MM MISC INJECT VICTOZA ONCE DAILY AS DIRECTED 100 each 3   EPINEPHrine 0.3 mg/0.3 mL IJ SOAJ injection Inject 0.3 mg into the muscle as needed for anaphylaxis. 2 each 3   famotidine (PEPCID) 40 MG tablet Take 1 tablet (40 mg total) by mouth daily. 90 tablet 1   fexofenadine (ALLEGRA) 180 MG tablet Take 2 tablets by mouth every morning.     FIBER ADULT GUMMIES PO Take by mouth.     FLUoxetine (PROZAC) 40 MG capsule Take 40 mg by mouth daily.     fluPHENAZine (PROLIXIN) 2.5 MG tablet Take 2.5 mg by mouth 2 (two) times daily.     fluticasone (FLONASE) 50 MCG/ACT nasal spray Place 2 sprays into both nostrils daily. 48 g 3   furosemide (LASIX) 20 MG tablet Take 0.5-1 tablets (10-20 mg total) by mouth daily as needed. 90 tablet 1   guaiFENesin-codeine 100-10 MG/5ML syrup Take 5 mLs by mouth every 6 (six) hours as needed for cough. At night only if taking cough pills / tessalon) 120 mL 0   hydrocortisone 2.5 % ointment Apply topically 2 (two) times daily. Left lower abdomen/groin 30 g 5   hydroxychloroquine (PLAQUENIL) 200 MG tablet Take 200 mg by mouth 2 (two) times daily.     hydrOXYzine (ATARAX/VISTARIL) 25 MG tablet 1 in the morning, 1 in the afternoon, and 2 in the evening 120 tablet 11   Lancet Devices (EASY MINI EJECT LANCING DEVICE) MISC Use as directed,once daily to test blood sugar 1 each 3   Melatonin 10 MG TABS Take by mouth at bedtime.     meloxicam (MOBIC) 15 MG tablet Take 15 mg by mouth  daily.     metoprolol succinate (TOPROL-XL) 25 MG 24 hr tablet TAKE 1 TABLET BY MOUTH ONCE DAILY WITH OR IMMEDIATELY FOLLOWING A MEAL 90 tablet 0   montelukast (SINGULAIR) 10 MG tablet Take 1  tablet (10 mg total) by mouth daily. 90 tablet 3   Multiple Vitamins-Minerals (ALIVE MULTI-VITAMIN PO) Take by mouth daily in the afternoon.     mupirocin ointment (BACTROBAN) 2 % Apply 1 application topically 2 (two) times daily. Prn right knee wound 30 g 2   omega-3 acid ethyl esters (LOVAZA) 1 g capsule Take 2 capsules by mouth 2 (two) times daily.     omeprazole (PRILOSEC) 40 MG capsule Take 1 capsule (40 mg total) by mouth daily. 30 capsule 3   oxyCODONE-acetaminophen (PERCOCET) 10-325 MG tablet Take 1 tablet by mouth 3 (three) times daily as needed.     OZEMPIC, 1 MG/DOSE, 4 MG/3ML SOPN Inject 1 mg as directed once a week. Same day. D/c victoza 3 mL 2   Probiotic Product (PROBIOTIC DAILY PO) Take by mouth. probiogen probiotic daily     QUEtiapine (SEROQUEL XR) 400 MG 24 hr tablet Take 400 mg by mouth daily.     topiramate (TOPAMAX) 200 MG tablet Take 200 mg by mouth daily.     valACYclovir (VALTREX) 1000 MG tablet TAKE 1 TABLET BY MOUTH TWICE DAILY FOR 3 FOR 7 DAYS 90 tablet 0   vitamin B-12 (CYANOCOBALAMIN) 1000 MCG tablet Take 1,000 mcg by mouth daily.     zinc oxide 20 % ointment Apply 1 application topically 2 (two) times daily as needed for irritation. 60 g 2   omalizumab (XOLAIR) 150 MG/ML prefilled syringe Inject 300 mg into the skin every 28 (twenty-eight) days.     No current facility-administered medications on file prior to visit.    There are no Patient Instructions on file for this visit. No follow-ups on file.   Kris Hartmann, NP

## 2022-03-04 ENCOUNTER — Other Ambulatory Visit: Payer: Self-pay

## 2022-03-04 ENCOUNTER — Ambulatory Visit (INDEPENDENT_AMBULATORY_CARE_PROVIDER_SITE_OTHER): Payer: Medicare Other | Admitting: Gastroenterology

## 2022-03-04 ENCOUNTER — Other Ambulatory Visit: Payer: Self-pay | Admitting: Gastroenterology

## 2022-03-04 ENCOUNTER — Encounter: Payer: Self-pay | Admitting: Gastroenterology

## 2022-03-04 ENCOUNTER — Telehealth: Payer: Self-pay | Admitting: Internal Medicine

## 2022-03-04 VITALS — BP 118/71 | HR 83 | Temp 97.9°F | Wt 209.0 lb

## 2022-03-04 DIAGNOSIS — K224 Dyskinesia of esophagus: Secondary | ICD-10-CM

## 2022-03-04 DIAGNOSIS — D649 Anemia, unspecified: Secondary | ICD-10-CM

## 2022-03-04 DIAGNOSIS — I10 Essential (primary) hypertension: Secondary | ICD-10-CM

## 2022-03-04 NOTE — Progress Notes (Signed)
Jonathon Bellows MD, MRCP(U.K) [address]  [place]  [place], [place] [address]  Main: [phone]  Fax: [phone]   Primary Care Physician: McLean-Scocuzza, Nino Glow, MD  Primary Gastroenterologist:  Dr. Jonathon Bellows   Chief Complaint  Patient presents with   IDA    HPI: Heather Sweeney is a 56 y.o. female  Summary of history :     Seen on 10/31/2020 for dysphagia likely due to dysmotility from GERD and use of oxycodone, improved after stopping victoza suggesting possible gastroparesis and reflux,  history of heartburn which she uses Tums as needed.  She uses snuff which she puts underneath her lower lip for many years.  Her father had a history of esophageal cancer.  He was a non-smoker.  The patient denies any weight loss.  Liquids are not affected.  Feels like a knot in the center of her chest.   04/15/2019: colonoscopy :normal  10/26/2020: EGD: The stomach and duodenum appeared normal.  There was significantly impaired motility of the esophagus.  There was residual food debris in the lumen.  Biopsies esophagus showed no evidence of eosinophils.   Interval history 02/14/2021-03/04/2022  02/15/2021: Flexible sigmoidoscopy : Normal  Sent to my office today for new onset anemia. Hb 11.2 grams 02/2021, 1 year back was 12.1 grams.  Iron studies show normal ferritin and TIBC.   She is doing well denies any rectal bleeding, hematemesis, melena, nasal bleeds, abdominal discomfort.  She states she is going ahead with the liposuction scheduled soon.  Denies any family history who have had thalassemia's.  Past 1 week her voice has been hoarse after she had a respiratory infection and states she was having some coughing has some discomfort when she swallows. Iron/TIBC/Ferritin/ %Sat    Component Value Date/Time   IRON 97 02/11/2022 1135   TIBC 268.8 02/11/2022 1135   FERRITIN 309.3 (H) 02/11/2022 1135   IRONPCTSAT 36.1 02/11/2022 1135     Current Outpatient Medications   Medication Sig Dispense Refill   ACCU-CHEK GUIDE test strip Use as instructed 100 strip 0   Accu-Chek Softclix Lancets lancets Use as instructed 100 each 0   Alcohol Swabs (ALCOHOL PADS) 70 % PADS Use as directed,once daily to test blood sugar 100 each 3   amLODipine (NORVASC) 5 MG tablet Take 1 tablet (5 mg total) by mouth daily. 90 tablet 3   benzonatate (TESSALON) 200 MG capsule Take 1 capsule (200 mg total) by mouth 3 (three) times daily as needed for cough. 40 capsule 0   benztropine (COGENTIN) 1 MG tablet Take 1 mg by mouth 2 (two) times daily.     Biotin w/ Vitamins C & E (HAIR/SKIN/NAILS PO) Take 2,500 mcg by mouth daily.     Blood Glucose Calibration (TRUE METRIX LEVEL 1) Low SOLN      Blood Glucose Monitoring Suppl (ACCU-CHEK GUIDE ME) w/Device KIT Use as directed,once daily to test blood sugar 1 kit 0   buPROPion (WELLBUTRIN XL) 150 MG 24 hr tablet Take 150 mg by mouth every morning.     celecoxib (CELEBREX) 200 MG capsule Take 1 capsule by mouth 2 (two) times daily as needed.     cetirizine (ZYRTEC ALLERGY) 10 MG tablet Take 2 tablets (20 mg total) by mouth daily. 30 tablet 0   clobetasol cream (TEMOVATE) 4.85 % Apply 1 application topically 2 (two) times daily.     clotrimazole (CLOTRIMAZOLE AF) 1 % cream Apply 1 application topically 2 (two) times daily. [place]  g 2   Dextromethorphan-Guaifenesin 60-1200 MG 12hr tablet Take 1 tablet by mouth every 12 (twelve) hours. Prn cough green label 30 tablet 0   EASY COMFORT PEN NEEDLES 31G X 8 MM MISC INJECT VICTOZA ONCE DAILY AS DIRECTED 100 each 3   EPINEPHrine 0.3 mg/0.3 mL IJ SOAJ injection Inject 0.3 mg into the muscle as needed for anaphylaxis. 2 each 3   famotidine (PEPCID) 40 MG tablet Take 1 tablet (40 mg total) by mouth daily. 90 tablet 1   fexofenadine (ALLEGRA) 180 MG tablet Take 2 tablets by mouth every morning.     FIBER ADULT GUMMIES PO Take by mouth.     FLUoxetine (PROZAC) 40 MG capsule Take 40 mg by mouth daily.      fluPHENAZine (PROLIXIN) 2.5 MG tablet Take 2.5 mg by mouth 2 (two) times daily.     fluticasone (FLONASE) 50 MCG/ACT nasal spray Place 2 sprays into both nostrils daily. 48 g 3   furosemide (LASIX) 20 MG tablet Take 0.5-1 tablets (10-20 mg total) by mouth daily as needed. 90 tablet 1   guaiFENesin-codeine 100-10 MG/5ML syrup Take 5 mLs by mouth every 6 (six) hours as needed for cough. At night only if taking cough pills / tessalon) 120 mL 0   hydrocortisone 2.5 % ointment Apply topically 2 (two) times daily. Left lower abdomen/groin 30 g 5   hydroxychloroquine (PLAQUENIL) 200 MG tablet Take 200 mg by mouth 2 (two) times daily.     Lancet Devices (EASY MINI EJECT LANCING DEVICE) MISC Use as directed,once daily to test blood sugar 1 each 3   M-DRYL 12.5 MG/5ML liquid Take 5 mLs by mouth.     Melatonin 10 MG TABS Take by mouth at bedtime.     meloxicam (MOBIC) 15 MG tablet Take 15 mg by mouth daily.     metoprolol succinate (TOPROL-XL) 25 MG 24 hr tablet TAKE 1 TABLET BY MOUTH ONCE DAILY WITH OR IMMEDIATELY FOLLOWING A MEAL 90 tablet 0   montelukast (SINGULAIR) 10 MG tablet Take 1 tablet (10 mg total) by mouth daily. 90 tablet 3   Multiple Vitamins-Minerals (ALIVE MULTI-VITAMIN PO) Take by mouth daily in the afternoon.     mupirocin ointment (BACTROBAN) 2 % Apply 1 application topically 2 (two) times daily. Prn right knee wound 30 g 2   omega-3 acid ethyl esters (LOVAZA) 1 g capsule Take 2 capsules by mouth 2 (two) times daily.     omeprazole (PRILOSEC) 40 MG capsule Take 1 capsule (40 mg total) by mouth daily. 30 capsule 3   oxyCODONE-acetaminophen (PERCOCET) 10-325 MG tablet Take 1 tablet by mouth 3 (three) times daily as needed.     OZEMPIC, 1 MG/DOSE, 4 MG/3ML SOPN Inject 1 mg as directed once a week. Same day. D/c victoza 3 mL 2   Probiotic Product (PROBIOTIC DAILY PO) Take by mouth. probiogen probiotic daily     QUEtiapine (SEROQUEL XR) 400 MG 24 hr tablet Take 400 mg by mouth daily.      topiramate (TOPAMAX) 200 MG tablet Take 200 mg by mouth daily.     valACYclovir (VALTREX) 1000 MG tablet TAKE 1 TABLET BY MOUTH TWICE DAILY FOR 3 FOR 7 DAYS 90 tablet 0   vitamin B-12 (CYANOCOBALAMIN) 1000 MCG tablet Take 1,000 mcg by mouth daily.     zinc oxide 20 % ointment Apply 1 application topically 2 (two) times daily as needed for irritation. 60 g 2   omalizumab (XOLAIR) 150 MG/ML prefilled syringe Inject 300  mg into the skin every 28 (twenty-eight) days.     No current facility-administered medications for this visit.    Allergies as of 03/04/2022 - Review Complete 03/04/2022  Allergen Reaction Noted   Cephalexin Swelling and Rash 11/01/2015   Hctz [hydrochlorothiazide] Rash and Swelling 11/01/2015   Lisinopril Hives and Other (See Comments) 11/01/2015   Losartan Hives and Swelling 07/08/2017   Penicillins Rash 11/01/2015   Bee venom  12/27/2018   Metformin and related  02/14/2019    ROS:  General: Negative for anorexia, weight loss, fever, chills, fatigue, weakness. ENT: Negative for hoarseness, difficulty swallowing , nasal congestion. CV: Negative for chest pain, angina, palpitations, dyspnea on exertion, peripheral edema.  Respiratory: Negative for dyspnea at rest, dyspnea on exertion, cough, sputum, wheezing.  GI: See history of present illness. GU:  Negative for dysuria, hematuria, urinary incontinence, urinary frequency, nocturnal urination.  Endo: Negative for unusual weight change.    Physical Examination:   BP 118/71   Pulse 83   Temp 97.9 F (36.6 C) (Oral)   Wt 209 lb (94.8 kg)   BMI 37.02 kg/m   General: Well-nourished, well-developed in no acute distress.  Eyes: No icterus. Conjunctivae pink. Mouth: Oropharyngeal mucosa moist and pink , no lesions erythema or exudate. Neuro: Alert and oriented x 3.  Grossly intact. Skin: Warm and dry, no jaundice.   Psych: Alert and cooperative, normal mood and affect.   Imaging Studies: VAS [place] LOWER EXTREMITY  VENOUS REFLUX  Result Date: 02/27/2022  Lower Venous Reflux Study Patient Name:  Heather Sweeney  Date of Exam:   02/24/2022 Medical Rec #: [id number]         Accession #:    [id number] Date of Birth: 1966-04-07          Patient Gender: F Patient Age:   47 years Exam Location:  [facility] Procedure:      VAS [place] LOWER EXTREMITY VENOUS REFLUX Referring Phys: Eulogio Ditch --------------------------------------------------------------------------------  Indications: Swelling.  Performing Technologist: Blondell Reveal RT, RDMS, RVT  Examination Guidelines: A complete evaluation includes B-mode imaging, spectral Doppler, color Doppler, and power Doppler as needed of all accessible portions of each vessel. Bilateral testing is considered an integral part of a complete examination. Limited examinations for reoccurring indications may be performed as noted. The reflux portion of the exam is performed with the patient in reverse Trendelenburg. Significant venous reflux is defined as >500 ms in the superficial venous system, and >1 second in the deep venous system.  Venous Reflux Times +--------------+---------+------+-----------+------------+--------+ RIGHT         Reflux NoRefluxReflux TimeDiameter cmsComments                         Yes                                  +--------------+---------+------+-----------+------------+--------+ CFV           no                                             +--------------+---------+------+-----------+------------+--------+ FV mid        no                                             +--------------+---------+------+-----------+------------+--------+  Popliteal               yes   >1 second                      +--------------+---------+------+-----------+------------+--------+ GSV at SFJ    no                                             +--------------+---------+------+-----------+------------+--------+ GSV prox thighno                                              +--------------+---------+------+-----------+------------+--------+ GSV at knee   no                                             +--------------+---------+------+-----------+------------+--------+ SSV prox calf no                                             +--------------+---------+------+-----------+------------+--------+  +--------------+---------+------+-----------+------------+--------+ LEFT          Reflux NoRefluxReflux TimeDiameter cmsComments                         Yes                                  +--------------+---------+------+-----------+------------+--------+ CFV                     yes   >1 second                      +--------------+---------+------+-----------+------------+--------+ FV mid        no                                             +--------------+---------+------+-----------+------------+--------+ Popliteal     no                                             +--------------+---------+------+-----------+------------+--------+ GSV at SFJ    no                                             +--------------+---------+------+-----------+------------+--------+ GSV prox thighno                                             +--------------+---------+------+-----------+------------+--------+ GSV at knee   no                                             +--------------+---------+------+-----------+------------+--------+  SSV prox calf no                                             +--------------+---------+------+-----------+------------+--------+  Summary: Bilateral: - No evidence of deep vein thrombosis seen in the lower extremities, bilaterally, from the common femoral through the popliteal veins. - No evidence of superficial venous thrombosis in the lower extremities, bilaterally. - No evidence of superficial venous reflux seen in the greater saphenous veins bilaterally. - No  evidence of superficial venous reflux seen in the short saphenous veins bilaterally.  Right: - Venous reflux is noted in the right popliteal vein.  Left: - Venous reflux is noted in the left common femoral vein.  *See table(s) above for measurements and observations. Electronically signed by Hortencia Pilar MD on 02/27/2022 at 7:10:00 PM.    Final     Assessment and Plan:   Heather Sweeney is a 56 y.o. y/o female has been referred back to see me for iron deficiency anemia.  I reviewed her labs.  She definitely has a anemia which has been longstanding.  Hemoglobin has been fluctuating between 11 and 12 g since the past 3 years,  in fact was 11.2 g on 02/10/2019 as well . I do not believe that this requires further evaluation from the GI point of view as her iron studies are completely normal [normal ferritin, no elevation in TIBC].  She does have borderline microcytosis.  She could have a thalassemia trait.  She has had a negative colonoscopy and endoscopy within the past 2 to 3 years.  Past 1 week having some discomfort when she swallows, hoarse voice all began after episode where she was coughing for a few days.  Plan  Check b12,folate, copper, hemoglobin electrophoresis No further GI work-up.  If concerns persist for the chronic mild anemia then consider referral to hematology. 3.  Hoarse voice and some discomfort during swallowing likely due to upper respite tract infection that she has had recently advised to her if it does not get better in a week or 2 to give me a call.  She has had a history suggestive of esophageal dysmotility in the past   Dr Jonathon Bellows  MD,MRCP [facility]) Follow up in as needed

## 2022-03-04 NOTE — Progress Notes (Signed)
CMP ordered due to being required before CT scan.

## 2022-03-04 NOTE — Telephone Encounter (Signed)
Lvm for pt to return call to sch lab appt before 03/11/22 for CMP which is required before CT scan. Order placed pls sch.

## 2022-03-04 NOTE — Addendum Note (Signed)
Addended by: Wayna Chalet on: 03/04/2022 03:16 PM   Modules accepted: Orders

## 2022-03-04 NOTE — Telephone Encounter (Signed)
Pt is needing cmp lab orders to de done at [facility] before CT's. Pt is scheduled for CT's on 03/11/2022. Please advise and Thank you!

## 2022-03-05 LAB — COMPREHENSIVE METABOLIC PANEL
ALT: 16 IU/L (ref 0–32)
AST: 13 IU/L (ref 0–40)
Albumin/Globulin Ratio: 1.9 (ref 1.2–2.2)
Albumin: 4.3 g/dL (ref 3.8–4.9)
Alkaline Phosphatase: 77 IU/L (ref 44–121)
BUN/Creatinine Ratio: 6 — ABNORMAL LOW (ref 9–23)
BUN: 8 mg/dL (ref 6–24)
Bilirubin Total: 0.2 mg/dL (ref 0.0–1.2)
CO2: 23 mmol/L (ref 20–29)
Calcium: 9.2 mg/dL (ref 8.7–10.2)
Chloride: 106 mmol/L (ref 96–106)
Creatinine, Ser: 1.39 mg/dL — ABNORMAL HIGH (ref 0.57–1.00)
Globulin, Total: 2.3 g/dL (ref 1.5–4.5)
Glucose: 96 mg/dL (ref 70–99)
Potassium: 4.2 mmol/L (ref 3.5–5.2)
Sodium: 143 mmol/L (ref 134–144)
Total Protein: 6.6 g/dL (ref 6.0–8.5)
eGFR: 45 mL/min/{1.73_m2} — ABNORMAL LOW (ref >59)

## 2022-03-06 ENCOUNTER — Telehealth: Payer: Self-pay | Admitting: Internal Medicine

## 2022-03-06 ENCOUNTER — Ambulatory Visit: Admit: 2022-03-06 | Discharge: 2022-03-07 | Payer: MEDICARE

## 2022-03-06 DIAGNOSIS — Z880 Allergy status to penicillin: Principal | ICD-10-CM

## 2022-03-06 DIAGNOSIS — L501 Idiopathic urticaria: Principal | ICD-10-CM

## 2022-03-06 DIAGNOSIS — T782XXA Anaphylactic shock, unspecified, initial encounter: Principal | ICD-10-CM

## 2022-03-06 LAB — B12 AND FOLATE PANEL
Folate: 16.7 ng/mL (ref >3.0)
Vitamin B-12: >2000 pg/mL — ABNORMAL HIGH (ref 232–1245)

## 2022-03-06 LAB — COPPER, SERUM: Copper: 108 ug/dL (ref 80–158)

## 2022-03-06 MED ORDER — EPINEPHRINE 0.3 MG/0.3 ML INJECTION, AUTO-INJECTOR
Freq: Once | INTRAMUSCULAR | PRN refills | 1 days | Status: CP
Start: 2022-03-06 — End: 2022-03-06

## 2022-03-06 NOTE — Telephone Encounter (Signed)
Patient is requesting refills on her, OZEMPIC, 1 MG/DOSE, 4 MG/3ML SOPN, amLODipine (NORVASC) 5 MG tablet, benztropine (COGENTIN) 1 MG tablet, cetirizine (ZYRTEC ALLERGY) 10 MG tablet, EPINEPHrine 0.3   mg/0.3 mL IJ SOAJ injection, famotidine (PEPCID) 40 MG tablet, fexofenadine (ALLEGRA) 180 MG tablet, FLUoxetine (PROZAC) 40 MG capsule,  fluPHENAZine (PROLIXIN) 2.5 MG tablet, fluticasone (FLONASE) 50   MCG/ACT nasal spray, hydroxychloroquine (PLAQUENIL) 200 MG tablet, montelukast (SINGULAIR) 10 MG tablet, oxyCODONE-acetaminophen (PERCOCET) 10-325 MG tablet, topiramate (TOPAMAX) 200 MG tablet.  Patient states if she missed anything, she needs all her current medication refilled.

## 2022-03-07 ENCOUNTER — Other Ambulatory Visit: Payer: Self-pay | Admitting: Internal Medicine

## 2022-03-07 ENCOUNTER — Other Ambulatory Visit (INDEPENDENT_AMBULATORY_CARE_PROVIDER_SITE_OTHER): Payer: Medicare Other

## 2022-03-07 DIAGNOSIS — I10 Essential (primary) hypertension: Secondary | ICD-10-CM | POA: Diagnosis not present

## 2022-03-07 DIAGNOSIS — Z6837 Body mass index (BMI) 37.0-37.9, adult: Secondary | ICD-10-CM

## 2022-03-07 DIAGNOSIS — T782XXA Anaphylactic shock, unspecified, initial encounter: Secondary | ICD-10-CM

## 2022-03-07 DIAGNOSIS — L509 Urticaria, unspecified: Secondary | ICD-10-CM

## 2022-03-07 DIAGNOSIS — J309 Allergic rhinitis, unspecified: Secondary | ICD-10-CM

## 2022-03-07 DIAGNOSIS — R600 Localized edema: Secondary | ICD-10-CM

## 2022-03-07 DIAGNOSIS — Z87898 Personal history of other specified conditions: Secondary | ICD-10-CM

## 2022-03-07 DIAGNOSIS — L304 Erythema intertrigo: Secondary | ICD-10-CM

## 2022-03-07 DIAGNOSIS — T753XXA Motion sickness, initial encounter: Secondary | ICD-10-CM

## 2022-03-07 HISTORY — DX: Body mass index (BMI) 37.0-37.9, adult: Z68.37

## 2022-03-07 LAB — HGB FRACTIONATION BY HPLC
Hgb A2: 3.3 % — ABNORMAL HIGH (ref 1.8–3.2)
Hgb A: 59.3 % — ABNORMAL LOW (ref 96.4–98.8)
Hgb C: 37.4 % — ABNORMAL HIGH
Hgb E: 0 %
Hgb F: 0 % (ref 0.0–2.0)
Hgb S: 0 %
Hgb Variant: 0 %

## 2022-03-07 LAB — COMPREHENSIVE METABOLIC PANEL
ALT: 14 U/L (ref 0–35)
AST: 14 U/L (ref 0–37)
Albumin: 3.7 g/dL (ref 3.5–5.2)
Alkaline Phosphatase: 67 U/L (ref 39–117)
BUN: 13 mg/dL (ref 6–23)
CO2: 26 mEq/L (ref 19–32)
Calcium: 8.9 mg/dL (ref 8.4–10.5)
Chloride: 108 mEq/L (ref 96–112)
Creatinine, Ser: 1.21 mg/dL — ABNORMAL HIGH (ref 0.40–1.20)
GFR: 51.08 mL/min — ABNORMAL LOW (ref >60.00)
Glucose, Bld: 100 mg/dL — ABNORMAL HIGH (ref 70–99)
Potassium: 3.9 mEq/L (ref 3.5–5.1)
Sodium: 140 mEq/L (ref 135–145)
Total Bilirubin: 0.3 mg/dL (ref 0.2–1.2)
Total Protein: 5.9 g/dL — ABNORMAL LOW (ref 6.0–8.3)

## 2022-03-07 LAB — HGB FRACTIONATION CASCADE

## 2022-03-07 MED ORDER — HYDROCORTISONE 2.5 % EX OINT: TOPICAL_OINTMENT | Freq: Two times a day (BID) | CUTANEOUS | 5 refills | Status: DC

## 2022-03-07 MED ORDER — FLUTICASONE PROPIONATE 50 MCG/ACT NA SUSP: 2.0000 | Freq: Every day | NASAL | 3 refills | Status: DC

## 2022-03-07 MED ORDER — SCOPOLAMINE 1 MG/3DAYS TD PT72: 1.0000 | MEDICATED_PATCH | TRANSDERMAL | 0 refills | Status: DC

## 2022-03-07 MED ORDER — FUROSEMIDE 20 MG PO TABS: 10.0000 mg | ORAL_TABLET | Freq: Every day | ORAL | 1 refills | Status: DC | PRN

## 2022-03-07 MED ORDER — AMLODIPINE BESYLATE 5 MG PO TABS: 5.0000 mg | ORAL_TABLET | Freq: Every day | ORAL | 3 refills | Status: DC

## 2022-03-07 MED ORDER — SEMAGLUTIDE(0.25 OR 0.5MG/DOS) 2 MG/3ML ~~LOC~~ SOPN
0.2500 mg | PEN_INJECTOR | Freq: Every day | SUBCUTANEOUS | 2 refills | Status: DC
Start: 2022-03-07 — End: 2022-04-04

## 2022-03-07 MED ORDER — EPINEPHRINE 0.3 MG/0.3ML IJ SOAJ: 0.3000 mg | INTRAMUSCULAR | 3 refills | Status: AC | PRN

## 2022-03-07 MED ORDER — METOPROLOL SUCCINATE ER 25 MG PO TB24: 25.0000 mg | ORAL_TABLET | Freq: Every day | ORAL | 3 refills | Status: DC

## 2022-03-07 MED ORDER — FAMOTIDINE 40 MG PO TABS
40.0000 mg | ORAL_TABLET | Freq: Every day | ORAL | 3 refills | Status: DC
Start: 2022-03-07 — End: 2022-12-22

## 2022-03-07 MED ORDER — OMEPRAZOLE 40 MG PO CPDR: 40.0000 mg | DELAYED_RELEASE_CAPSULE | Freq: Every day | ORAL | 3 refills | Status: DC

## 2022-03-07 MED ORDER — MONTELUKAST SODIUM 10 MG PO TABS: 10.0000 mg | ORAL_TABLET | Freq: Every day | ORAL | 3 refills | Status: DC

## 2022-03-07 MED ORDER — FEXOFENADINE HCL 180 MG PO TABS: 360.0000 mg | ORAL_TABLET | Freq: Every morning | ORAL | 3 refills | Status: DC

## 2022-03-07 MED ORDER — CETIRIZINE HCL 10 MG PO TABS: 20.0000 mg | ORAL_TABLET | Freq: Every day | ORAL | 3 refills | Status: DC

## 2022-03-07 NOTE — Telephone Encounter (Signed)
Seeing this message I dont refill her psych meds or pain meds or topamax she will have to contact those doctors for Rx to [facility] for her

## 2022-03-08 NOTE — Progress Notes (Signed)
Heather Sweeney   Inform patient has Hb C - explains anemia that is chronic , nothing needs to be done . No further GI work up for anemia   C/c McLean-Scocuzza, Nino Glow, MD   Dr Jonathon Bellows MD,MRCP [facility]) Gastroenterology/Hepatology Pager: [phone]

## 2022-03-08 NOTE — Progress Notes (Signed)
Heather Sweeney   Inform creatitine has gone up - suggest to hold furosemide, stop any nsaids and follow up with McLean-Scocuzza, Nino Glow, MD   Dr Jonathon Bellows MD,MRCP [facility]) Gastroenterology/Hepatology Pager: [phone]

## 2022-03-11 ENCOUNTER — Inpatient Hospital Stay: Admission: RE | Admit: 2022-03-11 | Payer: Medicare Other | Source: Ambulatory Visit

## 2022-03-11 ENCOUNTER — Telehealth: Payer: Self-pay

## 2022-03-11 ENCOUNTER — Ambulatory Visit
Admission: RE | Admit: 2022-03-11 | Discharge: 2022-03-11 | Disposition: A | Discharge status: Home / Self Care | Payer: Medicare Other | Source: Ambulatory Visit | Attending: Internal Medicine | Admitting: Internal Medicine

## 2022-03-11 DIAGNOSIS — I3139 Other pericardial effusion (noninflammatory): Secondary | ICD-10-CM | POA: Diagnosis not present

## 2022-03-11 DIAGNOSIS — R634 Abnormal weight loss: Secondary | ICD-10-CM | POA: Insufficient documentation

## 2022-03-11 DIAGNOSIS — R6881 Early satiety: Secondary | ICD-10-CM | POA: Insufficient documentation

## 2022-03-11 DIAGNOSIS — J479 Bronchiectasis, uncomplicated: Secondary | ICD-10-CM | POA: Insufficient documentation

## 2022-03-11 MED ORDER — IOHEXOL 300 MG/ML  SOLN
100.0000 mL | Freq: Once | INTRAMUSCULAR | Status: AC | PRN
  Administered 2022-03-11: 100 mL via INTRAVENOUS

## 2022-03-11 NOTE — Telephone Encounter (Signed)
Called patient about her results no answer left voicemail

## 2022-03-13 ENCOUNTER — Telehealth: Payer: Self-pay | Admitting: Internal Medicine

## 2022-03-13 NOTE — Telephone Encounter (Signed)
Ms Heather Sweeney from [facility], [phone] and fax [phone]. She is asking for notes that address patient's swelling in legs to back up getting thigh highs, compression socks, stockings. Please fax

## 2022-03-16 DIAGNOSIS — N1831 Chronic kidney disease, stage 3a: Secondary | ICD-10-CM | POA: Insufficient documentation

## 2022-03-16 NOTE — Addendum Note (Signed)
Addended by: Orland Mustard on: 03/16/2022 11:54 PM   Modules accepted: Orders

## 2022-03-18 ENCOUNTER — Ambulatory Visit: Payer: Medicare Other | Admitting: Internal Medicine

## 2022-03-18 NOTE — Telephone Encounter (Signed)
Call pt I and medical supply I dont think I wrote the Rx for compression stocks ask pharmacy whos name is on Rx  I should have some documented notes over the years indicating lymphedema fax this to this medical supply please with codes lymphedema and leg swelling   I89.0, R60.9 icd 10 codes thank you

## 2022-03-18 NOTE — Telephone Encounter (Signed)
Sending to you

## 2022-03-20 DIAGNOSIS — Z79891 Long term (current) use of opiate analgesic: Secondary | ICD-10-CM | POA: Diagnosis not present

## 2022-03-20 DIAGNOSIS — M5451 Vertebrogenic low back pain: Secondary | ICD-10-CM | POA: Diagnosis not present

## 2022-03-20 DIAGNOSIS — Z5181 Encounter for therapeutic drug level monitoring: Secondary | ICD-10-CM | POA: Diagnosis not present

## 2022-03-20 DIAGNOSIS — M47816 Spondylosis without myelopathy or radiculopathy, lumbar region: Secondary | ICD-10-CM | POA: Diagnosis not present

## 2022-03-20 DIAGNOSIS — M797 Fibromyalgia: Secondary | ICD-10-CM | POA: Diagnosis not present

## 2022-03-20 DIAGNOSIS — M25569 Pain in unspecified knee: Secondary | ICD-10-CM | POA: Diagnosis not present

## 2022-03-20 DIAGNOSIS — G894 Chronic pain syndrome: Secondary | ICD-10-CM | POA: Diagnosis not present

## 2022-03-31 ENCOUNTER — Telehealth: Payer: Self-pay | Admitting: Internal Medicine

## 2022-04-01 ENCOUNTER — Other Ambulatory Visit: Payer: Self-pay | Admitting: Internal Medicine

## 2022-04-01 DIAGNOSIS — B001 Herpesviral vesicular dermatitis: Secondary | ICD-10-CM

## 2022-04-03 DIAGNOSIS — M1711 Unilateral primary osteoarthritis, right knee: Secondary | ICD-10-CM | POA: Diagnosis not present

## 2022-04-04 ENCOUNTER — Encounter: Payer: Self-pay | Admitting: Internal Medicine

## 2022-04-04 ENCOUNTER — Ambulatory Visit (INDEPENDENT_AMBULATORY_CARE_PROVIDER_SITE_OTHER): Payer: Medicare Other | Admitting: Internal Medicine

## 2022-04-04 VITALS — BP 130/70 | HR 77 | Temp 98.0°F | Resp 14 | Ht 63.0 in | Wt 209.0 lb

## 2022-04-04 DIAGNOSIS — S81001A Unspecified open wound, right knee, initial encounter: Secondary | ICD-10-CM

## 2022-04-04 DIAGNOSIS — I10 Essential (primary) hypertension: Secondary | ICD-10-CM | POA: Diagnosis not present

## 2022-04-04 DIAGNOSIS — J479 Bronchiectasis, uncomplicated: Secondary | ICD-10-CM | POA: Insufficient documentation

## 2022-04-04 DIAGNOSIS — L603 Nail dystrophy: Secondary | ICD-10-CM

## 2022-04-04 DIAGNOSIS — Z6837 Body mass index (BMI) 37.0-37.9, adult: Secondary | ICD-10-CM

## 2022-04-04 DIAGNOSIS — E785 Hyperlipidemia, unspecified: Secondary | ICD-10-CM | POA: Diagnosis not present

## 2022-04-04 HISTORY — DX: Bronchiectasis, uncomplicated: J47.9

## 2022-04-04 MED ORDER — MUPIROCIN 2 % EX OINT: 1.0000 | TOPICAL_OINTMENT | Freq: Two times a day (BID) | CUTANEOUS | 2 refills | Status: DC

## 2022-04-04 MED ORDER — VICTOZA 18 MG/3ML ~~LOC~~ SOPN: 0.6000 mg | PEN_INJECTOR | Freq: Every day | SUBCUTANEOUS | 11 refills | Status: DC

## 2022-04-04 MED ORDER — ALBUTEROL SULFATE HFA 108 (90 BASE) MCG/ACT IN AERS: 1.0000 | INHALATION_SPRAY | Freq: Four times a day (QID) | RESPIRATORY_TRACT | 11 refills | Status: AC | PRN

## 2022-04-04 NOTE — Progress Notes (Addendum)
Chief Complaint  Patient presents with   New Med Request    Pt states she would like to go back on victoza states it helped out a lot. Pt had shot in knee yesterday & saw Dr.Kwan 3 wks ago allergist   F/u  1. She now wants victoza again for wt loss with goal 180 lbs and is not concerned with losing wt 2. Reviewed ct chest ab/pelvis +bronchiectasis she does have cough with mucous production and wheezing worse at night  3. Right knee pain chronic had injection yesterday    Review of Systems  Constitutional:  Negative for weight loss.  HENT:  Negative for hearing loss.   Eyes:  Negative for blurred vision.  Respiratory:  Negative for shortness of breath.   Cardiovascular:  Negative for chest pain.  Gastrointestinal:  Negative for abdominal pain and blood in stool.  Genitourinary:  Negative for dysuria.  Musculoskeletal:  Negative for falls and joint pain.  Skin:  Negative for rash.  Neurological:  Negative for headaches.  Psychiatric/Behavioral:  Negative for depression.    Past Medical History:  Diagnosis Date   Allergic rhinitis    Arthritis of knee, right    Asthma    Bipolar disorder ([place])    Chronic low back pain    CTS (carpal tunnel syndrome)    hands   Depression    Gallstones    GERD (gastroesophageal reflux disease)    History of prediabetes    Hypertension    Laceration of right foot    Onychomycosis    OSA on CPAP    Overactive bladder    PVC's (premature ventricular contractions)    PVC's (premature ventricular contractions)    Dr. Raliegh Ip cards   Seizures [facility])    8th grade   Past Surgical History:  Procedure Laterality Date   ABDOMINAL HYSTERECTOMY  07/08/2011   ABDOMINAL HYSTERECTOMY     supracervical in 07/2011    [place]     right hand [place]     2012   CHOLECYSTECTOMY N/A 01/12/2018   Procedure: LAPAROSCOPIC CHOLECYSTECTOMY WITH INTRAOPERATIVE CHOLANGIOGRAM ERAS PATHWAY;  Surgeon: Johnathan Hausen, MD;  Location: [facility] ORS;   Service: General;  Laterality: N/A;   COLONOSCOPY WITH PROPOFOL N/A 04/15/2019   Procedure: COLONOSCOPY WITH PROPOFOL;  Surgeon: Jonathon Bellows, MD;  Location: [facility] ENDOSCOPY;  Service: Gastroenterology;  Laterality: N/A;   DILATION AND CURETTAGE OF UTERUS     04/2011 benign endometrial polyp squamous metaplasia, inflamm, blood, mucous, benign    ESOPHAGOGASTRODUODENOSCOPY (EGD) WITH PROPOFOL N/A 10/23/2020   Procedure: ESOPHAGOGASTRODUODENOSCOPY (EGD) WITH PROPOFOL;  Surgeon: Jonathon Bellows, MD;  Location: [facility] ENDOSCOPY;  Service: Gastroenterology;  Laterality: N/A;   FLEXIBLE SIGMOIDOSCOPY N/A 02/15/2021   Procedure: FLEXIBLE SIGMOIDOSCOPY;  Surgeon: Jonathon Bellows, MD;  Location: [facility] ENDOSCOPY;  Service: Gastroenterology;  Laterality: N/A;   FOOT SURGERY     Dr. Prudence Davidson    LEEP     2006   TUBAL LIGATION     Family History  Problem Relation Age of Onset   Heart failure Mother    Hypertension Mother    Early death Mother    Heart disease Mother        chf   Cancer Father        esophageal    Hypertension Father    Breast cancer Sister 61   Cancer Sister        breast   Depression Sister    Mental illness  Sister    Asthma Son    Cancer Other        brain cancer   Cancer Paternal Aunt        brain cancer   Social History   Socioeconomic History   Marital status: Married    Spouse name: Not on file   Number of children: Not on file   Years of education: Not on file   Highest education level: Not on file  Occupational History   Not on file  Tobacco Use   Smoking status: Never   Smokeless tobacco: Current    Types: Snuff   Tobacco comments:    Is in process of decreasing amount  Vaping Use   Vaping Use: Never used  Substance and Sexual Activity   Alcohol use: No    Alcohol/week: 0.0 standard drinks of alcohol   Drug use: No   Sexual activity: Not Currently    Birth control/protection: Surgical    Comment: Hysterectomy  Other Topics Concern   Not on file  Social History  Narrative   Married    No guns    Wears seat belt    Safe in relationship    Never smoker    2 sons Gaspar Bidding ~10 and another son ~28 as of 10/2019   1 granson eli 1 y.o as of 10/2019   2 sisters    Unemployed       Social Determinants of Health   Financial Resource Strain: Low Risk  (09/13/2021)   Overall Financial Resource Strain (CARDIA)    Difficulty of Paying Living Expenses: Not hard at all  Food Insecurity: No Food Insecurity (02/25/2021)   Hunger Vital Sign    Worried About Running Out of Food in the Last Year: Never true    Ran Out of Food in the Last Year: Never true  Transportation Needs: No Transportation Needs (02/25/2021)   PRAPARE - Hydrologist (Medical): No    Lack of Transportation (Non-Medical): No  Physical Activity: Insufficiently Active (07/07/2019)   Exercise Vital Sign    Days of Exercise per Week: 3 days    Minutes of Exercise per Session: 30 min  Stress: No Stress Concern Present (10/01/2020)   [place]    Feeling of Stress : Not at all  Social Connections: Unknown (10/01/2020)   Social Connection and Isolation Panel [NHANES]    Frequency of Communication with Friends and Family: Not on file    Frequency of Social Gatherings with Friends and Family: Not on file    Attends Religious Services: Not on file    Active Member of Clubs or Organizations: Not on file    Attends Archivist Meetings: Not on file    Marital Status: Married  Intimate Partner Violence: Not At Risk (02/25/2021)   Humiliation, Afraid, Rape, and Kick questionnaire    Fear of Current or Ex-Partner: No    Emotionally Abused: No    Physically Abused: No    Sexually Abused: No   Current Meds  Medication Sig   ACCU-CHEK GUIDE test strip Use as instructed   Accu-Chek Softclix Lancets lancets Use as instructed   albuterol (VENTOLIN HFA) 108 (90 Base) MCG/ACT inhaler Inhale 1-2 puffs into  the lungs every 6 (six) hours as needed for wheezing or shortness of breath.   Alcohol Swabs (ALCOHOL PADS) 70 % PADS Use as directed,once daily to test blood sugar   amLODipine (NORVASC)  5 MG tablet Take 1 tablet (5 mg total) by mouth daily.   benzonatate (TESSALON) 200 MG capsule Take 1 capsule (200 mg total) by mouth 3 (three) times daily as needed for cough.   benztropine (COGENTIN) 1 MG tablet Take 1 mg by mouth 2 (two) times daily.   Biotin w/ Vitamins C & E (HAIR/SKIN/NAILS PO) Take 2,500 mcg by mouth daily.   Blood Glucose Calibration (TRUE METRIX LEVEL 1) Low SOLN    Blood Glucose Monitoring Suppl (ACCU-CHEK GUIDE ME) w/Device KIT Use as directed,once daily to test blood sugar   buPROPion (WELLBUTRIN XL) 150 MG 24 hr tablet Take 150 mg by mouth every morning.   celecoxib (CELEBREX) 200 MG capsule Take 1 capsule by mouth 2 (two) times daily as needed.   cetirizine (ZYRTEC ALLERGY) 10 MG tablet Take 2 tablets (20 mg total) by mouth daily.   clobetasol cream (TEMOVATE) 2.11 % Apply 1 application topically 2 (two) times daily.   clotrimazole (CLOTRIMAZOLE AF) 1 % cream Apply 1 application topically 2 (two) times daily.   Dextromethorphan-Guaifenesin 60-1200 MG 12hr tablet Take 1 tablet by mouth every 12 (twelve) hours. Prn cough green label   EASY COMFORT PEN NEEDLES 31G X 8 MM MISC INJECT VICTOZA ONCE DAILY AS DIRECTED   EPINEPHrine 0.3 mg/0.3 mL IJ SOAJ injection Inject 0.3 mg into the muscle as needed for anaphylaxis.   famotidine (PEPCID) 40 MG tablet Take 1 tablet (40 mg total) by mouth daily.   fexofenadine (ALLEGRA) 180 MG tablet Take 2 tablets (360 mg total) by mouth every morning. Prn only if needed either take zyrtec or allegra explain to patient   FIBER ADULT GUMMIES PO Take by mouth.   FLUoxetine (PROZAC) 40 MG capsule Take 40 mg by mouth daily.   fluPHENAZine (PROLIXIN) 2.5 MG tablet Take 2.5 mg by mouth 2 (two) times daily.   fluticasone (FLONASE) 50 MCG/ACT nasal spray Place  2 sprays into both nostrils daily.   furosemide (LASIX) 20 MG tablet Take 0.5-1 tablets (10-20 mg total) by mouth daily as needed.   hydrocortisone 2.5 % ointment Apply topically 2 (two) times daily. Neck   hydroxychloroquine (PLAQUENIL) 200 MG tablet Take 200 mg by mouth 2 (two) times daily.   Lancet Devices (EASY MINI EJECT LANCING DEVICE) MISC Use as directed,once daily to test blood sugar   liraglutide (VICTOZA) 18 MG/3ML SOPN Inject 0.6 mg into the skin daily. X 1 week, 1.2 qd x week 2, 1.8 qd x week 3, 2.4 qd x week 4 and 3 mg qd x week 5 and beyond goal wt 180s   M-DRYL 12.5 MG/5ML liquid Take 5 mLs by mouth.   Melatonin 10 MG TABS Take by mouth at bedtime.   meloxicam (MOBIC) 15 MG tablet Take 15 mg by mouth daily.   metoprolol succinate (TOPROL-XL) 25 MG 24 hr tablet Take 1 tablet (25 mg total) by mouth daily.   montelukast (SINGULAIR) 10 MG tablet Take 1 tablet (10 mg total) by mouth daily.   Multiple Vitamins-Minerals (ALIVE MULTI-VITAMIN PO) Take by mouth daily in the afternoon.   mupirocin ointment (BACTROBAN) 2 % Apply 1 Application topically 2 (two) times daily. Prn right knee wound, left ring finger   omega-3 acid ethyl esters (LOVAZA) 1 g capsule Take 2 capsules by mouth 2 (two) times daily.   omeprazole (PRILOSEC) 40 MG capsule Take 1 capsule (40 mg total) by mouth daily.   oxyCODONE-acetaminophen (PERCOCET) 10-325 MG tablet Take 1 tablet by mouth 3 (three)  times daily as needed.   Probiotic Product (PROBIOTIC DAILY PO) Take by mouth. probiogen probiotic daily   QUEtiapine (SEROQUEL XR) 400 MG 24 hr tablet Take 400 mg by mouth daily.   scopolamine (TRANSDERM-SCOP) 1 MG/3DAYS Place 1 patch (1.5 mg total) onto the skin every 3 (three) days. 4 hours before you get on boat   topiramate (TOPAMAX) 200 MG tablet Take 200 mg by mouth daily.   valACYclovir (VALTREX) 1000 MG tablet Take 1 tablet by mouth twice daily for 7 days   vitamin B-12 (CYANOCOBALAMIN) 1000 MCG tablet Take 1,000  mcg by mouth daily.   zinc oxide 20 % ointment Apply 1 application topically 2 (two) times daily as needed for irritation.   [DISCONTINUED] mupirocin ointment (BACTROBAN) 2 % Apply 1 application topically 2 (two) times daily. Prn right knee wound   [DISCONTINUED] Semaglutide,0.25 or 0.5MG/DOS, 2 MG/3ML SOPN Inject 0.25 mg into the skin daily.   Allergies  Allergen Reactions   Cephalexin Swelling and Rash   Hctz [Hydrochlorothiazide] Rash and Swelling    Mild she says   Lisinopril Hives and Other (See Comments)    Headache, Low BP, angioedema     Losartan Hives and Swelling    Angioedema   Penicillins Rash    Has patient had a PCN reaction causing immediate rash, facial/tongue/throat swelling, SOB or lightheadedness with hypotension: Yes Has patient had a PCN reaction causing severe rash involving mucus membranes or skin necrosis: No Has patient had a PCN reaction that required hospitalization: No Has patient had a PCN reaction occurring within the last 10 years: No If all of the above answers are "NO", then may proceed with Cephalosporin use.    Bee Venom     Swelling    Metformin And Related     Rash    Recent Results (from the past 2160 hour(s))  Protein Electrophoresis, (serum)     Status: Abnormal   Collection Time: 02/11/22 11:35 AM  Result Value Ref Range   Total Protein 6.0 (L) 6.1 - 8.1 g/dL   Albumin ELP 3.6 (L) 3.8 - 4.8 g/dL   Alpha 1 0.3 0.2 - 0.3 g/dL   Alpha 2 0.7 0.5 - 0.9 g/dL   Beta Globulin 0.4 0.4 - 0.6 g/dL   Beta 2 0.4 0.2 - 0.5 g/dL   Gamma Globulin 0.8 0.8 - 1.7 g/dL   SPE Interp.      Comment: . One or more serum protein fractions are outside the normal ranges.  No abnormal protein bands are apparent. .   TSH     Status: None   Collection Time: 02/11/22 11:35 AM  Result Value Ref Range   TSH 1.49 0.35 - 5.50 uIU/mL  CBC with Differential/Platelet     Status: Abnormal   Collection Time: 02/11/22 11:35 AM  Result Value Ref Range   WBC 4.8 4.0  - 10.5 K/uL   RBC 3.88 3.87 - 5.11 Mil/uL   Hemoglobin 11.1 (L) 12.0 - 15.0 g/dL   HCT 32.4 (L) 36.0 - 46.0 %   MCV 83.4 78.0 - 100.0 fl   MCHC 34.3 30.0 - 36.0 g/dL   RDW 17.4 (H) 11.5 - 15.5 %   Platelets 184.0 150.0 - 400.0 K/uL   Neutrophils Relative % 37.7 (L) 43.0 - 77.0 %   Lymphocytes Relative 49.4 (H) 12.0 - 46.0 %   Monocytes Relative 8.4 3.0 - 12.0 %   Eosinophils Relative 3.5 0.0 - 5.0 %   Basophils Relative 1.0 0.0 -  3.0 %   Neutro Abs 1.8 1.4 - 7.7 K/uL   Lymphs Abs 2.4 0.7 - 4.0 K/uL   Monocytes Absolute 0.4 0.1 - 1.0 K/uL   Eosinophils Absolute 0.2 0.0 - 0.7 K/uL   Basophils Absolute 0.1 0.0 - 0.1 K/uL  IBC + Ferritin     Status: Abnormal   Collection Time: 02/11/22 11:35 AM  Result Value Ref Range   Iron 97 42 - 145 ug/dL   Transferrin 192.0 (L) 212.0 - 360.0 mg/dL   Saturation Ratios 36.1 20.0 - 50.0 %   Ferritin 309.3 (H) 10.0 - 291.0 ng/mL   TIBC 268.8 250.0 - 450.0 mcg/dL  Lactate dehydrogenase     Status: None   Collection Time: 02/11/22 11:35 AM  Result Value Ref Range   LDH 123 120 - 250 U/L  Protein Electrophoresis, Urine Rflx.     Status: None   Collection Time: 02/11/22 11:36 AM  Result Value Ref Range   Protein, Ur 8.8 Not Estab. mg/dL   Albumin ELP, Urine 100.0 %   Alpha-1-Globulin, U 0.0 %   Alpha-2-Globulin, U 0.0 %   Beta Globulin, U 0.0 %   Gamma Globulin, U 0.0 %   M Component, Ur Not Observed Not Observed %   Please Note: Comment     Comment: Protein electrophoresis scan will follow via computer, mail, or courier delivery.   B12 and Folate Panel     Status: Abnormal   Collection Time: 03/04/22  3:08 PM  Result Value Ref Range   Vitamin B-12 >2000 (H) 232 - 1245 pg/mL   Folate 16.7 >3.0 ng/mL    Comment: A serum folate concentration of less than 3.1 ng/mL is considered to represent clinical deficiency.   Copper, serum     Status: None   Collection Time: 03/04/22  3:08 PM  Result Value Ref Range   Copper 108 80 - 158 ug/dL     Comment:                                 Detection Limit = 5  Hemoglobinopathy evaluation     Status: None   Collection Time: 03/04/22  3:50 PM  Result Value Ref Range   Hgb F Reflexed 0.0 - 2.0 %   Hgb A Reflexed 96.4 - 98.8 %   Hgb A2 Reflexed 1.8 - 3.2 %   Hgb S Reflexed 0.0 %   Interpretation, Hgb Fract Comment     Comment: Capillary Electrophoresis (CE) performed. Reflex to High-pressure liquid chromatography (HPLC) method for confirmation.   Hgb Fractionation by HPLC     Status: Abnormal   Collection Time: 03/04/22  3:50 PM  Result Value Ref Range   Hgb F 0.0 0.0 - 2.0 %   Hgb A 59.3 (L) 96.4 - 98.8 %   Hgb A2 3.3 (H) 1.8 - 3.2 %   Hgb S 0.0 0.0 %   Hgb C 37.4 (H) 0.0 %   Hgb E 0.0 0.0 %   Hgb Variant 0.0 0.0 %   Final Interpretation: Comment     Comment: Hemoglobin pattern and concentration are consistent with Hgb C trait (heterozygous). Suggest clinical and hematologic correlation.             Hgb C-Trait Interpretation Ranges             Hgb A      50.0 - 70.0%  Hgb C      30.0 - 45.0%             Hgb A2      2.0 -  4.5%   Comprehensive metabolic panel     Status: Abnormal   Collection Time: 03/04/22  3:53 PM  Result Value Ref Range   Glucose 96 70 - 99 mg/dL   BUN 8 6 - 24 mg/dL   Creatinine, Ser 1.39 (H) 0.57 - 1.00 mg/dL   eGFR 45 (L) >59 mL/min/1.73   BUN/Creatinine Ratio 6 (L) 9 - 23   Sodium 143 134 - 144 mmol/L   Potassium 4.2 3.5 - 5.2 mmol/L   Chloride 106 96 - 106 mmol/L   CO2 23 20 - 29 mmol/L   Calcium 9.2 8.7 - 10.2 mg/dL   Total Protein 6.6 6.0 - 8.5 g/dL   Albumin 4.3 3.8 - 4.9 g/dL   Globulin, Total 2.3 1.5 - 4.5 g/dL   Albumin/Globulin Ratio 1.9 1.2 - 2.2   Bilirubin Total 0.2 0.0 - 1.2 mg/dL   Alkaline Phosphatase 77 44 - 121 IU/L   AST 13 0 - 40 IU/L   ALT 16 0 - 32 IU/L  Comprehensive metabolic panel     Status: Abnormal   Collection Time: 03/07/22 11:14 AM  Result Value Ref Range   Sodium 140 135 - 145 mEq/L   Potassium  3.9 3.5 - 5.1 mEq/L   Chloride 108 96 - 112 mEq/L   CO2 26 19 - 32 mEq/L   Glucose, Bld 100 (H) 70 - 99 mg/dL   BUN 13 6 - 23 mg/dL   Creatinine, Ser 1.21 (H) 0.40 - 1.20 mg/dL   Total Bilirubin 0.3 0.2 - 1.2 mg/dL   Alkaline Phosphatase 67 39 - 117 U/L   AST 14 0 - 37 U/L   ALT 14 0 - 35 U/L   Total Protein 5.9 (L) 6.0 - 8.3 g/dL   Albumin 3.7 3.5 - 5.2 g/dL   GFR 51.08 (L) >60.00 mL/min    Comment: Calculated using the CKD-EPI Creatinine Equation (2021)   Calcium 8.9 8.4 - 10.5 mg/dL   Objective  Body mass index is 37.02 kg/m. Wt Readings from Last 3 Encounters:  04/04/22 209 lb (94.8 kg)  03/04/22 209 lb (94.8 kg)  02/24/22 206 lb 12.8 oz (93.8 kg)   Temp Readings from Last 3 Encounters:  04/04/22 98 F (36.7 C) (Oral)  03/04/22 97.9 F (36.6 C) (Oral)  02/11/22 98.5 F (36.9 C) (Oral)   BP Readings from Last 3 Encounters:  04/04/22 130/70  03/04/22 118/71  02/24/22 124/77   Pulse Readings from Last 3 Encounters:  04/04/22 77  03/04/22 83  02/24/22 82    Physical Exam Vitals and nursing note reviewed.  Constitutional:      Appearance: Normal appearance. She is well-developed and well-groomed.  HENT:     Head: Normocephalic and atraumatic.  Eyes:     Conjunctiva/sclera: Conjunctivae normal.     Pupils: Pupils are equal, round, and reactive to light.  Cardiovascular:     Rate and Rhythm: Normal rate and regular rhythm.     Heart sounds: Normal heart sounds. No murmur heard. Pulmonary:     Effort: Pulmonary effort is normal.     Breath sounds: Normal breath sounds.  Abdominal:     General: Abdomen is flat. Bowel sounds are normal.     Tenderness: There is no abdominal tenderness.  Musculoskeletal:  General: No tenderness.  Skin:    General: Skin is warm and dry.  Neurological:     General: No focal deficit present.     Mental Status: She is alert and oriented to person, place, and time. Mental status is at baseline.     Cranial Nerves: Cranial  nerves 2-12 are intact.     Motor: Motor function is intact.     Coordination: Coordination is intact.     Gait: Gait is intact.     Comments: Walking with cane today  Psychiatric:        Attention and Perception: Attention and perception normal.        Mood and Affect: Mood and affect normal.        Speech: Speech normal.        Behavior: Behavior normal. Behavior is cooperative.        Thought Content: Thought content normal.        Cognition and Memory: Cognition and memory normal.        Judgment: Judgment normal.     Assessment  Plan  Essential hypertension controlled with comorbid conditions below- Plan: liraglutide (VICTOZA) 18 MG/3ML SOPN Toprol xl 25 mg qd  Norvasc 5 mg qd  Hyperlipidemia, unspecified hyperlipidemia type - Plan: liraglutide (VICTOZA) 18 MG/3ML SOPN  BMI 37.0-37.9, adult/class 2 severe obesity due to excess calories - Plan: liraglutide (VICTOZA) 18 MG/3ML SOPN 0.6 qd x 1 week titrate up by 0.6 weekly to goal 3 mg weekly  Dystrophy of nail due to trauma left hand ring finger  Rec bactroban and warm antibacterial soap Hold on false nails for now  Bronchiectasis without complication ([place]) - Plan: albuterol (VENTOLIN HFA) 108 (90 Base) MCG/ACT inhaler, For home use only DME Other see comment rx spirometer  Rec robitussin dm sugar free/mucinex dm  HM Flu shot utd Tdap rx per pt had 11/24/21 Pfizer 2/2 consider booster rec  Prevnar today given Consider shingrix future rx 11/24/21  Not immune MMR, hep B titer consider in the future  915/21 house calls visit A1c 6.1    h/o supracervical hysterectomy ([place]), pap 02/29/16 negative no comment HPV, another pap 06/23/17 neg pap neg HPV -h/o LEEP 2006 11/29/19 negative pap    mammo 08/04/19 negative ordered 08/22/20 negative ordered 2022  08/05/21 negative ordered    Colonoscopy 04/15/19 normal f/u in 10 years egd sch 10/23/20 Dr. Vicente Males neg bxs 10/23/20 aperistalsis and food + consider gastric emptying study and  esophageal mamometry  10/25/20 Dr. Bailey Mech  Dr Jacklynn Lewis,    Echo Dr. Nehemiah Massed -nl LV function EF 60% moderate mitral insuff and mild tricuspid insufficiency treadmill stress test neg 07/30/13  -h/o PVCS   Hep C neg 08/17/20 HIV neg 02/04/16 Rec healthy diet and exercise    Ob/gyn appt pending bladder leakage I.e bladder sling vs other [facility] in PT (stopped PT w/o help worsen sneezing or coughing)  -Dr. Georgianne Fick pt wants surgery as of 04/20/20 sx's better and no surgery indicated    Consider referral repeat sleep study h/o OSA f/u Dr. Kathyrn Sheriff and will get procedure for OSA as of 04/17/21      Renal appt 05/14/22 04/25/22 sch panniculectomy   Provider: Dr. Olivia Mackie McLean-Scocuzza-Internal Medicine

## 2022-04-04 NOTE — Patient Instructions (Addendum)
Drink 55-64 ounces water daily   Use inhaler at night with cough medication sugar free cough syrup  Use spirometer 2-3 x per day  Dr. Volanda Napoleon will be here 07/2022   Soak nail in warm water and use bactroban ointment 2x per day  I you  reach your goal weight 180lbs   Call and schedule mammogram 08/05/22   Bronchiectasis-Robitussin DM or Mucinex DM (sugar free)   Bronchiectasis is a condition in which the airways in the lungs (bronchi) are damaged and widened. The condition makes it hard for the lungs to get rid of mucus, and it causes mucus to gather in the bronchi. This condition often leads to lung infections, which can make the condition worse. What are the causes? You can be born with this condition, or you can develop it later in life. Common causes of this condition include: Cystic fibrosis. Repeated lung infections, such as pneumonia or tuberculosis. An object or other blockage in the lungs. Breathing in fluid, food, or other objects (aspiration). A problem with the body's defense system (immune system) and lung structure that is present at birth (congenital). Sometimes the cause is not known. What are the signs or symptoms? Common symptoms of this condition include: A daily cough that brings up mucus and lasts for more than 3 weeks. Lung infections that happen often. Shortness of breath and wheezing. Weakness and feeling tired (fatigue). How is this diagnosed? This condition is diagnosed with tests, such as: Chest X-rays or CT scans. These are done to check for changes in the lungs. Breathing tests. These are done to check how well your lungs are working. A test of a sample of your saliva (sputum culture). This test is done to check for infection. Blood tests and other tests. These are done to check for related diseases or causes. How is this treated? Treatment for this condition depends on the severity of the illness and its cause. Treatment may include: Medicines that  loosen mucus so it can be coughed up (mucolytics). Medicines that relax the muscles of the bronchi (bronchodilators). Antibiotic medicines to prevent or treat infection. Physical therapy to help clear mucus from the lungs. Techniques may include: Postural drainage. This is when you sit or lie in certain positions so that mucus can drain by gravity. Chest percussion. This involves tapping the chest or back with a cupped hand. Chest vibration. For this therapy, a hand or special equipment vibrates your chest and back. Surgery to remove the affected part of the lung. This may be done in severe cases. Follow these instructions at home: Medicines Take over-the-counter and prescription medicines only as told by your health care provider. If you were prescribed an antibiotic medicine, take it as told by your health care provider. Do not stop taking the antibiotic even if you start to feel better. Avoid taking sedatives and antihistamines unless your health care provider tells you to take them. These medicines tend to thicken the mucus in the lungs. Managing symptoms Do breathing exercises or techniques to clear your lungs as told by your health care provider. Consider using a cold steam vaporizer or humidifier in your room or home to help loosen secretions. If you have a cough that gets worse at night, try sleeping in a semi-upright position. General instructions Get plenty of rest. Drink enough fluid to keep your urine pale yellow. Stay inside when pollution and ozone levels are high. Stay up to date with vaccinations and immunizations. Avoid cigarette smoke and other lung irritants.  Do not use any products that contain nicotine or tobacco. These products include cigarettes, chewing tobacco, and vaping devices, such as e-cigarettes. If you need help quitting, ask your health care provider. Keep all follow-up visits. This is important. Contact a health care provider if: You cough up more sputum  than before and the sputum is yellow or green in color. You have a fever or chills. You cannot control your cough and are losing sleep. Get help right away if: You cough up blood. You have chest pain. You have increasing shortness of breath. You have pain that gets worse or is not controlled with medicines. You have a fever and your symptoms suddenly get worse. These symptoms may be an emergency. Get help right away. Call 911. Do not wait to see if the symptoms will go away. Do not drive yourself to the hospital. Summary Bronchiectasis is a condition in which the airways in the lungs (bronchi) are damaged and widened. The condition makes it hard for the lungs to get rid of mucus, and it causes mucus to gather in the bronchi. Treatment usually includes therapy to help clear mucus from the lungs. Avoid cigarette smoke and other lung irritants. Stay up to date with vaccinations and immunizations. This information is not intended to replace advice given to you by your health care provider. Make sure you discuss any questions you have with your health care provider. Document Revised: 06/05/2021 Document Reviewed: 04/23/2021 Elsevier Patient Education  [place].

## 2022-04-07 DIAGNOSIS — I890 Lymphedema, not elsewhere classified: Secondary | ICD-10-CM | POA: Diagnosis not present

## 2022-04-09 ENCOUNTER — Encounter: Payer: Self-pay | Admitting: Surgical

## 2022-04-09 DIAGNOSIS — E66813 Obesity, class 3: Secondary | ICD-10-CM

## 2022-04-09 DIAGNOSIS — Z6837 Body mass index (BMI) 37.0-37.9, adult: Secondary | ICD-10-CM | POA: Insufficient documentation

## 2022-04-09 HISTORY — DX: Morbid (severe) obesity due to excess calories: E66.01

## 2022-04-09 HISTORY — DX: Obesity, class 3: E66.813

## 2022-04-09 NOTE — Progress Notes (Signed)
-

## 2022-04-10 ENCOUNTER — Telehealth: Payer: Self-pay | Admitting: Plastic Surgery

## 2022-04-10 ENCOUNTER — Ambulatory Visit: Payer: Medicare Other | Admitting: Surgical

## 2022-04-10 DIAGNOSIS — M793 Panniculitis, unspecified: Secondary | ICD-10-CM

## 2022-04-10 NOTE — Telephone Encounter (Signed)
Left message with patient's spouse advising her to call our office as she missed her pre op appt.

## 2022-04-11 ENCOUNTER — Telehealth: Payer: Self-pay

## 2022-04-11 NOTE — Telephone Encounter (Signed)
Faxed surgery clearance to Dr. Terese Door and Dr. Thalia Bloodgood

## 2022-04-12 NOTE — Telephone Encounter (Signed)
Called patient to inform her of the surgeon's departure from the practice.  Options given and patient decided to stay with the surgery for 7/21 and have me do the surgery. Called the [facility] and switched the surgery to 2:30 pm. Patient will come to the clinic 7/18 to meet me at 9:15 am.

## 2022-04-14 ENCOUNTER — Ambulatory Visit (INDEPENDENT_AMBULATORY_CARE_PROVIDER_SITE_OTHER): Payer: Self-pay | Admitting: Physician Assistant

## 2022-04-14 VITALS — BP 118/73 | HR 92 | Ht 63.0 in | Wt 217.4 lb

## 2022-04-14 DIAGNOSIS — M793 Panniculitis, unspecified: Secondary | ICD-10-CM

## 2022-04-14 MED ORDER — ONDANSETRON 4 MG PO TBDP: 4.0000 mg | ORAL_TABLET | Freq: Three times a day (TID) | ORAL | 0 refills | Status: DC | PRN

## 2022-04-14 MED ORDER — CLINDAMYCIN HCL 150 MG PO CAPS: 450.0000 mg | ORAL_CAPSULE | Freq: Three times a day (TID) | ORAL | 0 refills | Status: AC

## 2022-04-14 NOTE — Addendum Note (Signed)
Addended by: Krista Blue on: 04/14/2022 03:36 PM   Modules accepted: Orders

## 2022-04-14 NOTE — Progress Notes (Addendum)
Patient ID: Heather Sweeney, female    DOB: 07-Jul-1966, 56 y.o.   MRN: [id number]  Chief Complaint  Patient presents with   Pre-op Exam      ICD-10-CM   1. Panniculitis  M79.3        History of Present Illness: Heather Sweeney is a 56 y.o.  female  with a history of panniculitis.  She presents for preoperative evaluation for upcoming procedure, panniculectomy, scheduled for 04/24/2022 with Dr. Marla Roe.  The patient has not had problems with anesthesia.  She is on Victoza for weight loss.  She will need to hold this for 1 week prior to surgery.  She also states that she is on Plaquenil, but cannot tell me why.  This will need to be held for 2 weeks before and after surgery.  We will send a message to her primary care provider requesting clearance.  Informed patient that she will need to hold her Celebrex or any other NSAIDs 3 days before surgery.  She will also need to hold her multivitamins and supplements 1 week before surgery.  Denies any personal or family history of blood clots or clotting disorder.  Denies any personal history of MI, CVA, or cancer.  While she had been seen for lymphedema recently by vascular surgery, no appreciable leg swelling on exam.  She tells me that she never smoked cigarettes, but rather would use chewable tobacco.  She has held for the past 2-3 weeks and has no plans on resuming prior to surgery.  While she is scheduled for a infraumbilical panniculectomy only, she tells me that she is still also interested in receiving a quote for upper abdominal plication/liposuction.  She understands that this would be cosmetic and not covered by insurance.  This would also mean cancellation of her surgery and have it be rescheduled at [facility] [address] for later date.  While she thinks she is agreeable with just the infraumbilical panniculectomy, she would at least like to entertain the cosmetic quote.  Summary of Previous Visit: She was seen for consult by Dr. Claudia Desanctis 01/29/2022.  At that  time, complained of overhanging skin and rashes in abdominal crease.  She also expressed that she is bothered by contour in the supraumbilical area.  Discussed infraumbilical panniculectomy combined with upper abdominal liposuction and plication.  She understood that the plan would be cosmetic and could be deferred if she would prefer to focus on the infraumbilical aspect of the surgery.  This was ultimately the case as she is currently scheduled for strictly a panniculectomy.  Job: Disability for back pain.  PMH Significant for: Prior hysterectomy, tobacco use disorder, iron deficiency anemia, HTN, HLD, bronchiectasis, chronic pain syndrome on Percocet 10-325 TID, BLE lymphedema, OSA on CPAP.    Past Medical History: Allergies: Allergies  Allergen Reactions   Cephalexin Swelling and Rash   Hctz [Hydrochlorothiazide] Rash and Swelling    Mild she says   Lisinopril Hives and Other (See Comments)    Headache, Low BP, angioedema     Losartan Hives and Swelling    Angioedema   Penicillins Rash    Has patient had a PCN reaction causing immediate rash, facial/tongue/throat swelling, SOB or lightheadedness with hypotension: Yes Has patient had a PCN reaction causing severe rash involving mucus membranes or skin necrosis: No Has patient had a PCN reaction that required hospitalization: No Has patient had a PCN reaction occurring within the last 10 years: No If all of the above answers are "NO",  then may proceed with Cephalosporin use.    Bee Venom     Swelling    Metformin And Related     Rash     Current Medications:  Current Outpatient Medications:    ACCU-CHEK GUIDE test strip, Use as instructed, Disp: 100 strip, Rfl: 0   Accu-Chek Softclix Lancets lancets, Use as instructed, Disp: 100 each, Rfl: 0   albuterol (VENTOLIN HFA) 108 (90 Base) MCG/ACT inhaler, Inhale 1-2 puffs into the lungs every 6 (six) hours as needed for wheezing or shortness of breath., Disp: 18 g, Rfl: 11    Alcohol Swabs (ALCOHOL PADS) 70 % PADS, Use as directed,once daily to test blood sugar, Disp: 100 each, Rfl: 3   amLODipine (NORVASC) 5 MG tablet, Take 1 tablet (5 mg total) by mouth daily., Disp: 90 tablet, Rfl: 3   benzonatate (TESSALON) 200 MG capsule, Take 1 capsule (200 mg total) by mouth 3 (three) times daily as needed for cough., Disp: 40 capsule, Rfl: 0   benztropine (COGENTIN) 1 MG tablet, Take 1 mg by mouth 2 (two) times daily., Disp: , Rfl:    Biotin w/ Vitamins C & E (HAIR/SKIN/NAILS PO), Take 2,500 mcg by mouth daily., Disp: , Rfl:    Blood Glucose Calibration (TRUE METRIX LEVEL 1) Low SOLN, , Disp: , Rfl:    Blood Glucose Monitoring Suppl (ACCU-CHEK GUIDE ME) w/Device KIT, Use as directed,once daily to test blood sugar, Disp: 1 kit, Rfl: 0   buPROPion (WELLBUTRIN XL) 150 MG 24 hr tablet, Take 150 mg by mouth every morning., Disp: , Rfl:    celecoxib (CELEBREX) 200 MG capsule, Take 1 capsule by mouth 2 (two) times daily as needed., Disp: , Rfl:    cetirizine (ZYRTEC ALLERGY) 10 MG tablet, Take 2 tablets (20 mg total) by mouth daily., Disp: 180 tablet, Rfl: 3   clobetasol cream (TEMOVATE) 4.19 %, Apply 1 application topically 2 (two) times daily., Disp: , Rfl:    clotrimazole (CLOTRIMAZOLE AF) 1 % cream, Apply 1 application topically 2 (two) times daily., Disp: 60 g, Rfl: 2   Dextromethorphan-Guaifenesin 60-1200 MG 12hr tablet, Take 1 tablet by mouth every 12 (twelve) hours. Prn cough Destanie Tibbetts label, Disp: 30 tablet, Rfl: 0   EASY COMFORT PEN NEEDLES 31G X 8 MM MISC, INJECT VICTOZA ONCE DAILY AS DIRECTED, Disp: 100 each, Rfl: 3   EPINEPHrine 0.3 mg/0.3 mL IJ SOAJ injection, Inject 0.3 mg into the muscle as needed for anaphylaxis., Disp: 2 each, Rfl: 3   famotidine (PEPCID) 40 MG tablet, Take 1 tablet (40 mg total) by mouth daily., Disp: 90 tablet, Rfl: 3   fexofenadine (ALLEGRA) 180 MG tablet, Take 2 tablets (360 mg total) by mouth every morning. Prn only if needed either take zyrtec or  allegra explain to patient, Disp: 180 tablet, Rfl: 3   FIBER ADULT GUMMIES PO, Take by mouth., Disp: , Rfl:    FLUoxetine (PROZAC) 40 MG capsule, Take 40 mg by mouth daily., Disp: , Rfl:    fluPHENAZine (PROLIXIN) 2.5 MG tablet, Take 2.5 mg by mouth 2 (two) times daily., Disp: , Rfl:    fluticasone (FLONASE) 50 MCG/ACT nasal spray, Place 2 sprays into both nostrils daily., Disp: 48 g, Rfl: 3   furosemide (LASIX) 20 MG tablet, Take 0.5-1 tablets (10-20 mg total) by mouth daily as needed., Disp: 90 tablet, Rfl: 1   hydrocortisone 2.5 % ointment, Apply topically 2 (two) times daily. Neck, Disp: 30 g, Rfl: 5   hydroxychloroquine (PLAQUENIL) 200 MG  tablet, Take 200 mg by mouth 2 (two) times daily., Disp: , Rfl:    Lancet Devices (EASY MINI EJECT LANCING DEVICE) MISC, Use as directed,once daily to test blood sugar, Disp: 1 each, Rfl: 3   liraglutide (VICTOZA) 18 MG/3ML SOPN, Inject 0.6 mg into the skin daily. X 1 week, 1.2 qd x week 2, 1.8 qd x week 3, 2.4 qd x week 4 and 3 mg qd x week 5 and beyond goal wt 180s, Disp: 3 mL, Rfl: 11   M-DRYL 12.5 MG/5ML liquid, Take 5 mLs by mouth., Disp: , Rfl:    Melatonin 10 MG TABS, Take by mouth at bedtime., Disp: , Rfl:    meloxicam (MOBIC) 15 MG tablet, Take 15 mg by mouth daily., Disp: , Rfl:    metoprolol succinate (TOPROL-XL) 25 MG 24 hr tablet, Take 1 tablet (25 mg total) by mouth daily., Disp: 90 tablet, Rfl: 3   montelukast (SINGULAIR) 10 MG tablet, Take 1 tablet (10 mg total) by mouth daily., Disp: 90 tablet, Rfl: 3   Multiple Vitamins-Minerals (ALIVE MULTI-VITAMIN PO), Take by mouth daily in the afternoon., Disp: , Rfl:    mupirocin ointment (BACTROBAN) 2 %, Apply 1 Application topically 2 (two) times daily. Prn right knee wound, left ring finger, Disp: 30 g, Rfl: 2   omega-3 acid ethyl esters (LOVAZA) 1 g capsule, Take 2 capsules by mouth 2 (two) times daily., Disp: , Rfl:    omeprazole (PRILOSEC) 40 MG capsule, Take 1 capsule (40 mg total) by mouth  daily., Disp: 90 capsule, Rfl: 3   oxyCODONE-acetaminophen (PERCOCET) 10-325 MG tablet, Take 1 tablet by mouth 3 (three) times daily as needed., Disp: , Rfl:    Probiotic Product (PROBIOTIC DAILY PO), Take by mouth. probiogen probiotic daily, Disp: , Rfl:    QUEtiapine (SEROQUEL XR) 400 MG 24 hr tablet, Take 400 mg by mouth daily., Disp: , Rfl:    scopolamine (TRANSDERM-SCOP) 1 MG/3DAYS, Place 1 patch (1.5 mg total) onto the skin every 3 (three) days. 4 hours before you get on boat, Disp: 4 patch, Rfl: 0   topiramate (TOPAMAX) 200 MG tablet, Take 200 mg by mouth daily., Disp: , Rfl:    valACYclovir (VALTREX) 1000 MG tablet, Take 1 tablet by mouth twice daily for 7 days, Disp: 90 tablet, Rfl: 0   vitamin B-12 (CYANOCOBALAMIN) 1000 MCG tablet, Take 1,000 mcg by mouth daily., Disp: , Rfl:    zinc oxide 20 % ointment, Apply 1 application topically 2 (two) times daily as needed for irritation., Disp: 60 g, Rfl: 2   omalizumab (XOLAIR) 150 MG/ML prefilled syringe, Inject 300 mg into the skin every 28 (twenty-eight) days., Disp: , Rfl:   Past Medical Problems: Past Medical History:  Diagnosis Date   Allergic rhinitis    Arthritis of knee, right    Asthma    Bipolar disorder (HCC)    Chronic low back pain    CTS (carpal tunnel syndrome)    hands   Depression    Gallstones    GERD (gastroesophageal reflux disease)    History of prediabetes    Hypertension    Laceration of right foot    Onychomycosis    OSA on CPAP    Overactive bladder    PVC's (premature ventricular contractions)    PVC's (premature ventricular contractions)    Dr. Raliegh Ip cards   Seizures [facility])    8th grade    Past Surgical History: Past Surgical History:  Procedure Laterality Date  ABDOMINAL HYSTERECTOMY  07/08/2011   ABDOMINAL HYSTERECTOMY     supracervical in 07/2011    CARPAL TUNNEL RELEASE     right hand 1996   CESAREAN SECTION     2012   CHOLECYSTECTOMY N/A 01/12/2018   Procedure: LAPAROSCOPIC CHOLECYSTECTOMY  WITH INTRAOPERATIVE CHOLANGIOGRAM ERAS PATHWAY;  Surgeon: Johnathan Hausen, MD;  Location: [facility];  Service: General;  Laterality: N/A;   COLONOSCOPY WITH PROPOFOL N/A 04/15/2019   Procedure: COLONOSCOPY WITH PROPOFOL;  Surgeon: Jonathon Bellows, MD;  Location: [facility] ENDOSCOPY;  Service: Gastroenterology;  Laterality: N/A;   DILATION AND CURETTAGE OF UTERUS     04/2011 benign endometrial polyp squamous metaplasia, inflamm, blood, mucous, benign    ESOPHAGOGASTRODUODENOSCOPY (EGD) WITH PROPOFOL N/A 10/23/2020   Procedure: ESOPHAGOGASTRODUODENOSCOPY (EGD) WITH PROPOFOL;  Surgeon: Jonathon Bellows, MD;  Location: [facility] ENDOSCOPY;  Service: Gastroenterology;  Laterality: N/A;   FLEXIBLE SIGMOIDOSCOPY N/A 02/15/2021   Procedure: FLEXIBLE SIGMOIDOSCOPY;  Surgeon: Jonathon Bellows, MD;  Location: [facility] ENDOSCOPY;  Service: Gastroenterology;  Laterality: N/A;   FOOT SURGERY     Dr. Prudence Davidson    LEEP     2006   TUBAL LIGATION      Social History: Social History   Socioeconomic History   Marital status: Married    Spouse name: Not on file   Number of children: Not on file   Years of education: Not on file   Highest education level: Not on file  Occupational History   Not on file  Tobacco Use   Smoking status: Never   Smokeless tobacco: Current    Types: Snuff   Tobacco comments:    Is in process of decreasing amount  Vaping Use   Vaping Use: Never used  Substance and Sexual Activity   Alcohol use: No    Alcohol/week: 0.0 standard drinks of alcohol   Drug use: No   Sexual activity: Not Currently    Birth control/protection: Surgical    Comment: Hysterectomy  Other Topics Concern   Not on file  Social History Narrative   Married    No guns    Wears seat belt    Safe in relationship    Never smoker    2 sons Gaspar Bidding ~10 and another son ~28 as of 10/2019   1 granson eli 1 y.o as of 10/2019   2 sisters    Unemployed       Social Determinants of Health   Financial Resource Strain: Low Risk  (09/13/2021)    Overall Financial Resource Strain (CARDIA)    Difficulty of Paying Living Expenses: Not hard at all  Food Insecurity: No Food Insecurity (02/25/2021)   Hunger Vital Sign    Worried About Running Out of Food in the Last Year: Never true    Ran Out of Food in the Last Year: Never true  Transportation Needs: No Transportation Needs (02/25/2021)   PRAPARE - Hydrologist (Medical): No    Lack of Transportation (Non-Medical): No  Physical Activity: Insufficiently Active (07/07/2019)   Exercise Vital Sign    Days of Exercise per Week: 3 days    Minutes of Exercise per Session: 30 min  Stress: No Stress Concern Present (10/01/2020)   [place]    Feeling of Stress : Not at all  Social Connections: Unknown (10/01/2020)   Social Connection and Isolation Panel [NHANES]    Frequency of Communication with Friends and Family: Not on file  Frequency of Social Gatherings with Friends and Family: Not on file    Attends Religious Services: Not on file    Active Member of Clubs or Organizations: Not on file    Attends Archivist Meetings: Not on file    Marital Status: Married  Intimate Partner Violence: Not At Risk (02/25/2021)   Humiliation, Afraid, Rape, and Kick questionnaire    Fear of Current or Ex-Partner: No    Emotionally Abused: No    Physically Abused: No    Sexually Abused: No    Family History: Family History  Problem Relation Age of Onset   Heart failure Mother    Hypertension Mother    Early death Mother    Heart disease Mother        chf   Cancer Father        esophageal    Hypertension Father    Breast cancer Sister 6   Cancer Sister        breast   Depression Sister    Mental illness Sister    Asthma Son    Cancer Other        brain cancer   Cancer Paternal Aunt        brain cancer    Review of Systems: ROS Denies any recent chest pain, difficulty breathing,  fevers.  Physical Exam: Vital Signs BP 118/73 (BP Location: Left Arm, Patient Position: Sitting, Cuff Size: Large)   Pulse 92   Ht '5\' 3"'  (1.6 m)   Wt 217 lb 6.4 oz (98.6 kg)   SpO2 98%   BMI 38.51 kg/m   Physical Exam Constitutional:      General: Not in acute distress.    Appearance: Normal appearance. Not ill-appearing.  HENT:     Head: Normocephalic and atraumatic.  Eyes:     Pupils: Pupils are equal, round. Cardiovascular:     Rate and Rhythm: Normal rate.    Pulses: Normal pulses.  Pulmonary:     Effort: No respiratory distress or increased work of breathing.  Speaks in full sentences. Abdominal:     General: Abdomen is flat. No distension.   Musculoskeletal: Normal range of motion. No lower extremity swelling or edema. No varicosities. Skin:    General: Skin is warm and dry.     Findings: No erythema or rash.  Neurological:     Mental Status: Alert and oriented to person, place, and time.  Psychiatric:        Mood and Affect: Mood normal.        Behavior: Behavior normal.    Assessment/Plan: The patient is scheduled for panniculectomy with Dr. Marla Roe.  Risks, benefits, and alternatives of procedure discussed, questions answered and consent obtained.    Smoking Status: Previous user of chewable tobacco, quit 3 weeks ago.  Informed patient that any nicotine-containing products increase her risks of postoperative wounds, infections, and other potential complications.  She does not plan to resume.  Caprini Score: 5; Risk Factors include: Age, BMI greater than 25, and length of planned surgery. Recommendation for mechanical prophylaxis. Encourage early ambulation.   Pictures obtained: 01/29/2022.  Post-op Rx sent to pharmacy: Clindamycin and Zofran.  She is already taking Percocet 10-325 mg TID per pain management doctor.  Will defer to them.    Patient was provided with the General Surgical Risk consent document and Pain Medication Agreement prior to their  appointment.  They had adequate time to read through the risk consent documents and Pain Medication Agreement.  We also discussed them in person together during this preop appointment. All of their questions were answered to their satisfaction.  Recommended calling if they have any further questions.  Risk consent form and Pain Medication Agreement to be scanned into patient's chart.  The risk that can be encountered for this procedure were discussed and include the following but not limited to these: asymmetry, fluid accumulation, firmness of the tissue, skin loss, decrease or no sensation, fat necrosis, bleeding, infection, healing delay.  Deep vein thrombosis, cardiac and pulmonary complications are risks to any procedure.  There are risks of anesthesia, changes to skin sensation and injury to nerves or blood vessels.  The muscle can be temporarily or permanently injured.  You may have an allergic reaction to tape, suture, glue, blood products which can result in skin discoloration, swelling, pain, skin lesions, poor healing.  Any of these can lead to the need for revisonal surgery or stage procedures.  Weight gain and weigh loss can also effect the long term appearance. The results are not guaranteed to last a lifetime.  Future surgery may be required.      Electronically signed by: Krista Blue, PA-C 04/14/2022 3:35 PM

## 2022-04-16 ENCOUNTER — Encounter: Payer: Medicare Other | Admitting: Surgical

## 2022-04-17 ENCOUNTER — Telehealth: Payer: Self-pay | Admitting: *Deleted

## 2022-04-17 ENCOUNTER — Encounter: Payer: Self-pay | Admitting: *Deleted

## 2022-04-17 NOTE — Telephone Encounter (Signed)
Attempt to call pt to discuss quote she requested for cosmetic add-on upper abdomen to paniculectomy surgery. No answer/unable to leave voicemail

## 2022-04-21 NOTE — Progress Notes (Signed)
Surgical Instructions    Your procedure is scheduled on Thursday, July 20th, 2023.   Report to [facility] Main Entrance "A" at 11:45 A.M., then check in with the Admitting office.  Call this number if you have problems the morning of surgery:  [phone]   If you have any questions prior to your surgery date call [phone]: Open Monday-Friday 8am-4pm    Remember:  Do not eat after midnight the night before your surgery  You may drink clear liquids until 10:45 the morning of your surgery.   Clear liquids allowed are: Water, Non-Citrus Juices (without pulp), Carbonated Beverages, Clear Tea, Black Coffee ONLY (NO MILK, CREAM OR POWDERED CREAMER of any kind), and Gatorade    Take these medicines the morning of surgery with A SIP OF WATER:   amLODipine (NORVASC) benztropine (COGENTIN) buPROPion (WELLBUTRIN XL) cetirizine (ZYRTEC ALLERGY) famotidine (PEPCID) FLUoxetine (PROZAC) fluPHENAZine (PROLIXIN)  fluticasone (FLONASE) metoprolol succinate (TOPROL-XL) montelukast (SINGULAIR) omeprazole (PRILOSEC)   QUEtiapine (SEROQUEL XR) topiramate (TOPAMAX) valACYclovir (VALTREX)  If needed:  albuterol (VENTOLIN HFA) benzonatate (TESSALON) Dextromethorphan-Guaifenesin  EPINEPHrine fexofenadine (ALLEGRA) ondansetron (ZOFRAN-ODT) oxyCODONE-acetaminophen (PERCOCET)   Ask MD if you must hold hydroxychloroquine (PLAQUENIL) prior your surgery.  Do not take liraglutide (VICTOZA) the day of surgery.  As of today, STOP taking any Aspirin (unless otherwise instructed by your surgeon) Aleve, Naproxen, Ibuprofen, Motrin, Advil, Mobic, Goody's, BC's, all herbal medications, fish oil, and all vitamins.  Please bring all inhalers with you the day of surgery.      The day of surgery:          Do not wear jewelry or makeup Do not wear lotions, powders, perfumes, or deodorant. Do not shave 48 hours prior to surgery.   Do not bring valuables to the hospital. Do not wear nail polish,  gel polish, artificial nails, or any other type of covering on natural nails (fingers and toes) If you have artificial nails or gel coating that need to be removed by a nail salon, please have this removed prior to surgery. Artificial nails or gel coating may interfere with anesthesia's ability to adequately monitor your vital signs.  [facility] is not responsible for any belongings or valuables. .   Do NOT Smoke (Tobacco/Vaping)  24 hours prior to your procedure  If you use a CPAP at night, you may bring your mask for your overnight stay.   Contacts, glasses, hearing aids, dentures or partials may not be worn into surgery, please bring cases for these belongings   For patients admitted to the hospital, discharge time will be determined by your treatment team.   Patients discharged the day of surgery will not be allowed to drive home, and someone needs to stay with them for 24 hours.   SURGICAL WAITING ROOM VISITATION Patients having surgery or a procedure may have no more than 2 support people in the waiting area - these visitors may rotate.   Children under the age of 74 must have an adult with them who is not the patient. If the patient needs to stay at the hospital during part of their recovery, the visitor guidelines for inpatient rooms apply. Pre-op nurse will coordinate an appropriate time for 1 support person to accompany patient in pre-op.  This support person may not rotate.   Please refer to the [facility] website for the visitor guidelines for Inpatients (after your surgery is over and you are in a regular room).    Special instructions:    Oral Hygiene is  also important to reduce your risk of infection.  Remember - BRUSH YOUR TEETH THE MORNING OF SURGERY WITH YOUR REGULAR TOOTHPASTE   Heather Sweeney- Preparing For Surgery  Before surgery, you can play an important role. Because skin is not sterile, your skin needs to be as free of germs as possible. You can reduce the  number of germs on your skin by washing with CHG (chlorahexidine gluconate) Soap before surgery.  CHG is an antiseptic cleaner which kills germs and bonds with the skin to continue killing germs even after washing.     Please do not use if you have an allergy to CHG or antibacterial soaps. If your skin becomes reddened/irritated stop using the CHG.  Do not shave (including legs and underarms) for at least 48 hours prior to first CHG shower. It is OK to shave your face.  Please follow these instructions carefully.     Shower the NIGHT BEFORE SURGERY and the MORNING OF SURGERY with CHG Soap.   If you chose to wash your hair, wash your hair first as usual with your normal shampoo. After you shampoo, rinse your hair and body thoroughly to remove the shampoo.  Then [employer] and genitals (private parts) with your normal soap and rinse thoroughly to remove soap.  After that Use CHG Soap as you would any other liquid soap. You can apply CHG directly to the skin and wash gently with a scrungie or a clean washcloth.   Apply the CHG Soap to your body ONLY FROM THE NECK DOWN.  Do not use on open wounds or open sores. Avoid contact with your eyes, ears, mouth and genitals (private parts). Wash Face and genitals (private parts)  with your normal soap.   Wash thoroughly, paying special attention to the area where your surgery will be performed.  Thoroughly rinse your body with warm water from the neck down.  DO NOT shower/wash with your normal soap after using and rinsing off the CHG Soap.  Pat yourself dry with a CLEAN TOWEL.  Wear CLEAN PAJAMAS to bed the night before surgery  Place CLEAN SHEETS on your bed the night before your surgery  DO NOT SLEEP WITH PETS.   Day of Surgery:  Take a shower with CHG soap. Wear Clean/Comfortable clothing the morning of surgery Do not apply any deodorants/lotions.   Remember to brush your teeth WITH YOUR REGULAR TOOTHPASTE.    If you received a COVID  test during your pre-op visit, it is requested that you wear a mask when out in public, stay away from anyone that may not be feeling well, and notify your surgeon if you develop symptoms. If you have been in contact with anyone that has tested positive in the last 10 days, please notify your surgeon.    Please read over the following fact sheets that you were given.

## 2022-04-22 ENCOUNTER — Encounter: Payer: Self-pay | Admitting: Plastic Surgery

## 2022-04-22 ENCOUNTER — Inpatient Hospital Stay (HOSPITAL_COMMUNITY)
Admission: RE | Admit: 2022-04-22 | Discharge: 2022-04-22 | Disposition: A | Discharge status: Home / Self Care | Payer: Medicare Other | Source: Ambulatory Visit

## 2022-04-22 ENCOUNTER — Ambulatory Visit (INDEPENDENT_AMBULATORY_CARE_PROVIDER_SITE_OTHER): Payer: Self-pay | Admitting: Plastic Surgery

## 2022-04-22 ENCOUNTER — Telehealth: Payer: Self-pay

## 2022-04-22 DIAGNOSIS — I340 Nonrheumatic mitral (valve) insufficiency: Secondary | ICD-10-CM

## 2022-04-22 DIAGNOSIS — R7303 Prediabetes: Secondary | ICD-10-CM

## 2022-04-22 DIAGNOSIS — Z6837 Body mass index (BMI) 37.0-37.9, adult: Secondary | ICD-10-CM

## 2022-04-22 DIAGNOSIS — M793 Panniculitis, unspecified: Secondary | ICD-10-CM | POA: Insufficient documentation

## 2022-04-22 DIAGNOSIS — M170 Bilateral primary osteoarthritis of knee: Secondary | ICD-10-CM

## 2022-04-22 DIAGNOSIS — L304 Erythema intertrigo: Secondary | ICD-10-CM

## 2022-04-22 DIAGNOSIS — M5417 Radiculopathy, lumbosacral region: Secondary | ICD-10-CM

## 2022-04-22 NOTE — Telephone Encounter (Signed)
Heather Sweeney with [facility] left voicemail yesterday needing clarification if authorization for SX (July 20 @ [facility] Main) needs to be submitted as outpatient or inpatient. They have a [facility] authorization for outpatient SX but the SX is scheduled as an inpatient Px. She would like a call back so that an authorization can be submitted if it needs to be changed.   Pt had a f/u appt with Dr. Marla Roe today.   Thanks!

## 2022-04-22 NOTE — H&P (View-Only) (Signed)
Patient ID: Heather Sweeney, female    DOB: March 30, 1966, 56 y.o.   MRN: [id number]   Chief Complaint  Patient presents with   Advice Only    The patient is a 56 year old female here for meeting for discussion about a panniculectomy.  She has complaints of rashes over in her.  She has had a hysterectomy foot surgery in the past.  She has prediabetes, hyperlipidemia and hypertension.  She is 5 feet 3 inches tall and weighs 205 pounds.    Review of Systems  Constitutional: Negative.   Eyes: Negative.   Respiratory: Negative.  Negative for shortness of breath.   Cardiovascular:  Positive for leg swelling.  Gastrointestinal: Negative.   Endocrine: Negative.   Musculoskeletal:  Positive for back pain and neck pain.  Skin:  Positive for rash.    Past Medical History:  Diagnosis Date   Allergic rhinitis    Arthritis of knee, right    Asthma    Bipolar disorder ([place])    Chronic low back pain    CTS (carpal tunnel syndrome)    hands   Depression    Gallstones    GERD (gastroesophageal reflux disease)    History of prediabetes    Hypertension    Laceration of right foot    Onychomycosis    OSA on CPAP    Overactive bladder    PVC's (premature ventricular contractions)    PVC's (premature ventricular contractions)    Dr. Raliegh Ip cards   Seizures [facility])    8th grade    Past Surgical History:  Procedure Laterality Date   ABDOMINAL HYSTERECTOMY  07/08/2011   ABDOMINAL HYSTERECTOMY     supracervical in 07/2011    [place]     right hand [place]     2012   CHOLECYSTECTOMY N/A 01/12/2018   Procedure: LAPAROSCOPIC CHOLECYSTECTOMY WITH INTRAOPERATIVE CHOLANGIOGRAM ERAS PATHWAY;  Surgeon: Johnathan Hausen, MD;  Location: [facility] ORS;  Service: General;  Laterality: N/A;   COLONOSCOPY WITH PROPOFOL N/A 04/15/2019   Procedure: COLONOSCOPY WITH PROPOFOL;  Surgeon: Jonathon Bellows, MD;  Location: [facility] ENDOSCOPY;  Service: Gastroenterology;  Laterality: N/A;   DILATION  AND CURETTAGE OF UTERUS     04/2011 benign endometrial polyp squamous metaplasia, inflamm, blood, mucous, benign    ESOPHAGOGASTRODUODENOSCOPY (EGD) WITH PROPOFOL N/A 10/23/2020   Procedure: ESOPHAGOGASTRODUODENOSCOPY (EGD) WITH PROPOFOL;  Surgeon: Jonathon Bellows, MD;  Location: [facility] ENDOSCOPY;  Service: Gastroenterology;  Laterality: N/A;   FLEXIBLE SIGMOIDOSCOPY N/A 02/15/2021   Procedure: FLEXIBLE SIGMOIDOSCOPY;  Surgeon: Jonathon Bellows, MD;  Location[facility] ENDOSCOPY;  Service: Gastroenterology;  Laterality: N/A;   FOOT SURGERY     Dr. Prudence Davidson    LEEP     2006   TUBAL LIGATION        Current Outpatient Medications:    ACCU-CHEK GUIDE test strip, Use as instructed, Disp: 100 strip, Rfl: 0   Accu-Chek Softclix Lancets lancets, Use as instructed, Disp: 100 each, Rfl: 0   albuterol (VENTOLIN HFA) 108 (90 Base) MCG/ACT inhaler, Inhale 1-2 puffs into the lungs every 6 (six) hours as needed for wheezing or shortness of breath., Disp: 18 g, Rfl: 11   Alcohol Swabs (ALCOHOL PADS) 70 % PADS, Use as directed,once daily to test blood sugar, Disp: 100 each, Rfl: 3   amLODipine (NORVASC) 5 MG tablet, Take 1 tablet (5 mg total) by mouth daily., Disp: 90 tablet, Rfl: 3   benzonatate (TESSALON) 200 MG capsule, Take 1 capsule (  200 mg total) by mouth 3 (three) times daily as needed for cough., Disp: 40 capsule, Rfl: 0   benztropine (COGENTIN) 1 MG tablet, Take 1 mg by mouth 2 (two) times daily., Disp: , Rfl:    Biotin w/ Vitamins C & E (HAIR/SKIN/NAILS PO), Take 2,500 mcg by mouth daily., Disp: , Rfl:    Blood Glucose Calibration (TRUE METRIX LEVEL 1) Low SOLN, , Disp: , Rfl:    Blood Glucose Monitoring Suppl (ACCU-CHEK GUIDE ME) w/Device KIT, Use as directed,once daily to test blood sugar, Disp: 1 kit, Rfl: 0   buPROPion (WELLBUTRIN XL) 150 MG 24 hr tablet, Take 150 mg by mouth every morning., Disp: , Rfl:    celecoxib (CELEBREX) 200 MG capsule, Take 1 capsule by mouth 2 (two) times daily as needed., Disp: , Rfl:     cetirizine (ZYRTEC ALLERGY) 10 MG tablet, Take 2 tablets (20 mg total) by mouth daily., Disp: 180 tablet, Rfl: 3   clobetasol cream (TEMOVATE) 0.26 %, Apply 1 application topically 2 (two) times daily., Disp: , Rfl:    clotrimazole (CLOTRIMAZOLE AF) 1 % cream, Apply 1 application topically 2 (two) times daily., Disp: 60 g, Rfl: 2   Dextromethorphan-Guaifenesin 60-1200 MG 12hr tablet, Take 1 tablet by mouth every 12 (twelve) hours. Prn cough green label, Disp: 30 tablet, Rfl: 0   EASY COMFORT PEN NEEDLES 31G X 8 MM MISC, INJECT VICTOZA ONCE DAILY AS DIRECTED, Disp: 100 each, Rfl: 3   EPINEPHrine 0.3 mg/0.3 mL IJ SOAJ injection, Inject 0.3 mg into the muscle as needed for anaphylaxis., Disp: 2 each, Rfl: 3   famotidine (PEPCID) 40 MG tablet, Take 1 tablet (40 mg total) by mouth daily., Disp: 90 tablet, Rfl: 3   fexofenadine (ALLEGRA) 180 MG tablet, Take 2 tablets (360 mg total) by mouth every morning. Prn only if needed either take zyrtec or allegra explain to patient, Disp: 180 tablet, Rfl: 3   FIBER ADULT GUMMIES PO, Take by mouth., Disp: , Rfl:    FLUoxetine (PROZAC) 40 MG capsule, Take 40 mg by mouth daily., Disp: , Rfl:    fluPHENAZine (PROLIXIN) 2.5 MG tablet, Take 2.5 mg by mouth 2 (two) times daily., Disp: , Rfl:    fluticasone (FLONASE) 50 MCG/ACT nasal spray, Place 2 sprays into both nostrils daily., Disp: 48 g, Rfl: 3   furosemide (LASIX) 20 MG tablet, Take 0.5-1 tablets (10-20 mg total) by mouth daily as needed., Disp: 90 tablet, Rfl: 1   hydrocortisone 2.5 % ointment, Apply topically 2 (two) times daily. Neck, Disp: 30 g, Rfl: 5   hydroxychloroquine (PLAQUENIL) 200 MG tablet, Take 200 mg by mouth 2 (two) times daily., Disp: , Rfl:    Lancet Devices (EASY MINI EJECT LANCING DEVICE) MISC, Use as directed,once daily to test blood sugar, Disp: 1 each, Rfl: 3   liraglutide (VICTOZA) 18 MG/3ML SOPN, Inject 0.6 mg into the skin daily. X 1 week, 1.2 qd x week 2, 1.8 qd x week 3, 2.4 qd x week 4  and 3 mg qd x week 5 and beyond goal wt 180s, Disp: 3 mL, Rfl: 11   M-DRYL 12.5 MG/5ML liquid, Take 5 mLs by mouth., Disp: , Rfl:    Melatonin 10 MG TABS, Take by mouth at bedtime., Disp: , Rfl:    meloxicam (MOBIC) 15 MG tablet, Take 15 mg by mouth daily., Disp: , Rfl:    metoprolol succinate (TOPROL-XL) 25 MG 24 hr tablet, Take 1 tablet (25 mg total) by mouth daily.,  Disp: 90 tablet, Rfl: 3   montelukast (SINGULAIR) 10 MG tablet, Take 1 tablet (10 mg total) by mouth daily., Disp: 90 tablet, Rfl: 3   Multiple Vitamins-Minerals (ALIVE MULTI-VITAMIN PO), Take by mouth daily in the afternoon., Disp: , Rfl:    mupirocin ointment (BACTROBAN) 2 %, Apply 1 Application topically 2 (two) times daily. Prn right knee wound, left ring finger, Disp: 30 g, Rfl: 2   omalizumab (XOLAIR) 150 MG/ML prefilled syringe, Inject 300 mg into the skin every 28 (twenty-eight) days., Disp: , Rfl:    omega-3 acid ethyl esters (LOVAZA) 1 g capsule, Take 2 capsules by mouth 2 (two) times daily., Disp: , Rfl:    omeprazole (PRILOSEC) 40 MG capsule, Take 1 capsule (40 mg total) by mouth daily., Disp: 90 capsule, Rfl: 3   ondansetron (ZOFRAN-ODT) 4 MG disintegrating tablet, Take 1 tablet (4 mg total) by mouth every 8 (eight) hours as needed for nausea or vomiting., Disp: 20 tablet, Rfl: 0   oxyCODONE-acetaminophen (PERCOCET) 10-325 MG tablet, Take 1 tablet by mouth 3 (three) times daily as needed., Disp: , Rfl:    Probiotic Product (PROBIOTIC DAILY PO), Take by mouth. probiogen probiotic daily, Disp: , Rfl:    QUEtiapine (SEROQUEL XR) 400 MG 24 hr tablet, Take 400 mg by mouth daily., Disp: , Rfl:    scopolamine (TRANSDERM-SCOP) 1 MG/3DAYS, Place 1 patch (1.5 mg total) onto the skin every 3 (three) days. 4 hours before you get on boat, Disp: 4 patch, Rfl: 0   topiramate (TOPAMAX) 200 MG tablet, Take 200 mg by mouth daily., Disp: , Rfl:    valACYclovir (VALTREX) 1000 MG tablet, Take 1 tablet by mouth twice daily for 7 days, Disp: 90  tablet, Rfl: 0   vitamin B-12 (CYANOCOBALAMIN) 1000 MCG tablet, Take 1,000 mcg by mouth daily., Disp: , Rfl:    zinc oxide 20 % ointment, Apply 1 application topically 2 (two) times daily as needed for irritation., Disp: 60 g, Rfl: 2   Objective:   There were no vitals filed for this visit.  Physical Exam Constitutional:      Appearance: Normal appearance.  HENT:     Head: Normocephalic and atraumatic.  Cardiovascular:     Rate and Rhythm: Normal rate.     Pulses: Normal pulses.  Abdominal:     Palpations: Abdomen is soft. There is no mass.     Hernia: No hernia is present.  Skin:    General: Skin is warm.     Capillary Refill: Capillary refill takes less than 2 seconds.     Findings: Rash present.  Neurological:     Mental Status: She is alert and oriented to person, place, and time.  Psychiatric:        Mood and Affect: Mood normal.        Behavior: Behavior normal.        Thought Content: Thought content normal.        Judgment: Judgment normal.     Assessment & Plan:  Mitral valve insufficiency, unspecified etiology  Lumbosacral radiculitis  Osteoarthritis of both knees, unspecified osteoarthritis type  Class 2 severe obesity due to excess calories with serious comorbidity and body mass index (BMI) of 37.0 to 37.9 in adult [facility])  Prediabetes  Intertrigo  Panniculitis  We discussed the amount to be removed and what the scars would look like.  I also discussed that she has a very high chance of skin breakdown and wounds.  If this happens it will  take some time for it to heal.  He is to understand that based on our discussion.  I would like her to increase her protein and decrease her carbs in her sugars between now and surgery.  Also recommend compression socks.  Pictures were obtained of the patient and placed in the chart with the patient's or guardian's permission.   [place], DO

## 2022-04-22 NOTE — Progress Notes (Signed)
Patient ID: Heather Sweeney, female    DOB: [birth year], 56 y.o.   MRN: [id number]   Chief Complaint  Patient presents with   Advice Only    The patient is a 56 year old female here for meeting for discussion about a panniculectomy.  She has complaints of rashes over in her.  She has had a hysterectomy foot surgery in the past.  She has prediabetes, hyperlipidemia and hypertension.  She is 5 feet 3 inches tall and weighs 205 pounds.    Review of Systems  Constitutional: Negative.   Eyes: Negative.   Respiratory: Negative.  Negative for shortness of breath.   Cardiovascular:  Positive for leg swelling.  Gastrointestinal: Negative.   Endocrine: Negative.   Musculoskeletal:  Positive for back pain and neck pain.  Skin:  Positive for rash.    Past Medical History:  Diagnosis Date   Allergic rhinitis    Arthritis of knee, right    Asthma    Bipolar disorder ([place])    Chronic low back pain    CTS (carpal tunnel syndrome)    hands   Depression    Gallstones    GERD (gastroesophageal reflux disease)    History of prediabetes    Hypertension    Laceration of right foot    Onychomycosis    OSA on CPAP    Overactive bladder    PVC's (premature ventricular contractions)    PVC's (premature ventricular contractions)    Dr. Raliegh Ip cards   Seizures [facility])    8th grade    Past Surgical History:  Procedure Laterality Date   ABDOMINAL HYSTERECTOMY  07/08/2011   ABDOMINAL HYSTERECTOMY     supracervical in 07/2011    [place]     right hand [place]     2012   CHOLECYSTECTOMY N/A 01/12/2018   Procedure: LAPAROSCOPIC CHOLECYSTECTOMY WITH INTRAOPERATIVE CHOLANGIOGRAM ERAS PATHWAY;  Surgeon: Johnathan Hausen, MD;  Location: [facility] ORS;  Service: General;  Laterality: N/A;   COLONOSCOPY WITH PROPOFOL N/A 04/15/2019   Procedure: COLONOSCOPY WITH PROPOFOL;  Surgeon: Jonathon Bellows, MD;  Location: [facility] ENDOSCOPY;  Service: Gastroenterology;  Laterality: N/A;   DILATION  AND CURETTAGE OF UTERUS     04/2011 benign endometrial polyp squamous metaplasia, inflamm, blood, mucous, benign    ESOPHAGOGASTRODUODENOSCOPY (EGD) WITH PROPOFOL N/A 10/23/2020   Procedure: ESOPHAGOGASTRODUODENOSCOPY (EGD) WITH PROPOFOL;  Surgeon: Jonathon Bellows, MD;  Location: [facility] ENDOSCOPY;  Service: Gastroenterology;  Laterality: N/A;   FLEXIBLE SIGMOIDOSCOPY N/A 02/15/2021   Procedure: FLEXIBLE SIGMOIDOSCOPY;  Surgeon: Jonathon Bellows, MD;  Location: [facility] ENDOSCOPY;  Service: Gastroenterology;  Laterality: N/A;   FOOT SURGERY     Dr. Prudence Davidson    LEEP     2006   TUBAL LIGATION        Current Outpatient Medications:    ACCU-CHEK GUIDE test strip, Use as instructed, Disp: 100 strip, Rfl: 0   Accu-Chek Softclix Lancets lancets, Use as instructed, Disp: 100 each, Rfl: 0   albuterol (VENTOLIN HFA) 108 (90 Base) MCG/ACT inhaler, Inhale 1-2 puffs into the lungs every 6 (six) hours as needed for wheezing or shortness of breath., Disp: 18 g, Rfl: 11   Alcohol Swabs (ALCOHOL PADS) 70 % PADS, Use as directed,once daily to test blood sugar, Disp: 100 each, Rfl: 3   amLODipine (NORVASC) 5 MG tablet, Take 1 tablet (5 mg total) by mouth daily., Disp: 90 tablet, Rfl: 3   benzonatate (TESSALON) 200 MG capsule, Take 1 capsule (  200 mg total) by mouth 3 (three) times daily as needed for cough., Disp: 40 capsule, Rfl: 0   benztropine (COGENTIN) 1 MG tablet, Take 1 mg by mouth 2 (two) times daily., Disp: , Rfl:    Biotin w/ Vitamins C & E (HAIR/SKIN/NAILS PO), Take 2,500 mcg by mouth daily., Disp: , Rfl:    Blood Glucose Calibration (TRUE METRIX LEVEL 1) Low SOLN, , Disp: , Rfl:    Blood Glucose Monitoring Suppl (ACCU-CHEK GUIDE ME) w/Device KIT, Use as directed,once daily to test blood sugar, Disp: 1 kit, Rfl: 0   buPROPion (WELLBUTRIN XL) 150 MG 24 hr tablet, Take 150 mg by mouth every morning., Disp: , Rfl:    celecoxib (CELEBREX) 200 MG capsule, Take 1 capsule by mouth 2 (two) times daily as needed., Disp: , Rfl:     cetirizine (ZYRTEC ALLERGY) 10 MG tablet, Take 2 tablets (20 mg total) by mouth daily., Disp: 180 tablet, Rfl: 3   clobetasol cream (TEMOVATE) 8.85 %, Apply 1 application topically 2 (two) times daily., Disp: , Rfl:    clotrimazole (CLOTRIMAZOLE AF) 1 % cream, Apply 1 application topically 2 (two) times daily., Disp: 60 g, Rfl: 2   Dextromethorphan-Guaifenesin 60-1200 MG 12hr tablet, Take 1 tablet by mouth every 12 (twelve) hours. Prn cough green label, Disp: 30 tablet, Rfl: 0   EASY COMFORT PEN NEEDLES 31G X 8 MM MISC, INJECT VICTOZA ONCE DAILY AS DIRECTED, Disp: 100 each, Rfl: 3   EPINEPHrine 0.3 mg/0.3 mL IJ SOAJ injection, Inject 0.3 mg into the muscle as needed for anaphylaxis., Disp: 2 each, Rfl: 3   famotidine (PEPCID) 40 MG tablet, Take 1 tablet (40 mg total) by mouth daily., Disp: 90 tablet, Rfl: 3   fexofenadine (ALLEGRA) 180 MG tablet, Take 2 tablets (360 mg total) by mouth every morning. Prn only if needed either take zyrtec or allegra explain to patient, Disp: 180 tablet, Rfl: 3   FIBER ADULT GUMMIES PO, Take by mouth., Disp: , Rfl:    FLUoxetine (PROZAC) 40 MG capsule, Take 40 mg by mouth daily., Disp: , Rfl:    fluPHENAZine (PROLIXIN) 2.5 MG tablet, Take 2.5 mg by mouth 2 (two) times daily., Disp: , Rfl:    fluticasone (FLONASE) 50 MCG/ACT nasal spray, Place 2 sprays into both nostrils daily., Disp: 48 g, Rfl: 3   furosemide (LASIX) 20 MG tablet, Take 0.5-1 tablets (10-20 mg total) by mouth daily as needed., Disp: 90 tablet, Rfl: 1   hydrocortisone 2.5 % ointment, Apply topically 2 (two) times daily. Neck, Disp: 30 g, Rfl: 5   hydroxychloroquine (PLAQUENIL) 200 MG tablet, Take 200 mg by mouth 2 (two) times daily., Disp: , Rfl:    Lancet Devices (EASY MINI EJECT LANCING DEVICE) MISC, Use as directed,once daily to test blood sugar, Disp: 1 each, Rfl: 3   liraglutide (VICTOZA) 18 MG/3ML SOPN, Inject 0.6 mg into the skin daily. X 1 week, 1.2 qd x week 2, 1.8 qd x week 3, 2.4 qd x week 4  and 3 mg qd x week 5 and beyond goal wt 180s, Disp: 3 mL, Rfl: 11   M-DRYL 12.5 MG/5ML liquid, Take 5 mLs by mouth., Disp: , Rfl:    Melatonin 10 MG TABS, Take by mouth at bedtime., Disp: , Rfl:    meloxicam (MOBIC) 15 MG tablet, Take 15 mg by mouth daily., Disp: , Rfl:    metoprolol succinate (TOPROL-XL) 25 MG 24 hr tablet, Take 1 tablet (25 mg total) by mouth daily.,  Disp: 90 tablet, Rfl: 3   montelukast (SINGULAIR) 10 MG tablet, Take 1 tablet (10 mg total) by mouth daily., Disp: 90 tablet, Rfl: 3   Multiple Vitamins-Minerals (ALIVE MULTI-VITAMIN PO), Take by mouth daily in the afternoon., Disp: , Rfl:    mupirocin ointment (BACTROBAN) 2 %, Apply 1 Application topically 2 (two) times daily. Prn right knee wound, left ring finger, Disp: 30 g, Rfl: 2   omalizumab (XOLAIR) 150 MG/ML prefilled syringe, Inject 300 mg into the skin every 28 (twenty-eight) days., Disp: , Rfl:    omega-3 acid ethyl esters (LOVAZA) 1 g capsule, Take 2 capsules by mouth 2 (two) times daily., Disp: , Rfl:    omeprazole (PRILOSEC) 40 MG capsule, Take 1 capsule (40 mg total) by mouth daily., Disp: 90 capsule, Rfl: 3   ondansetron (ZOFRAN-ODT) 4 MG disintegrating tablet, Take 1 tablet (4 mg total) by mouth every 8 (eight) hours as needed for nausea or vomiting., Disp: 20 tablet, Rfl: 0   oxyCODONE-acetaminophen (PERCOCET) 10-325 MG tablet, Take 1 tablet by mouth 3 (three) times daily as needed., Disp: , Rfl:    Probiotic Product (PROBIOTIC DAILY PO), Take by mouth. probiogen probiotic daily, Disp: , Rfl:    QUEtiapine (SEROQUEL XR) 400 MG 24 hr tablet, Take 400 mg by mouth daily., Disp: , Rfl:    scopolamine (TRANSDERM-SCOP) 1 MG/3DAYS, Place 1 patch (1.5 mg total) onto the skin every 3 (three) days. 4 hours before you get on boat, Disp: 4 patch, Rfl: 0   topiramate (TOPAMAX) 200 MG tablet, Take 200 mg by mouth daily., Disp: , Rfl:    valACYclovir (VALTREX) 1000 MG tablet, Take 1 tablet by mouth twice daily for 7 days, Disp: 90  tablet, Rfl: 0   vitamin B-12 (CYANOCOBALAMIN) 1000 MCG tablet, Take 1,000 mcg by mouth daily., Disp: , Rfl:    zinc oxide 20 % ointment, Apply 1 application topically 2 (two) times daily as needed for irritation., Disp: 60 g, Rfl: 2   Objective:   There were no vitals filed for this visit.  Physical Exam Constitutional:      Appearance: Normal appearance.  HENT:     Head: Normocephalic and atraumatic.  Cardiovascular:     Rate and Rhythm: Normal rate.     Pulses: Normal pulses.  Abdominal:     Palpations: Abdomen is soft. There is no mass.     Hernia: No hernia is present.  Skin:    General: Skin is warm.     Capillary Refill: Capillary refill takes less than 2 seconds.     Findings: Rash present.  Neurological:     Mental Status: She is alert and oriented to person, place, and time.  Psychiatric:        Mood and Affect: Mood normal.        Behavior: Behavior normal.        Thought Content: Thought content normal.        Judgment: Judgment normal.     Assessment & Plan:  Mitral valve insufficiency, unspecified etiology  Lumbosacral radiculitis  Osteoarthritis of both knees, unspecified osteoarthritis type  Class 2 severe obesity due to excess calories with serious comorbidity and body mass index (BMI) of 37.0 to 37.9 in adult [facility])  Prediabetes  Intertrigo  Panniculitis  We discussed the amount to be removed and what the scars would look like.  I also discussed that she has a very high chance of skin breakdown and wounds.  If this happens it will  take some time for it to heal.  He is to understand that based on our discussion.  I would like her to increase her protein and decrease her carbs in her sugars between now and surgery.  Also recommend compression socks.  Pictures were obtained of the patient and placed in the chart with the patient's or guardian's permission.   [place], DO

## 2022-04-23 ENCOUNTER — Telehealth: Payer: Self-pay | Admitting: Plastic Surgery

## 2022-04-23 ENCOUNTER — Encounter (HOSPITAL_COMMUNITY): Payer: Self-pay | Admitting: Plastic Surgery

## 2022-04-23 ENCOUNTER — Other Ambulatory Visit: Payer: Self-pay

## 2022-04-23 NOTE — Progress Notes (Addendum)
PCP - Dr Olivia Mackie McLean-Scocuzza Cardiologist - n/a Allergy - Dr Audry Pili - Dr Jonathon Bellows Orthopedic - Dr Kurtis Bushman   CT Chest x-ray - 03/12/22 EKG - DOS Stress Test - 07/12/16 ECHO - 10/14/17 Cardiac Cath - n/a  ICD Pacemaker/Loop - n/a  Sleep Study -  Yes CPAP - do not use CPAP  ERAS: Clear liquids til 10:45 AM DOS  Anesthesia review: Yes  STOP now taking any Aspirin (unless otherwise instructed by your surgeon), Aleve, Naproxen, Ibuprofen, Motrin, Advil, Goody's, BC's, all herbal medications, fish oil, and all vitamins.   Coronavirus Screening Do you have any of the following symptoms:  Cough yes/no: No Fever (>100.19F)  yes/no: No Runny nose yes/no: No Sore throat yes/no: No Difficulty breathing/shortness of breath  yes/no: No  Have you traveled in the last 14 days and where? [place]  Patient verbalized understanding of instructions that were given via phone.

## 2022-04-23 NOTE — Telephone Encounter (Signed)
Called [facility] [phone] Sonia Baller V  She said sees two OP approvals IP not on file.  She submitted Auth # [id number] call ref # [id number]--- emailed [place] to advise - where do they want documentation

## 2022-04-23 NOTE — Telephone Encounter (Signed)
CORRECTION -- [id number] AUTH FOR 07.10---10.08.23 IS GOOD FOR OBS OUTPATIENT FOR 07.20.23 SURGERY

## 2022-04-24 ENCOUNTER — Encounter (HOSPITAL_COMMUNITY)
Admission: RE | Disposition: A | Discharge status: Home / Self Care | Payer: Self-pay | Source: Home / Self Care | Attending: Plastic Surgery

## 2022-04-24 ENCOUNTER — Ambulatory Visit (HOSPITAL_BASED_OUTPATIENT_CLINIC_OR_DEPARTMENT_OTHER): Payer: Medicare Other | Admitting: Certified Registered Nurse Anesthetist

## 2022-04-24 ENCOUNTER — Other Ambulatory Visit: Payer: Self-pay

## 2022-04-24 ENCOUNTER — Encounter (HOSPITAL_COMMUNITY): Payer: Self-pay | Admitting: Plastic Surgery

## 2022-04-24 ENCOUNTER — Ambulatory Visit (HOSPITAL_COMMUNITY): Payer: Medicare Other | Admitting: Certified Registered Nurse Anesthetist

## 2022-04-24 ENCOUNTER — Observation Stay (HOSPITAL_COMMUNITY)
Admission: RE | Admit: 2022-04-24 | Discharge: 2022-04-24 | Disposition: A | Discharge status: Home / Self Care | Payer: Medicare Other | Attending: Plastic Surgery | Admitting: Plastic Surgery

## 2022-04-24 DIAGNOSIS — I10 Essential (primary) hypertension: Secondary | ICD-10-CM

## 2022-04-24 DIAGNOSIS — M793 Panniculitis, unspecified: Principal | ICD-10-CM | POA: Diagnosis present

## 2022-04-24 DIAGNOSIS — Z79899 Other long term (current) drug therapy: Secondary | ICD-10-CM | POA: Diagnosis not present

## 2022-04-24 DIAGNOSIS — G4730 Sleep apnea, unspecified: Secondary | ICD-10-CM

## 2022-04-24 DIAGNOSIS — F17290 Nicotine dependence, other tobacco product, uncomplicated: Secondary | ICD-10-CM | POA: Diagnosis not present

## 2022-04-24 DIAGNOSIS — J45909 Unspecified asthma, uncomplicated: Secondary | ICD-10-CM | POA: Insufficient documentation

## 2022-04-24 HISTORY — DX: Anxiety disorder, unspecified: F41.9

## 2022-04-24 HISTORY — PX: PANNICULECTOMY: SHX5360

## 2022-04-24 HISTORY — DX: Prediabetes: R73.03

## 2022-04-24 LAB — BASIC METABOLIC PANEL
Anion gap: 8 (ref 5–15)
BUN: 8 mg/dL (ref 6–20)
CO2: 24 mmol/L (ref 22–32)
Calcium: 9.2 mg/dL (ref 8.9–10.3)
Chloride: 109 mmol/L (ref 98–111)
Creatinine, Ser: 0.96 mg/dL (ref 0.44–1.00)
GFR, Estimated: >60 mL/min (ref >60)
Glucose, Bld: 95 mg/dL (ref 70–99)
Potassium: 4 mmol/L (ref 3.5–5.1)
Sodium: 141 mmol/L (ref 135–145)

## 2022-04-24 LAB — CBC
HCT: 31.5 % — ABNORMAL LOW (ref 36.0–46.0)
Hemoglobin: 11.4 g/dL — ABNORMAL LOW (ref 12.0–15.0)
MCH: 29.8 pg (ref 26.0–34.0)
MCHC: 36.2 g/dL — ABNORMAL HIGH (ref 30.0–36.0)
MCV: 82.5 fL (ref 80.0–100.0)
Platelets: 204 10*3/uL (ref 150–400)
RBC: 3.82 MIL/uL — ABNORMAL LOW (ref 3.87–5.11)
RDW: 14.7 % (ref 11.5–15.5)
WBC: 5.2 10*3/uL (ref 4.0–10.5)
nRBC: 0 % (ref 0.0–0.2)

## 2022-04-24 SURGERY — PANNICULECTOMY
Anesthesia: General | Site: Abdomen

## 2022-04-24 MED ORDER — SUGAMMADEX SODIUM 200 MG/2ML IV SOLN
INTRAVENOUS | Status: DC | PRN
  Administered 2022-04-24: 200 mg via INTRAVENOUS

## 2022-04-24 MED ORDER — PROPOFOL 10 MG/ML IV BOLUS
INTRAVENOUS | Status: AC
Start: 2022-04-24 — End: ?
  Filled 2022-04-24: qty 20

## 2022-04-24 MED ORDER — FENTANYL CITRATE (PF) 100 MCG/2ML IJ SOLN: 25.0000 ug | INTRAMUSCULAR | Status: DC | PRN

## 2022-04-24 MED ORDER — MIDAZOLAM HCL 2 MG/2ML IJ SOLN
INTRAMUSCULAR | Status: AC
  Filled 2022-04-24: qty 2

## 2022-04-24 MED ORDER — PHENYLEPHRINE 80 MCG/ML (10ML) SYRINGE FOR IV PUSH (FOR BLOOD PRESSURE SUPPORT)
PREFILLED_SYRINGE | INTRAVENOUS | Status: DC | PRN
  Administered 2022-04-24: 240 ug via INTRAVENOUS
  Administered 2022-04-24: 160 ug via INTRAVENOUS
  Administered 2022-04-24 (×3): 80 ug via INTRAVENOUS

## 2022-04-24 MED ORDER — SODIUM BICARBONATE 4.2 % IV SOLN
Freq: Once | INTRAVENOUS | Status: DC
  Filled 2022-04-24: qty 50

## 2022-04-24 MED ORDER — ROCURONIUM BROMIDE 10 MG/ML (PF) SYRINGE
PREFILLED_SYRINGE | INTRAVENOUS | Status: DC | PRN
  Administered 2022-04-24: 100 mg via INTRAVENOUS

## 2022-04-24 MED ORDER — SODIUM CHLORIDE 0.9 % IV SOLN
250.0000 mL | INTRAVENOUS | Status: DC | PRN
Start: 2022-04-24 — End: 2022-04-24

## 2022-04-24 MED ORDER — BUPIVACAINE HCL (PF) 0.25 % IJ SOLN
INTRAMUSCULAR | Status: AC
Start: 2022-04-24 — End: ?
  Filled 2022-04-24: qty 30

## 2022-04-24 MED ORDER — VANCOMYCIN HCL IN DEXTROSE 1-5 GM/200ML-% IV SOLN: 1000.0000 mg | INTRAVENOUS | Status: AC

## 2022-04-24 MED ORDER — VANCOMYCIN HCL IN DEXTROSE 1-5 GM/200ML-% IV SOLN
INTRAVENOUS | Status: AC
  Administered 2022-04-24: 1000 mg via INTRAVENOUS
  Filled 2022-04-24: qty 200

## 2022-04-24 MED ORDER — LACTATED RINGERS IV SOLN
INTRAVENOUS | Status: DC
Start: 2022-04-24 — End: 2022-04-24

## 2022-04-24 MED ORDER — PHENYLEPHRINE HCL-NACL 20-0.9 MG/250ML-% IV SOLN
INTRAVENOUS | Status: DC | PRN
  Administered 2022-04-24: 20 ug/min via INTRAVENOUS

## 2022-04-24 MED ORDER — ORAL CARE MOUTH RINSE: 15.0000 mL | Freq: Once | OROMUCOSAL | Status: AC

## 2022-04-24 MED ORDER — FENTANYL CITRATE (PF) 250 MCG/5ML IJ SOLN
INTRAMUSCULAR | Status: AC
  Filled 2022-04-24: qty 5

## 2022-04-24 MED ORDER — LIDOCAINE-EPINEPHRINE 1 %-1:100000 IJ SOLN
INTRAMUSCULAR | Status: DC | PRN
  Administered 2022-04-24: 47 mL

## 2022-04-24 MED ORDER — ACETAMINOPHEN 325 MG PO TABS: 650.0000 mg | ORAL_TABLET | ORAL | Status: DC | PRN

## 2022-04-24 MED ORDER — ACETAMINOPHEN 650 MG RE SUPP: 650.0000 mg | RECTAL | Status: DC | PRN

## 2022-04-24 MED ORDER — DEXAMETHASONE SODIUM PHOSPHATE 10 MG/ML IJ SOLN
INTRAMUSCULAR | Status: DC | PRN
  Administered 2022-04-24: 8 mg via INTRAVENOUS

## 2022-04-24 MED ORDER — FENTANYL CITRATE (PF) 250 MCG/5ML IJ SOLN
INTRAMUSCULAR | Status: DC | PRN
  Administered 2022-04-24: 100 ug via INTRAVENOUS
  Administered 2022-04-24: 50 ug via INTRAVENOUS

## 2022-04-24 MED ORDER — LIDOCAINE 2% (20 MG/ML) 5 ML SYRINGE
INTRAMUSCULAR | Status: DC | PRN
  Administered 2022-04-24: 60 mg via INTRAVENOUS

## 2022-04-24 MED ORDER — PROPOFOL 10 MG/ML IV BOLUS
INTRAVENOUS | Status: DC | PRN
Start: 2022-04-24 — End: 2022-04-24
  Administered 2022-04-24: 70 mg via INTRAVENOUS

## 2022-04-24 MED ORDER — ONDANSETRON HCL 4 MG/2ML IJ SOLN
INTRAMUSCULAR | Status: DC | PRN
  Administered 2022-04-24: 4 mg via INTRAVENOUS

## 2022-04-24 MED ORDER — SODIUM CHLORIDE 0.9% FLUSH: 3.0000 mL | INTRAVENOUS | Status: DC | PRN

## 2022-04-24 MED ORDER — SODIUM CHLORIDE 0.9% FLUSH: 3.0000 mL | Freq: Two times a day (BID) | INTRAVENOUS | Status: DC

## 2022-04-24 MED ORDER — LIDOCAINE-EPINEPHRINE 1 %-1:100000 IJ SOLN
INTRAMUSCULAR | Status: AC
  Filled 2022-04-24: qty 1

## 2022-04-24 MED ORDER — OXYCODONE HCL 5 MG PO TABS: 5.0000 mg | ORAL_TABLET | ORAL | Status: DC | PRN

## 2022-04-24 MED ORDER — 0.9 % SODIUM CHLORIDE (POUR BTL) OPTIME
TOPICAL | Status: DC | PRN
  Administered 2022-04-24: 3000 mL

## 2022-04-24 MED ORDER — CHLORHEXIDINE GLUCONATE 0.12 % MT SOLN: 15.0000 mL | Freq: Once | OROMUCOSAL | Status: AC

## 2022-04-24 MED ORDER — SODIUM BICARBONATE 4.2 % IV SOLN: INTRAVENOUS | Status: DC | PRN

## 2022-04-24 MED ORDER — CHLORHEXIDINE GLUCONATE 0.12 % MT SOLN
OROMUCOSAL | Status: AC
  Administered 2022-04-24: 15 mL via OROMUCOSAL
  Filled 2022-04-24: qty 15

## 2022-04-24 MED ORDER — MIDAZOLAM HCL 2 MG/2ML IJ SOLN
INTRAMUSCULAR | Status: DC | PRN
  Administered 2022-04-24: 2 mg via INTRAVENOUS

## 2022-04-24 MED ORDER — ACETAMINOPHEN 500 MG PO TABS
1000.0000 mg | ORAL_TABLET | Freq: Once | ORAL | Status: AC
  Administered 2022-04-24: 1000 mg via ORAL
  Filled 2022-04-24: qty 2

## 2022-04-24 SURGICAL SUPPLY — 64 items
ADH SKN CLS APL DERMABOND .7 (GAUZE/BANDAGES/DRESSINGS) ×2
APPLIER CLIP 9.375 MED OPEN (MISCELLANEOUS) ×2
APR CLP MED 9.3 20 MLT OPN (MISCELLANEOUS) ×1
BAG COUNTER SPONGE SURGICOUNT (BAG) ×2 IMPLANT
BAG SPNG CNTER NS LX DISP (BAG) ×1
BINDER ABDOMINAL 10 UNV 27-48 (MISCELLANEOUS) IMPLANT
BINDER ABDOMINAL 12 SM 30-45 (SOFTGOODS) ×2 IMPLANT
BINDER ABDOMINAL 12 XL 75-84 (SOFTGOODS) ×1 IMPLANT
BIOPATCH RED 1 DISK 7.0 (GAUZE/BANDAGES/DRESSINGS) IMPLANT
BLADE CLIPPER SURG (BLADE) IMPLANT
CATH FOLEY 2WAY SLVR  5CC 12FR (CATHETERS) ×2
CATH FOLEY 2WAY SLVR 5CC 12FR (CATHETERS) IMPLANT
CLIP APPLIE 9.375 MED OPEN (MISCELLANEOUS) IMPLANT
DERMABOND ADVANCED (GAUZE/BANDAGES/DRESSINGS) ×2
DERMABOND ADVANCED .7 DNX12 (GAUZE/BANDAGES/DRESSINGS) ×2 IMPLANT
DRAIN CHANNEL 19F RND (DRAIN) ×1 IMPLANT
DRAPE HALF SHEET 40X57 (DRAPES) ×4 IMPLANT
DRAPE INCISE IOBAN 66X45 STRL (DRAPES) ×2 IMPLANT
DRAPE ORTHO SPLIT 77X108 STRL (DRAPES) ×4
DRAPE SURG ORHT 6 SPLT 77X108 (DRAPES) IMPLANT
DRSG OPSITE POSTOP 4X12 (GAUZE/BANDAGES/DRESSINGS) ×2 IMPLANT
DRSG PAD ABDOMINAL 8X10 ST (GAUZE/BANDAGES/DRESSINGS) ×5 IMPLANT
DRSG TEGADERM 4X4.75 (GAUZE/BANDAGES/DRESSINGS) ×2 IMPLANT
ELECT BLADE 4.0 EZ CLEAN MEGAD (MISCELLANEOUS) ×2
ELECT REM PT RETURN 9FT ADLT (ELECTROSURGICAL) ×2
ELECTRODE BLDE 4.0 EZ CLN MEGD (MISCELLANEOUS) ×1 IMPLANT
ELECTRODE REM PT RTRN 9FT ADLT (ELECTROSURGICAL) ×1 IMPLANT
EVACUATOR SILICONE 100CC (DRAIN) IMPLANT
GLOVE BIO SURGEON STRL SZ 6.5 (GLOVE) ×2 IMPLANT
GLOVE SURG ENC MOIS LTX SZ6.5 (GLOVE) ×6 IMPLANT
GLOVE SURG ENC MOIS LTX SZ7.5 (GLOVE) ×4 IMPLANT
GOWN STRL REUS W/ TWL LRG LVL3 (GOWN DISPOSABLE) ×2 IMPLANT
GOWN STRL REUS W/TWL LRG LVL3 (GOWN DISPOSABLE) ×4
KIT BASIN OR (CUSTOM PROCEDURE TRAY) ×2 IMPLANT
MARKER SKIN DUAL TIP RULER LAB (MISCELLANEOUS) ×1 IMPLANT
NDL HYPO 25X1 1.5 SAFETY (NEEDLE) ×1 IMPLANT
NEEDLE HYPO 22GX1.5 SAFETY (NEEDLE) ×1 IMPLANT
NEEDLE HYPO 25X1 1.5 SAFETY (NEEDLE) ×2 IMPLANT
NS IRRIG 1000ML POUR BTL (IV SOLUTION) ×2 IMPLANT
PACK GENERAL/GYN (CUSTOM PROCEDURE TRAY) ×2 IMPLANT
PACK UNIVERSAL I (CUSTOM PROCEDURE TRAY) ×1 IMPLANT
PENCIL SMOKE EVACUATOR (MISCELLANEOUS) ×2 IMPLANT
PIN SAFETY STERILE (MISCELLANEOUS) IMPLANT
SLEEVE SCD COMPRESS KNEE MED (STOCKING) ×2 IMPLANT
SPONGE T-LAP 18X18 ~~LOC~~+RFID (SPONGE) ×2 IMPLANT
STAPLER VISISTAT 35W (STAPLE) ×2 IMPLANT
STRIP CLOSURE SKIN 1/2X4 (GAUZE/BANDAGES/DRESSINGS) ×1 IMPLANT
SUT MNCRL AB 4-0 PS2 18 (SUTURE) ×8 IMPLANT
SUT MON AB 3-0 SH 27 (SUTURE) ×8
SUT MON AB 3-0 SH27 (SUTURE) ×2 IMPLANT
SUT MON AB 5-0 PS2 18 (SUTURE) ×3 IMPLANT
SUT PDS AB 2-0 CT1 27 (SUTURE) ×4 IMPLANT
SUT PDS AB 3-0 SH 27 (SUTURE) ×5 IMPLANT
SUT SILK 3 0 SH 30 (SUTURE) ×1 IMPLANT
SUT VIC AB 3-0 SH 27 (SUTURE)
SUT VIC AB 3-0 SH 27X BRD (SUTURE) ×1 IMPLANT
SUT VICRYL 4-0 PS2 18IN ABS (SUTURE) ×2 IMPLANT
SYR CONTROL 10ML LL (SYRINGE) ×2 IMPLANT
TOWEL GREEN STERILE FF (TOWEL DISPOSABLE) ×4 IMPLANT
TRAY FOLEY W/BAG SLVR 16FR (SET/KITS/TRAYS/PACK) ×2
TRAY FOLEY W/BAG SLVR 16FR ST (SET/KITS/TRAYS/PACK) IMPLANT
TUBING INFILTRATION IT-10001 (TUBING) ×1 IMPLANT
TUBING SET GRADUATE ASPIR 12FT (MISCELLANEOUS) ×1 IMPLANT
UNDERPAD 30X36 HEAVY ABSORB (UNDERPADS AND DIAPERS) ×4 IMPLANT

## 2022-04-24 NOTE — Anesthesia Postprocedure Evaluation (Signed)
Anesthesia Post Note  Patient: [name]  Procedure(s) Performed: INFRAUMBILICAL PANNICULECTOMY (Abdomen)     Patient location during evaluation: PACU Anesthesia Type: General Level of consciousness: awake and alert Pain management: pain level controlled Vital Signs Assessment: post-procedure vital signs reviewed and stable Respiratory status: spontaneous breathing, nonlabored ventilation and respiratory function stable Cardiovascular status: blood pressure returned to baseline and stable Postop Assessment: no apparent nausea or vomiting Anesthetic complications: no   No notable events documented.  Last Vitals:  Vitals:   04/24/22 1640 04/24/22 1700  BP: (!) 157/86 (!) 150/93  Pulse: 81 68  Resp: (!) 28 (!) 21  Temp:  (!) 36.4 C  SpO2: 99% 99%    Last Pain:  Vitals:   04/24/22 1700  PainSc: 0-No pain                 Darleen Moffitt,W. EDMOND

## 2022-04-24 NOTE — Op Note (Signed)
Operative Report  Date of operation: 04/24/2022  Patient: Heather Sweeney, MRN: [id number], 56 y.o. female.   Date of birth: Jul 14, 1966  Location: [facility]  Preoperative Diagnosis: Panniculitis  Postoperative Diagnosis:  Same  Procedure: Panniculectomy  Surgeon:  Theodoro Kos Blia Totman  Assistant:  Lenn Sink, PA  Anesthesia:  General  EBL:  100cc  Drains:  27 blake round drains  Condition:  Stable  Complications: None  Disposition: Recovery Room  Procedure in Detail: Patient was seen the morning of her surgery and marked out for the procedure. She was then given an IV and IV antibiotics. The patient was taken to the operating room and underwent general anesthesia.  A time out was called and all information was confirmed to be correct. SCD's and a pillow under the knees was in place. The patient was then prepped and draped in the standard sterile fashion. Local was placed into the incision and 2 stab incisions made through which 400 cc of tumescent was placed in each flank area. The flanks were liposuctioned and 1100 cc removed from the sides. The planned lower incision was then incised and the incision taken down through the Scarpa's fascia to the rectus abdominus fascia. The skin and subcutaneous tissue was then lifted off the fascia up to the level of the umbilicus.   The patient was then flexed on the table and the amount that could be excised was confirmed. The pannus was excised and it weighed 1550 grams. The mons was also suspended with 3-0 Monocryl.  The wound was irrigated with normal saline solution. A #19 blake round drain was placed and secured with 3-0 Silk.  The abdominal wall was closed with buried 2-0 PDS and 3-0 PDS.  Then the 3-0 Monocryl and subcuticular 4-0 Monocryl was placed.    The wound was then dressed with dermabond. ABD's and an abdominal binder were placed. Patient was allowed to wake up, extubated and taken on a stretcher  in the flexed position to the recovery room. Family was notified at the end of the case.   The advanced practice practitioner (APP) assisted throughout the case.  The APP was essential in retraction and counter traction when needed to make the case progress smoothly.  This retraction and assistance made it possible to see the tissue plans for the procedure.  The assistance was needed for blood control, tissue re-approximation and assisted with closure of the incision site.

## 2022-04-24 NOTE — Anesthesia Preprocedure Evaluation (Addendum)
Anesthesia Evaluation  Patient identified by MRN, date of birth, ID band Patient awake    Reviewed: Allergy & Precautions, NPO status , Patient's Chart, lab work & pertinent test results  Airway Mallampati: III  TM Distance: >3 FB Neck ROM: Full    Dental  (+) Edentulous Upper, Edentulous Lower, Dental Advisory Given   Pulmonary asthma , sleep apnea ,    Pulmonary exam normal breath sounds clear to auscultation       Cardiovascular hypertension, Normal cardiovascular exam Rhythm:Regular Rate:Normal  TTE 2017 Normal EF, mild MR   Neuro/Psych Seizures -, Well Controlled,  PSYCHIATRIC DISORDERS Anxiety Depression Bipolar Disorder    GI/Hepatic Neg liver ROS, GERD  ,  Endo/Other  negative endocrine ROS  Renal/GU Renal InsufficiencyRenal disease  negative genitourinary   Musculoskeletal negative musculoskeletal ROS (+)   Abdominal   Peds  Hematology negative hematology ROS (+)   Anesthesia Other Findings   Reproductive/Obstetrics                            Anesthesia Physical Anesthesia Plan  ASA: 3  Anesthesia Plan: General   Post-op Pain Management: Tylenol PO (pre-op)*   Induction: Intravenous  PONV Risk Score and Plan: 3 and Midazolam, Dexamethasone and Ondansetron  Airway Management Planned: Oral ETT and LMA  Additional Equipment:   Intra-op Plan:   Post-operative Plan: Extubation in OR  Informed Consent: I have reviewed the patients History and Physical, chart, labs and discussed the procedure including the risks, benefits and alternatives for the proposed anesthesia with the patient or authorized representative who has indicated his/her understanding and acceptance.     Dental advisory given  Plan Discussed with: CRNA  Anesthesia Plan Comments:         Anesthesia Quick Evaluation

## 2022-04-24 NOTE — Interval H&P Note (Signed)
History and Physical Interval Note:  04/24/2022 1:01 PM  Heather Sweeney  has presented today for surgery, with the diagnosis of Panniculitis.  The various methods of treatment have been discussed with the patient and family. After consideration of risks, benefits and other options for treatment, the patient has consented to  Procedure(s): PANNICULECTOMY (N/A) as a surgical intervention.  The patient's history has been reviewed, patient examined, no change in status, stable for surgery.  I have reviewed the patient's chart and labs.  Questions were answered to the patient's satisfaction.     Loel Lofty Sindy Mccune

## 2022-04-24 NOTE — Discharge Instructions (Signed)
INSTRUCTIONS FOR AFTER ABDOMINAL SURGERY  You will likely have some questions about what to expect following your operation.  The following information will help you and your family understand what to expect when you get home.  Following these guidelines will help ensure a smooth recovery and reduce risks of complications.  Postoperative instructions include information on: diet, wound care, medications and physical activity.  AFTER SURGERY Expect to go home after the procedure.  In some cases, you may need to spend one night in the hospital for observation.  DIET This surgery does not require a specific diet.  However, the healthier you eat the better your body can heal. It is important to increasing your protein intake.  Limit foods with high sugar and  carbohydrate content.  Focus on vegetables, meat and other protein sources if you are vegan or vegetarian.  If you undergo liposuction during your procedure it is very important to drink 8 oz of water every hour while awake for 2 days.  If your urine is bright yellow, then it is concentrated, and you need to drink more water.  If you find you are persistently nauseated or unable to take in liquids let us know.  NO TOBACCO USE or EXPOSURE.  This will slow your healing process and increase the risk of a wound.  WOUND CARE Leave the abdominal binder in place for 3 days.  Then you can remove it and shower.  Replace the binder or spanx after your shower.   You may have Topifoam or Lipofoam on.  It is soft and spongy and helps keep you from getting creases if you have liposuction.  This can be removed before the shower and then replaced.  If you need more it is available on [employer] as lipofoam.  If you have steri-strips / tape directly attached to your skin leave them in place. It is OK to get these wet.  No baths, pools or hot tubs for four weeks. We close your incision to leave the smallest and best-looking scar. No ointment or creams on your incisions  until cleared by your surgeon.  No Neosporin (Too many skin reactions with this one).  After the steri-strips are off can use Mederma or Skinuva and start massaging the scar. Continue to wear the binder/spanx or Ace wrap around the clock, including while sleeping, for 6 weeks. This provides added comfort and helps reduce the fluid accumulation at the surgery site.  ACTIVITY No heavy lifting until cleared by the doctor.  For example, no more than a half-gallon of milk.  It is OK to walk and you are encouraged to move your legs to help decrease your risk of getting a blood clot.  It will also help keep you from getting deconditioned.  Every 1 to 2 hours get up and walk for 5 minutes. This will help with a quicker recovery back to normal.  Let pain be your guide so you don't do too much.     SLEEPING / RESTING Sleeping and resting should be in the jack-knife or bent forward position with your head elevated.  This will help reduce pulling on your abdominal incision.  You can elevate your head and upper back with a few pillows and place a pillow under your knees.  Avoid stomach sleeping for 3 months.   WORK Everyone returns to work at different times. As a rough guide, most people take 1 - 2 weeks off prior to returning to work. If you need documentation for your   job give them to the front staff for processing.  DRIVING Arrange for someone to bring you home from the hospital.  You may be able to drive a few days after surgery but not while taking any narcotics or valium.  This is for your safety as well as others sharing the road with you.  BOWEL MOVEMENTS Constipation can occur after anesthesia and while taking pain medication.  It is important to stay ahead for your comfort.  We recommend taking Milk of Magnesia (2 tablespoons; twice a day) while taking the pain pills.  MEDICATIONS (you may receive and should be started after surgery) At your preoperative visit for you history and physical you were  given the following medications: Antibiotic: Start this medication when you get home and take according to the instructions on the bottle. Zofran 4 mg:  This is to treat nausea and vomiting.  You can take this every 6 hours as needed and only if needed. Norco (hydrocodone/acetaminophen) 5/325 mg:  This is only to be used after you have taken the Motrin or the Tylenol. Every 8 hours as needed.  Over the counter Medication to take: Ibuprofen (Motrin) 600 mg:  Take this every 6 hours.  If you have additional pain then take 500 mg of the Tylenol.  Only take the Norco after you have tried these two. MiraLAX or stool softener of choice: Take this according to the bottle if you take the Norco.  WHEN TO CALL Call your surgeon's office if any of the following occur:  Fever 101 degrees F or greater  Excessive bleeding or fluid from the incision site.  Pain that increases over time without aid from the medications  Redness, warmth, or pus draining from incision sites  Persistent nausea or inability to take in liquids  Severe misshapen area that underwent the operation.   

## 2022-04-24 NOTE — Anesthesia Procedure Notes (Signed)
Procedure Name: Intubation Date/Time: 04/24/2022 1:23 PM  Performed by: Leonor Liv, CRNAPre-anesthesia Checklist: Patient identified, Emergency Drugs available, Suction available and Patient being monitored Patient Re-evaluated:Patient Re-evaluated prior to induction Oxygen Delivery Method: Circle system utilized Preoxygenation: Pre-oxygenation with 100% oxygen Induction Type: IV induction Ventilation: Mask ventilation without difficulty Laryngoscope Size: Mac and 3 Grade View: Grade I Tube type: Oral Tube size: 7.0 mm Number of attempts: 1 Airway Equipment and Method: Stylet Placement Confirmation: ETT inserted through vocal cords under direct vision, positive ETCO2 and breath sounds checked- equal and bilateral Secured at: 21 cm Tube secured with: Tape Dental Injury: Teeth and Oropharynx as per pre-operative assessment

## 2022-04-24 NOTE — Transfer of Care (Signed)
Immediate Anesthesia Transfer of Care Note  Patient: Heather Sweeney  Procedure(s) Performed: INFRAUMBILICAL PANNICULECTOMY (Abdomen)  Patient Location: PACU  Anesthesia Type:General  Level of Consciousness: drowsy  Airway & Oxygen Therapy: Patient Spontanous Breathing and Patient connected to nasal cannula oxygen  Post-op Assessment: Report given to RN and Post -op Vital signs reviewed and stable  Post vital signs: Reviewed and stable  Last Vitals:  Vitals Value Taken Time  BP 157/89 04/24/22 1608  Temp    Pulse 79 04/24/22 1609  Resp 19 04/24/22 1609  SpO2 100 % 04/24/22 1609  Vitals shown include unvalidated device data.  Last Pain:  Vitals:   04/24/22 1217  PainSc: 0-No pain         Complications: No notable events documented.

## 2022-04-24 NOTE — Telephone Encounter (Signed)
Lattie Haw has taken care of this matter.

## 2022-04-25 ENCOUNTER — Encounter (HOSPITAL_COMMUNITY): Payer: Self-pay | Admitting: Plastic Surgery

## 2022-04-28 ENCOUNTER — Other Ambulatory Visit: Payer: Self-pay | Admitting: Internal Medicine

## 2022-04-28 DIAGNOSIS — B001 Herpesviral vesicular dermatitis: Secondary | ICD-10-CM

## 2022-04-28 LAB — SURGICAL PATHOLOGY

## 2022-04-30 NOTE — Discharge Summary (Signed)
Physician Discharge Summary  Patient ID: Heather Sweeney MRN: [id number] DOB/AGE: [birth year] 56 y.o.  Admit date: 04/24/2022 Discharge date: 04/30/2022  Admission Diagnoses:  Discharge Diagnoses:  Principal Problem:   Panniculitis   Discharged Condition: good  Hospital Course: The patient was admitted and underwent a panniculectomy.  She did well for the surgery.  She was kept overnight and was tolerating food and pain well.  She was discharged in the morning to home.  Consults:  none  Significant Diagnostic Studies: none  Treatments: surgery:  Discharge Exam: Blood pressure (!) 150/93, pulse 68, temperature (!) 97.5 F (36.4 C), resp. rate (!) 21, height '5\' 3"'$  (1.6 m), weight 96.2 kg, SpO2 99 %. General appearance: alert, cooperative, and no distress Incision/Wound: intact  Disposition: Discharge disposition: 01-Home or Self Care        Allergies as of 04/24/2022       Reactions   Cozaar [losartan] Hives, Swelling   Angioedema   Keflex [cephalexin] Swelling, Rash   Penicillins Rash   Has patient had a PCN reaction causing immediate rash, facial/tongue/throat swelling, SOB or lightheadedness with hypotension: Yes Has patient had a PCN reaction causing severe rash involving mucus membranes or skin necrosis: No Has patient had a PCN reaction that required hospitalization: No Has patient had a PCN reaction occurring within the last 10 years: No If all of the above answers are "NO", then may proceed with Cephalosporin use.   Zestril [lisinopril] Hives, Other (See Comments)   Headache Low BP Angioedema    Hctz [hydrochlorothiazide] Swelling, Rash   Bee Venom Swelling   Metformin And Related Rash        Medication List     TAKE these medications    albuterol 108 (90 Base) MCG/ACT inhaler Commonly known as: VENTOLIN HFA Inhale 1-2 puffs into the lungs every 6 (six) hours as needed for wheezing or shortness of breath.   ALIVE MULTI-VITAMIN PO Take by mouth  daily in the afternoon.   amLODipine 5 MG tablet Commonly known as: NORVASC Take 1 tablet (5 mg total) by mouth daily.   benzonatate 200 MG capsule Commonly known as: TESSALON Take 1 capsule (200 mg total) by mouth 3 (three) times daily as needed for cough.   benztropine 1 MG tablet Commonly known as: COGENTIN Take 1 mg by mouth 2 (two) times daily.   buPROPion 150 MG 24 hr tablet Commonly known as: WELLBUTRIN XL Take 150 mg by mouth every morning.   celecoxib 200 MG capsule Commonly known as: CELEBREX Take 1 capsule by mouth 2 (two) times daily as needed (pain).   cetirizine 10 MG tablet Commonly known as: ZyrTEC Allergy Take 2 tablets (20 mg total) by mouth daily.   cyanocobalamin 1000 MCG tablet Commonly known as: VITAMIN B12 Take 1,000 mcg by mouth daily.   EPINEPHrine 0.3 mg/0.3 mL Soaj injection Commonly known as: EPI-PEN Inject 0.3 mg into the muscle as needed for anaphylaxis.   famotidine 40 MG tablet Commonly known as: PEPCID Take 1 tablet (40 mg total) by mouth daily.   fexofenadine 180 MG tablet Commonly known as: ALLEGRA Take 2 tablets (360 mg total) by mouth every morning. Prn only if needed either take zyrtec or allegra explain to patient   FLUoxetine 40 MG capsule Commonly known as: PROZAC Take 40 mg by mouth daily.   fluPHENAZine 2.5 MG tablet Commonly known as: PROLIXIN Take 2.5 mg by mouth 2 (two) times daily.   fluticasone 50 MCG/ACT nasal spray Commonly known as:  FLONASE Place 2 sprays into both nostrils daily.   furosemide 20 MG tablet Commonly known as: LASIX Take 0.5-1 tablets (10-20 mg total) by mouth daily as needed. What changed: how much to take   hydrocortisone 2.5 % ointment Apply topically 2 (two) times daily. Neck   hydrOXYzine 50 MG tablet Commonly known as: ATARAX Take 50 mg by mouth 4 (four) times daily as needed for anxiety.   metoprolol succinate 25 MG 24 hr tablet Commonly known as: TOPROL-XL Take 1 tablet (25 mg  total) by mouth daily.   montelukast 10 MG tablet Commonly known as: SINGULAIR Take 1 tablet (10 mg total) by mouth daily.   omalizumab 150 MG/ML prefilled syringe Commonly known as: XOLAIR Inject 300 mg into the skin every 28 (twenty-eight) days.   omeprazole 40 MG capsule Commonly known as: PRILOSEC Take 1 capsule (40 mg total) by mouth daily.   ondansetron 4 MG disintegrating tablet Commonly known as: ZOFRAN-ODT Take 1 tablet (4 mg total) by mouth every 8 (eight) hours as needed for nausea or vomiting.   oxyCODONE-acetaminophen 10-325 MG tablet Commonly known as: PERCOCET Take 1 tablet by mouth 3 (three) times daily as needed for pain.   QUEtiapine 400 MG 24 hr tablet Commonly known as: SEROQUEL XR Take 400 mg by mouth at bedtime.   scopolamine 1 MG/3DAYS Commonly known as: TRANSDERM-SCOP Place 1 patch (1.5 mg total) onto the skin every 3 (three) days. 4 hours before you get on boat   topiramate 100 MG tablet Commonly known as: TOPAMAX Take 100 mg by mouth at bedtime.   Victoza 18 MG/3ML Sopn Generic drug: liraglutide Inject 0.6 mg into the skin daily. X 1 week, 1.2 qd x week 2, 1.8 qd x week 3, 2.4 qd x week 4 and 3 mg qd x week 5 and beyond goal wt 180s What changed:  how much to take additional instructions        Follow-up Information     Kristina Bertone, Loel Lofty, DO Follow up in 10 day(s).   Specialty: Plastic Surgery Contact information: [place] [address] [phone]                 Signed: Wallace Going 04/30/2022, 4:29 PM

## 2022-05-01 ENCOUNTER — Encounter: Payer: Medicare Other | Admitting: Plastic Surgery

## 2022-05-01 ENCOUNTER — Ambulatory Visit (INDEPENDENT_AMBULATORY_CARE_PROVIDER_SITE_OTHER): Payer: Medicare Other | Admitting: Physician Assistant

## 2022-05-01 DIAGNOSIS — M793 Panniculitis, unspecified: Secondary | ICD-10-CM

## 2022-05-01 NOTE — Progress Notes (Signed)
Patient is a 56 year old female here for follow-up after infraumbilical panniculectomy with Dr. Marla Roe on 04/24/2022

## 2022-05-01 NOTE — Progress Notes (Signed)
Patient is a 56 year old female here for follow-up after infraumbilical panniculectomy with Dr. Marla Roe on 04/24/2022.  Postoperatively patient notes she has done very well.  She has not been documenting her output but notes that she has had approximately 150 to 200 cc of output daily.  She denies any significant pain but notes some minimal discomfort.  She denies any infectious symptoms including fever nausea or vomiting, she denies any redness or discharge.  On exam her abdominal incision is covered in honeycomb dressing, no surrounding redness warmth or fluid collections.  She has minimal bruising along the incision.  Serosanguineous output noted in drain, no surrounding redness or swelling at the drain site.  Overall the patient is doing very well postoperatively.  She has no issues or concerns at today's visit.  She continues to wear her binder as directed.  Wound care instructions given, continued avoidance of strenuous activity advised.  The patient will follow-up in 1 week in our office for reevaluation or sooner as needed.  She was given strict return precautions and verbalized understanding and agreement to today's plan had no further questions or concerns.  Chaperone present for exam.

## 2022-05-06 ENCOUNTER — Telehealth: Payer: Self-pay | Admitting: Internal Medicine

## 2022-05-06 ENCOUNTER — Encounter: Payer: Self-pay | Admitting: Internal Medicine

## 2022-05-06 ENCOUNTER — Ambulatory Visit (INDEPENDENT_AMBULATORY_CARE_PROVIDER_SITE_OTHER): Payer: Medicare Other | Admitting: Internal Medicine

## 2022-05-06 VITALS — BP 122/74 | HR 104 | Temp 97.8°F | Ht 63.0 in | Wt 225.8 lb

## 2022-05-06 DIAGNOSIS — G5601 Carpal tunnel syndrome, right upper limb: Secondary | ICD-10-CM

## 2022-05-06 DIAGNOSIS — B001 Herpesviral vesicular dermatitis: Secondary | ICD-10-CM

## 2022-05-06 DIAGNOSIS — Z720 Tobacco use: Secondary | ICD-10-CM | POA: Diagnosis not present

## 2022-05-06 MED ORDER — NICOTINE 14 MG/24HR TD PT24: 14.0000 mg | MEDICATED_PATCH | Freq: Every day | TRANSDERMAL | 0 refills | Status: DC

## 2022-05-06 MED ORDER — VALACYCLOVIR HCL 1 G PO TABS: 1000.0000 mg | ORAL_TABLET | Freq: Two times a day (BID) | ORAL | 3 refills | Status: DC

## 2022-05-06 MED ORDER — NICOTINE 7 MG/24HR TD PT24
7.0000 mg | MEDICATED_PATCH | Freq: Every day | TRANSDERMAL | 0 refills | Status: DC
Start: 2022-05-06 — End: 2022-11-05

## 2022-05-06 NOTE — Progress Notes (Signed)
Chief Complaint  Patient presents with   Abdominal Pain    Pt had a procedure about 3 wks ago on her stomach and she has a burning sensation and she feels it is draining blood too much   F/u  1. S/p panniculectomy and stomach drained 25 cc serosanguinous fluid since 7:30 this am and c/o burning sensation at the site of incision has ab binder on f/u surgery 05/09/22 otherwise doing well  Cc'ed surgery about pt complaints  2. Right hand 1st to 3rd fingers numb with h/o CTS given brace today and can f/u Dr. Amedeo Plenty surgery in the future  3. Chewing tobacco and wants nicotine patch  4. H/o fever blister rec valtrex bid prn x 7 days with outbreak   Review of Systems  Constitutional:  Negative for weight loss.  HENT:  Negative for hearing loss.   Eyes:  Negative for blurred vision.  Respiratory:  Negative for shortness of breath.   Cardiovascular:  Negative for chest pain.  Gastrointestinal:  Negative for abdominal pain and blood in stool.  Genitourinary:  Negative for dysuria.  Musculoskeletal:  Negative for falls and joint pain.  Skin:  Negative for rash.  Neurological:  Positive for sensory change. Negative for headaches.  Psychiatric/Behavioral:  Negative for depression.    Past Medical History:  Diagnosis Date   Allergic rhinitis    Anxiety    Arthritis of knee, right    Asthma    Bipolar disorder ([place])    Chronic low back pain    CTS (carpal tunnel syndrome)    hands   Depression    Gallstones    GERD (gastroesophageal reflux disease)    History of prediabetes    Hypertension    Laceration of right foot    Onychomycosis    OSA on CPAP    does not use cpap   Overactive bladder    Pre-diabetes    diet controlled   PVC's (premature ventricular contractions)    PVC's (premature ventricular contractions)    Dr. Raliegh Ip cards   Seiz[facility])    8th grade   Past Surgical History:  Procedure Laterality Date   ABDOMINAL HYSTERECTOMY  07/08/2011   CARPAL TUNNEL RELEASE      right hand 1996   CESAREAN SECTION     2012   CHOLECYSTECTOMY N/A 01/12/2018   Procedure: LAPAROSCOPIC CHOLECYSTECTOMY WITH INTRAOPERATIVE CHOLANGIOGRAM ERAS PATHWAY;  Surgeon: Johnathan Hausen, MD;  Location: [facility];  Service: General;  Laterality: N/A;   COLONOSCOPY WITH PROPOFOL N/A 04/15/2019   Procedure: COLONOSCOPY WITH PROPOFOL;  Surgeon: Jonathon Bellows, MD;  Location: [facility] ENDOSCOPY;  Service: Gastroenterology;  Laterality: N/A;   DILATION AND CURETTAGE OF UTERUS     04/2011 benign endometrial polyp squamous metaplasia, inflamm, blood, mucous, benign    ESOPHAGOGASTRODUODENOSCOPY (EGD) WITH PROPOFOL N/A 10/23/2020   Procedure: ESOPHAGOGASTRODUODENOSCOPY (EGD) WITH PROPOFOL;  Surgeon: Jonathon Bellows, MD;  Location: [facility] ENDOSCOPY;  Service: Gastroenterology;  Laterality: N/A;   FLEXIBLE SIGMOIDOSCOPY N/A 02/15/2021   Procedure: FLEXIBLE SIGMOIDOSCOPY;  Surgeon: Jonathon Bellows, MD;  Location: [facility] ENDOSCOPY;  Service: Gastroenterology;  Laterality: N/A;   FOOT SURGERY     Dr. Prudence Davidson    LEEP     2006   PANNICULECTOMY N/A 04/24/2022   Procedure: INFRAUMBILICAL PANNICULECTOMY;  Surgeon: Wallace Going, DO;  Location: [facility];  Service: Plastics;  Laterality: N/A;   TUBAL LIGATION     Family History  Problem Relation Age of Onset   Heart failure Mother  Hypertension Mother    Early death Mother    Heart disease Mother        chf   Cancer Father        esophageal    Hypertension Father    Breast cancer Sister 8   Cancer Sister        breast   Depression Sister    Mental illness Sister    Asthma Son    Cancer Other        brain cancer   Cancer Paternal Aunt        brain cancer   Social History   Socioeconomic History   Marital status: Married    Spouse name: Not on file   Number of children: Not on file   Years of education: Not on file   Highest education level: Not on file  Occupational History   Not on file  Tobacco Use   Smoking status: Never   Smokeless tobacco:  Current    Types: Snuff   Tobacco comments:    Is in process of decreasing amount  Vaping Use   Vaping Use: Never used  Substance and Sexual Activity   Alcohol use: No    Alcohol/week: 0.0 standard drinks of alcohol   Drug use: No   Sexual activity: Not Currently    Birth control/protection: Surgical    Comment: Hysterectomy  Other Topics Concern   Not on file  Social History Narrative   Married    No guns    Wears seat belt    Safe in relationship    Never smoker    2 sons Gaspar Bidding ~10 and another son ~28 as of 10/2019   1 granson eli 1 y.o as of 10/2019   2 sisters    Unemployed       Social Determinants of Health   Financial Resource Strain: Low Risk  (09/13/2021)   Overall Financial Resource Strain (CARDIA)    Difficulty of Paying Living Expenses: Not hard at all  Food Insecurity: No Food Insecurity (02/25/2021)   Hunger Vital Sign    Worried About Running Out of Food in the Last Year: Never true    Ran Out of Food in the Last Year: Never true  Transportation Needs: No Transportation Needs (02/25/2021)   PRAPARE - Hydrologist (Medical): No    Lack of Transportation (Non-Medical): No  Physical Activity: Insufficiently Active (07/07/2019)   Exercise Vital Sign    Days of Exercise per Week: 3 days    Minutes of Exercise per Session: 30 min  Stress: No Stress Concern Present (10/01/2020)   [place]    Feeling of Stress : Not at all  Social Connections: Unknown (10/01/2020)   Social Connection and Isolation Panel [NHANES]    Frequency of Communication with Friends and Family: Not on file    Frequency of Social Gatherings with Friends and Family: Not on file    Attends Religious Services: Not on file    Active Member of Clubs or Organizations: Not on file    Attends Archivist Meetings: Not on file    Marital Status: Married  Intimate Partner Violence: Not At Risk  (02/25/2021)   Humiliation, Afraid, Rape, and Kick questionnaire    Fear of Current or Ex-Partner: No    Emotionally Abused: No    Physically Abused: No    Sexually Abused: No   Current Meds  Medication Sig  albuterol (VENTOLIN HFA) 108 (90 Base) MCG/ACT inhaler Inhale 1-2 puffs into the lungs every 6 (six) hours as needed for wheezing or shortness of breath.   amLODipine (NORVASC) 5 MG tablet Take 1 tablet (5 mg total) by mouth daily.   benzonatate (TESSALON) 200 MG capsule Take 1 capsule (200 mg total) by mouth 3 (three) times daily as needed for cough.   benztropine (COGENTIN) 1 MG tablet Take 1 mg by mouth 2 (two) times daily.   buPROPion (WELLBUTRIN XL) 150 MG 24 hr tablet Take 150 mg by mouth every morning.   celecoxib (CELEBREX) 200 MG capsule Take 1 capsule by mouth 2 (two) times daily as needed (pain).   cetirizine (ZYRTEC ALLERGY) 10 MG tablet Take 2 tablets (20 mg total) by mouth daily.   EPINEPHrine 0.3 mg/0.3 mL IJ SOAJ injection Inject 0.3 mg into the muscle as needed for anaphylaxis.   famotidine (PEPCID) 40 MG tablet Take 1 tablet (40 mg total) by mouth daily.   fexofenadine (ALLEGRA) 180 MG tablet Take 2 tablets (360 mg total) by mouth every morning. Prn only if needed either take zyrtec or allegra explain to patient   FLUoxetine (PROZAC) 40 MG capsule Take 40 mg by mouth daily.   fluPHENAZine (PROLIXIN) 2.5 MG tablet Take 2.5 mg by mouth 2 (two) times daily.   fluticasone (FLONASE) 50 MCG/ACT nasal spray Place 2 sprays into both nostrils daily.   furosemide (LASIX) 20 MG tablet Take 0.5-1 tablets (10-20 mg total) by mouth daily as needed. (Patient taking differently: Take 20 mg by mouth daily as needed.)   hydrocortisone 2.5 % ointment Apply topically 2 (two) times daily. Neck   hydrOXYzine (ATARAX) 50 MG tablet Take 50 mg by mouth 4 (four) times daily as needed for anxiety.   liraglutide (VICTOZA) 18 MG/3ML SOPN Inject 0.6 mg into the skin daily. X 1 week, 1.2 qd x week 2,  1.8 qd x week 3, 2.4 qd x week 4 and 3 mg qd x week 5 and beyond goal wt 180s (Patient taking differently: Inject 1.8 mg into the skin daily. Inject 1.8 mg daily for 1 week (pt currently taking this dose) Inject 2.4 mg daily for 1 week Inject 3.0 mg daily)   metoprolol succinate (TOPROL-XL) 25 MG 24 hr tablet Take 1 tablet (25 mg total) by mouth daily.   montelukast (SINGULAIR) 10 MG tablet Take 1 tablet (10 mg total) by mouth daily.   Multiple Vitamins-Minerals (ALIVE MULTI-VITAMIN PO) Take by mouth daily in the afternoon.   nicotine (NICODERM CQ - DOSED IN MG/24 HOURS) 14 mg/24hr patch Place 1 patch (14 mg total) onto the skin daily. X 45 days then go down to 7 mg patch   nicotine (NICODERM CQ - DOSED IN MG/24 HR) 7 mg/24hr patch Place 1 patch (7 mg total) onto the skin daily. After using 14 mg patch   omalizumab (XOLAIR) 150 MG/ML prefilled syringe Inject 300 mg into the skin every 28 (twenty-eight) days.   omeprazole (PRILOSEC) 40 MG capsule Take 1 capsule (40 mg total) by mouth daily.   ondansetron (ZOFRAN-ODT) 4 MG disintegrating tablet Take 1 tablet (4 mg total) by mouth every 8 (eight) hours as needed for nausea or vomiting.   oxyCODONE-acetaminophen (PERCOCET) 10-325 MG tablet Take 1 tablet by mouth 3 (three) times daily as needed for pain.   QUEtiapine (SEROQUEL XR) 400 MG 24 hr tablet Take 400 mg by mouth at bedtime.   scopolamine (TRANSDERM-SCOP) 1 MG/3DAYS Place 1 patch (1.5 mg  total) onto the skin every 3 (three) days. 4 hours before you get on boat   topiramate (TOPAMAX) 100 MG tablet Take 100 mg by mouth at bedtime.   vitamin B-12 (CYANOCOBALAMIN) 1000 MCG tablet Take 1,000 mcg by mouth daily.   [DISCONTINUED] valACYclovir (VALTREX) 1000 MG tablet Take 1 tablet by mouth twice daily for 7 days   Allergies  Allergen Reactions   Cozaar [Losartan] Hives and Swelling    Angioedema   Keflex [Cephalexin] Swelling and Rash   Penicillins Rash    Has patient had a PCN reaction causing  immediate rash, facial/tongue/throat swelling, SOB or lightheadedness with hypotension: Yes Has patient had a PCN reaction causing severe rash involving mucus membranes or skin necrosis: No Has patient had a PCN reaction that required hospitalization: No Has patient had a PCN reaction occurring within the last 10 years: No If all of the above answers are "NO", then may proceed with Cephalosporin use.    Zestril [Lisinopril] Hives and Other (See Comments)    Headache Low BP Angioedema     Hctz [Hydrochlorothiazide] Swelling and Rash   Bee Venom Swelling   Metformin And Related Rash   Recent Results (from the past 2160 hour(s))  Protein Electrophoresis, (serum)     Status: Abnormal   Collection Time: 02/11/22 11:35 AM  Result Value Ref Range   Total Protein 6.0 (L) 6.1 - 8.1 g/dL   Albumin ELP 3.6 (L) 3.8 - 4.8 g/dL   Alpha 1 0.3 0.2 - 0.3 g/dL   Alpha 2 0.7 0.5 - 0.9 g/dL   Beta Globulin 0.4 0.4 - 0.6 g/dL   Beta 2 0.4 0.2 - 0.5 g/dL   Gamma Globulin 0.8 0.8 - 1.7 g/dL   SPE Interp.      Comment: . One or more serum protein fractions are outside the normal ranges.  No abnormal protein bands are apparent. .   TSH     Status: None   Collection Time: 02/11/22 11:35 AM  Result Value Ref Range   TSH 1.49 0.35 - 5.50 uIU/mL  CBC with Differential/Platelet     Status: Abnormal   Collection Time: 02/11/22 11:35 AM  Result Value Ref Range   WBC 4.8 4.0 - 10.5 K/uL   RBC 3.88 3.87 - 5.11 Mil/uL   Hemoglobin 11.1 (L) 12.0 - 15.0 g/dL   HCT 32.4 (L) 36.0 - 46.0 %   MCV 83.4 78.0 - 100.0 fl   MCHC 34.3 30.0 - 36.0 g/dL   RDW 17.4 (H) 11.5 - 15.5 %   Platelets 184.0 150.0 - 400.0 K/uL   Neutrophils Relative % 37.7 (L) 43.0 - 77.0 %   Lymphocytes Relative 49.4 (H) 12.0 - 46.0 %   Monocytes Relative 8.4 3.0 - 12.0 %   Eosinophils Relative 3.5 0.0 - 5.0 %   Basophils Relative 1.0 0.0 - 3.0 %   Neutro Abs 1.8 1.4 - 7.7 K/uL   Lymphs Abs 2.4 0.7 - 4.0 K/uL   Monocytes Absolute 0.4  0.1 - 1.0 K/uL   Eosinophils Absolute 0.2 0.0 - 0.7 K/uL   Basophils Absolute 0.1 0.0 - 0.1 K/uL  IBC + Ferritin     Status: Abnormal   Collection Time: 02/11/22 11:35 AM  Result Value Ref Range   Iron 97 42 - 145 ug/dL   Transferrin 192.0 (L) 212.0 - 360.0 mg/dL   Saturation Ratios 36.1 20.0 - 50.0 %   Ferritin 309.3 (H) 10.0 - 291.0 ng/mL   TIBC 268.8 250.0 -  450.0 mcg/dL  Lactate dehydrogenase     Status: None   Collection Time: 02/11/22 11:35 AM  Result Value Ref Range   LDH 123 120 - 250 U/L  Protein Electrophoresis, Urine Rflx.     Status: None   Collection Time: 02/11/22 11:36 AM  Result Value Ref Range   Protein, Ur 8.8 Not Estab. mg/dL   Albumin ELP, Urine 100.0 %   Alpha-1-Globulin, U 0.0 %   Alpha-2-Globulin, U 0.0 %   Beta Globulin, U 0.0 %   Gamma Globulin, U 0.0 %   M Component, Ur Not Observed Not Observed %   Please Note: Comment     Comment: Protein electrophoresis scan will follow via computer, mail, or courier delivery.   B12 and Folate Panel     Status: Abnormal   Collection Time: 03/04/22  3:08 PM  Result Value Ref Range   Vitamin B-12 >2000 (H) 232 - 1245 pg/mL   Folate 16.7 >3.0 ng/mL    Comment: A serum folate concentration of less than 3.1 ng/mL is considered to represent clinical deficiency.   Copper, serum     Status: None   Collection Time: 03/04/22  3:08 PM  Result Value Ref Range   Copper 108 80 - 158 ug/dL    Comment:                                 Detection Limit = 5  Hemoglobinopathy evaluation     Status: None   Collection Time: 03/04/22  3:50 PM  Result Value Ref Range   Hgb F Reflexed 0.0 - 2.0 %   Hgb A Reflexed 96.4 - 98.8 %   Hgb A2 Reflexed 1.8 - 3.2 %   Hgb S Reflexed 0.0 %   Interpretation, Hgb Fract Comment     Comment: Capillary Electrophoresis (CE) performed. Reflex to High-pressure liquid chromatography (HPLC) method for confirmation.   Hgb Fractionation by HPLC     Status: Abnormal   Collection Time: 03/04/22  3:50  PM  Result Value Ref Range   Hgb F 0.0 0.0 - 2.0 %   Hgb A 59.3 (L) 96.4 - 98.8 %   Hgb A2 3.3 (H) 1.8 - 3.2 %   Hgb S 0.0 0.0 %   Hgb C 37.4 (H) 0.0 %   Hgb E 0.0 0.0 %   Hgb Variant 0.0 0.0 %   Final Interpretation: Comment     Comment: Hemoglobin pattern and concentration are consistent with Hgb C trait (heterozygous). Suggest clinical and hematologic correlation.             Hgb C-Trait Interpretation Ranges             Hgb A      50.0 - 70.0%             Hgb C      30.0 - 45.0%             Hgb A2      2.0 -  4.5%   Comprehensive metabolic panel     Status: Abnormal   Collection Time: 03/04/22  3:53 PM  Result Value Ref Range   Glucose 96 70 - 99 mg/dL   BUN 8 6 - 24 mg/dL   Creatinine, Ser 1.39 (H) 0.57 - 1.00 mg/dL   eGFR 45 (L) >59 mL/min/1.73   BUN/Creatinine Ratio 6 (L) 9 - 23   Sodium 143  134 - 144 mmol/L   Potassium 4.2 3.5 - 5.2 mmol/L   Chloride 106 96 - 106 mmol/L   CO2 23 20 - 29 mmol/L   Calcium 9.2 8.7 - 10.2 mg/dL   Total Protein 6.6 6.0 - 8.5 g/dL   Albumin 4.3 3.8 - 4.9 g/dL   Globulin, Total 2.3 1.5 - 4.5 g/dL   Albumin/Globulin Ratio 1.9 1.2 - 2.2   Bilirubin Total 0.2 0.0 - 1.2 mg/dL   Alkaline Phosphatase 77 44 - 121 IU/L   AST 13 0 - 40 IU/L   ALT 16 0 - 32 IU/L  Comprehensive metabolic panel     Status: Abnormal   Collection Time: 03/07/22 11:14 AM  Result Value Ref Range   Sodium 140 135 - 145 mEq/L   Potassium 3.9 3.5 - 5.1 mEq/L   Chloride 108 96 - 112 mEq/L   CO2 26 19 - 32 mEq/L   Glucose, Bld 100 (H) 70 - 99 mg/dL   BUN 13 6 - 23 mg/dL   Creatinine, Ser 1.21 (H) 0.40 - 1.20 mg/dL   Total Bilirubin 0.3 0.2 - 1.2 mg/dL   Alkaline Phosphatase 67 39 - 117 U/L   AST 14 0 - 37 U/L   ALT 14 0 - 35 U/L   Total Protein 5.9 (L) 6.0 - 8.3 g/dL   Albumin 3.7 3.5 - 5.2 g/dL   GFR 51.08 (L) >60.00 mL/min    Comment: Calculated using the CKD-EPI Creatinine Equation (2021)   Calcium 8.9 8.4 - 10.5 mg/dL  Basic metabolic panel per protocol      Status: None   Collection Time: 04/24/22 12:20 PM  Result Value Ref Range   Sodium 141 135 - 145 mmol/L   Potassium 4.0 3.5 - 5.1 mmol/L   Chloride 109 98 - 111 mmol/L   CO2 24 22 - 32 mmol/L   Glucose, Bld 95 70 - 99 mg/dL    Comment: Glucose reference range applies only to samples taken after fasting for at least 8 hours.   BUN 8 6 - 20 mg/dL   Creatinine, Ser 0.96 0.44 - 1.00 mg/dL   Calcium 9.2 8.9 - 10.3 mg/dL   GFR, Estimated >60 >60 mL/min    Comment: (NOTE) Calculated using the CKD-EPI Creatinine Equation (2021)    Anion gap 8 5 - 15    Comment: Performed at [facility] [address] [place], [place] [address]  CBC per protocol     Status: Abnormal   Collection Time: 04/24/22 12:20 PM  Result Value Ref Range   WBC 5.2 4.0 - 10.5 K/uL   RBC 3.82 (L) 3.87 - 5.11 MIL/uL   Hemoglobin 11.4 (L) 12.0 - 15.0 g/dL   HCT 31.5 (L) 36.0 - 46.0 %   MCV 82.5 80.0 - 100.0 fL   MCH 29.8 26.0 - 34.0 pg   MCHC 36.2 (H) 30.0 - 36.0 g/dL   RDW 14.7 11.5 - 15.5 %   Platelets 204 150 - 400 K/uL   nRBC 0.0 0.0 - 0.2 %    Comment: Performed at [facility], [place] [address] [place], [place] [address]  Surgical pathology     Status: None   Collection Time: 04/24/22  3:01 PM  Result Value Ref Range   SURGICAL PATHOLOGY      SURGICAL PATHOLOGY CASE: [id number] PATIENT: Dhruti Sam Surgical Pathology Report     Clinical History: panniculitis (cm)     FINAL MICROSCOPIC DIAGNOSIS:  A.  PANNICULUS, PANNICULECTOMY: -  Unremarkable skin and fragments of adipose tissue with no significant pathology.   GROSS DESCRIPTION:  Received fresh is a 1533 g, 55 x 12 cm elliptical tan-brown unremarkable skin with underlying soft lobulated fatty tissue up to 6 cm in depth. Also included with the specimen are several separate soft lobulated fatty tissues, 7.6 x 5.6 x 2.2 cm in aggregate.  Sections through the skin with underlying fatty tissue and separate fatty tissues  are unremarkable.  Representative sections are submitted 1 block.  SW 04/25/2022   Final Diagnosis performed by Tilford Pillar DO.   Electronically signed 04/28/2022 Technical component performed at [employer]. [facility], [place] [address], [place], [place] [address].  Professional component performed at [facility], [place] [address] [place], [place] [address].  Immunohistochemistry Technical component (if applicable) was performed at [facility]. [address] [place], [place], [place] [address].   IMMUNOHISTOCHEMISTRY DISCLAIMER (if applicable): Some of these immunohistochemical stains may have been developed and the performance characteristics determine by [facility]. Some may not have been cleared or approved by the U.S. Food and Drug Administration. The FDA has determined that such clearance or approval is not necessary. This test is used for clinical purposes. It should not be regarded as investigational or for research. This laboratory is certified under the [place] (CLIA-88) as qualified to perform high complexity clinical laboratory testing.  The controls stained appropriately.    Objective  Body mass index is 40 kg/m. Wt Readings from Last 3 Encounters:  05/06/22 225 lb 12.8 oz (102.4 kg)  04/24/22 212 lb (96.2 kg)  04/14/22 217 lb 6.4 oz (98.6 kg)   Temp Readings from Last 3 Encounters:  05/06/22 97.8 F (36.6 C) (Oral)  04/24/22 (!) 97.5 F (36.4 C)  04/04/22 98 F (36.7 C) (Oral)   BP Readings from Last 3 Encounters:  05/06/22 122/74  04/24/22 (!) 150/93  04/14/22 118/73   Pulse Readings from Last 3 Encounters:  05/06/22 (!) 104  04/24/22 68  04/14/22 92    Physical Exam Vitals and nursing note reviewed.  Constitutional:      Appearance: Normal appearance. She is well-developed and well-groomed.  HENT:     Head: Normocephalic and atraumatic.   Eyes:     Conjunctiva/sclera: Conjunctivae normal.     Pupils: Pupils are equal, round, and reactive to light.  Cardiovascular:     Rate and Rhythm: Normal rate and regular rhythm.     Heart sounds: Normal heart sounds. No murmur heard. Pulmonary:     Effort: Pulmonary effort is normal.     Breath sounds: Normal breath sounds.  Abdominal:     General: Abdomen is flat. Bowel sounds are normal.     Tenderness: There is no abdominal tenderness.  Musculoskeletal:        General: No tenderness.  Skin:    General: Skin is warm and dry.  Neurological:     General: No focal deficit present.     Mental Status: She is alert and oriented to person, place, and time. Mental status is at baseline.     Cranial Nerves: Cranial nerves 2-12 are intact.     Motor: Motor function is intact.     Coordination: Coordination is intact.     Gait: Gait is intact.  Psychiatric:        Attention and Perception: Attention and perception normal.  Mood and Affect: Mood and affect normal.        Speech: Speech normal.        Behavior: Behavior normal. Behavior is cooperative.        Thought Content: Thought content normal.        Cognition and Memory: Cognition and memory normal.        Judgment: Judgment normal.     Assessment  Plan  Carpal tunnel syndrome of right wrist Rx right wrist brace   Fever blister - Plan: valACYclovir (VALTREX) 1000 MG tablet bid x 7 days prn  Smokeless tobacco use - Plan: nicotine (NICODERM CQ - DOSED IN MG/24 HOURS) 14 mg/24hr patch, nicotine (NICODERM CQ - DOSED IN MG/24 HR) 7 mg/24hr patch   HM Flu shot utd Tdap rx per pt had 11/24/21 [place] 2/2 consider booster rec  Prevnar today given Consider shingrix future rx 11/24/21  Not immune MMR, hep B titer consider in the future  915/21 house calls visit A1c 6.1    h/o supracervical hysterectomy ([facility]), pap 02/29/16 negative no comment HPV, another pap 06/23/17 neg pap neg HPV -h/o LEEP 2006 11/29/19 negative  pap    mammo 08/04/19 negative ordered 08/22/20 negative ordered 2022  08/05/21 negative ordered 07/2022    Colonoscopy 04/15/19 normal f/u in 10 years egd sch 10/23/20 Dr. Vicente Males neg bxs 10/23/20 aperistalsis and food + consider gastric emptying study and esophageal mamometry  10/25/20 Dr. Bailey Mech  Dr Jacklynn Lewis,    Echo Dr. Nehemiah Massed -nl LV function EF 60% moderate mitral insuff and mild tricuspid insufficiency treadmill stress test neg 07/30/13  -h/o PVCS   Hep C neg 08/17/20 HIV neg 02/04/16 Rec healthy diet and exercise    Ob/gyn appt pending bladder leakage I.e bladder sling vs other [facility] in PT (stopped PT w/o help worsen sneezing or coughing)  -Dr. Georgianne Fick pt wants surgery as of 04/20/20 sx's better and no surgery indicated    Consider referral repeat sleep study h/o OSA f/u Dr. Kathyrn Sheriff and will get procedure for OSA as of 04/17/21   Provider: Dr. Olivia Mackie McLean-Scocuzza-Internal Medicine

## 2022-05-06 NOTE — Patient Instructions (Addendum)
Non nicotine chewing tobacco  Biotene mouthwash for dry mouth otc  Dr. Volanda Napoleon will be your new PCP   The best hand surgeon in [facility]   Dr. Amedeo Plenty for carpel [facility] surgery    Phone Fax E-mail Address  [phone] Not available [address]   [address]   [place] [address]     Specialties     Orthopedic Surgery       Smokeless Tobacco Information, Adult  Tobacco is a leafy plant that contains a chemical called nicotine. Tobacco use is harmful to your health. Nicotine affects the chemicals in your brain, and this can cause you to crave nicotine. These cravings can lead to addiction. Smokeless tobacco is tobacco that you put in your mouth instead of smoking it. It may also be called chewing tobacco or snuff. Smokeless tobacco is made from the leaves of tobacco plants and comes in many forms, such as: Loose, dry leaves. Plugs or twists. Moist pouches. Dissolving lozenges or strips. Chewing, sucking, or holding the tobacco in your mouth causes your mouth to make more saliva. The saliva mixes with the tobacco to make tobacco "juice" that is swallowed or spit out. How can smokeless tobacco use affect me? All forms of tobacco contain many chemicals that can harm every organ in the body. Using smokeless tobacco increases your risk for: Cancer. Tobacco use is one of the leading causes of cancer. Smokeless tobacco is linked to cancer of the mouth, esophagus, and pancreas. Other long-term health problems, including high blood pressure, heart disease, and stroke. Addiction. Pregnancy complications. Pregnant women who use smokeless tobacco are more likely to have a miscarriage or deliver a baby too early (premature delivery). Mouth and dental problems, such as: Bad breath. Teeth that look yellow or brown. Mouth sores. Cracking and bleeding lips. Gum recession, gum disease, or tooth decay. Lesions on the soft tissues of your mouth (leukoplakia). The benefits of not  using smokeless tobacco include: A healthy mind and body. Saving money. You avoid the cost of buying tobacco and the cost of treating illnesses that are caused by tobacco. What actions can I take to quit using tobacco? Ask your health care provider for help to quit using smokeless tobacco. This may involve treatment. These tips may also help you quit: Pick a date to quit. Set a date within the next 2 weeks. This gives you time to prepare. Write down the reasons why you are quitting. Keep this list in places where you will see it often, such as on your bathroom mirror or in your car or wallet. Identify the people, places, things, and activities that make you want to use smokeless tobacco (triggers). Avoid them. Tell your family and friends that you are quitting. Look for a support group in your area. Having support can make quitting easier. Get rid of any tobacco you have and remove any tobacco smells. To do this: Throw away all containers of tobacco at home, at work, and in your car. Throw away any other items that you use regularly when you chew tobacco. Clean your car and make sure to remove all tobacco-related items. Clean your home, including curtains and carpets. Keep track of how many days have passed since you quit. It may help to cross days off a calendar. Remembering how long and hard you have worked to quit can help you avoid using smokeless tobacco again. Stay positive. Be prepared for cravings. It is common to slip up when you first quit, so  take it one day at a time. Stay busy and take care of your body. Get plenty of exercise. Drink enough water to keep your urine pale yellow. Where to find support Ask your health care provider if there is a local support group for quitting smokeless tobacco and any available online resources, such as: Smoke Free: www.veterans.smokefree.gov Be Tobacco Free: http://mullen.biz/ Tobacco Quitline: 1-855-QUIT-VET 412-635-9981). Hotlines,  such as 1-800-QUIT-NOW (862) 061-3595). Where to find more information You can learn more about the risks of using smokeless tobacco and the benefits of quitting from these sources: [place]: www.cancer.[place]: www.cancer.gov Centers for Disease Control and Prevention: http://www.wolf.info/ Food and Drug Administration: VCFever.ch Contact a health care provider if you have: Trouble quitting smokeless tobacco use on your own. White or other discolored patches in your mouth. Difficulty swallowing. A change in your voice. Unexplained weight loss. Stomach pain, nausea, or vomiting. Summary Smokeless tobacco contains many different chemicals that are known to cause cancer. Nicotine is an addictive chemical in smokeless tobacco that affects your brain. The benefits of not using smokeless tobacco include having a healthy mind and body and saving money. Tell your family and friends that you are quitting. Having support can make quitting easier. Ask your health care provider for help quitting smokeless tobacco. This may involve treatment. This information is not intended to replace advice given to you by your health care provider. Make sure you discuss any questions you have with your health care provider. Document Revised: 06/19/2021 Document Reviewed: 06/19/2021 Elsevier Patient Education  [place].

## 2022-05-06 NOTE — Telephone Encounter (Signed)
Spoke with  patient spouse he stated to call her back later this week

## 2022-05-07 NOTE — Progress Notes (Signed)
This is a pleasant 56 year old female here for follow-up status post infraumbilical panniculectomy with Dr. Marla Roe on 04/24/2022.  The patient was most recently seen in the office on 05/01/2022.  At that time she had been doing very well.  Her drains were putting out approximately 150 to 100 cc daily.  Her incision was covered in honeycomb dressing with no signs of infection.  The patient follows up today for reevaluation, she notes that she has been doing well overall.  She denies any signs or symptoms of infection including redness, warmth, discharge, fever chills nausea or vomiting.  She notes that she has been attending to the drain but did have some difficulty describing how much output she was putting out.  Originally she noted that she was emptying her drain twice per day with approximately 75 to 100 cc, but then noted that every couple hours she does have to empty it.  She is not recording her outputs and is pouring it into a nonmeasured cup.   On exam she is doing very well her abdomen is soft nontender with no fluctuance or obvious fluid collections.  Her honeycomb dressing is in place with no surrounding redness, it is clean dry and intact.  Her drain site is clean dry and intact.  Her JP drain has serosanguineous output.  Overall the patient is doing well postoperatively.  I did have a discussion of the patient on accurately measuring her drain output so that we can guide her care.  She will continue to monitor the output and measure it noting daytime in amount.  She does have a follow-up appointment with [place] next week, she will follow-up at that time for reevaluation.  In the meantime if she develops any new or worsening signs or symptoms including redness, swelling, warmth, fever, chills, nausea or vomiting, or increased output from her drain she will reach out to [place] immediately.  The patient verbalized understanding and agreement to this plan had no further questions or concerns.  Chaperone was  present throughout my exam.

## 2022-05-07 NOTE — Progress Notes (Deleted)
   Referring Provider McLean-Scocuzza, Nino Glow, MD [place],  [place] [address]   CC: No chief complaint on file.     Heather Sweeney is an 56 y.o. female.  HPI: ***  Review of Systems General: ***  Physical Exam    05/06/2022    8:30 AM 04/24/2022    5:00 PM 04/24/2022    4:40 PM  Vitals with BMI  Height '5\' 3"'$     Weight 225 lbs 13 oz    BMI 07.68    Systolic 088 110 315  Diastolic 74 93 86  Pulse 945 68 81    General:  No acute distress,  Alert and oriented, Non-Toxic, Normal speech and affect ***   Assessment/Plan ***  Heather Sweeney 05/07/2022, 9:55 AM

## 2022-05-09 ENCOUNTER — Ambulatory Visit (INDEPENDENT_AMBULATORY_CARE_PROVIDER_SITE_OTHER): Payer: Medicare Other | Admitting: Physician Assistant

## 2022-05-09 DIAGNOSIS — M793 Panniculitis, unspecified: Secondary | ICD-10-CM

## 2022-05-13 DIAGNOSIS — M5451 Vertebrogenic low back pain: Secondary | ICD-10-CM | POA: Diagnosis not present

## 2022-05-13 DIAGNOSIS — M47816 Spondylosis without myelopathy or radiculopathy, lumbar region: Secondary | ICD-10-CM | POA: Diagnosis not present

## 2022-05-13 DIAGNOSIS — Z5181 Encounter for therapeutic drug level monitoring: Secondary | ICD-10-CM | POA: Diagnosis not present

## 2022-05-13 DIAGNOSIS — G894 Chronic pain syndrome: Secondary | ICD-10-CM | POA: Diagnosis not present

## 2022-05-13 DIAGNOSIS — Z79891 Long term (current) use of opiate analgesic: Secondary | ICD-10-CM | POA: Diagnosis not present

## 2022-05-13 DIAGNOSIS — M25569 Pain in unspecified knee: Secondary | ICD-10-CM | POA: Diagnosis not present

## 2022-05-13 DIAGNOSIS — M797 Fibromyalgia: Secondary | ICD-10-CM | POA: Diagnosis not present

## 2022-05-14 DIAGNOSIS — R8290 Unspecified abnormal findings in urine: Secondary | ICD-10-CM | POA: Insufficient documentation

## 2022-05-14 DIAGNOSIS — E1122 Type 2 diabetes mellitus with diabetic chronic kidney disease: Secondary | ICD-10-CM | POA: Diagnosis not present

## 2022-05-14 DIAGNOSIS — G4733 Obstructive sleep apnea (adult) (pediatric): Secondary | ICD-10-CM | POA: Diagnosis not present

## 2022-05-14 DIAGNOSIS — D631 Anemia in chronic kidney disease: Secondary | ICD-10-CM | POA: Diagnosis not present

## 2022-05-14 DIAGNOSIS — F319 Bipolar disorder, unspecified: Secondary | ICD-10-CM | POA: Insufficient documentation

## 2022-05-14 DIAGNOSIS — N1831 Chronic kidney disease, stage 3a: Secondary | ICD-10-CM | POA: Diagnosis not present

## 2022-05-14 DIAGNOSIS — K219 Gastro-esophageal reflux disease without esophagitis: Secondary | ICD-10-CM | POA: Diagnosis not present

## 2022-05-14 DIAGNOSIS — I10 Essential (primary) hypertension: Secondary | ICD-10-CM | POA: Diagnosis not present

## 2022-05-15 ENCOUNTER — Ambulatory Visit (INDEPENDENT_AMBULATORY_CARE_PROVIDER_SITE_OTHER): Payer: Medicare Other | Admitting: Surgical

## 2022-05-15 ENCOUNTER — Encounter: Payer: Medicare Other | Admitting: Surgical

## 2022-05-15 DIAGNOSIS — M793 Panniculitis, unspecified: Secondary | ICD-10-CM

## 2022-05-15 NOTE — Progress Notes (Signed)
56 year old female here for follow-up after infraumbilical panniculectomy with Dr. Marla Roe on 04/24/2022.  She is 3 weeks postop.  She reports overall she is doing well, she presents today with no specific concerns.  She does report that she has been recording the drain output, upon examination of her recordings, she has had approximately 100+ cc of output over the past few days from the right JP drain, but there does seem to be some inconsistency in recording based on her notes.  She reports that she is not having any infectious symptoms.    She does report that this a.m. she emptied 75 cc at 9 AM and then an additional 40 cc at 10 AM.  Chaperone present on exam On exam honeycomb dressings are in place, foul odor is noted from the dressings.  She is ambulating normally throughout the exam room.  She is bending without any pain.  There is no erythema or cellulitic changes of her abdominal incision.  She has minimal tenderness to her abdomen.  Right JP drain is in place, the silk suture is present, but as she is ambulating through the office she is allowing the drain to drop to the ground frequently which is causing increased tension on the incision.  She does have some mild separation of the incision along the right lateral aspect.  I do not appreciate any signs of infection on exam, we discussed proper recording of drain output is necessary to ensure the drain is removed at the correct time to decrease risk of postoperative seroma.  We explicitly discussed dressing changes to the right abdominal wound with Vaseline and gauze daily.  We discussed scheduling a follow-up next week for reevaluation, call with questions or concerns.  Patient is agreeable to the plan and all of her questions were answered to her content.  She had some questions about obtaining her surgical records for her pain management doctor, she is going to sign a ROI.

## 2022-05-16 ENCOUNTER — Encounter: Payer: Medicare Other | Admitting: Plastic Surgery

## 2022-05-16 ENCOUNTER — Other Ambulatory Visit: Payer: Self-pay | Admitting: Nephrology

## 2022-05-16 DIAGNOSIS — E1122 Type 2 diabetes mellitus with diabetic chronic kidney disease: Secondary | ICD-10-CM

## 2022-05-16 DIAGNOSIS — D631 Anemia in chronic kidney disease: Secondary | ICD-10-CM

## 2022-05-16 DIAGNOSIS — N1831 Chronic kidney disease, stage 3a: Secondary | ICD-10-CM

## 2022-05-17 ENCOUNTER — Other Ambulatory Visit: Payer: Self-pay

## 2022-05-17 ENCOUNTER — Emergency Department
Admission: EM | Admit: 2022-05-17 | Discharge: 2022-05-17 | Disposition: A | Discharge status: Home / Self Care | Payer: Medicare Other | Attending: Emergency Medicine | Admitting: Emergency Medicine

## 2022-05-17 ENCOUNTER — Encounter (HOSPITAL_COMMUNITY): Payer: Self-pay

## 2022-05-17 ENCOUNTER — Encounter: Payer: Self-pay | Admitting: Intensive Care

## 2022-05-17 DIAGNOSIS — T85698A Other mechanical complication of other specified internal prosthetic devices, implants and grafts, initial encounter: Secondary | ICD-10-CM | POA: Diagnosis not present

## 2022-05-17 DIAGNOSIS — N189 Chronic kidney disease, unspecified: Secondary | ICD-10-CM | POA: Diagnosis not present

## 2022-05-17 DIAGNOSIS — Z48817 Encounter for surgical aftercare following surgery on the skin and subcutaneous tissue: Secondary | ICD-10-CM | POA: Insufficient documentation

## 2022-05-17 DIAGNOSIS — Z9889 Other specified postprocedural states: Secondary | ICD-10-CM

## 2022-05-17 DIAGNOSIS — I129 Hypertensive chronic kidney disease with stage 1 through stage 4 chronic kidney disease, or unspecified chronic kidney disease: Secondary | ICD-10-CM | POA: Diagnosis not present

## 2022-05-17 LAB — COMPREHENSIVE METABOLIC PANEL
ALT: 13 U/L (ref 0–44)
AST: 16 U/L (ref 15–41)
Albumin: 3.4 g/dL — ABNORMAL LOW (ref 3.5–5.0)
Alkaline Phosphatase: 65 U/L (ref 38–126)
Anion gap: 6 (ref 5–15)
BUN: 11 mg/dL (ref 6–20)
CO2: 25 mmol/L (ref 22–32)
Calcium: 8.9 mg/dL (ref 8.9–10.3)
Chloride: 108 mmol/L (ref 98–111)
Creatinine, Ser: 1.06 mg/dL — ABNORMAL HIGH (ref 0.44–1.00)
GFR, Estimated: >60 mL/min (ref >60)
Glucose, Bld: 89 mg/dL (ref 70–99)
Potassium: 3.5 mmol/L (ref 3.5–5.1)
Sodium: 139 mmol/L (ref 135–145)
Total Bilirubin: 0.4 mg/dL (ref 0.3–1.2)
Total Protein: 7.3 g/dL (ref 6.5–8.1)

## 2022-05-17 LAB — CBC WITH DIFFERENTIAL/PLATELET
Abs Immature Granulocytes: 0.02 10*3/uL (ref 0.00–0.07)
Basophils Absolute: 0.1 10*3/uL (ref 0.0–0.1)
Basophils Relative: 1 %
Eosinophils Absolute: 0.2 10*3/uL (ref 0.0–0.5)
Eosinophils Relative: 3 %
HCT: 26.6 % — ABNORMAL LOW (ref 36.0–46.0)
Hemoglobin: 9.3 g/dL — ABNORMAL LOW (ref 12.0–15.0)
Immature Granulocytes: 0 %
Lymphocytes Relative: 21 %
Lymphs Abs: 1.6 10*3/uL (ref 0.7–4.0)
MCH: 28.4 pg (ref 26.0–34.0)
MCHC: 35 g/dL (ref 30.0–36.0)
MCV: 81.3 fL (ref 80.0–100.0)
Monocytes Absolute: 0.6 10*3/uL (ref 0.1–1.0)
Monocytes Relative: 8 %
Neutro Abs: 5.3 10*3/uL (ref 1.7–7.7)
Neutrophils Relative %: 67 %
Platelets: 288 10*3/uL (ref 150–400)
RBC: 3.27 MIL/uL — ABNORMAL LOW (ref 3.87–5.11)
RDW: 13.4 % (ref 11.5–15.5)
WBC: 7.9 10*3/uL (ref 4.0–10.5)
nRBC: 0 % (ref 0.0–0.2)

## 2022-05-17 MED ORDER — BACITRACIN ZINC 500 UNIT/GM EX OINT
TOPICAL_OINTMENT | Freq: Once | CUTANEOUS | Status: AC
Start: 2022-05-17 — End: 2022-05-17
  Administered 2022-05-17: 1 via TOPICAL
  Filled 2022-05-17: qty 0.9

## 2022-05-17 NOTE — ED Triage Notes (Signed)
Patient had stomach surgery July 20th with drainage tube placed. Reports tube came out this AM. Has follow up appointment coming up Tuesday to get tube out.

## 2022-05-17 NOTE — ED Provider Notes (Signed)
[facility] Provider Note    Event Date/Time   First MD Initiated Contact with Patient 05/17/22 1316     (approximate)   History   No chief complaint on file.   HPI  Heather Sweeney is a 56 y.o. female with a history of obesity, HTN, HLD, prediabetes, CKD, presents to the ER today with complaint of her JP drain being dislodged.  She reports she had a infraumbilical panniculectomy with Dr. Tedra Coupe him on 04/24/2022.  She has been emptying her JP drain every 2 hours, which is approximately full with 75 200 mL of serosanguineous drainage.      Physical Exam   Triage Vital Signs: ED Triage Vitals  Enc Vitals Group     BP 05/17/22 1242 (!) 155/88     Pulse Rate 05/17/22 1242 (!) 102     Resp 05/17/22 1242 18     Temp 05/17/22 1242 99.6 F (37.6 C)     Temp Source 05/17/22 1242 Oral     SpO2 05/17/22 1242 100 %     Weight 05/17/22 1243 220 lb (99.8 kg)     Height 05/17/22 1243 '5\' 3"'$  (1.6 m)     Head Circumference --      Peak Flow --      Pain Score 05/17/22 1243 0     Pain Loc --      Pain Edu? --      Excl. in [place]? --     Most recent vital signs: Vitals:   05/17/22 1242  BP: (!) 155/88  Pulse: (!) 102  Resp: 18  Temp: 99.6 F (37.6 C)  SpO2: 100%     General: Awake, obese, no distress.  CV:  Tachycardic.  Normal rhythm. Resp:  Normal effort.  CT PA bilaterally. Abd:  Soft, nontender Skin:  Incision to lower abdomen with evidence of routine healing.  She does have some slight dehiscence of the wound without drainage or infection noted.  JP drain site insertion with tube out 11 cm.  No significant drainage noted at this time.   ED Results / Procedures / Treatments   Labs  Labs Reviewed  CBC WITH DIFFERENTIAL/PLATELET - Abnormal; Notable for the following components:      Result Value   RBC 3.27 (*)    Hemoglobin 9.3 (*)    HCT 26.6 (*)    All other components within normal limits  COMPREHENSIVE METABOLIC PANEL - Abnormal;  Notable for the following components:   Creatinine, Ser 1.06 (*)    Albumin 3.4 (*)    All other components within normal limits    Critical Care performed: No    MEDICATIONS ORDERED IN ED: Medications - No data to display   IMPRESSION / MDM / [place] / ED COURSE  I reviewed the triage vital signs and the nursing notes.    Differential diagnosis includes, but is not limited to, dislodgment of JP drain status post panniculectomy  Patient's presentation is most consistent with acute, uncomplicated illness.  CBC does not show any evidence of leukocytosis, mildly worsening anemia but this would be expected in the postop period CMET does not show any evidence of kidney dysfunction or electrolyte abnormality I paged general surgery.  Dr. Christian Mate returned my call and advised me that this is not a general surgery situation and that I need to get in touch with plastic surgery at [facility]. [place], plastic surgery paged- Krista Blue returned my call and advised me  to pull out the JP drain, covered with ABD pad and tape We will have her follow-up with plastic surgery on Tuesday as previously scheduled  FINAL CLINICAL IMPRESSION(S) / ED DIAGNOSES   Final diagnoses:  Status post panniculectomy  Broken Jackson-Pratt drain, initial encounter     Rx / DC Orders   ED Discharge Orders     None        Note:  This document was prepared using Dragon voice recognition software and may include unintentional dictation errors.    Jearld Fenton, NP 05/17/22 1523    Naaman Plummer, MD 05/17/22 [phone]

## 2022-05-17 NOTE — Discharge Instructions (Addendum)
You were seen today for dislodgment of your JP drain.  This was removed in the ER.  You will notice drainage from the area.  Please cover with a dry dressing and tape.  Change as needed when wet.  Please follow-up with your plastic surgeon on Tuesday as previously scheduled.

## 2022-05-17 NOTE — ED Provider Triage Note (Signed)
Emergency Medicine Provider Triage Evaluation Note  Heather Sweeney , a 56 y.o. female  was evaluated in triage.  Pt complains of tube coming out of her abdomen this morning.  Patient had stomach surgery July 20 and a drainage tube was placed.  Patient has an appointment with the surgeon for Tuesday.  Review of Systems  Positive: Complains of feeling "cold and then hot" Negative: No nausea or vomiting  Physical Exam  There were no vitals taken for this visit. Gen:   Awake, no distress   Resp:  Normal effort  MSK:   Moves extremities without difficulty  Other:    Medical Decision Making  Medically screening exam initiated at 12:43 PM.  Appropriate orders placed.  Heather Sweeney was informed that the remainder of the evaluation will be completed by another provider, this initial triage assessment does not replace that evaluation, and the importance of remaining in the ED until their evaluation is complete.     Johnn Hai, PA-C 05/17/22 1250

## 2022-05-19 ENCOUNTER — Telehealth: Payer: Self-pay

## 2022-05-19 ENCOUNTER — Other Ambulatory Visit: Payer: Self-pay | Admitting: Internal Medicine

## 2022-05-19 DIAGNOSIS — E785 Hyperlipidemia, unspecified: Secondary | ICD-10-CM

## 2022-05-19 DIAGNOSIS — I10 Essential (primary) hypertension: Secondary | ICD-10-CM

## 2022-05-19 DIAGNOSIS — Z6837 Body mass index (BMI) 37.0-37.9, adult: Secondary | ICD-10-CM

## 2022-05-19 DIAGNOSIS — Z87898 Personal history of other specified conditions: Secondary | ICD-10-CM

## 2022-05-19 NOTE — Telephone Encounter (Signed)
I called and spoke with patient after being seen in ED for JP drain tube come out. Patient scheduled to see PCP, Dr. Olivia Mackie  8/23.

## 2022-05-20 ENCOUNTER — Ambulatory Visit (INDEPENDENT_AMBULATORY_CARE_PROVIDER_SITE_OTHER): Payer: Medicare Other | Admitting: Physician Assistant

## 2022-05-20 ENCOUNTER — Encounter: Payer: Self-pay | Admitting: Physician Assistant

## 2022-05-20 DIAGNOSIS — M793 Panniculitis, unspecified: Secondary | ICD-10-CM

## 2022-05-20 NOTE — Progress Notes (Signed)
This is a 56 year old female seen in our office for postop follow-up status post infraumbilical panniculectomy with Dr. Marla Roe on 04/24/2022.  She is just shy of 4 weeks postop.  She reports that she is doing well. She noted that approx three days ago her drain came out. She was unable to give the details surrounding it. She placed dressing over the drain site. She notes that she has had no concerns with her incision, denying redness, discharge or pain.  She does note that she has been constipated over the last several days, she has had some nausea and had vomiting the other day but none since that time.  She notes she is passing gas.  Chaperone present for exam On Exam Lower Abdominal Incision Is Clean Dry and Intact, She Does Have Some Superficial Wounds along the Right Lateral Incision and just lateral to the midline, she has no surrounding redness, warmth or discharge.  Her abdomen is soft and nontender with no fluid collections.  Overall the patient appears to be doing well, unfortunately her drain was accidentally removed.  We will watch her closely for any fluid collections, at this time she has no signs of infectious etiology or obvious fluid collections.  I did instruct her to use Vaseline along the abdominal wounds.  She does describe some constipation, encouraged her to use MiraLAX for this.  She will follow-up in 1 week for reevaluation or sooner as needed.  She verbalized understanding and agreement to today's plan had no further questions or concerns.

## 2022-05-21 ENCOUNTER — Other Ambulatory Visit: Payer: Self-pay | Admitting: Internal Medicine

## 2022-05-21 ENCOUNTER — Other Ambulatory Visit: Payer: Self-pay | Admitting: Family

## 2022-05-22 ENCOUNTER — Ambulatory Visit: Payer: Medicare Other | Attending: Nephrology

## 2022-05-26 ENCOUNTER — Ambulatory Visit: Payer: Medicare Other | Admitting: Physician Assistant

## 2022-05-27 ENCOUNTER — Encounter: Payer: Self-pay | Admitting: Student

## 2022-05-27 ENCOUNTER — Ambulatory Visit (INDEPENDENT_AMBULATORY_CARE_PROVIDER_SITE_OTHER): Payer: Medicare Other | Admitting: Plastic Surgery

## 2022-05-27 DIAGNOSIS — M793 Panniculitis, unspecified: Secondary | ICD-10-CM

## 2022-05-27 NOTE — Progress Notes (Signed)
Pt seen by Dr. Marla Roe

## 2022-05-27 NOTE — Progress Notes (Signed)
The patient is a 56 year old female here for follow-up on her panniculectomy.  She is doing much better.  She has a little bit of swelling but the majority of it has resolved as well as the bruising.  Her incision is closed.  No sign of drainage, seroma or hematoma.  Would like to see her back in 1 month.

## 2022-05-28 ENCOUNTER — Telehealth: Payer: Self-pay

## 2022-05-28 ENCOUNTER — Other Ambulatory Visit: Payer: Self-pay

## 2022-05-28 ENCOUNTER — Encounter: Payer: Self-pay | Admitting: Internal Medicine

## 2022-05-28 ENCOUNTER — Ambulatory Visit (INDEPENDENT_AMBULATORY_CARE_PROVIDER_SITE_OTHER): Payer: Medicare Other | Admitting: Internal Medicine

## 2022-05-28 VITALS — BP 140/80 | HR 100 | Temp 98.6°F | Ht 63.0 in | Wt 229.8 lb

## 2022-05-28 DIAGNOSIS — I10 Essential (primary) hypertension: Secondary | ICD-10-CM

## 2022-05-28 DIAGNOSIS — E785 Hyperlipidemia, unspecified: Secondary | ICD-10-CM | POA: Diagnosis not present

## 2022-05-28 DIAGNOSIS — Z6841 Body Mass Index (BMI) 40.0 and over, adult: Secondary | ICD-10-CM

## 2022-05-28 DIAGNOSIS — G4733 Obstructive sleep apnea (adult) (pediatric): Secondary | ICD-10-CM

## 2022-05-28 DIAGNOSIS — Z9989 Dependence on other enabling machines and devices: Secondary | ICD-10-CM

## 2022-05-28 MED ORDER — WEGOVY 1 MG/0.5ML ~~LOC~~ SOAJ
1.0000 mg | SUBCUTANEOUS | 0 refills | Status: DC
  Filled 2022-05-28: qty 2, 28d supply, fill #0

## 2022-05-28 MED ORDER — WEGOVY 1.7 MG/0.75ML ~~LOC~~ SOAJ
1.7000 mg | SUBCUTANEOUS | 0 refills | Status: DC
  Filled 2022-05-28: qty 3, 28d supply, fill #0

## 2022-05-28 MED ORDER — WEGOVY 2.4 MG/0.75ML ~~LOC~~ SOAJ
2.4000 mg | SUBCUTANEOUS | 11 refills | Status: DC
  Filled 2022-05-28: qty 3, 28d supply, fill #0

## 2022-05-28 NOTE — Telephone Encounter (Signed)
Patient states the medication Dr. Olivia Mackie McLean-Scocuzza told her about is not covered by her insurance.  Patient states she would like to know some alternatives that may be covered by her insurance.  Patient states her preferred pharmacy is the [facility] and her second choice is [facility] on [address], in case the first pharmacy is unable to get the medication, maybe the second one will have it.  Patient states she would like for [place] to call her at [phone].

## 2022-05-28 NOTE — Progress Notes (Signed)
Chief Complaint  Patient presents with   [facility] f/u her tube came out and on Monday the put it back in    ED f/u 05/17/22 [facility]  S/p panniculectomy with drain tubes in since 04/24/22 and the tubing came out she saw plastics 05/20/22 and is doing well w/o active fluid collections no signs seroma/hematoma and seen again with plastics 05/27/22 and drain is no longer needed and pt is healing well   Obesity BMI >40 she wants to try something other than victoza 1.8 qd with h/o prediabetes but her insurance will not cover wegovy or saxenda. She is at high risk obesity due psychiatric meds also rec healthy diet and exercise    Review of Systems  Constitutional:  Negative for weight loss.  HENT:  Negative for hearing loss.   Eyes:  Negative for blurred vision.  Respiratory:  Negative for shortness of breath.   Cardiovascular:  Negative for chest pain.  Gastrointestinal:  Negative for abdominal pain and blood in stool.  Genitourinary:  Negative for dysuria.  Musculoskeletal:  Negative for falls and joint pain.  Skin:  Negative for rash.  Neurological:  Negative for headaches.  Psychiatric/Behavioral:  Negative for depression.    Past Medical History:  Diagnosis Date   Allergic rhinitis    Anxiety    Arthritis of knee, right    Asthma    Bipolar disorder ([place])    Chronic low back pain    CTS (carpal tunnel syndrome)    hands   Depression    Gallstones    GERD (gastroesophageal reflux disease)    History of prediabetes    Hypertension    Laceration of right foot    Onychomycosis    OSA on CPAP    does not use cpap   Overactive bladder    Pre-diabetes    diet controlled   PVC's (premature ventricular contractions)    PVC's (premature ventricular contractions)    Dr. Raliegh Ip cards   Seizures [facility])    8th grade   Past Surgical History:  Procedure Laterality Date   ABDOMINAL HYSTERECTOMY  07/08/2011   CARPAL TUNNEL RELEASE     right hand 1996   CESAREAN SECTION     2012    CHOLECYSTECTOMY N/A 01/12/2018   Procedure: LAPAROSCOPIC CHOLECYSTECTOMY WITH INTRAOPERATIVE CHOLANGIOGRAM ERAS PATHWAY;  Surgeon: Johnathan Hausen, MD;  Location: [facility] ORS;  Service: General;  Laterality: N/A;   COLONOSCOPY WITH PROPOFOL N/A 04/15/2019   Procedure: COLONOSCOPY WITH PROPOFOL;  Surgeon: Jonathon Bellows, MD;  Location: [facility] ENDOSCOPY;  Service: Gastroenterology;  Laterality: N/A;   DILATION AND CURETTAGE OF UTERUS     04/2011 benign endometrial polyp squamous metaplasia, inflamm, blood, mucous, benign    ESOPHAGOGASTRODUODENOSCOPY (EGD) WITH PROPOFOL N/A 10/23/2020   Procedure: ESOPHAGOGASTRODUODENOSCOPY (EGD) WITH PROPOFOL;  Surgeon: Jonathon Bellows, MD;  Location: [facility] ENDOSCOPY;  Service: Gastroenterology;  Laterality: N/A;   FLEXIBLE SIGMOIDOSCOPY N/A 02/15/2021   Procedure: FLEXIBLE SIGMOIDOSCOPY;  Surgeon: Jonathon Bellows, MD;  Location: [facility] ENDOSCOPY;  Service: Gastroenterology;  Laterality: N/A;   FOOT SURGERY     Dr. Prudence Davidson    LEEP     2006   PANNICULECTOMY N/A 04/24/2022   Procedure: INFRAUMBILICAL PANNICULECTOMY;  Surgeon: Wallace Going, DO;  Location: [place];  Service: Plastics;  Laterality: N/A;   TUBAL LIGATION     Family History  Problem Relation Age of Onset   Heart failure Mother    Hypertension Mother    Early death Mother  Heart disease Mother        chf   Cancer Father        esophageal    Hypertension Father    Breast cancer Sister 69   Cancer Sister        breast   Depression Sister    Mental illness Sister    Asthma Son    Cancer Other        brain cancer   Cancer Paternal Aunt        brain cancer   Social History   Socioeconomic History   Marital status: Married    Spouse name: Not on file   Number of children: Not on file   Years of education: Not on file   Highest education level: Not on file  Occupational History   Not on file  Tobacco Use   Smoking status: Never   Smokeless tobacco: Former    Types: Snuff   Tobacco comments:     Is in process of decreasing amount  Vaping Use   Vaping Use: Never used  Substance and Sexual Activity   Alcohol use: No    Alcohol/week: 0.0 standard drinks of alcohol   Drug use: No   Sexual activity: Not Currently    Birth control/protection: Surgical    Comment: Hysterectomy  Other Topics Concern   Not on file  Social History Narrative   Married    No guns    Wears seat belt    Safe in relationship    Never smoker    2 sons Gaspar Bidding ~10 and another son ~28 as of 10/2019   1 granson eli 1 y.o as of 10/2019   2 sisters    Unemployed       Social Determinants of Health   Financial Resource Strain: Low Risk  (09/13/2021)   Overall Financial Resource Strain (CARDIA)    Difficulty of Paying Living Expenses: Not hard at all  Food Insecurity: No Food Insecurity (02/25/2021)   Hunger Vital Sign    Worried About Running Out of Food in the Last Year: Never true    Ran Out of Food in the Last Year: Never true  Transportation Needs: No Transportation Needs (02/25/2021)   PRAPARE - Hydrologist (Medical): No    Lack of Transportation (Non-Medical): No  Physical Activity: Insufficiently Active (07/07/2019)   Exercise Vital Sign    Days of Exercise per Week: 3 days    Minutes of Exercise per Session: 30 min  Stress: No Stress Concern Present (10/01/2020)   [place]    Feeling of Stress : Not at all  Social Connections: Unknown (10/01/2020)   Social Connection and Isolation Panel [NHANES]    Frequency of Communication with Friends and Family: Not on file    Frequency of Social Gatherings with Friends and Family: Not on file    Attends Religious Services: Not on file    Active Member of Clubs or Organizations: Not on file    Attends Archivist Meetings: Not on file    Marital Status: Married  Intimate Partner Violence: Not At Risk (02/25/2021)   Humiliation, Afraid, Rape, and Kick  questionnaire    Fear of Current or Ex-Partner: No    Emotionally Abused: No    Physically Abused: No    Sexually Abused: No   Current Meds  Medication Sig   albuterol (VENTOLIN HFA) 108 (90 Base) MCG/ACT inhaler Inhale  1-2 puffs into the lungs every 6 (six) hours as needed for wheezing or shortness of breath.   amLODipine (NORVASC) 5 MG tablet Take 1 tablet (5 mg total) by mouth daily.   benztropine (COGENTIN) 1 MG tablet Take 1 mg by mouth 2 (two) times daily.   Blood Glucose Monitoring Suppl (ACCU-CHEK GUIDE ME) w/Device KIT Use as directed,once daily to test blood sugar   buPROPion (WELLBUTRIN XL) 150 MG 24 hr tablet Take 150 mg by mouth every morning.   celecoxib (CELEBREX) 200 MG capsule Take 1 capsule by mouth 2 (two) times daily as needed (pain).   cetirizine (ZYRTEC ALLERGY) 10 MG tablet Take 2 tablets (20 mg total) by mouth daily.   EPINEPHrine 0.3 mg/0.3 mL IJ SOAJ injection Inject 0.3 mg into the muscle as needed for anaphylaxis.   famotidine (PEPCID) 40 MG tablet Take 1 tablet (40 mg total) by mouth daily.   fexofenadine (ALLEGRA) 180 MG tablet Take 2 tablets (360 mg total) by mouth every morning. Prn only if needed either take zyrtec or allegra explain to patient   FLUoxetine (PROZAC) 40 MG capsule Take 40 mg by mouth daily.   fluPHENAZine (PROLIXIN) 2.5 MG tablet Take 2.5 mg by mouth 2 (two) times daily.   fluticasone (FLONASE) 50 MCG/ACT nasal spray Place 2 sprays into both nostrils daily.   furosemide (LASIX) 20 MG tablet Take 0.5-1 tablets (10-20 mg total) by mouth daily as needed. (Patient taking differently: Take 20 mg by mouth daily as needed.)   hydrocortisone 2.5 % ointment Apply topically 2 (two) times daily. Neck   hydrOXYzine (ATARAX) 50 MG tablet Take 50 mg by mouth 4 (four) times daily as needed for anxiety.   metoprolol succinate (TOPROL-XL) 25 MG 24 hr tablet Take 1 tablet (25 mg total) by mouth daily.   montelukast (SINGULAIR) 10 MG tablet Take 1 tablet (10 mg  total) by mouth daily.   Multiple Vitamins-Minerals (ALIVE MULTI-VITAMIN PO) Take by mouth daily in the afternoon.   nicotine (NICODERM CQ - DOSED IN MG/24 HOURS) 14 mg/24hr patch Place 1 patch (14 mg total) onto the skin daily. X 45 days then go down to 7 mg patch   nicotine (NICODERM CQ - DOSED IN MG/24 HR) 7 mg/24hr patch Place 1 patch (7 mg total) onto the skin daily. After using 14 mg patch   omalizumab (XOLAIR) 150 MG/ML prefilled syringe Inject 300 mg into the skin every 28 (twenty-eight) days.   omeprazole (PRILOSEC) 40 MG capsule Take 1 capsule (40 mg total) by mouth daily.   ondansetron (ZOFRAN-ODT) 4 MG disintegrating tablet Take 1 tablet (4 mg total) by mouth every 8 (eight) hours as needed for nausea or vomiting.   oxyCODONE-acetaminophen (PERCOCET) 10-325 MG tablet Take 1 tablet by mouth 3 (three) times daily as needed for pain.   QUEtiapine (SEROQUEL XR) 400 MG 24 hr tablet Take 400 mg by mouth at bedtime.   scopolamine (TRANSDERM-SCOP) 1 MG/3DAYS Place 1 patch (1.5 mg total) onto the skin every 3 (three) days. 4 hours before you get on boat   topiramate (TOPAMAX) 100 MG tablet Take 100 mg by mouth at bedtime.   valACYclovir (VALTREX) 1000 MG tablet Take 1 tablet (1,000 mg total) by mouth 2 (two) times daily. for 7 days prn   vitamin B-12 (CYANOCOBALAMIN) 1000 MCG tablet Take 1,000 mcg by mouth daily.   [DISCONTINUED] OZEMPIC, 1 MG/DOSE, 4 MG/3ML SOPN Inject 1 mg as directed once a week. Same day. D/c victoza   [DISCONTINUED]  Semaglutide-Weight Management (WEGOVY) 1 MG/0.5ML SOAJ Inject 1 mg into the skin once a week.   [DISCONTINUED] Semaglutide-Weight Management (WEGOVY) 1.7 MG/0.75ML SOAJ Inject 1.7 mg into the skin once a week. Switch from Victoza 1.8 qd if dose available x 1 month. If dose not available fill 1 mg weekly until 1.7 available   [DISCONTINUED] Semaglutide-Weight Management (WEGOVY) 2.4 MG/0.75ML SOAJ Inject 2.4 mg into the skin once a week.   [DISCONTINUED] VICTOZA  18 MG/3ML SOPN Inject 1.8 mg into the skin daily.   Allergies  Allergen Reactions   Cozaar [Losartan] Hives and Swelling    Angioedema   Keflex [Cephalexin] Swelling and Rash   Penicillins Rash    Has patient had a PCN reaction causing immediate rash, facial/tongue/throat swelling, SOB or lightheadedness with hypotension: Yes Has patient had a PCN reaction causing severe rash involving mucus membranes or skin necrosis: No Has patient had a PCN reaction that required hospitalization: No Has patient had a PCN reaction occurring within the last 10 years: No If all of the above answers are "NO", then may proceed with Cephalosporin use.    Zestril [Lisinopril] Hives and Other (See Comments)    Headache Low BP Angioedema     Hctz [Hydrochlorothiazide] Swelling and Rash   Bee Venom Swelling   Metformin And Related Rash   Recent Results (from the past 2160 hour(s))  Basic metabolic panel per protocol     Status: None   Collection Time: 04/24/22 12:20 PM  Result Value Ref Range   Sodium 141 135 - 145 mmol/L   Potassium 4.0 3.5 - 5.1 mmol/L   Chloride 109 98 - 111 mmol/L   CO2 24 22 - 32 mmol/L   Glucose, Bld 95 70 - 99 mg/dL    Comment: Glucose reference range applies only to samples taken after fasting for at least 8 hours.   BUN 8 6 - 20 mg/dL   Creatinine, Ser 0.96 0.44 - 1.00 mg/dL   Calcium 9.2 8.9 - 10.3 mg/dL   GFR, Estimated >60 >60 mL/min    Comment: (NOTE) Calculated using the CKD-EPI Creatinine Equation (2021)    Anion gap 8 5 - 15    Comment: Performed at [facility] [address], [place], [place] [address]  CBC per protocol     Status: Abnormal   Collection Time: 04/24/22 12:20 PM  Result Value Ref Range   WBC 5.2 4.0 - 10.5 K/uL   RBC 3.82 (L) 3.87 - 5.11 MIL/uL   Hemoglobin 11.4 (L) 12.0 - 15.0 g/dL   HCT 31.5 (L) 36.0 - 46.0 %   MCV 82.5 80.0 - 100.0 fL   MCH 29.8 26.0 - 34.0 pg   MCHC 36.2 (H) 30.0 - 36.0 g/dL   RDW 14.7 11.5 - 15.5 %   Platelets  204 150 - 400 K/uL   nRBC 0.0 0.0 - 0.2 %    Comment: Performed at [facility], [place] [address] [place], [place] [address]  Surgical pathology     Status: None   Collection Time: 04/24/22  3:01 PM  Result Value Ref Range   SURGICAL PATHOLOGY      SURGICAL PATHOLOGY CASE: [id number] PATIENT: Lorianne Bonelli Surgical Pathology Report     Clinical History: panniculitis (cm)     FINAL MICROSCOPIC DIAGNOSIS:  A. PANNICULUS, PANNICULECTOMY: -  Unremarkable skin and fragments of adipose tissue with no significant pathology.   GROSS DESCRIPTION:  Received fresh is a 1533 g, 55 x 12  cm elliptical tan-brown unremarkable skin with underlying soft lobulated fatty tissue up to 6 cm in depth. Also included with the specimen are several separate soft lobulated fatty tissues, 7.6 x 5.6 x 2.2 cm in aggregate.  Sections through the skin with underlying fatty tissue and separate fatty tissues are unremarkable.  Representative sections are submitted 1 block.  SW 04/25/2022   Final Diagnosis performed by Tilford Pillar DO.   Electronically signed 04/28/2022 Technical component performed at [employer], [place] [address], [place], [place] [address].  Professional component performed at [facility], [place] [address], [place], [place] [address].  Immunohistochemistry Technical component (if applicable) was performed at [facility]. [address], [place], [place], [place] [address].   IMMUNOHISTOCHEMISTRY DISCLAIMER (if applicable): Some of these immunohistochemical stains may have been developed and the performance characteristics determine by [facility]. Some may not have been cleared or approved by the U.S. Food and Drug Administration. The FDA has determined that such clearance or approval is not necessary. This test is used for clinical purposes. It should not be regarded as investigational or for research.  This laboratory is certified under the [place] (CLIA-88) as qualified to perform high complexity clinical laboratory testing.  The controls stained appropriately.   CBC with Differential     Status: Abnormal   Collection Time: 05/17/22 12:47 PM  Result Value Ref Range   WBC 7.9 4.0 - 10.5 K/uL   RBC 3.27 (L) 3.87 - 5.11 MIL/uL   Hemoglobin 9.3 (L) 12.0 - 15.0 g/dL   HCT 26.6 (L) 36.0 - 46.0 %   MCV 81.3 80.0 - 100.0 fL   MCH 28.4 26.0 - 34.0 pg   MCHC 35.0 30.0 - 36.0 g/dL   RDW 13.4 11.5 - 15.5 %   Platelets 288 150 - 400 K/uL   nRBC 0.0 0.0 - 0.2 %   Neutrophils Relative % 67 %   Neutro Abs 5.3 1.7 - 7.7 K/uL   Lymphocytes Relative 21 %   Lymphs Abs 1.6 0.7 - 4.0 K/uL   Monocytes Relative 8 %   Monocytes Absolute 0.6 0.1 - 1.0 K/uL   Eosinophils Relative 3 %   Eosinophils Absolute 0.2 0.0 - 0.5 K/uL   Basophils Relative 1 %   Basophils Absolute 0.1 0.0 - 0.1 K/uL   Immature Granulocytes 0 %   Abs Immature Granulocytes 0.02 0.00 - 0.07 K/uL    Comment: Performed at [facility], [address] [place], [place] [address]  Comprehensive metabolic panel     Status: Abnormal   Collection Time: 05/17/22 12:47 PM  Result Value Ref Range   Sodium 139 135 - 145 mmol/L   Potassium 3.5 3.5 - 5.1 mmol/L   Chloride 108 98 - 111 mmol/L   CO2 25 22 - 32 mmol/L   Glucose, Bld 89 70 - 99 mg/dL    Comment: Glucose reference range applies only to samples taken after fasting for at least 8 hours.   BUN 11 6 - 20 mg/dL   Creatinine, Ser 1.06 (H) 0.44 - 1.00 mg/dL   Calcium 8.9 8.9 - 10.3 mg/dL   Total Protein 7.3 6.5 - 8.1 g/dL   Albumin 3.4 (L) 3.5 - 5.0 g/dL   AST 16 15 - 41 U/L   ALT 13 0 - 44 U/L   Alkaline Phosphatase 65 38 - 126 U/L   Total Bilirubin 0.4 0.3 - 1.2 mg/dL  GFR, Estimated >60 >60 mL/min    Comment: (NOTE) Calculated using the CKD-EPI Creatinine Equation (2021)    Anion gap 6 5 - 15    Comment: Performed at  [facility], [address] [place], [place] [address]   Objective  Body mass index is 40.71 kg/m. Wt Readings from Last 3 Encounters:  05/28/22 229 lb 12.8 oz (104.2 kg)  05/17/22 220 lb (99.8 kg)  05/06/22 225 lb 12.8 oz (102.4 kg)   Temp Readings from Last 3 Encounters:  05/28/22 98.6 F (37 C) (Oral)  05/17/22 99.6 F (37.6 C) (Oral)  05/06/22 97.8 F (36.6 C) (Oral)   BP Readings from Last 3 Encounters:  05/28/22 (!) 140/80  05/17/22 (!) 155/88  05/06/22 122/74   Pulse Readings from Last 3 Encounters:  05/28/22 100  05/17/22 (!) 102  05/06/22 (!) 104    Physical Exam Vitals and nursing note reviewed.  Constitutional:      Appearance: Normal appearance. She is well-developed and well-groomed.  HENT:     Head: Normocephalic and atraumatic.  Eyes:     Conjunctiva/sclera: Conjunctivae normal.     Pupils: Pupils are equal, round, and reactive to light.  Cardiovascular:     Rate and Rhythm: Normal rate and regular rhythm.     Heart sounds: Normal heart sounds. No murmur heard. Pulmonary:     Effort: Pulmonary effort is normal.     Breath sounds: Normal breath sounds.  Abdominal:     General: Abdomen is flat. Bowel sounds are normal.     Tenderness: There is no abdominal tenderness.  Musculoskeletal:        General: No tenderness.  Skin:    General: Skin is warm and dry.     Comments: Panniculectomy scar   Neurological:     General: No focal deficit present.     Mental Status: She is alert and oriented to person, place, and time. Mental status is at baseline.     Cranial Nerves: Cranial nerves 2-12 are intact.     Motor: Motor function is intact.     Coordination: Coordination is intact.     Gait: Gait is intact.  Psychiatric:        Attention and Perception: Attention and perception normal.        Mood and Affect: Mood and affect normal.        Speech: Speech normal.        Behavior: Behavior normal. Behavior is cooperative.        Thought  Content: Thought content normal.        Cognition and Memory: Cognition and memory normal.        Judgment: Judgment normal.     Assessment  Plan  BMI 40.0-44.9, adult ([place]) with htn/h/o prediabetes,osa on cpap, hld- Plan:  Insurance will not cover saxenda/wegovy she will continue victoza 1.8 mg qd   S/p panniculectomy doing well   HM Flu shot utd Tdap rx per pt had 11/24/21 Pfizer 2/2 consider booster rec  Prevnar 13 09/2021  Pna 23 07/12/16 Consider shingrix future rx 11/24/21  Not immune MMR, hep B titer consider in the future  915/21 house calls visit A1c 6.1    h/o supracervical hysterectomy ([facility]), pap 02/29/16 negative no comment HPV, another pap 06/23/17 neg pap neg HPV -h/o LEEP 2006 11/29/19 negative pap    mammo 08/04/19 negative ordered 08/22/20 negative ordered 2022  08/05/21 negative ordered 07/2022    Colonoscopy 04/15/19 normal f/u in 10  years egd sch 10/23/20 Dr. Vicente Males neg bxs 10/23/20 aperistalsis and food + consider gastric emptying study and esophageal mamometry  10/25/20 Dr. Bailey Mech  Dr Jacklynn Lewis,    Echo Dr. Nehemiah Massed -nl LV function EF 60% moderate mitral insuff and mild tricuspid insufficiency treadmill stress test neg 07/30/13  -h/o PVCS   Hep C neg 08/17/20 HIV neg 02/04/16 Rec healthy diet and exercise    Ob/gyn appt pending bladder leakage I.e bladder sling vs other [facility] in PT (stopped PT w/o help worsen sneezing or coughing)  -Dr. Georgianne Fick pt wants surgery as of 04/20/20 sx's better and no surgery indicated    Consider referral repeat sleep study h/o OSA f/u Dr. Kathyrn Sheriff and will get procedure for OSA as of 04/17/21     Provider: Dr. Olivia Mackie McLean-Scocuzza-Internal Medicine

## 2022-05-28 NOTE — Patient Instructions (Addendum)
Start wegovy 1.7 weekly if available if not go down 1 mg weekly x 1 month then 1.7 weekly x 1 month then increase to 2.4 weekly x 1 month  Semaglutide Injection (Weight Management) What is this medication? SEMAGLUTIDE (SEM a GLOO tide) promotes weight loss. It may also be used to maintain weight loss. It works by decreasing appetite. Changes to diet and exercise are often combined with this medication. This medicine may be used for other purposes; ask your health care provider or pharmacist if you have questions. COMMON BRAND NAME(S): GMWNUU What should I tell my care team before I take this medication? They need to know if you have any of these conditions: Endocrine tumors (MEN 2) or if someone in your family had these tumors Eye disease, vision problems Gallbladder disease History of depression or mental health disease History of pancreatitis Kidney disease Stomach or intestine problems Suicidal thoughts, plans, or attempt; a previous suicide attempt by you or a family member Thyroid cancer or if someone in your family had thyroid cancer An unusual or allergic reaction to semaglutide, other medications, foods, dyes, or preservatives Pregnant or trying to get pregnant Breast-feeding How should I use this medication? This medication is injected under the skin. You will be taught how to prepare and give it. Take it as directed on the prescription label. It is given once every week (every 7 days). Keep taking it unless your care team tells you to stop. It is important that you put your used needles and pens in a special sharps container. Do not put them in a trash can. If you do not have a sharps container, call your pharmacist or care team to get one. A special MedGuide will be given to you by the pharmacist with each prescription and refill. Be sure to read this information carefully each time. This medication comes with INSTRUCTIONS FOR USE. Ask your pharmacist for directions on how to use  this medication. Read the information carefully. Talk to your pharmacist or care team if you have questions. Talk to your care team about the use of this medication in children. While it may be prescribed for children as young as 12 years for selected conditions, precautions do apply. Overdosage: If you think you have taken too much of this medicine contact a poison control center or emergency room at once. NOTE: This medicine is only for you. Do not share this medicine with others. What if I miss a dose? If you miss a dose and the next scheduled dose is more than 2 days away, take the missed dose as soon as possible. If you miss a dose and the next scheduled dose is less than 2 days away, do not take the missed dose. Take the next dose at your regular time. Do not take double or extra doses. If you miss your dose for 2 weeks or more, take the next dose at your regular time or call your care team to talk about how to restart this medication. What may interact with this medication? Insulin and other medications for diabetes This list may not describe all possible interactions. Give your health care provider a list of all the medicines, herbs, non-prescription drugs, or dietary supplements you use. Also tell them if you smoke, drink alcohol, or use illegal drugs. Some items may interact with your medicine. What should I watch for while using this medication? Visit your care team for regular checks on your progress. It may be some time before you see  the benefit from this medication. Drink plenty of fluids while taking this medication. Check with your care team if you have severe diarrhea, nausea, and vomiting, or if you sweat a lot. The loss of too much body fluid may make it dangerous for you to take this medication. This medication may affect blood sugar levels. Ask your care team if changes in diet or medications are needed if you have diabetes. If you or your family notice any changes in your  behavior, such as new or worsening depression, thoughts of harming yourself, anxiety, other unusual or disturbing thoughts, or memory loss, call your care team right away. Women should inform their care team if they wish to become pregnant or think they might be pregnant. Losing weight while pregnant is not advised and may cause harm to the unborn child. Talk to your care team for more information. What side effects may I notice from receiving this medication? Side effects that you should report to your care team as soon as possible: Allergic reactions--skin rash, itching, hives, swelling of the face, lips, tongue, or throat Change in vision Dehydration--increased thirst, dry mouth, feeling faint or lightheaded, headache, dark yellow or brown urine Gallbladder problems--severe stomach pain, nausea, vomiting, fever Heart palpitations--rapid, pounding, or irregular heartbeat Kidney injury--decrease in the amount of urine, swelling of the ankles, hands, or feet Pancreatitis--severe stomach pain that spreads to your back or gets worse after eating or when touched, fever, nausea, vomiting Thoughts of suicide or self-harm, worsening mood, feelings of depression Thyroid cancer--new mass or lump in the neck, pain or trouble swallowing, trouble breathing, hoarseness Side effects that usually do not require medical attention (report to your care team if they continue or are bothersome): Diarrhea Loss of appetite Nausea Stomach pain Vomiting This list may not describe all possible side effects. Call your doctor for medical advice about side effects. You may report side effects to FDA at 1-800-FDA-1088. Where should I keep my medication? Keep out of the reach of children and pets. Refrigeration (preferred): Store in the refrigerator. Do not freeze. Keep this medication in the original container until you are ready to take it. Get rid of any unused medication after the expiration date. Room temperature: If  needed, prior to cap removal, the pen can be stored at room temperature for up to 28 days. Protect from light. If it is stored at room temperature, get rid of any unused medication after 28 days or after it expires, whichever is first. It is important to get rid of the medication as soon as you no longer need it or it is expired. You can do this in two ways: Take the medication to a medication take-back program. Check with your pharmacy or law enforcement to find a location. If you cannot return the medication, follow the directions in the [place]. NOTE: This sheet is a summary. It may not cover all possible information. If you have questions about this medicine, talk to your doctor, pharmacist, or health care provider.  2023 Elsevier/Gold Standard (2020-12-06 00:00:00)

## 2022-05-29 ENCOUNTER — Telehealth: Payer: Self-pay

## 2022-05-29 NOTE — Telephone Encounter (Signed)
LMOM for pt to CB on the given number

## 2022-05-29 NOTE — Telephone Encounter (Signed)
Called pt in regards to her 1st call :   Heather Sweeney 23 hours ago (5:08 PM)    Patient states the medication Dr. Olivia Mackie McLean-Scocuzza told her about is not covered by her insurance.  Patient states she would like to know some alternatives that may be covered by her insurance.  Patient states her preferred pharmacy is the [facility] and her second choice is [facility] on [address], in case the first pharmacy is unable to get the medication, maybe the second one will have it.   Patient states she would like for [place] to call her at [phone]      Pt stated wegovy isn;t covered by her insurance but she called her insurance and they said [name] is covered.  I will be fwd this info to Dr. Olivia Mackie.

## 2022-05-29 NOTE — Telephone Encounter (Signed)
  LMOM to CB on the given number provided [name] 17 hours ago (5:08 PM)    Patient states the medication Dr. Olivia Mackie McLean-Scocuzza told her about is not covered by her insurance.  Patient states she would like to know some alternatives that may be covered by her insurance.  Patient states her preferred pharmacy is the [facility] and her second choice is [facility] on [address], in case the first pharmacy is unable to get the medication, maybe the second one will have it.   Patient states she would like for [place] to call her at [phone].      Note    Heather, Sweeney [phone]  Walker Shadow P

## 2022-05-30 DIAGNOSIS — M1711 Unilateral primary osteoarthritis, right knee: Secondary | ICD-10-CM | POA: Diagnosis not present

## 2022-06-02 ENCOUNTER — Telehealth: Payer: Self-pay

## 2022-06-02 NOTE — Telephone Encounter (Signed)
Called pts insurance to see if Kirke Shaggy would be covered.  Ref #: [id number]  Spoke with Varney Biles at [facility] to see if Kirke Shaggy was covered and Varney Biles told me that Kirke Shaggy was excluded from coverage.  Called and notified pt as well

## 2022-06-02 NOTE — Telephone Encounter (Signed)
Spoke with pt (8/28) in regards to Dr. Audrie Gallus advice. Pt verbalized understanding and said she will reach out to pharmacists and get back to [place].  McLean-Scocuzza, Nino Glow, MD  You 3 hours ago (5:32 AM)   TM She is not diabetic so [name] is only for diabetics Can she check with her pharmacy if saxenda is covered?

## 2022-06-02 NOTE — Telephone Encounter (Signed)
She is not diabetic so [name] is only for diabetics Can she check with her pharmacy if saxenda is covered?

## 2022-06-04 NOTE — Telephone Encounter (Signed)
Pt called back and I read the message to her and she wants to know if anything is covered

## 2022-06-07 ENCOUNTER — Other Ambulatory Visit: Payer: Self-pay | Admitting: Internal Medicine

## 2022-06-09 NOTE — Telephone Encounter (Signed)
Saxenda or wegovy are truly only alternatives there is nothing else recommended  Her insurance has covered victoza for years  Does she want to stay on this? What does is she currently taking?

## 2022-06-10 ENCOUNTER — Other Ambulatory Visit: Payer: Self-pay | Admitting: Internal Medicine

## 2022-06-10 DIAGNOSIS — I10 Essential (primary) hypertension: Secondary | ICD-10-CM

## 2022-06-10 DIAGNOSIS — R7303 Prediabetes: Secondary | ICD-10-CM

## 2022-06-10 DIAGNOSIS — Z87898 Personal history of other specified conditions: Secondary | ICD-10-CM

## 2022-06-10 MED ORDER — INSULIN PEN NEEDLE 30G X 8 MM MISC: 3 refills | Status: DC

## 2022-06-10 MED ORDER — VICTOZA 18 MG/3ML ~~LOC~~ SOPN: 1.8000 mg | PEN_INJECTOR | Freq: Every day | SUBCUTANEOUS | 11 refills | Status: DC

## 2022-06-10 NOTE — Telephone Encounter (Signed)
Happy belated Bday victoza sent with refills

## 2022-06-13 DIAGNOSIS — M1711 Unilateral primary osteoarthritis, right knee: Secondary | ICD-10-CM | POA: Diagnosis not present

## 2022-06-16 ENCOUNTER — Telehealth: Payer: Self-pay

## 2022-06-16 NOTE — Telephone Encounter (Signed)
Today 06/16/22 I faxed over completed New Therapy recommendation request form to [facility] care @ fax # [phone] with a confirm trans log.

## 2022-06-17 ENCOUNTER — Ambulatory Visit: Payer: Medicare Other | Admitting: Internal Medicine

## 2022-06-17 DIAGNOSIS — Z6841 Body Mass Index (BMI) 40.0 and over, adult: Secondary | ICD-10-CM | POA: Insufficient documentation

## 2022-06-17 HISTORY — DX: Body Mass Index (BMI) 40.0 and over, adult: Z684

## 2022-06-19 ENCOUNTER — Ambulatory Visit (INDEPENDENT_AMBULATORY_CARE_PROVIDER_SITE_OTHER): Payer: Medicare Other

## 2022-06-19 VITALS — BP 128/82 | HR 97 | Temp 98.3°F | Resp 15 | Ht 63.0 in | Wt 222.4 lb

## 2022-06-19 DIAGNOSIS — Z0000 Encounter for general adult medical examination without abnormal findings: Secondary | ICD-10-CM | POA: Diagnosis not present

## 2022-06-19 NOTE — Patient Instructions (Addendum)
Heather Sweeney , Thank you for taking time to come for your Medicare Wellness Visit. I appreciate your ongoing commitment to your health goals. Please review the following plan we discussed and let me know if I can assist you in the future.   These are the goals we discussed:  Goals       Patient Stated     Weight (lb) < 200 lb (90.7 kg) (pt-stated)      Weight goal 180lb      Other     (RNCM) Eat Healthy      Timeframe:  Long-Range Goal Priority:  Medium Start Date:   02/25/21                          Expected End Date: 12/03/21                  Follow Up Date 07/29/21    Fill half of plate with vegetables Keep a food diary Set a realistic goal  Try to drink/incorporate Glucerna/Boost Shakes or fruit/vegetable smoothies into your daily diet routine Eat at least 3 meals day   Why is this important?   When you are ready to manage your nutrition or weight, having a plan and setting goals will help.  Taking small steps to change how you eat and exercise is a good place to start.    Notes:       (RNCM) Track and Manage My Blood Pressure-Hypertension      Timeframe:  Long-Range Goal Priority:  Medium Start Date:  02/25/21                           Expected End Date: 12/03/21                     Follow Up Date 07/29/21    Check blood pressure 3 times per week Ask husband to help you check your blood pressure Write blood pressure results in [place] Take log to providers office for review Review education sent to MyChart    Why is this important?   You won't feel high blood pressure, but it can still hurt your blood vessels.  High blood pressure can cause heart or kidney problems. It can also cause a stroke.  Making lifestyle changes like losing a little weight or eating less salt will help.  Checking your blood pressure at home and at different times of the day can help to control blood pressure.  If the doctor prescribes medicine remember to take it the way the  doctor ordered.  Call the office if you cannot afford the medicine or if there are questions about it.     Notes:         This is a list of the screening recommended for you and due dates:  Health Maintenance  Topic Date Due   COVID-19 Vaccine (3 - Pfizer risk series) 07/05/2022*   Flu Shot  01/04/2023*   Pap Smear  11/28/2022   Yearly kidney health urinalysis for diabetes  05/15/2023   Yearly kidney function blood test for diabetes  05/18/2023   Mammogram  08/06/2023   Colon Cancer Screening  04/14/2029   Tetanus Vaccine  11/25/2031   Hepatitis C Screening: USPSTF Recommendation to screen - Ages 18-79 yo.  Completed   HIV Screening  Completed   Zoster (Shingles) Vaccine  Completed   HPV Vaccine  Aged Out  *Topic was postponed. The date shown is not the original due date.    Next appointment: Follow up in one year for your annual wellness visit.   Preventive Care 40-64 Years, Female Preventive care refers to lifestyle choices and visits with your health care provider that can promote health and wellness. What does preventive care include? A yearly physical exam. This is also called an annual well check. Dental exams once or twice a year. Routine eye exams. Ask your health care provider how often you should have your eyes checked. Personal lifestyle choices, including: Daily care of your teeth and gums. Regular physical activity. Eating a healthy diet. Avoiding tobacco and drug use. Limiting alcohol use. Practicing safe sex. Taking low-dose aspirin daily starting at age 27. Taking vitamin and mineral supplements as recommended by your health care provider. What happens during an annual well check? The services and screenings done by your health care provider during your annual well check will depend on your age, overall health, lifestyle risk factors, and family history of disease. Counseling  Your health care provider may ask you questions about your: Alcohol  use. Tobacco use. Drug use. Emotional well-being. Home and relationship well-being. Sexual activity. Eating habits. Work and work Statistician. Method of birth control. Menstrual cycle. Pregnancy history. Screening  You may have the following tests or measurements: Height, weight, and BMI. Blood pressure. Lipid and cholesterol levels. These may be checked every 5 years, or more frequently if you are over 53 years old. Skin check. Lung cancer screening. You may have this screening every year starting at age 6 if you have a 30-pack-year history of smoking and currently smoke or have quit within the past 15 years. Fecal occult blood test (FOBT) of the stool. You may have this test every year starting at age 59. Flexible sigmoidoscopy or colonoscopy. You may have a sigmoidoscopy every 5 years or a colonoscopy every 10 years starting at age 99. Hepatitis C blood test. Hepatitis B blood test. Sexually transmitted disease (STD) testing. Diabetes screening. This is done by checking your blood sugar (glucose) after you have not eaten for a while (fasting). You may have this done every 1-3 years. Mammogram. This may be done every 1-2 years. Talk to your health care provider about when you should start having regular mammograms. This may depend on whether you have a family history of breast cancer. BRCA-related cancer screening. This may be done if you have a family history of breast, ovarian, tubal, or peritoneal cancers. Pelvic exam and Pap test. This may be done every 3 years starting at age 45. Starting at age 76, this may be done every 5 years if you have a Pap test in combination with an HPV test. Bone density scan. This is done to screen for osteoporosis. You may have this scan if you are at high risk for osteoporosis. Discuss your test results, treatment options, and if necessary, the need for more tests with your health care provider. Vaccines  Your health care provider may recommend  certain vaccines, such as: Influenza vaccine. This is recommended every year. Tetanus, diphtheria, and acellular pertussis (Tdap, Td) vaccine. You may need a Td booster every 10 years. Zoster vaccine. You may need this after age 39. Pneumococcal 13-valent conjugate (PCV13) vaccine. You may need this if you have certain conditions and were not previously vaccinated. Pneumococcal polysaccharide (PPSV23) vaccine. You may need one or two doses if you smoke cigarettes or if you have certain conditions. Talk  to your health care provider about which screenings and vaccines you need and how often you need them. This information is not intended to replace advice given to you by your health care provider. Make sure you discuss any questions you have with your health care provider. Document Released: 10/19/2015 Document Revised: 06/11/2016 Document Reviewed: 07/24/2015 Elsevier Interactive Patient Education  2017 [place] Prevention in the Home Falls can cause injuries. They can happen to people of all ages. There are many things you can do to make your home safe and to help prevent falls. What can I do on the outside of my home? Regularly fix the edges of walkways and driveways and fix any cracks. Remove anything that might make you trip as you walk through a door, such as a raised step or threshold. Trim any bushes or trees on the path to your home. Use bright outdoor lighting. Clear any walking paths of anything that might make someone trip, such as rocks or tools. Regularly check to see if handrails are loose or broken. Make sure that both sides of any steps have handrails. Any raised decks and porches should have guardrails on the edges. Have any leaves, snow, or ice cleared regularly. Use sand or salt on walking paths during winter. Clean up any spills in your garage right away. This includes oil or grease spills. What can I do in the bathroom? Use night lights. Install grab bars  by the toilet and in the tub and shower. Do not use towel bars as grab bars. Use non-skid mats or decals in the tub or shower. If you need to sit down in the shower, use a plastic, non-slip stool. Keep the floor dry. Clean up any water that spills on the floor as soon as it happens. Remove soap buildup in the tub or shower regularly. Attach bath mats securely with double-sided non-slip rug tape. Do not have throw rugs and other things on the floor that can make you trip. What can I do in the bedroom? Use night lights. Make sure that you have a light by your bed that is easy to reach. Do not use any sheets or blankets that are too big for your bed. They should not hang down onto the floor. Have a firm chair that has side arms. You can use this for support while you get dressed. Do not have throw rugs and other things on the floor that can make you trip. What can I do in the kitchen? Clean up any spills right away. Avoid walking on wet floors. Keep items that you use a lot in easy-to-reach places. If you need to reach something above you, use a strong step stool that has a grab bar. Keep electrical cords out of the way. Do not use floor polish or wax that makes floors slippery. If you must use wax, use non-skid floor wax. Do not have throw rugs and other things on the floor that can make you trip. What can I do with my stairs? Do not leave any items on the stairs. Make sure that there are handrails on both sides of the stairs and use them. Fix handrails that are broken or loose. Make sure that handrails are as long as the stairways. Check any carpeting to make sure that it is firmly attached to the stairs. Fix any carpet that is loose or worn. Avoid having throw rugs at the top or bottom of the stairs. If you do have throw  rugs, attach them to the floor with carpet tape. Make sure that you have a light switch at the top of the stairs and the bottom of the stairs. If you do not have them, ask  someone to add them for you. What else can I do to help prevent falls? Wear shoes that: Do not have high heels. Have rubber bottoms. Are comfortable and fit you well. Are closed at the toe. Do not wear sandals. If you use a stepladder: Make sure that it is fully opened. Do not climb a closed stepladder. Make sure that both sides of the stepladder are locked into place. Ask someone to hold it for you, if possible. Clearly mark and make sure that you can see: Any grab bars or handrails. First and last steps. Where the edge of each step is. Use tools that help you move around (mobility aids) if they are needed. These include: Canes. Walkers. Scooters. Crutches. Turn on the lights when you go into a dark area. Replace any light bulbs as soon as they burn out. Set up your furniture so you have a clear path. Avoid moving your furniture around. If any of your floors are uneven, fix them. If there are any pets around you, be aware of where they are. Review your medicines with your doctor. Some medicines can make you feel dizzy. This can increase your chance of falling. Ask your doctor what other things that you can do to help prevent falls. This information is not intended to replace advice given to you by your health care provider. Make sure you discuss any questions you have with your health care provider. Document Released: 07/19/2009 Document Revised: 02/28/2016 Document Reviewed: 10/27/2014 Elsevier Interactive Patient Education  2017 [employer].

## 2022-06-19 NOTE — Progress Notes (Signed)
Subjective:   Heather Sweeney is a 56 y.o. female who presents for Medicare Annual (Subsequent) preventive examination.  Review of Systems    No ROS.  Medicare Wellness  Cardiac Risk Factors include: hypertension (Hx of prediabetes)     Objective:    Today's Vitals   06/19/22 0921  BP: 128/82  Pulse: 97  Resp: 15  Temp: 98.3 F (36.8 C)  SpO2: 99%  Weight: 222 lb 6.4 oz (100.9 kg)  Height: _0  (1.6 m)   Body mass index is 39.4 kg/m.     06/19/2022    9:33 AM 05/17/2022   12:45 PM 02/25/2021    9:24 AM 02/15/2021   10:20 AM 01/04/2021    4:31 PM 10/23/2020    9:08 AM 10/01/2020   10:53 AM  Advanced Directives  Does Patient Have a Medical Advance Directive? _1  No No  Would patient like information on creating a medical advance directive? No - Patient declined No - Patient declined Yes (MAU/Ambulatory/Procedural Areas - Information given) No - Patient declined  No - Patient declined No - Patient declined    Current Medications (verified) Outpatient Encounter Medications as of 06/19/2022  Medication Sig   ACCU-CHEK GUIDE test strip Use as instructed   albuterol (VENTOLIN HFA) 108 (90 Base) MCG/ACT inhaler Inhale 1-2 puffs into the lungs every 6 (six) hours as needed for wheezing or shortness of breath.   amLODipine (NORVASC) 5 MG tablet Take 1 tablet (5 mg total) by mouth daily.   benztropine (COGENTIN) 1 MG tablet Take 1 mg by mouth 2 (two) times daily.   Blood Glucose Monitoring Suppl (ACCU-CHEK GUIDE ME) w/Device KIT Use as directed,once daily to test blood sugar   buPROPion (WELLBUTRIN XL) 150 MG 24 hr tablet Take 150 mg by mouth every morning.   celecoxib (CELEBREX) 200 MG capsule Take 1 capsule by mouth 2 (two) times daily as needed (pain).   cetirizine (ZYRTEC ALLERGY) 10 MG tablet Take 2 tablets (20 mg total) by mouth daily.   EASY COMFORT PEN NEEDLES 31G X 8 MM MISC INJECT VICTOZA ONCE DAILY AS DIRECTED   EPINEPHrine 0.3 mg/0.3 mL IJ SOAJ injection  Inject 0.3 mg into the muscle as needed for anaphylaxis.   famotidine (PEPCID) 40 MG tablet Take 1 tablet (40 mg total) by mouth daily.   fexofenadine (ALLEGRA) 180 MG tablet Take 2 tablets (360 mg total) by mouth every morning. Prn only if needed either take zyrtec or allegra explain to patient   FLUoxetine (PROZAC) 40 MG capsule Take 40 mg by mouth daily.   fluPHENAZine (PROLIXIN) 2.5 MG tablet Take 2.5 mg by mouth 2 (two) times daily.   fluticasone (FLONASE) 50 MCG/ACT nasal spray Place 2 sprays into both nostrils daily.   furosemide (LASIX) 20 MG tablet Take 0.5-1 tablets (10-20 mg total) by mouth daily as needed. (Patient taking differently: Take 20 mg by mouth daily as needed.)   hydrocortisone 2.5 % ointment Apply topically 2 (two) times daily. Neck   hydrOXYzine (ATARAX) 50 MG tablet Take 50 mg by mouth 4 (four) times daily as needed for anxiety.   Insulin Pen Needle (NOVOFINE) 30G X 8 MM MISC Use with victoza daily 1.8 mg   liraglutide (VICTOZA) 18 MG/3ML SOPN Inject 1.8 mg into the skin daily.   metoprolol succinate (TOPROL-XL) 25 MG 24 hr tablet Take 1 tablet (25 mg total) by mouth daily.   montelukast (SINGULAIR) 10 MG tablet Take 1 tablet (10 mg  total) by mouth daily.   Multiple Vitamins-Minerals (ALIVE MULTI-VITAMIN PO) Take by mouth daily in the afternoon.   nicotine (NICODERM CQ - DOSED IN MG/24 HOURS) 14 mg/24hr patch Place 1 patch (14 mg total) onto the skin daily. X 45 days then go down to 7 mg patch   nicotine (NICODERM CQ - DOSED IN MG/24 HR) 7 mg/24hr patch Place 1 patch (7 mg total) onto the skin daily. After using 14 mg patch   omalizumab (XOLAIR) 150 MG/ML prefilled syringe Inject 300 mg into the skin every 28 (twenty-eight) days.   omeprazole (PRILOSEC) 40 MG capsule Take 1 capsule (40 mg total) by mouth daily.   ondansetron (ZOFRAN-ODT) 4 MG disintegrating tablet Take 1 tablet (4 mg total) by mouth every 8 (eight) hours as needed for nausea or vomiting.    oxyCODONE-acetaminophen (PERCOCET) 10-325 MG tablet Take 1 tablet by mouth 3 (three) times daily as needed for pain.   QUEtiapine (SEROQUEL XR) 400 MG 24 hr tablet Take 400 mg by mouth at bedtime.   scopolamine (TRANSDERM-SCOP) 1 MG/3DAYS Place 1 patch (1.5 mg total) onto the skin every 3 (three) days. 4 hours before you get on boat   topiramate (TOPAMAX) 100 MG tablet Take 100 mg by mouth at bedtime.   valACYclovir (VALTREX) 1000 MG tablet Take 1 tablet (1,000 mg total) by mouth 2 (two) times daily. for 7 days prn   vitamin B-12 (CYANOCOBALAMIN) 1000 MCG tablet Take 1,000 mcg by mouth daily.   No facility-administered encounter medications on file as of 06/19/2022.    Allergies (verified) Cozaar [losartan], Keflex [cephalexin], Penicillins, Zestril [lisinopril], Hctz [hydrochlorothiazide], Bee venom, and Metformin and related   History: Past Medical History:  Diagnosis Date   Allergic rhinitis    Anxiety    Arthritis of knee, right    Asthma    Bipolar disorder ([place])    Chronic low back pain    CTS (carpal tunnel syndrome)    hands   Depression    Gallstones    GERD (gastroesophageal reflux disease)    History of prediabetes    Hypertension    Laceration of right foot    Onychomycosis    OSA on CPAP    does not use cpap   Overactive bladder    Pre-diabetes    diet controlled   PVC's (premature ventricular contractions)    PVC's (premature ventricular contractions)    Dr. Raliegh Ip cards   Seizures [facility])    8th grade   Past Surgical History:  Procedure Laterality Date   ABDOMINAL HYSTERECTOMY  07/08/2011   CARPAL TUNNEL RELEASE     right hand 1996   CESAREAN SECTION     2012   CHOLECYSTECTOMY N/A 01/12/2018   Procedure: LAPAROSCOPIC CHOLECYSTECTOMY WITH INTRAOPERATIVE CHOLANGIOGRAM ERAS PATHWAY;  Surgeon: Johnathan Hausen, MD;  Location: [facility] ORS;  Service: General;  Laterality: N/A;   COLONOSCOPY WITH PROPOFOL N/A 04/15/2019   Procedure: COLONOSCOPY WITH PROPOFOL;  Surgeon:  Jonathon Bellows, MD;  Location: [facility] ENDOSCOPY;  Service: Gastroenterology;  Laterality: N/A;   DILATION AND CURETTAGE OF UTERUS     04/2011 benign endometrial polyp squamous metaplasia, inflamm, blood, mucous, benign    ESOPHAGOGASTRODUODENOSCOPY (EGD) WITH PROPOFOL N/A 10/23/2020   Procedure: ESOPHAGOGASTRODUODENOSCOPY (EGD) WITH PROPOFOL;  Surgeon: Jonathon Bellows, MD;  Location: [facility] ENDOSCOPY;  Service: Gastroenterology;  Laterality: N/A;   FLEXIBLE SIGMOIDOSCOPY N/A 02/15/2021   Procedure: FLEXIBLE SIGMOIDOSCOPY;  Surgeon: Jonathon Bellows, MD;  Location: [facility] ENDOSCOPY;  Service: Gastroenterology;  Laterality: N/A;  FOOT SURGERY     Dr. Prudence Davidson    LEEP     2006   PANNICULECTOMY N/A 04/24/2022   Procedure: INFRAUMBILICAL PANNICULECTOMY;  Surgeon: Wallace Going, DO;  Location: [place];  Service: Plastics;  Laterality: N/A;   TUBAL LIGATION     Family History  Problem Relation Age of Onset   Heart failure Mother    Hypertension Mother    Early death Mother    Heart disease Mother        chf   Cancer Father        esophageal    Hypertension Father    Breast cancer Sister 65   Cancer Sister        breast   Depression Sister    Mental illness Sister    Asthma Son    Cancer Other        brain cancer   Cancer Paternal Aunt        brain cancer   Social History   Socioeconomic History   Marital status: Married    Spouse name: Not on file   Number of children: Not on file   Years of education: Not on file   Highest education level: Not on file  Occupational History   Not on file  Tobacco Use   Smoking status: Never   Smokeless tobacco: Former    Types: Snuff   Tobacco comments:    Is in process of decreasing amount  Vaping Use   Vaping Use: Never used  Substance and Sexual Activity   Alcohol use: No    Alcohol/week: 0.0 standard drinks of alcohol   Drug use: No   Sexual activity: Not Currently    Birth control/protection: Surgical    Comment: Hysterectomy  Other Topics  Concern   Not on file  Social History Narrative   Married    No guns    Wears seat belt    Safe in relationship    Never smoker    2 sons Gaspar Bidding ~10 and another son ~28 as of 10/2019   1 granson eli 1 y.o as of 10/2019   2 sisters    Unemployed       Social Determinants of Health   Financial Resource Strain: Low Risk  (06/19/2022)   Overall Financial Resource Strain (CARDIA)    Difficulty of Paying Living Expenses: Not hard at all  Food Insecurity: No Food Insecurity (06/19/2022)   Hunger Vital Sign    Worried About Running Out of Food in the Last Year: Never true    Ran Out of Food in the Last Year: Never true  Transportation Needs: No Transportation Needs (06/19/2022)   PRAPARE - Hydrologist (Medical): No    Lack of Transportation (Non-Medical): No  Physical Activity: Insufficiently Active (07/07/2019)   Exercise Vital Sign    Days of Exercise per Week: 3 days    Minutes of Exercise per Session: 30 min  Stress: No Stress Concern Present (06/19/2022)   [place]    Feeling of Stress : Not at all  Social Connections: Unknown (06/19/2022)   Social Connection and Isolation Panel [NHANES]    Frequency of Communication with Friends and Family: More than three times a week    Frequency of Social Gatherings with Friends and Family: More than three times a week    Attends Religious Services: Not on Diplomatic Services operational officer of [employer]  or Organizations: Not on file    Attends Archivist Meetings: Not on file    Marital Status: Married    Tobacco Counseling Counseling given: Not Answered Tobacco comments: Is in process of decreasing amount   Clinical Intake: Pre-visit preparation completed: Yes        Diabetes:  (Hx of prediabetes. Followed by PCP.)  How often do you need to have someone help you when you read instructions, pamphlets, or other written materials from your doctor  or pharmacy?: 1 - Never  Interpreter Needed?: No    Activities of Daily Living    06/19/2022    9:34 AM 04/24/2022   12:19 PM  In your present state of health, do you have any difficulty performing the following activities:  Hearing? 1   Comment Hearing aids   Vision? 0   Difficulty concentrating or making decisions? 1   Comment Notes some difficulty with memory. Declines additional follow up at this time.   Walking or climbing stairs? 0   Dressing or bathing? 0   Doing errands, shopping? 0 0  Preparing Food and eating ? N   Using the Toilet? N   In the past six months, have you accidently leaked urine? N   Do you have problems with loss of bowel control? N   Managing your Medications? N   Managing your Finances? N   Housekeeping or managing your Housekeeping? N     Patient Care Team: McLean-Scocuzza, Nino Glow, MD as PCP - General (Internal Medicine)  Indicate any recent Medical Services you may have received from other than [facility] providers in the past year (date may be approximate).     Assessment:   This is a routine wellness examination for [place].  Hearing/Vision screen Hearing Screening - Comments:: Hearing aid, bilateral  Vision Screening - Comments:: Followed by [facility]  Wears corrective lenses  No retinopathy reported  They have seen their ophthalmologist in the last 12 months  Dietary issues and exercise activities discussed: Current Exercise Habits: The patient does not participate in regular exercise at present Regular diet   Goals Addressed               This Visit's Progress     Patient Stated     Weight (lb) < 200 lb (90.7 kg) (pt-stated)   222 lb 6.4 oz (100.9 kg)     Weight goal 180lb       Depression Screen    06/19/2022    9:34 AM 05/28/2022    2:40 PM 05/06/2022    8:31 AM 04/04/2022    2:16 PM 12/13/2021    9:37 AM 08/28/2021   11:18 AM 02/25/2021    9:47 AM  PHQ 2/9 Scores  PHQ - 2 Score 0 1  0 2 0 0  PHQ- 9 Score     9     Exception Documentation   Patient refusal        Fall Risk    06/19/2022    9:33 AM 05/28/2022    2:39 PM 05/06/2022    8:31 AM 04/04/2022    2:16 PM 02/11/2022   10:44 AM  [place] in the past year? 0 0 0 0 0  Number falls in past yr: 0 0  0 0  Injury with Fall? 0 0  0 0  Risk for fall due to :  No Fall Risks  No Fall Risks No Fall Risks  Follow up Falls evaluation  completed Falls evaluation completed  Falls evaluation completed Falls evaluation completed    FALL RISK PREVENTION PERTAINING TO THE HOME: Home free of loose throw rugs in walkways, pet beds, electrical cords, etc? Yes  Adequate lighting in your home to reduce risk of falls? Yes   ASSISTIVE DEVICES UTILIZED TO PREVENT FALLS: Life alert? No  Use of a cane, walker or w/c? No  Grab bars in the bathroom? No  Shower chair or bench in shower? No  Elevated toilet seat or a handicapped toilet? No   TIMED UP AND GO: Was the test performed? Yes .  Length of time to ambulate 10 feet: 10 sec.   Gait steady and fast without use of assistive device  Cognitive Function:  Patient declines additional follow up at this time regarding any difficulties with 6CIT.      06/19/2022    9:38 AM 10/01/2020   10:56 AM 07/07/2019   11:35 AM  6CIT Screen  What Year? 0 points 0 points 0 points  What month? 0 points 0 points 0 points  What time? 0 points 0 points 0 points  Count back from 20 0 points 0 points 0 points  Months in reverse 4 points 4 points 0 points  Repeat phrase 4 points 4 points   Total Score 8 points 8 points     Immunizations Immunization History  Administered Date(s) Administered   Influenza,inj,Quad PF,6+ Mos 07/12/2016, 06/09/2019, 08/01/2021   Influenza-Unspecified 05/06/2020   PFIZER(Purple Top)SARS-COV-2 Vaccination 01/17/2020, 02/07/2020   Pneumococcal Conjugate-13 09/10/2021   Pneumococcal Polysaccharide-23 07/12/2016   Tdap 04/17/2009, 11/24/2021   Zoster Recombinat (Shingrix) 08/01/2021,  11/24/2021   Covid-19 vaccine status: Completed vaccines x2.  Screening Tests Health Maintenance  Topic Date Due   COVID-19 Vaccine (3 - Pfizer risk series) 07/05/2022 (Originally 03/06/2020)   INFLUENZA VACCINE  01/04/2023 (Originally 05/06/2022)   PAP SMEAR-Modifier  11/28/2022   Diabetic kidney evaluation - Urine ACR  05/15/2023   Diabetic kidney evaluation - GFR measurement  05/18/2023   MAMMOGRAM  08/06/2023   COLONOSCOPY (Pts 45-59yr Insurance coverage will need to be confirmed)  04/14/2029   TETANUS/TDAP  11/25/2031   Hepatitis C Screening  Completed   HIV Screening  Completed   Zoster Vaccines- Shingrix  Completed   HPV VACCINES  Aged Out   Health Maintenance There are no preventive care reminders to display for this patient.  Lung Cancer Screening: (Low Dose CT Chest recommended if Age 54-80years, 30 pack-year currently smoking OR have quit w/in 15years.) does not qualify.   Vision Screening: Recommended annual ophthalmology exams for early detection of glaucoma and other disorders of the eye.  Dental Screening: Recommended annual dental exams for proper oral hygiene  Community Resource Referral / Chronic Care Management: CRR required this visit?  No   CCM required this visit?  No      Plan:     I have personally reviewed and noted the following in the patient's chart:   Medical and social history Use of alcohol, tobacco or illicit drugs  Current medications and supplements including opioid prescriptions. Patient is not currently taking opioid prescriptions. Functional ability and status Nutritional status Physical activity Advanced directives List of other physicians Hospitalizations, surgeries, and ER visits in previous 12 months Vitals Screenings to include cognitive, depression, and falls Referrals and appointments  In addition, I have reviewed and discussed with patient certain preventive protocols, quality metrics, and best practice recommendations.  A written personalized care plan for preventive services as  well as general preventive health recommendations were provided to patient.     Varney Biles, LPN   [id number]

## 2022-06-25 ENCOUNTER — Ambulatory Visit (INDEPENDENT_AMBULATORY_CARE_PROVIDER_SITE_OTHER): Payer: Medicare Other | Admitting: Internal Medicine

## 2022-06-25 ENCOUNTER — Encounter: Payer: Self-pay | Admitting: Internal Medicine

## 2022-06-25 VITALS — BP 136/80 | HR 83 | Temp 97.8°F | Ht 63.0 in | Wt 218.2 lb

## 2022-06-25 DIAGNOSIS — Z23 Encounter for immunization: Secondary | ICD-10-CM

## 2022-06-25 DIAGNOSIS — Z6838 Body mass index (BMI) 38.0-38.9, adult: Secondary | ICD-10-CM

## 2022-06-25 NOTE — Patient Instructions (Addendum)
Dr. Briscoe [facility] [place], [place] [address] Call us [phone] Our Hours Monday-Thursday 7:00 am - 5:00 pm Friday: Closed  Consider new hepatitis B vaccine (here or pharmacy) x 2 doses 1 month apart

## 2022-06-25 NOTE — Progress Notes (Signed)
Chief Complaint  Patient presents with   Medication Problem    Pt would like like to discuss mounjaro   F/u  1. Obesity bmi > 38 wants to disc mounjaro on antipsychotics and on victoza 1.8 qd insurance denied wegovy/saxenda will refer to wt loss clinic  2. Flu shot today     Review of Systems  Constitutional:  Negative for weight loss.  HENT:  Negative for hearing loss.   Eyes:  Negative for blurred vision.  Respiratory:  Negative for shortness of breath.   Cardiovascular:  Negative for chest pain.  Gastrointestinal:  Negative for abdominal pain and blood in stool.  Genitourinary:  Negative for dysuria.  Musculoskeletal:  Negative for falls and joint pain.  Skin:  Negative for rash.  Neurological:  Negative for headaches.  Psychiatric/Behavioral:  Negative for depression.    Past Medical History:  Diagnosis Date   Allergic rhinitis    Anxiety    Arthritis of knee, right    Asthma    Bipolar disorder ([place])    Chronic low back pain    CTS (carpal tunnel syndrome)    hands   Depression    Gallstones    GERD (gastroesophageal reflux disease)    History of prediabetes    Hypertension    Laceration of right foot    Onychomycosis    OSA on CPAP    does not use cpap   Overactive bladder    Pre-diabetes    diet controlled   PVC's (premature ventricular contractions)    PVC's (premature ventricular contractions)    Dr. Raliegh Ip cards   Seizures [facility])    8th grade   Past Surgical History:  Procedure Laterality Date   ABDOMINAL HYSTERECTOMY  07/08/2011   CARPAL TUNNEL RELEASE     right hand 1996   CESAREAN SECTION     2012   CHOLECYSTECTOMY N/A 01/12/2018   Procedure: LAPAROSCOPIC CHOLECYSTECTOMY WITH INTRAOPERATIVE CHOLANGIOGRAM ERAS PATHWAY;  Surgeon: Johnathan Hausen, MD;  Location: [facility];  Service: General;  Laterality: N/A;   COLONOSCOPY WITH PROPOFOL N/A 04/15/2019   Procedure: COLONOSCOPY WITH PROPOFOL;  Surgeon: Jonathon Bellows, MD;  Location: [facility] ENDOSCOPY;  Service:  Gastroenterology;  Laterality: N/A;   DILATION AND CURETTAGE OF UTERUS     04/2011 benign endometrial polyp squamous metaplasia, inflamm, blood, mucous, benign    ESOPHAGOGASTRODUODENOSCOPY (EGD) WITH PROPOFOL N/A 10/23/2020   Procedure: ESOPHAGOGASTRODUODENOSCOPY (EGD) WITH PROPOFOL;  Surgeon: Jonathon Bellows, MD;  Location: [facility] ENDOSCOPY;  Service: Gastroenterology;  Laterality: N/A;   FLEXIBLE SIGMOIDOSCOPY N/A 02/15/2021   Procedure: FLEXIBLE SIGMOIDOSCOPY;  Surgeon: Jonathon Bellows, MD;  Location: [facility] ENDOSCOPY;  Service: Gastroenterology;  Laterality: N/A;   FOOT SURGERY     Dr. Prudence Davidson    LEEP     2006   PANNICULECTOMY N/A 04/24/2022   Procedure: INFRAUMBILICAL PANNICULECTOMY;  Surgeon: Wallace Going, DO;  Location: [place];  Service: Plastics;  Laterality: N/A;   TUBAL LIGATION     Family History  Problem Relation Age of Onset   Heart failure Mother    Hypertension Mother    Early death Mother    Heart disease Mother        chf   Cancer Father        esophageal    Hypertension Father    Breast cancer Sister 56   Cancer Sister        breast   Depression Sister    Mental illness Sister    Asthma Son  Cancer Other        brain cancer   Cancer Paternal Aunt        brain cancer   Social History   Socioeconomic History   Marital status: Married    Spouse name: Not on file   Number of children: Not on file   Years of education: Not on file   Highest education level: Not on file  Occupational History   Not on file  Tobacco Use   Smoking status: Never   Smokeless tobacco: Former    Types: Snuff   Tobacco comments:    Is in process of decreasing amount  Vaping Use   Vaping Use: Never used  Substance and Sexual Activity   Alcohol use: No    Alcohol/week: 0.0 standard drinks of alcohol   Drug use: No   Sexual activity: Not Currently    Birth control/protection: Surgical    Comment: Hysterectomy  Other Topics Concern   Not on file  Social History Narrative    Married    No guns    Wears seat belt    Safe in relationship    Never smoker    2 sons Gaspar Bidding ~10 and another son ~28 as of 10/2019   1 granson eli 1 y.o as of 10/2019   2 sisters    Unemployed       Social Determinants of Health   Financial Resource Strain: Low Risk  (06/19/2022)   Overall Financial Resource Strain (CARDIA)    Difficulty of Paying Living Expenses: Not hard at all  Food Insecurity: No Food Insecurity (06/19/2022)   Hunger Vital Sign    Worried About Running Out of Food in the Last Year: Never true    Ran Out of Food in the Last Year: Never true  Transportation Needs: No Transportation Needs (06/19/2022)   PRAPARE - Hydrologist (Medical): No    Lack of Transportation (Non-Medical): No  Physical Activity: Insufficiently Active (07/07/2019)   Exercise Vital Sign    Days of Exercise per Week: 3 days    Minutes of Exercise per Session: 30 min  Stress: No Stress Concern Present (06/19/2022)   [place]    Feeling of Stress : Not at all  Social Connections: Unknown (06/19/2022)   Social Connection and Isolation Panel [NHANES]    Frequency of Communication with Friends and Family: More than three times a week    Frequency of Social Gatherings with Friends and Family: More than three times a week    Attends Religious Services: Not on file    Active Member of [place] or Organizations: Not on file    Attends Archivist Meetings: Not on file    Marital Status: Married  Intimate Partner Violence: Not At Risk (06/19/2022)   Humiliation, Afraid, Rape, and Kick questionnaire    Fear of Current or Ex-Partner: No    Emotionally Abused: No    Physically Abused: No    Sexually Abused: No   Current Meds  Medication Sig   ACCU-CHEK GUIDE test strip Use as instructed   albuterol (VENTOLIN HFA) 108 (90 Base) MCG/ACT inhaler Inhale 1-2 puffs into the lungs every 6 (six) hours as  needed for wheezing or shortness of breath.   amLODipine (NORVASC) 5 MG tablet Take 1 tablet (5 mg total) by mouth daily.   benztropine (COGENTIN) 1 MG tablet Take 1 mg by mouth 2 (two) times daily.  Blood Glucose Monitoring Suppl (ACCU-CHEK GUIDE ME) w/Device KIT Use as directed,once daily to test blood sugar   buPROPion (WELLBUTRIN XL) 150 MG 24 hr tablet Take 150 mg by mouth every morning.   celecoxib (CELEBREX) 200 MG capsule Take 1 capsule by mouth 2 (two) times daily as needed (pain).   cetirizine (ZYRTEC ALLERGY) 10 MG tablet Take 2 tablets (20 mg total) by mouth daily.   EASY COMFORT PEN NEEDLES 31G X 8 MM MISC INJECT VICTOZA ONCE DAILY AS DIRECTED   EPINEPHrine 0.3 mg/0.3 mL IJ SOAJ injection Inject 0.3 mg into the muscle as needed for anaphylaxis.   famotidine (PEPCID) 40 MG tablet Take 1 tablet (40 mg total) by mouth daily.   fexofenadine (ALLEGRA) 180 MG tablet Take 2 tablets (360 mg total) by mouth every morning. Prn only if needed either take zyrtec or allegra explain to patient   FLUoxetine (PROZAC) 40 MG capsule Take 40 mg by mouth daily.   fluPHENAZine (PROLIXIN) 2.5 MG tablet Take 2.5 mg by mouth 2 (two) times daily.   fluticasone (FLONASE) 50 MCG/ACT nasal spray Place 2 sprays into both nostrils daily.   furosemide (LASIX) 20 MG tablet Take 0.5-1 tablets (10-20 mg total) by mouth daily as needed. (Patient taking differently: Take 20 mg by mouth daily as needed.)   hydrocortisone 2.5 % ointment Apply topically 2 (two) times daily. Neck   hydrOXYzine (ATARAX) 50 MG tablet Take 50 mg by mouth 4 (four) times daily as needed for anxiety.   Insulin Pen Needle (NOVOFINE) 30G X 8 MM MISC Use with victoza daily 1.8 mg   liraglutide (VICTOZA) 18 MG/3ML SOPN Inject 1.8 mg into the skin daily.   metoprolol succinate (TOPROL-XL) 25 MG 24 hr tablet Take 1 tablet (25 mg total) by mouth daily.   montelukast (SINGULAIR) 10 MG tablet Take 1 tablet (10 mg total) by mouth daily.   Multiple  Vitamins-Minerals (ALIVE MULTI-VITAMIN PO) Take by mouth daily in the afternoon.   nicotine (NICODERM CQ - DOSED IN MG/24 HOURS) 14 mg/24hr patch Place 1 patch (14 mg total) onto the skin daily. X 45 days then go down to 7 mg patch   nicotine (NICODERM CQ - DOSED IN MG/24 HR) 7 mg/24hr patch Place 1 patch (7 mg total) onto the skin daily. After using 14 mg patch   omeprazole (PRILOSEC) 40 MG capsule Take 1 capsule (40 mg total) by mouth daily.   ondansetron (ZOFRAN-ODT) 4 MG disintegrating tablet Take 1 tablet (4 mg total) by mouth every 8 (eight) hours as needed for nausea or vomiting.   oxyCODONE-acetaminophen (PERCOCET) 10-325 MG tablet Take 1 tablet by mouth 3 (three) times daily as needed for pain.   QUEtiapine (SEROQUEL XR) 400 MG 24 hr tablet Take 400 mg by mouth at bedtime.   scopolamine (TRANSDERM-SCOP) 1 MG/3DAYS Place 1 patch (1.5 mg total) onto the skin every 3 (three) days. 4 hours before you get on boat   topiramate (TOPAMAX) 100 MG tablet Take 100 mg by mouth at bedtime.   valACYclovir (VALTREX) 1000 MG tablet Take 1 tablet (1,000 mg total) by mouth 2 (two) times daily. for 7 days prn   vitamin B-12 (CYANOCOBALAMIN) 1000 MCG tablet Take 1,000 mcg by mouth daily.   Allergies  Allergen Reactions   Cozaar [Losartan] Hives and Swelling    Angioedema   Keflex [Cephalexin] Swelling and Rash   Penicillins Rash    Has patient had a PCN reaction causing immediate rash, facial/tongue/throat swelling, SOB or lightheadedness  with hypotension: Yes Has patient had a PCN reaction causing severe rash involving mucus membranes or skin necrosis: No Has patient had a PCN reaction that required hospitalization: No Has patient had a PCN reaction occurring within the last 10 years: No If all of the above answers are "NO", then may proceed with Cephalosporin use.    Zestril [Lisinopril] Hives and Other (See Comments)    Headache Low BP Angioedema     Hctz [Hydrochlorothiazide] Swelling and Rash    Bee Venom Swelling   Metformin And Related Rash   Recent Results (from the past 2160 hour(s))  Basic metabolic panel per protocol     Status: None   Collection Time: 04/24/22 12:20 PM  Result Value Ref Range   Sodium 141 135 - 145 mmol/L   Potassium 4.0 3.5 - 5.1 mmol/L   Chloride 109 98 - 111 mmol/L   CO2 24 22 - 32 mmol/L   Glucose, Bld 95 70 - 99 mg/dL    Comment: Glucose reference range applies only to samples taken after fasting for at least 8 hours.   BUN 8 6 - 20 mg/dL   Creatinine, Ser 0.96 0.44 - 1.00 mg/dL   Calcium 9.2 8.9 - 10.3 mg/dL   GFR, Estimated >60 >60 mL/min    Comment: (NOTE) Calculated using the CKD-EPI Creatinine Equation (2021)    Anion gap 8 5 - 15    Comment: Performed at [facility] [address], [place], [place] [address]  CBC per protocol     Status: Abnormal   Collection Time: 04/24/22 12:20 PM  Result Value Ref Range   WBC 5.2 4.0 - 10.5 K/uL   RBC 3.82 (L) 3.87 - 5.11 MIL/uL   Hemoglobin 11.4 (L) 12.0 - 15.0 g/dL   HCT 31.5 (L) 36.0 - 46.0 %   MCV 82.5 80.0 - 100.0 fL   MCH 29.8 26.0 - 34.0 pg   MCHC 36.2 (H) 30.0 - 36.0 g/dL   RDW 14.7 11.5 - 15.5 %   Platelets 204 150 - 400 K/uL   nRBC 0.0 0.0 - 0.2 %    Comment: Performed at [facility], [place] [address] [place], [place] [address]  Surgical pathology     Status: None   Collection Time: 04/24/22  3:01 PM  Result Value Ref Range   SURGICAL PATHOLOGY      SURGICAL PATHOLOGY CASE: [id number] PATIENT: Andora Ballin Surgical Pathology Report     Clinical History: panniculitis (cm)     FINAL MICROSCOPIC DIAGNOSIS:  A. PANNICULUS, PANNICULECTOMY: -  Unremarkable skin and fragments of adipose tissue with no significant pathology.   GROSS DESCRIPTION:  Received fresh is a 1533 g, 55 x 12 cm elliptical tan-brown unremarkable skin with underlying soft lobulated fatty tissue up to 6 cm in depth. Also included with the specimen are several separate soft  lobulated fatty tissues, 7.6 x 5.6 x 2.2 cm in aggregate.  Sections through the skin with underlying fatty tissue and separate fatty tissues are unremarkable.  Representative sections are submitted 1 block.  SW 04/25/2022   Final Diagnosis performed by Tilford Pillar DO.   Electronically signed 04/28/2022 Technical component performed at [employer]. [facility], [place] [address], [place], [place] [address].  Professional component performed at [facility], [place] [address], [place], [place] [address].  Immunohistochemistry Technical component (if applicable) was performed at [facility]. [address] [place], [place],  [address].  IMMUNOHISTOCHEMISTRY DISCLAIMER (if applicable): Some of these immunohistochemical stains may have been developed and the performance characteristics determine by [facility]. Some may not have been cleared or approved by the U.S. Food and Drug Administration. The FDA has determined that such clearance or approval is not necessary. This test is used for clinical purposes. It should not be regarded as investigational or for research. This laboratory is certified under the [place] (CLIA-88) as qualified to perform high complexity clinical laboratory testing.  The controls stained appropriately.   CBC with Differential     Status: Abnormal   Collection Time: 05/17/22 12:47 PM  Result Value Ref Range   WBC 7.9 4.0 - 10.5 K/uL   RBC 3.27 (L) 3.87 - 5.11 MIL/uL   Hemoglobin 9.3 (L) 12.0 - 15.0 g/dL   HCT 26.6 (L) 36.0 - 46.0 %   MCV 81.3 80.0 - 100.0 fL   MCH 28.4 26.0 - 34.0 pg   MCHC 35.0 30.0 - 36.0 g/dL   RDW 13.4 11.5 - 15.5 %   Platelets 288 150 - 400 K/uL   nRBC 0.0 0.0 - 0.2 %   Neutrophils Relative % 67 %   Neutro Abs 5.3 1.7 - 7.7 K/uL   Lymphocytes Relative 21 %   Lymphs Abs 1.6 0.7 - 4.0 K/uL   Monocytes Relative 8 %   Monocytes  Absolute 0.6 0.1 - 1.0 K/uL   Eosinophils Relative 3 %   Eosinophils Absolute 0.2 0.0 - 0.5 K/uL   Basophils Relative 1 %   Basophils Absolute 0.1 0.0 - 0.1 K/uL   Immature Granulocytes 0 %   Abs Immature Granulocytes 0.02 0.00 - 0.07 K/uL    Comment: Performed at [facility], [address] [place], [place] [address]  Comprehensive metabolic panel     Status: Abnormal   Collection Time: 05/17/22 12:47 PM  Result Value Ref Range   Sodium 139 135 - 145 mmol/L   Potassium 3.5 3.5 - 5.1 mmol/L   Chloride 108 98 - 111 mmol/L   CO2 25 22 - 32 mmol/L   Glucose, Bld 89 70 - 99 mg/dL    Comment: Glucose reference range applies only to samples taken after fasting for at least 8 hours.   BUN 11 6 - 20 mg/dL   Creatinine, Ser 1.06 (H) 0.44 - 1.00 mg/dL   Calcium 8.9 8.9 - 10.3 mg/dL   Total Protein 7.3 6.5 - 8.1 g/dL   Albumin 3.4 (L) 3.5 - 5.0 g/dL   AST 16 15 - 41 U/L   ALT 13 0 - 44 U/L   Alkaline Phosphatase 65 38 - 126 U/L   Total Bilirubin 0.4 0.3 - 1.2 mg/dL   GFR, Estimated >60 >60 mL/min    Comment: (NOTE) Calculated using the CKD-EPI Creatinine Equation (2021)    Anion gap 6 5 - 15    Comment: Performed at [facility], [address] [place], [place] [address]   Objective  Body mass index is 38.65 kg/m. Wt Readings from Last 3 Encounters:  06/25/22 218 lb 3.2 oz (99 kg)  06/19/22 222 lb 6.4 oz (100.9 kg)  05/28/22 229 lb 12.8 oz (104.2 kg)   Temp Readings from Last 3 Encounters:  06/25/22 97.8 F (36.6 C) (Oral)  06/19/22 98.3 F (36.8 C)  05/28/22 98.6 F (37 C) (Oral)   BP Readings from Last 3 Encounters:  06/25/22 136/80  06/19/22 128/82  05/28/22 (!) 140/80  Pulse Readings from Last 3 Encounters:  06/25/22 83  06/19/22 97  05/28/22 100    Physical Exam Vitals and nursing note reviewed.  Constitutional:      Appearance: Normal appearance. She is well-developed and well-groomed.  HENT:     Head: Normocephalic and atraumatic.   Eyes:     Conjunctiva/sclera: Conjunctivae normal.     Pupils: Pupils are equal, round, and reactive to light.  Cardiovascular:     Rate and Rhythm: Normal rate and regular rhythm.     Heart sounds: Normal heart sounds. No murmur heard. Pulmonary:     Effort: Pulmonary effort is normal.     Breath sounds: Normal breath sounds.  Abdominal:     General: Abdomen is flat. Bowel sounds are normal.     Tenderness: There is no abdominal tenderness.  Musculoskeletal:        General: No tenderness.  Skin:    General: Skin is warm and dry.  Neurological:     General: No focal deficit present.     Mental Status: She is alert and oriented to person, place, and time. Mental status is at baseline.     Cranial Nerves: Cranial nerves 2-12 are intact.     Motor: Motor function is intact.     Coordination: Coordination is intact.     Gait: Gait is intact.  Psychiatric:        Attention and Perception: Attention and perception normal.        Mood and Affect: Mood and affect normal.        Speech: Speech normal.        Behavior: Behavior normal. Behavior is cooperative.        Thought Content: Thought content normal.        Cognition and Memory: Cognition and memory normal.        Judgment: Judgment normal.     Assessment  Plan  BMI 38.0-38.9,adult - Plan: Amb Ref to Medical Weight Management  Class 2 severe obesity due to excess calories with serious comorbidity and body mass index (BMI) of 38.0 to 38.9 in adult ([place]) - Plan: Amb Ref to Medical Weight Management  Need for immunization against influenza - Plan: Flu Vaccine QUAD 75moIM (Fluarix, Fluzone & Alfiuria Quad PF)  HM Flu shot given today Tdap 11/24/21 Pfizer 2/2 consider booster rec  Prevnar 13 09/2021, prevnar 20 due in 5 years  Pna 23 07/12/16 2/2 shingrix  Not immune MMR, hep B titer consider in the future  915/21 house calls visit A1c 6.1    h/o supracervical hysterectomy ([facility]), pap 02/29/16 negative no comment HPV,  another pap 06/23/17 neg pap neg HPV -h/o LEEP 2006 11/29/19 negative pap    mammo 08/04/19 negative ordered 08/22/20 negative ordered 2022  08/05/21 negative ordered 07/2022    Colonoscopy 04/15/19 normal f/u in 10 years egd sch 10/23/20 Dr. AVicente Malesneg bxs 10/23/20 aperistalsis and food + consider gastric emptying study and esophageal mamometry  10/25/20 Dr. KBailey Mech Dr SJacklynn Lewis    Echo Dr. KNehemiah Massed-nl LV function EF 60% moderate mitral insuff and mild tricuspid insufficiency treadmill stress test neg 07/30/13  -h/o PVCS   Hep C neg 08/17/20 HIV neg 02/04/16 Rec healthy diet and exercise    Ob/gyn appt pending bladder leakage I.e bladder sling vs other [facility] in PT (stopped PT w/o help worsen sneezing or coughing)  -Dr. SGeorgianne Fickpt wants surgery as of 04/20/20 sx's better and no surgery indicated    Consider referral  repeat sleep study h/o OSA f/u Dr. Kathyrn Sheriff and will get procedure for OSA as of 04/17/21       Provider: Dr. Olivia Mackie McLean-Scocuzza-Internal Medicine

## 2022-06-26 ENCOUNTER — Ambulatory Visit (INDEPENDENT_AMBULATORY_CARE_PROVIDER_SITE_OTHER): Payer: Medicare Other | Admitting: Student

## 2022-06-26 ENCOUNTER — Encounter: Payer: Self-pay | Admitting: Student

## 2022-06-26 DIAGNOSIS — M793 Panniculitis, unspecified: Secondary | ICD-10-CM

## 2022-06-26 NOTE — Progress Notes (Signed)
Patient is a 56 year old female who underwent panniculectomy with Dr. Marla Roe on 05/02/2022.  Patient presents to the clinic today for postoperative follow-up.  Patient was last seen in the clinic on 05/27/2022.  At this visit, she was doing well.  She did have some swelling and bruising but the majority of it has resolved.  Plan was for patient to follow-up in 1 month.  Today, patient reports she is doing well.  She states that she is very happy with her outcome.  She has not new specific complaints or concerns.  She states that she has been putting Vaseline over her incision daily.  She denies any fevers or chills.  She denies any nausea or vomiting.  Patient does report she sometimes has some pain well urinating.  Chaperone present on exam.  On exam, patient is in no acute distress.  Abdomen is soft and nontender.  There is no overlying ecchymosis or erythema.  There are no significant fluid collections palpated on exam.  Incision appears to be intact and healing well.  There are no wounds noted to the incision.  There is some firmness underneath the incision consistent with scarring.  I discussed with the patient that she should gently massage her scars to help with the firmness.  I also discussed with the patient that she may start using scar creams such as Mederma, Skinuva or Silagen on her incision to help with scarring.  I recommended the patient follow-up with her primary care provider regarding her pain while urinating.  Patient to follow-up in the clinic in 3 weeks   Pictures were obtained of the patient and placed in the chart with the patient's or guardian's permission.

## 2022-06-30 ENCOUNTER — Ambulatory Visit: Payer: Medicare Other

## 2022-07-01 ENCOUNTER — Other Ambulatory Visit: Payer: Self-pay | Admitting: Internal Medicine

## 2022-07-01 ENCOUNTER — Encounter: Payer: Self-pay | Admitting: Internal Medicine

## 2022-07-01 NOTE — Telephone Encounter (Signed)
Pt would like to be called. Pt did not state what the call was about

## 2022-07-02 NOTE — Telephone Encounter (Signed)
Patient called and said that no one has called her back about her Ozempic. She would like someone to send a MyChart message when Ozempic has been approved by insurance.

## 2022-07-03 NOTE — Telephone Encounter (Signed)
Patient states she has already spoken with Dr. Olivia Mackie McLean-Scocuzza about Ozempic and she was supposed to get back with her when she heard from her insurance company.  Patient states her insurance company states it is covered.  Patient requests a call back.  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  *Patient states her preferred pharmacy is [facility] [address] in [place].

## 2022-07-03 NOTE — Unmapped (Signed)
Renee Cunningham is a 56 y.o. female with chronic idiopathic urticaria and angioedema who is here for follow up. Since her last appointment on 03/06/2022, she has been doing well. She has been receiving her Xolair at home, but she reports that she has not been on time. Her last Xolair injection was about 3 weeks ago and she has not yet called for her next dose of Xolair to be delivered. She is doing well without symptoms when she takes her Xolair. She wants to see if she can get her doses in our clinic.    She is currently on cetirizine 20 mg twice daily, famotidine 20 mg twice daily, hydroxychloroquine 200 mg twice daily, montelukast 10 mg daily, and hydroxyzine 25 mg twice daily in addition to Xolair 300 mg every 21 days. She has an appointment with ophthalmologist tomorrow.    The patient is tolerating medications  in the accompaniing list.  Current Outpatient Medications   Medication Sig Dispense Refill    ALCOHOL PADS PadM Use as directed,once daily to test blood sugar      amLODIPine (NORVASC) 5 MG tablet       benztropine (COGENTIN) 1 MG tablet Take 1 tablet (1 mg total) by mouth daily.      cetirizine (ZYRTEC) 10 MG tablet TAKE 2 TABLETS BY MOUTH ONCE DAILY AT NIGHT 60 tablet 0    clobetasoL (TEMOVATE) 0.05 % cream Apply 1-2 times a day to the raised itchy spots on your body as needed . 30 g 0    clotrimazole (LOTRIMIN) 1 % cream APPLY TOPICALLY IN THE MORNING AND AT BEDTIME      clotrimazole-betamethasone (LOTRISONE) 1-0.05 % cream APPLY EXTERNALLY TWICE DAILY AS NEEDED FOR SYMPTOMS FOR UP TO 2 WEEKS      clotrimazole-betamethasone (LOTRISONE) 1-0.05 % cream Apply externally BID prn sx up to 2 wks      famotidine (PEPCID) 20 MG tablet 1 tablet (20 mg total) two (2) times a day.      ferrous sulfate 325 (65 FE) MG tablet Take 1 tablet (325 mg total) by mouth in the morning.      FLUoxetine (PROZAC) 10 MG capsule TAKE 1 CAPSULE BY MOUTH IN THE MORNING      fluPHENAZine (PROLIXIN) 2.5 MG tablet Take 1 tablet (2.5 mg total) by mouth daily.      fluticasone propionate (FLONASE) 50 mcg/actuation nasal spray Place 2 sprays into both nostrils daily.      hydrocortisone 2.5 % lotion Apply 1-2 times a day to the raised itchy spots as needed on your face 59 mL 0    hydrOXYchloroQUINE (PLAQUENIL) 200 mg tablet Take 1 tablet (200 mg total) by mouth Two (2) times a day. 60 tablet 12    melatonin 5 mg Tab Take 1 tablet (5 mg total) by mouth nightly.      metoprolol succinate (TOPROL-XL) 25 MG 24 hr tablet       mupirocin (BACTROBAN) 2 % ointment       ONETOUCH ULTRA TEST Strp Use as directed,once daily to test blood sugar      oxyCODONE-acetaminophen (PERCOCET) 10-325 mg per tablet TAKE 1 TABLET BY MOUTH THREE TIMES DAILY AS NEEDED FOR CHRONIC PAIN      topiramate (TOPAMAX) 100 MG tablet TAKE 1 & 1/2 (ONE & ONE-HALF) TABLETS BY MOUTH IN THE EVENING      triamcinolone (KENALOG) 0.1 % cream Apply topically to affected areas twice daily as needed for rash. 454 g 2  triamcinolone (KENALOG) 0.1 % ointment Apply twice a day as needed only, only to areas with active hives breakouts. 80 g 6    VICTOZA 3-PAK 0.6 mg/0.1 mL (18 mg/3 mL) injection Inject 1.8 mg into the skin daily.      EPINEPHrine (EPIPEN) 0.3 mg/0.3 mL injection Inject 0.3 mL (0.3 mg total) into the muscle once for 1 dose. May repeat in 5 minutes if symptoms are not improved or worsening. 1 each PRN    hydrOXYzine (ATARAX) 25 MG tablet Take 1 tablet (25 mg total) by mouth two (2) times a day as needed for itching (hives). 60 tablet 3    montelukast (SINGULAIR) 10 mg tablet Take 1 tablet (10 mg total) by mouth nightly. 90 tablet 3    omalizumab (XOLAIR) 150 mg/mL syringe Inject the contents of 2 syringes (300 mg total) under the skin every twenty-one (21) days. 2 mL 12     No current facility-administered medications for this visit.     Review of Systems:  (Unless otherwise designated above or below,  no additional significant complaint was elicited in any organ system below)  -------------------------------  As per HPI, otherwise review of 10 systems was negative.    The Family History is unchanged from previous visit.  The Past Medical and Surgical Histories are unchanged from previous visit.  The Social History is unchanged from previous visit.    Physical Exam:   Vitals:    07/10/22 1029   BP: 133/76   BP Site: L Arm   BP Position: Sitting   BP Cuff Size: X-Large   Pulse: 95   SpO2: 99%     General : No apparent distress. Awake, alert, well appearing.   HEENT: Normocephalic, atraumatic. No periorbital edema.   Neck: Neck is symmetrical with trachea midline.  Eyes: PERRL. Eyelids and conjunctiva normal bilaterally.   Respiratory: Breathing is unlabored, no tachypnea.  Cardiovascular: No edema, no pallor, no cyanosis.  Abdomen: Non-distended.  Skin: No gross wheals are noted on exam. There is hyperpigmentation on her anterior neck that are not wheals.  Extremities: Normal range of motion observed. No peripheral edema.  Neuro: Mood and behavior appropriate for age.  Musculoskeletal: Symmetric and appropriate movements of extremities.    05/17/22 CBC with diff with anemia, CMP wnl.    Impression:  1. Chronic idiopathic urticaria    2. Angioedema, subsequent encounter    3. Anaphylaxis, initial encounter      Renee Cunningham is a 56 y.o. female with CIU and angioedema who is improved since restarting Xolair, but has not been receiving Xolair doses on time.    Plan:    1. Urticaria and angioedema: The patient will continue on Xolair 300 mg every 21 days which she will continue at home as her insurance will not cover clinic administration. I would like for her to be able to reduce her oral antihistamines as her renal function is decreased. For now she will stop fexofenadine and continue on cetirizine 20 mg twice daily, famotidine 20 mg twice daily, HCQ 200 mg twice daily, and montelukast 10 mg daily and keep up with her eye exams as well. I discussed the potential interaction with HCQ and hydroxyzine and recommendations to decrease use of hydroxyzine. I also provided a new prescription for Epipens to have when she is administering Xolair at home.    2. PCN Allergy: Will be addressed at a future appointment once her hives are under control.    3. Dispo: Return  in about 6 months (around 01/09/2023).    I personally spent 32 minutes face-to-face and non-face-to-face in the care of this patient, which includes all pre, intra, and post visit time on the date of service.  All documented time was specific to the E/M visit and does not include any procedures that may have been performed.    This note was entered by Lauralee Evener, acting as scribe for Manfred Shirts, MD.  Signature: BS  Date: 07/10/22   Time: 11:11 AM    I have reviewed the documentation provided by the scribe and confirm that it accurately reflects the service I personally performed and the decisions made by me.  Signature: Manfred Shirts, MD, PhD  Date: 07/10/22   Time: 11:11 AM

## 2022-07-04 ENCOUNTER — Other Ambulatory Visit: Payer: Self-pay | Admitting: Family

## 2022-07-04 DIAGNOSIS — Z87898 Personal history of other specified conditions: Secondary | ICD-10-CM

## 2022-07-04 DIAGNOSIS — I10 Essential (primary) hypertension: Secondary | ICD-10-CM

## 2022-07-04 NOTE — Telephone Encounter (Signed)
Again another psychiatric medication I have never filled for pt nor will. She has psychiatry to do this

## 2022-07-08 DIAGNOSIS — Z79891 Long term (current) use of opiate analgesic: Secondary | ICD-10-CM | POA: Diagnosis not present

## 2022-07-08 DIAGNOSIS — G894 Chronic pain syndrome: Secondary | ICD-10-CM | POA: Diagnosis not present

## 2022-07-08 DIAGNOSIS — M5451 Vertebrogenic low back pain: Secondary | ICD-10-CM | POA: Diagnosis not present

## 2022-07-08 DIAGNOSIS — M25569 Pain in unspecified knee: Secondary | ICD-10-CM | POA: Diagnosis not present

## 2022-07-08 DIAGNOSIS — Z5181 Encounter for therapeutic drug level monitoring: Secondary | ICD-10-CM | POA: Diagnosis not present

## 2022-07-10 ENCOUNTER — Telehealth: Payer: Self-pay | Admitting: Internal Medicine

## 2022-07-10 ENCOUNTER — Ambulatory Visit (INDEPENDENT_AMBULATORY_CARE_PROVIDER_SITE_OTHER): Payer: Medicare Other | Admitting: Internal Medicine

## 2022-07-10 ENCOUNTER — Ambulatory Visit: Payer: Medicare Other | Admitting: Internal Medicine

## 2022-07-10 ENCOUNTER — Encounter: Payer: Self-pay | Admitting: Internal Medicine

## 2022-07-10 ENCOUNTER — Ambulatory Visit: Admit: 2022-07-10 | Discharge: 2022-07-11 | Payer: MEDICARE

## 2022-07-10 VITALS — BP 130/68 | HR 96 | Temp 98.4°F | Ht 63.0 in | Wt 227.0 lb

## 2022-07-10 DIAGNOSIS — T783XXD Angioneurotic edema, subsequent encounter: Principal | ICD-10-CM

## 2022-07-10 DIAGNOSIS — T782XXA Anaphylactic shock, unspecified, initial encounter: Principal | ICD-10-CM

## 2022-07-10 DIAGNOSIS — L501 Idiopathic urticaria: Principal | ICD-10-CM

## 2022-07-10 DIAGNOSIS — S80812A Abrasion, left lower leg, initial encounter: Secondary | ICD-10-CM | POA: Insufficient documentation

## 2022-07-10 DIAGNOSIS — H269 Unspecified cataract: Secondary | ICD-10-CM

## 2022-07-10 DIAGNOSIS — M25461 Effusion, right knee: Secondary | ICD-10-CM

## 2022-07-10 DIAGNOSIS — Z0000 Encounter for general adult medical examination without abnormal findings: Secondary | ICD-10-CM

## 2022-07-10 DIAGNOSIS — M7121 Synovial cyst of popliteal space [Baker], right knee: Secondary | ICD-10-CM | POA: Diagnosis not present

## 2022-07-10 DIAGNOSIS — R519 Headache, unspecified: Secondary | ICD-10-CM | POA: Diagnosis not present

## 2022-07-10 DIAGNOSIS — R739 Hyperglycemia, unspecified: Secondary | ICD-10-CM

## 2022-07-10 DIAGNOSIS — M1711 Unilateral primary osteoarthritis, right knee: Secondary | ICD-10-CM | POA: Diagnosis not present

## 2022-07-10 DIAGNOSIS — H539 Unspecified visual disturbance: Secondary | ICD-10-CM

## 2022-07-10 DIAGNOSIS — G5603 Carpal tunnel syndrome, bilateral upper limbs: Secondary | ICD-10-CM | POA: Diagnosis not present

## 2022-07-10 DIAGNOSIS — F17221 Nicotine dependence, chewing tobacco, in remission: Secondary | ICD-10-CM | POA: Diagnosis not present

## 2022-07-10 HISTORY — DX: Abrasion, left lower leg, initial encounter: S80.812A

## 2022-07-10 LAB — POCT GLYCOSYLATED HEMOGLOBIN (HGB A1C): Hemoglobin A1C: 5.2 % (ref 4.0–5.6)

## 2022-07-10 MED ORDER — MUPIROCIN 2 % EX OINT: 1.0000 | TOPICAL_OINTMENT | Freq: Two times a day (BID) | CUTANEOUS | 0 refills | Status: DC

## 2022-07-10 MED ORDER — NICOTINE 7 MG/24HR TD PT24: 7.0000 mg | MEDICATED_PATCH | Freq: Every day | TRANSDERMAL | 0 refills | Status: DC

## 2022-07-10 MED ORDER — NICOTINE 21 MG/24HR TD PT24: 21.0000 mg | MEDICATED_PATCH | Freq: Every day | TRANSDERMAL | 0 refills | Status: DC

## 2022-07-10 MED ORDER — NICOTINE 14 MG/24HR TD PT24: 14.0000 mg | MEDICATED_PATCH | Freq: Every day | TRANSDERMAL | 0 refills | Status: DC

## 2022-07-10 MED ORDER — HYDROXYZINE HCL 25 MG TABLET
ORAL_TABLET | Freq: Two times a day (BID) | ORAL | 3 refills | 30 days | Status: CP | PRN
Start: 2022-07-10 — End: 2023-07-10

## 2022-07-10 MED ORDER — OMALIZUMAB 150 MG/ML SUBCUTANEOUS SYRINGE
SUBCUTANEOUS | 12 refills | 21 days | Status: CP
Start: 2022-07-10 — End: ?
  Filled 2022-07-14: qty 2, 21d supply, fill #0

## 2022-07-10 MED ORDER — MONTELUKAST 10 MG TABLET
ORAL_TABLET | Freq: Every evening | ORAL | 3 refills | 90 days | Status: CP
Start: 2022-07-10 — End: 2023-07-10

## 2022-07-10 MED ORDER — EPINEPHRINE 0.3 MG/0.3 ML INJECTION, AUTO-INJECTOR
Freq: Once | INTRAMUSCULAR | PRN refills | 1 days | Status: CP
Start: 2022-07-10 — End: 2022-07-10

## 2022-07-10 NOTE — Telephone Encounter (Signed)
Pt states she needs her CPAP machine still not received from Dr. Kathyrn [facility]  Fax to him

## 2022-07-10 NOTE — Progress Notes (Addendum)
Chief Complaint  Patient presents with   Follow-up    Follow up   Annual  1. Prediabetes history but A1c 5.2 today no prediabetes she does take victoza due to weight gain with antipsychotic meds  2. H/o chewing tobacco use she wants to see if nicotine patches will help her and she is trying to avoid chewing tobacco  3. C/o right knee pain front and back of knee with swelling tried steroid injections but pain comes back with h/o MRI moderate to severe arthritis and mild to moderate effusion and small bakers cyst will refer to [facility] in [place] Dr. Kathlyn [place] pt wants surgery  4. She states patty vision mentioned she had cataracts and vision reduced will refer to [facility]  5. Abrasion to left lower leg  6. OSA f/u Dr. Kathyrn Sheriff Ent but needs CPAP has not had this she has been having snoring and stopping breathing and has  7.c/o b/l cts. EMG/NCS had 2018 with neurology Dr. Melrose Nakayama    Review of Systems  Constitutional:  Negative for weight loss.  HENT:  Negative for hearing loss.   Eyes:  Negative for blurred vision.  Respiratory:  Negative for shortness of breath.   Cardiovascular:  Negative for chest pain.  Gastrointestinal:  Negative for abdominal pain and blood in stool.  Genitourinary:  Negative for dysuria.  Musculoskeletal:  Positive for joint pain. Negative for falls.  Skin:  Negative for rash.  Neurological:  Positive for headaches.  Psychiatric/Behavioral:  Negative for depression.    Past Medical History:  Diagnosis Date   Allergic rhinitis    Anxiety    Arthritis of knee, right    Asthma    Bipolar disorder ([place])    Chronic low back pain    CTS (carpal tunnel syndrome)    hands   Depression    Gallstones    GERD (gastroesophageal reflux disease)    History of prediabetes    Hypertension    Laceration of right foot    Onychomycosis    OSA on CPAP    does not use cpap   Overactive bladder    Pre-diabetes    diet controlled   PVC's (premature ventricular  contractions)    PVC's (premature ventricular contractions)    Dr. Raliegh Ip cards   Seizures [facility])    8th grade   Past Surgical History:  Procedure Laterality Date   ABDOMINAL HYSTERECTOMY  07/08/2011   CARPAL TUNNEL RELEASE     right hand 1996   CESAREAN SECTION     2012   CHOLECYSTECTOMY N/A 01/12/2018   Procedure: LAPAROSCOPIC CHOLECYSTECTOMY WITH INTRAOPERATIVE CHOLANGIOGRAM ERAS PATHWAY;  Surgeon: Johnathan Hausen, MD;  Location: [facility] ORS;  Service: General;  Laterality: N/A;   COLONOSCOPY WITH PROPOFOL N/A 04/15/2019   Procedure: COLONOSCOPY WITH PROPOFOL;  Surgeon: Jonathon Bellows, MD;  Location: [facility] ENDOSCOPY;  Service: Gastroenterology;  Laterality: N/A;   DILATION AND CURETTAGE OF UTERUS     04/2011 benign endometrial polyp squamous metaplasia, inflamm, blood, mucous, benign    ESOPHAGOGASTRODUODENOSCOPY (EGD) WITH PROPOFOL N/A 10/23/2020   Procedure: ESOPHAGOGASTRODUODENOSCOPY (EGD) WITH PROPOFOL;  Surgeon: Jonathon Bellows, MD;  Location: [facility] ENDOSCOPY;  Service: Gastroenterology;  Laterality: N/A;   FLEXIBLE SIGMOIDOSCOPY N/A 02/15/2021   Procedure: FLEXIBLE SIGMOIDOSCOPY;  Surgeon: Jonathon Bellows, MD;  Location: [facility] ENDOSCOPY;  Service: Gastroenterology;  Laterality: N/A;   FOOT SURGERY     Dr. Prudence Davidson    LEEP     2006   PANNICULECTOMY N/A 04/24/2022   Procedure:  INFRAUMBILICAL PANNICULECTOMY;  Surgeon: Dillingham, Claire S, DO;  Location: [facility] OR;  Service: Plastics;  Laterality: N/A;   TUBAL LIGATION     Family History  Problem Relation Age of Onset   Heart failure Mother    Hypertension Mother    Early death Mother    Heart disease Mother        chf   Cancer Father        esophageal    Hypertension Father    Breast cancer Sister 50   Cancer Sister        breast   Depression Sister    Mental illness Sister    Asthma Son    Cancer Other        brain cancer   Cancer Paternal Aunt        brain cancer   Social History   Socioeconomic History   Marital status: Married     Spouse name: Not on file   Number of children: Not on file   Years of education: Not on file   Highest education level: Not on file  Occupational History   Not on file  Tobacco Use   Smoking status: Never   Smokeless tobacco: Former    Types: Snuff   Tobacco comments:    Is in process of decreasing amount  Vaping Use   Vaping Use: Never used  Substance and Sexual Activity   Alcohol use: No    Alcohol/week: 0.0 standard drinks of alcohol   Drug use: No   Sexual activity: Not Currently    Birth control/protection: Surgical    Comment: Hysterectomy  Other Topics Concern   Not on file  Social History Narrative   Married    No guns    Wears seat belt    Safe in relationship    Never smoker    2 sons Bryan ~10 and another son ~28 as of 10/2019   1 granson eli 1 y.o as of 10/2019   2 sisters    Unemployed       Social Determinants of Health   Financial Resource Strain: Low Risk  (06/19/2022)   Overall Financial Resource Strain (CARDIA)    Difficulty of Paying Living Expenses: Not hard at all  Food Insecurity: No Food Insecurity (06/19/2022)   Hunger Vital Sign    Worried About Running Out of Food in the Last Year: Never true    Ran Out of Food in the Last Year: Never true  Transportation Needs: No Transportation Needs (06/19/2022)   PRAPARE - Transportation    Lack of Transportation (Medical): No    Lack of Transportation (Non-Medical): No  Physical Activity: Insufficiently Active (07/07/2019)   Exercise Vital Sign    Days of Exercise per Week: 3 days    Minutes of Exercise per Session: 30 min  Stress: No Stress Concern Present (06/19/2022)   [employer] - Occupational Stress Questionnaire    Feeling of Stress : Not at all  Social Connections: Unknown (06/19/2022)   Social Connection and Isolation Panel [NHANES]    Frequency of Communication with Friends and Family: More than three times a week    Frequency of Social Gatherings with Friends and  Family: More than three times a week    Attends Religious Services: Not on file    Active Member of Clubs or Organizations: Not on file    Attends Club or Organization Meetings: Not on file    Marital Status: Married  Intimate   Partner Violence: Not At Risk (06/19/2022)   Humiliation, Afraid, Rape, and Kick questionnaire    Fear of Current or Ex-Partner: No    Emotionally Abused: No    Physically Abused: No    Sexually Abused: No   Current Meds  Medication Sig   ACCU-CHEK GUIDE test strip Use as instructed   albuterol (VENTOLIN HFA) 108 (90 Base) MCG/ACT inhaler Inhale 1-2 puffs into the lungs every 6 (six) hours as needed for wheezing or shortness of breath.   amLODipine (NORVASC) 5 MG tablet Take 1 tablet (5 mg total) by mouth daily.   benztropine (COGENTIN) 1 MG tablet Take 1 mg by mouth 2 (two) times daily.   Blood Glucose Monitoring Suppl (ACCU-CHEK GUIDE ME) w/Device KIT Use as directed,once daily to test blood sugar   buPROPion (WELLBUTRIN XL) 150 MG 24 hr tablet Take 150 mg by mouth every morning.   celecoxib (CELEBREX) 200 MG capsule Take 1 capsule by mouth 2 (two) times daily as needed (pain).   cetirizine (ZYRTEC ALLERGY) 10 MG tablet Take 2 tablets (20 mg total) by mouth daily.   EASY COMFORT PEN NEEDLES 31G X 8 MM MISC INJECT VICTOZA ONCE DAILY AS DIRECTED   EPINEPHrine 0.3 mg/0.3 mL IJ SOAJ injection Inject 0.3 mg into the muscle as needed for anaphylaxis.   famotidine (PEPCID) 40 MG tablet Take 1 tablet (40 mg total) by mouth daily.   fexofenadine (ALLEGRA) 180 MG tablet Take 2 tablets (360 mg total) by mouth every morning. Prn only if needed either take zyrtec or allegra explain to patient   FLUoxetine (PROZAC) 40 MG capsule Take 40 mg by mouth daily.   fluPHENAZine (PROLIXIN) 2.5 MG tablet Take 2.5 mg by mouth 2 (two) times daily.   fluticasone (FLONASE) 50 MCG/ACT nasal spray Place 2 sprays into both nostrils daily.   furosemide (LASIX) 20 MG tablet Take 0.5-1 tablets  (10-20 mg total) by mouth daily as needed. (Patient taking differently: Take 20 mg by mouth daily as needed.)   hydrocortisone 2.5 % ointment Apply topically 2 (two) times daily. Neck   hydrOXYzine (ATARAX) 50 MG tablet Take 50 mg by mouth 4 (four) times daily as needed for anxiety.   Insulin Pen Needle (NOVOFINE) 30G X 8 MM MISC Use with victoza daily 1.8 mg   liraglutide (VICTOZA) 18 MG/3ML SOPN Inject 1.8 mg into the skin daily.   metoprolol succinate (TOPROL-XL) 25 MG 24 hr tablet Take 1 tablet (25 mg total) by mouth daily.   montelukast (SINGULAIR) 10 MG tablet Take 1 tablet (10 mg total) by mouth daily.   Multiple Vitamins-Minerals (ALIVE MULTI-VITAMIN PO) Take by mouth daily in the afternoon.   mupirocin ointment (BACTROBAN) 2 % Apply 1 Application topically 2 (two) times daily. Left leg wound   nicotine (NICODERM CQ - DOSED IN MG/24 HOURS) 14 mg/24hr patch Place 1 patch (14 mg total) onto the skin daily. X 45 days then go down to 7 mg patch   nicotine (NICODERM CQ - DOSED IN MG/24 HOURS) 14 mg/24hr patch Place 1 patch (14 mg total) onto the skin daily.   nicotine (NICODERM CQ - DOSED IN MG/24 HOURS) 21 mg/24hr patch Place 1 patch (21 mg total) onto the skin daily.   nicotine (NICODERM CQ - DOSED IN MG/24 HR) 7 mg/24hr patch Place 1 patch (7 mg total) onto the skin daily. After using 14 mg patch   nicotine (NICODERM CQ - DOSED IN MG/24 HR) 7 mg/24hr patch Place 1 patch (7 mg  total) onto the skin daily.   omeprazole (PRILOSEC) 40 MG capsule Take 1 capsule (40 mg total) by mouth daily.   ondansetron (ZOFRAN-ODT) 4 MG disintegrating tablet Take 1 tablet (4 mg total) by mouth every 8 (eight) hours as needed for nausea or vomiting.   oxyCODONE-acetaminophen (PERCOCET) 10-325 MG tablet Take 1 tablet by mouth 3 (three) times daily as needed for pain.   QUEtiapine (SEROQUEL XR) 400 MG 24 hr tablet Take 400 mg by mouth at bedtime.   scopolamine (TRANSDERM-SCOP) 1 MG/3DAYS Place 1 patch (1.5 mg total)  onto the skin every 3 (three) days. 4 hours before you get on boat   topiramate (TOPAMAX) 100 MG tablet Take 100 mg by mouth at bedtime.   valACYclovir (VALTREX) 1000 MG tablet Take 1 tablet (1,000 mg total) by mouth 2 (two) times daily. for 7 days prn   vitamin B-12 (CYANOCOBALAMIN) 1000 MCG tablet Take 1,000 mcg by mouth daily.   Allergies  Allergen Reactions   Cozaar [Losartan] Hives and Swelling    Angioedema   Keflex [Cephalexin] Swelling and Rash   Penicillins Rash    Has patient had a PCN reaction causing immediate rash, facial/tongue/throat swelling, SOB or lightheadedness with hypotension: Yes Has patient had a PCN reaction causing severe rash involving mucus membranes or skin necrosis: No Has patient had a PCN reaction that required hospitalization: No Has patient had a PCN reaction occurring within the last 10 years: No If all of the above answers are "NO", then may proceed with Cephalosporin use.    Zestril [Lisinopril] Hives and Other (See Comments)    Headache Low BP Angioedema     Hctz [Hydrochlorothiazide] Swelling and Rash   Bee Venom Swelling   Metformin And Related Rash   Recent Results (from the past 2160 hour(s))  Basic metabolic panel per protocol     Status: None   Collection Time: 04/24/22 12:20 PM  Result Value Ref Range   Sodium 141 135 - 145 mmol/L   Potassium 4.0 3.5 - 5.1 mmol/L   Chloride 109 98 - 111 mmol/L   CO2 24 22 - 32 mmol/L   Glucose, Bld 95 70 - 99 mg/dL    Comment: Glucose reference range applies only to samples taken after fasting for at least 8 hours.   BUN 8 6 - 20 mg/dL   Creatinine, Ser 0.96 0.44 - 1.00 mg/dL   Calcium 9.2 8.9 - 10.3 mg/dL   GFR, Estimated >60 >60 mL/min    Comment: (NOTE) Calculated using the CKD-EPI Creatinine Equation (2021)    Anion gap 8 5 - 15    Comment: Performed at [facility], [address] [place], [place] [address]  CBC per protocol     Status: Abnormal   Collection Time: 04/24/22 12:20 PM   Result Value Ref Range   WBC 5.2 4.0 - 10.5 K/uL   RBC 3.82 (L) 3.87 - 5.11 MIL/uL   Hemoglobin 11.4 (L) 12.0 - 15.0 g/dL   HCT 31.5 (L) 36.0 - 46.0 %   MCV 82.5 80.0 - 100.0 fL   MCH 29.8 26.0 - 34.0 pg   MCHC 36.2 (H) 30.0 - 36.0 g/dL   RDW 14.7 11.5 - 15.5 %   Platelets 204 150 - 400 K/uL   nRBC 0.0 0.0 - 0.2 %    Comment: Performed at [facility], [address] [place], [place] [address]  Surgical pathology     Status: None   Collection Time:   04/24/22  3:01 PM  Result Value Ref Range   SURGICAL PATHOLOGY      SURGICAL PATHOLOGY CASE: [id number] [phone] PATIENT: Aleila Papadakis Surgical Pathology Report     Clinical History: panniculitis (cm)     FINAL MICROSCOPIC DIAGNOSIS:  A. PANNICULUS, PANNICULECTOMY: -  Unremarkable skin and fragments of adipose tissue with no significant pathology.   GROSS DESCRIPTION:  Received fresh is a 1533 g, 55 x 12 cm elliptical tan-brown unremarkable skin with underlying soft lobulated fatty tissue up to 6 cm in depth. Also included with the specimen are several separate soft lobulated fatty tissues, 7.6 x 5.6 x 2.2 cm in aggregate.  Sections through the skin with underlying fatty tissue and separate fatty tissues are unremarkable.  Representative sections are submitted 1 block.  SW 04/25/2022   Final Diagnosis performed by Tilford Pillar DO.   Electronically signed 04/28/2022 Technical component performed at [employer]. [facility], [place] [address], [place], [place] [address].  Professional component performed at [facility], [place] [address] [place], [place] [address].  Immunohistochemistry Technical component (if applicable) was performed at [facility]. [address] [place], [place], [place] [address].   IMMUNOHISTOCHEMISTRY DISCLAIMER (if applicable): Some of these immunohistochemical stains may have been developed and the performance characteristics determine by  [facility]. Some may not have been cleared or approved by the U.S. Food and Drug Administration. The FDA has determined that such clearance or approval is not necessary. This test is used for clinical purposes. It should not be regarded as investigational or for research. This laboratory is certified under the [place] (CLIA-88) as qualified to perform high complexity clinical laboratory testing.  The controls stained appropriately.   CBC with Differential     Status: Abnormal   Collection Time: 05/17/22 12:47 PM  Result Value Ref Range   WBC 7.9 4.0 - 10.5 K/uL   RBC 3.27 (L) 3.87 - 5.11 MIL/uL   Hemoglobin 9.3 (L) 12.0 - 15.0 g/dL   HCT 26.6 (L) 36.0 - 46.0 %   MCV 81.3 80.0 - 100.0 fL   MCH 28.4 26.0 - 34.0 pg   MCHC 35.0 30.0 - 36.0 g/dL   RDW 13.4 11.5 - 15.5 %   Platelets 288 150 - 400 K/uL   nRBC 0.0 0.0 - 0.2 %   Neutrophils Relative % 67 %   Neutro Abs 5.3 1.7 - 7.7 K/uL   Lymphocytes Relative 21 %   Lymphs Abs 1.6 0.7 - 4.0 K/uL   Monocytes Relative 8 %   Monocytes Absolute 0.6 0.1 - 1.0 K/uL   Eosinophils Relative 3 %   Eosinophils Absolute 0.2 0.0 - 0.5 K/uL   Basophils Relative 1 %   Basophils Absolute 0.1 0.0 - 0.1 K/uL   Immature Granulocytes 0 %   Abs Immature Granulocytes 0.02 0.00 - 0.07 K/uL    Comment: Performed at [facility], [address] [place] [address] [place],  [address]  Comprehensive metabolic panel     Status: Abnormal   Collection Time: 05/17/22 12:47 PM  Result Value Ref Range   Sodium 139 135 - 145 mmol/L   Potassium 3.5 3.5 - 5.1 mmol/L   Chloride 108 98 - 111 mmol/L   CO2 25 22 - 32 mmol/L   Glucose, Bld 89 70 - 99 mg/dL    Comment: Glucose reference range applies only to samples taken after fasting for at least 8 hours.   BUN 11  6 - 20 mg/dL   Creatinine, Ser 1.06 (H) 0.44 - 1.00 mg/dL   Calcium 8.9 8.9 - 10.3 mg/dL   Total Protein 7.3 6.5 - 8.1 g/dL   Albumin 3.4 (L) 3.5 -  5.0 g/dL   AST 16 15 - 41 U/L   ALT 13 0 - 44 U/L   Alkaline Phosphatase 65 38 - 126 U/L   Total Bilirubin 0.4 0.3 - 1.2 mg/dL   GFR, Estimated >60 >60 mL/min    Comment: (NOTE) Calculated using the CKD-EPI Creatinine Equation (2021)    Anion gap 6 5 - 15    Comment: Performed at [facility], [address] [place], [place] [address]  POCT glycosylated hemoglobin (Hb A1C)     Status: None   Collection Time: 07/10/22  3:34 PM  Result Value Ref Range   Hemoglobin A1C 5.2 4.0 - 5.6 %   HbA1c POC (<> result, manual entry)     HbA1c, POC (prediabetic range)     HbA1c, POC (controlled diabetic range)     Objective  Body mass index is 40.21 kg/m. Wt Readings from Last 3 Encounters:  07/10/22 227 lb (103 kg)  06/25/22 218 lb 3.2 oz (99 kg)  06/19/22 222 lb 6.4 oz (100.9 kg)   Temp Readings from Last 3 Encounters:  07/10/22 98.4 F (36.9 C) (Oral)  06/25/22 97.8 F (36.6 C) (Oral)  06/19/22 98.3 F (36.8 C)   BP Readings from Last 3 Encounters:  07/10/22 130/68  06/25/22 136/80  06/19/22 128/82   Pulse Readings from Last 3 Encounters:  07/10/22 96  06/25/22 83  06/19/22 97    Physical Exam Vitals and nursing note reviewed.  Constitutional:      Appearance: Normal appearance. She is well-developed and well-groomed.  HENT:     Head: Normocephalic and atraumatic.  Eyes:     Conjunctiva/sclera: Conjunctivae normal.     Pupils: Pupils are equal, round, and reactive to light.  Cardiovascular:     Rate and Rhythm: Normal rate and regular rhythm.     Heart sounds: Normal heart sounds. No murmur heard. Pulmonary:     Effort: Pulmonary effort is normal.     Breath sounds: Normal breath sounds.  Abdominal:     General: Abdomen is flat. Bowel sounds are normal.     Tenderness: There is no abdominal tenderness.  Musculoskeletal:        General: No tenderness.  Skin:    General: Skin is warm and dry.  Neurological:     General: No focal deficit present.      Mental Status: She is alert and oriented to person, place, and time. Mental status is at baseline.     Cranial Nerves: Cranial nerves 2-12 are intact.     Motor: Motor function is intact.     Coordination: Coordination is intact.     Gait: Gait is intact.  Psychiatric:        Attention and Perception: Attention and perception normal.        Mood and Affect: Mood and affect normal.        Speech: Speech normal.        Behavior: Behavior normal. Behavior is cooperative.        Thought Content: Thought content normal.        Cognition and Memory: Cognition and memory normal.        Judgment: Judgment normal.     Assessment  Plan  Annual physical exam See below  Arthritis of right knee - Plan: Ambulatory referral to Orthopedic Surgery Baker's cyst of knee, right - Plan: Ambulatory referral to Orthopedic Surgery Knee effusion, right - Plan: Ambulatory referral to Orthopedic Surgery  Hyperglycemia - Plan: POCT glycosylated hemoglobin (Hb A1C) no prediabetes A1c 5.2  Cataract of both eyes, unspecified cataract type - Plan: Ambulatory referral to Ophthalmology Vision changes - Plan: Ambulatory referral to Ophthalmology  Nonintractable headache, unspecified chronicity pattern, unspecified headache type - Plan: Ambulatory referral to Ophthalmology Needs CPAP sent ENT message Dr. Juengel    Abrasion of anterior left lower leg, initial encounter - Plan: mupirocin ointment (BACTROBAN) 2 %  Chewing tobacco nicotine dependence in remission - Plan: nicotine (NICODERM CQ - DOSED IN MG/24 HOURS) 21 mg/24hr patch, nicotine (NICODERM CQ - DOSED IN MG/24 HOURS) 14 mg/24hr patch, nicotine (NICODERM CQ - DOSED IN MG/24 HR) 7 mg/24hr patch    B/l CTS s/p surgery right hand in 1996  -->will refer to [facility] hand ortho  10/29/16 EMG/NCS  Impression: Abnormal study. There is electrodiagnostic evidence of chronic, severe right and moderate left carpal tunnel syndrome.    Recommendation: Recommend referral to orthopedics for carpal tunnel release consultation.   Thank you for the referral of this patient. It was our privilege to participate in care of your patient. Feel free to contact us with any further questions.  _____________________________ Zachary Potter, M.D. (Neurology) [facility] Flu shot utd Tdap 11/24/21 Pfizer 2/2 consider booster rec  Prevnar 13 09/2021, prevnar 20 due in 5 years  Pna 23 07/12/16 2/2 shingrix  Not immune MMR, hep B titer consider in the future  915/21 house calls visit A1c 6.1    h/o supracervical hysterectomy ([facility]), pap 02/29/16 negative no comment HPV, another pap 06/23/17 neg pap neg HPV -h/o LEEP 2006 11/29/19 negative pap    mammo 08/04/19 negative ordered 08/22/20 negative ordered 2022  08/05/21 negative ordered 07/2022 call to schedule   Colonoscopy 04/15/19 normal f/u in 10 years [facility] sch 10/23/20 Dr. Anna neg bxs 10/23/20 aperistalsis and food + consider gastric emptying study and esophageal mamometry  10/25/20 Dr. Kiran  Dr Scocuzza,    Echo Dr. Kowalski -nl LV function EF 60% moderate mitral insuff and mild tricuspid insufficiency treadmill stress test neg 07/30/13  -h/o PVCS   Hep C neg 08/17/20 HIV neg 02/04/16 Rec healthy diet and exercise    Ob/gyn appt pending bladder leakage I.e bladder sling vs other [facility] in PT (stopped PT w/o help worsen sneezing or coughing)  -Dr. Staebler pt wants surgery as of 04/20/20 sx's better and no surgery indicated    Consider referral repeat sleep study h/o OSA f/u Dr. Juengel and will get procedure for OSA as of 04/17/21  As of 07/10/22 pt needs CPAP machine so sent Dr Juengel a message stating this    Provider: Dr.  McLean-Scocuzza-Internal Medicine  

## 2022-07-10 NOTE — Patient Instructions (Addendum)
Dr. Alvan Dame or Dr. Shary Decamp [facility]   Phone Fax E-mail Address  [phone] Not available [address]   [address]   [place] [address]     Specialties     Orthopedic Surgery            [facility]   Phone Fax E-mail Address  [phone] Not available [address]    [place] [address]     Specialties     Ophthalmology             Additional Contact Information  Phone Fax E-mail Address  [phone] Not available [address]   [place] [address]       ENT  Dr. Kathyrn Sheriff  MD Physician   Primary Contact Information  Phone Fax E-mail Address  [phone] Not available [address]   [address]   [place] [address]     Specialties     Otolaryngology

## 2022-07-10 NOTE — Telephone Encounter (Signed)
Has been faxed and completed

## 2022-07-10 NOTE — Unmapped (Addendum)
Treatment Plan:  You need to call your insurance company right after you do your Xolair injections so they can start the approval process for the next injection.  Stop taking Allegra (fexofenadine).  Continue on Zyrtec (cetirizine) 2 pills in the morning and 2 pills in the evening.

## 2022-07-10 NOTE — Unmapped (Signed)
[employer] [facility] Specialty Medication Onboarding    Specialty Medication: Xolair  Prior Authorization: Approved   Financial Assistance: No - copay  <$25  Final Copay/Day Supply: $0 / 21    Insurance Restrictions: None     Notes to Pharmacist: None    The triage team has completed the benefits investigation and has determined that the patient is able to fill this medication at [facility]. Please contact the patient to complete the onboarding or follow up with the prescribing physician as needed.

## 2022-07-11 ENCOUNTER — Telehealth: Payer: Self-pay

## 2022-07-11 ENCOUNTER — Telehealth: Payer: Self-pay | Admitting: Internal Medicine

## 2022-07-11 MED ORDER — EMPTY CONTAINER
3 refills | 0 days
Start: 2022-07-11 — End: ?

## 2022-07-11 NOTE — Telephone Encounter (Signed)
Pt called stating she forgot to mention both of her hands are numb at her appointment and she would like the doctor information that she was referred to couple years ago. Pt would like to be called

## 2022-07-11 NOTE — Telephone Encounter (Signed)
Patient states she is having numbness in both of her hands and she states her hands are cold.  Patient states sometimes her hands cramp up and she has to pull it out.  *I transferred call to Access Nurse.

## 2022-07-11 NOTE — Unmapped (Signed)
[facility]   Patient Onboarding/Medication Counseling    Ms. Locklin states she is past due for her Xolair injection. She denies experiencing any hives at this time and agreed to take dose as soon as she receives it on Tuesday (10/10).    Ms.Owusu is a 56 y.o. female with chronic idiopathic urticaria who I am counseling today on continuation of therapy.  I am speaking to the patient.    Was a Nurse, learning disability used for this call? No    Verified patient's date of birth / HIPAA.    Specialty medication(s) to be sent: Inflammatory Disorders: Xolair      Non-specialty medications/supplies to be sent: sharps container      Medications not needed at this time: n/a         The patient declined counseling on medication administration, missed dose instructions, goals of therapy, side effects and monitoring parameters, warnings and precautions, drug/food interactions, and storage, handling precautions, and disposal because they have taken the medication previously. The information in the declined sections below are for informational purposes only and was not discussed with patient.       Xolair Syringes (omalizumab)    Medication & Administration     Dosage: Inject 300 mg under the skin every 21 days    Administration:     Prefilled Syringe:   Home administration screening questions:     Has patient ever had an anaphylaxis reaction to Xolair or other agents, such as foods, drugs, biologics, etc? No     Has patient received at least 3 doses in clinic under the supervision of a healthcare professional? Yes     Does the patient have an Epi-pen to use for possible anaphylaxis reaction? Yes     Injection administration:   Gather all supplies needed for injection on a clean, flat working surface: medication syringe(s) removed from packaging, alcohol swab, sharps container, etc.  Look at the medication label - look for correct medication, correct dose, and check the expiration date  Look at the medication - the liquid in the syringe should appear clear and colorless to slightly yellow, you may see a few white particles  Lay the syringe on a flat surface and allow it to warm up to room temperature for at least 15-30 minutes  Select injection site - you can use the front of your thigh or your belly (but not the area 2 inches around your belly button); if someone else is giving you the injection you can also use your upper arm in the skin covering your triceps muscle or in the buttocks  Prepare injection site - wash your hands and clean the skin at the injection site with an alcohol swab and let it air dry, do not touch the injection site again before the injection  Pull off the needle safety cap, do not remove until immediately prior to injection  Pinch the skin - with your hand not holding the syringe pinch up a fold of skin at the injection site using your forefinger and thumb  Insert the needle into the fold of skin at about a 45 degree angle - it's best to use a quick dart-like motion  Push the plunger down slowly as far as it will go until the syringe is empty, if the plunger is not fully depressed the needle shield will not extend to cover the needle when it is removed, hold the syringe in place for a full 5 seconds  Check that the  syringe is empty and keep pressing down on the plunger while you pull the needle out at the same angle as inserted; after the needle is removed completely from the skin, release the plunger allowing the needle shield to activate and cover the used needle  Dispose of the used syringe immediately in your sharps disposal container, do not attempt to recap the needle prior to disposing  If you see any blood at the injection site, press a cotton ball or gauze on the site and maintain pressure until the bleeding stops, do not rub the injection site    Adherence/Missed dose instructions: If you miss a dose take as soon as you remember.  Resume the correct dosing schedule.        Goals of Therapy To treat asthma and or chronic idiopathic urticaria    Side Effects & Monitoring Parameters     Commonly reported side effects  Headache  Nausea, vomiting   Injection site reaction  Loss of strength and energy  Common cold symptoms, sore throat, stuffy nose  Ear pain  Painful extremities     The following side effects should be reported to the provider:  Signs of cerebrovascular disease (change in strength on one side is greater than the other, trouble speaking or thinking, change in balance or vision changes)  Signs of DVT (swelling, warmth, numbness, change in color or pain in extremities)  Signs of anaphylaxis (wheezing, chest tightness, swelling of face, lips, tongue or throat)    Monitoring Parameters:   Anaphylactic/hypersensitivity reactions (observe patients for 2 hours after the first 3 injections and 30 minutes after subsequent injections or in accordance with individual institution policies and procedures);   Baseline serum total IgE; FEV1, peak flow, and/or other pulmonary function tests  Monitor for signs of infection    Contraindications, Warnings, & Precautions   [place] Boxed Warning]: Anaphylaxis, including delayed-onset anaphylaxis, has been reported following administration; anaphylaxis may present as bronchospasm, hypotension, syncope, urticaria, and/or angioedema of the throat or tongue. Anaphylaxis has occurred after the first dose and in some cases >1 year after initiation of regular treatment. Due to the risk, patients should be observed closely for an appropriate time period after administration and should receive treatment only under direct medical supervision. Healthcare providers should be prepared to administer appropriate therapy for managing potentially life-threatening anaphylaxis. Patients should be instructed on identifying signs/symptoms of anaphylaxis and to seek immediate care if they arise.      Contraindications  Severe hypersensitivity reaction to omalizumab or any component of the formulation    Warnings & Precautions  Cardiovascular effects: Cerebrovascular events, including transient ischemic attack and ischemic stroke, have been reported.  Eosinophilia and vasculitis: In rare cases, patients may present with systemic eosinophilia, sometimes presenting with clinical features of vasculitis.    Fever/arthralgia/rash: Reports of a constellation of symptoms including fever, arthritis or arthralgia, rash, and lymphadenopathy have been reported with post-marketing use.  Malignant neoplasms: Have been reported rarely with use in short-term studies; impact of long-term use is not known.  Parasitic infections: Use with caution and monitor patients at high risk for parasitic infections; risk of infection may be increased; appropriate duration of continued monitoring following therapy discontinuation has not been established.  Corticosteroid therapy: Gradually taper systemic or inhaled corticosteroid therapy; do not discontinue corticosteroids abruptly following initiation of omalizumab therapy. The combined use of omalizumab and corticosteroids in patients with chronic idiopathic urticaria has not been evaluated.  Latex: Prefilled syringe: The needle cap may contain natural  rubber latex.  Appropriate use: Therapy has not been shown to alleviate acute asthma exacerbations; do not use to treat acute bronchospasm, status asthmaticus, or other allergic conditions. Do not use to treat forms of urticaria other than chronic idiopathic urticaria.  Dosing/IgE levels: Dosing for asthma is based on body weight and pretreatment total IgE serum levels. IgE levels remain elevated up to 1 year following treatment; therefore, levels taken during treatment or for up to 1 year following treatment cannot and should not be used as a dosage guide. Dosing in chronic idiopathic urticaria is not dependent on serum IgE (free or total) level or body weight.  Pregnancy Considerations: Omalizumab is a humanized monoclonal antibody (IgG1). Potential placental transfer of human IgG is dependent upon the IgG subclass and gestational age, generally increasing as pregnancy progresses.  Breastfeeding Considerations: It is not known if omalizumab is present in breast milk; however, IgG is excreted in human milk.  Based on information from the pregnancy exposure registry, an increased risk of adverse events was not observed in breastfed infants of mothers using omalizumab. According to the manufacturer, the decision to breastfeed during therapy should consider the risk of infant exposure, the benefits of breastfeeding to the infant, and benefits of treatment to the mother      Drug/Food Interactions     Medication list reviewed in Epic. The patient was instructed to inform the care team before taking any new medications or supplements. No drug interactions identified.     Storage, Handling Precautions, & Disposal     Store this medication in the refrigerator, 2??C to 8??C (36??F to 46??F). in the original carton.  Protect from direct sunlight and do not freeze. Must be used within 4 hours after removal from refrigerator.  Do not shake.  Dispose of used syringes in a sharps disposal container.      Current Medications (including OTC/herbals), Comorbidities and Allergies     Current Outpatient Medications   Medication Sig Dispense Refill    ALCOHOL PADS PadM Use as directed,once daily to test blood sugar      amLODIPine (NORVASC) 5 MG tablet       benztropine (COGENTIN) 1 MG tablet Take 1 tablet (1 mg total) by mouth daily.      cetirizine (ZYRTEC) 10 MG tablet TAKE 2 TABLETS BY MOUTH ONCE DAILY AT NIGHT 60 tablet 0    clobetasoL (TEMOVATE) 0.05 % cream Apply 1-2 times a day to the raised itchy spots on your body as needed . 30 g 0    clotrimazole (LOTRIMIN) 1 % cream APPLY TOPICALLY IN THE MORNING AND AT BEDTIME      clotrimazole-betamethasone (LOTRISONE) 1-0.05 % cream APPLY EXTERNALLY TWICE DAILY AS NEEDED FOR SYMPTOMS FOR UP TO 2 WEEKS clotrimazole-betamethasone (LOTRISONE) 1-0.05 % cream Apply externally BID prn sx up to 2 wks      EPINEPHrine (EPIPEN) 0.3 mg/0.3 mL injection Inject 0.3 mL (0.3 mg total) into the muscle once for 1 dose. May repeat in 5 minutes if symptoms are not improved or worsening. 1 each PRN    famotidine (PEPCID) 20 MG tablet 1 tablet (20 mg total) two (2) times a day.      ferrous sulfate 325 (65 FE) MG tablet Take 1 tablet (325 mg total) by mouth in the morning.      FLUoxetine (PROZAC) 10 MG capsule TAKE 1 CAPSULE BY MOUTH IN THE MORNING      fluPHENAZine (PROLIXIN) 2.5 MG tablet Take 1 tablet (2.5 mg total) by  mouth daily.      fluticasone propionate (FLONASE) 50 mcg/actuation nasal spray Place 2 sprays into both nostrils daily.      hydrocortisone 2.5 % lotion Apply 1-2 times a day to the raised itchy spots as needed on your face 59 mL 0    hydrOXYchloroQUINE (PLAQUENIL) 200 mg tablet Take 1 tablet (200 mg total) by mouth Two (2) times a day. 60 tablet 12    hydrOXYzine (ATARAX) 25 MG tablet Take 1 tablet (25 mg total) by mouth two (2) times a day as needed for itching (hives). 60 tablet 3    melatonin 5 mg Tab Take 1 tablet (5 mg total) by mouth nightly.      metoprolol succinate (TOPROL-XL) 25 MG 24 hr tablet       montelukast (SINGULAIR) 10 mg tablet Take 1 tablet (10 mg total) by mouth nightly. 90 tablet 3    mupirocin (BACTROBAN) 2 % ointment       omalizumab (XOLAIR) 150 mg/mL syringe Inject the contents of 2 syringes (300 mg total) under the skin every twenty-one (21) days. 2 mL 12    ONETOUCH ULTRA TEST Strp Use as directed,once daily to test blood sugar      oxyCODONE-acetaminophen (PERCOCET) 10-325 mg per tablet TAKE 1 TABLET BY MOUTH THREE TIMES DAILY AS NEEDED FOR CHRONIC PAIN      topiramate (TOPAMAX) 100 MG tablet TAKE 1 & 1/2 (ONE & ONE-HALF) TABLETS BY MOUTH IN THE EVENING      triamcinolone (KENALOG) 0.1 % cream Apply topically to affected areas twice daily as needed for rash. 454 g 2    triamcinolone (KENALOG) 0.1 % ointment Apply twice a day as needed only, only to areas with active hives breakouts. 80 g 6    VICTOZA 3-PAK 0.6 mg/0.1 mL (18 mg/3 mL) injection Inject 1.8 mg into the skin daily.       No current facility-administered medications for this visit.       Allergies   Allergen Reactions    Cephalexin Rash    Hydrochlorothiazide Swelling     Mild she says    Losartan Hives and Swelling     Angioedema  Angioedema      Penicillins Rash    Metformin      Rash  Other reaction(s): Other (See Comments)  Rash    Venom-Honey Bee      Other reaction(s): Other (See Comments)  Swelling  Swelling      Lisinopril Other (See Comments)     Low BP       Patient Active Problem List   Diagnosis    Urticaria    Angio-edema    Chronic idiopathic urticaria       Reviewed and up to date in Epic.    Appropriateness of Therapy     Acute infections noted within Epic:  No active infections  Patient reported infection: None    Is medication and dose appropriate based on diagnosis and infection status? Yes    Prescription has been clinically reviewed: Yes      Baseline Quality of Life Assessment      How many days over the past month did your idiopathic urticaria  keep you from your normal activities? For example, brushing your teeth or getting up in the morning. 0    Financial Information     Medication Assistance provided: Prior Authorization    Anticipated copay of $0 / 21 days reviewed with patient. Verified delivery address.  Delivery Information     Scheduled delivery date: 07/15/22    Expected start date: 07/15/22    Medication will be delivered via UPS to the prescription address in [facility].  This shipment will not require a signature.      Explained the services we provide at [facility] and that each month we would call to set up refills.  Stressed importance of returning phone calls so that we could ensure they receive their medications in time each month.  Informed patient that we should be setting up refills 7-10 days prior to when they will run out of medication.  A pharmacist will reach out to perform a clinical assessment periodically.  Informed patient that a welcome packet, containing information about our pharmacy and other support services, a Notice of Privacy Practices, and a drug information handout will be sent.      The patient or caregiver noted above participated in the development of this care plan and knows that they can request review of or adjustments to the care plan at any time.      Patient or caregiver verbalized understanding of the above information as well as how to contact the pharmacy at [phone] option 4 with any questions/concerns.  The pharmacy is open Monday through Friday 8:30am-4:30pm.  A pharmacist is available 24/7 via pager to answer any clinical questions they may have.    Patient Specific Needs     Does the patient have any physical, cognitive, or cultural barriers? No    Does the patient have adequate living arrangements? (i.e. the ability to store and take their medication appropriately) Yes    Did you identify any home environmental safety or security hazards? No    Patient prefers to have medications discussed with  Patient     Is the patient or caregiver able to read and understand education materials at a high school level or above? Yes    Patient's primary language is  English     Is the patient high risk? No    SOCIAL DETERMINANTS OF HEALTH     At the [facility], we have learned that life circumstances - like trouble affording food, housing, utilities, or transportation can affect the health of many of our patients.   That is why we wanted to ask: are you currently experiencing any life circumstances that are negatively impacting your health and/or quality of life? Patient declined to answer    Social Determinants of Health     Financial Resource Strain: Not on file   Internet Connectivity: Not on file   Food Insecurity: Not on file   Tobacco Use: Medium Risk (07/10/2022)    Patient History     Smoking Tobacco Use: Never     Smokeless Tobacco Use: Former     Passive Exposure: Not on file   Housing/Utilities: Not on file   Alcohol Use: Not on file   Transportation Needs: Not on file   Substance Use: Not on file   Health Literacy: Not on file   Physical Activity: Not on file   Interpersonal Safety: Not on file   Stress: Not on file   Intimate Partner Violence: Not on file   Depression: Not on file   Social Connections: Not on file       Would you be willing to receive help with any of the needs that you have identified today? Not applicable       [name], PharmD  [facility] Specialty Pharmacist

## 2022-07-14 ENCOUNTER — Telehealth: Payer: Self-pay

## 2022-07-14 MED FILL — EMPTY CONTAINER: 90 days supply | Qty: 1 | Fill #0

## 2022-07-14 NOTE — Telephone Encounter (Signed)
Pt has been informed.

## 2022-07-14 NOTE — Telephone Encounter (Signed)
She had nerve conduction study + carpal tunnel syndrome (CTS) in 2018 neurology Dr. Melrose Nakayama but will need to f/u with ortho  2nd referred to [facility] in [place]  1st for right knee  2nd for bilateral CTS for hand ortho in [place]

## 2022-07-14 NOTE — Telephone Encounter (Signed)
Will refer to ortho in [place]

## 2022-07-14 NOTE — Addendum Note (Signed)
Addended by: Orland Mustard on: 07/14/2022 08:57 PM   Modules accepted: Orders

## 2022-07-15 ENCOUNTER — Ambulatory Visit: Payer: Medicare Other | Admitting: Internal Medicine

## 2022-07-15 ENCOUNTER — Encounter: Payer: Self-pay | Admitting: Internal Medicine

## 2022-07-15 VITALS — BP 138/64 | HR 91 | Temp 98.8°F | Ht 63.0 in | Wt 224.4 lb

## 2022-07-15 DIAGNOSIS — G5603 Carpal tunnel syndrome, bilateral upper limbs: Secondary | ICD-10-CM

## 2022-07-15 DIAGNOSIS — Z23 Encounter for immunization: Secondary | ICD-10-CM

## 2022-07-15 DIAGNOSIS — M1711 Unilateral primary osteoarthritis, right knee: Secondary | ICD-10-CM

## 2022-07-15 NOTE — Progress Notes (Signed)
Chief Complaint  Patient presents with   Numbness    Both hands have been numb pt was sent to access nurse and told to be seen by PCP   F/u  1. H/o CTS right hand severe s/p release and left hand moderate emg/ncs 10/29/16 [facility] [place] she has numbness burning in fingers thumb to ring finger bl Dr. Apolonio Schneiders 08/01/22 10 am  2. Chronic right knee pain with swelling s/p steroid shots and abnormal MRI right knee  Pt has appt Dr. Lyla Glassing 08/04/22 9:30 am to discuss surgery   IMPRESSION: 1. Intact ligamentous structures and no acute bony findings. Mild mucoid degeneration of the ACL. 2. No meniscal tears. 3. Moderate age advanced tricompartmental degenerative changes. 4. Small to moderate-sized joint effusion and very small Baker's cyst.     Electronically Signed   By: Marijo Sanes M.D.   On: 03/02/2020 12:03     Review of Systems  Constitutional:  Negative for weight loss.  HENT:  Negative for hearing loss.   Eyes:  Negative for blurred vision.  Respiratory:  Negative for shortness of breath.   Cardiovascular:  Negative for chest pain.  Gastrointestinal:  Negative for abdominal pain, blood in stool and constipation.  Genitourinary:  Negative for dysuria.  Musculoskeletal:  Negative for falls and joint pain.  Skin:  Negative for rash.  Neurological:  Positive for sensory change. Negative for headaches.  Psychiatric/Behavioral:  Negative for depression.    Past Medical History:  Diagnosis Date   Allergic rhinitis    Anxiety    Arthritis of knee, right    Asthma    Bipolar disorder ([place])    Chronic low back pain    CTS (carpal tunnel syndrome)    hands   Depression    Gallstones    GERD (gastroesophageal reflux disease)    History of prediabetes    Hypertension    Laceration of right foot    Onychomycosis    OSA on CPAP    does not use cpap   Overactive bladder    Pre-diabetes    diet controlled   PVC's (premature ventricular contractions)    PVC's  (premature ventricular contractions)    Dr. Raliegh Ip cards   Seizures [facility])    8th grade   Past Surgical History:  Procedure Laterality Date   ABDOMINAL HYSTERECTOMY  07/08/2011   CARPAL TUNNEL RELEASE     right hand 1996   CESAREAN SECTION     2012   CHOLECYSTECTOMY N/A 01/12/2018   Procedure: LAPAROSCOPIC CHOLECYSTECTOMY WITH INTRAOPERATIVE CHOLANGIOGRAM ERAS PATHWAY;  Surgeon: Johnathan Hausen, MD;  Location: [facility] ORS;  Service: General;  Laterality: N/A;   COLONOSCOPY WITH PROPOFOL N/A 04/15/2019   Procedure: COLONOSCOPY WITH PROPOFOL;  Surgeon: Jonathon Bellows, MD;  Location: [facility] ENDOSCOPY;  Service: Gastroenterology;  Laterality: N/A;   DILATION AND CURETTAGE OF UTERUS     04/2011 benign endometrial polyp squamous metaplasia, inflamm, blood, mucous, benign    ESOPHAGOGASTRODUODENOSCOPY (EGD) WITH PROPOFOL N/A 10/23/2020   Procedure: ESOPHAGOGASTRODUODENOSCOPY (EGD) WITH PROPOFOL;  Surgeon: Jonathon Bellows, MD;  Location: [facility] ENDOSCOPY;  Service: Gastroenterology;  Laterality: N/A;   FLEXIBLE SIGMOIDOSCOPY N/A 02/15/2021   Procedure: FLEXIBLE SIGMOIDOSCOPY;  Surgeon: Jonathon Bellows, MD;  Location: [facility] ENDOSCOPY;  Service: Gastroenterology;  Laterality: N/A;   FOOT SURGERY     Dr. Prudence Davidson    LEEP     2006   PANNICULECTOMY N/A 04/24/2022   Procedure: INFRAUMBILICAL PANNICULECTOMY;  Surgeon: Wallace Going, DO;  Location:  [place] OR;  Service: Plastics;  Laterality: N/A;   TUBAL LIGATION     Family History  Problem Relation Age of Onset   Heart failure Mother    Hypertension Mother    Early death Mother    Heart disease Mother        chf   Cancer Father        esophageal    Hypertension Father    Breast cancer Sister 97   Cancer Sister        breast   Depression Sister    Mental illness Sister    Asthma Son    Cancer Other        brain cancer   Cancer Paternal Aunt        brain cancer   Social History   Socioeconomic History   Marital status: Married    Spouse name: Not on file    Number of children: Not on file   Years of education: Not on file   Highest education level: Not on file  Occupational History   Not on file  Tobacco Use   Smoking status: Never   Smokeless tobacco: Former    Types: Snuff   Tobacco comments:    Is in process of decreasing amount  Vaping Use   Vaping Use: Never used  Substance and Sexual Activity   Alcohol use: No    Alcohol/week: 0.0 standard drinks of alcohol   Drug use: No   Sexual activity: Not Currently    Birth control/protection: Surgical    Comment: Hysterectomy  Other Topics Concern   Not on file  Social History Narrative   Married    No guns    Wears seat belt    Safe in relationship    Never smoker    2 sons Gaspar Bidding ~10 and another son ~28 as of 10/2019   1 granson eli 1 y.o as of 10/2019   2 sisters    Unemployed       Social Determinants of Health   Financial Resource Strain: Low Risk  (06/19/2022)   Overall Financial Resource Strain (CARDIA)    Difficulty of Paying Living Expenses: Not hard at all  Food Insecurity: No Food Insecurity (06/19/2022)   Hunger Vital Sign    Worried About Running Out of Food in the Last Year: Never true    Ran Out of Food in the Last Year: Never true  Transportation Needs: No Transportation Needs (06/19/2022)   PRAPARE - Hydrologist (Medical): No    Lack of Transportation (Non-Medical): No  Physical Activity: Insufficiently Active (07/07/2019)   Exercise Vital Sign    Days of Exercise per Week: 3 days    Minutes of Exercise per Session: 30 min  Stress: No Stress Concern Present (06/19/2022)   [place]    Feeling of Stress : Not at all  Social Connections: Unknown (06/19/2022)   Social Connection and Isolation Panel [NHANES]    Frequency of Communication with Friends and Family: More than three times a week    Frequency of Social Gatherings with Friends and Family: More than three  times a week    Attends Religious Services: Not on file    Active Member of Clubs or Organizations: Not on file    Attends Archivist Meetings: Not on file    Marital Status: Married  Intimate Partner Violence: Not At Risk (06/19/2022)   Humiliation, Afraid,  Rape, and Kick questionnaire    Fear of Current or Ex-Partner: No    Emotionally Abused: No    Physically Abused: No    Sexually Abused: No   Current Meds  Medication Sig   ACCU-CHEK GUIDE test strip Use as instructed   albuterol (VENTOLIN HFA) 108 (90 Base) MCG/ACT inhaler Inhale 1-2 puffs into the lungs every 6 (six) hours as needed for wheezing or shortness of breath.   amLODipine (NORVASC) 5 MG tablet Take 1 tablet (5 mg total) by mouth daily.   benztropine (COGENTIN) 1 MG tablet Take 1 mg by mouth 2 (two) times daily.   Blood Glucose Monitoring Suppl (ACCU-CHEK GUIDE ME) w/Device KIT Use as directed,once daily to test blood sugar   buPROPion (WELLBUTRIN XL) 150 MG 24 hr tablet Take 150 mg by mouth every morning.   celecoxib (CELEBREX) 200 MG capsule Take 1 capsule by mouth 2 (two) times daily as needed (pain).   cetirizine (ZYRTEC ALLERGY) 10 MG tablet Take 2 tablets (20 mg total) by mouth daily.   EASY COMFORT PEN NEEDLES 31G X 8 MM MISC INJECT VICTOZA ONCE DAILY AS DIRECTED   EPINEPHrine 0.3 mg/0.3 mL IJ SOAJ injection Inject 0.3 mg into the muscle as needed for anaphylaxis.   famotidine (PEPCID) 40 MG tablet Take 1 tablet (40 mg total) by mouth daily.   fexofenadine (ALLEGRA) 180 MG tablet Take 2 tablets (360 mg total) by mouth every morning. Prn only if needed either take zyrtec or allegra explain to patient   FLUoxetine (PROZAC) 40 MG capsule Take 40 mg by mouth daily.   fluPHENAZine (PROLIXIN) 2.5 MG tablet Take 2.5 mg by mouth 2 (two) times daily.   fluticasone (FLONASE) 50 MCG/ACT nasal spray Place 2 sprays into both nostrils daily.   furosemide (LASIX) 20 MG tablet Take 0.5-1 tablets (10-20 mg total) by mouth  daily as needed. (Patient taking differently: Take 20 mg by mouth daily as needed.)   hydrocortisone 2.5 % ointment Apply topically 2 (two) times daily. Neck   hydrOXYzine (ATARAX) 50 MG tablet Take 50 mg by mouth 4 (four) times daily as needed for anxiety.   Insulin Pen Needle (NOVOFINE) 30G X 8 MM MISC Use with victoza daily 1.8 mg   liraglutide (VICTOZA) 18 MG/3ML SOPN Inject 1.8 mg into the skin daily.   metoprolol succinate (TOPROL-XL) 25 MG 24 hr tablet Take 1 tablet (25 mg total) by mouth daily.   montelukast (SINGULAIR) 10 MG tablet Take 1 tablet (10 mg total) by mouth daily.   Multiple Vitamins-Minerals (ALIVE MULTI-VITAMIN PO) Take by mouth daily in the afternoon.   mupirocin ointment (BACTROBAN) 2 % Apply 1 Application topically 2 (two) times daily. Left leg wound   nicotine (NICODERM CQ - DOSED IN MG/24 HOURS) 14 mg/24hr patch Place 1 patch (14 mg total) onto the skin daily. X 45 days then go down to 7 mg patch   nicotine (NICODERM CQ - DOSED IN MG/24 HOURS) 14 mg/24hr patch Place 1 patch (14 mg total) onto the skin daily.   nicotine (NICODERM CQ - DOSED IN MG/24 HOURS) 21 mg/24hr patch Place 1 patch (21 mg total) onto the skin daily.   nicotine (NICODERM CQ - DOSED IN MG/24 HR) 7 mg/24hr patch Place 1 patch (7 mg total) onto the skin daily. After using 14 mg patch   nicotine (NICODERM CQ - DOSED IN MG/24 HR) 7 mg/24hr patch Place 1 patch (7 mg total) onto the skin daily.   omeprazole (PRILOSEC) 40  MG capsule Take 1 capsule (40 mg total) by mouth daily.   ondansetron (ZOFRAN-ODT) 4 MG disintegrating tablet Take 1 tablet (4 mg total) by mouth every 8 (eight) hours as needed for nausea or vomiting.   oxyCODONE-acetaminophen (PERCOCET) 10-325 MG tablet Take 1 tablet by mouth 3 (three) times daily as needed for pain.   QUEtiapine (SEROQUEL XR) 400 MG 24 hr tablet Take 400 mg by mouth at bedtime.   scopolamine (TRANSDERM-SCOP) 1 MG/3DAYS Place 1 patch (1.5 mg total) onto the skin every 3  (three) days. 4 hours before you get on boat   topiramate (TOPAMAX) 100 MG tablet Take 100 mg by mouth at bedtime.   valACYclovir (VALTREX) 1000 MG tablet Take 1 tablet (1,000 mg total) by mouth 2 (two) times daily. for 7 days prn   vitamin B-12 (CYANOCOBALAMIN) 1000 MCG tablet Take 1,000 mcg by mouth daily.   Allergies  Allergen Reactions   Cozaar [Losartan] Hives and Swelling    Angioedema   Keflex [Cephalexin] Swelling and Rash   Penicillins Rash    Has patient had a PCN reaction causing immediate rash, facial/tongue/throat swelling, SOB or lightheadedness with hypotension: Yes Has patient had a PCN reaction causing severe rash involving mucus membranes or skin necrosis: No Has patient had a PCN reaction that required hospitalization: No Has patient had a PCN reaction occurring within the last 10 years: No If all of the above answers are "NO", then may proceed with Cephalosporin use.    Zestril [Lisinopril] Hives and Other (See Comments)    Headache Low BP Angioedema     Hctz [Hydrochlorothiazide] Swelling and Rash   Bee Venom Swelling   Metformin And Related Rash   Recent Results (from the past 2160 hour(s))  Basic metabolic panel per protocol     Status: None   Collection Time: 04/24/22 12:20 PM  Result Value Ref Range   Sodium 141 135 - 145 mmol/L   Potassium 4.0 3.5 - 5.1 mmol/L   Chloride 109 98 - 111 mmol/L   CO2 24 22 - 32 mmol/L   Glucose, Bld 95 70 - 99 mg/dL    Comment: Glucose reference range applies only to samples taken after fasting for at least 8 hours.   BUN 8 6 - 20 mg/dL   Creatinine, Ser 0.96 0.44 - 1.00 mg/dL   Calcium 9.2 8.9 - 10.3 mg/dL   GFR, Estimated >60 >60 mL/min    Comment: (NOTE) Calculated using the CKD-EPI Creatinine Equation (2021)    Anion gap 8 5 - 15    Comment: Performed at [facility] [address] [place], [place] [address]  CBC per protocol     Status: Abnormal   Collection Time: 04/24/22 12:20 PM  Result Value Ref  Range   WBC 5.2 4.0 - 10.5 K/uL   RBC 3.82 (L) 3.87 - 5.11 MIL/uL   Hemoglobin 11.4 (L) 12.0 - 15.0 g/dL   HCT 31.5 (L) 36.0 - 46.0 %   MCV 82.5 80.0 - 100.0 fL   MCH 29.8 26.0 - 34.0 pg   MCHC 36.2 (H) 30.0 - 36.0 g/dL   RDW 14.7 11.5 - 15.5 %   Platelets 204 150 - 400 K/uL   nRBC 0.0 0.0 - 0.2 %    Comment: Performed at [facility], [place] [address] [place], [place] [address]  Surgical pathology     Status: None   Collection Time: 04/24/22  3:01 PM  Result Value Ref Range  SURGICAL PATHOLOGY      SURGICAL PATHOLOGY CASE: [phone] PATIENT: Heather Sweeney Surgical Pathology Report     Clinical History: panniculitis (cm)     FINAL MICROSCOPIC DIAGNOSIS:  A. PANNICULUS, PANNICULECTOMY: -  Unremarkable skin and fragments of adipose tissue with no significant pathology.   GROSS DESCRIPTION:  Received fresh is a 1533 g, 55 x 12 cm elliptical tan-brown unremarkable skin with underlying soft lobulated fatty tissue up to 6 cm in depth. Also included with the specimen are several separate soft lobulated fatty tissues, 7.6 x 5.6 x 2.2 cm in aggregate.  Sections through the skin with underlying fatty tissue and separate fatty tissues are unremarkable.  Representative sections are submitted 1 block.  SW 04/25/2022   Final Diagnosis performed by Tilford Pillar DO.   Electronically signed 04/28/2022 Technical component performed at [employer]. [facility], [place] [address], [place], [place] [address].  Professional component performed at [facility], [place] [address], [place], [place] [address].  Immunohistochemistry Technical component (if applicable) was performed at [facility]. [address] [place], [place], [place] [address].   IMMUNOHISTOCHEMISTRY DISCLAIMER (if applicable): Some of these immunohistochemical stains may have been developed and the performance characteristics determine by [facility].  Some may not have been cleared or approved by the U.S. Food and Drug Administration. The FDA has determined that such clearance or approval is not necessary. This test is used for clinical purposes. It should not be regarded as investigational or for research. This laboratory is certified under the [place] (CLIA-88) as qualified to perform high complexity clinical laboratory testing.  The controls stained appropriately.   CBC with Differential     Status: Abnormal   Collection Time: 05/17/22 12:47 PM  Result Value Ref Range   WBC 7.9 4.0 - 10.5 K/uL   RBC 3.27 (L) 3.87 - 5.11 MIL/uL   Hemoglobin 9.3 (L) 12.0 - 15.0 g/dL   HCT 26.6 (L) 36.0 - 46.0 %   MCV 81.3 80.0 - 100.0 fL   MCH 28.4 26.0 - 34.0 pg   MCHC 35.0 30.0 - 36.0 g/dL   RDW 13.4 11.5 - 15.5 %   Platelets 288 150 - 400 K/uL   nRBC 0.0 0.0 - 0.2 %   Neutrophils Relative % 67 %   Neutro Abs 5.3 1.7 - 7.7 K/uL   Lymphocytes Relative 21 %   Lymphs Abs 1.6 0.7 - 4.0 K/uL   Monocytes Relative 8 %   Monocytes Absolute 0.6 0.1 - 1.0 K/uL   Eosinophils Relative 3 %   Eosinophils Absolute 0.2 0.0 - 0.5 K/uL   Basophils Relative 1 %   Basophils Absolute 0.1 0.0 - 0.1 K/uL   Immature Granulocytes 0 %   Abs Immature Granulocytes 0.02 0.00 - 0.07 K/uL    Comment: Performed at [facility], [place] [place], [place] [address]  Comprehensive metabolic panel     Status: Abnormal   Collection Time: 05/17/22 12:47 PM  Result Value Ref Range   Sodium 139 135 - 145 mmol/L   Potassium 3.5 3.5 - 5.1 mmol/L   Chloride 108 98 - 111 mmol/L   CO2 25 22 - 32 mmol/L   Glucose, Bld 89 70 - 99 mg/dL    Comment: Glucose reference range applies only to samples taken after fasting for at least 8 hours.   BUN 11 6 - 20 mg/dL   Creatinine, Ser 1.06 (H) 0.44 -  1.00 mg/dL   Calcium 8.9 8.9 - 10.3 mg/dL   Total Protein 7.3 6.5 - 8.1 g/dL   Albumin 3.4 (L) 3.5 - 5.0 g/dL   AST 16 15 - 41  U/L   ALT 13 0 - 44 U/L   Alkaline Phosphatase 65 38 - 126 U/L   Total Bilirubin 0.4 0.3 - 1.2 mg/dL   GFR, Estimated >60 >60 mL/min    Comment: (NOTE) Calculated using the CKD-EPI Creatinine Equation (2021)    Anion gap 6 5 - 15    Comment: Performed at [facility], [address] [place], [place] [address]  POCT glycosylated hemoglobin (Hb A1C)     Status: None   Collection Time: 07/10/22  3:34 PM  Result Value Ref Range   Hemoglobin A1C 5.2 4.0 - 5.6 %   HbA1c POC (<> result, manual entry)     HbA1c, POC (prediabetic range)     HbA1c, POC (controlled diabetic range)     Objective  Body mass index is 39.75 kg/m. Wt Readings from Last 3 Encounters:  07/15/22 224 lb 6.4 oz (101.8 kg)  07/10/22 227 lb (103 kg)  06/25/22 218 lb 3.2 oz (99 kg)   Temp Readings from Last 3 Encounters:  07/15/22 98.8 F (37.1 C) (Oral)  07/10/22 98.4 F (36.9 C) (Oral)  06/25/22 97.8 F (36.6 C) (Oral)   BP Readings from Last 3 Encounters:  07/15/22 138/64  07/10/22 130/68  06/25/22 136/80   Pulse Readings from Last 3 Encounters:  07/15/22 91  07/10/22 96  06/25/22 83    Physical Exam Vitals and nursing note reviewed.  Constitutional:      Appearance: Normal appearance. She is well-developed and well-groomed.  HENT:     Head: Normocephalic and atraumatic.  Eyes:     Conjunctiva/sclera: Conjunctivae normal.     Pupils: Pupils are equal, round, and reactive to light.  Cardiovascular:     Rate and Rhythm: Normal rate and regular rhythm.     Heart sounds: Normal heart sounds. No murmur heard. Pulmonary:     Effort: Pulmonary effort is normal.     Breath sounds: Normal breath sounds.  Abdominal:     General: Abdomen is flat. Bowel sounds are normal.     Tenderness: There is no abdominal tenderness.  Musculoskeletal:        General: No tenderness.  Skin:    General: Skin is warm and dry.  Neurological:     General: No focal deficit present.     Mental Status: She  is alert and oriented to person, place, and time. Mental status is at baseline.     Cranial Nerves: Cranial nerves 2-12 are intact.     Motor: Motor function is intact.     Coordination: Coordination is intact.     Gait: Gait is intact.  Psychiatric:        Attention and Perception: Attention and perception normal.        Mood and Affect: Mood and affect normal.        Speech: Speech normal.        Behavior: Behavior normal. Behavior is cooperative.        Thought Content: Thought content normal.        Cognition and Memory: Cognition and memory normal.        Judgment: Judgment normal.     Assessment  Plan  Bilateral carpal tunnel syndrome Appt Dr. Apolonio Schneiders 08/01/22 10 am [facility] [place]  of right knee Abnormal MRI right knee  Appt Dr. Lyla Glassing 08/04/22 9:30 am [facility] [place]  Need for hepatitis B vaccination  Given rx to get 2 doses 1 month apart at pharmacy  HM See last visit   Provider: Dr. Olivia Mackie McLean-Scocuzza-Internal Medicine

## 2022-07-15 NOTE — Telephone Encounter (Signed)
Pt here for appt has been notified

## 2022-07-15 NOTE — Patient Instructions (Addendum)
10/29/16 EMG/NCS  Impression: Abnormal study. There is electrodiagnostic evidence of chronic, severe right and moderate left carpal tunnel syndrome.   Recommendation: Recommend referral to orthopedics for carpal tunnel release consultation.   Thank you for the referral of this patient. It was our privilege to participate in care of your patient. Feel free to contact us with any further questions. Gurney Maxin, M.D. (Neurology)   Dr. Lyla Glassing 08/04/22 9:30 am knee   Dr. Apolonio Schneiders 08/01/22 at 10 am hands    Phone Fax E-mail Address  [phone] Not available [address]   [address]   [place] [address]     Specialties     Orthopedic Surgery        Carpal Tunnel Syndrome  Carpal tunnel syndrome is a condition that causes pain, numbness, and weakness in your hand and fingers. The carpal tunnel is a narrow area located on the palm side of your wrist. Repeated wrist motion or certain diseases may cause swelling within the tunnel. This swelling pinches the main nerve in the wrist. The main nerve in the wrist is called the median nerve. What are the causes? This condition may be caused by: Repeated and forceful wrist and hand motions. Wrist injuries. Arthritis. A cyst or tumor in the carpal tunnel. Fluid buildup during pregnancy. Use of tools that vibrate. Sometimes the cause of this condition is not known. What increases the risk? The following factors may make you more likely to develop this condition: Having a job that requires you to repeatedly or forcefully move your wrist or hand or requires you to use tools that vibrate. This may include jobs that involve using computers, working on an [employer], or working with Elizabeth such as Pension scheme manager. Being a woman. Having certain conditions, such as: Diabetes. Obesity. An underactive thyroid (hypothyroidism). Kidney failure. Rheumatoid arthritis. What are the signs or symptoms? Symptoms of this  condition include: A tingling feeling in your fingers, especially in your thumb, index, and middle fingers. Tingling or numbness in your hand. An aching feeling in your entire arm, especially when your wrist and elbow are bent for a long time. Wrist pain that goes up your arm to your shoulder. Pain that goes down into your palm or fingers. A weak feeling in your hands. You may have trouble grabbing and holding items. Your symptoms may feel worse during the night. How is this diagnosed? This condition is diagnosed with a medical history and physical exam. You may also have tests, including: Electromyogram (EMG). This test measures electrical signals sent by your nerves into the muscles. Nerve conduction study. This test measures how well electrical signals pass through your nerves. Imaging tests, such as X-rays, ultrasound, and MRI. These tests check for possible causes of your condition. How is this treated? This condition may be treated with: Lifestyle changes. It is important to stop or change the activity that caused your condition. Doing exercise and activities to strengthen and stretch your muscles and tendons (physical therapy). Making lifestyle changes to help with your condition and learning how to do your daily activities safely (occupational therapy). Medicines for pain and inflammation. This may include medicine that is injected into your wrist. A wrist splint or brace. Surgery. Follow these instructions at home: If you have a splint or brace: Wear the splint or brace as told by your health care provider. Remove it only as told by your health care provider. Loosen the splint or brace if your fingers tingle, become  numb, or turn cold and blue. Keep the splint or brace clean. If the splint or brace is not waterproof: Do not let it get wet. Cover it with a watertight covering when you take a bath or shower. Managing pain, stiffness, and swelling If directed, put ice on the  painful area. To do this: If you have a removeable splint or brace, remove it as told by your health care provider. Put ice in a plastic bag. Place a towel between your skin and the bag or between the splint or brace and the bag. Leave the ice on for 20 minutes, 2-3 times a day. Do not fall asleep with the cold pack on your skin. Remove the ice if your skin turns bright red. This is very important. If you cannot feel pain, heat, or cold, you have a greater risk of damage to the area. Move your fingers often to reduce stiffness and swelling. General instructions Take over-the-counter and prescription medicines only as told by your health care provider. Rest your wrist and hand from any activity that may be causing your pain. If your condition is work related, talk with your employer about changes that can be made, such as getting a wrist pad to use while typing. Do any exercises as told by your health care provider, physical therapist, or occupational therapist. Keep all follow-up visits. This is important. Contact a health care provider if: You have new symptoms. Your pain is not controlled with medicines. Your symptoms get worse. Get help right away if: You have severe numbness or tingling in your wrist or hand. Summary Carpal tunnel syndrome is a condition that causes pain, numbness, and weakness in your hand and fingers. It is usually caused by repeated wrist motions. Lifestyle changes and medicines are used to treat carpal tunnel syndrome. Surgery may be recommended. Follow your health care provider's instructions about wearing a splint, resting from activity, keeping follow-up visits, and calling for help. This information is not intended to replace advice given to you by your health care provider. Make sure you discuss any questions you have with your health care provider. Document Revised: 02/02/2020 Document Reviewed: 02/02/2020 Elsevier Patient Education  [place].

## 2022-07-16 ENCOUNTER — Ambulatory Visit: Payer: Medicare Other | Admitting: Internal Medicine

## 2022-07-16 NOTE — Progress Notes (Signed)
Patient is a 56 year old female who underwent panniculectomy with Dr. Marla Roe on 05/02/2022.  Patient presents to the clinic today for postoperative follow-up.  Patient was last seen in the clinic on 06/26/2022.  At this visit, she reported she is doing well.  She did not have any specific complaints or concerns.  On exam, abdomen was soft and nontender.  The incision appear to be intact and healing well.  There did appear to be some firmness underneath the incision consistent with some scarring.  Plan was for patient to gently massage her scars and start using scar cream such as Mederma, Lyons or Silagen.  Patient was to follow-up in 3 weeks.  Today, patient reports she is doing well.  She denies any issues with the surgical site.  She states that she has been massaging the scar.  She overall is happy with her results.  Patient reports she has been going to healthy weight and wellness, and states that she feels she still has some fat to her abdomen medially.  Patient inquired about additional procedures for this and if we could prescribe her any medications to help with weight loss.  Chaperone present on exam.  On exam, patient sitting upright in no acute distress.  Abdomen is soft and nontender.  Incision appears to have healed well.  There is a little bit of firmness underneath the incision medially that appears consistent with scarring.  I discussed with the patient that she should continue to gently massage the scarring to help soften up the firmness.   I discussed with the patient that she should follow-up with her PCP regarding potential medications for weight loss.  I also discussed with the patient that she should continue to to work with healthy weight and wellness to lose weight before moving forward with any additional procedures.  I discussed with the patient that if she does want additional procedures in the future, they will most likely be cosmetic procedures.  Patient expressed  understanding.  I discussed with the patient that if she is still having some bothersome areas and would like to talk more about potential procedures that she can follow-up with [place] in 6 months to a year.   Patient to follow-up as needed.  Instructed patient to call if she has any questions or concerns.

## 2022-07-17 ENCOUNTER — Encounter: Payer: Self-pay | Admitting: Student

## 2022-07-17 ENCOUNTER — Ambulatory Visit (INDEPENDENT_AMBULATORY_CARE_PROVIDER_SITE_OTHER): Payer: Medicare Other | Admitting: Student

## 2022-07-17 DIAGNOSIS — H04123 Dry eye syndrome of bilateral lacrimal glands: Secondary | ICD-10-CM | POA: Diagnosis not present

## 2022-07-17 DIAGNOSIS — M793 Panniculitis, unspecified: Secondary | ICD-10-CM

## 2022-07-22 NOTE — Telephone Encounter (Signed)
Has appt with Dr. Volanda Napoleon

## 2022-07-23 ENCOUNTER — Telehealth: Payer: Self-pay

## 2022-07-23 NOTE — Telephone Encounter (Signed)
Patient states she is interested in taking Golo for weight loss and would like to have a call back.

## 2022-07-24 NOTE — Telephone Encounter (Signed)
Informed pt that Heather Sweeney has not been medically evaluated by the FDA & is not rec at this time. Pt verbalized understanding to info.

## 2022-07-24 NOTE — Unmapped (Signed)
[facility] Clinical Assessment & Refill Coordination Note    Renee Cunningham, DOB: [birth year]  Phone: [phone] (home) [phone] (work)    All above HIPAA information was verified with patient.     Was a Nurse, learning disability used for this call? No    Specialty Medication(s):   Inflammatory Disorders: Xolair     Current Outpatient Medications   Medication Sig Dispense Refill    ALCOHOL PADS PadM Use as directed,once daily to test blood sugar      amLODIPine (NORVASC) 5 MG tablet       benztropine (COGENTIN) 1 MG tablet Take 1 tablet (1 mg total) by mouth daily.      cetirizine (ZYRTEC) 10 MG tablet TAKE 2 TABLETS BY MOUTH ONCE DAILY AT NIGHT 60 tablet 0    clobetasoL (TEMOVATE) 0.05 % cream Apply 1-2 times a day to the raised itchy spots on your body as needed . 30 g 0    clotrimazole (LOTRIMIN) 1 % cream APPLY TOPICALLY IN THE MORNING AND AT BEDTIME      clotrimazole-betamethasone (LOTRISONE) 1-0.05 % cream APPLY EXTERNALLY TWICE DAILY AS NEEDED FOR SYMPTOMS FOR UP TO 2 WEEKS      clotrimazole-betamethasone (LOTRISONE) 1-0.05 % cream Apply externally BID prn sx up to 2 wks      empty container Misc Use as directed to dispose of Xolair syringes 1 each 3    EPINEPHrine (EPIPEN) 0.3 mg/0.3 mL injection Inject 0.3 mL (0.3 mg total) into the muscle once for 1 dose. May repeat in 5 minutes if symptoms are not improved or worsening. 1 each PRN    famotidine (PEPCID) 20 MG tablet 1 tablet (20 mg total) two (2) times a day.      ferrous sulfate 325 (65 FE) MG tablet Take 1 tablet (325 mg total) by mouth in the morning.      FLUoxetine (PROZAC) 10 MG capsule TAKE 1 CAPSULE BY MOUTH IN THE MORNING      fluPHENAZine (PROLIXIN) 2.5 MG tablet Take 1 tablet (2.5 mg total) by mouth daily.      fluticasone propionate (FLONASE) 50 mcg/actuation nasal spray Place 2 sprays into both nostrils daily.      hydrocortisone 2.5 % lotion Apply 1-2 times a day to the raised itchy spots as needed on your face 59 mL 0    hydrOXYchloroQUINE (PLAQUENIL) 200 mg tablet Take 1 tablet (200 mg total) by mouth Two (2) times a day. 60 tablet 12    hydrOXYzine (ATARAX) 25 MG tablet Take 1 tablet (25 mg total) by mouth two (2) times a day as needed for itching (hives). 60 tablet 3    melatonin 5 mg Tab Take 1 tablet (5 mg total) by mouth nightly.      metoprolol succinate (TOPROL-XL) 25 MG 24 hr tablet       montelukast (SINGULAIR) 10 mg tablet Take 1 tablet (10 mg total) by mouth nightly. 90 tablet 3    mupirocin (BACTROBAN) 2 % ointment       omalizumab (XOLAIR) 150 mg/mL syringe Inject the contents of 2 syringes (300 mg total) under the skin every twenty-one (21) days. 2 mL 12    ONETOUCH ULTRA TEST Strp Use as directed,once daily to test blood sugar      oxyCODONE-acetaminophen (PERCOCET) 10-325 mg per tablet TAKE 1 TABLET BY MOUTH THREE TIMES DAILY AS NEEDED FOR CHRONIC PAIN      topiramate (TOPAMAX) 100 MG tablet TAKE 1 & 1/2 (ONE &  ONE-HALF) TABLETS BY MOUTH IN THE EVENING      triamcinolone (KENALOG) 0.1 % cream Apply topically to affected areas twice daily as needed for rash. 454 g 2    triamcinolone (KENALOG) 0.1 % ointment Apply twice a day as needed only, only to areas with active hives breakouts. 80 g 6    VICTOZA 3-PAK 0.6 mg/0.1 mL (18 mg/3 mL) injection Inject 1.8 mg into the skin daily.       No current facility-administered medications for this visit.        Changes to medications: Yoltzin reports no changes at this time.    Allergies   Allergen Reactions    Cephalexin Rash    Hydrochlorothiazide Swelling     Mild she says    Losartan Hives and Swelling     Angioedema  Angioedema      Penicillins Rash    Metformin      Rash  Other reaction(s): Other (See Comments)  Rash    Venom-Honey Bee      Other reaction(s): Other (See Comments)  Swelling  Swelling      Lisinopril Other (See Comments)     Low BP       Changes to allergies: No    SPECIALTY MEDICATION ADHERENCE     Xolair 150 mg/ml: 0 days of medicine on hand Medication Adherence    Patient reported X missed doses in the last month: 0  Specialty Medication: Xolair 150 mg/mL Q21d  Patient is on additional specialty medications: No  Patient is on more than two specialty medications: No  Any gaps in refill history greater than 2 weeks in the last 3 months: no  Demonstrates understanding of importance of adherence: yes  Informant: patient                            Specialty medication(s) dose(s) confirmed: Regimen is correct and unchanged.     Are there any concerns with adherence? No    Adherence counseling provided? Not needed    CLINICAL MANAGEMENT AND INTERVENTION      Clinical Benefit Assessment:    Do you feel the medicine is effective or helping your condition? Yes    Clinical Benefit counseling provided? Not needed    Adverse Effects Assessment:    Are you experiencing any side effects? No    Are you experiencing difficulty administering your medicine? No    Quality of Life Assessment:    Quality of Life    Rheumatology  Oncology  Dermatology  Cystic Fibrosis          How many days over the past month did your idiopathic urticaria  keep you from your normal activities? For example, brushing your teeth or getting up in the morning. 0    Have you discussed this with your provider? Not needed    Acute Infection Status:    Acute infections noted within Epic:  No active infections  Patient reported infection: None    Therapy Appropriateness:    Is therapy appropriate and patient progressing towards therapeutic goals? Yes, therapy is appropriate and should be continued    DISEASE/MEDICATION-SPECIFIC INFORMATION      For patients on injectable medications: Patient currently has 0 doses left.  Next injection is scheduled for 10/31.    Chronic Inflammatory Diseases: Have you experienced any flares in the last month? No    PATIENT SPECIFIC NEEDS     Does the patient  have any physical, cognitive, or cultural barriers? No    Is the patient high risk? No    Did the patient require a clinical intervention? No    Does the patient require physician intervention or other additional services (i.e., nutrition, smoking cessation, social work)? No    SOCIAL DETERMINANTS OF HEALTH     At the [facility], we have learned that life circumstances - like trouble affording food, housing, utilities, or transportation can affect the health of many of our patients.   That is why we wanted to ask: are you currently experiencing any life circumstances that are negatively impacting your health and/or quality of life? Patient declined to answer    Social Determinants of Health     Financial Resource Strain: Not on file   Internet Connectivity: Not on file   Food Insecurity: Not on file   Tobacco Use: Medium Risk (07/10/2022)    Patient History     Smoking Tobacco Use: Never     Smokeless Tobacco Use: Former     Passive Exposure: Not on file   Housing/Utilities: Not on file   Alcohol Use: Not on file   Transportation Needs: Not on file   Substance Use: Not on file   Health Literacy: Not on file   Physical Activity: Not on file   Interpersonal Safety: Not on file   Stress: Not on file   Intimate Partner Violence: Not on file   Depression: Not on file   Social Connections: Not on file       Would you be willing to receive help with any of the needs that you have identified today? Not applicable       SHIPPING     Specialty Medication(s) to be Shipped:   Inflammatory Disorders: Xolair    Other medication(s) to be shipped: No additional medications requested for fill at this time     Changes to insurance: No    Delivery Scheduled: Yes, Expected medication delivery date: 08/01/22.     Medication will be delivered via UPS to the confirmed prescription address in [facility].    The patient will receive a drug information handout for each medication shipped and additional FDA Medication Guides as required.  Verified that patient has previously received a Conservation officer, historic buildings and a Surveyor, mining.    The patient or caregiver noted above participated in the development of this care plan and knows that they can request review of or adjustments to the care plan at any time.      All of the patient's questions and concerns have been addressed.    [name], PharmD   [facility] Specialty Pharmacist

## 2022-07-25 DIAGNOSIS — G4733 Obstructive sleep apnea (adult) (pediatric): Secondary | ICD-10-CM | POA: Diagnosis not present

## 2022-07-28 ENCOUNTER — Ambulatory Visit: Payer: Medicare Other

## 2022-07-31 MED FILL — XOLAIR 150 MG/ML SUBCUTANEOUS SYRINGE: SUBCUTANEOUS | 21 days supply | Qty: 2 | Fill #1

## 2022-08-01 DIAGNOSIS — G5603 Carpal tunnel syndrome, bilateral upper limbs: Secondary | ICD-10-CM | POA: Diagnosis not present

## 2022-08-04 ENCOUNTER — Encounter (INDEPENDENT_AMBULATORY_CARE_PROVIDER_SITE_OTHER): Payer: Self-pay

## 2022-08-04 DIAGNOSIS — Z5181 Encounter for therapeutic drug level monitoring: Secondary | ICD-10-CM | POA: Diagnosis not present

## 2022-08-04 DIAGNOSIS — G894 Chronic pain syndrome: Secondary | ICD-10-CM | POA: Diagnosis not present

## 2022-08-04 DIAGNOSIS — Z79891 Long term (current) use of opiate analgesic: Secondary | ICD-10-CM | POA: Diagnosis not present

## 2022-08-04 DIAGNOSIS — M47816 Spondylosis without myelopathy or radiculopathy, lumbar region: Secondary | ICD-10-CM | POA: Diagnosis not present

## 2022-08-04 DIAGNOSIS — M25569 Pain in unspecified knee: Secondary | ICD-10-CM | POA: Diagnosis not present

## 2022-08-04 DIAGNOSIS — M797 Fibromyalgia: Secondary | ICD-10-CM | POA: Diagnosis not present

## 2022-08-04 DIAGNOSIS — M5451 Vertebrogenic low back pain: Secondary | ICD-10-CM | POA: Diagnosis not present

## 2022-08-06 ENCOUNTER — Telehealth: Payer: Self-pay | Admitting: Pharmacist

## 2022-08-06 DIAGNOSIS — G4733 Obstructive sleep apnea (adult) (pediatric): Secondary | ICD-10-CM | POA: Diagnosis not present

## 2022-08-06 NOTE — Progress Notes (Signed)
This patient is appearing on the insurance-provided list for being at risk of failing the adherence measure for Statin Use in Persons with Diabetes (SUPD) medications this calendar year. She is in this measure due to filling Victoza.   Previously on pravastatin therapy. Appears it was discontinued at the time of urticaria, though further dermatologic testing diagnosed as hives and appeared to indicate it was not a drug related eruption.  Consider benefit vs risk of re-initiation of statin therapy moving forward.   Clinical ASCVD: No  The 10-year ASCVD risk score (Arnett DK, et al., 2019) is: 12.1%   Values used to calculate the score:     Age: 56 years     Sex: Female     Is Non-Hispanic African American: Yes     Diabetic: Yes     Tobacco smoker: No     Systolic Blood Pressure: 341 mmHg     Is BP treated: Yes     HDL Cholesterol: 51.5 mg/dL     Total Cholesterol: 160 mg/dL   Recommend consideration for moderate intensity statin due to 10 year ASCVD risk.   Called patient, left voicemail for her to return my call at her convenience.   Catie Hedwig Morton, PharmD, [facility] [phone]

## 2022-08-11 ENCOUNTER — Other Ambulatory Visit: Payer: Self-pay | Admitting: Pharmacist

## 2022-08-11 ENCOUNTER — Encounter: Payer: Self-pay | Admitting: Pharmacist

## 2022-08-11 NOTE — Progress Notes (Unsigned)
Care Coordination Call  This patient is appearing on the insurance-provided list for being at risk of failing the adherence measure for Statin Use in Persons with Diabetes (SUPD) medications this calendar year. She is in this measure due to filling Victoza.    Previously on pravastatin therapy. Appears it was discontinued at the time of urticaria, though further dermatologic testing diagnosed as hives and appeared to indicate it was not a drug related eruption. Patient denies history of intolerance to pravastatin.   Discussed family history. She is unaware of any immediate family history of ASCVD. 10 year ASCVD risk of 5-10% (5% if choosing no history of tobacco use, 10% for tobacco use- patient uses dip daily). Risk enhancer of CKD and fatty liver disease. Recommend moderate intensity statin due to 10 year ASCVD risk + risk enhacing factors.   Recommend rosuvastatin 5 mg daily due to lower risk of drug interactions than prior pravastatin. Patient amenable. Will route to current PCP for consideration.   Catie Hedwig Morton, PharmD, [facility] [phone]

## 2022-08-12 NOTE — Progress Notes (Signed)
Discussed with oncoming PCP. She will evaluate with patient and consider coronary artery CT when she establishes with patient in December.   Catie Hedwig Morton, PharmD, [facility] [phone]

## 2022-08-19 NOTE — Unmapped (Signed)
[facility] Refill Coordination Note    Specialty Medication(s) to be Shipped:   CF/Pulmonary/Asthma: Xolair 150mg /ml    Other medication(s) to be shipped: No additional medications requested for fill at this time     Renee Cunningham, DOB: 1966/06/25  Phone: [phone] (home) [phone] (work)      All above HIPAA information was verified with patient.     Was a Nurse, learning disability used for this call? No    Completed refill call assessment today to schedule patient's medication shipment from the [facility] [phone]).  All relevant notes have been reviewed.     Specialty medication(s) and dose(s) confirmed: Regimen is correct and unchanged.   Changes to medications: Sheryn reports no changes at this time.  Changes to insurance: No  New side effects reported not previously addressed with a pharmacist or physician: None reported  Questions for the pharmacist: No    Confirmed patient received a Conservation officer, historic buildings and a Surveyor, mining with first shipment. The patient will receive a drug information handout for each medication shipped and additional FDA Medication Guides as required.       DISEASE/MEDICATION-SPECIFIC INFORMATION        For patients on injectable medications: Patient currently has 0 doses left.  Next injection is scheduled for 08/22/22.    SPECIALTY MEDICATION ADHERENCE     Medication Adherence    Specialty Medication: XOLAIR 150 mg/mL syringe (omalizumab)  Patient is on additional specialty medications: No  Patient is on more than two specialty medications: No  Informant: patient                                Were doses missed due to medication being on hold? No    XOLAIR 150 mg/mL syringe (omalizumab)  : 0 days of medicine on hand       REFERRAL TO PHARMACIST     Referral to the pharmacist: Not needed      [facility]     Shipping address confirmed in Epic.     Delivery Scheduled: Yes, Expected medication delivery date: 08/21/22.     Medication will be delivered via UPS to the prescription address in Epic [facility].    Roland Prine[facility] Specialty Technician

## 2022-08-20 MED FILL — XOLAIR 150 MG/ML SUBCUTANEOUS SYRINGE: SUBCUTANEOUS | 21 days supply | Qty: 2 | Fill #2

## 2022-08-24 DIAGNOSIS — I890 Lymphedema, not elsewhere classified: Secondary | ICD-10-CM | POA: Insufficient documentation

## 2022-08-24 HISTORY — DX: Lymphedema, not elsewhere classified: I89.0

## 2022-08-24 NOTE — Progress Notes (Unsigned)
MRN : [id number]  Heather Sweeney is a 56 y.o. (1966/05/28) female who presents with chief complaint of legs swell.  History of Present Illness:   The patient returns to the office for followup evaluation regarding leg swelling.  The swelling has improved quite a bit with the daily use of her lymph pump.  The pain associated with swelling is decreased. Today she is describing right knee pain (she has an appointment with the orthopedic surgery service in [facility] tomorrow) as well as pain that radiates down the back of her right leg toward the knee (this seems to occur for example when she is trying to get into the car).  There have not been any interval development of a ulcerations or wounds.  Since the previous visit the patient has been wearing graduated compression stockings and has noted some improvement in the lymphedema. The patient has been using compression routinely morning until night.  The patient also states elevation during the day and exercise (such as walking) is being done too.  She also feels the lymphedema pump has been a very positive factor.    No outpatient medications have been marked as taking for the 08/25/22 encounter (Appointment) with Delana Meyer, Dolores Lory, MD.    Past Medical History:  Diagnosis Date   Allergic rhinitis    Anxiety    Arthritis of knee, right    Asthma    Bipolar disorder ([place])    Chronic low back pain    CTS (carpal tunnel syndrome)    hands   Depression    Gallstones    GERD (gastroesophageal reflux disease)    History of prediabetes    Hypertension    Laceration of right foot    Onychomycosis    OSA on CPAP    does not use cpap   Overactive bladder    Pre-diabetes    diet controlled   PVC's (premature ventricular contractions)    PVC's (premature ventricular contractions)    Dr. Raliegh Ip cards   Seizures [facility])    8th grade    Past Surgical History:  Procedure Laterality Date   ABDOMINAL HYSTERECTOMY  07/08/2011   CARPAL  TUNNEL RELEASE     right hand 1996   CESAREAN SECTION     2012   CHOLECYSTECTOMY N/A 01/12/2018   Procedure: LAPAROSCOPIC CHOLECYSTECTOMY WITH INTRAOPERATIVE CHOLANGIOGRAM ERAS PATHWAY;  Surgeon: Johnathan Hausen, MD;  Location: [facility];  Service: General;  Laterality: N/A;   COLONOSCOPY WITH PROPOFOL N/A 04/15/2019   Procedure: COLONOSCOPY WITH PROPOFOL;  Surgeon: Jonathon Bellows, MD;  Location: [facility] ENDOSCOPY;  Service: Gastroenterology;  Laterality: N/A;   DILATION AND CURETTAGE OF UTERUS     04/2011 benign endometrial polyp squamous metaplasia, inflamm, blood, mucous, benign    ESOPHAGOGASTRODUODENOSCOPY (EGD) WITH PROPOFOL N/A 10/23/2020   Procedure: ESOPHAGOGASTRODUODENOSCOPY (EGD) WITH PROPOFOL;  Surgeon: Jonathon Bellows, MD;  Location: [facility] ENDOSCOPY;  Service: Gastroenterology;  Laterality: N/A;   FLEXIBLE SIGMOIDOSCOPY N/A 02/15/2021   Procedure: FLEXIBLE SIGMOIDOSCOPY;  Surgeon: Jonathon Bellows, MD;  Location: [facility] ENDOSCOPY;  Service: Gastroenterology;  Laterality: N/A;   FOOT SURGERY     Dr. Prudence Davidson    LEEP     2006   PANNICULECTOMY N/A 04/24/2022   Procedure: INFRAUMBILICAL PANNICULECTOMY;  Surgeon: Wallace Going, DO;  Location: [place];  Service: Plastics;  Laterality: N/A;   TUBAL LIGATION      Social History Social History   Tobacco Use   Smoking status: Never   Smokeless tobacco:  Current    Types: Chew  Vaping Use   Vaping Use: Never used  Substance Use Topics   Alcohol use: No    Alcohol/week: 0.0 standard drinks of alcohol   Drug use: No    Family History Family History  Problem Relation Age of Onset   Heart failure Mother    Hypertension Mother    Early death Mother    Heart disease Mother        chf   Cancer Father        esophageal    Hypertension Father    Breast cancer Sister 66   Cancer Sister        breast   Depression Sister    Mental illness Sister    Asthma Son    Cancer Other        brain cancer   Cancer Paternal Aunt        brain cancer     Allergies  Allergen Reactions   Cozaar [Losartan] Hives and Swelling    Angioedema   Keflex [Cephalexin] Swelling and Rash   Penicillins Rash    Has patient had a PCN reaction causing immediate rash, facial/tongue/throat swelling, SOB or lightheadedness with hypotension: Yes Has patient had a PCN reaction causing severe rash involving mucus membranes or skin necrosis: No Has patient had a PCN reaction that required hospitalization: No Has patient had a PCN reaction occurring within the last 10 years: No If all of the above answers are "NO", then may proceed with Cephalosporin use.    Zestril [Lisinopril] Hives and Other (See Comments)    Headache Low BP Angioedema     Hctz [Hydrochlorothiazide] Swelling and Rash   Bee Venom Swelling   Metformin And Related Rash     REVIEW OF SYSTEMS (Negative unless checked)  Constitutional: '[]'$ Weight loss  '[]'$ Fever  '[]'$ Chills Cardiac: '[]'$ Chest pain   '[]'$ Chest pressure   '[]'$ Palpitations   '[]'$ Shortness of breath when laying flat   '[]'$ Shortness of breath with exertion. Vascular:  '[]'$ Pain in legs with walking   '[x]'$ Pain in legs with standing  '[]'$ History of DVT   '[]'$ Phlebitis   '[x]'$ Swelling in legs   '[]'$ Varicose veins   '[]'$ Non-healing ulcers Pulmonary:   '[]'$ Uses home oxygen   '[]'$ Productive cough   '[]'$ Hemoptysis   '[]'$ Wheeze  '[]'$ COPD   '[]'$ Asthma Neurologic:  '[]'$ Dizziness   '[]'$ Seizures   '[]'$ History of stroke   '[]'$ History of TIA  '[]'$ Aphasia   '[]'$ Vissual changes   '[]'$ Weakness or numbness in arm   '[]'$ Weakness or numbness in leg Musculoskeletal:   '[]'$ Joint swelling   '[x]'$ Joint pain   '[]'$ Low back pain Hematologic:  '[]'$ Easy bruising  '[]'$ Easy bleeding   '[]'$ Hypercoagulable state   '[]'$ Anemic Gastrointestinal:  '[]'$ Diarrhea   '[]'$ Vomiting  '[x]'$ Gastroesophageal reflux/heartburn   '[]'$ Difficulty swallowing. Genitourinary:  '[]'$ Chronic kidney disease   '[]'$ Difficult urination  '[]'$ Frequent urination   '[]'$ Blood in urine Skin:  '[]'$ Rashes   '[]'$ Ulcers  Psychological:  '[]'$ History of anxiety   '[]'$  History of major  depression.  Physical Examination  There were no vitals filed for this visit. There is no height or weight on file to calculate BMI. Gen: WD/WN, NAD Head: [place]/AT, No temporalis wasting.  Ear/Nose/Throat: Hearing grossly intact, nares w/o erythema or drainage, pinna without lesions Eyes: PER, EOMI, sclera nonicteric.  Neck: Supple, no gross masses.  No JVD.  Pulmonary:  Good air movement, no audible wheezing, no use of accessory muscles.  Cardiac: RRR, precordium not hyperdynamic. Vascular:  scattered varicosities present bilaterally.  Mild venous stasis  changes to the legs bilaterally.  2+ soft pitting edema, CEAP [id number]  Vessel Right Left  Radial Palpable Palpable  Gastrointestinal: soft, non-distended. No guarding/no peritoneal signs.  Musculoskeletal: M/S 5/5 throughout.  No deformity.  Neurologic: CN 2-12 intact. Pain and light touch intact in extremities.  Symmetrical.  Speech is fluent. Motor exam as listed above. Psychiatric: Judgment intact, Mood & affect appropriate for pt's clinical situation. Dermatologic: Venous rashes no ulcers noted.  No changes consistent with cellulitis. Lymph : No lichenification or skin changes of chronic lymphedema.  CBC Lab Results  Component Value Date   WBC 7.9 05/17/2022   HGB 9.3 (L) 05/17/2022   HCT 26.6 (L) 05/17/2022   MCV 81.3 05/17/2022   PLT 288 05/17/2022    BMET    Component Value Date/Time   NA 139 05/17/2022 1247   NA 143 03/04/2022 1553   NA 138 09/17/2014 0549   K 3.5 05/17/2022 1247   K 4.0 09/17/2014 0549   CL 108 05/17/2022 1247   CL 103 09/17/2014 0549   CO2 25 05/17/2022 1247   CO2 28 09/17/2014 0549   GLUCOSE 89 05/17/2022 1247   GLUCOSE 105 (H) 09/17/2014 0549   BUN 11 05/17/2022 1247   BUN 8 03/04/2022 1553   BUN 9 09/17/2014 0549   CREATININE 1.06 (H) 05/17/2022 1247   CREATININE 1.01 05/19/2019 1511   CALCIUM 8.9 05/17/2022 1247   CALCIUM 9.2 09/17/2014 0549   GFRNONAA >60 05/17/2022 1247    GFRNONAA >60 09/17/2014 0549   GFRNONAA >60 04/04/2013 1451   GFRAA >60 01/06/2018 1050   GFRAA >60 09/17/2014 0549   GFRAA >60 04/04/2013 1451   CrCl cannot be calculated (Patient's most recent lab result is older than the maximum 21 days allowed.).  COAG No results found for: "INR", "PROTIME"  Radiology No results found.   Assessment/Plan 1. Lymphedema Recommend:  No surgery or intervention at this point in time.  It sounds like the 2 primary troubles she is having with her leg are likely related to DJD, mostly of the knee but possibly of the lumbar sacral spine.  She is following up with Ortho tomorrow but I did mention also possible evaluation by the spine service here at [facility].  I have reviewed my discussion with the patient regarding lymphedema and why it  causes symptoms.  Patient will continue wearing graduated compression on a daily basis. The patient should put the compression on first thing in the morning and removing them in the evening. The patient should not sleep in the compression.   In addition, behavioral modification throughout the day will be continued.  This will include frequent elevation (such as in a recliner), use of over the counter pain medications as needed and exercise such as walking.  The systemic causes for chronic edema such as liver, kidney and cardiac etiologies does not appear to have significant changed over the past year.    The patient will continue aggressive use of the  lymph pump.  This will continue to improve the edema control and prevent sequela such as ulcers and infections.   The patient will follow-up with me on an annual basis.   2. Essential hypertension Continue antihypertensive medications as already ordered, these medications have been reviewed and there are no changes at this time.  3. Gastroesophageal reflux disease, unspecified whether esophagitis present Continue PPI as already ordered, this medication has been  reviewed and there are no changes at this time.  Avoidence of caffeine  and alcohol  Moderate elevation of the head of the bed   4. Lumbosacral radiculitis It sounds like the 2 primary troubles she is having with her leg are likely related to DJD, mostly of the knee but possibly of the lumbar sacral spine.  She is following up with Ortho tomorrow but I did mention also possible evaluation by the spine service here at [facility].  Continue NSAID medications as already ordered, these medications have been reviewed and there are no changes at this time.  Continued activity and therapy was stressed.  5. Hyperlipidemia, unspecified hyperlipidemia type Continue statin as ordered and reviewed, no changes at this time    Hortencia Pilar, MD  08/24/2022 3:46 PM

## 2022-08-25 ENCOUNTER — Ambulatory Visit (INDEPENDENT_AMBULATORY_CARE_PROVIDER_SITE_OTHER): Payer: Medicare Other | Admitting: Vascular Surgery

## 2022-08-25 ENCOUNTER — Encounter (INDEPENDENT_AMBULATORY_CARE_PROVIDER_SITE_OTHER): Payer: Self-pay | Admitting: Vascular Surgery

## 2022-08-25 VITALS — BP 147/88 | HR 98 | Resp 18 | Ht 63.0 in | Wt 222.8 lb

## 2022-08-25 DIAGNOSIS — M5417 Radiculopathy, lumbosacral region: Secondary | ICD-10-CM | POA: Diagnosis not present

## 2022-08-25 DIAGNOSIS — I890 Lymphedema, not elsewhere classified: Secondary | ICD-10-CM

## 2022-08-25 DIAGNOSIS — E785 Hyperlipidemia, unspecified: Secondary | ICD-10-CM

## 2022-08-25 DIAGNOSIS — M1711 Unilateral primary osteoarthritis, right knee: Secondary | ICD-10-CM | POA: Diagnosis not present

## 2022-08-25 DIAGNOSIS — K219 Gastro-esophageal reflux disease without esophagitis: Secondary | ICD-10-CM | POA: Diagnosis not present

## 2022-08-25 DIAGNOSIS — I10 Essential (primary) hypertension: Secondary | ICD-10-CM

## 2022-08-25 DIAGNOSIS — M7051 Other bursitis of knee, right knee: Secondary | ICD-10-CM | POA: Diagnosis not present

## 2022-08-25 DIAGNOSIS — M25561 Pain in right knee: Secondary | ICD-10-CM | POA: Diagnosis not present

## 2022-09-03 NOTE — Unmapped (Signed)
[facility] Refill Coordination Note    Specialty Medication(s) to be Shipped:   CF/Pulmonary/Asthma: Xolair 150mg /ml    Other medication(s) to be shipped: No additional medications requested for fill at this time     Renee Cunningham, DOB: Sep 15, 1966  Phone: [phone] (home) [phone] (work)      All above HIPAA information was verified with patient.     Was a Nurse, learning disability used for this call? No    Completed refill call assessment today to schedule patient's medication shipment from the [facility] [phone]).  All relevant notes have been reviewed.     Specialty medication(s) and dose(s) confirmed: Regimen is correct and unchanged.   Changes to medications: Angelyse reports no changes at this time.  Changes to insurance: No  New side effects reported not previously addressed with a pharmacist or physician: None reported  Questions for the pharmacist: No    Confirmed patient received a Conservation officer, historic buildings and a Surveyor, mining with first shipment. The patient will receive a drug information handout for each medication shipped and additional FDA Medication Guides as required.       DISEASE/MEDICATION-SPECIFIC INFORMATION        For patients on injectable medications: Patient currently has 0 doses left.  Next injection is scheduled for 09/11/22.    SPECIALTY MEDICATION ADHERENCE     Medication Adherence    Patient reported X missed doses in the last month: 0  Specialty Medication: XOLAIR 150 mg/mL syringe (omalizumab)  Patient is on additional specialty medications: No  Patient is on more than two specialty medications: No  Any gaps in refill history greater than 2 weeks in the last 3 months: no  Demonstrates understanding of importance of adherence: yes  Informant: mother  Reliability of informant: reliable  Provider-estimated medication adherence level: good  Patient is at risk for Non-Adherence: No  Reasons for non-adherence: no problems identified Were doses missed due to medication being on hold? No    XOLAIR 150 mg/mL syringe (omalizumab)  : 0 days of medicine on hand        REFERRAL TO PHARMACIST     Referral to the pharmacist: Not needed      [facility]     Shipping address confirmed in Epic.     Delivery Scheduled: Yes, Expected medication delivery date: 09/09/22.     Medication will be delivered via UPS to the prescription address in Epic WAM.    Debi Cousin' W Wilhemena Durie Shared [facility] Specialty Technician

## 2022-09-07 DIAGNOSIS — M7051 Other bursitis of knee, right knee: Secondary | ICD-10-CM | POA: Insufficient documentation

## 2022-09-08 DIAGNOSIS — Z79891 Long term (current) use of opiate analgesic: Secondary | ICD-10-CM | POA: Diagnosis not present

## 2022-09-08 DIAGNOSIS — M25569 Pain in unspecified knee: Secondary | ICD-10-CM | POA: Diagnosis not present

## 2022-09-08 DIAGNOSIS — Z5181 Encounter for therapeutic drug level monitoring: Secondary | ICD-10-CM | POA: Diagnosis not present

## 2022-09-08 DIAGNOSIS — G894 Chronic pain syndrome: Secondary | ICD-10-CM | POA: Diagnosis not present

## 2022-09-08 DIAGNOSIS — M5451 Vertebrogenic low back pain: Secondary | ICD-10-CM | POA: Diagnosis not present

## 2022-09-08 MED FILL — XOLAIR 150 MG/ML SUBCUTANEOUS SYRINGE: SUBCUTANEOUS | 21 days supply | Qty: 2 | Fill #3

## 2022-09-23 ENCOUNTER — Encounter: Payer: Self-pay | Admitting: Family Medicine

## 2022-09-23 ENCOUNTER — Telehealth (INDEPENDENT_AMBULATORY_CARE_PROVIDER_SITE_OTHER): Payer: Medicare Other | Admitting: Family Medicine

## 2022-09-23 VITALS — BP 138/84 | HR 117 | Temp 98.2°F | Ht 63.0 in | Wt 234.0 lb

## 2022-09-23 DIAGNOSIS — R000 Tachycardia, unspecified: Secondary | ICD-10-CM | POA: Diagnosis not present

## 2022-09-23 DIAGNOSIS — Z1152 Encounter for screening for COVID-19: Secondary | ICD-10-CM | POA: Diagnosis not present

## 2022-09-23 DIAGNOSIS — I10 Essential (primary) hypertension: Secondary | ICD-10-CM

## 2022-09-23 MED ORDER — MOLNUPIRAVIR EUA 200MG CAPSULE: 4.0000 | ORAL_CAPSULE | Freq: Two times a day (BID) | ORAL | 0 refills | Status: AC

## 2022-09-23 MED ORDER — BENZONATATE 200 MG PO CAPS: 200.0000 mg | ORAL_CAPSULE | Freq: Two times a day (BID) | ORAL | 0 refills | Status: DC | PRN

## 2022-09-23 NOTE — Patient Instructions (Addendum)
It was a pleasure meeting you today. Thank you for allowing me to take part in your health care.  Our goals for today as we discussed include:  Symptomatic management for fever, muscle aches and headaches  Start Molnupiravir 2 capsules two times a day for 5 days -Tylenol 325-500 mg every 6 hours as needed -Ibuprofen 200 mg every 8 hours as needed  Stay well hydrated Rest as needed with frequent repositioning and ambulation.  Increase activity as soon as tolerated to help with recovery.  Continue wearing masks, hand washing and self isolation until symptom free.  If you have worsening symptoms, especially difficulty breathing please call 911 or have someone take you to the emergency department.    If you have any questions or concerns, please do not hesitate to call the office at [phone].  I look forward to our next visit and until then take care and stay safe.  Regards,   Carollee Leitz, MD   [facility]

## 2022-09-23 NOTE — Progress Notes (Signed)
SUBJECTIVE:   Chief Complaint  Patient presents with   Acute Visit    Tested positive for covid on 09/22/22 No smell / taste Coughing x 1 day    HPI  Patient presents to clinic for COVID-like symptoms. She reported symptoms started last night.  Her husband has same symptoms.  Home COVID test positive.  Endorses mild shortness of breath and intermittent use of albuterol inhaler.  Associated with nasal congestion, loss of smell and taste, vomited x 1 nonbilious nonbloody emesis.  Denies any headaches, fevers, chest pain, difficulty breathing, nausea, abdominal pain, or diarrhea.  Hydrating well.  Appetite has decreased a little.  Positive tobacco use.  Would like antiviral therapy.  PERTINENT PMH / PSH: Hypertension OSA on CPAP Tobacco use Obesity class III   OBJECTIVE:  BP 138/84   Pulse (!) 117   Temp 98.2 F (36.8 C)   Ht '5\' 3"'$  (1.6 m)   Wt 234 lb (106.1 kg)   SpO2 99%   BMI 41.45 kg/m    Physical Exam Vitals reviewed.  Constitutional:      General: She is not in acute distress.    Appearance: Normal appearance. She is obese. She is not ill-appearing, toxic-appearing or diaphoretic.  HENT:     Right Ear: Tympanic membrane, ear canal and external ear normal. There is no impacted cerumen.     Left Ear: Tympanic membrane, ear canal and external ear normal. There is no impacted cerumen.     Nose: Congestion present. No rhinorrhea.     Mouth/Throat:     Mouth: Mucous membranes are moist.     Pharynx: Oropharynx is clear. No oropharyngeal exudate or posterior oropharyngeal erythema.  Eyes:     General:        Right eye: No discharge.        Left eye: No discharge.     Conjunctiva/sclera: Conjunctivae normal.  Cardiovascular:     Rate and Rhythm: Regular rhythm. Tachycardia present.     Pulses: Normal pulses.     Heart sounds: Normal heart sounds.  Pulmonary:     Effort: Pulmonary effort is normal.     Breath sounds: Normal breath sounds. No wheezing.  Chest:      Chest wall: No tenderness.  Musculoskeletal:        General: Normal range of motion.     Right lower leg: No edema.     Left lower leg: No edema.  Lymphadenopathy:     Cervical: No cervical adenopathy.  Skin:    General: Skin is warm and dry.  Neurological:     General: No focal deficit present.     Mental Status: She is alert and oriented to person, place, and time. Mental status is at baseline.  Psychiatric:        Mood and Affect: Mood normal.        Behavior: Behavior normal.        Thought Content: Thought content normal.        Judgment: Judgment normal.     ASSESSMENT/PLAN:  Encounter for screening for COVID-19 Assessment & Plan: Acute.  In no acute respiratory distress.  Tachycardic without hypoxia.  Able to speak in full sentences without shortness of breath.  Lungs clear on exam. COVID positive Symptom management Strict COVID and return precautions provided   Orders: -     POC COVID-19 BinaxNow -     Respiratory virus panel -     POCT Influenza A/B -  molnupiravir EUA; Take 4 capsules (800 mg total) by mouth 2 (two) times daily for 5 days.  Dispense: 40 capsule; Refill: 0 -     Benzonatate; Take 1 capsule (200 mg total) by mouth 2 (two) times daily as needed for cough.  Dispense: 20 capsule; Refill: 0  Essential hypertension Assessment & Plan: Chronic.  Likely elevated given viral respiratory infection Continue amlodipine 5 mg daily   Tachycardia Assessment & Plan: Asymptomatic.  Likely secondary to COVID infection and increased use of albuterol therapy. Strict return precautions provided.      PDMP reviewed  Return if symptoms worsen or fail to improve.  Carollee Leitz, MD

## 2022-09-24 DIAGNOSIS — G4733 Obstructive sleep apnea (adult) (pediatric): Secondary | ICD-10-CM | POA: Diagnosis not present

## 2022-09-26 LAB — RESPIRATORY VIRUS PANEL

## 2022-09-30 NOTE — Unmapped (Signed)
[facility] Refill Coordination Note    Specialty Medication(s) to be Shipped:   CF/Pulmonary/Asthma: Xolair 150mg /ml    Other medication(s) to be shipped: No additional medications requested for fill at this time     Renee Cunningham, DOB: 13-Jul-1966  Phone: [phone] (home) [phone] (work)      All above HIPAA information was verified with patient.     Was a Nurse, learning disability used for this call? No    Completed refill call assessment today to schedule patient's medication shipment from the [facility] [phone]).  All relevant notes have been reviewed.     Specialty medication(s) and dose(s) confirmed: Regimen is correct and unchanged.   Changes to medications: Renee Cunningham reports no changes at this time.  Changes to insurance: No  New side effects reported not previously addressed with a pharmacist or physician: None reported  Questions for the pharmacist: No    Confirmed patient received a Conservation officer, historic buildings and a Surveyor, mining with first shipment. The patient will receive a drug information handout for each medication shipped and additional FDA Medication Guides as required.       DISEASE/MEDICATION-SPECIFIC INFORMATION        For patients on injectable medications: Patient currently has 0 doses left.  Next injection is scheduled for 09/30/22.    SPECIALTY MEDICATION ADHERENCE     Medication Adherence    Patient reported X missed doses in the last month: 0  Specialty Medication: XOLAIR 150 mg/mL syringe (omalizumab)  Patient is on additional specialty medications: No  Patient is on more than two specialty medications: No  Any gaps in refill history greater than 2 weeks in the last 3 months: no  Demonstrates understanding of importance of adherence: yes  Informant: patient  Reliability of informant: reliable  Provider-estimated medication adherence level: good  Patient is at risk for Non-Adherence: No  Reasons for non-adherence: no problems identified Were doses missed due to medication being on hold? No    XOLAIR 150 mg/mL syringe (omalizumab)  : 0 days of medicine on hand        REFERRAL TO PHARMACIST     Referral to the pharmacist: Not needed      [facility]     Shipping address confirmed in Epic.     Delivery Scheduled: Yes, Expected medication delivery date: 10/02/22.     Medication will be delivered via UPS to the prescription address in Epic WAM.    Renee Cunningham Shared [facility] Specialty Technician

## 2022-10-01 MED FILL — XOLAIR 150 MG/ML SUBCUTANEOUS SYRINGE: SUBCUTANEOUS | 21 days supply | Qty: 2 | Fill #4

## 2022-10-06 HISTORY — PX: OTHER SURGICAL HISTORY: SHX169

## 2022-10-09 DIAGNOSIS — G5601 Carpal tunnel syndrome, right upper limb: Secondary | ICD-10-CM | POA: Diagnosis not present

## 2022-10-10 ENCOUNTER — Encounter: Payer: Self-pay | Admitting: Family Medicine

## 2022-10-10 DIAGNOSIS — Z1152 Encounter for screening for COVID-19: Secondary | ICD-10-CM | POA: Insufficient documentation

## 2022-10-10 DIAGNOSIS — R000 Tachycardia, unspecified: Secondary | ICD-10-CM | POA: Insufficient documentation

## 2022-10-10 HISTORY — DX: Tachycardia, unspecified: R00.0

## 2022-10-10 HISTORY — DX: Encounter for screening for COVID-19: Z11.52

## 2022-10-10 NOTE — Assessment & Plan Note (Signed)
Acute.  In no acute respiratory distress.  Tachycardic without hypoxia.  Able to speak in full sentences without shortness of breath.  Lungs clear on exam. COVID positive Symptom management Strict COVID and return precautions provided

## 2022-10-10 NOTE — Assessment & Plan Note (Signed)
Asymptomatic.  Likely secondary to COVID infection and increased use of albuterol therapy. Strict return precautions provided.

## 2022-10-10 NOTE — Assessment & Plan Note (Signed)
Chronic.  Likely elevated given viral respiratory infection Continue amlodipine 5 mg daily

## 2022-10-15 NOTE — Unmapped (Signed)
[facility] Refill Coordination Note    Specialty Medication(s) to be Shipped:   CF/Pulmonary/Asthma: Xolair 150mg /ml    Other medication(s) to be shipped: No additional medications requested for fill at this time     Renee Cunningham, DOB: 06-Feb-1966  Phone: [phone] (home) [phone] (work)      All above HIPAA information was verified with patient.     Was a Nurse, learning disability used for this call? No    Completed refill call assessment today to schedule patient's medication shipment from the [facility] [phone]).  All relevant notes have been reviewed.     Specialty medication(s) and dose(s) confirmed: Regimen is correct and unchanged.   Changes to medications: Renee Cunningham reports no changes at this time.  Changes to insurance: No  New side effects reported not previously addressed with a pharmacist or physician: None reported  Questions for the pharmacist: No    Confirmed patient received a Conservation officer, historic buildings and a Surveyor, mining with first shipment. The patient will receive a drug information handout for each medication shipped and additional FDA Medication Guides as required.       DISEASE/MEDICATION-SPECIFIC INFORMATION        For patients on injectable medications: Patient currently has 0 doses left.  Next injection is scheduled for 10/15/22.    SPECIALTY MEDICATION ADHERENCE     Medication Adherence    Patient reported X missed doses in the last month: 0  Specialty Medication: XOLAIR 150 mg/mL syringe (omalizumab)                                Were doses missed due to medication being on hold? No    XOLAIR 150 mg/mL syringe (omalizumab)  : 0 days of medicine on hand       REFERRAL TO PHARMACIST     Referral to the pharmacist: Not needed      [facility]     Shipping address confirmed in Epic.     Delivery Scheduled: Yes, Expected medication delivery date: 10/17/22.     Medication will be delivered via UPS to the prescription address in Epic WAM.    Renee Cunningham Shared [facility] Specialty Technician

## 2022-10-16 ENCOUNTER — Ambulatory Visit (INDEPENDENT_AMBULATORY_CARE_PROVIDER_SITE_OTHER): Payer: 59 | Admitting: Family Medicine

## 2022-10-16 ENCOUNTER — Encounter: Payer: Self-pay | Admitting: Family Medicine

## 2022-10-16 VITALS — BP 128/80 | HR 85 | Temp 99.2°F | Ht 63.0 in | Wt 227.6 lb

## 2022-10-16 DIAGNOSIS — M170 Bilateral primary osteoarthritis of knee: Secondary | ICD-10-CM

## 2022-10-16 DIAGNOSIS — R7989 Other specified abnormal findings of blood chemistry: Secondary | ICD-10-CM

## 2022-10-16 DIAGNOSIS — N1831 Chronic kidney disease, stage 3a: Secondary | ICD-10-CM | POA: Diagnosis not present

## 2022-10-16 DIAGNOSIS — I10 Essential (primary) hypertension: Secondary | ICD-10-CM | POA: Diagnosis not present

## 2022-10-16 DIAGNOSIS — I890 Lymphedema, not elsewhere classified: Secondary | ICD-10-CM | POA: Diagnosis not present

## 2022-10-16 DIAGNOSIS — Z87898 Personal history of other specified conditions: Secondary | ICD-10-CM | POA: Diagnosis not present

## 2022-10-16 NOTE — Patient Instructions (Addendum)
It was a pleasure meeting you today. Thank you for allowing me to take part in your health care.  Our goals for today as we discussed include:  Prescription for compression stockings sent  Stop Meloxicam  If you are taking Ozempic stop Victoza  If you are not on the Ozempic continue the Victoza  Will get some labs today  Please bring in ALL of your medications to your next visit.   If you have any questions or concerns, please do not hesitate to call the office at [phone].  I look forward to our next visit and until then take care and stay safe.  Regards,   Carollee Leitz, MD   [facility]

## 2022-10-16 NOTE — Progress Notes (Signed)
SUBJECTIVE:   Chief Complaint  Patient presents with   Acute Visit    Right knee swelling Patient would like a prescription for compression stockings   HPI Patient presents to clinic today requesting prescription for compression stockings.  Chronic lymphedema History of bilateral lower extremity lymphadenopathy follows with vascular surgery.  Uses lymph pump daily and reports has helped with the swelling.  Also takes Lasix 20 mg daily as needed.  She went to get her new compression stockings however requires a prescription for above-the-knee stockings.  She also endorses right knee pain for which she has been evaluated with orthopedics in [place] and was attributed to osteoarthritis/bursitis of the right knee and was started on meloxicam.  She has had cortisone injections in the past.   PERTINENT PMH / PSH: Chronic lymphedema Osteoarthritis Obesity class III Prediabetes Hypertension OSA GERD Hyperlipidemia  OBJECTIVE:  BP 128/80   Pulse 85   Temp 99.2 F (37.3 C)   Ht '5\' 3"'$  (1.6 m)   Wt 227 lb 9.6 oz (103.2 kg)   SpO2 99%   BMI 40.32 kg/m    Physical Exam Musculoskeletal:     Right upper leg: Swelling and edema present. No deformity or tenderness.     Left upper leg: Swelling and edema present. No deformity or tenderness.     Right knee: Swelling present. No deformity.     Left knee: Swelling present.     Right lower leg: No tenderness. 1+ Edema present.     Left lower leg: No tenderness. 1+ Edema present.     Right ankle: Normal. No deformity. Normal range of motion.     Left ankle: Normal. No deformity. Normal range of motion.     Right foot: No swelling.     Left foot: No swelling.     ASSESSMENT/PLAN:  Stage 3a chronic kidney disease ([place]) Assessment & Plan: Chronic.  Stable.  Slight increase in recent creatinine.  Likely secondary to NSAID use Discontinue NSAIDs Recheck creatinine  Orders: -     Microalbumin / creatinine urine ratio -     CBC  with Differential/Platelet; Future -     Comprehensive metabolic panel; Future  Lymphedema Assessment & Plan: Chronic.  Stable.  Follows with vascular surgery.  Improved with lymph pumps and compression stockings Prescription for above-the-knee compression stockings given to patient Continue to follow with vascular surgery as scheduled   Orders: -     For home use only DME Other see comment  High serum vitamin B12 Assessment & Plan: Chronic.  Recent serum B12 greater than 2000. Recommend discontinuing any B12 or vitamin B complex supplements Repeat B12 levels today.  Orders: -     Vitamin B12  Arthritis of both knees Assessment & Plan: Chronic.  Stable.  Follows with orthopedics.  Currently taking meloxicam which also may be contributing to increased swelling in bilateral lower extremities Continue to follow with orthopedics Discontinue meloxicam secondary to slight increase in recent creatinine. Recheck creatinine levels      Essential hypertension Assessment & Plan: Chronic.  Stable. Continue amlodipine 5 mg daily Could consider switching medication given chronic lymphedema Request patient bring all medications at her TOC visit   History of prediabetes Assessment & Plan: Could not find A1c greater than 6.5 in epic or Care Everywhere.  Recent A1c 5.2 Patient currently reports taking Victoza and Ozempic, not sure of dosing. Called pharmacy appears patient picked up prescription of Victoza.  Requested patient bring in all medications at next  visit and will review at that time.   Recommend patient bring in all her medications at next visit for review.  HCM Kidney evaluation due  PDMP reviewed  Return in about 1 week (around 10/23/2022) for PCP, medication review.  Carollee Leitz, MD

## 2022-10-17 LAB — MICROALBUMIN / CREATININE URINE RATIO
Creatinine,U: 91.6 mg/dL
Microalb Creat Ratio: 0.8 mg/g (ref 0.0–30.0)
Microalb, Ur: <0.7 mg/dL (ref 0.0–1.9)

## 2022-10-17 LAB — VITAMIN B12: Vitamin B-12: >1500 pg/mL — ABNORMAL HIGH (ref 211–911)

## 2022-10-17 MED FILL — XOLAIR 150 MG/ML SUBCUTANEOUS SYRINGE: SUBCUTANEOUS | 21 days supply | Qty: 2 | Fill #5

## 2022-10-18 ENCOUNTER — Encounter: Payer: Self-pay | Admitting: Family Medicine

## 2022-10-22 ENCOUNTER — Ambulatory Visit: Payer: 59 | Admitting: Family Medicine

## 2022-10-23 ENCOUNTER — Ambulatory Visit: Payer: 59 | Admitting: Family Medicine

## 2022-10-23 DIAGNOSIS — Z79891 Long term (current) use of opiate analgesic: Secondary | ICD-10-CM | POA: Diagnosis not present

## 2022-10-23 DIAGNOSIS — M25569 Pain in unspecified knee: Secondary | ICD-10-CM | POA: Diagnosis not present

## 2022-10-23 DIAGNOSIS — G894 Chronic pain syndrome: Secondary | ICD-10-CM | POA: Diagnosis not present

## 2022-10-23 DIAGNOSIS — R030 Elevated blood-pressure reading, without diagnosis of hypertension: Secondary | ICD-10-CM | POA: Diagnosis not present

## 2022-10-23 DIAGNOSIS — Z5181 Encounter for therapeutic drug level monitoring: Secondary | ICD-10-CM | POA: Diagnosis not present

## 2022-10-23 DIAGNOSIS — M5451 Vertebrogenic low back pain: Secondary | ICD-10-CM | POA: Diagnosis not present

## 2022-10-25 ENCOUNTER — Encounter: Payer: Self-pay | Admitting: Family Medicine

## 2022-10-25 DIAGNOSIS — R7989 Other specified abnormal findings of blood chemistry: Secondary | ICD-10-CM | POA: Insufficient documentation

## 2022-10-25 NOTE — Assessment & Plan Note (Signed)
Chronic.  Stable.  Follows with vascular surgery.  Improved with lymph pumps and compression stockings Prescription for above-the-knee compression stockings given to patient Continue to follow with vascular surgery as scheduled

## 2022-10-25 NOTE — Assessment & Plan Note (Addendum)
Could not find A1c greater than 6.5 in epic or Care Everywhere.  Recent A1c 5.2 Patient currently reports taking Victoza and Ozempic, not sure of dosing. Called pharmacy appears patient picked up prescription of Victoza.  Requested patient bring in all medications at next visit and will review at that time.

## 2022-10-25 NOTE — Assessment & Plan Note (Addendum)
Chronic.  Recent serum B12 greater than 2000. Recommend discontinuing any B12 or vitamin B complex supplements Repeat B12 levels today.

## 2022-10-25 NOTE — Assessment & Plan Note (Addendum)
Chronic.  Stable.  Follows with orthopedics.  Currently taking meloxicam which also may be contributing to increased swelling in bilateral lower extremities Continue to follow with orthopedics Discontinue meloxicam secondary to slight increase in recent creatinine. Recheck creatinine levels

## 2022-10-25 NOTE — Assessment & Plan Note (Signed)
Chronic.  Stable. Continue amlodipine 5 mg daily Could consider switching medication given chronic lymphedema Request patient bring all medications at her [facility] visit

## 2022-10-25 NOTE — Assessment & Plan Note (Addendum)
Chronic.  Stable.  Slight increase in recent creatinine.  Likely secondary to NSAID use Discontinue NSAIDs Recheck creatinine

## 2022-10-29 ENCOUNTER — Ambulatory Visit: Payer: 59 | Admitting: Family Medicine

## 2022-10-29 DIAGNOSIS — M25561 Pain in right knee: Secondary | ICD-10-CM | POA: Diagnosis not present

## 2022-10-29 DIAGNOSIS — Z79899 Other long term (current) drug therapy: Secondary | ICD-10-CM | POA: Diagnosis not present

## 2022-10-29 DIAGNOSIS — M1711 Unilateral primary osteoarthritis, right knee: Secondary | ICD-10-CM | POA: Diagnosis not present

## 2022-10-29 DIAGNOSIS — R262 Difficulty in walking, not elsewhere classified: Secondary | ICD-10-CM | POA: Diagnosis not present

## 2022-10-29 DIAGNOSIS — E559 Vitamin D deficiency, unspecified: Secondary | ICD-10-CM | POA: Diagnosis not present

## 2022-10-30 ENCOUNTER — Ambulatory Visit: Payer: 59 | Admitting: Nurse Practitioner

## 2022-10-30 DIAGNOSIS — M25661 Stiffness of right knee, not elsewhere classified: Secondary | ICD-10-CM | POA: Diagnosis not present

## 2022-10-30 DIAGNOSIS — M25561 Pain in right knee: Secondary | ICD-10-CM | POA: Diagnosis not present

## 2022-10-30 DIAGNOSIS — M1711 Unilateral primary osteoarthritis, right knee: Secondary | ICD-10-CM | POA: Diagnosis not present

## 2022-10-30 DIAGNOSIS — R262 Difficulty in walking, not elsewhere classified: Secondary | ICD-10-CM | POA: Diagnosis not present

## 2022-10-30 NOTE — Progress Notes (Deleted)
Tomasita Morrow, NP-C Phone: [phone]  Heather Sweeney is a 57 y.o. female who presents today for ***  ***  Social History   Tobacco Use  Smoking Status Never  Smokeless Tobacco Current   Types: Chew    Current Outpatient Medications on File Prior to Visit  Medication Sig Dispense Refill   ACCU-CHEK GUIDE test strip Use as instructed 100 strip 3   albuterol (VENTOLIN HFA) 108 (90 Base) MCG/ACT inhaler Inhale 1-2 puffs into the lungs every 6 (six) hours as needed for wheezing or shortness of breath. 18 g 11   amLODipine (NORVASC) 5 MG tablet Take 1 tablet (5 mg total) by mouth daily. 90 tablet 3   benzonatate (TESSALON) 200 MG capsule Take 1 capsule (200 mg total) by mouth 2 (two) times daily as needed for cough. 20 capsule 0   benztropine (COGENTIN) 1 MG tablet Take 1 mg by mouth 2 (two) times daily.     Blood Glucose Monitoring Suppl (ACCU-CHEK GUIDE ME) w/Device KIT Use as directed,once daily to test blood sugar 1 kit 0   buPROPion (WELLBUTRIN XL) 150 MG 24 hr tablet Take 150 mg by mouth every morning.     celecoxib (CELEBREX) 200 MG capsule Take 1 capsule by mouth 2 (two) times daily as needed (pain).     cetirizine (ZYRTEC ALLERGY) 10 MG tablet Take 2 tablets (20 mg total) by mouth daily. 180 tablet 3   chlorhexidine (PERIDEX) 0.12 % solution RINSE WITH 5ML BY MOUTH AS NEEDED     EASY COMFORT PEN NEEDLES 31G X 8 MM MISC INJECT VICTOZA ONCE DAILY AS DIRECTED 100 each 3   EPINEPHrine 0.3 mg/0.3 mL IJ SOAJ injection Inject 0.3 mg into the muscle as needed for anaphylaxis. 2 each 3   famotidine (PEPCID) 40 MG tablet Take 1 tablet (40 mg total) by mouth daily. 90 tablet 3   fexofenadine (ALLEGRA) 180 MG tablet Take 2 tablets (360 mg total) by mouth every morning. Prn only if needed either take zyrtec or allegra explain to patient 180 tablet 3   FLUoxetine (PROZAC) 40 MG capsule Take 40 mg by mouth daily.     fluPHENAZine (PROLIXIN) 2.5 MG tablet Take 2.5 mg by mouth 2 (two) times  daily.     fluticasone (FLONASE) 50 MCG/ACT nasal spray Place 2 sprays into both nostrils daily. 48 g 3   furosemide (LASIX) 20 MG tablet Take 0.5-1 tablets (10-20 mg total) by mouth daily as needed. (Patient taking differently: Take 20 mg by mouth daily as needed.) 90 tablet 1   hydrocortisone 2.5 % ointment Apply topically 2 (two) times daily. Neck 30 g 5   hydrOXYzine (ATARAX) 50 MG tablet Take 50 mg by mouth 4 (four) times daily as needed for anxiety.     Insulin Pen Needle (NOVOFINE) 30G X 8 MM MISC Use with victoza daily 1.8 mg 90 each 3   liraglutide (VICTOZA) 18 MG/3ML SOPN Inject 1.8 mg into the skin daily. 9 mL 11   Melatonin 10 MG TABS      meloxicam (MOBIC) 15 MG tablet 15 mg by oral route.     metoprolol succinate (TOPROL-XL) 25 MG 24 hr tablet Take 1 tablet (25 mg total) by mouth daily. 90 tablet 3   montelukast (SINGULAIR) 10 MG tablet Take 1 tablet (10 mg total) by mouth daily. 90 tablet 3   Multiple Vitamins-Minerals (ALIVE MULTI-VITAMIN PO) Take by mouth daily in the afternoon.     mupirocin ointment (BACTROBAN) 2 % Apply  1 Application topically 2 (two) times daily. Left leg wound 30 g 0   nicotine (NICODERM CQ - DOSED IN MG/24 HOURS) 14 mg/24hr patch Place 1 patch (14 mg total) onto the skin daily. X 45 days then go down to 7 mg patch 45 patch 0   nicotine (NICODERM CQ - DOSED IN MG/24 HOURS) 14 mg/24hr patch Place 1 patch (14 mg total) onto the skin daily. 28 patch 0   nicotine (NICODERM CQ - DOSED IN MG/24 HOURS) 21 mg/24hr patch Place 1 patch (21 mg total) onto the skin daily. 28 patch 0   nicotine (NICODERM CQ - DOSED IN MG/24 HR) 7 mg/24hr patch Place 1 patch (7 mg total) onto the skin daily. After using 14 mg patch 45 patch 0   nicotine (NICODERM CQ - DOSED IN MG/24 HR) 7 mg/24hr patch Place 1 patch (7 mg total) onto the skin daily. 28 patch 0   omalizumab (XOLAIR) 150 MG/ML prefilled syringe Inject 300 mg into the skin every 28 (twenty-eight) days.     omalizumab Arvid Right)  150 MG/ML prefilled syringe 300 mg by sub-q route.     omeprazole (PRILOSEC) 40 MG capsule Take 1 capsule (40 mg total) by mouth daily. 90 capsule 3   oxyCODONE-acetaminophen (PERCOCET) 10-325 MG tablet Take 1 tablet by mouth 3 (three) times daily as needed for pain.     OZEMPIC, 0.25 OR 0.5 MG/DOSE, 2 MG/3ML SOPN INJECT 0.'25MG'$  SUBCUTANEOUSLY ONCE A WEEK     QUEtiapine (SEROQUEL XR) 400 MG 24 hr tablet Take 400 mg by mouth at bedtime.     scopolamine (TRANSDERM-SCOP) 1 MG/3DAYS Place 1 patch (1.5 mg total) onto the skin every 3 (three) days. 4 hours before you get on boat 4 patch 0   topiramate (TOPAMAX) 100 MG tablet Take 100 mg by mouth at bedtime.     valACYclovir (VALTREX) 1000 MG tablet Take 1 tablet (1,000 mg total) by mouth 2 (two) times daily. for 7 days prn 90 tablet 3   vitamin B-12 (CYANOCOBALAMIN) 1000 MCG tablet Take 1,000 mcg by mouth daily.     No current facility-administered medications on file prior to visit.     ROS see history of present illness  Objective  Physical Exam There were no vitals filed for this visit.  BP Readings from Last 3 Encounters:  10/16/22 128/80  09/23/22 138/84  08/25/22 (!) 147/88   Wt Readings from Last 3 Encounters:  10/16/22 227 lb 9.6 oz (103.2 kg)  09/23/22 234 lb (106.1 kg)  08/25/22 222 lb 12.8 oz (101.1 kg)    Physical Exam   Assessment/Plan: Please see individual problem list.  There are no diagnoses linked to this encounter.   Health Maintenance: ***  No follow-ups on file.   Tomasita Morrow, NP-C [place]

## 2022-11-03 NOTE — Progress Notes (Deleted)
Heather Morrow, NP-C Phone: [phone]  Heather Sweeney is a 57 y.o. female who presents today for ***  HYPERTENSION Disease Monitoring: Blood pressure range-*** Chest pain- ***      Dyspnea- *** Medications: Compliance- *** Lightheadedness- ***   Edema- ***  Lab Results  Component Value Date   NA 139 05/17/2022   K 3.5 05/17/2022   CO2 25 05/17/2022   GLUCOSE 89 05/17/2022   BUN 11 05/17/2022   CREATININE 1.06 (H) 05/17/2022   CALCIUM 8.9 05/17/2022   EGFR 45 (L) 03/04/2022   GFRNONAA >60 05/17/2022    DIABETES Disease Monitoring: Blood Sugar ranges-*** Polyuria/phagia/dipsia- ***      Optho- *** Medications: Compliance- *** Hypoglycemic symptoms- ***  Lab Results  Component Value Date   HGBA1C 5.2 07/10/2022     HYPERLIPIDEMIA Disease Monitoring: See symptoms for Hypertension Medications: Compliance- *** Right upper quadrant pain- ***  Muscle aches- ***  Lab Results  Component Value Date   CHOL 160 12/12/2021   HDL 51.50 12/12/2021   LDLCALC 86 12/12/2021   TRIG 111.0 12/12/2021   CHOLHDL 3 12/12/2021      Social History   Tobacco Use  Smoking Status Never  Smokeless Tobacco Current   Types: Chew    Current Outpatient Medications on File Prior to Visit  Medication Sig Dispense Refill   ACCU-CHEK GUIDE test strip Use as instructed 100 strip 3   albuterol (VENTOLIN HFA) 108 (90 Base) MCG/ACT inhaler Inhale 1-2 puffs into the lungs every 6 (six) hours as needed for wheezing or shortness of breath. 18 g 11   amLODipine (NORVASC) 5 MG tablet Take 1 tablet (5 mg total) by mouth daily. 90 tablet 3   benzonatate (TESSALON) 200 MG capsule Take 1 capsule (200 mg total) by mouth 2 (two) times daily as needed for cough. 20 capsule 0   benztropine (COGENTIN) 1 MG tablet Take 1 mg by mouth 2 (two) times daily.     Blood Glucose Monitoring Suppl (ACCU-CHEK GUIDE ME) w/Device KIT Use as directed,once daily to test blood sugar 1 kit 0   buPROPion (WELLBUTRIN XL)  150 MG 24 hr tablet Take 150 mg by mouth every morning.     celecoxib (CELEBREX) 200 MG capsule Take 1 capsule by mouth 2 (two) times daily as needed (pain).     cetirizine (ZYRTEC ALLERGY) 10 MG tablet Take 2 tablets (20 mg total) by mouth daily. 180 tablet 3   chlorhexidine (PERIDEX) 0.12 % solution RINSE WITH 5ML BY MOUTH AS NEEDED     EASY COMFORT PEN NEEDLES 31G X 8 MM MISC INJECT VICTOZA ONCE DAILY AS DIRECTED 100 each 3   EPINEPHrine 0.3 mg/0.3 mL IJ SOAJ injection Inject 0.3 mg into the muscle as needed for anaphylaxis. 2 each 3   famotidine (PEPCID) 40 MG tablet Take 1 tablet (40 mg total) by mouth daily. 90 tablet 3   fexofenadine (ALLEGRA) 180 MG tablet Take 2 tablets (360 mg total) by mouth every morning. Prn only if needed either take zyrtec or allegra explain to patient 180 tablet 3   FLUoxetine (PROZAC) 40 MG capsule Take 40 mg by mouth daily.     fluPHENAZine (PROLIXIN) 2.5 MG tablet Take 2.5 mg by mouth 2 (two) times daily.     fluticasone (FLONASE) 50 MCG/ACT nasal spray Place 2 sprays into both nostrils daily. 48 g 3   furosemide (LASIX) 20 MG tablet Take 0.5-1 tablets (10-20 mg total) by mouth daily as needed. (Patient taking differently: Take  20 mg by mouth daily as needed.) 90 tablet 1   hydrocortisone 2.5 % ointment Apply topically 2 (two) times daily. Neck 30 g 5   hydrOXYzine (ATARAX) 50 MG tablet Take 50 mg by mouth 4 (four) times daily as needed for anxiety.     Insulin Pen Needle (NOVOFINE) 30G X 8 MM MISC Use with victoza daily 1.8 mg 90 each 3   liraglutide (VICTOZA) 18 MG/3ML SOPN Inject 1.8 mg into the skin daily. 9 mL 11   Melatonin 10 MG TABS      meloxicam (MOBIC) 15 MG tablet 15 mg by oral route.     metoprolol succinate (TOPROL-XL) 25 MG 24 hr tablet Take 1 tablet (25 mg total) by mouth daily. 90 tablet 3   montelukast (SINGULAIR) 10 MG tablet Take 1 tablet (10 mg total) by mouth daily. 90 tablet 3   Multiple Vitamins-Minerals (ALIVE MULTI-VITAMIN PO) Take by  mouth daily in the afternoon.     mupirocin ointment (BACTROBAN) 2 % Apply 1 Application topically 2 (two) times daily. Left leg wound 30 g 0   nicotine (NICODERM CQ - DOSED IN MG/24 HOURS) 14 mg/24hr patch Place 1 patch (14 mg total) onto the skin daily. X 45 days then go down to 7 mg patch 45 patch 0   nicotine (NICODERM CQ - DOSED IN MG/24 HOURS) 14 mg/24hr patch Place 1 patch (14 mg total) onto the skin daily. 28 patch 0   nicotine (NICODERM CQ - DOSED IN MG/24 HOURS) 21 mg/24hr patch Place 1 patch (21 mg total) onto the skin daily. 28 patch 0   nicotine (NICODERM CQ - DOSED IN MG/24 HR) 7 mg/24hr patch Place 1 patch (7 mg total) onto the skin daily. After using 14 mg patch 45 patch 0   nicotine (NICODERM CQ - DOSED IN MG/24 HR) 7 mg/24hr patch Place 1 patch (7 mg total) onto the skin daily. 28 patch 0   omalizumab (XOLAIR) 150 MG/ML prefilled syringe Inject 300 mg into the skin every 28 (twenty-eight) days.     omalizumab Arvid Right) 150 MG/ML prefilled syringe 300 mg by sub-q route.     omeprazole (PRILOSEC) 40 MG capsule Take 1 capsule (40 mg total) by mouth daily. 90 capsule 3   oxyCODONE-acetaminophen (PERCOCET) 10-325 MG tablet Take 1 tablet by mouth 3 (three) times daily as needed for pain.     OZEMPIC, 0.25 OR 0.5 MG/DOSE, 2 MG/3ML SOPN INJECT 0.'25MG'$  SUBCUTANEOUSLY ONCE A WEEK     QUEtiapine (SEROQUEL XR) 400 MG 24 hr tablet Take 400 mg by mouth at bedtime.     scopolamine (TRANSDERM-SCOP) 1 MG/3DAYS Place 1 patch (1.5 mg total) onto the skin every 3 (three) days. 4 hours before you get on boat 4 patch 0   topiramate (TOPAMAX) 100 MG tablet Take 100 mg by mouth at bedtime.     valACYclovir (VALTREX) 1000 MG tablet Take 1 tablet (1,000 mg total) by mouth 2 (two) times daily. for 7 days prn 90 tablet 3   vitamin B-12 (CYANOCOBALAMIN) 1000 MCG tablet Take 1,000 mcg by mouth daily.     No current facility-administered medications on file prior to visit.     ROS see history of present  illness  Objective  Physical Exam There were no vitals filed for this visit.  BP Readings from Last 3 Encounters:  10/16/22 128/80  09/23/22 138/84  08/25/22 (!) 147/88   Wt Readings from Last 3 Encounters:  10/16/22 227 lb 9.6 oz (103.2 kg)  09/23/22 234 lb (106.1 kg)  08/25/22 222 lb 12.8 oz (101.1 kg)    Physical Exam   Assessment/Plan: Please see individual problem list.  There are no diagnoses linked to this encounter.   Health Maintenance: ***  No follow-ups on file.   Heather Morrow, NP-C [place]

## 2022-11-04 ENCOUNTER — Ambulatory Visit: Payer: 59 | Admitting: Nurse Practitioner

## 2022-11-04 ENCOUNTER — Telehealth: Payer: Self-pay | Admitting: Internal Medicine

## 2022-11-04 DIAGNOSIS — I10 Essential (primary) hypertension: Secondary | ICD-10-CM

## 2022-11-04 DIAGNOSIS — E785 Hyperlipidemia, unspecified: Secondary | ICD-10-CM

## 2022-11-04 DIAGNOSIS — N1831 Chronic kidney disease, stage 3a: Secondary | ICD-10-CM

## 2022-11-04 DIAGNOSIS — Z87898 Personal history of other specified conditions: Secondary | ICD-10-CM

## 2022-11-04 NOTE — Telephone Encounter (Signed)
No show letter mailed and sent vis my chart.

## 2022-11-04 NOTE — Telephone Encounter (Signed)
Pt has no showed three times in a row

## 2022-11-05 ENCOUNTER — Ambulatory Visit (INDEPENDENT_AMBULATORY_CARE_PROVIDER_SITE_OTHER): Payer: 59 | Admitting: Family

## 2022-11-05 ENCOUNTER — Encounter: Payer: Self-pay | Admitting: Family

## 2022-11-05 VITALS — BP 138/82 | HR 78 | Temp 98.0°F | Ht 63.0 in | Wt 231.6 lb

## 2022-11-05 DIAGNOSIS — F17221 Nicotine dependence, chewing tobacco, in remission: Secondary | ICD-10-CM | POA: Diagnosis not present

## 2022-11-05 DIAGNOSIS — Z87891 Personal history of nicotine dependence: Secondary | ICD-10-CM | POA: Diagnosis not present

## 2022-11-05 DIAGNOSIS — L509 Urticaria, unspecified: Secondary | ICD-10-CM

## 2022-11-05 MED ORDER — NICOTINE 14 MG/24HR TD PT24: 14.0000 mg | MEDICATED_PATCH | Freq: Every day | TRANSDERMAL | 0 refills | Status: DC

## 2022-11-05 NOTE — Assessment & Plan Note (Signed)
She is working on smoking cessation.  I provided her a refill of nicotine patch 14 mg as requested.  Of note, medications reconciled.  Medication list is up-to-date at this time.  Printed for her so she may review at home.  She will follow-up with PCP as scheduled in March

## 2022-11-05 NOTE — Telephone Encounter (Signed)
Patient came into the office today requesting an appointment . She was scheduled with [facility]. However, I asked the patient if she received the the no show letter that was sent. She stated no. I informed the patient that she has had several no shows and due to her having several no shows if she has another she will be dismissed from the practice. Patient stated she did not know that ,but voiced she understood.

## 2022-11-05 NOTE — Progress Notes (Signed)
Assessment & Plan:  Smoking history Assessment & Plan: She is working on smoking cessation.  I provided her a refill of nicotine patch 14 mg as requested.  Of note, medications reconciled.  Medication list is up-to-date at this time.  Printed for her so she may review at home.  She will follow-up with PCP as scheduled in March   Chewing tobacco nicotine dependence in remission -     Nicotine; Place 1 patch (14 mg total) onto the skin daily.  Dispense: 28 patch; Refill: 0  Urticaria, unspecified     Return precautions given.   Risks, benefits, and alternatives of the medications and treatment plan prescribed today were discussed, and patient expressed understanding.   Education regarding symptom management and diagnosis given to patient on AVS either electronically or printed.  No follow-ups on file.  Mable Paris, FNP  Subjective:    Patient ID: [name], female    DOB: 05/27/1966, 57 y.o.   MRN: [id number]  CC: BRYLEE BERK is a 57 y.o. female who presents today for follow up.   HPI: Here today as she was told to come in and bring medications with her for review.  She has no complaints.  She feels well today     She has all her medications with her in clinic.  She is working on smoking cessation and requests refill of nicotine patch '14mg'$ .   Allergies: Cozaar [losartan], Keflex [cephalexin], Penicillins, Zestril [lisinopril], Hctz [hydrochlorothiazide], Bee venom, and Metformin and related Current Outpatient Medications on File Prior to Visit  Medication Sig Dispense Refill   ACCU-CHEK GUIDE test strip Use as instructed 100 strip 3   albuterol (VENTOLIN HFA) 108 (90 Base) MCG/ACT inhaler Inhale 1-2 puffs into the lungs every 6 (six) hours as needed for wheezing or shortness of breath. 18 g 11   amLODipine (NORVASC) 5 MG tablet Take 1 tablet (5 mg total) by mouth daily. 90 tablet 3   benztropine (COGENTIN) 1 MG tablet Take 1 mg by mouth 2 (two) times daily.      Blood Glucose Monitoring Suppl (ACCU-CHEK GUIDE ME) w/Device KIT Use as directed,once daily to test blood sugar 1 kit 0   buPROPion (WELLBUTRIN XL) 150 MG 24 hr tablet Take 150 mg by mouth every morning.     cetirizine (ZYRTEC ALLERGY) 10 MG tablet Take 2 tablets (20 mg total) by mouth daily. 180 tablet 3   chlorhexidine (PERIDEX) 0.12 % solution RINSE WITH 5ML BY MOUTH AS NEEDED     EASY COMFORT PEN NEEDLES 31G X 8 MM MISC INJECT VICTOZA ONCE DAILY AS DIRECTED 100 each 3   EPINEPHrine 0.3 mg/0.3 mL IJ SOAJ injection Inject 0.3 mg into the muscle as needed for anaphylaxis. 2 each 3   famotidine (PEPCID) 40 MG tablet Take 1 tablet (40 mg total) by mouth daily. 90 tablet 3   fexofenadine (ALLEGRA) 180 MG tablet Take 2 tablets (360 mg total) by mouth every morning. Prn only if needed either take zyrtec or allegra explain to patient 180 tablet 3   FLUoxetine (PROZAC) 40 MG capsule Take 40 mg by mouth daily.     fluPHENAZine (PROLIXIN) 2.5 MG tablet Take 2.5 mg by mouth 2 (two) times daily.     fluticasone (FLONASE) 50 MCG/ACT nasal spray Place 2 sprays into both nostrils daily. 48 g 3   hydrocortisone 2.5 % ointment Apply topically 2 (two) times daily. Neck 30 g 5   hydrOXYzine (ATARAX) 50 MG tablet Take 50  mg by mouth 4 (four) times daily as needed for anxiety.     Insulin Pen Needle (NOVOFINE) 30G X 8 MM MISC Use with victoza daily 1.8 mg 90 each 3   liraglutide (VICTOZA) 18 MG/3ML SOPN Inject 1.8 mg into the skin daily. 9 mL 11   Melatonin 10 MG TABS      metoprolol succinate (TOPROL-XL) 25 MG 24 hr tablet Take 1 tablet (25 mg total) by mouth daily. 90 tablet 3   montelukast (SINGULAIR) 10 MG tablet Take 1 tablet (10 mg total) by mouth daily. 90 tablet 3   Multiple Vitamins-Minerals (ALIVE MULTI-VITAMIN PO) Take by mouth daily in the afternoon.     omalizumab Arvid Right) 150 MG/ML prefilled syringe 300 mg by sub-q route.     omeprazole (PRILOSEC) 40 MG capsule Take 1 capsule (40 mg total) by  mouth daily. 90 capsule 3   oxyCODONE-acetaminophen (PERCOCET) 10-325 MG tablet Take 1 tablet by mouth 3 (three) times daily as needed for pain.     QUEtiapine (SEROQUEL XR) 400 MG 24 hr tablet Take 400 mg by mouth at bedtime.     scopolamine (TRANSDERM-SCOP) 1 MG/3DAYS Place 1 patch (1.5 mg total) onto the skin every 3 (three) days. 4 hours before you get on boat 4 patch 0   topiramate (TOPAMAX) 100 MG tablet Take 100 mg by mouth at bedtime.     valACYclovir (VALTREX) 1000 MG tablet Take 1 tablet (1,000 mg total) by mouth 2 (two) times daily. for 7 days prn 90 tablet 3   vitamin B-12 (CYANOCOBALAMIN) 1000 MCG tablet Take 1,000 mcg by mouth daily.     omalizumab Arvid Right) 150 MG/ML prefilled syringe Inject 300 mg into the skin every 28 (twenty-eight) days.     No current facility-administered medications on file prior to visit.    Review of Systems  Constitutional:  Negative for chills and fever.  Respiratory:  Negative for cough.   Cardiovascular:  Negative for chest pain and palpitations.  Gastrointestinal:  Negative for nausea and vomiting.      Objective:    BP 138/82   Pulse 78   Temp 98 F (36.7 C) (Oral)   Ht '5\' 3"'$  (1.6 m)   Wt 231 lb 9.6 oz (105.1 kg)   SpO2 98%   BMI 41.03 kg/m  BP Readings from Last 3 Encounters:  11/05/22 138/82  10/16/22 128/80  09/23/22 138/84   Wt Readings from Last 3 Encounters:  11/05/22 231 lb 9.6 oz (105.1 kg)  10/16/22 227 lb 9.6 oz (103.2 kg)  09/23/22 234 lb (106.1 kg)    Physical Exam Vitals reviewed.  Constitutional:      Appearance: She is well-developed.  Eyes:     Conjunctiva/sclera: Conjunctivae normal.  Cardiovascular:     Rate and Rhythm: Normal rate and regular rhythm.     Pulses: Normal pulses.     Heart sounds: Normal heart sounds.  Pulmonary:     Effort: Pulmonary effort is normal.     Breath sounds: Normal breath sounds. No wheezing, rhonchi or rales.  Skin:    General: Skin is warm and dry.  Neurological:      Mental Status: She is alert.  Psychiatric:        Speech: Speech normal.        Behavior: Behavior normal.        Thought Content: Thought content normal.

## 2022-11-05 NOTE — Patient Instructions (Addendum)
You can pick up refill of valtrex as you have refills.   Thank you for bringing medications in today.  Medications have been reconciled.  Please see updated medication list

## 2022-11-14 NOTE — Unmapped (Signed)
The [facility] has made a third and final attempt to reach this patient to refill the following medication:XOLAIR 150 mg/mL syringe (omalizumab).      We have left voicemail with husband at the following phone numbers: [phone], have been unable to leave messages on the following phone numbers: [phone] and [phone], and have sent a text message to the following phone numbers: [phone] .    Dates contacted: 01/26     02/02    02/08  Last scheduled delivery: 10/17/22    The patient may be at risk of non-compliance with this medication. The patient should call the [facility] at [phone]  Option 4, then Option 2 (all other specialty patients) to refill medication.    Brylan Dec' W Alixander Rallis   [facility] Specialty Technician

## 2022-11-26 ENCOUNTER — Telehealth: Payer: Self-pay

## 2022-11-26 NOTE — Telephone Encounter (Signed)
Patient called to confirm/inquire about upcoming appt date/time/location.  Advised:  Provider Department Encounter # Center  12/02/2022 [id number] AM Copland, Deirdre Evener, PA-C [facility]   Patient inquiring if she will be getting breast exam and pap smear. Advised she is scheduled for annual a breast exam will be performed and she is due for pap smear, so that will be collected as well. Mammogram will also be ordered. Location verified with patient by address [place].

## 2022-11-27 DIAGNOSIS — F17210 Nicotine dependence, cigarettes, uncomplicated: Secondary | ICD-10-CM | POA: Insufficient documentation

## 2022-11-27 DIAGNOSIS — R5383 Other fatigue: Secondary | ICD-10-CM | POA: Insufficient documentation

## 2022-11-27 DIAGNOSIS — F209 Schizophrenia, unspecified: Secondary | ICD-10-CM | POA: Insufficient documentation

## 2022-12-01 NOTE — Progress Notes (Unsigned)
PCP: Patient, No Pcp Per   No chief complaint on file.   HPI:      Ms. Heather Sweeney is a 57 y.o. [id number] whose LMP was No LMP recorded. Patient has had a hysterectomy., presents today for her annual examination.  Her menses are absent due to hyst She {does:18564} have vasomotor sx.   Sex activity: {sex active: 315163}. She {does:18564} have vaginal dryness.  Last Pap: 11/29/19 Results were: no abnormalities /neg HPV DNA.  Hx of STDs: {STD hx:14358}  Last mammogram: 08/05/21 Results were: normal--routine follow-up in 12 months There is no FH of breast cancer. There is no FH of ovarian cancer. The patient {does:18564} do self-breast exams.  Colonoscopy: 04/15/19 at [place]; Repeat due after 10 years.   Tobacco use: {tob:20664} Alcohol use: {Alcohol:11675} No drug use Exercise: {exercise:31265}  She {does:18564} get adequate calcium and Vitamin D in her diet.  Labs with PCP.   Patient Active Problem List   Diagnosis Date Noted   Smoking history 11/05/2022   High serum vitamin B12 10/25/2022   Lymphedema 08/24/2022   Panniculitis 04/22/2022   Stage 3a chronic kidney disease ([place]) 03/16/2022   Chronic idiopathic urticaria 12/12/2021   Intertrigo 02/12/2021   Insomnia 10/11/2020   Arthritis of both knees 06/08/2020   Gastroesophageal reflux disease 04/20/2020   Mitral valve insufficiency 01/10/2020   Vitamin D deficiency 02/01/2019   Mood disorder ([place]) 12/24/2018   HLD (hyperlipidemia) 09/01/2018   History of prediabetes 09/01/2018   Chronic pain 08/31/2018   Allergic rhinitis 08/31/2018   Lumbosacral radiculitis 02/19/2018   Angioedema 07/07/2017   Bilateral carpal tunnel syndrome 11/03/2016   Morbid obesity with BMI of 40.0-44.9, adult ([place]) 08/07/2016   Essential hypertension 07/10/2016   OSA on CPAP 07/10/2016    Past Surgical History:  Procedure Laterality Date   ABDOMINAL HYSTERECTOMY  07/08/2011   CARPAL TUNNEL RELEASE     right hand 1996    CESAREAN SECTION     2012   CHOLECYSTECTOMY N/A 01/12/2018   Procedure: LAPAROSCOPIC CHOLECYSTECTOMY WITH INTRAOPERATIVE CHOLANGIOGRAM ERAS PATHWAY;  Surgeon: Johnathan Hausen, MD;  Location: [facility];  Service: General;  Laterality: N/A;   COLONOSCOPY WITH PROPOFOL N/A 04/15/2019   Procedure: COLONOSCOPY WITH PROPOFOL;  Surgeon: Jonathon Bellows, MD;  Location: [facility] END[facility]COPY;  Service: Gastroenterology;  Laterality: N/A;   DILATION AND CURETTAGE OF UTERUS     04/2011 benign endometrial polyp squamous metaplasia, inflamm, blood, mucous, benign    ESOPHAGOGASTRODUODENOSCOPY (EGD) WITH PROPOFOL N/A 10/23/2020   Procedure: ESOPHAGOGASTRODUODENOSCOPY (EGD) WITH PROPOFOL;  Surgeon: Jonathon Bellows, MD;  Location: [facility] ENDOSCOPY;  Service: Gastroenterology;  Laterality: N/A;   FLEXIBLE SIGMOIDOSCOPY N/A 02/15/2021   Procedure: FLEXIBLE SIGMOIDOSCOPY;  Surgeon: Jonathon Bellows, MD;  Location: [facility] ENDOSCOPY;  Service: Gastroenterology;  Laterality: N/A;   FOOT SURGERY     Dr. Prudence Davidson    LEEP     2006   PANNICULECTOMY N/A 04/24/2022   Procedure: INFRAUMBILICAL PANNICULECTOMY;  Surgeon: Wallace Going, DO;  Location: [place];  Service: Plastics;  Laterality: N/A;   Right hand carpal tunnel release Right    TUBAL LIGATION      Family History  Problem Relation Age of Onset   Heart failure Mother    Hypertension Mother    Early death Mother    Heart disease Mother        chf   Cancer Father        esophageal    Hypertension Father    Breast  cancer Sister 92   Cancer Sister        breast   Depression Sister    Mental illness Sister    Asthma Son    Cancer Other        brain cancer   Cancer Paternal Aunt        brain cancer    Social History   Socioeconomic History   Marital status: Married    Spouse name: Not on file   Number of children: Not on file   Years of education: Not on file   Highest education level: Not on file  Occupational History   Not on file  Tobacco Use   Smoking status:  Never   Smokeless tobacco: Current    Types: Chew  Vaping Use   Vaping Use: Never used  Substance and Sexual Activity   Alcohol use: No    Alcohol/week: 0.0 standard drinks of alcohol   Drug use: No   Sexual activity: Not Currently    Birth control/protection: Surgical    Comment: Hysterectomy  Other Topics Concern   Not on file  Social History Narrative   Married    No guns    Wears seat belt    Safe in relationship    Never smoker    2 sons Gaspar Bidding ~10 and another son ~28 as of 10/2019   1 granson eli 1 y.o as of 10/2019   2 sisters    Unemployed       Social Determinants of Health   Financial Resource Strain: Low Risk  (06/19/2022)   Overall Financial Resource Strain (CARDIA)    Difficulty of Paying Living Expenses: Not hard at all  Food Insecurity: No Food Insecurity (06/19/2022)   Hunger Vital Sign    Worried About Running Out of Food in the Last Year: Never true    Ran Out of Food in the Last Year: Never true  Transportation Needs: No Transportation Needs (06/19/2022)   PRAPARE - Hydrologist (Medical): No    Lack of Transportation (Non-Medical): No  Physical Activity: Insufficiently Active (07/07/2019)   Exercise Vital Sign    Days of Exercise per Week: 3 days    Minutes of Exercise per Session: 30 min  Stress: No Stress Concern Present (06/19/2022)   [place]    Feeling of Stress : Not at all  Social Connections: Unknown (06/19/2022)   Social Connection and Isolation Panel [NHANES]    Frequency of Communication with Friends and Family: More than three times a week    Frequency of Social Gatherings with Friends and Family: More than three times a week    Attends Religious Services: Not on file    Active Member of [place] or Organizations: Not on file    Attends Archivist Meetings: Not on file    Marital Status: Married  Intimate Partner Violence: Not At Risk  (06/19/2022)   Humiliation, Afraid, Rape, and Kick questionnaire    Fear of Current or Ex-Partner: No    Emotionally Abused: No    Physically Abused: No    Sexually Abused: No     Current Outpatient Medications:    ACCU-CHEK GUIDE test strip, Use as instructed, Disp: 100 strip, Rfl: 3   albuterol (VENTOLIN HFA) 108 (90 Base) MCG/ACT inhaler, Inhale 1-2 puffs into the lungs every 6 (six) hours as needed for wheezing or shortness of breath., Disp: 18 g, Rfl: 11  amLODipine (NORVASC) 5 MG tablet, Take 1 tablet (5 mg total) by mouth daily., Disp: 90 tablet, Rfl: 3   benztropine (COGENTIN) 1 MG tablet, Take 1 mg by mouth 2 (two) times daily., Disp: , Rfl:    Blood Glucose Monitoring Suppl (ACCU-CHEK GUIDE ME) w/Device KIT, Use as directed,once daily to test blood sugar, Disp: 1 kit, Rfl: 0   buPROPion (WELLBUTRIN XL) 150 MG 24 hr tablet, Take 150 mg by mouth every morning., Disp: , Rfl:    cetirizine (ZYRTEC ALLERGY) 10 MG tablet, Take 2 tablets (20 mg total) by mouth daily., Disp: 180 tablet, Rfl: 3   chlorhexidine (PERIDEX) 0.12 % solution, RINSE WITH 5ML BY MOUTH AS NEEDED, Disp: , Rfl:    EASY COMFORT PEN NEEDLES 31G X 8 MM MISC, INJECT VICTOZA ONCE DAILY AS DIRECTED, Disp: 100 each, Rfl: 3   EPINEPHrine 0.3 mg/0.3 mL IJ SOAJ injection, Inject 0.3 mg into the muscle as needed for anaphylaxis., Disp: 2 each, Rfl: 3   famotidine (PEPCID) 40 MG tablet, Take 1 tablet (40 mg total) by mouth daily., Disp: 90 tablet, Rfl: 3   fexofenadine (ALLEGRA) 180 MG tablet, Take 2 tablets (360 mg total) by mouth every morning. Prn only if needed either take zyrtec or allegra explain to patient, Disp: 180 tablet, Rfl: 3   FLUoxetine (PROZAC) 40 MG capsule, Take 40 mg by mouth daily., Disp: , Rfl:    fluPHENAZine (PROLIXIN) 2.5 MG tablet, Take 2.5 mg by mouth 2 (two) times daily., Disp: , Rfl:    fluticasone (FLONASE) 50 MCG/ACT nasal spray, Place 2 sprays into both nostrils daily., Disp: 48 g, Rfl: 3    hydrocortisone 2.5 % ointment, Apply topically 2 (two) times daily. Neck, Disp: 30 g, Rfl: 5   hydrOXYzine (ATARAX) 50 MG tablet, Take 50 mg by mouth 4 (four) times daily as needed for anxiety., Disp: , Rfl:    Insulin Pen Needle (NOVOFINE) 30G X 8 MM MISC, Use with victoza daily 1.8 mg, Disp: 90 each, Rfl: 3   liraglutide (VICTOZA) 18 MG/3ML SOPN, Inject 1.8 mg into the skin daily., Disp: 9 mL, Rfl: 11   Melatonin 10 MG TABS, , Disp: , Rfl:    metoprolol succinate (TOPROL-XL) 25 MG 24 hr tablet, Take 1 tablet (25 mg total) by mouth daily., Disp: 90 tablet, Rfl: 3   montelukast (SINGULAIR) 10 MG tablet, Take 1 tablet (10 mg total) by mouth daily., Disp: 90 tablet, Rfl: 3   Multiple Vitamins-Minerals (ALIVE MULTI-VITAMIN PO), Take by mouth daily in the afternoon., Disp: , Rfl:    nicotine (NICODERM CQ - DOSED IN MG/24 HOURS) 14 mg/24hr patch, Place 1 patch (14 mg total) onto the skin daily., Disp: 28 patch, Rfl: 0   omalizumab (XOLAIR) 150 MG/ML prefilled syringe, Inject 300 mg into the skin every 28 (twenty-eight) days., Disp: , Rfl:    omalizumab (XOLAIR) 150 MG/ML prefilled syringe, 300 mg by sub-q route., Disp: , Rfl:    omeprazole (PRILOSEC) 40 MG capsule, Take 1 capsule (40 mg total) by mouth daily., Disp: 90 capsule, Rfl: 3   oxyCODONE-acetaminophen (PERCOCET) 10-325 MG tablet, Take 1 tablet by mouth 3 (three) times daily as needed for pain., Disp: , Rfl:    QUEtiapine (SEROQUEL XR) 400 MG 24 hr tablet, Take 400 mg by mouth at bedtime., Disp: , Rfl:    scopolamine (TRANSDERM-SCOP) 1 MG/3DAYS, Place 1 patch (1.5 mg total) onto the skin every 3 (three) days. 4 hours before you get on boat, Disp:  4 patch, Rfl: 0   topiramate (TOPAMAX) 100 MG tablet, Take 100 mg by mouth at bedtime., Disp: , Rfl:    valACYclovir (VALTREX) 1000 MG tablet, Take 1 tablet (1,000 mg total) by mouth 2 (two) times daily. for 7 days prn, Disp: 90 tablet, Rfl: 3   vitamin B-12 (CYANOCOBALAMIN) 1000 MCG tablet, Take 1,000  mcg by mouth daily., Disp: , Rfl:      ROS:  Review of Systems BREAST: No symptoms    Objective: There were no vitals taken for this visit.   OBGyn Exam  Results: No results found for this or any previous visit (from the past 24 hour(s)).  Assessment/Plan:  No diagnosis found.   No orders of the defined types were placed in this encounter.           GYN counsel {counseling: 16159}    F/U  No follow-ups on file.  Vernadine Coombs B. Chizuko Trine, PA-C 12/01/2022 7:37 PM

## 2022-12-02 ENCOUNTER — Ambulatory Visit (INDEPENDENT_AMBULATORY_CARE_PROVIDER_SITE_OTHER): Payer: 59 | Admitting: Obstetrics and Gynecology

## 2022-12-02 ENCOUNTER — Other Ambulatory Visit (HOSPITAL_COMMUNITY)
Admission: RE | Admit: 2022-12-02 | Discharge: 2022-12-02 | Disposition: A | Discharge status: Home / Self Care | Payer: 59 | Source: Ambulatory Visit | Attending: Obstetrics and Gynecology | Admitting: Obstetrics and Gynecology

## 2022-12-02 ENCOUNTER — Encounter: Payer: Self-pay | Admitting: Obstetrics and Gynecology

## 2022-12-02 VITALS — BP 138/80 | Ht 63.0 in | Wt 240.0 lb

## 2022-12-02 DIAGNOSIS — Z1272 Encounter for screening for malignant neoplasm of vagina: Secondary | ICD-10-CM | POA: Insufficient documentation

## 2022-12-02 DIAGNOSIS — Z1151 Encounter for screening for human papillomavirus (HPV): Secondary | ICD-10-CM | POA: Diagnosis present

## 2022-12-02 DIAGNOSIS — N898 Other specified noninflammatory disorders of vagina: Secondary | ICD-10-CM

## 2022-12-02 DIAGNOSIS — Z1231 Encounter for screening mammogram for malignant neoplasm of breast: Secondary | ICD-10-CM

## 2022-12-02 DIAGNOSIS — F3176 Bipolar disorder, in full remission, most recent episode depressed: Secondary | ICD-10-CM | POA: Insufficient documentation

## 2022-12-02 DIAGNOSIS — Z01419 Encounter for gynecological examination (general) (routine) without abnormal findings: Secondary | ICD-10-CM | POA: Diagnosis not present

## 2022-12-02 DIAGNOSIS — Z01411 Encounter for gynecological examination (general) (routine) with abnormal findings: Secondary | ICD-10-CM

## 2022-12-02 LAB — POCT WET PREP WITH KOH
Clue Cells Wet Prep HPF POC: NEGATIVE
KOH Prep POC: NEGATIVE
Trichomonas, UA: NEGATIVE
Yeast Wet Prep HPF POC: NEGATIVE

## 2022-12-02 MED ORDER — METRONIDAZOLE 500 MG PO TABS: ORAL_TABLET | ORAL | 0 refills | Status: DC

## 2022-12-02 NOTE — Patient Instructions (Signed)
I value your feedback and you entrusting us with your care. If you get a [place] patient survey, I would appreciate you taking the time to let us know about your experience today. Thank you!  [facility]: [phone]      

## 2022-12-02 NOTE — Unmapped (Signed)
[facility] Refill Coordination Note    Specialty Medication(s) to be Shipped:   CF/Pulmonary/Asthma: Xolair 150mg /ml    Other medication(s) to be shipped: No additional medications requested for fill at this time     Renee Cunningham, DOB: 12-03-1965  Phone: [phone] (home) [phone] (work)      All above HIPAA information was verified with patient.     Was a Nurse, learning disability used for this call? No    Completed refill call assessment today to schedule patient's medication shipment from the [facility] [phone]).  All relevant notes have been reviewed.     Specialty medication(s) and dose(s) confirmed: Regimen is correct and unchanged.   Changes to medications: Kalicia reports no changes at this time.  Changes to insurance: No  New side effects reported not previously addressed with a pharmacist or physician: None reported  Questions for the pharmacist: No    Confirmed patient received a Conservation officer, historic buildings and a Surveyor, mining with first shipment. The patient will receive a drug information handout for each medication shipped and additional FDA Medication Guides as required.       DISEASE/MEDICATION-SPECIFIC INFORMATION        For patients on injectable medications: Patient currently has 0 doses left.  Next injection is scheduled for 12/01/22.    SPECIALTY MEDICATION ADHERENCE     Medication Adherence    Patient reported X missed doses in the last month: 1  Specialty Medication: XOLAIR 150 mg/mL syringe (omalizumab)  Patient is on additional specialty medications: No  Informant: patient     Were doses missed due to medication being on hold? No    XOLAIR 150 mg/mL syringe (omalizumab)  : 0 days of medicine on hand       REFERRAL TO PHARMACIST     Referral to the pharmacist: Not needed      [facility]     Shipping address confirmed in Epic.     Delivery Scheduled: Yes, Expected medication delivery date: 12/03/22.     Medication will be delivered via UPS to the prescription address in Epic [place].    Wyatt Mage M Elisabeth Cara   [facility] Specialty Technician

## 2022-12-03 LAB — CYTOLOGY - PAP
Comment: NEGATIVE
Diagnosis: NEGATIVE
High risk HPV: NEGATIVE

## 2022-12-03 MED FILL — XOLAIR 150 MG/ML SUBCUTANEOUS SYRINGE: SUBCUTANEOUS | 21 days supply | Qty: 2 | Fill #6

## 2022-12-10 ENCOUNTER — Telehealth: Payer: Self-pay

## 2022-12-10 DIAGNOSIS — N898 Other specified noninflammatory disorders of vagina: Secondary | ICD-10-CM

## 2022-12-10 NOTE — Telephone Encounter (Signed)
We don't manage this. Pt needs to f/u with PCP or can call [facility] (doesn't need referral) for eval/mgmt.

## 2022-12-10 NOTE — Telephone Encounter (Signed)
Was able to reach patient on cell, she is requesting Oxycodone-Acetaminophen be refilled,when I inquired what medication was for? She stated that she saw Elmo Putt Copland on [id number] and at visit she states that she was wearing a brace and had expressed to physician that she was having inflammation and pain in legs. Patient is requesting that we send in a prescription for Oxycodone, muscle relaxer and a prescription for compression stockings to [facility] on [address]. Please review chart and advise. KW

## 2022-12-10 NOTE — Telephone Encounter (Signed)
Pt aware. Transferred to FD to schedule nurse visit.

## 2022-12-10 NOTE — Telephone Encounter (Signed)
Pt aware. She said she apologizes, she made an error in calling us instead of calling her pain management doctor. She also said she was glad I called her back because as of this morning she is having a little vaginal odor. She completed flagyl but the odor has returned as of this morning. Denies vaginal discharge and itching. She wants to know if you can refill flagyl or monitor sx?

## 2022-12-10 NOTE — Telephone Encounter (Signed)
Patient contact office after hours line last night 12/09/22 requesting refill on pain medication and pressure stockings. Attempted to reach patient to get clarification in regards to which medication patient is requesting and voicemail box was full. In chart it is historically listed Oxycodone-Acetaminophen date on 05/31/20. KW

## 2022-12-10 NOTE — Telephone Encounter (Signed)
Have pt come in for self swab for nuswab vaginitis since sx returned so quickly. Order placed. Had neg wet prep on exam so want to make sure we're treating correctly.

## 2022-12-12 ENCOUNTER — Other Ambulatory Visit (HOSPITAL_COMMUNITY)
Admission: RE | Admit: 2022-12-12 | Discharge: 2022-12-12 | Disposition: A | Discharge status: Home / Self Care | Payer: 59 | Source: Ambulatory Visit | Attending: Obstetrics and Gynecology | Admitting: Obstetrics and Gynecology

## 2022-12-12 ENCOUNTER — Ambulatory Visit (INDEPENDENT_AMBULATORY_CARE_PROVIDER_SITE_OTHER): Payer: 59

## 2022-12-12 VITALS — BP 145/77 | HR 73 | Ht 63.0 in | Wt 231.9 lb

## 2022-12-12 DIAGNOSIS — N898 Other specified noninflammatory disorders of vagina: Secondary | ICD-10-CM | POA: Insufficient documentation

## 2022-12-12 NOTE — Progress Notes (Signed)
    NURSE VISIT NOTE  Subjective:    Patient ID: Heather Sweeney, female    DOB: [birth year], 57 y.o.   MRN: [id number]  HPI  Patient is a 57 y.o. [id number] female who presents for watery vaginal discharge for 3 day(s). Denies abnormal vaginal bleeding or significant pelvic pain or fever. denies dysuria, urinary frequency, urinary urgency, flank pain, abdominal pain, pelvic pain, cloudy malordorous urine, genital rash, and genital irritation. Patient denies history of known exposure to STD.   Objective:    BP (!) 145/77   Pulse 73   Ht 5\' 3"  (1.6 m)   Wt 231 lb 14.4 oz (105.2 kg)   BMI 41.08 kg/m      Assessment:   No diagnosis found.  nonspecific vaginitis  Plan:   Cervicovaginal swab sent to lab. Treatment: abstain from coitus during course of treatment ROV prn if symptoms persist or worsen.   Minette Headland, CMA

## 2022-12-12 NOTE — Patient Instructions (Signed)
Vaginitis  Vaginitis is irritation and swelling of the vagina. Treatment will depend on the cause. What are the causes? It can be caused by: Bacteria. Yeast. A parasite. A virus. Low hormone levels. Bubble baths, scented tampons, and feminine sprays. Other things can change the balance of the yeast and bacteria that live in the vagina. These include: Antibiotic medicines. Not being clean enough. Some birth control methods. Sex. Infection. Diabetes. A weakened body defense system (immune system). What increases the risk? Smoking or being around someone who smokes. Using washes (douches), scented tampons, or scented pads. Wearing tight pants or thong underwear. Using birth control pills or an IUD. Having sex without a condom or having a lot of partners. Having an STI. Using a certain product to kill sperm (nonoxynol-9). Eating foods that are high in sugar. Having diabetes. Having low levels of a female hormone. Having a weakened body defense system. Being pregnant or breastfeeding. What are the signs or symptoms? Fluid coming from the vagina that is not normal. A bad smell. Itching, pain, or swelling. Pain with sex. Pain or burning when you pee (urinate). Sometimes there are no symptoms. How is this treated? Treatment may include: Antibiotic creams or pills. Antifungal medicines. Medicines to ease symptoms if you have a virus. Your sex partner should also be treated. Estrogen medicines. Avoiding scented soaps, sprays, or douches. Stopping use of products that caused irritation and then using a cream to treat symptoms. Follow these instructions at home: Lifestyle Keep the area around your vagina clean and dry. Avoid using soap. Rinse the area with water. Until your doctor says it is okay: Do not use washes for the vagina. Do not use tampons. Do not have sex. Wipe from front to back after going to the bathroom. When your doctor says it is okay, practice safe sex  and use condoms. General instructions Take over-the-counter and prescription medicines only as told by your doctor. If you were prescribed an antibiotic medicine, take or use it as told by your doctor. Do not stop taking or using it even if you start to feel better. Keep all follow-up visits. How is this prevented? Do not use things that can irritate the vagina, such as fabric softeners. Avoid these products if they are scented: Sprays. Detergents. Tampons. Products for cleaning the vagina. Soaps or bubble baths. Let air reach your vagina. To do this: Wear cotton underwear. Do not wear: Underwear while you sleep. Tight pants. Thong underwear. Underwear or nylons without a cotton panel. Take off any wet clothing, such as bathing suits, as soon as you can. Practice safe sex and use condoms. Contact a doctor if: You have pain in your belly or in the area between your hips. You have a fever or chills. Your symptoms last for more than 2-3 days. Get help right away if: You have a fever and your symptoms get worse all of a sudden. Summary Vaginitis is irritation and swelling of the vagina. Treatment will depend on the cause of the condition. Do not use washes or tampons or have sex until your doctor says it is okay. This information is not intended to replace advice given to you by your health care provider. Make sure you discuss any questions you have with your health care provider. Document Revised: 03/22/2020 Document Reviewed: 03/22/2020 Elsevier Patient Education  2023 Elsevier Inc.  

## 2022-12-15 ENCOUNTER — Encounter: Payer: Self-pay | Admitting: Certified Nurse Midwife

## 2022-12-15 ENCOUNTER — Other Ambulatory Visit: Payer: Self-pay | Admitting: Certified Nurse Midwife

## 2022-12-15 LAB — CERVICOVAGINAL ANCILLARY ONLY
Bacterial Vaginitis (gardnerella): NEGATIVE
Candida Glabrata: POSITIVE — AB
Candida Vaginitis: POSITIVE — AB
Chlamydia: NEGATIVE
Comment: NEGATIVE
Comment: NEGATIVE
Comment: NEGATIVE
Comment: NEGATIVE
Comment: NEGATIVE
Comment: NORMAL
Neisseria Gonorrhea: NEGATIVE
Trichomonas: NEGATIVE

## 2022-12-15 MED ORDER — FLUCONAZOLE 150 MG PO TABS: 150.0000 mg | ORAL_TABLET | Freq: Once | ORAL | 0 refills | Status: AC

## 2022-12-19 ENCOUNTER — Ambulatory Visit
Admission: RE | Admit: 2022-12-19 | Discharge: 2022-12-19 | Disposition: A | Discharge status: Home / Self Care | Payer: 59 | Source: Ambulatory Visit | Attending: Obstetrics and Gynecology | Admitting: Obstetrics and Gynecology

## 2022-12-19 DIAGNOSIS — Z1231 Encounter for screening mammogram for malignant neoplasm of breast: Secondary | ICD-10-CM | POA: Insufficient documentation

## 2022-12-22 ENCOUNTER — Telehealth: Payer: Self-pay | Admitting: Family Medicine

## 2022-12-22 ENCOUNTER — Other Ambulatory Visit: Payer: Self-pay

## 2022-12-22 DIAGNOSIS — R7303 Prediabetes: Secondary | ICD-10-CM

## 2022-12-22 DIAGNOSIS — Z87898 Personal history of other specified conditions: Secondary | ICD-10-CM

## 2022-12-22 DIAGNOSIS — J309 Allergic rhinitis, unspecified: Secondary | ICD-10-CM

## 2022-12-22 DIAGNOSIS — I10 Essential (primary) hypertension: Secondary | ICD-10-CM

## 2022-12-22 MED ORDER — MONTELUKAST SODIUM 10 MG PO TABS: 10.0000 mg | ORAL_TABLET | Freq: Every day | ORAL | 3 refills | Status: DC

## 2022-12-22 MED ORDER — FLUTICASONE PROPIONATE 50 MCG/ACT NA SUSP: 2.0000 | Freq: Every day | NASAL | 3 refills | Status: DC

## 2022-12-22 MED ORDER — OMEPRAZOLE 40 MG PO CPDR: 40.0000 mg | DELAYED_RELEASE_CAPSULE | Freq: Every day | ORAL | 3 refills | Status: DC

## 2022-12-22 MED ORDER — AMLODIPINE BESYLATE 5 MG PO TABS: 5.0000 mg | ORAL_TABLET | Freq: Every day | ORAL | 3 refills | Status: DC

## 2022-12-22 MED ORDER — INSULIN PEN NEEDLE 30G X 8 MM MISC: 3 refills | Status: AC

## 2022-12-22 MED ORDER — FAMOTIDINE 40 MG PO TABS: 40.0000 mg | ORAL_TABLET | Freq: Every day | ORAL | 3 refills | Status: DC

## 2022-12-22 MED ORDER — VICTOZA 18 MG/3ML ~~LOC~~ SOPN: 1.8000 mg | PEN_INJECTOR | Freq: Every day | SUBCUTANEOUS | 11 refills | Status: DC

## 2022-12-22 NOTE — Telephone Encounter (Signed)
[facility] called in staying that med liraglutide ([place]) 18 MG/3ML SOPN its backordered, and they wants to know if provider can switch to another med? Any questions or concern, they can be reach [phone].

## 2022-12-23 NOTE — Telephone Encounter (Signed)
[facility] and informed the pharmacist that the patient has never seen this provider and the medication would be discussed at her appointment.  Otha Monical,cma

## 2023-01-01 NOTE — Unmapped (Unsigned)
Renee Cunningham is a 57 y.o. female with chronic idiopathic urticaria and angioedema who is here for follow up. Since her last appointment on 07/10/22, ***    The patient is tolerating medications  in the accompaniing list.  Current Outpatient Medications   Medication Sig Dispense Refill    ALCOHOL PADS PadM Use as directed,once daily to test blood sugar      amLODIPine (NORVASC) 5 MG tablet       benztropine (COGENTIN) 1 MG tablet Take 1 tablet (1 mg total) by mouth daily.      cetirizine (ZYRTEC) 10 MG tablet TAKE 2 TABLETS BY MOUTH ONCE DAILY AT NIGHT 60 tablet 0    clobetasoL (TEMOVATE) 0.05 % cream Apply 1-2 times a day to the raised itchy spots on your body as needed . 30 g 0    clotrimazole (LOTRIMIN) 1 % cream APPLY TOPICALLY IN THE MORNING AND AT BEDTIME      clotrimazole-betamethasone (LOTRISONE) 1-0.05 % cream APPLY EXTERNALLY TWICE DAILY AS NEEDED FOR SYMPTOMS FOR UP TO 2 WEEKS      clotrimazole-betamethasone (LOTRISONE) 1-0.05 % cream Apply externally BID prn sx up to 2 wks      empty container Misc Use as directed to dispose of Xolair syringes 1 each 3    EPINEPHrine (EPIPEN) 0.3 mg/0.3 mL injection Inject 0.3 mL (0.3 mg total) into the muscle once for 1 dose. May repeat in 5 minutes if symptoms are not improved or worsening. 1 each PRN    famotidine (PEPCID) 20 MG tablet 1 tablet (20 mg total) two (2) times a day.      ferrous sulfate 325 (65 FE) MG tablet Take 1 tablet (325 mg total) by mouth in the morning.      FLUoxetine (PROZAC) 10 MG capsule TAKE 1 CAPSULE BY MOUTH IN THE MORNING      fluPHENAZine (PROLIXIN) 2.5 MG tablet Take 1 tablet (2.5 mg total) by mouth daily.      fluticasone propionate (FLONASE) 50 mcg/actuation nasal spray Place 2 sprays into both nostrils daily.      hydrocortisone 2.5 % lotion Apply 1-2 times a day to the raised itchy spots as needed on your face 59 mL 0    hydrOXYchloroQUINE (PLAQUENIL) 200 mg tablet Take 1 tablet (200 mg total) by mouth Two (2) times a day. 60 tablet 12 hydrOXYzine (ATARAX) 25 MG tablet Take 1 tablet (25 mg total) by mouth two (2) times a day as needed for itching (hives). 60 tablet 3    melatonin 5 mg Tab Take 1 tablet (5 mg total) by mouth nightly.      metoprolol succinate (TOPROL-XL) 25 MG 24 hr tablet       montelukast (SINGULAIR) 10 mg tablet Take 1 tablet (10 mg total) by mouth nightly. 90 tablet 3    mupirocin (BACTROBAN) 2 % ointment       omalizumab (XOLAIR) 150 mg/mL syringe Inject the contents of 2 syringes (300 mg total) under the skin every twenty-one (21) days. 2 mL 12    ONETOUCH ULTRA TEST Strp Use as directed,once daily to test blood sugar      oxyCODONE-acetaminophen (PERCOCET) 10-325 mg per tablet TAKE 1 TABLET BY MOUTH THREE TIMES DAILY AS NEEDED FOR CHRONIC PAIN      topiramate (TOPAMAX) 100 MG tablet TAKE 1 & 1/2 (ONE & ONE-HALF) TABLETS BY MOUTH IN THE EVENING      triamcinolone (KENALOG) 0.1 % cream Apply topically to affected areas twice daily as  needed for rash. 454 g 2    triamcinolone (KENALOG) 0.1 % ointment Apply twice a day as needed only, only to areas with active hives breakouts. 80 g 6    VICTOZA 3-PAK 0.6 mg/0.1 mL (18 mg/3 mL) injection Inject 1.8 mg into the skin daily.       No current facility-administered medications for this visit.     Review of Systems:  (Unless otherwise designated above or below,  no additional significant complaint was elicited in any organ system below)  -------------------------------  As per HPI, otherwise review of 10 systems was negative.    The Family History is unchanged from previous visit.  The Past Medical and Surgical Histories are unchanged from previous visit.  The Social History is unchanged from previous visit.    Physical Exam:   There were no vitals filed for this visit.    General : No apparent distress. Awake, alert, well appearing.   HEENT: Normocephalic, atraumatic. No periorbital edema.   Neck: Neck is symmetrical with trachea midline.  Eyes: PERRL. Eyelids and conjunctiva normal bilaterally.   Respiratory: Breathing is unlabored, no tachypnea.  Cardiovascular: No edema, no pallor, no cyanosis.  Abdomen: Non-distended.  Skin: No gross wheals are noted on exam. There is hyperpigmentation on her anterior neck that are not wheals.  Extremities: Normal range of motion observed. No peripheral edema.  Neuro: Mood and behavior appropriate for age.  Musculoskeletal: Symmetric and appropriate movements of extremities.    05/17/22 CBC with diff with anemia, CMP wnl.    Impression:  No diagnosis found.    Renee Cunningham is a 57 y.o. female with CIU and angioedema who is improved since restarting Xolair, but has not been receiving Xolair doses on time.    Plan:    1. Urticaria and angioedema:     2. PCN Allergy:     3. Dispo: No follow-ups on file.    I personally spent *** minutes face-to-face and non-face-to-face in the care of this patient, which includes all pre, intra, and post visit time on the date of service.  All documented time was specific to the E/M visit and does not include any procedures that may have been performed.    ***ATTEST

## 2023-01-02 ENCOUNTER — Ambulatory Visit: Payer: 59 | Admitting: Plastic Surgery

## 2023-01-02 NOTE — Unmapped (Signed)
The [facility] has made a third and final attempt to reach this patient to refill the following medication:XOLAIR 150 mg/mL syringe (omalizumab).      We have been unable to leave messages on the following phone numbers: [phone] and have sent a text message to the following phone numbers: [phone] .    Dates contacted: 03/13   03/21   03/28  Last scheduled delivery: 12/03/22    The patient may be at risk of non-compliance with this medication. The patient should call the [facility] at [phone]  Option 4, then Option 2 (all other specialty patients) to refill medication.    Trayden Brandy' W Jovan Schickling   [facility] Specialty Technician

## 2023-01-30 DIAGNOSIS — G4733 Obstructive sleep apnea (adult) (pediatric): Principal | ICD-10-CM

## 2023-01-30 NOTE — Unmapped (Signed)
Called the patient to discuss Inspire criteria. I explained that [employer] requires a standard sleep study and will not accept a home sleep study. I recommended reaching out to her PCP for a referral for a new sleep study as she reports her last standard sleep study was over 7 years ago. We discussed that she would need to try and fail CPAP and there would need to be documentation of it. Also, I explained that based on the referral note, her BMI was 40.7 in 09/2022 and part of the Inspire criteria is a BMI of 32 or lower. The patient said she would reach out to her PCP for the standard sleep study referral. All questions were answered. Patient verbalized understanding.

## 2023-02-02 NOTE — Progress Notes (Deleted)
   SUBJECTIVE:  No chief complaint on file.  HPI ***  PERTINENT PMH / PSH: ***  OBJECTIVE:  There were no vitals taken for this visit.   Physical Exam  ASSESSMENT/PLAN:  There are no diagnoses linked to this encounter. PDMP reviewed***  No follow-ups on file.  Aaronmichael Brumbaugh, MD 

## 2023-02-03 ENCOUNTER — Encounter: Payer: 59 | Admitting: Family Medicine

## 2023-02-05 DIAGNOSIS — Z9181 History of falling: Secondary | ICD-10-CM | POA: Insufficient documentation

## 2023-02-06 ENCOUNTER — Ambulatory Visit
Admission: RE | Admit: 2023-02-06 | Discharge: 2023-02-06 | Disposition: A | Discharge status: Home / Self Care | Payer: 59 | Source: Ambulatory Visit | Attending: Infectious Diseases | Admitting: Infectious Diseases

## 2023-02-06 ENCOUNTER — Other Ambulatory Visit: Payer: Self-pay | Admitting: Infectious Diseases

## 2023-02-06 DIAGNOSIS — Z9181 History of falling: Secondary | ICD-10-CM

## 2023-02-09 NOTE — Unmapped (Signed)
Called the patient and reiterated that prior to scheduling an appointment with Dr. Gabriel Cirri, she would need to get a new standard sleep study since her last sleep study was over 7 years ago. I recommended getting a referral for a new standard sleep study from her PCP. I also explained that depending on her sleep study results, she would also have to try and fail CPAP as part of the [facility] eligibility criteria. She said she would contact her PCP for the sleep study referral. All questions were answered. Patient verbalized understanding.

## 2023-02-10 ENCOUNTER — Ambulatory Visit (INDEPENDENT_AMBULATORY_CARE_PROVIDER_SITE_OTHER): Payer: 59 | Admitting: Plastic Surgery

## 2023-02-10 ENCOUNTER — Other Ambulatory Visit: Payer: Self-pay | Admitting: Infectious Diseases

## 2023-02-10 ENCOUNTER — Encounter: Payer: Self-pay | Admitting: Plastic Surgery

## 2023-02-10 VITALS — BP 150/81 | HR 96 | Ht 63.0 in | Wt 251.2 lb

## 2023-02-10 DIAGNOSIS — Z9181 History of falling: Secondary | ICD-10-CM

## 2023-02-10 DIAGNOSIS — M793 Panniculitis, unspecified: Secondary | ICD-10-CM | POA: Diagnosis not present

## 2023-02-10 DIAGNOSIS — Z6837 Body mass index (BMI) 37.0-37.9, adult: Secondary | ICD-10-CM

## 2023-02-10 NOTE — Progress Notes (Signed)
   Subjective:    Patient ID: Heather Sweeney, female    DOB: 1966-06-03, 57 y.o.   MRN: [id number]  The patient is a 57 year old female here for evaluation of her abdomen.She is 5 feet 3 inches tall and weighs 251 pounds.  She has gained about 30 pounds since her her previous surgery.  She underwent a panniculectomy in July 2023.  She looks really good from that.  The scar is healed very nicely.  She would like more work done on her abdomen and may be the medial thigh area.  I do not feel any sign of a hernia but she is pretty tight of the abdomen that makes me think that she has gained weight internally.     Review of Systems  Constitutional: Negative.   HENT: Negative.    Eyes: Negative.   Respiratory: Negative.    Cardiovascular: Negative.   Gastrointestinal: Negative.   Endocrine: Negative.   Genitourinary: Negative.        Objective:   Physical Exam Vitals and nursing note reviewed.  Constitutional:      Appearance: Normal appearance.  HENT:     Head: Atraumatic.  Cardiovascular:     Rate and Rhythm: Normal rate.     Pulses: Normal pulses.  Pulmonary:     Effort: Pulmonary effort is normal.  Abdominal:     Palpations: Abdomen is soft.  Skin:    General: Skin is warm.     Capillary Refill: Capillary refill takes less than 2 seconds.     Coloration: Skin is not jaundiced.     Findings: No bruising.  Neurological:     Mental Status: She is alert and oriented to person, place, and time.  Psychiatric:        Mood and Affect: Mood normal.        Behavior: Behavior normal.        Thought Content: Thought content normal.        Judgment: Judgment normal.       Assessment & Plan:     ICD-10-CM   1. Class 2 severe obesity due to excess calories with serious comorbidity and body mass index (BMI) of 37.0 to 37.9 in adult (HCC)  E66.01 Amb Ref to Medical Weight Management   Z68.37     2. Morbid obesity with BMI of 40.0-44.9, adult (HCC)  E66.01    Z68.41     3.  Panniculitis  M79.3        Recommend healthy weight and wellness and then come and see [place] in 6 months.  Patient pleased with that plan.  Pictures were obtained of the patient and placed in the chart with the patient's or guardian's permission.

## 2023-02-11 ENCOUNTER — Other Ambulatory Visit: Payer: 59

## 2023-02-24 ENCOUNTER — Ambulatory Visit
Admission: RE | Admit: 2023-02-24 | Discharge: 2023-02-24 | Disposition: A | Discharge status: Home / Self Care | Payer: 59 | Source: Ambulatory Visit | Attending: Infectious Diseases | Admitting: Infectious Diseases

## 2023-02-24 DIAGNOSIS — J479 Bronchiectasis, uncomplicated: Secondary | ICD-10-CM | POA: Insufficient documentation

## 2023-02-24 DIAGNOSIS — Z9181 History of falling: Secondary | ICD-10-CM

## 2023-03-10 DIAGNOSIS — K921 Melena: Secondary | ICD-10-CM | POA: Insufficient documentation

## 2023-04-02 ENCOUNTER — Encounter: Payer: 59 | Admitting: Family Medicine

## 2023-04-06 DIAGNOSIS — I700 Atherosclerosis of aorta: Secondary | ICD-10-CM | POA: Insufficient documentation

## 2023-04-06 HISTORY — PX: OTHER SURGICAL HISTORY: SHX169

## 2023-04-28 ENCOUNTER — Ambulatory Visit
Admission: RE | Admit: 2023-04-28 | Discharge: 2023-04-28 | Disposition: A | Discharge status: Home / Self Care | Payer: 59 | Source: Ambulatory Visit | Attending: Internal Medicine | Admitting: Internal Medicine

## 2023-04-28 ENCOUNTER — Other Ambulatory Visit: Payer: Self-pay | Admitting: Internal Medicine

## 2023-04-28 DIAGNOSIS — M7989 Other specified soft tissue disorders: Secondary | ICD-10-CM | POA: Diagnosis present

## 2023-05-04 NOTE — Unmapped (Unsigned)
Otolaryngology New Consultation Visit Note      Reason for Visit:  OSA - does not tolerate CPAP    History of Present Illness    Renee Cunningham is 57 y.o. female being seen in consultation at the request of Dr. Elenore Rota for evaluation of OSA.     I personally reviewed ENT note   Patient sent for Inspire. Not tolerating CPAP. No teeth so can't get oral appliance     I personally reviewed compliance CPAP data  Never wearing more than 2 hours a night    I personally reviewed sleep study  07/27/21   BMI 42  AHI 4% - 8  AHI 3% -22    Has medicare plan.     Gasping/choking at night or stopping breathing:   Daytime fatigue:  Morning headaches:  Frequent naps:  Falling asleep at inappropriate times:  Blood pressure issues:  Difficultly breathing out of nose:  Sleep study:   CPAP use:  Mask that patient has tried  Compliance data   Other sleep surgery or oral appliance:  Lung or heart issues:  Bleeding issues or blood thinners:  Problems with GA:    The patient denies fevers, chills, shortness of breath, chest pain, nausea, vomiting, diarrhea, inability to lie flat, dysphagia, odynophagia, hemoptysis, hematemesis, changes in vision, changes in voice quality, otalgia, otorrhea, vertiginous symptoms, focal deficits, or other concerning symptoms.    Past Medical History     has a past medical history of Anxiety and Depression.    Past Surgical History     has a past surgical history that includes Hysterectomy (N/A, 2012).    Current Medications    Current Outpatient Medications   Medication Sig Dispense Refill    ALCOHOL PADS PadM Use as directed,once daily to test blood sugar      amLODIPine (NORVASC) 5 MG tablet       benztropine (COGENTIN) 1 MG tablet Take 1 tablet (1 mg total) by mouth daily.      cetirizine (ZYRTEC) 10 MG tablet TAKE 2 TABLETS BY MOUTH ONCE DAILY AT NIGHT 60 tablet 0    clobetasoL (TEMOVATE) 0.05 % cream Apply 1-2 times a day to the raised itchy spots on your body as needed . 30 g 0    clotrimazole (LOTRIMIN) 1 % cream APPLY TOPICALLY IN THE MORNING AND AT BEDTIME      clotrimazole-betamethasone (LOTRISONE) 1-0.05 % cream APPLY EXTERNALLY TWICE DAILY AS NEEDED FOR SYMPTOMS FOR UP TO 2 WEEKS      clotrimazole-betamethasone (LOTRISONE) 1-0.05 % cream Apply externally BID prn sx up to 2 wks      empty container Misc Use as directed to dispose of Xolair syringes 1 each 3    EPINEPHrine (EPIPEN) 0.3 mg/0.3 mL injection Inject 0.3 mL (0.3 mg total) into the muscle once for 1 dose. May repeat in 5 minutes if symptoms are not improved or worsening. 1 each PRN    famotidine (PEPCID) 20 MG tablet 1 tablet (20 mg total) two (2) times a day.      ferrous sulfate 325 (65 FE) MG tablet Take 1 tablet (325 mg total) by mouth in the morning.      FLUoxetine (PROZAC) 10 MG capsule TAKE 1 CAPSULE BY MOUTH IN THE MORNING      fluPHENAZine (PROLIXIN) 2.5 MG tablet Take 1 tablet (2.5 mg total) by mouth daily.      fluticasone propionate (FLONASE) 50 mcg/actuation nasal spray Place 2 sprays into both nostrils daily.  hydrocortisone 2.5 % lotion Apply 1-2 times a day to the raised itchy spots as needed on your face 59 mL 0    hydrOXYchloroQUINE (PLAQUENIL) 200 mg tablet Take 1 tablet (200 mg total) by mouth Two (2) times a day. 60 tablet 12    hydrOXYzine (ATARAX) 25 MG tablet Take 1 tablet (25 mg total) by mouth two (2) times a day as needed for itching (hives). 60 tablet 3    melatonin 5 mg Tab Take 1 tablet (5 mg total) by mouth nightly.      metoprolol succinate (TOPROL-XL) 25 MG 24 hr tablet       montelukast (SINGULAIR) 10 mg tablet Take 1 tablet (10 mg total) by mouth nightly. 90 tablet 3    mupirocin (BACTROBAN) 2 % ointment       omalizumab (XOLAIR) 150 mg/mL syringe Inject the contents of 2 syringes (300 mg total) under the skin every twenty-one (21) days. 2 mL 12    ONETOUCH ULTRA TEST Strp Use as directed,once daily to test blood sugar      oxyCODONE-acetaminophen (PERCOCET) 10-325 mg per tablet TAKE 1 TABLET BY MOUTH THREE TIMES DAILY AS NEEDED FOR CHRONIC PAIN      topiramate (TOPAMAX) 100 MG tablet TAKE 1 & 1/2 (ONE & ONE-HALF) TABLETS BY MOUTH IN THE EVENING      triamcinolone (KENALOG) 0.1 % cream Apply topically to affected areas twice daily as needed for rash. 454 g 2    triamcinolone (KENALOG) 0.1 % ointment Apply twice a day as needed only, only to areas with active hives breakouts. 80 g 6    VICTOZA 3-PAK 0.6 mg/0.1 mL (18 mg/3 mL) injection Inject 1.8 mg into the skin daily.       No current facility-administered medications for this visit.       Allergies    Allergies   Allergen Reactions    Cephalexin Rash    Hydrochlorothiazide Swelling     Mild she says    Losartan Hives and Swelling     Angioedema  Angioedema      Penicillins Rash    Metformin      Rash  Other reaction(s): Other (See Comments)  Rash    Venom-Honey Bee      Other reaction(s): Other (See Comments)  Swelling  Swelling      Lisinopril Other (See Comments)     Low BP       Family History    family history includes Cancer in her father and sister.    Social History:     reports that she has never smoked. She quit smokeless tobacco use about 11 years ago.  Her smokeless tobacco use included snuff.   reports no history of alcohol use.   reports no history of drug use.    Review of Systems    A full 12-system review of systems is documented and negative except as noted in HPI.  Patient intake form reviewed.    Vital Signs  not currently breastfeeding.    Physical Exam    General: Well-developed, well-nourished. Appropriate, comfortable, and in no apparent distress.  Voice: clear   Head/Face: On external examination there is no obvious asymmetry or scars. On palpation there is no masses within the salivary glands. Cranial nerves V and VII are intact through all distributions.  Eyes: PERRL, EOMI, the conjunctiva are not injected and sclera is non-icteric.  Ears:   Right ear: On external exam, there is no obvious lesions or  asymmetry. No cerumen or lesions in the EAC. The TM is in the neutral position and mobile to pneumatic otoscopy. No middle ear masses or fluid noted.   Hearing is grossly intact.  Left ear: On external exam, there is no obvious lesions or asymmetry. No cerumen or lesions in the EAC. The TM is in the neutral position and mobile to pneumatic otoscopy. No middle ear masses or fluid noted.   Hearing is grossly intact.  Nose: No external masses, lesions, or deformity. Anterior rhinoscopy of the nasal mucosa, septum, and turbinates reveals a normal exam.   Oral cavity/oropharynx: The mucosa of the lips, gums, hard and soft palate, posterior pharyngeal wall, tongue, floor of mouth, and buccal region are without masses or lesions and are normally hydrated. Good dentition. Tongue protrudes midline. Tonsils are symmetric without any masses or lesions. Supraglottis not visualized due to gag reflex. [place] ***  Neck: There is no asymmetry or masses. Trachea is midline. There is no enlargement of the thyroid or palpable thyroid nodules.   Lymphatics: There is no palpable lymphadenopathy along the jugulodiagastric, submental, or posterior cervical chains.  Chest: No audible wheeze, unlabored respirations.  Neurologic: Cranial nerve???s II-XII are grossly intact. Exam is non-focal.  Extremities: No cyanosis, clubbing or edema.    Procedures:  Flexible Fiberoptic Nasopharyngolaryngoscopy (CPT [id number]): To better evaluate the patient???s symptoms, fiberoptic laryngoscopy is indicated.  After discussion of risks and benefits, and topical decongestion and anesthesia, the endoscopy was performed without complications. A time out identifying the patient, the procedure, the location of the procedure and any concerns was performed prior to beginning the procedure.    Findings:  Absent adenoidal pad. Bilateral patent Eustachian tube orifices. Healthy, symmetric nasopharyngeal and pharyngeal mucosa with no masses, lesions, or friable mucosa. Symmetric base of tongue with clear vallecula bilaterally. No lingual tonsil hypertrophy.    Inspection of the larynx reveals no evidence of supraglottic or glottic masses. Epiglottis crisp. There is bilateral true vocal cord full range of motion with glottic competence. The piriform sinuses are clear bilaterally. No pooling of secretions in the piriform sinuses.     Outside Medical Records  I personally reviewed patient's medical records from the referring provider.     Assessment/Recommendations:    The patient is a 57 y.o. female with a history of  has a past medical history of Anxiety and Depression. who presents for the evaluation of OSA. Patient does not tolerate CPAP.      The pathophysiology and treatment of adult obstructive sleep apnea were discussed in detail. We discussed that untreated obstructive sleep apnea is associated with significant morbidity, mortality, and decreased quality of life. We discussed that in general, adult obstructive sleep apnea is best treated by CPAP, with a dental appliance/mandibular advancement device as a second line of therapy for patients with mild OSA and surgery for mild-severe OSA. We also discussed the importance of weight loss in controlling obstructive sleep apnea.    Surgery for adult obstructive sleep apnea is typically reserved for inability to tolerate a prolonged trial of these treatments, or when a specific, substantial level of anatomic obstruction is identified. We discussed that in many cases sleep surgery may result in improvement in quality of life and AHI, but sleep apnea may persist and require ongoing medical treatment. Sleep surgery may assist in making the CPAP mask more tolerable and potentially lowering positive airway pressures.     Discussed that there are different types of surgeries such as UPPP, TORS, Tonsillectomy, bariatric  surgery (N/A), MMA, hyoid suspension and hypoglossal nerve implant.      Given patient's anatomy and nasopharyngeal exam, patient would likely benefit from ***.     Recommend patient have a sleep endoscopy (DISE) to evaluate the airway while asleep to see what types of surgery will be most beneficial for patient's OSA. The risk, benefits and alternatives to DISE were discussed with patient including but not limited to bleeding, airway compromise and the risk of GA. Patient wishes to proceed. Will call scheduler to schedule.       The patient voiced complete understanding of plan as detailed above and is in full agreement.

## 2023-05-06 ENCOUNTER — Other Ambulatory Visit: Payer: Self-pay | Admitting: Internal Medicine

## 2023-05-06 ENCOUNTER — Ambulatory Visit
Admission: RE | Admit: 2023-05-06 | Discharge: 2023-05-06 | Disposition: A | Discharge status: Home / Self Care | Payer: 59 | Source: Ambulatory Visit | Attending: Internal Medicine | Admitting: Internal Medicine

## 2023-05-06 DIAGNOSIS — Z9181 History of falling: Secondary | ICD-10-CM

## 2023-05-06 DIAGNOSIS — M1711 Unilateral primary osteoarthritis, right knee: Secondary | ICD-10-CM

## 2023-05-17 NOTE — Unmapped (Unsigned)
 Otolaryngology New Consultation Visit Note      Reason for Visit:  OSA - does not tolerate CPAP    History of Present Illness    Renee Cunningham is 57 y.o. female being seen in consultation at the request of Dr. Elenore Rota for evaluation of OSA.     I personally reviewed ENT note   Patient sent for Inspire. Not tolerating CPAP. No teeth so can't get oral appliance     I personally reviewed compliance CPAP data  Never wearing more than 2 hours a night    I personally reviewed sleep study  AHI 4% - 8  AHI 3% -22    Has medicare plan.     Gasping/choking at night or stopping breathing:   Daytime fatigue:  Morning headaches:  Frequent naps:  Falling asleep at inappropriate times:  Blood pressure issues:  Difficultly breathing out of nose:  Sleep study:   CPAP use:  Mask that patient has tried  Compliance data   Other sleep surgery or oral appliance:  Lung or heart issues:  Bleeding issues or blood thinners:  Problems with GA:    The patient denies fevers, chills, shortness of breath, chest pain, nausea, vomiting, diarrhea, inability to lie flat, dysphagia, odynophagia, hemoptysis, hematemesis, changes in vision, changes in voice quality, otalgia, otorrhea, vertiginous symptoms, focal deficits, or other concerning symptoms.    Past Medical History     has a past medical history of Anxiety and Depression.    Past Surgical History     has a past surgical history that includes Hysterectomy (N/A, 2012).    Current Medications    Current Outpatient Medications   Medication Sig Dispense Refill    ALCOHOL PADS PadM Use as directed,once daily to test blood sugar      amLODIPine (NORVASC) 5 MG tablet       benztropine (COGENTIN) 1 MG tablet Take 1 tablet (1 mg total) by mouth daily.      cetirizine (ZYRTEC) 10 MG tablet TAKE 2 TABLETS BY MOUTH ONCE DAILY AT NIGHT 60 tablet 0    clobetasoL (TEMOVATE) 0.05 % cream Apply 1-2 times a day to the raised itchy spots on your body as needed . 30 g 0 clotrimazole (LOTRIMIN) 1 % cream APPLY TOPICALLY IN THE MORNING AND AT BEDTIME      clotrimazole-betamethasone (LOTRISONE) 1-0.05 % cream APPLY EXTERNALLY TWICE DAILY AS NEEDED FOR SYMPTOMS FOR UP TO 2 WEEKS      clotrimazole-betamethasone (LOTRISONE) 1-0.05 % cream Apply externally BID prn sx up to 2 wks      empty container Misc Use as directed to dispose of Xolair syringes 1 each 3    EPINEPHrine (EPIPEN) 0.3 mg/0.3 mL injection Inject 0.3 mL (0.3 mg total) into the muscle once for 1 dose. May repeat in 5 minutes if symptoms are not improved or worsening. 1 each PRN    famotidine (PEPCID) 20 MG tablet 1 tablet (20 mg total) two (2) times a day.      ferrous sulfate 325 (65 FE) MG tablet Take 1 tablet (325 mg total) by mouth in the morning.      FLUoxetine (PROZAC) 10 MG capsule TAKE 1 CAPSULE BY MOUTH IN THE MORNING      fluPHENAZine (PROLIXIN) 2.5 MG tablet Take 1 tablet (2.5 mg total) by mouth daily.      fluticasone propionate (FLONASE) 50 mcg/actuation nasal spray Place 2 sprays into both nostrils daily.      hydrocortisone 2.5 % lotion  Apply 1-2 times a day to the raised itchy spots as needed on your face 59 mL 0    hydrOXYchloroQUINE (PLAQUENIL) 200 mg tablet Take 1 tablet (200 mg total) by mouth Two (2) times a day. 60 tablet 12    hydrOXYzine (ATARAX) 25 MG tablet Take 1 tablet (25 mg total) by mouth two (2) times a day as needed for itching (hives). 60 tablet 3    melatonin 5 mg Tab Take 1 tablet (5 mg total) by mouth nightly.      metoprolol succinate (TOPROL-XL) 25 MG 24 hr tablet       montelukast (SINGULAIR) 10 mg tablet Take 1 tablet (10 mg total) by mouth nightly. 90 tablet 3    mupirocin (BACTROBAN) 2 % ointment       omalizumab (XOLAIR) 150 mg/mL syringe Inject the contents of 2 syringes (300 mg total) under the skin every twenty-one (21) days. 2 mL 12    ONETOUCH ULTRA TEST Strp Use as directed,once daily to test blood sugar      oxyCODONE-acetaminophen (PERCOCET) 10-325 mg per tablet TAKE 1 TABLET BY MOUTH THREE TIMES DAILY AS NEEDED FOR CHRONIC PAIN      topiramate (TOPAMAX) 100 MG tablet TAKE 1 & 1/2 (ONE & ONE-HALF) TABLETS BY MOUTH IN THE EVENING      triamcinolone (KENALOG) 0.1 % cream Apply topically to affected areas twice daily as needed for rash. 454 g 2    triamcinolone (KENALOG) 0.1 % ointment Apply twice a day as needed only, only to areas with active hives breakouts. 80 g 6    VICTOZA 3-PAK 0.6 mg/0.1 mL (18 mg/3 mL) injection Inject 1.8 mg into the skin daily.       No current facility-administered medications for this visit.       Allergies    Allergies   Allergen Reactions    Cephalexin Rash    Hydrochlorothiazide Swelling     Mild she says    Losartan Hives and Swelling     Angioedema  Angioedema      Penicillins Rash    Metformin      Rash  Other reaction(s): Other (See Comments)  Rash    Venom-Honey Bee      Other reaction(s): Other (See Comments)  Swelling  Swelling      Lisinopril Other (See Comments)     Low BP       Family History    family history includes Cancer in her father and sister.    Social History:     reports that she has never smoked. She quit smokeless tobacco use about 11 years ago.  Her smokeless tobacco use included snuff.   reports no history of alcohol use.   reports no history of drug use.    Review of Systems    A full 12-system review of systems is documented and negative except as noted in HPI.  Patient intake form reviewed.    Vital Signs  not currently breastfeeding.    Physical Exam    General: Well-developed, well-nourished. Appropriate, comfortable, and in no apparent distress.  Voice: clear   Head/Face: On external examination there is no obvious asymmetry or scars. On palpation there is no masses within the salivary glands. Cranial nerves V and VII are intact through all distributions.  Eyes: PERRL, EOMI, the conjunctiva are not injected and sclera is non-icteric.  Ears:   Right ear: On external exam, there is no obvious lesions or asymmetry. No cerumen or  lesions in the EAC. The TM is in the neutral position and mobile to pneumatic otoscopy. No middle ear masses or fluid noted.   Hearing is grossly intact.  Left ear: On external exam, there is no obvious lesions or asymmetry. No cerumen or lesions in the EAC. The TM is in the neutral position and mobile to pneumatic otoscopy. No middle ear masses or fluid noted.   Hearing is grossly intact.  Nose: No external masses, lesions, or deformity. Anterior rhinoscopy of the nasal mucosa, septum, and turbinates reveals a normal exam.   Oral cavity/oropharynx: The mucosa of the lips, gums, hard and soft palate, posterior pharyngeal wall, tongue, floor of mouth, and buccal region are without masses or lesions and are normally hydrated. Good dentition. Tongue protrudes midline. Tonsils are symmetric without any masses or lesions. Supraglottis not visualized due to gag reflex. Freidman ***  Neck: There is no asymmetry or masses. Trachea is midline. There is no enlargement of the thyroid or palpable thyroid nodules.   Lymphatics: There is no palpable lymphadenopathy along the jugulodiagastric, submental, or posterior cervical chains.  Chest: No audible wheeze, unlabored respirations.  Neurologic: Cranial nerve???s II-XII are grossly intact. Exam is non-focal.  Extremities: No cyanosis, clubbing or edema.    Procedures:  Flexible Fiberoptic Nasopharyngolaryngoscopy (CPT [id number]): To better evaluate the patient???s symptoms, fiberoptic laryngoscopy is indicated.  After discussion of risks and benefits, and topical decongestion and anesthesia, the endoscopy was performed without complications. A time out identifying the patient, the procedure, the location of the procedure and any concerns was performed prior to beginning the procedure.    Findings:  Absent adenoidal pad. Bilateral patent Eustachian tube orifices. Healthy, symmetric nasopharyngeal and pharyngeal mucosa with no masses, lesions, or friable mucosa. Symmetric base of tongue with clear vallecula bilaterally. No lingual tonsil hypertrophy.    Inspection of the larynx reveals no evidence of supraglottic or glottic masses. Epiglottis crisp. There is bilateral true vocal cord full range of motion with glottic competence. The piriform sinuses are clear bilaterally. No pooling of secretions in the piriform sinuses.     Outside Medical Records  I personally reviewed patient's medical records from the referring provider.     Assessment/Recommendations:    The patient is a 57 y.o. female with a history of  has a past medical history of Anxiety and Depression. who presents for the evaluation of OSA. Patient does not tolerate CPAP.      The pathophysiology and treatment of adult obstructive sleep apnea were discussed in detail. We discussed that untreated obstructive sleep apnea is associated with significant morbidity, mortality, and decreased quality of life. We discussed that in general, adult obstructive sleep apnea is best treated by CPAP, with a dental appliance/mandibular advancement device as a second line of therapy for patients with mild OSA and surgery for mild-severe OSA. We also discussed the importance of weight loss in controlling obstructive sleep apnea.    Surgery for adult obstructive sleep apnea is typically reserved for inability to tolerate a prolonged trial of these treatments, or when a specific, substantial level of anatomic obstruction is identified. We discussed that in many cases sleep surgery may result in improvement in quality of life and AHI, but sleep apnea may persist and require ongoing medical treatment. Sleep surgery may assist in making the CPAP mask more tolerable and potentially lowering positive airway pressures.     Discussed that there are different types of surgeries such as UPPP, TORS, Tonsillectomy, bariatric surgery (N/A), MMA, hyoid  suspension and hypoglossal nerve implant.      Given patient's anatomy and nasopharyngeal exam, patient would likely benefit from ***.     Recommend patient have a sleep endoscopy (DISE) to evaluate the airway while asleep to see what types of surgery will be most beneficial for patient's OSA. The risk, benefits and alternatives to DISE were discussed with patient including but not limited to bleeding, airway compromise and the risk of GA. Patient wishes to proceed. Will call scheduler to schedule.       The patient voiced complete understanding of plan as detailed above and is in full agreement.

## 2023-05-18 NOTE — Unmapped (Unsigned)
Otolaryngology New Consultation Visit Note      Reason for Visit:  OSA - does not tolerate CPAP    History of Present Illness    Renee Cunningham is 57 y.o. female being seen in consultation at the request of Dr. Elenore Rota for evaluation of OSA.     I personally reviewed ENT note   Patient sent for Inspire. Not tolerating CPAP. No teeth so can't get oral appliance     I personally reviewed compliance CPAP data  Never wearing more than 2 hours a night    I personally reviewed sleep study  07/27/21   BMI 42  AHI 4% - 8  AHI 3% -22    Has medicare plan.     Gasping/choking at night or stopping breathing:   Daytime fatigue:  Morning headaches:  Frequent naps:  Falling asleep at inappropriate times:  Blood pressure issues:  Difficultly breathing out of nose:  Sleep study:   CPAP use:  Mask that patient has tried  Compliance data   Other sleep surgery or oral appliance:  Lung or heart issues:  Bleeding issues or blood thinners:  Problems with GA:    The patient denies fevers, chills, shortness of breath, chest pain, nausea, vomiting, diarrhea, inability to lie flat, dysphagia, odynophagia, hemoptysis, hematemesis, changes in vision, changes in voice quality, otalgia, otorrhea, vertiginous symptoms, focal deficits, or other concerning symptoms.    Past Medical History     has a past medical history of Anxiety and Depression.    Past Surgical History     has a past surgical history that includes Hysterectomy (N/A, 2012).    Current Medications    Current Outpatient Medications   Medication Sig Dispense Refill    ALCOHOL PADS PadM Use as directed,once daily to test blood sugar      amLODIPine (NORVASC) 5 MG tablet       benztropine (COGENTIN) 1 MG tablet Take 1 tablet (1 mg total) by mouth daily.      cetirizine (ZYRTEC) 10 MG tablet TAKE 2 TABLETS BY MOUTH ONCE DAILY AT NIGHT 60 tablet 0    clobetasoL (TEMOVATE) 0.05 % cream Apply 1-2 times a day to the raised itchy spots on your body as needed . 30 g 0    clotrimazole (LOTRIMIN) 1 % cream APPLY TOPICALLY IN THE MORNING AND AT BEDTIME      clotrimazole-betamethasone (LOTRISONE) 1-0.05 % cream APPLY EXTERNALLY TWICE DAILY AS NEEDED FOR SYMPTOMS FOR UP TO 2 WEEKS      clotrimazole-betamethasone (LOTRISONE) 1-0.05 % cream Apply externally BID prn sx up to 2 wks      empty container Misc Use as directed to dispose of Xolair syringes 1 each 3    EPINEPHrine (EPIPEN) 0.3 mg/0.3 mL injection Inject 0.3 mL (0.3 mg total) into the muscle once for 1 dose. May repeat in 5 minutes if symptoms are not improved or worsening. 1 each PRN    famotidine (PEPCID) 20 MG tablet 1 tablet (20 mg total) two (2) times a day.      ferrous sulfate 325 (65 FE) MG tablet Take 1 tablet (325 mg total) by mouth in the morning.      FLUoxetine (PROZAC) 10 MG capsule TAKE 1 CAPSULE BY MOUTH IN THE MORNING      fluPHENAZine (PROLIXIN) 2.5 MG tablet Take 1 tablet (2.5 mg total) by mouth daily.      fluticasone propionate (FLONASE) 50 mcg/actuation nasal spray Place 2 sprays into both nostrils daily.  hydrocortisone 2.5 % lotion Apply 1-2 times a day to the raised itchy spots as needed on your face 59 mL 0    hydrOXYchloroQUINE (PLAQUENIL) 200 mg tablet Take 1 tablet (200 mg total) by mouth Two (2) times a day. 60 tablet 12    hydrOXYzine (ATARAX) 25 MG tablet Take 1 tablet (25 mg total) by mouth two (2) times a day as needed for itching (hives). 60 tablet 3    melatonin 5 mg Tab Take 1 tablet (5 mg total) by mouth nightly.      metoprolol succinate (TOPROL-XL) 25 MG 24 hr tablet       montelukast (SINGULAIR) 10 mg tablet Take 1 tablet (10 mg total) by mouth nightly. 90 tablet 3    mupirocin (BACTROBAN) 2 % ointment       omalizumab (XOLAIR) 150 mg/mL syringe Inject the contents of 2 syringes (300 mg total) under the skin every twenty-one (21) days. 2 mL 12    ONETOUCH ULTRA TEST Strp Use as directed,once daily to test blood sugar      oxyCODONE-acetaminophen (PERCOCET) 10-325 mg per tablet TAKE 1 TABLET BY MOUTH THREE TIMES DAILY AS NEEDED FOR CHRONIC PAIN      topiramate (TOPAMAX) 100 MG tablet TAKE 1 & 1/2 (ONE & ONE-HALF) TABLETS BY MOUTH IN THE EVENING      triamcinolone (KENALOG) 0.1 % cream Apply topically to affected areas twice daily as needed for rash. 454 g 2    triamcinolone (KENALOG) 0.1 % ointment Apply twice a day as needed only, only to areas with active hives breakouts. 80 g 6    VICTOZA 3-PAK 0.6 mg/0.1 mL (18 mg/3 mL) injection Inject 1.8 mg into the skin daily.       No current facility-administered medications for this visit.       Allergies    Allergies   Allergen Reactions    Cephalexin Rash    Hydrochlorothiazide Swelling     Mild she says    Losartan Hives and Swelling     Angioedema  Angioedema      Penicillins Rash    Metformin      Rash  Other reaction(s): Other (See Comments)  Rash    Venom-Honey Bee      Other reaction(s): Other (See Comments)  Swelling  Swelling      Lisinopril Other (See Comments)     Low BP       Family History    family history includes Cancer in her father and sister.    Social History:     reports that she has never smoked. She quit smokeless tobacco use about 11 years ago.  Her smokeless tobacco use included snuff.   reports no history of alcohol use.   reports no history of drug use.    Review of Systems    A full 12-system review of systems is documented and negative except as noted in HPI.  Patient intake form reviewed.    Vital Signs  not currently breastfeeding.    Physical Exam    General: Well-developed, well-nourished. Appropriate, comfortable, and in no apparent distress.  Voice: clear   Head/Face: On external examination there is no obvious asymmetry or scars. On palpation there is no masses within the salivary glands. Cranial nerves V and VII are intact through all distributions.  Eyes: PERRL, EOMI, the conjunctiva are not injected and sclera is non-icteric.  Ears:   Right ear: On external exam, there is no obvious lesions or  asymmetry. No cerumen or lesions in the EAC. The TM is in the neutral position and mobile to pneumatic otoscopy. No middle ear masses or fluid noted.   Hearing is grossly intact.  Left ear: On external exam, there is no obvious lesions or asymmetry. No cerumen or lesions in the EAC. The TM is in the neutral position and mobile to pneumatic otoscopy. No middle ear masses or fluid noted.   Hearing is grossly intact.  Nose: No external masses, lesions, or deformity. Anterior rhinoscopy of the nasal mucosa, septum, and turbinates reveals a normal exam.   Oral cavity/oropharynx: The mucosa of the lips, gums, hard and soft palate, posterior pharyngeal wall, tongue, floor of mouth, and buccal region are without masses or lesions and are normally hydrated. Good dentition. Tongue protrudes midline. Tonsils are symmetric without any masses or lesions. Supraglottis not visualized due to gag reflex. [place] ***  Neck: There is no asymmetry or masses. Trachea is midline. There is no enlargement of the thyroid or palpable thyroid nodules.   Lymphatics: There is no palpable lymphadenopathy along the jugulodiagastric, submental, or posterior cervical chains.  Chest: No audible wheeze, unlabored respirations.  Neurologic: Cranial nerve???s II-XII are grossly intact. Exam is non-focal.  Extremities: No cyanosis, clubbing or edema.    Procedures:  Flexible Fiberoptic Nasopharyngolaryngoscopy (CPT [id number]): To better evaluate the patient???s symptoms, fiberoptic laryngoscopy is indicated.  After discussion of risks and benefits, and topical decongestion and anesthesia, the endoscopy was performed without complications. A time out identifying the patient, the procedure, the location of the procedure and any concerns was performed prior to beginning the procedure.    Findings:  Absent adenoidal pad. Bilateral patent Eustachian tube orifices. Healthy, symmetric nasopharyngeal and pharyngeal mucosa with no masses, lesions, or friable mucosa. Symmetric base of tongue with clear vallecula bilaterally. No lingual tonsil hypertrophy.    Inspection of the larynx reveals no evidence of supraglottic or glottic masses. Epiglottis crisp. There is bilateral true vocal cord full range of motion with glottic competence. The piriform sinuses are clear bilaterally. No pooling of secretions in the piriform sinuses.     Outside Medical Records  I personally reviewed patient's medical records from the referring provider.     Assessment/Recommendations:    The patient is a 57 y.o. female with a history of  has a past medical history of Anxiety and Depression. who presents for the evaluation of OSA. Patient does not tolerate CPAP.      The pathophysiology and treatment of adult obstructive sleep apnea were discussed in detail. We discussed that untreated obstructive sleep apnea is associated with significant morbidity, mortality, and decreased quality of life. We discussed that in general, adult obstructive sleep apnea is best treated by CPAP, with a dental appliance/mandibular advancement device as a second line of therapy for patients with mild OSA and surgery for mild-severe OSA. We also discussed the importance of weight loss in controlling obstructive sleep apnea.    Surgery for adult obstructive sleep apnea is typically reserved for inability to tolerate a prolonged trial of these treatments, or when a specific, substantial level of anatomic obstruction is identified. We discussed that in many cases sleep surgery may result in improvement in quality of life and AHI, but sleep apnea may persist and require ongoing medical treatment. Sleep surgery may assist in making the CPAP mask more tolerable and potentially lowering positive airway pressures.     Discussed that there are different types of surgeries such as UPPP, TORS, Tonsillectomy, bariatric  surgery (N/A), MMA, hyoid suspension and hypoglossal nerve implant.      Given patient's anatomy and nasopharyngeal exam, patient would likely benefit from ***.     Recommend patient have a sleep endoscopy (DISE) to evaluate the airway while asleep to see what types of surgery will be most beneficial for patient's OSA. The risk, benefits and alternatives to DISE were discussed with patient including but not limited to bleeding, airway compromise and the risk of GA. Patient wishes to proceed. Will call scheduler to schedule.       The patient voiced complete understanding of plan as detailed above and is in full agreement.

## 2023-05-21 ENCOUNTER — Encounter (INDEPENDENT_AMBULATORY_CARE_PROVIDER_SITE_OTHER): Payer: Self-pay

## 2023-06-18 ENCOUNTER — Telehealth: Payer: Self-pay

## 2023-06-18 ENCOUNTER — Encounter: Payer: Self-pay | Admitting: General Practice

## 2023-06-18 NOTE — Telephone Encounter (Signed)
Patient has an AWV scheduled on 06/23/2023.  I have cancelled this appointment.  Patient was a patient of Dr. Quentin Ore.  Patient has not had a TOC appointment.  Patient must schedule a TOC appointment before she can have an AWV.  When patient calls, please schedule a TOC appointment and then reschedule her AWV.  06/18/2023 - patient's mobile number is not in service. No answer when calling patient's home number. Sent letter to pt via MyChart. Sent text message to pt.

## 2023-06-22 ENCOUNTER — Encounter: Payer: Self-pay | Admitting: Physical Medicine and Rehabilitation

## 2023-07-02 ENCOUNTER — Encounter: Payer: 59 | Attending: Physical Medicine and Rehabilitation | Admitting: Physical Medicine and Rehabilitation

## 2023-07-02 ENCOUNTER — Encounter: Payer: Self-pay | Admitting: Physical Medicine and Rehabilitation

## 2023-07-02 VITALS — BP 112/75 | HR 76 | Ht 63.0 in | Wt 248.0 lb

## 2023-07-02 DIAGNOSIS — G4701 Insomnia due to medical condition: Secondary | ICD-10-CM | POA: Diagnosis present

## 2023-07-02 DIAGNOSIS — Z6841 Body Mass Index (BMI) 40.0 and over, adult: Secondary | ICD-10-CM | POA: Diagnosis present

## 2023-07-02 DIAGNOSIS — G8929 Other chronic pain: Secondary | ICD-10-CM | POA: Insufficient documentation

## 2023-07-02 DIAGNOSIS — M25561 Pain in right knee: Secondary | ICD-10-CM | POA: Insufficient documentation

## 2023-07-02 DIAGNOSIS — M25562 Pain in left knee: Secondary | ICD-10-CM | POA: Insufficient documentation

## 2023-07-02 MED ORDER — TOPIRAMATE 25 MG PO TABS
25.0000 mg | ORAL_TABLET | Freq: Two times a day (BID) | ORAL | 3 refills | Status: DC
Start: 2023-07-02 — End: 2023-11-27

## 2023-07-02 NOTE — Patient Instructions (Signed)
Insomnia: -Try to go outside near sunrise -Get exercise during the day.  -Turn off all devices an hour before bedtime.  -Teas that can benefit: chamomile, valerian root, Brahmi (Bacopa) -Can consider over the counter melatonin, magnesium, and/or L-theanine. Melatonin is an anti-oxidant with multiple health benefits. Magnesium is involved in greater than 300 enzymatic reactions in the body and most of [place] are deficient as our soil is often depleted. There are 7 different types of magnesium- Bioptemizer's is a supplement with all 7 types, and each has unique benefits. Magnesium can also help with constipation and anxiety.  -Pistachios naturally increase the production of melatonin -Cozy Earth bamboo bed sheets are free from toxic chemicals.  -Tart cherry juice or a tart cherry supplement can improve sleep and soreness post-workout   Chronic Pain Syndrome secondary to___ -Discussed current symptoms of pain and history of pain.  -Discussed benefits of exercise in reducing pain. -Discussed following foods that may reduce pain: 1) Ginger (especially studied for arthritis)- reduce leukotriene production to decrease inflammation 2) Blueberries- high in phytonutrients that decrease inflammation 3) Salmon- marine omega-3s reduce joint swelling and pain 4) Pumpkin seeds- reduce inflammation 5) dark chocolate- reduces inflammation 6) turmeric- reduces inflammation 7) tart cherries - reduce pain and stiffness 8) extra virgin olive oil - its compound olecanthal helps to block prostaglandins  9) chili peppers- can be eaten or applied topically via capsaicin 10) mint- helpful for headache, muscle aches, joint pain, and itching 11) garlic- reduces inflammation  Link to further information on diet for chronic pain: [email or web address]

## 2023-07-02 NOTE — Progress Notes (Signed)
Subjective:    Patient ID: Heather Sweeney, female    DOB: 08/29/1966, 57 y.o.   MRN: [id number]  HPI Heather Sweeney is a 57 year old woman who presents for bilateral knee pain.  1) Bilateral knee pain: -she has had pain for 7 years -she has tried muscle relaxers, no oral meds -has not done PT -she is interested in being able to walk.  -voltaren was not helpful -she tried blue emu cream  2) Obesity: BMI is 43.93  Pain Inventory Average Pain 10 Pain Right Now 10 My pain is sharp, burning, and aching  In the last 24 hours, has pain interfered with the following? General activity 10 Relation with others 10 Enjoyment of life 10 What TIME of day is your pain at its worst? morning , daytime, evening, and night Sleep (in general) Fair  Pain is worse with: bending, standing, and some activites Pain improves with: medication and injections Relief from Meds: 10  use a cane ability to climb steps?  no do you drive?  yes Do you have any goals in this area?  yes  disabled: date disabled   I need assistance with the following:  shopping  tremor tingling trouble walking  Any changes since last visit?  no  Any changes since last visit?  no    Family History  Problem Relation Age of Onset   Heart failure Mother    Hypertension Mother    Early death Mother    Heart disease Mother        chf   Cancer Father        esophageal    Hypertension Father    Breast cancer Sister 45   Cancer Sister        breast   Depression Sister    Mental illness Sister    Cancer Paternal Aunt        brain cancer   Asthma Son    Social History   Socioeconomic History   Marital status: Married    Spouse name: Not on file   Number of children: Not on file   Years of education: Not on file   Highest education level: Not on file  Occupational History   Not on file  Tobacco Use   Smoking status: Never   Smokeless tobacco: Current    Types: Chew  Vaping Use   Vaping status: Never  Used  Substance and Sexual Activity   Alcohol use: No    Alcohol/week: 0.0 standard drinks of alcohol   Drug use: No   Sexual activity: Not Currently    Birth control/protection: Surgical    Comment: Hysterectomy  Other Topics Concern   Not on file  Social History Narrative   Married    No guns    Wears seat belt    Safe in relationship    Never smoker    2 sons Judie Grieve ~10 and another son ~28 as of 10/2019   1 granson eli 1 y.o as of 10/2019   2 sisters    Unemployed       Social Determinants of Health   Financial Resource Strain: Low Risk  (06/19/2022)   Overall Financial Resource Strain (CARDIA)    Difficulty of Paying Living Expenses: Not hard at all  Food Insecurity: No Food Insecurity (06/19/2022)   Hunger Vital Sign    Worried About Running Out of Food in the Last Year: Never true    Ran Out of Food in the Last  Year: Never true  Transportation Needs: No Transportation Needs (06/19/2022)   PRAPARE - Administrator, Civil Service (Medical): No    Lack of Transportation (Non-Medical): No  Physical Activity: Insufficiently Active (07/07/2019)   Exercise Vital Sign    Days of Exercise per Week: 3 days    Minutes of Exercise per Session: 30 min  Stress: No Stress Concern Present (06/19/2022)   [employer] of Occupational Health - Occupational Stress Questionnaire    Feeling of Stress : Not at all  Social Connections: Unknown (06/19/2022)   Social Connection and Isolation Panel [NHANES]    Frequency of Communication with Friends and Family: More than three times a week    Frequency of Social Gatherings with Friends and Family: More than three times a week    Attends Religious Services: Not on file    Active Member of Clubs or Organizations: Not on file    Attends Banker Meetings: Not on file    Marital Status: Married   Past Surgical History:  Procedure Laterality Date   ABDOMINAL HYSTERECTOMY  07/08/2011   CARPAL TUNNEL RELEASE     right  hand 1996   CESAREAN SECTION     2012   CHOLECYSTECTOMY N/A 01/12/2018   Procedure: LAPAROSCOPIC CHOLECYSTECTOMY WITH INTRAOPERATIVE CHOLANGIOGRAM ERAS PATHWAY;  Surgeon: Luretha Murphy, MD;  Location: [facility] ORS;  Service: General;  Laterality: N/A;   COLONOSCOPY WITH PROPOFOL N/A 04/15/2019   Procedure: COLONOSCOPY WITH PROPOFOL;  Surgeon: [name], MD;  Location: [facility] END[facility]COPY;  Service: Gastroenterology;  Laterality: N/A;   DILATION AND CURETTAGE OF UTERUS     04/2011 benign endometrial polyp squamous metaplasia, inflamm, blood, mucous, benign    ESOPHAGOGASTRODUODENOSCOPY (EGD) WITH PROPOFOL N/A 10/23/2020   Procedure: ESOPHAGOGASTRODUODENOSCOPY (EGD) WITH PROPOFOL;  Surgeon: [name], MD;  Location: [facility] ENDOSCOPY;  Service: Gastroenterology;  Laterality: N/A;   FLEXIBLE SIGMOIDOSCOPY N/A 02/15/2021   Procedure: FLEXIBLE SIGMOIDOSCOPY;  Surgeon: [name], MD;  Location: [facility] ENDOSCOPY;  Service: Gastroenterology;  Laterality: N/A;   FOOT SURGERY     Dr. Stacie Acres    LEEP     2006   PANNICULECTOMY N/A 04/24/2022   Procedure: INFRAUMBILICAL PANNICULECTOMY;  Surgeon: Peggye Form, DO;  Location: [facility] OR;  Service: Plastics;  Laterality: N/A;   Right hand carpal tunnel release Right 10/2022   TUBAL LIGATION     Past Medical History:  Diagnosis Date   Abnormal MRI, lumbar spine 02/12/2021   Abrasion of anterior left lower leg 07/10/2022   Allergic rhinitis    Annual physical exam 04/20/2020   Anxiety    Aperistalsis, esophagus 10/25/2020   Arthritis of knee, right    Arthritis of right knee 10/25/2020   Asthma    Bacterial vaginosis 10/20/2019   Bipolar disorder (HCC)    BMI 37.0-37.9, adult 03/07/2022   BMI 40.0-44.9, adult (HCC) 06/17/2022   Bronchiectasis without complication (HCC) 04/04/2022   Chronic low back pain    Chronic midline low back pain 12/17/2021   Class 3 severe obesity due to excess calories in adult [facility]) 04/09/2022   CTS (carpal tunnel syndrome)     hands   Depression    Early satiety 02/11/2022   Elevated liver enzymes 08/17/2020   Encounter for screening colonoscopy    Encounter for screening for COVID-19 10/10/2022   Frequent PVCs 09/24/2016   Gallstones    GERD (gastroesophageal reflux disease)    History of prediabetes    Hives 02/23/2019   Hypertension  Hypokalemia 12/13/2021   Incontinence of feces 02/12/2021   Ingrown right big toenail 04/20/2020   Iron deficiency anemia 12/13/2021   Laceration of right foot    Lymphedema 08/24/2022   Obesity (BMI 30-39.9) 09/10/2021   Onychomycosis    OSA on CPAP    does not use cpap   Osteoarthritis of knee 12/17/2021   Overactive bladder    Pain and swelling of right knee 04/20/2020   Pre-diabetes    diet controlled   PVC's (premature ventricular contractions)    PVC's (premature ventricular contractions)    Dr. Kirtland Bouchard cards   S/P laparoscopic cholecystectomy April 2019 01/12/2018   Seizures (HCC)    8th grade   Tachycardia 10/10/2022   Urticaria 02/23/2019   Weight loss 02/11/2022   Ht 5\' 3"  (1.6 m)   Wt 248 lb (112.5 kg)   BMI 43.93 kg/m   Opioid Risk Score:   Fall Risk Score:  `1  Depression screen [facility] 2/9     07/02/2023   10:51 AM 11/05/2022   10:24 AM 10/16/2022    2:00 PM 09/23/2022   11:03 AM 07/10/2022    2:25 PM 06/25/2022    9:19 AM 06/19/2022    9:34 AM  Depression screen PHQ 2/9  Decreased Interest 1 1 0 0 0 2 0  Down, Depressed, Hopeless 1 1 1  0 0 2 0  PHQ - 2 Score 2 2 1  0 0 4 0  Altered sleeping 1 1 1  0  2   Tired, decreased energy 1 1 1  0  1   Change in appetite 3 1 1  0  0   Feeling bad or failure about yourself  1 1 0 0  0   Trouble concentrating 1 1 1  0  0   Moving slowly or fidgety/restless 1 0 0 0  2   Suicidal thoughts 0 0 0 0  0   PHQ-9 Score 10 7 5  0  9   Difficult doing work/chores Not difficult at all Not difficult at all Not difficult at all Not difficult at all  Not difficult at all       Review of Systems  Musculoskeletal:   Positive for back pain and gait problem.       B/L knee pain   All other systems reviewed and are negative.     Objective:   Physical Exam Gen: no distress, normal appearing HEENT: oral mucosa pink and moist, NCAT Cardio: Reg rate Chest: normal effort, normal rate of breathing Abd: soft, non-distended Ext: no edema Psych: pleasant, normal affect Skin: intact Neuro: Alert and oriented MSK: medial aspect of right knee is swollen      Assessment & Plan:   1) Bilateral knee pain -MRI right knee ordered -PT ordered  -recommended trial of topamax -Provided with a pain relief journal and discussed that it contains foods and lifestyle tips to naturally help to improve pain. Discussed that these lifestyle strategies are also very good for health unlike some medications which can have negative side effects. Discussed that the act of keeping a journal can be therapeutic and helpful to realize patterns what helps to trigger and alleviate pain.   -discussed voltaren gel  2) Obesity: Prescribed topamax  3)Insomnia: -Try to go outside near sunrise -Get exercise during the day.  -Turn off all devices an hour before bedtime.  -Teas that can benefit: chamomile, valerian root, Brahmi (Bacopa) -Can consider over the counter melatonin, magnesium, and/or L-theanine. Melatonin is an anti-oxidant with  multiple health benefits. Magnesium is involved in greater than 300 enzymatic reactions in the body and most of [place] are deficient as our soil is often depleted. There are 7 different types of magnesium- Bioptemizer's is a supplement with all 7 types, and each has unique benefits. Magnesium can also help with constipation and anxiety.  -Pistachios naturally increase the production of melatonin -Cozy Earth bamboo bed sheets are free from toxic chemicals.  -Tart cherry juice or a tart cherry supplement can improve sleep and soreness post-workout

## 2023-07-09 ENCOUNTER — Ambulatory Visit (HOSPITAL_COMMUNITY): Payer: 59

## 2023-07-10 ENCOUNTER — Ambulatory Visit: Admission: RE | Admit: 2023-07-10 | Payer: 59 | Source: Ambulatory Visit

## 2023-07-10 ENCOUNTER — Ambulatory Visit: Payer: 59 | Attending: Physical Medicine and Rehabilitation | Admitting: Physical Therapy

## 2023-07-10 DIAGNOSIS — M25562 Pain in left knee: Secondary | ICD-10-CM | POA: Insufficient documentation

## 2023-07-10 DIAGNOSIS — R269 Unspecified abnormalities of gait and mobility: Secondary | ICD-10-CM | POA: Diagnosis present

## 2023-07-10 DIAGNOSIS — G8929 Other chronic pain: Secondary | ICD-10-CM | POA: Insufficient documentation

## 2023-07-10 DIAGNOSIS — M6281 Muscle weakness (generalized): Secondary | ICD-10-CM | POA: Diagnosis present

## 2023-07-10 DIAGNOSIS — R262 Difficulty in walking, not elsewhere classified: Secondary | ICD-10-CM | POA: Insufficient documentation

## 2023-07-10 DIAGNOSIS — M25561 Pain in right knee: Secondary | ICD-10-CM | POA: Insufficient documentation

## 2023-07-10 NOTE — Therapy (Signed)
OUTPATIENT PHYSICAL THERAPY LOWER EXTREMITY EVALUATION   Patient Name: Heather Sweeney MRN: [id number] DOB:27-May-1966, 57 y.o., female Today's Date: 07/10/2023  END OF SESSION:  PT End of Session - 07/10/23 0937     Visit Number 1    Number of Visits 16    Date for PT Re-Evaluation 09/04/23    Progress Note Due on Visit 10    PT Start Time 0933    PT Stop Time 1014    PT Time Calculation (min) 41 min    Activity Tolerance Patient tolerated treatment well             Past Medical History:  Diagnosis Date   Abnormal MRI, lumbar spine 02/12/2021   Abrasion of anterior left lower leg 07/10/2022   Allergic rhinitis    Annual physical exam 04/20/2020   Anxiety    Aperistalsis, esophagus 10/25/2020   Arthritis of knee, right    Arthritis of right knee 10/25/2020   Asthma    Bacterial vaginosis 10/20/2019   Bipolar disorder (HCC)    BMI 37.0-37.9, adult 03/07/2022   BMI 40.0-44.9, adult (HCC) 06/17/2022   Bronchiectasis without complication (HCC) 04/04/2022   Chronic low back pain    Chronic midline low back pain 12/17/2021   Class 3 severe obesity due to excess calories in adult (HCC) 04/09/2022   CTS (carpal tunnel syndrome)    hands   Depression    Early satiety 02/11/2022   Elevated liver enzymes 08/17/2020   Encounter for screening colonoscopy    Encounter for screening for COVID-19 10/10/2022   Frequent PVCs 09/24/2016   Gallstones    GERD (gastroesophageal reflux disease)    History of prediabetes    Hives 02/23/2019   Hypertension    Hypokalemia 12/13/2021   Incontinence of feces 02/12/2021   Ingrown right big toenail 04/20/2020   Iron deficiency anemia 12/13/2021   Laceration of right foot    Lymphedema 08/24/2022   Obesity (BMI 30-39.9) 09/10/2021   Onychomycosis    OSA on CPAP    does not use cpap   Osteoarthritis of knee 12/17/2021   Overactive bladder    Pain and swelling of right knee 04/20/2020   Pre-diabetes    diet controlled   PVC's  (premature ventricular contractions)    PVC's (premature ventricular contractions)    Dr. Kirtland Bouchard cards   S/P laparoscopic cholecystectomy April 2019 01/12/2018   Seizures (HCC)    8th grade   Tachycardia 10/10/2022   Urticaria 02/23/2019   Weight loss 02/11/2022   Past Surgical History:  Procedure Laterality Date   ABDOMINAL HYSTERECTOMY  07/08/2011   CARPAL TUNNEL RELEASE     right hand 1996   CESAREAN SECTION     2012   CHOLECYSTECTOMY N/A 01/12/2018   Procedure: LAPAROSCOPIC CHOLECYSTECTOMY WITH INTRAOPERATIVE CHOLANGIOGRAM ERAS PATHWAY;  Surgeon: Luretha Murphy, MD;  Location: [facility]S;  Service: General;  Laterality: N/A;   COLONOSCOPY WITH PROPOFOL N/A 04/15/2019   Procedure: COLONOSCOPY WITH PROPOFOL;  Surgeon: [name], MD;  Location: [facility] ENDOSCOPY;  Service: Gastroenterology;  Laterality: N/A;   DILATION AND CURETTAGE OF UTERUS     04/2011 benign endometrial polyp squamous metaplasia, inflamm, blood, mucous, benign    ESOPHAGOGASTRODUODENOSCOPY (EGD) WITH PROPOFOL N/A 10/23/2020   Procedure: ESOPHAGOGASTRODUODENOSCOPY (EGD) WITH PROPOFOL;  Surgeon: [name], MD;  Location: [facility] ENDOSCOPY;  Service: Gastroenterology;  Laterality: N/A;   FLEXIBLE SIGMOIDOSCOPY N/A 02/15/2021   Procedure: FLEXIBLE SIGMOIDOSCOPY;  Surgeon: [name], MD;  Location: [facility] ENDOSCOPY;  Service: Gastroenterology;  Laterality: N/A;   FOOT SURGERY     Dr. Stacie Acres    LEEP     2006   PANNICULECTOMY N/A 04/24/2022   Procedure: INFRAUMBILICAL PANNICULECTOMY;  Surgeon: Peggye Form, DO;  Location: [facility] OR;  Service: Plastics;  Laterality: N/A;   Right hand carpal tunnel release Right 10/2022   TUBAL LIGATION     Patient Active Problem List   Diagnosis Date Noted   Smoking history 11/05/2022   High serum vitamin B12 10/25/2022   Lymphedema 08/24/2022   Panniculitis 04/22/2022   Stage 3a chronic kidney disease (HCC) 03/16/2022   Chronic idiopathic urticaria 12/12/2021   Intertrigo 02/12/2021    Insomnia 10/11/2020   Arthritis of both knees 06/08/2020   Gastroesophageal reflux disease 04/20/2020   Mitral valve insufficiency 01/10/2020   Vitamin D deficiency 02/01/2019   Mood disorder (HCC) 12/24/2018   HLD (hyperlipidemia) 09/01/2018   History of prediabetes 09/01/2018   Chronic pain 08/31/2018   Allergic rhinitis 08/31/2018   Lumbosacral radiculitis 02/19/2018   Angioedema 07/07/2017   Bilateral carpal tunnel syndrome 11/03/2016   Morbid obesity with BMI of 40.0-44.9, adult (HCC) 08/07/2016   Essential hypertension 07/10/2016   OSA on CPAP 07/10/2016    PCP: Louis Matte, MD  REFERRING PROVIDER:   Horton Chin, MD    REFERRING DIAG: M25.561,M25.562,G89.29 (ICD-10-CM) - Chronic pain of both knees   THERAPY DIAG:  Chronic pain of left knee  Chronic pain of right knee  Difficulty in walking, not elsewhere classified  Muscle weakness (generalized)  Abnormality of gait and mobility  Rationale for Evaluation and Treatment: Rehabilitation  ONSET DATE: 07/07/2015  SUBJECTIVE:   SUBJECTIVE STATEMENT: Pt has had multiple falls in the last 6 months. Pt had imaging done following this and she has OA in the joint ( tricompartmental). Pt has had knee pain for the last 8 years. Pt reports the R knee pain is worse with food left knee rated.  Patient ambulates with rollator for longer distances and utilizes a hurry cane for shorter distances.  PERTINENT HISTORY: Patient has history of obesity, bilateral knee osteoarthritis, hypertension, PAIN:  Are you having pain? Yes: NPRS scale: 8/10 Pain location: R > L knee  Pain description: sharp pain Aggravating factors: getting up from a chair  Relieving factors: rest   PRECAUTIONS: Fall  RED FLAGS: None   WEIGHT BEARING RESTRICTIONS: No  FALLS:  Has patient fallen in last 6 months? Yes. Number of falls 5 fell 2 times in parking lot when she hurt her knees  LIVING ENVIRONMENT: Lives with: lives  with their family and lives with their spouse Lives in: House/apartment Stairs: Yes: External: 2 steps; on right going up Has following equipment at home: Single point cane and Walker - 4 wheeled  OCCUPATION: Disabled  PLOF: Independent with household mobility with device, Independent with community mobility with device, and Needs assistance with transfers  PATIENT GOALS: Improve knee pain and mobility  NEXT MD VISIT: 07/27/23  OBJECTIVE:  Note: Objective measures were completed at Evaluation unless otherwise noted.  DIAGNOSTIC FINDINGS: Tricompartmental osteoarthritis noted in most previous imaging per imaging report  PATIENT SURVEYS:  FOTO 55  COGNITION: Overall cognitive status: Within functional limits for tasks assessed     SENSATION: WFL  EDEMA:  Clear edema noted on right lower extremity on medial side of knee.  Patient does have bilateral edema noted which may be due to lymphedema as noted in history    POSTURE:  Flexed  posture and gait with no suitable flexion in the hip  PALPATION: TTP along hamstring, patellar and quad tendon and all area surrounding kneecap   LOWER EXTREMITY ROM:  Active ROM Right eval Left eval  Hip flexion    Hip extension    Hip abduction    Hip adduction    Hip internal rotation    Hip external rotation    Knee flexion 97 105  Knee extension 0 0  Ankle dorsiflexion    Ankle plantarflexion    Ankle inversion    Ankle eversion     (Blank rows = not tested)  LOWER EXTREMITY MMT:  MMT Right eval Left eval  Hip flexion 4 4  Hip extension    Hip abduction    Hip adduction    Hip internal rotation    Hip external rotation    Knee flexion 3+* 4  Knee extension 3+* 4  Ankle dorsiflexion 5 5  Ankle plantarflexion 5 5  Ankle inversion    Ankle eversion     (Blank rows = not tested)    FUNCTIONAL TESTS:  5 times sit to stand: 18.1 sec  GAIT: Distance walked: 70M Assistive device utilized: Environmental consultant - 4 wheeled Level  of assistance: Modified independence Comments: flexed posture (hip flexion) throughout with heavy lean on rollator    TODAY'S TREATMENT:                                                                                                                              Eval Only   PATIENT EDUCATION:  Education details: POC Person educated: Patient Education method: Explanation Education comprehension: verbalized understanding   HOME EXERCISE PROGRAM: Establish visit 2    ASSESSMENT: Note: Portions of this document were prepared using Conservation officer, historic buildings and although reviewed may contain unintentional dictation errors in syntax, grammar, or spelling.   CLINICAL IMPRESSION: Patient is a 57 y.o. F who was seen today for physical therapy evaluation and treatment for bilateral knee pain with R > L knee pain. Pt presents with deficits in mobility and balance AEB by objective measure completed on eval. Pt has medial swelling on B knees again with R > L. Pt also has pain with standing, frequent falls, and difficulty with transfers from low surfaces like getting out of her car.  Next visit will assess patient's balance utilizing Berg balance scale as well as 10 m walk test in order to assess her mobility at this time secondary to her knee pain and to address her risk of falls as she has had multiple falls within the last year.  Pt will benefit from skilled PT intervention in order to improve LE strength, improve B knee mobility, improve her balance, decrease her falling frequency and to increase her overall mobility.    OBJECTIVE IMPAIRMENTS: Abnormal gait, decreased activity tolerance, decreased balance, decreased endurance, decreased mobility, difficulty walking, decreased ROM, decreased strength, and pain.   ACTIVITY LIMITATIONS: lifting,  bending, standing, squatting, stairs, and transfers  PARTICIPATION LIMITATIONS: meal prep, cleaning, laundry, driving, shopping, community activity,  and yard work  PERSONAL FACTORS: 3+ comorbidities: Obesity,B knee arthritis, HTN,   are also affecting patient's functional outcome.   REHAB POTENTIAL: Fair due to arthrotic changes in the knees.   CLINICAL DECISION MAKING: Stable/uncomplicated  EVALUATION COMPLEXITY: Low   GOALS: Goals reviewed with patient? Yes  SHORT TERM GOALS: Target date: 08/07/2023      Patient will be independent in home exercise program to improve strength/mobility for better functional independence with ADLs. Baseline: No HEP currently  Goal status: INITIAL   LONG TERM GOALS: Target date: 10/02/2023   1.  Patient (> 47 years old) will complete five times sit to stand test in < 15 seconds indicating an increased LE strength and improved balance. Baseline: 18.1 Goal status: INITIAL  2.  Patient will increase FOTO score to equal to or greater than  60   to demonstrate statistically significant improvement in mobility and quality of life.  Baseline: 55 Goal status: INITIAL   3.  Patient will increase Berg Balance score by > 6 points to demonstrate decreased fall risk during functional activities. Baseline: Test at visit 2 Goal status: INITIAL  4.   Patient will increase 10 meter walk test to >1.43m/s as to improve gait speed for better community ambulation and to reduce fall risk. Baseline: Test at visit 2 with multiple assistive devices including rollator and her he cane type cane Goal status: INITIAL  5.  Patient will improve right knee range of motion to 105 degrees or greater in order to allow for improved range of motion for functional activities including getting in and out of the car.  Baseline: 95 deg Goal status: INITIAL     PLAN:  PT FREQUENCY: 2x/week  PT DURATION: 12 weeks  PLANNED INTERVENTIONS: Therapeutic exercises, Therapeutic activity, Neuromuscular re-education, Balance training, Gait training, Patient/Family education, Self Care, Joint mobilization, Joint manipulation,  Stair training, and Manual therapy  PLAN FOR NEXT SESSION: Assess 10 m walk test with multiple assistive devices including her a cane and rollator, assess Berg balance scale for balance.  Provided with initial home exercise program for quad weakness, assess hamstring length and potentially provide exercise for hamstring lengthening activity as well as gastrocnemius for stretching.   Norman Herrlich, PT 07/10/2023, 11:05 AM

## 2023-07-11 ENCOUNTER — Ambulatory Visit
Admission: RE | Admit: 2023-07-11 | Discharge: 2023-07-11 | Disposition: A | Discharge status: Home / Self Care | Payer: 59 | Source: Ambulatory Visit | Attending: Physical Medicine and Rehabilitation | Admitting: Physical Medicine and Rehabilitation

## 2023-07-11 ENCOUNTER — Ambulatory Visit: Admission: RE | Admit: 2023-07-11 | Payer: 59 | Source: Ambulatory Visit

## 2023-07-11 DIAGNOSIS — M25561 Pain in right knee: Secondary | ICD-10-CM | POA: Insufficient documentation

## 2023-07-11 DIAGNOSIS — M25562 Pain in left knee: Secondary | ICD-10-CM | POA: Diagnosis present

## 2023-07-11 DIAGNOSIS — G8929 Other chronic pain: Secondary | ICD-10-CM | POA: Insufficient documentation

## 2023-07-13 ENCOUNTER — Ambulatory Visit: Payer: 59

## 2023-07-13 DIAGNOSIS — M25562 Pain in left knee: Secondary | ICD-10-CM | POA: Diagnosis not present

## 2023-07-13 DIAGNOSIS — R262 Difficulty in walking, not elsewhere classified: Secondary | ICD-10-CM

## 2023-07-13 DIAGNOSIS — M6281 Muscle weakness (generalized): Secondary | ICD-10-CM

## 2023-07-13 DIAGNOSIS — G8929 Other chronic pain: Secondary | ICD-10-CM

## 2023-07-13 NOTE — Therapy (Signed)
OUTPATIENT PHYSICAL THERAPY LOWER EXTREMITY TREATMENT   Patient Name: Heather Sweeney MRN: [id number] DOB:12-29-1965, 57 y.o., female Today's Date: 07/13/2023  END OF SESSION:  PT End of Session - 07/13/23 0807     Visit Number 2    Number of Visits 24    Date for PT Re-Evaluation 09/04/23    Progress Note Due on Visit 10    PT Start Time 0808    PT Stop Time 0844    PT Time Calculation (min) 36 min    Activity Tolerance Patient tolerated treatment well              Past Medical History:  Diagnosis Date   Abnormal MRI, lumbar spine 02/12/2021   Abrasion of anterior left lower leg 07/10/2022   Allergic rhinitis    Annual physical exam 04/20/2020   Anxiety    Aperistalsis, esophagus 10/25/2020   Arthritis of knee, right    Arthritis of right knee 10/25/2020   Asthma    Bacterial vaginosis 10/20/2019   Bipolar disorder (HCC)    BMI 37.0-37.9, adult 03/07/2022   BMI 40.0-44.9, adult (HCC) 06/17/2022   Bronchiectasis without complication (HCC) 04/04/2022   Chronic low back pain    Chronic midline low back pain 12/17/2021   Class 3 severe obesity due to excess calories in adult (HCC) 04/09/2022   CTS (carpal tunnel syndrome)    hands   Depression    Early satiety 02/11/2022   Elevated liver enzymes 08/17/2020   Encounter for screening colonoscopy    Encounter for screening for COVID-19 10/10/2022   Frequent PVCs 09/24/2016   Gallstones    GERD (gastroesophageal reflux disease)    History of prediabetes    Hives 02/23/2019   Hypertension    Hypokalemia 12/13/2021   Incontinence of feces 02/12/2021   Ingrown right big toenail 04/20/2020   Iron deficiency anemia 12/13/2021   Laceration of right foot    Lymphedema 08/24/2022   Obesity (BMI 30-39.9) 09/10/2021   Onychomycosis    OSA on CPAP    does not use cpap   Osteoarthritis of knee 12/17/2021   Overactive bladder    Pain and swelling of right knee 04/20/2020   Pre-diabetes    diet controlled   PVC's  (premature ventricular contractions)    PVC's (premature ventricular contractions)    Dr. Kirtland Bouchard cards   S/P laparoscopic cholecystectomy April 2019 01/12/2018   Seizures (HCC)    8th grade   Tachycardia 10/10/2022   Urticaria 02/23/2019   Weight loss 02/11/2022   Past Surgical History:  Procedure Laterality Date   ABDOMINAL HYSTERECTOMY  07/08/2011   CARPAL TUNNEL RELEASE     right hand 1996   CESAREAN SECTION     2012   CHOLECYSTECTOMY N/A 01/12/2018   Procedure: LAPAROSCOPIC CHOLECYSTECTOMY WITH INTRAOPERATIVE CHOLANGIOGRAM ERAS PATHWAY;  Surgeon: Luretha Murphy, MD;  Location: [facility] ORS;  Service: General;  Laterality: N/A;   COLONOSCOPY WITH PROPOFOL N/A 04/15/2019   Procedure: COLONOSCOPY WITH PROPOFOL;  Surgeon: [name], MD;  Location: [facility] ENDOSCOPY;  Service: Gastroenterology;  Laterality: N/A;   DILATION AND CURETTAGE OF UTERUS     04/2011 benign endometrial polyp squamous metaplasia, inflamm, blood, mucous, benign    ESOPHAGOGASTRODUODENOSCOPY (EGD) WITH PROPOFOL N/A 10/23/2020   Procedure: ESOPHAGOGASTRODUODENOSCOPY (EGD) WITH PROPOFOL;  Surgeon: [name], MD;  Location: [facility] ENDOSCOPY;  Service: Gastroenterology;  Laterality: N/A;   FLEXIBLE SIGMOIDOSCOPY N/A 02/15/2021   Procedure: FLEXIBLE SIGMOIDOSCOPY;  Surgeon: [name], MD;  Location: [facility] ENDOSCOPY;  Service: Gastroenterology;  Laterality: N/A;   FOOT SURGERY     Dr. Stacie Acres    LEEP     2006   PANNICULECTOMY N/A 04/24/2022   Procedure: INFRAUMBILICAL PANNICULECTOMY;  Surgeon: Peggye Form, DO;  Location: [facility] OR;  Service: Plastics;  Laterality: N/A;   Right hand carpal tunnel release Right 10/2022   TUBAL LIGATION     Patient Active Problem List   Diagnosis Date Noted   Smoking history 11/05/2022   High serum vitamin B12 10/25/2022   Lymphedema 08/24/2022   Panniculitis 04/22/2022   Stage 3a chronic kidney disease (HCC) 03/16/2022   Chronic idiopathic urticaria 12/12/2021   Intertrigo 02/12/2021    Insomnia 10/11/2020   Arthritis of both knees 06/08/2020   Gastroesophageal reflux disease 04/20/2020   Mitral valve insufficiency 01/10/2020   Vitamin D deficiency 02/01/2019   Mood disorder (HCC) 12/24/2018   HLD (hyperlipidemia) 09/01/2018   History of prediabetes 09/01/2018   Chronic pain 08/31/2018   Allergic rhinitis 08/31/2018   Lumbosacral radiculitis 02/19/2018   Angioedema 07/07/2017   Bilateral carpal tunnel syndrome 11/03/2016   Morbid obesity with BMI of 40.0-44.9, adult (HCC) 08/07/2016   Essential hypertension 07/10/2016   OSA on CPAP 07/10/2016    PCP: Louis Matte, MD  REFERRING PROVIDER:   Horton Chin, MD    REFERRING DIAG: M25.561,M25.562,G89.29 (ICD-10-CM) - Chronic pain of both knees   THERAPY DIAG:  Chronic pain of left knee  Chronic pain of right knee  Difficulty in walking, not elsewhere classified  Muscle weakness (generalized)  Rationale for Evaluation and Treatment: Rehabilitation  ONSET DATE: 07/07/2015  SUBJECTIVE:   SUBJECTIVE STATEMENT: Patient arrived late, brought her broken knee brace.   PERTINENT HISTORY: Patient has history of obesity, bilateral knee osteoarthritis, hypertension. Pt has had multiple falls in the last 6 months. Pt had imaging done following this and she has OA in the joint ( tricompartmental). Pt has had knee pain for the last 8 years. Pt reports the R knee pain is worse with food left knee rated.  Patient ambulates with rollator for longer distances and utilizes a hurry cane for shorter distances. PAIN:  Are you having pain? Yes: NPRS scale: 8/10 Pain location: R > L knee  Pain description: sharp pain Aggravating factors: getting up from a chair  Relieving factors: rest   PRECAUTIONS: Fall  RED FLAGS: None   WEIGHT BEARING RESTRICTIONS: No  FALLS:  Has patient fallen in last 6 months? Yes. Number of falls 5 fell 2 times in parking lot when she hurt her knees  LIVING  ENVIRONMENT: Lives with: lives with their family and lives with their spouse Lives in: House/apartment Stairs: Yes: External: 2 steps; on right going up Has following equipment at home: Single point cane and Walker - 4 wheeled  OCCUPATION: Disabled  PLOF: Independent with household mobility with device, Independent with community mobility with device, and Needs assistance with transfers  PATIENT GOALS: Improve knee pain and mobility  NEXT MD VISIT: 07/27/23  OBJECTIVE:  Note: Objective measures were completed at Evaluation unless otherwise noted.  DIAGNOSTIC FINDINGS: Tricompartmental osteoarthritis noted in most previous imaging per imaging report  PATIENT SURVEYS:  FOTO 55  COGNITION: Overall cognitive status: Within functional limits for tasks assessed     SENSATION: WFL  EDEMA:  Clear edema noted on right lower extremity on medial side of knee.  Patient does have bilateral edema noted which may be due to lymphedema as noted in history  POSTURE:  Flexed posture and gait with no suitable flexion in the hip  PALPATION: TTP along hamstring, patellar and quad tendon and all area surrounding kneecap   LOWER EXTREMITY ROM:  Active ROM Right eval Left eval  Hip flexion    Hip extension    Hip abduction    Hip adduction    Hip internal rotation    Hip external rotation    Knee flexion 97 105  Knee extension 0 0  Ankle dorsiflexion    Ankle plantarflexion    Ankle inversion    Ankle eversion     (Blank rows = not tested)  LOWER EXTREMITY MMT:  MMT Right eval Left eval  Hip flexion 4 4  Hip extension    Hip abduction    Hip adduction    Hip internal rotation    Hip external rotation    Knee flexion 3+* 4  Knee extension 3+* 4  Ankle dorsiflexion 5 5  Ankle plantarflexion 5 5  Ankle inversion    Ankle eversion     (Blank rows = not tested)    FUNCTIONAL TESTS:  5 times sit to stand: 18.1 sec  GAIT: Distance walked: 39M Assistive device  utilized: Environmental consultant - 4 wheeled Level of assistance: Modified independence Comments: flexed posture (hip flexion) throughout with heavy lean on rollator    TODAY'S TREATMENT:                                                                                                                              Continuation of evaluation: 10 MWT with rollator: 15.81 seconds  with cane: 14.67 seconds BERG: 35  Review HEP as seen below:  Access Code: [email or web address] URL: [email or web address] Date: 07/13/2023 Prepared by: Precious Bard  Exercises - Leg Extension  - 1 x daily - 7 x weekly - 2 sets - 10 reps - 5 hold - Seated Hamstring Stretch  - 1 x daily - 7 x weekly - 2 sets - 2 reps - 30 hold - Seated Ankle Alphabet  - 1 x daily - 7 x weekly - 2 sets - 10 reps - 5 hold  PATIENT EDUCATION:  Education details: POC Person educated: Patient Education method: Explanation Education comprehension: verbalized understanding   HOME EXERCISE PROGRAM: Access Code: [email or web address] URL: [email or web address] Date: 07/13/2023 Prepared by: Precious Bard  Exercises - Leg Extension  - 1 x daily - 7 x weekly - 2 sets - 10 reps - 5 hold - Seated Hamstring Stretch  - 1 x daily - 7 x weekly - 2 sets - 2 reps - 30 hold - Seated Ankle Alphabet  - 1 x daily - 7 x weekly - 2 sets - 10 reps - 5 hold    CLINICAL IMPRESSION: Patient's HEP initiated with patient demonstrating understanding. She is highly motivated throughout session. She has bilateral knee buckling during session in static standing. Goals performed with noticeable fatigue of LE's with ambulation and significant stability  concerns as can be seen in BERG test.   Pt will benefit from skilled PT intervention in order to improve LE strength, improve B knee mobility, improve her balance, decrease her falling frequency and to increase her overall mobility.    OBJECTIVE IMPAIRMENTS: Abnormal gait, decreased activity tolerance, decreased balance,  decreased endurance, decreased mobility, difficulty walking, decreased ROM, decreased strength, and pain.   ACTIVITY LIMITATIONS: lifting, bending, standing, squatting, stairs, and transfers  PARTICIPATION LIMITATIONS: meal prep, cleaning, laundry, driving, shopping, community activity, and yard work  PERSONAL FACTORS: 3+ comorbidities: Obesity,B knee arthritis, HTN,   are also affecting patient's functional outcome.   REHAB POTENTIAL: Fair due to arthrotic changes in the knees.   CLINICAL DECISION MAKING: Stable/uncomplicated  EVALUATION COMPLEXITY: Low   GOALS: Goals reviewed with patient? Yes  SHORT TERM GOALS: Target date: 08/07/2023      Patient will be independent in home exercise program to improve strength/mobility for better functional independence with ADLs. Baseline: No HEP currently  Goal status: INITIAL   LONG TERM GOALS: Target date: 10/02/2023   1.  Patient (> 30 years old) will complete five times sit to stand test in < 15 seconds indicating an increased LE strength and improved balance. Baseline: 18.1 Goal status: INITIAL  2.  Patient will increase FOTO score to equal to or greater than  60   to demonstrate statistically significant improvement in mobility and quality of life.  Baseline: 55 Goal status: INITIAL   3.  Patient will increase Berg Balance score by > 6 points to demonstrate decreased fall risk during functional activities. Baseline: BERG 35/56 Goal status: INITIAL  4.   Patient will increase 10 meter walk test to >1.57m/s as to improve gait speed for better community ambulation and to reduce fall risk. Baseline: Test at visit 2 with multiple assistive devices including rollator and her he cane type cane 10/7: with rollator 15.81 seconds and with SPC 14.46 seconds Goal status: INITIAL  5.  Patient will improve right knee range of motion to 105 degrees or greater in order to allow for improved range of motion for functional activities including  getting in and out of the car.  Baseline: 95 deg Goal status: INITIAL     PLAN:  PT FREQUENCY: 2x/week  PT DURATION: 12 weeks  PLANNED INTERVENTIONS: Therapeutic exercises, Therapeutic activity, Neuromuscular re-education, Balance training, Gait training, Patient/Family education, Self Care, Joint mobilization, Joint manipulation, Stair training, and Manual therapy  PLAN FOR NEXT SESSION: Assess 10 m walk test with multiple assistive devices including her a cane and rollator, assess Berg balance scale for balance.  Provided with initial home exercise program for quad weakness, assess hamstring length and potentially provide exercise for hamstring lengthening activity as well as gastrocnemius for stretching.   Precious Bard, PT 07/13/2023, 8:44 AM

## 2023-07-13 NOTE — Unmapped (Unsigned)
Otolaryngology New Consultation Visit Note      Reason for Visit:  OSA - does not tolerate CPAP    History of Present Illness    Renee Cunningham is 57 y.o. female being seen in consultation at the request of Dr. ElenorElenore Rotata for evaluation of OSA.     I personally reviewed PCP note   Referral to eval for Inspire placed. No recent PSG.     Gasping/choking at night or stopping breathing:   Daytime fatigue:  Morning headaches:  Frequent naps:  Falling asleep at inappropriate times:  Blood pressure issues:  Difficultly breathing out of nose:  Sleep study:   CPAP use:  Mask that patient has tried  Compliance data   Other sleep surgery or oral appliance:  Lung or heart issues:  Bleeding issues or blood thinners:  Problems with GA:    The patient denies fevers, chills, shortness of breath, chest pain, nausea, vomiting, diarrhea, inability to lie flat, dysphagia, odynophagia, hemoptysis, hematemesis, changes in vision, changes in voice quality, otalgia, otorrhea, vertiginous symptoms, focal deficits, or other concerning symptoms.    Past Medical History     has a past medical history of Anxiety and Depression.    Past Surgical History     has a past surgical history that includes Hysterectomy (N/A, 2012).    Current Medications    Current Outpatient Medications   Medication Sig Dispense Refill    ALCOHOL PADS PadM Use as directed,once daily to test blood sugar      amLODIPine (NORVASC) 5 MG tablet       benztropine (COGENTIN) 1 MG tablet Take 1 tablet (1 mg total) by mouth daily.      cetirizine (ZYRTEC) 10 MG tablet TAKE 2 TABLETS BY MOUTH ONCE DAILY AT NIGHT 60 tablet 0    clobetasoL (TEMOVATE) 0.05 % cream Apply 1-2 times a day to the raised itchy spots on your body as needed . 30 g 0    clotrimazole (LOTRIMIN) 1 % cream APPLY TOPICALLY IN THE MORNING AND AT BEDTIME      clotrimazole-betamethasone (LOTRISONE) 1-0.05 % cream APPLY EXTERNALLY TWICE DAILY AS NEEDED FOR SYMPTOMS FOR UP TO 2 WEEKS clotrimazole-betamethasone (LOTRISONE) 1-0.05 % cream Apply externally BID prn sx up to 2 wks      empty container Misc Use as directed to dispose of Xolair syringes 1 each 3    EPINEPHrine (EPIPEN) 0.3 mg/0.3 mL injection Inject 0.3 mL (0.3 mg total) into the muscle once for 1 dose. May repeat in 5 minutes if symptoms are not improved or worsening. 1 each PRN    famotidine (PEPCID) 20 MG tablet 1 tablet (20 mg total) two (2) times a day.      ferrous sulfate 325 (65 FE) MG tablet Take 1 tablet (325 mg total) by mouth in the morning.      FLUoxetine (PROZAC) 10 MG capsule TAKE 1 CAPSULE BY MOUTH IN THE MORNING      fluPHENAZine (PROLIXIN) 2.5 MG tablet Take 1 tablet (2.5 mg total) by mouth daily.      fluticasone propionate (FLONASE) 50 mcg/actuation nasal spray Place 2 sprays into both nostrils daily.      hydrocortisone 2.5 % lotion Apply 1-2 times a day to the raised itchy spots as needed on your face 59 mL 0    hydrOXYchloroQUINE (PLAQUENIL) 200 mg tablet Take 1 tablet (200 mg total) by mouth Two (2) times a day. 60 tablet 12    melatonin 5 mg Tab  Take 1 tablet (5 mg total) by mouth nightly.      metoprolol succinate (TOPROL-XL) 25 MG 24 hr tablet       montelukast (SINGULAIR) 10 mg tablet Take 1 tablet (10 mg total) by mouth nightly. 90 tablet 3    mupirocin (BACTROBAN) 2 % ointment       omalizumab (XOLAIR) 150 mg/mL syringe Inject the contents of 2 syringes (300 mg total) under the skin every twenty-one (21) days. 2 mL 12    ONETOUCH ULTRA TEST Strp Use as directed,once daily to test blood sugar      oxyCODONE-acetaminophen (PERCOCET) 10-325 mg per tablet TAKE 1 TABLET BY MOUTH THREE TIMES DAILY AS NEEDED FOR CHRONIC PAIN      topiramate (TOPAMAX) 100 MG tablet TAKE 1 & 1/2 (ONE & ONE-HALF) TABLETS BY MOUTH IN THE EVENING      triamcinolone (KENALOG) 0.1 % cream Apply topically to affected areas twice daily as needed for rash. 454 g 2    triamcinolone (KENALOG) 0.1 % ointment Apply twice a day as needed only, only to areas with active hives breakouts. 80 g 6    VICTOZA 3-PAK 0.6 mg/0.1 mL (18 mg/3 mL) injection Inject 1.8 mg into the skin daily.       No current facility-administered medications for this visit.       Allergies    Allergies   Allergen Reactions    Cephalexin Rash    Hydrochlorothiazide Swelling     Mild she says    Losartan Hives and Swelling     Angioedema  Angioedema      Penicillins Rash    Metformin      Rash  Other reaction(s): Other (See Comments)  Rash    Venom-Honey Bee      Other reaction(s): Other (See Comments)  Swelling  Swelling      Lisinopril Other (See Comments)     Low BP       Family History    family history includes Cancer in her father and sister.    Social History:     reports that she has never smoked. She quit smokeless tobacco use about 11 years ago.  Her smokeless tobacco use included snuff.   reports no history of alcohol use.   reports no history of drug use.    Review of Systems    A full 12-system review of systems is documented and negative except as noted in HPI.  Patient intake form reviewed.    Vital Signs  not currently breastfeeding.    Physical Exam    General: Well-developed, well-nourished. Appropriate, comfortable, and in no apparent distress.  Voice: clear   Head/Face: On external examination there is no obvious asymmetry or scars. On palpation there is no masses within the salivary glands. Cranial nerves V and VII are intact through all distributions.  Eyes: PERRL, EOMI, the conjunctiva are not injected and sclera is non-icteric.  Ears:   Right ear: On external exam, there is no obvious lesions or asymmetry. No cerumen or lesions in the EAC. The TM is in the neutral position and mobile to pneumatic otoscopy. No middle ear masses or fluid noted.   Hearing is grossly intact.  Left ear: On external exam, there is no obvious lesions or asymmetry. No cerumen or lesions in the EAC. The TM is in the neutral position and mobile to pneumatic otoscopy. No middle ear masses or fluid noted.   Hearing is grossly intact.  Nose: No external  masses, lesions, or deformity. Anterior rhinoscopy of the nasal mucosa, septum, and turbinates reveals a normal exam.   Oral cavity/oropharynx: The mucosa of the lips, gums, hard and soft palate, posterior pharyngeal wall, tongue, floor of mouth, and buccal region are without masses or lesions and are normally hydrated. Good dentition. Tongue protrudes midline. Tonsils are symmetric without any masses or lesions. Supraglottis not visualized due to gag reflex. Freidman ***  Neck: There is no asymmetry or masses. Trachea is midline. There is no enlargement of the thyroid or palpable thyroid nodules.   Lymphatics: There is no palpable lymphadenopathy along the jugulodiagastric, submental, or posterior cervical chains.  Chest: No audible wheeze, unlabored respirations.  Neurologic: Cranial nerve???s II-XII are grossly intact. Exam is non-focal.  Extremities: No cyanosis, clubbing or edema.    Procedures:  Flexible Fiberoptic Nasopharyngolaryngoscopy (CPT [id number]): To better evaluate the patient???s symptoms, fiberoptic laryngoscopy is indicated.  After discussion of risks and benefits, and topical decongestion and anesthesia, the endoscopy was performed without complications. A time out identifying the patient, the procedure, the location of the procedure and any concerns was performed prior to beginning the procedure.    Findings:  Absent adenoidal pad. Bilateral patent Eustachian tube orifices. Healthy, symmetric nasopharyngeal and pharyngeal mucosa with no masses, lesions, or friable mucosa. Symmetric base of tongue with clear vallecula bilaterally. No lingual tonsil hypertrophy.    Inspection of the larynx reveals no evidence of supraglottic or glottic masses. Epiglottis crisp. There is bilateral true vocal cord full range of motion with glottic competence. The piriform sinuses are clear bilaterally. No pooling of secretions in the piriform sinuses. Outside Medical Records  I personally reviewed patient's medical records from the referring provider.     Assessment/Recommendations:    The patient is a 57 y.o. female with a history of  has a past medical history of Anxiety and Depression. who presents for the evaluation of OSA. Patient does not tolerate CPAP.      The pathophysiology and treatment of adult obstructive sleep apnea were discussed in detail. We discussed that untreated obstructive sleep apnea is associated with significant morbidity, mortality, and decreased quality of life. We discussed that in general, adult obstructive sleep apnea is best treated by CPAP, with a dental appliance/mandibular advancement device as a second line of therapy for patients with mild OSA and surgery for mild-severe OSA. We also discussed the importance of weight loss in controlling obstructive sleep apnea.    Surgery for adult obstructive sleep apnea is typically reserved for inability to tolerate a prolonged trial of these treatments, or when a specific, substantial level of anatomic obstruction is identified. We discussed that in many cases sleep surgery may result in improvement in quality of life and AHI, but sleep apnea may persist and require ongoing medical treatment. Sleep surgery may assist in making the CPAP mask more tolerable and potentially lowering positive airway pressures.     Discussed that there are different types of surgeries such as UPPP, TORS, Tonsillectomy, bariatric surgery (N/A), MMA, hyoid suspension and hypoglossal nerve implant.      Given patient's anatomy and nasopharyngeal exam, patient would likely benefit from ***.     Recommend patient have a sleep endoscopy (DISE) to evaluate the airway while asleep to see what types of surgery will be most beneficial for patient's OSA. The risk, benefits and alternatives to DISE were discussed with patient including but not limited to bleeding, airway compromise and the risk of GA. Patient wishes to proceed. Will call  scheduler to schedule.       The patient voiced complete understanding of plan as detailed above and is in full agreement.

## 2023-07-24 ENCOUNTER — Ambulatory Visit: Payer: 59 | Admitting: Physical Therapy

## 2023-07-27 ENCOUNTER — Encounter: Payer: 59 | Admitting: Physical Medicine and Rehabilitation

## 2023-07-27 ENCOUNTER — Ambulatory Visit: Admit: 2023-07-27 | Payer: MEDICARE

## 2023-07-27 NOTE — Unmapped (Unsigned)
Otolaryngology New Consultation Visit Note    Reason for Visit:  OSA - does not tolerate CPAP    History of Present Illness    ROSALIE Cunningham is 57 y.o. female being seen in consultation at the request of Dr. Elenore Rota for evaluation of OSA.     I personally reviewed PCP note   Referral to eval for [facility] placed. No recent PSG.     The patient denies fevers, chills, shortness of breath, chest pain, nausea, vomiting, diarrhea, inability to lie flat, dysphagia, odynophagia, hemoptysis, hematemesis, changes in vision, changes in voice quality, otalgia, otorrhea, vertiginous symptoms, focal deficits, or other concerning symptoms.    Past Medical History     has a past medical history of Anxiety and Depression.    Past Surgical History     has a past surgical history that includes Hysterectomy (N/A, 2012).    Current Medications    Current Outpatient Medications   Medication Sig Dispense Refill    ALCOHOL PADS PadM Use as directed,once daily to test blood sugar      amLODIPine (NORVASC) 5 MG tablet       benztropine (COGENTIN) 1 MG tablet Take 1 tablet (1 mg total) by mouth daily.      cetirizine (ZYRTEC) 10 MG tablet TAKE 2 TABLETS BY MOUTH ONCE DAILY AT NIGHT 60 tablet 0    clobetasoL (TEMOVATE) 0.05 % cream Apply 1-2 times a day to the raised itchy spots on your body as needed . 30 g 0    clotrimazole (LOTRIMIN) 1 % cream APPLY TOPICALLY IN THE MORNING AND AT BEDTIME      clotrimazole-betamethasone (LOTRISONE) 1-0.05 % cream APPLY EXTERNALLY TWICE DAILY AS NEEDED FOR SYMPTOMS FOR UP TO 2 WEEKS      clotrimazole-betamethasone (LOTRISONE) 1-0.05 % cream Apply externally BID prn sx up to 2 wks      empty container Misc Use as directed to dispose of Xolair syringes 1 each 3    EPINEPHrine (EPIPEN) 0.3 mg/0.3 mL injection Inject 0.3 mL (0.3 mg total) into the muscle once for 1 dose. May repeat in 5 minutes if symptoms are not improved or worsening. 1 each PRN    famotidine (PEPCID) 20 MG tablet 1 tablet (20 mg total) two (2) times a day.      ferrous sulfate 325 (65 FE) MG tablet Take 1 tablet (325 mg total) by mouth in the morning.      FLUoxetine (PROZAC) 10 MG capsule TAKE 1 CAPSULE BY MOUTH IN THE MORNING      fluPHENAZine (PROLIXIN) 2.5 MG tablet Take 1 tablet (2.5 mg total) by mouth daily.      fluticasone propionate (FLONASE) 50 mcg/actuation nasal spray Place 2 sprays into both nostrils daily.      hydrocortisone 2.5 % lotion Apply 1-2 times a day to the raised itchy spots as needed on your face 59 mL 0    hydrOXYchloroQUINE (PLAQUENIL) 200 mg tablet Take 1 tablet (200 mg total) by mouth Two (2) times a day. 60 tablet 12    melatonin 5 mg Tab Take 1 tablet (5 mg total) by mouth nightly.      metoprolol succinate (TOPROL-XL) 25 MG 24 hr tablet       montelukast (SINGULAIR) 10 mg tablet Take 1 tablet (10 mg total) by mouth nightly. 90 tablet 3    mupirocin (BACTROBAN) 2 % ointment       omalizumab (XOLAIR) 150 mg/mL syringe Inject the contents of 2 syringes (300  mg total) under the skin every twenty-one (21) days. 2 mL 12    ONETOUCH ULTRA TEST Strp Use as directed,once daily to test blood sugar      oxyCODONE-acetaminophen (PERCOCET) 10-325 mg per tablet TAKE 1 TABLET BY MOUTH THREE TIMES DAILY AS NEEDED FOR CHRONIC PAIN      topiramate (TOPAMAX) 100 MG tablet TAKE 1 & 1/2 (ONE & ONE-HALF) TABLETS BY MOUTH IN THE EVENING      triamcinolone (KENALOG) 0.1 % cream Apply topically to affected areas twice daily as needed for rash. 454 g 2    triamcinolone (KENALOG) 0.1 % ointment Apply twice a day as needed only, only to areas with active hives breakouts. 80 g 6    VICTOZA 3-PAK 0.6 mg/0.1 mL (18 mg/3 mL) injection Inject 1.8 mg into the skin daily.       No current facility-administered medications for this visit.       Allergies    Allergies   Allergen Reactions    Cephalexin Rash    Hydrochlorothiazide Swelling     Mild she says    Losartan Hives and Swelling     Angioedema  Angioedema      Penicillins Rash    Metformin Rash  Other reaction(s): Other (See Comments)  Rash    Venom-Honey Bee      Other reaction(s): Other (See Comments)  Swelling  Swelling      Lisinopril Other (See Comments)     Low BP       Family History    family history includes Cancer in her father and sister.    Social History:     reports that she has never smoked. She quit smokeless tobacco use about 11 years ago.  Her smokeless tobacco use included snuff.   reports no history of alcohol use.   reports no history of drug use.    Review of Systems    A full 12-system review of systems is documented and negative except as noted in HPI.  Patient intake form reviewed.    Vital Signs  not currently breastfeeding.    Physical Exam    General: Well-developed, well-nourished. Appropriate, comfortable, and in no apparent distress.  Voice: clear   Head/Face: On external examination there is no obvious asymmetry or scars. On palpation there is no masses within the salivary glands. Cranial nerves V and VII are intact through all distributions.  Eyes: PERRL, EOMI, the conjunctiva are not injected and sclera is non-icteric.  Ears:   Right ear: On external exam, there is no obvious lesions or asymmetry. No cerumen or lesions in the EAC. The TM is in the neutral position and mobile to pneumatic otoscopy. No middle ear masses or fluid noted.   Hearing is grossly intact.  Left ear: On external exam, there is no obvious lesions or asymmetry. No cerumen or lesions in the EAC. The TM is in the neutral position and mobile to pneumatic otoscopy. No middle ear masses or fluid noted.   Hearing is grossly intact.  Nose: No external masses, lesions, or deformity. Anterior rhinoscopy of the nasal mucosa, septum, and turbinates reveals a normal exam.   Oral cavity/oropharynx: The mucosa of the lips, gums, hard and soft palate, posterior pharyngeal wall, tongue, floor of mouth, and buccal region are without masses or lesions and are normally hydrated. Good dentition. Tongue protrudes midline. Tonsils are symmetric without any masses or lesions. Supraglottis not visualized due to gag reflex. Freidman ***  Neck: There is  no asymmetry or masses. Trachea is midline. There is no enlargement of the thyroid or palpable thyroid nodules.   Lymphatics: There is no palpable lymphadenopathy along the jugulodiagastric, submental, or posterior cervical chains.  Chest: No audible wheeze, unlabored respirations.  Neurologic: Cranial nerve???s II-XII are grossly intact. Exam is non-focal.  Extremities: No cyanosis, clubbing or edema.    Procedures:  Flexible Fiberoptic Nasopharyngolaryngoscopy (CPT [id number]): To better evaluate the patient???s symptoms, fiberoptic laryngoscopy is indicated.  After discussion of risks and benefits, and topical decongestion and anesthesia, the endoscopy was performed without complications. A time out identifying the patient, the procedure, the location of the procedure and any concerns was performed prior to beginning the procedure.    Findings:  Absent adenoidal pad. Bilateral patent Eustachian tube orifices. Healthy, symmetric nasopharyngeal and pharyngeal mucosa with no masses, lesions, or friable mucosa. Symmetric base of tongue with clear vallecula bilaterally. No lingual tonsil hypertrophy.    Inspection of the larynx reveals no evidence of supraglottic or glottic masses. Epiglottis crisp. There is bilateral true vocal cord full range of motion with glottic competence. The piriform sinuses are clear bilaterally. No pooling of secretions in the piriform sinuses.     Outside Medical Records  I personally reviewed patient's medical records from the referring provider.     Assessment/Recommendations:    The patient is a 57 y.o. female with a history of  has a past medical history of Anxiety and Depression. who presents for the evaluation of OSA. Patient does not tolerate CPAP.      The pathophysiology and treatment of adult obstructive sleep apnea were discussed in detail. We discussed that untreated obstructive sleep apnea is associated with significant morbidity, mortality, and decreased quality of life. We discussed that in general, adult obstructive sleep apnea is best treated by CPAP, with a dental appliance/mandibular advancement device as a second line of therapy for patients with mild OSA and surgery for mild-severe OSA. We also discussed the importance of weight loss in controlling obstructive sleep apnea.    Surgery for adult obstructive sleep apnea is typically reserved for inability to tolerate a prolonged trial of these treatments, or when a specific, substantial level of anatomic obstruction is identified. We discussed that in many cases sleep surgery may result in improvement in quality of life and AHI, but sleep apnea may persist and require ongoing medical treatment. Sleep surgery may assist in making the CPAP mask more tolerable and potentially lowering positive airway pressures.     Discussed that there are different types of surgeries such as UPPP, TORS, Tonsillectomy, bariatric surgery (N/A), MMA, hyoid suspension and hypoglossal nerve implant.      Given patient's anatomy and nasopharyngeal exam, patient would likely benefit from ***.     Recommend patient have a sleep endoscopy (DISE) to evaluate the airway while asleep to see what types of surgery will be most beneficial for patient's OSA. The risk, benefits and alternatives to DISE were discussed with patient including but not limited to bleeding, airway compromise and the risk of GA. Patient wishes to proceed. Will call scheduler to schedule.       The patient voiced complete understanding of plan as detailed above and is in full agreement.    Scribe's Attestation: Darliss Ridgel, MD obtained and performed the history, physical exam and medical decision making elements that were entered into the chart. Signed by Donzetta Starch, Scribe, on July 27, 2023 at 1:28 PM.

## 2023-07-31 ENCOUNTER — Other Ambulatory Visit (HOSPITAL_COMMUNITY): Payer: Self-pay

## 2023-07-31 ENCOUNTER — Ambulatory Visit: Payer: 59 | Admitting: Physical Therapy

## 2023-07-31 ENCOUNTER — Telehealth: Payer: Self-pay | Admitting: Physical Therapy

## 2023-07-31 NOTE — Telephone Encounter (Signed)
Patient contacted via mobile phone and informed of missed appointment on 07/31/2023.  Patient also informed of next appointment time being next Wednesday at 2 PM and was also informed of attendance policy and result of no-shows.  Patient is apologetic for missing appointment and explains that she had to watch her grandchild at the last minute due to an illness and she forgot to call.  Patient is eager to return to therapy next week.  Norman Herrlich  ,DPT Physical Therapist- [place]  [facility]

## 2023-08-04 NOTE — Therapy (Signed)
OUTPATIENT PHYSICAL THERAPY LOWER EXTREMITY TREATMENT   Patient Name: Heather Sweeney MRN: [id number] DOB:1965/11/18, 57 y.o., female Today's Date: 08/05/2023  END OF SESSION:  PT End of Session - 08/05/23 1400     Visit Number 3    Number of Visits 24    Date for PT Re-Evaluation 09/04/23    PT Start Time 1400    PT Stop Time 1444    PT Time Calculation (min) 44 min    Equipment Utilized During Treatment Gait belt    Activity Tolerance Patient tolerated treatment well    Behavior During Therapy WFL for tasks assessed/performed               Past Medical History:  Diagnosis Date   Abnormal MRI, lumbar spine 02/12/2021   Abrasion of anterior left lower leg 07/10/2022   Allergic rhinitis    Annual physical exam 04/20/2020   Anxiety    Aperistalsis, esophagus 10/25/2020   Arthritis of knee, right    Arthritis of right knee 10/25/2020   Asthma    Bacterial vaginosis 10/20/2019   Bipolar disorder (HCC)    BMI 37.0-37.9, adult 03/07/2022   BMI 40.0-44.9, adult (HCC) 06/17/2022   Bronchiectasis without complication (HCC) 04/04/2022   Chronic low back pain    Chronic midline low back pain 12/17/2021   Class 3 severe obesity due to excess calories in adult (HCC) 04/09/2022   CTS (carpal tunnel syndrome)    hands   Depression    Early satiety 02/11/2022   Elevated liver enzymes 08/17/2020   Encounter for screening colonoscopy    Encounter for screening for COVID-19 10/10/2022   Frequent PVCs 09/24/2016   Gallstones    GERD (gastroesophageal reflux disease)    History of prediabetes    Hives 02/23/2019   Hypertension    Hypokalemia 12/13/2021   Incontinence of feces 02/12/2021   Ingrown right big toenail 04/20/2020   Iron deficiency anemia 12/13/2021   Laceration of right foot    Lymphedema 08/24/2022   Obesity (BMI 30-39.9) 09/10/2021   Onychomycosis    OSA on CPAP    does not use cpap   Osteoarthritis of knee 12/17/2021   Overactive bladder    Pain and  swelling of right knee 04/20/2020   Pre-diabetes    diet controlled   PVC's (premature ventricular contractions)    PVC's (premature ventricular contractions)    Dr. Kirtland Bouchard cards   S/P laparoscopic cholecystectomy April 2019 01/12/2018   Seizures (HCC)    8th grade   Tachycardia 10/10/2022   Urticaria 02/23/2019   Weight loss 02/11/2022   Past Surgical History:  Procedure Laterality Date   ABDOMINAL HYSTERECTOMY  07/08/2011   CARPAL TUNNEL RELEASE     right hand 1996   CESAREAN SECTION     2012   CHOLECYSTECTOMY N/A 01/12/2018   Procedure: LAPAROSCOPIC CHOLECYSTECTOMY WITH INTRAOPERATIVE CHOLANGIOGRAM ERAS PATHWAY;  Surgeon: Luretha Murphy, MD;  Location: [facility] ORS;  Service: General;  Laterality: N/A;   COLONOSCOPY WITH PROPOFOL N/A 04/15/2019   Procedure: COLONOSCOPY WITH PROPOFOL;  Surgeon: [name], MD;  Location: [facility] ENDOSCOPY;  Service: Gastroenterology;  Laterality: N/A;   DILATION AND CURETTAGE OF UTERUS     04/2011 benign endometrial polyp squamous metaplasia, inflamm, blood, mucous, benign    ESOPHAGOGASTRODUODENOSCOPY (EGD) WITH PROPOFOL N/A 10/23/2020   Procedure: ESOPHAGOGASTRODUODENOSCOPY (EGD) WITH PROPOFOL;  Surgeon: [name], MD;  Location: [facility] ENDOSCOPY;  Service: Gastroenterology;  Laterality: N/A;   FLEXIBLE SIGMOIDOSCOPY N/A 02/15/2021   Procedure:  FLEXIBLE SIGMOIDOSCOPY;  Surgeon: [name], MD;  Location: [facility] ENDOSCOPY;  Service: Gastroenterology;  Laterality: N/A;   FOOT SURGERY     Dr. Stacie Acres    LEEP     2006   PANNICULECTOMY N/A 04/24/2022   Procedure: INFRAUMBILICAL PANNICULECTOMY;  Surgeon: Peggye Form, DO;  Location: [facility] OR;  Service: Plastics;  Laterality: N/A;   Right hand carpal tunnel release Right 10/2022   TUBAL LIGATION     Patient Active Problem List   Diagnosis Date Noted   Smoking history 11/05/2022   High serum vitamin B12 10/25/2022   Lymphedema 08/24/2022   Panniculitis 04/22/2022   Stage 3a chronic kidney disease (HCC)  03/16/2022   Chronic idiopathic urticaria 12/12/2021   Intertrigo 02/12/2021   Insomnia 10/11/2020   Arthritis of both knees 06/08/2020   Gastroesophageal reflux disease 04/20/2020   Mitral valve insufficiency 01/10/2020   Vitamin D deficiency 02/01/2019   Mood disorder (HCC) 12/24/2018   HLD (hyperlipidemia) 09/01/2018   History of prediabetes 09/01/2018   Chronic pain 08/31/2018   Allergic rhinitis 08/31/2018   Lumbosacral radiculitis 02/19/2018   Angioedema 07/07/2017   Bilateral carpal tunnel syndrome 11/03/2016   Morbid obesity with BMI of 40.0-44.9, adult (HCC) 08/07/2016   Essential hypertension 07/10/2016   OSA on CPAP 07/10/2016    PCP: Louis Matte, MD  REFERRING PROVIDER:   Horton Chin, MD    REFERRING DIAG: M25.561,M25.562,G89.29 (ICD-10-CM) - Chronic pain of both knees   THERAPY DIAG:  Chronic pain of left knee  Chronic pain of right knee  Difficulty in walking, not elsewhere classified  Abnormality of gait and mobility  Muscle weakness (generalized)  Rationale for Evaluation and Treatment: Rehabilitation  ONSET DATE: 07/07/2015  SUBJECTIVE:   SUBJECTIVE STATEMENT: Patient reports she noticed a knot on her right knee recently and pain is a 10/10. She expressed she noticed some edema. Patient states she has been compliant with her HEP. There have been no changes to her medication and no falls since last visit.   PERTINENT HISTORY: Patient has history of obesity, bilateral knee osteoarthritis, hypertension. Pt has had multiple falls in the last 6 months. Pt had imaging done following this and she has OA in the joint ( tricompartmental). Pt has had knee pain for the last 8 years. Pt reports the R knee pain is worse with food left knee rated.  Patient ambulates with rollator for longer distances and utilizes a hurry cane for shorter distances. PAIN:  Are you having pain? Yes: NPRS scale: 8/10 Pain location: R > L knee  Pain description:  sharp pain Aggravating factors: getting up from a chair  Relieving factors: rest   PRECAUTIONS: Fall  RED FLAGS: None   WEIGHT BEARING RESTRICTIONS: No  FALLS:  Has patient fallen in last 6 months? Yes. Number of falls 5 fell 2 times in parking lot when she hurt her knees  LIVING ENVIRONMENT: Lives with: lives with their family and lives with their spouse Lives in: House/apartment Stairs: Yes: External: 2 steps; on right going up Has following equipment at home: Single point cane and Broderic Bara - 4 wheeled  OCCUPATION: Disabled  PLOF: Independent with household mobility with device, Independent with community mobility with device, and Needs assistance with transfers  PATIENT GOALS: Improve knee pain and mobility  NEXT MD VISIT: 07/27/23  OBJECTIVE:  Note: Objective measures were completed at Evaluation unless otherwise noted.  DIAGNOSTIC FINDINGS: Tricompartmental osteoarthritis noted in most previous imaging per imaging report  PATIENT SURVEYS:  FOTO 55  COGNITION: Overall cognitive status: Within functional limits for tasks assessed     SENSATION: WFL  EDEMA:  Clear edema noted on right lower extremity on medial side of knee.  Patient does have bilateral edema noted which may be due to lymphedema as noted in history    POSTURE:  Flexed posture and gait with no suitable flexion in the hip  PALPATION: TTP along hamstring, patellar and quad tendon and all area surrounding kneecap   LOWER EXTREMITY ROM:  Active ROM Right eval Left eval  Hip flexion    Hip extension    Hip abduction    Hip adduction    Hip internal rotation    Hip external rotation    Knee flexion 97 105  Knee extension 0 0  Ankle dorsiflexion    Ankle plantarflexion    Ankle inversion    Ankle eversion     (Blank rows = not tested)  LOWER EXTREMITY MMT:  MMT Right eval Left eval  Hip flexion 4 4  Hip extension    Hip abduction    Hip adduction    Hip internal rotation    Hip  external rotation    Knee flexion 3+* 4  Knee extension 3+* 4  Ankle dorsiflexion 5 5  Ankle plantarflexion 5 5  Ankle inversion    Ankle eversion     (Blank rows = not tested)    FUNCTIONAL TESTS:  5 times sit to stand: 18.1 sec  GAIT: Distance walked: 84M Assistive device utilized: Environmental consultant - 4 wheeled Level of assistance: Modified independence Comments: flexed posture (hip flexion) throughout with heavy lean on rollator    TODAY'S TREATMENT DATE: 08/05/23  TherEx: Seated  - Long arc quads with 2#aw 2x10 each LE - Hamstring curls RTB 2x10 each LE - Seated marches with aw 2x10 each LE - Lateral hurdle step overs 2x10 each LE  - Seated hamstring stretch 30s each LE, pt reports it "feels good"   Standing  - Sit to stand 2x5, without UE, "popping" of right knee when transferring stand>sit, knee buckling throughout   - Hamstring curls 2#aw 1x10 each LE    NMR:   -Foot taps 2x20 alternating each LE, loses balance towards the right in the beginning but as repetition increases balance improves -Foot taps 1x10 each LE, deferred further repetitions due to pt reports of a burning sensation, after rest break sensation goes away         PATIENT EDUCATION:  Education details: POC Person educated: Patient Education method: Explanation Education comprehension: verbalized understanding   HOME EXERCISE PROGRAM: Access Code: [email or web address] URL: [email or web address] Date: 07/13/2023 Prepared by: Precious Bard  Exercises - Leg Extension  - 1 x daily - 7 x weekly - 2 sets - 10 reps - 5 hold - Seated Hamstring Stretch  - 1 x daily - 7 x weekly - 2 sets - 2 reps - 30 hold - Seated Ankle Alphabet  - 1 x daily - 7 x weekly - 2 sets - 10 reps - 5 hold    CLINICAL IMPRESSION: Patient presents to therapy highly motivated and eager to make improvements. In today's session, patient reports high levels of pain that decreased with some gentle stretching. When performing sit  to stands, she experiences knee buckling demonstrating a need to improve lower extremity strength. She presents with decreased balance evidenced by difficulty of maintaining balance throughout step ups.  Pt noted she felt swelling in the R LE around the knee,  however upon inspection, pt did not have any pitting edema at this time. She will benefit from a progression of exercises for her home exercise plan pending her pain is under better control next visit. Pt will benefit from skilled PT intervention in order to improve LE strength, improve B knee mobility, improve her balance, decrease her falling frequency and to increase her overall mobility.    OBJECTIVE IMPAIRMENTS: Abnormal gait, decreased activity tolerance, decreased balance, decreased endurance, decreased mobility, difficulty walking, decreased ROM, decreased strength, and pain.   ACTIVITY LIMITATIONS: lifting, bending, standing, squatting, stairs, and transfers  PARTICIPATION LIMITATIONS: meal prep, cleaning, laundry, driving, shopping, community activity, and yard work  PERSONAL FACTORS: 3+ comorbidities: Obesity,B knee arthritis, HTN,   are also affecting patient's functional outcome.   REHAB POTENTIAL: Fair due to arthrotic changes in the knees.   CLINICAL DECISION MAKING: Stable/uncomplicated  EVALUATION COMPLEXITY: Low   GOALS: Goals reviewed with patient? Yes  SHORT TERM GOALS: Target date: 08/07/2023      Patient will be independent in home exercise program to improve strength/mobility for better functional independence with ADLs. Baseline: No HEP currently  Goal status: INITIAL   LONG TERM GOALS: Target date: 10/02/2023   1.  Patient (> 12 years old) will complete five times sit to stand test in < 15 seconds indicating an increased LE strength and improved balance. Baseline: 18.1 Goal status: INITIAL  2.  Patient will increase FOTO score to equal to or greater than  60   to demonstrate statistically significant  improvement in mobility and quality of life.  Baseline: 55 Goal status: INITIAL   3.  Patient will increase Berg Balance score by > 6 points to demonstrate decreased fall risk during functional activities. Baseline: BERG 35/56 Goal status: INITIAL  4.   Patient will increase 10 meter walk test to >1.1m/s as to improve gait speed for better community ambulation and to reduce fall risk. Baseline: Test at visit 2 with multiple assistive devices including rollator and her he cane type cane 10/7: with rollator 15.81 seconds and with SPC 14.46 seconds Goal status: INITIAL  5.  Patient will improve right knee range of motion to 105 degrees or greater in order to allow for improved range of motion for functional activities including getting in and out of the car.  Baseline: 95 deg Goal status: INITIAL     PLAN:  PT FREQUENCY: 2x/week  PT DURATION: 12 weeks  PLANNED INTERVENTIONS: Therapeutic exercises, Therapeutic activity, Neuromuscular re-education, Balance training, Gait training, Patient/Family education, Self Care, Joint mobilization, Joint manipulation, Stair training, and Manual therapy  PLAN FOR NEXT SESSION: Update HEP, lower extremity strengthening (quads) , balance activities    Randon Goldsmith, Student-PT   This entire session was performed under direct supervision and direction of a licensed therapist. I have personally read, edited and approve of the note as written.    Nolon Bussing, PT, DPT Physical Therapist - [facility]  08/05/23, 3:55 PM

## 2023-08-05 ENCOUNTER — Ambulatory Visit: Payer: 59

## 2023-08-05 DIAGNOSIS — R262 Difficulty in walking, not elsewhere classified: Secondary | ICD-10-CM

## 2023-08-05 DIAGNOSIS — G8929 Other chronic pain: Secondary | ICD-10-CM

## 2023-08-05 DIAGNOSIS — M6281 Muscle weakness (generalized): Secondary | ICD-10-CM

## 2023-08-05 DIAGNOSIS — M25562 Pain in left knee: Secondary | ICD-10-CM | POA: Diagnosis not present

## 2023-08-05 DIAGNOSIS — R269 Unspecified abnormalities of gait and mobility: Secondary | ICD-10-CM

## 2023-08-07 ENCOUNTER — Telehealth: Payer: Self-pay

## 2023-08-07 ENCOUNTER — Encounter: Payer: Self-pay | Admitting: Plastic Surgery

## 2023-08-07 ENCOUNTER — Ambulatory Visit (INDEPENDENT_AMBULATORY_CARE_PROVIDER_SITE_OTHER): Payer: 59 | Admitting: Plastic Surgery

## 2023-08-07 DIAGNOSIS — M793 Panniculitis, unspecified: Secondary | ICD-10-CM

## 2023-08-07 NOTE — Telephone Encounter (Signed)
Called patient and was not able to leave her a voicemail since it stated that it's full. I will send her a message through MyChart letting her know that we do not have anything available to see her soon but that she could reach out to see her PCP or go to the emergency room. I will schedule her an appointment to see Dr. Tobi Bastos in December.

## 2023-08-07 NOTE — Telephone Encounter (Signed)
Patient called yesterday at 4:45pm and message was put in save message and never documented in chart. Patient states on voicemail she is having burning sensation in throat and dysphagia. She would like a call back from Dr. Tobi Bastos medical assistant

## 2023-08-07 NOTE — Progress Notes (Signed)
   Subjective:    Patient ID: Heather Sweeney, female    DOB: September 22, 1966, 57 y.o.   MRN: [id number]   The patient is a 57 year old female here for evaluation of her abdomen.  She is 5 feet 3 inches tall and has been able to decrease her weight from her last visit.  She had unfortunately increased her weight from after her panniculectomy and so she is going back in the right direction.  She does want to lose some more weight which is good to be beneficial.  She says at least 20 pounds.  If she does the surgery for abdominoplasty prior to the weight loss and then loses the weight she will not have as good of her results.  No hernias palpated at this time.     Review of Systems  Constitutional:  Positive for activity change. Negative for appetite change.  HENT: Negative.    Eyes: Negative.   Respiratory: Negative.    Cardiovascular: Negative.   Gastrointestinal: Negative.   Endocrine: Negative.   Genitourinary: Negative.   Musculoskeletal: Negative.        Objective:   Physical Exam Vitals reviewed.  Constitutional:      Appearance: Normal appearance.  HENT:     Head: Atraumatic.  Cardiovascular:     Rate and Rhythm: Normal rate.     Pulses: Normal pulses.  Pulmonary:     Effort: Pulmonary effort is normal.  Skin:    General: Skin is warm.     Capillary Refill: Capillary refill takes less than 2 seconds.  Neurological:     Mental Status: She is alert and oriented to person, place, and time.  Psychiatric:        Mood and Affect: Mood normal.        Behavior: Behavior normal.        Thought Content: Thought content normal.        Judgment: Judgment normal.          Assessment & Plan:     ICD-10-CM   1. Panniculitis  M79.3       Pictures were obtained of the patient and placed in the chart with the patient's or guardian's permission.  I will send the patient a quote. In the meantime she is going to continue to try and lose the weight and we will talk again in 6  months. The patient is interested in further reduction of her abdominal area.  I think that this is possible but not likely covered by insurance.

## 2023-08-11 ENCOUNTER — Ambulatory Visit: Payer: 59 | Attending: Physical Medicine and Rehabilitation | Admitting: Physical Therapy

## 2023-08-11 ENCOUNTER — Telehealth: Payer: Self-pay | Admitting: Physical Therapy

## 2023-08-11 NOTE — Telephone Encounter (Signed)
Attempted to reach pt via telephone on provided number, to notify patient of missed PT treatment. No Answer. Voicemail box full and unable to leave message.    Grier Rocher PT, DPT  Physical Therapist - [place]  [facility]  8:37 AM 08/11/23

## 2023-08-12 ENCOUNTER — Ambulatory Visit: Payer: 59 | Admitting: Physical Therapy

## 2023-08-13 ENCOUNTER — Ambulatory Visit: Payer: 59 | Admitting: Physical Therapy

## 2023-08-14 ENCOUNTER — Ambulatory Visit: Payer: 59 | Admitting: Physical Therapy

## 2023-08-18 ENCOUNTER — Telehealth: Payer: Self-pay | Admitting: Physical Therapy

## 2023-08-18 ENCOUNTER — Ambulatory Visit: Payer: 59 | Admitting: Physical Therapy

## 2023-08-18 DIAGNOSIS — R197 Diarrhea, unspecified: Secondary | ICD-10-CM | POA: Insufficient documentation

## 2023-08-18 NOTE — Telephone Encounter (Signed)
Pt missed scheduled PT treatment. Spoke with pt via telephone. Reports that she was seen by Orthopedic MD at [facility] last week. Pt is scheduled to proceed with knee replacement in near future. Follow-up scheduled for end of November with MD.  Will d/c from PT as pt prepares for TKA. PT Encouraged to continue to perform HEP as pt prepares for surgery to improve long term outcomes.   Grier Rocher PT, DPT  Physical Therapist - [facility]  10:21 AM 08/18/23

## 2023-08-20 ENCOUNTER — Ambulatory Visit: Payer: 59 | Admitting: Physical Therapy

## 2023-08-26 ENCOUNTER — Ambulatory Visit (INDEPENDENT_AMBULATORY_CARE_PROVIDER_SITE_OTHER): Payer: 59 | Admitting: Nurse Practitioner

## 2023-08-27 ENCOUNTER — Ambulatory Visit: Payer: 59 | Admitting: Physical Therapy

## 2023-08-31 ENCOUNTER — Ambulatory Visit: Payer: 59 | Admitting: Physical Therapy

## 2023-09-01 ENCOUNTER — Encounter: Payer: 59 | Admitting: Family Medicine

## 2023-09-02 ENCOUNTER — Telehealth (INDEPENDENT_AMBULATORY_CARE_PROVIDER_SITE_OTHER): Payer: 59 | Admitting: Family

## 2023-09-02 VITALS — Ht 63.0 in | Wt 330.0 lb

## 2023-09-02 DIAGNOSIS — M170 Bilateral primary osteoarthritis of knee: Secondary | ICD-10-CM

## 2023-09-02 NOTE — Progress Notes (Signed)
Virtual Visit via Video Note  I connected with Heather Sweeney on 09/02/23 at 11:30 AM EST by a video enabled telemedicine application and verified that I am speaking with the correct person using two identifiers. Location patient: home Location provider: work  Persons participating in the virtual visit: patient, provider  I discussed the limitations of evaluation and management by telemedicine and the availability of in person appointments. The patient expressed understanding and agreed to proceed.  HPI: She would like to follow-up today and return to our clinic  No new concerns.  Continues to have chronic right knee pain and swelling, which is unchanged.  Orthopedic has recommended knee replacement.  She is not sure if she wants to proceed with knee replacement.   she is currently doing PT.   She is following with Dr Sherrin Daisy as PCP.   Following with [facility], last seen 08/14/23   Crt 1.06  MRI right 07/2023 large radial tear meniscus, severe osteoarthritis, large joint effusion  ROS: See pertinent positives and negatives per HPI.  EXAM:  VITALS per patient if applicable: Ht 5\' 3"  (1.6 m)   Wt (!) 330 lb (149.7 kg) Comment: per pt  BMI 58.46 kg/m  BP Readings from Last 3 Encounters:  07/02/23 112/75  02/10/23 (!) 150/81  12/12/22 (!) 145/77   Wt Readings from Last 3 Encounters:  09/02/23 (!) 330 lb (149.7 kg)  07/02/23 248 lb (112.5 kg)  02/10/23 251 lb 3.2 oz (113.9 kg)    GENERAL: alert, oriented, appears well and in no acute distress  HEENT: atraumatic, conjunttiva clear, no obvious abnormalities on inspection of external nose and ears  NECK: normal movements of the head and neck  LUNGS: on inspection no signs of respiratory distress, breathing rate appears normal, no obvious gross SOB, gasping or wheezing  CV: no obvious cyanosis  MS: moves all visible extremities without noticeable abnormality  PSYCH/NEURO: pleasant and cooperative, no obvious  depression or anxiety, speech and thought processing grossly intact  ASSESSMENT AND PLAN: Arthritis of both knees Assessment & Plan: Chronic, stable.  Advised patient that she would need in person follow-up with [place] for routine labs and blood pressure.  Advised to continue to follow-up with orthopedic.  She will continue to consider right knee replacement.       -we discussed possible serious and likely etiologies, options for evaluation and workup, limitations of telemedicine visit vs in person visit, treatment, treatment risks and precautions. Pt prefers to treat via telemedicine empirically rather then risking or undertaking an in person visit at this moment.    I discussed the assessment and treatment plan with the patient. The patient was provided an opportunity to ask questions and all were answered. The patient agreed with the plan and demonstrated an understanding of the instructions.   The patient was advised to call back or seek an in-person evaluation if the symptoms worsen or if the condition fails to improve as anticipated.  Advised if desired AVS can be mailed or viewed via MyChart if Mychart user.   Rennie Plowman, FNP

## 2023-09-02 NOTE — Assessment & Plan Note (Signed)
Chronic, stable.  Advised patient that she would need in person follow-up with [place] for routine labs and blood pressure.  Advised to continue to follow-up with orthopedic.  She will continue to consider right knee replacement.

## 2023-09-02 NOTE — Patient Instructions (Signed)
Nice to see you today.  Please call the office when you can to schedule an in person visit with [place] to formally transfer care back to our office update labs and blood pressure.  Please continue to closely follow with orthopedics as discussed

## 2023-09-07 ENCOUNTER — Ambulatory Visit: Payer: 59 | Admitting: Physical Therapy

## 2023-09-09 ENCOUNTER — Encounter: Payer: 59 | Admitting: Physical Therapy

## 2023-09-09 ENCOUNTER — Ambulatory Visit: Payer: 59 | Admitting: Family Medicine

## 2023-09-14 ENCOUNTER — Encounter: Payer: 59 | Admitting: Physical Therapy

## 2023-09-16 ENCOUNTER — Telehealth: Payer: 59 | Admitting: Family

## 2023-09-16 ENCOUNTER — Other Ambulatory Visit: Payer: Self-pay | Admitting: Family

## 2023-09-17 ENCOUNTER — Ambulatory Visit: Payer: 59 | Admitting: Family

## 2023-09-17 ENCOUNTER — Encounter: Payer: 59 | Admitting: Physical Therapy

## 2023-09-21 ENCOUNTER — Ambulatory Visit (INDEPENDENT_AMBULATORY_CARE_PROVIDER_SITE_OTHER): Payer: 59 | Admitting: Gastroenterology

## 2023-09-21 VITALS — BP 136/82 | HR 94 | Temp 97.9°F | Wt 235.0 lb

## 2023-09-21 DIAGNOSIS — R1310 Dysphagia, unspecified: Secondary | ICD-10-CM

## 2023-09-21 DIAGNOSIS — R12 Heartburn: Secondary | ICD-10-CM

## 2023-09-21 DIAGNOSIS — R1319 Other dysphagia: Secondary | ICD-10-CM

## 2023-09-21 IMAGING — MG MM DIGITAL SCREENING BILAT W/ TOMO AND CAD
8 series · 8 of 24 positions shown · non-contrast
Comparison: Previous exam(s).

CLINICAL DATA: Screening.

EXAM:
DIGITAL SCREENING BILATERAL MAMMOGRAM WITH TOMOSYNTHESIS AND CAD
TECHNIQUE: Bilateral screening digital craniocaudal and mediolateral oblique
mammograms were obtained. Bilateral screening digital breast
tomosynthesis was performed. The images were evaluated with
computer-aided detection.

[R CC synth-2D]
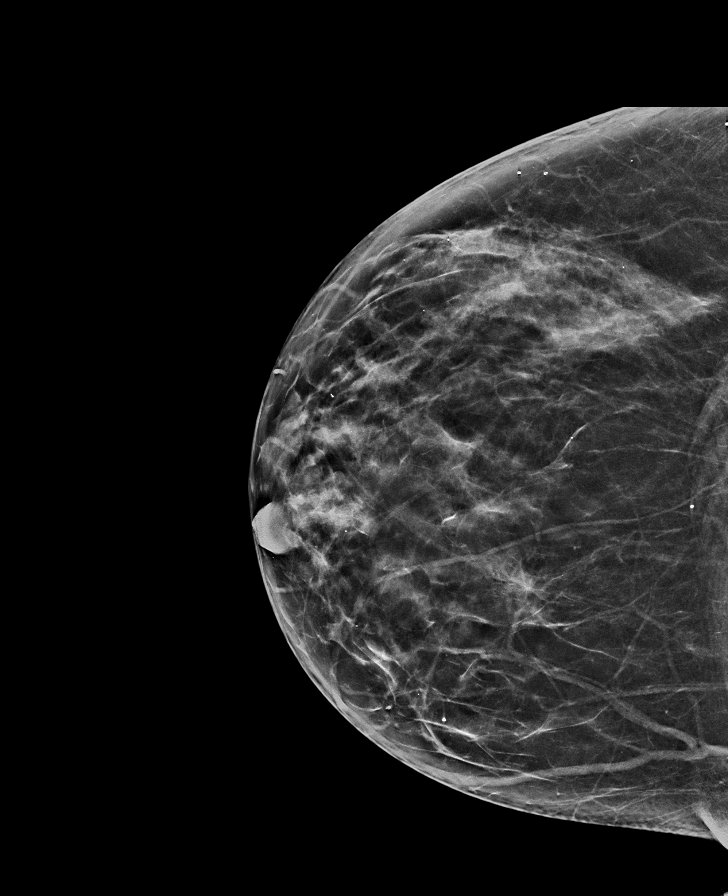

[L MLO synth-2D]
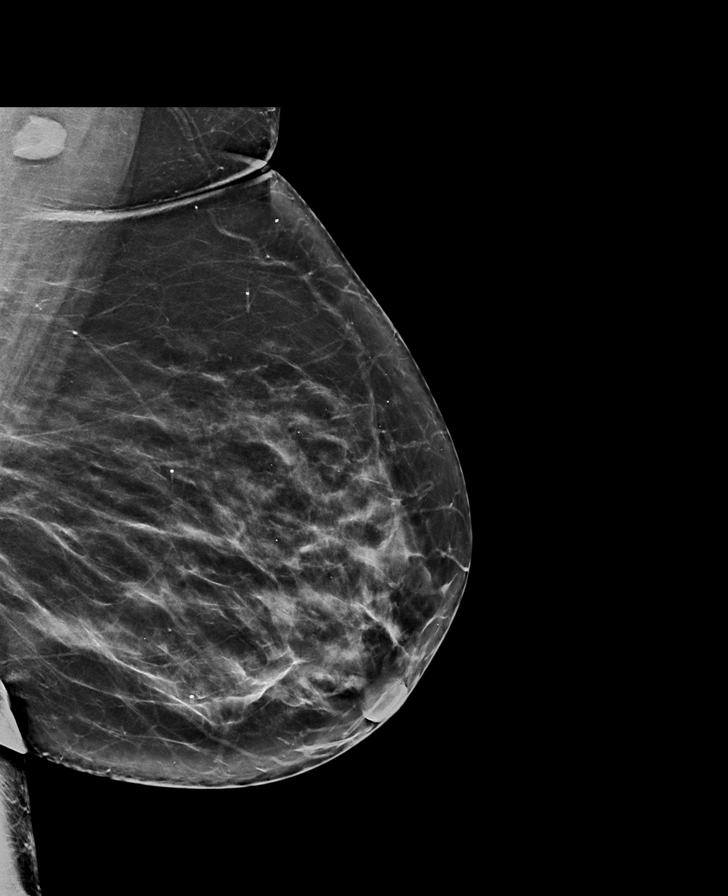

[L CC synth-2D]
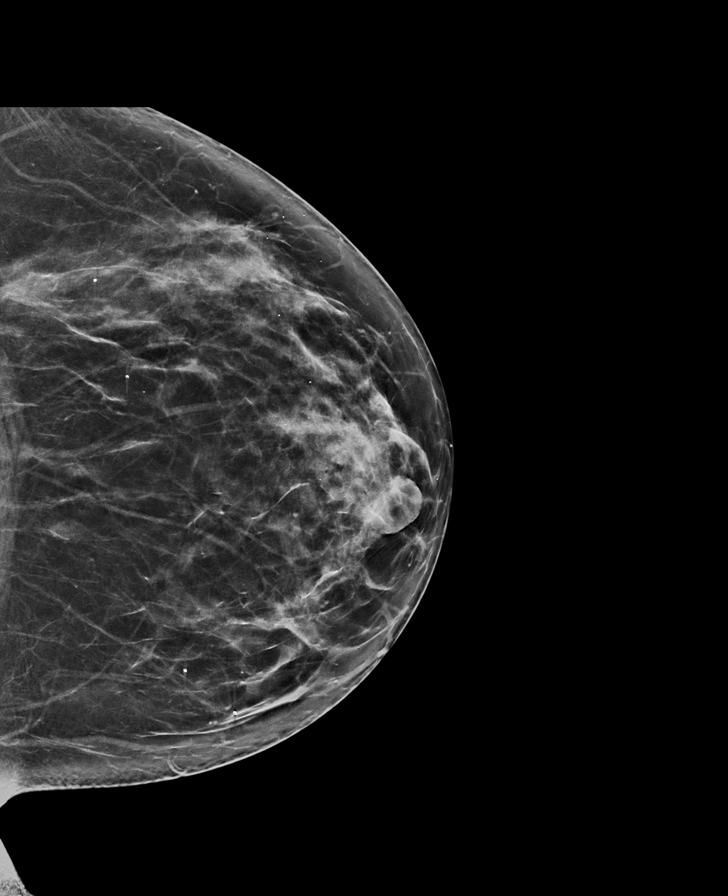

[R MLO synth-2D]
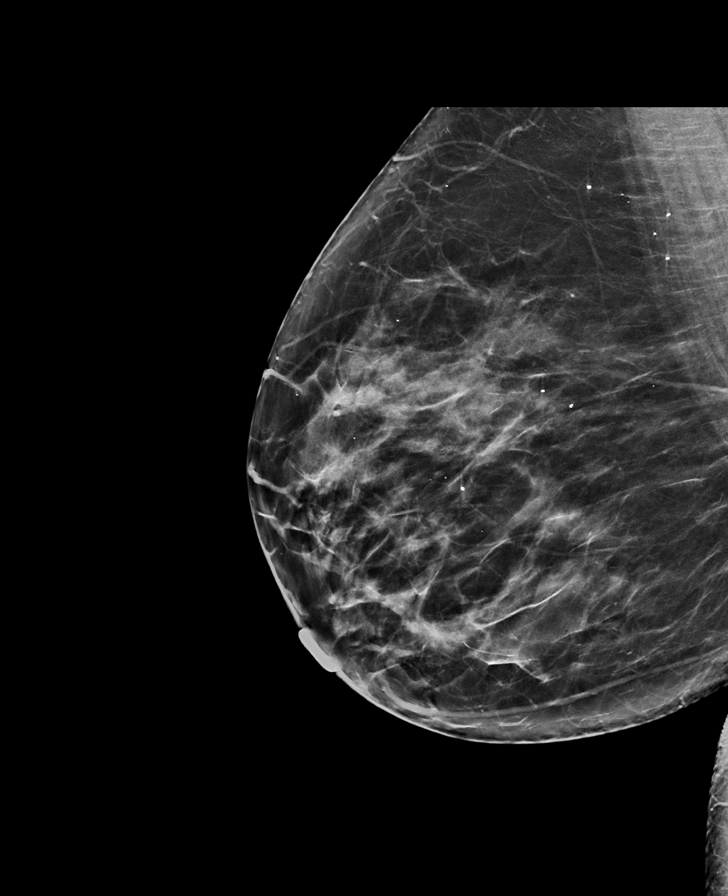

[L CC tomo · tomo slice 38/75.0]
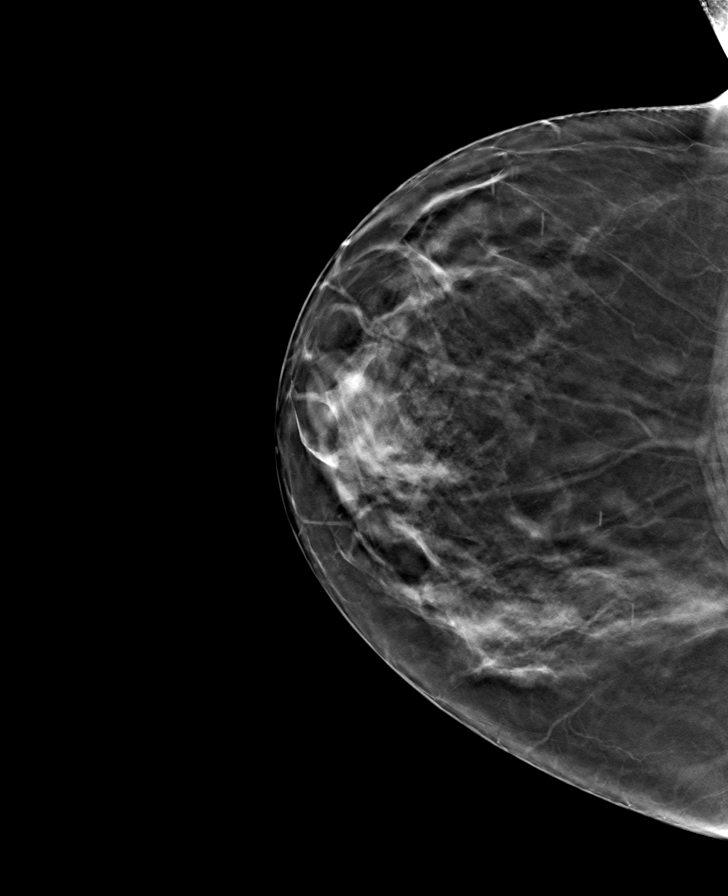

[L MLO tomo · tomo slice 41/82.0]
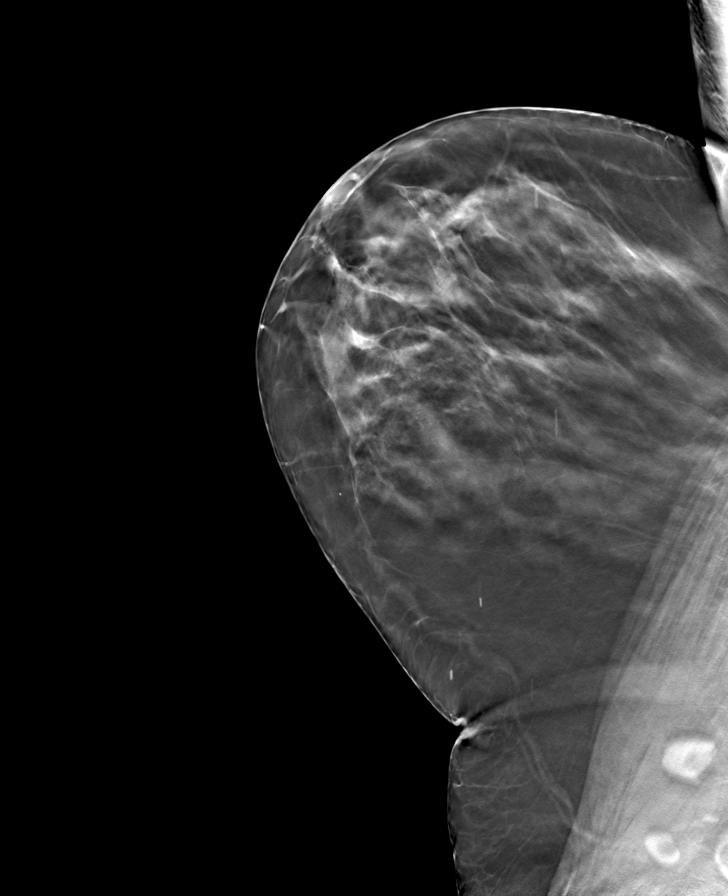

[R CC tomo · tomo slice 35/70.0]
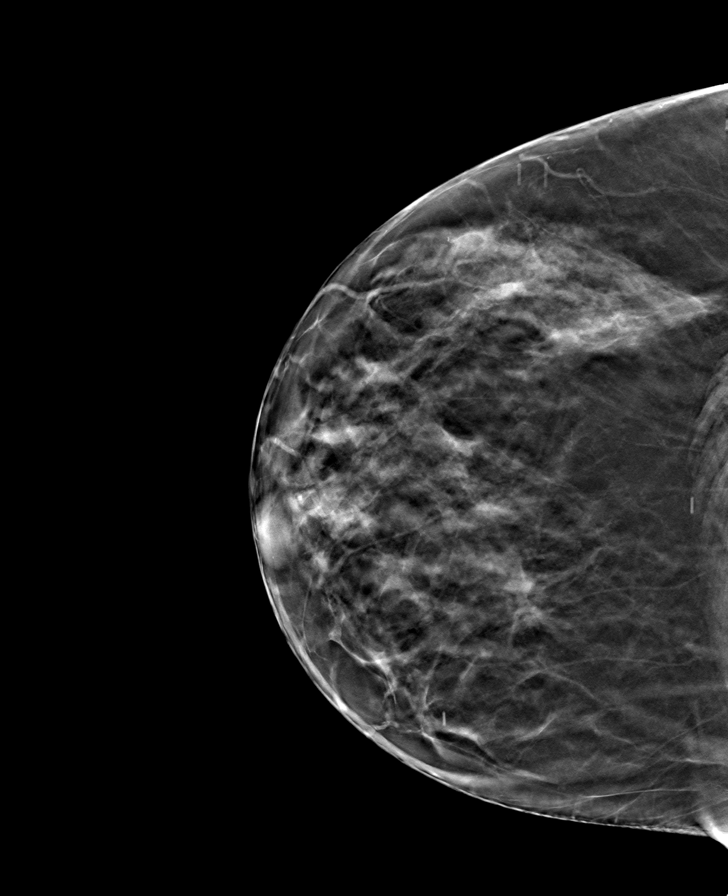

[R MLO tomo · tomo slice 39/77.0]
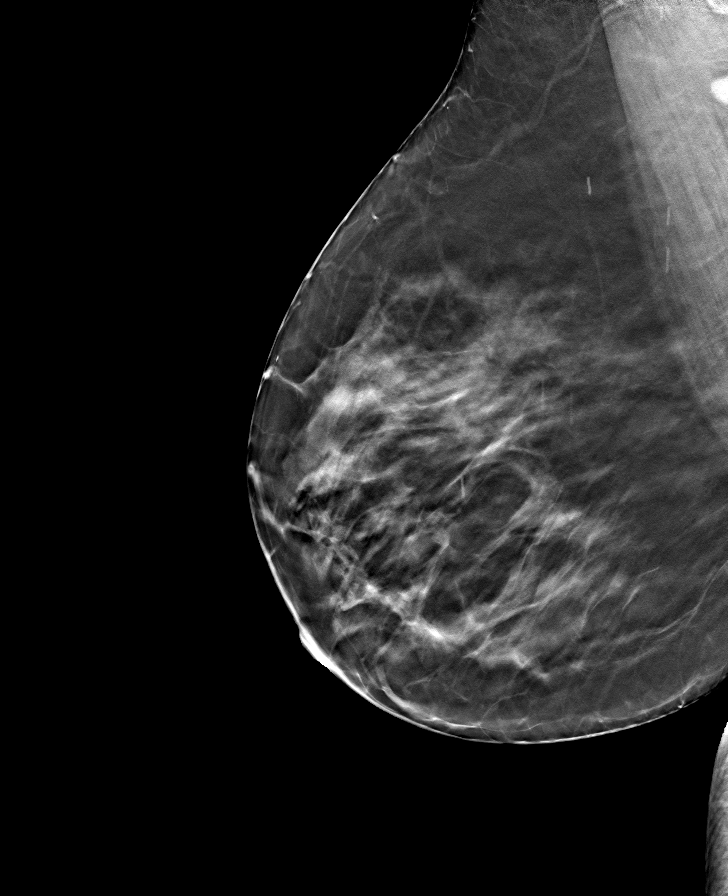

[8 of 24 positions shown; findings below may reference images not displayed]

ACR Breast Density Category c: The breast tissue is heterogeneously
dense, which may obscure small masses.
FINDINGS: There are no findings suspicious for malignancy.
IMPRESSION: No mammographic evidence of malignancy. A result letter of this
screening mammogram will be mailed directly to the patient.

RECOMMENDATION:
Screening mammogram in one year. (Code:[id number])

BI-RADS CATEGORY  1: Negative.

## 2023-09-21 MED ORDER — OMEPRAZOLE 40 MG PO CPDR: 40.0000 mg | DELAYED_RELEASE_CAPSULE | Freq: Two times a day (BID) | ORAL | 0 refills | Status: DC

## 2023-09-21 NOTE — Progress Notes (Signed)
[name] MD, MRCP(U.K) [address]  [address]  [place], [place] [address]  Main: [phone]  Fax: [phone]   Primary Care Physician: Louis Matte, MD  Primary Gastroenterologist:  Dr. [name]   Chief Complaint  Patient presents with   Dysphagia    HPI: Heather Sweeney is a 57 y.o. female   Summary of history :  She was last seen back in May 2023, for anemia was normocytic.  History of dysphagia back in 2022 that had improved after stopping Victoza felt likely due to reflux gastroparesis and usage of oxycodone.  History of use of snuff and her father had esophageal cancer EGD in 2022 showed impaired motility of the esophagus biopsies showed no evidence of eosinophilic esophagitis  Interval history   Here today to see me for dysphagia going on for 2-1/2 months solids and liquids both go the wrong way causing her to cough.  She has been commenced on Ozempic takes Vicodin also has heartburn was given some medications over-the-counter but she does not think it has helped cannot recollect the name of her medication denies any weight loss.    Current Outpatient Medications  Medication Sig Dispense Refill   ACCU-CHEK GUIDE test strip Use as instructed 100 strip 3   albuterol (VENTOLIN HFA) 108 (90 Base) MCG/ACT inhaler Inhale 1-2 puffs into the lungs every 6 (six) hours as needed for wheezing or shortness of breath. 18 g 11   amLODipine (NORVASC) 5 MG tablet Take 1 tablet (5 mg total) by mouth daily. 90 tablet 3   benztropine (COGENTIN) 1 MG tablet Take 1 mg by mouth 2 (two) times daily.     Blood Glucose Monitoring Suppl (ACCU-CHEK GUIDE ME) w/Device KIT Use as directed,once daily to test blood sugar 1 kit 0   buPROPion (WELLBUTRIN XL) 150 MG 24 hr tablet Take 150 mg by mouth every morning.     busPIRone (BUSPAR) 15 MG tablet Take 15 mg by mouth 2 (two) times daily.     cetirizine (ZYRTEC ALLERGY) 10 MG tablet Take 2 tablets (20 mg total) by mouth  daily. 180 tablet 3   EPINEPHrine 0.3 mg/0.3 mL IJ SOAJ injection Inject 0.3 mg into the muscle as needed for anaphylaxis. 2 each 3   famotidine (PEPCID) 40 MG tablet Take 1 tablet (40 mg total) by mouth daily. 90 tablet 3   fexofenadine (ALLEGRA) 180 MG tablet Take 2 tablets (360 mg total) by mouth every morning. Prn only if needed either take zyrtec or allegra explain to patient 180 tablet 3   FLUoxetine (PROZAC) 40 MG capsule Take 40 mg by mouth daily.     fluPHENAZine (PROLIXIN) 2.5 MG tablet Take 2.5 mg by mouth 2 (two) times daily.     fluPHENAZine (PROLIXIN) 5 MG tablet Take 5 mg by mouth 2 (two) times daily.     fluticasone (FLONASE) 50 MCG/ACT nasal spray Place 2 sprays into both nostrils daily. 48 g 3   hydrOXYzine (ATARAX) 50 MG tablet Take 50 mg by mouth 4 (four) times daily as needed for anxiety.     Insulin Pen Needle (NOVOFINE) 30G X 8 MM MISC Use with victoza daily 1.8 mg 90 each 3   liraglutide (VICTOZA) 18 MG/3ML SOPN Inject 1.8 mg into the skin daily. 9 mL 11   metoprolol succinate (TOPROL-XL) 25 MG 24 hr tablet Take 25 mg by mouth daily.     metroNIDAZOLE (FLAGYL) 500 MG tablet Take 1 tab BID for 7  days; NO alcohol use for 10 days after prescription start 14 tablet 0   montelukast (SINGULAIR) 10 MG tablet Take 1 tablet (10 mg total) by mouth daily. 90 tablet 3   Multiple Vitamins-Minerals (ALIVE MULTI-VITAMIN PO) Take by mouth daily in the afternoon.     nicotine (NICODERM CQ - DOSED IN MG/24 HOURS) 14 mg/24hr patch Place 1 patch (14 mg total) onto the skin daily. 28 patch 0   omalizumab (XOLAIR) 150 MG/ML prefilled syringe      oxyCODONE-acetaminophen (PERCOCET) 10-325 MG tablet Take 1 tablet by mouth 3 (three) times daily as needed for pain.     scopolamine (TRANSDERM-SCOP) 1 MG/3DAYS Place 1 patch (1.5 mg total) onto the skin every 3 (three) days. 4 hours before you get on boat 4 patch 0   topiramate (TOPAMAX) 25 MG tablet Take 1 tablet (25 mg total) by mouth 2 (two) times  daily. 90 tablet 3   vitamin B-12 (CYANOCOBALAMIN) 1000 MCG tablet Take 1,000 mcg by mouth daily.     No current facility-administered medications for this visit.    Allergies as of 09/21/2023 - Review Complete 09/21/2023  Allergen Reaction Noted   Cozaar [losartan] Hives and Swelling 07/08/2017   Keflex [cephalexin] Swelling and Rash 11/01/2015   Penicillins Rash 11/01/2015   Zestril [lisinopril] Hives and Other (See Comments) 11/01/2015   Hctz [hydrochlorothiazide] Swelling and Rash 11/01/2015   Bee venom Swelling 12/27/2018   Metformin and related Rash      ROS:  General: Negative for anorexia, weight loss, fever, chills, fatigue, weakness. ENT: Negative for hoarseness, difficulty swallowing , nasal congestion. CV: Negative for chest pain, angina, palpitations, dyspnea on exertion, peripheral edema.  Respiratory: Negative for dyspnea at rest, dyspnea on exertion, cough, sputum, wheezing.  GI: See history of present illness. GU:  Negative for dysuria, hematuria, urinary incontinence, urinary frequency, nocturnal urination.  Endo: Negative for unusual weight change.    Physical Examination:   BP 136/82   Pulse 94   Temp 97.9 F (36.6 C) (Oral)   Wt 235 lb (106.6 kg)   BMI 41.63 kg/m   General: Well-nourished, well-developed in no acute distress.  Eyes: No icterus. Conjunctivae pink. Mouth: Oropharyngeal mucosa moist and pink , no lesions erythema or exudate. Neuro: Alert and oriented x 3.  Grossly intact. Skin: Warm and dry, no jaundice.   Psych: Alert and cooperative, normal mood and affect.   Imaging Studies: No results found.  Assessment and Plan:   Heather Sweeney is a 57 y.o. y/o female here to see me for dysphagia.  History of dysphagia in the past EGD in 2020 showed severe dysmotility of the esophagus comes in with symptoms suggestive of transfer dysphagia with solids and liquids both going the wrong way and causing her to cough in addition she also has  heartburn suggestive of acid reflux possibly dissolve may be related to the use of oxycodone and Ozempic which she states she is on which may be causing a degree of gastroparesis and reflux.  Plan 1.  Trial of Prilosec 40 mg twice daily 2.  Barium swallow with tablet and modified barium swallow 3.  Follow-up in 6 to 8 weeks if no better we will proceed with upper endoscopy. 4.  I have advised her to stop using oxycodone if possible.    Dr [name]  MD,MRCP [facility]) Follow up in 6 to 8 weeks

## 2023-09-21 NOTE — Patient Instructions (Addendum)
[facility] at 9:45 AM on 09/25/2023 for your barium swallow test. Please do not eat or drink 3 hours prior to your exam. If you need to reschedule, please call [phone].  You will also receive a call from [facility] to schedule you your Modified Barium test. So please be aware.

## 2023-09-22 ENCOUNTER — Ambulatory Visit (INDEPENDENT_AMBULATORY_CARE_PROVIDER_SITE_OTHER): Payer: 59 | Admitting: *Deleted

## 2023-09-22 ENCOUNTER — Encounter: Payer: 59 | Admitting: Physical Therapy

## 2023-09-22 ENCOUNTER — Encounter: Payer: Self-pay | Admitting: Family

## 2023-09-22 ENCOUNTER — Ambulatory Visit: Payer: 59 | Admitting: Family

## 2023-09-22 VITALS — BP 126/80 | HR 80 | Temp 98.8°F | Ht 63.0 in | Wt 232.6 lb

## 2023-09-22 VITALS — Ht 63.0 in | Wt 232.0 lb

## 2023-09-22 DIAGNOSIS — Z0000 Encounter for general adult medical examination without abnormal findings: Secondary | ICD-10-CM

## 2023-09-22 DIAGNOSIS — E785 Hyperlipidemia, unspecified: Secondary | ICD-10-CM

## 2023-09-22 DIAGNOSIS — R7309 Other abnormal glucose: Secondary | ICD-10-CM | POA: Diagnosis not present

## 2023-09-22 DIAGNOSIS — Z1231 Encounter for screening mammogram for malignant neoplasm of breast: Secondary | ICD-10-CM

## 2023-09-22 DIAGNOSIS — Z87898 Personal history of other specified conditions: Secondary | ICD-10-CM

## 2023-09-22 DIAGNOSIS — I10 Essential (primary) hypertension: Secondary | ICD-10-CM | POA: Diagnosis not present

## 2023-09-22 DIAGNOSIS — M89319 Hypertrophy of bone, unspecified shoulder: Secondary | ICD-10-CM

## 2023-09-22 DIAGNOSIS — Z6841 Body Mass Index (BMI) 40.0 and over, adult: Secondary | ICD-10-CM | POA: Diagnosis not present

## 2023-09-22 DIAGNOSIS — F32A Depression, unspecified: Secondary | ICD-10-CM

## 2023-09-22 DIAGNOSIS — F419 Anxiety disorder, unspecified: Secondary | ICD-10-CM

## 2023-09-22 LAB — CBC WITH DIFFERENTIAL/PLATELET
Basophils Absolute: 0 10*3/uL (ref 0.0–0.1)
Basophils Relative: 0.2 % (ref 0.0–3.0)
Eosinophils Absolute: 0.2 10*3/uL (ref 0.0–0.7)
Eosinophils Relative: 2.5 % (ref 0.0–5.0)
HCT: 35.6 % — ABNORMAL LOW (ref 36.0–46.0)
Hemoglobin: 11.7 g/dL — ABNORMAL LOW (ref 12.0–15.0)
Lymphocytes Relative: 42.8 % (ref 12.0–46.0)
Lymphs Abs: 2.9 10*3/uL (ref 0.7–4.0)
MCHC: 33 g/dL (ref 30.0–36.0)
MCV: 80.8 fL (ref 78.0–100.0)
Monocytes Absolute: 0.5 10*3/uL (ref 0.1–1.0)
Monocytes Relative: 7.5 % (ref 3.0–12.0)
Neutro Abs: 3.2 10*3/uL (ref 1.4–7.7)
Neutrophils Relative %: 47 % (ref 43.0–77.0)
Platelets: 225 10*3/uL (ref 150.0–400.0)
RBC: 4.4 Mil/uL (ref 3.87–5.11)
RDW: 15.4 % (ref 11.5–15.5)
WBC: 6.7 10*3/uL (ref 4.0–10.5)

## 2023-09-22 LAB — COMPREHENSIVE METABOLIC PANEL
ALT: 14 U/L (ref 0–35)
AST: 11 U/L (ref 0–37)
Albumin: 4.1 g/dL (ref 3.5–5.2)
Alkaline Phosphatase: 90 U/L (ref 39–117)
BUN: 9 mg/dL (ref 6–23)
CO2: 26 meq/L (ref 19–32)
Calcium: 9 mg/dL (ref 8.4–10.5)
Chloride: 107 meq/L (ref 96–112)
Creatinine, Ser: 1.21 mg/dL — ABNORMAL HIGH (ref 0.40–1.20)
GFR: 50.53 mL/min — ABNORMAL LOW (ref >60.00)
Glucose, Bld: 90 mg/dL (ref 70–99)
Potassium: 3.4 meq/L — ABNORMAL LOW (ref 3.5–5.1)
Sodium: 139 meq/L (ref 135–145)
Total Bilirubin: 0.4 mg/dL (ref 0.2–1.2)
Total Protein: 6.7 g/dL (ref 6.0–8.3)

## 2023-09-22 LAB — LIPID PANEL
Cholesterol: 147 mg/dL (ref 0–200)
HDL: 57.9 mg/dL (ref >39.00)
LDL Cholesterol: 74 mg/dL (ref 0–99)
NonHDL: 89.44
Total CHOL/HDL Ratio: 3
Triglycerides: 76 mg/dL (ref 0.0–149.0)
VLDL: 15.2 mg/dL (ref 0.0–40.0)

## 2023-09-22 LAB — VITAMIN D 25 HYDROXY (VIT D DEFICIENCY, FRACTURES): VITD: 29.74 ng/mL — ABNORMAL LOW (ref 30.00–100.00)

## 2023-09-22 LAB — TSH: TSH: 1.21 u[IU]/mL (ref 0.35–5.50)

## 2023-09-22 LAB — MICROALBUMIN / CREATININE URINE RATIO
Creatinine,U: 174.5 mg/dL
Microalb Creat Ratio: 0.4 mg/g (ref 0.0–30.0)
Microalb, Ur: <0.7 mg/dL (ref 0.0–1.9)

## 2023-09-22 LAB — POCT GLYCOSYLATED HEMOGLOBIN (HGB A1C): Hemoglobin A1C: 5.3 % (ref 4.0–5.6)

## 2023-09-22 MED ORDER — SEMAGLUTIDE(0.25 OR 0.5MG/DOS) 2 MG/3ML ~~LOC~~ SOPN
0.5000 mg | PEN_INJECTOR | SUBCUTANEOUS | Status: DC
Start: 2023-09-22 — End: 2024-02-24

## 2023-09-22 NOTE — Patient Instructions (Signed)
Heather Sweeney , Thank you for taking time to come for your Medicare Wellness Visit. I appreciate your ongoing commitment to your health goals. Please review the following plan we discussed and let me know if I can assist you in the future.   Referrals/Orders/Follow-Ups/Clinician Recommendations: None  This is a list of the screening recommended for you and due dates:  Health Maintenance  Topic Date Due   COVID-19 Vaccine (3 - Pfizer risk series) 03/06/2020   Mammogram  12/19/2023   Medicare Annual Wellness Visit  09/21/2024   Pap with HPV screening  12/03/2027   Colon Cancer Screening  04/14/2029   DTaP/Tdap/Td vaccine (3 - Td or Tdap) 11/25/2031   Flu Shot  Completed   Hepatitis C Screening  Completed   HIV Screening  Completed   Zoster (Shingles) Vaccine  Completed   HPV Vaccine  Aged Out    Advanced directives: (Declined) Advance directive discussed with you today. Even though you declined this today, please call our office should you change your mind, and we can give you the proper paperwork for you to fill out.  Next Medicare Annual Wellness Visit scheduled for next year: Yes 09/27/24 @ 11:30  Managing Pain Without Opioids  Opioids are strong medicines used to treat moderate to severe pain. For some people, especially those who have long-term (chronic) pain, opioids may not be the best choice for pain management due to: Side effects like nausea, constipation, and sleepiness. The risk of addiction (opioid use disorder). The longer you take opioids, the greater your risk of addiction. Pain that lasts for more than 3 months is called chronic pain. Managing chronic pain usually requires more than one approach and is often provided by a team of health care providers working together (multidisciplinary approach). Pain management may be done at a pain management center or pain clinic. How to manage pain without the use of opioids Use non-opioid medicines Non-opioid medicines for pain may  include: Over-the-counter or prescription non-steroidal anti-inflammatory drugs (NSAIDs). These may be the first medicines used for pain. They work well for muscle and bone pain, and they reduce swelling. Acetaminophen. This over-the-counter medicine may work well for milder pain but not swelling. Antidepressants. These may be used to treat chronic pain. A certain type of antidepressant (tricyclics) is often used. These medicines are given in lower doses for pain than when used for depression. Anticonvulsants. These are usually used to treat seizures but may also reduce nerve (neuropathic) pain. Muscle relaxants. These relieve pain caused by sudden muscle tightening (spasms). You may also use a pain medicine that is applied to the skin as a patch, cream, or gel (topical analgesic), such as a numbing medicine. These may cause fewer side effects than medicines taken by mouth. Do certain therapies as directed Some therapies can help with pain management. They include: Physical therapy. You will do exercises to gain strength and flexibility. A physical therapist may teach you exercises to move and stretch parts of your body that are weak, stiff, or painful. You can learn these exercises at physical therapy visits and practice them at home. Physical therapy may also involve: Massage. Heat wraps or applying heat or cold to affected areas. Electrical signals that interrupt pain signals (transcutaneous electrical nerve stimulation, TENS). Weak lasers that reduce pain and swelling (low-level laser therapy). Signals from your body that help you learn to regulate pain (biofeedback). Occupational therapy. This helps you to learn ways to function at home and work with less pain. Recreational therapy.  This involves trying new activities or hobbies, such as a physical activity or drawing. Mental health therapy, including: Cognitive behavioral therapy (CBT). This helps you learn coping skills for dealing with  pain. Acceptance and commitment therapy (ACT) to change the way you think and react to pain. Relaxation therapies, including muscle relaxation exercises and mindfulness-based stress reduction. Pain management counseling. This may be individual, family, or group counseling.  Receive medical treatments Medical treatments for pain management include: Nerve block injections. These may include a pain blocker and anti-inflammatory medicines. You may have injections: Near the spine to relieve chronic back or neck pain. Into joints to relieve back or joint pain. Into nerve areas that supply a painful area to relieve body pain. Into muscles (trigger point injections) to relieve some painful muscle conditions. A medical device placed near your spine to help block pain signals and relieve nerve pain or chronic back pain (spinal cord stimulation device). Acupuncture. Follow these instructions at home Medicines Take over-the-counter and prescription medicines only as told by your health care provider. If you are taking pain medicine, ask your health care providers about possible side effects to watch out for. Do not drive or use heavy machinery while taking prescription opioid pain medicine. Lifestyle  Do not use drugs or alcohol to reduce pain. If you drink alcohol, limit how much you have to: 0-1 drink a day for women who are not pregnant. 0-2 drinks a day for men. Know how much alcohol is in a drink. In the [place] one drink equals one 12 oz bottle of beer (355 mL), one 5 oz glass of wine (148 mL), or one 1 oz glass of hard liquor (44 mL). Do not use any products that contain nicotine or tobacco. These products include cigarettes, chewing tobacco, and vaping devices, such as e-cigarettes. If you need help quitting, ask your health care provider. Eat a healthy diet and maintain a healthy weight. Poor diet and excess weight may make pain worse. Eat foods that are high in fiber. These include fresh  fruits and vegetables, whole grains, and beans. Limit foods that are high in fat and processed sugars, such as fried and sweet foods. Exercise regularly. Exercise lowers stress and may help relieve pain. Ask your health care provider what activities and exercises are safe for you. If your health care provider approves, join an exercise class that combines movement and stress reduction. Examples include yoga and tai chi. Get enough sleep. Lack of sleep may make pain worse. Lower stress as much as possible. Practice stress reduction techniques as told by your therapist. General instructions Work with all your pain management providers to find the treatments that work best for you. You are an important member of your pain management team. There are many things you can do to reduce pain on your own. Consider joining an online or in-person support group for people who have chronic pain. Keep all follow-up visits. This is important. Where to find more information You can find more information about managing pain without opioids from: American Academy of Pain Medicine: painmed.org Institute for Chronic Pain: instituteforchronicpain.org American Chronic Pain Association: theacpa.org Contact a health care provider if: You have side effects from pain medicine. Your pain gets worse or does not get better with treatments or home therapy. You are struggling with anxiety or depression. Summary Many types of pain can be managed without opioids. Chronic pain may respond better to pain management without opioids. Pain is best managed when you and a team  of health care providers work together. Pain management without opioids may include non-opioid medicines, medical treatments, physical therapy, mental health therapy, and lifestyle changes. Tell your health care providers if your pain gets worse or is not being managed well enough. This information is not intended to replace advice given to you by your health  care provider. Make sure you discuss any questions you have with your health care provider. Document Revised: 01/02/2021 Document Reviewed: 01/02/2021 Elsevier Patient Education  2024 [employer].

## 2023-09-22 NOTE — Progress Notes (Signed)
Subjective:   Heather Sweeney is a 57 y.o. female who presents for Medicare Annual (Subsequent) preventive examination.  Visit Complete: Virtual I connected with  Heather Sweeney on 09/22/23 by a audio enabled telemedicine application and verified that I am speaking with the correct person using two identifiers.  Patient Location: Home  Provider Location: Office/Clinic  I discussed the limitations of evaluation and management by telemedicine. The patient expressed understanding and agreed to proceed.  Vital Signs: Because this visit was a virtual/telehealth visit, some criteria may be missing or patient reported. Any vitals not documented were not able to be obtained and vitals that have been documented are patient reported.   Cardiac Risk Factors include: dyslipidemia;hypertension;obesity (BMI >30kg/m2)     Objective:    Today's Vitals   09/22/23 1546 09/22/23 1547  Weight: 232 lb (105.2 kg)   Height: 5\' 3"  (1.6 m)   PainSc:  8    Body mass index is 41.1 kg/m.     09/22/2023    4:02 PM 07/10/2023    9:38 AM 06/19/2022    9:33 AM 05/17/2022   12:45 PM 02/25/2021    9:24 AM 02/15/2021   10:20 AM 01/04/2021    4:31 PM  Advanced Directives  Does Patient Have a Medical Advance Directive? Yes No No No No No No  Type of Estate agent of [place];Living will        Copy of Healthcare Power of Attorney in Chart? No - copy requested        Would patient like information on creating a medical advance directive?  Yes (MAU/Ambulatory/Procedural Areas - Information given) No - Patient declined No - Patient declined Yes (MAU/Ambulatory/Procedural Areas - Information given) No - Patient declined     Current Medications (verified) Outpatient Encounter Medications as of 09/22/2023  Medication Sig   ACCU-CHEK GUIDE test strip Use as instructed   albuterol (VENTOLIN HFA) 108 (90 Base) MCG/ACT inhaler Inhale 1-2 puffs into the lungs every 6 (six) hours as needed for  wheezing or shortness of breath.   amLODipine (NORVASC) 5 MG tablet Take 1 tablet (5 mg total) by mouth daily.   benztropine (COGENTIN) 1 MG tablet Take 1 mg by mouth 2 (two) times daily.   Blood Glucose Monitoring Suppl (ACCU-CHEK GUIDE ME) w/Device KIT Use as directed,once daily to test blood sugar   buPROPion (WELLBUTRIN XL) 150 MG 24 hr tablet Take 300 mg by mouth every morning.   busPIRone (BUSPAR) 15 MG tablet Take 15 mg by mouth 2 (two) times daily.   EPINEPHrine 0.3 mg/0.3 mL IJ SOAJ injection Inject 0.3 mg into the muscle as needed for anaphylaxis.   famotidine (PEPCID) 40 MG tablet Take 1 tablet (40 mg total) by mouth daily.   FLUoxetine (PROZAC) 40 MG capsule Take 40 mg by mouth daily.   fluPHENAZine (PROLIXIN) 5 MG tablet Take 5 mg by mouth 2 (two) times daily.   fluticasone (FLONASE) 50 MCG/ACT nasal spray Place 2 sprays into both nostrils daily.   hydrOXYzine (ATARAX) 50 MG tablet Take 50 mg by mouth 2 (two) times daily as needed for anxiety.   Insulin Pen Needle (NOVOFINE) 30G X 8 MM MISC Use with victoza daily 1.8 mg   metoprolol succinate (TOPROL-XL) 25 MG 24 hr tablet Take 25 mg by mouth daily.   metroNIDAZOLE (FLAGYL) 500 MG tablet Take 1 tab BID for 7 days; NO alcohol use for 10 days after prescription start   Multiple Vitamins-Minerals (ALIVE MULTI-VITAMIN  PO) Take by mouth daily in the afternoon.   nicotine (NICODERM CQ - DOSED IN MG/24 HOURS) 14 mg/24hr patch Place 1 patch (14 mg total) onto the skin daily.   omalizumab (XOLAIR) 150 MG/ML prefilled syringe    omeprazole (PRILOSEC) 40 MG capsule Take 1 capsule (40 mg total) by mouth 2 times daily at 12 noon and 4 pm.   oxyCODONE-acetaminophen (PERCOCET) 10-325 MG tablet Take 1 tablet by mouth 3 (three) times daily as needed for pain.   scopolamine (TRANSDERM-SCOP) 1 MG/3DAYS Place 1 patch (1.5 mg total) onto the skin every 3 (three) days. 4 hours before you get on boat   Semaglutide,0.25 or 0.5MG /DOS, 2 MG/3ML SOPN Inject  0.5 mg into the skin once a week.   topiramate (TOPAMAX) 25 MG tablet Take 1 tablet (25 mg total) by mouth 2 (two) times daily.   vitamin B-12 (CYANOCOBALAMIN) 1000 MCG tablet Take 1,000 mcg by mouth daily.   No facility-administered encounter medications on file as of 09/22/2023.    Allergies (verified) Cozaar [losartan], Keflex [cephalexin], Penicillins, Zestril [lisinopril], Hctz [hydrochlorothiazide], Bee venom, and Metformin and related   History: Past Medical History:  Diagnosis Date   Abnormal MRI, lumbar spine 02/12/2021   Abrasion of anterior left lower leg 07/10/2022   Allergic rhinitis    Annual physical exam 04/20/2020   Anxiety    Aperistalsis, esophagus 10/25/2020   Arthritis of knee, right    Arthritis of right knee 10/25/2020   Asthma    Bacterial vaginosis 10/20/2019   Bipolar disorder (HCC)    BMI 37.0-37.9, adult 03/07/2022   BMI 40.0-44.9, adult (HCC) 06/17/2022   Bronchiectasis without complication (HCC) 04/04/2022   Chronic low back pain    Chronic midline low back pain 12/17/2021   Class 3 severe obesity due to excess calories in adult [facility]) 04/09/2022   CTS (carpal tunnel syndrome)    hands   Depression    Early satiety 02/11/2022   Elevated liver enzymes 08/17/2020   Encounter for screening colonoscopy    Encounter for screening for COVID-19 10/10/2022   Frequent PVCs 09/24/2016   Gallstones    GERD (gastroesophageal reflux disease)    History of prediabetes    Hives 02/23/2019   Hypertension    Hypokalemia 12/13/2021   Incontinence of feces 02/12/2021   Ingrown right big toenail 04/20/2020   Iron deficiency anemia 12/13/2021   Laceration of right foot    Lymphedema 08/24/2022   Obesity (BMI 30-39.9) 09/10/2021   Onychomycosis    OSA on CPAP    does not use cpap   Osteoarthritis of knee 12/17/2021   Overactive bladder    Pain and swelling of right knee 04/20/2020   Pre-diabetes    diet controlled   PVC's (premature ventricular  contractions)    PVC's (premature ventricular contractions)    Dr. Kirtland Bouchard cards   S/P laparoscopic cholecystectomy April 2019 01/12/2018   Seizures (HCC)    8th grade   Tachycardia 10/10/2022   Urticaria 02/23/2019   Weight loss 02/11/2022   Past Surgical History:  Procedure Laterality Date   ABDOMINAL HYSTERECTOMY  07/08/2011   CARPAL TUNNEL RELEASE     right hand 1996   CESAREAN SECTION     2012   CHOLECYSTECTOMY N/A 01/12/2018   Procedure: LAPAROSCOPIC CHOLECYSTECTOMY WITH INTRAOPERATIVE CHOLANGIOGRAM ERAS PATHWAY;  Surgeon: Luretha Murphy, MD;  Location: [facility] ORS;  Service: General;  Laterality: N/A;   COLONOSCOPY WITH PROPOFOL N/A 04/15/2019   Procedure: COLONOSCOPY WITH PROPOFOL;  Surgeon:  [name], MD;  Location: [facility] ENDOSCOPY;  Service: Gastroenterology;  Laterality: N/A;   DILATION AND CURETTAGE OF UTERUS     04/2011 benign endometrial polyp squamous metaplasia, inflamm, blood, mucous, benign    ESOPHAGOGASTRODUODENOSCOPY (EGD) WITH PROPOFOL N/A 10/23/2020   Procedure: ESOPHAGOGASTRODUODENOSCOPY (EGD) WITH PROPOFOL;  Surgeon: [name], MD;  Location: [facility] ENDOSCOPY;  Service: Gastroenterology;  Laterality: N/A;   FLEXIBLE SIGMOIDOSCOPY N/A 02/15/2021   Procedure: FLEXIBLE SIGMOIDOSCOPY;  Surgeon: [name], MD;  Location: [facility] ENDOSCOPY;  Service: Gastroenterology;  Laterality: N/A;   FOOT SURGERY     Dr. Stacie Acres    LEEP     2006   PANNICULECTOMY N/A 04/24/2022   Procedure: INFRAUMBILICAL PANNICULECTOMY;  Surgeon: Peggye Form, DO;  Location: [facility] OR;  Service: Plastics;  Laterality: N/A;   Right hand carpal tunnel release Right 10/2022   scar tissue repair  04/2023   scar tissue from having children and tummy tuck   TUBAL LIGATION     Family History  Problem Relation Age of Onset   Heart failure Mother    Hypertension Mother    Early death Mother 66   Heart disease Mother 63       chf   Cancer Father 23       esophageal    Hypertension Father    Breast  cancer Sister 84   Mental illness Sister    Heart failure Sister    Asthma Son    Cancer Paternal Aunt        brain cancer   Thyroid cancer Neg Hx    Social History   Socioeconomic History   Marital status: Married    Spouse name: Not on file   Number of children: Not on file   Years of education: Not on file   Highest education level: Not on file  Occupational History   Not on file  Tobacco Use   Smoking status: Never   Smokeless tobacco: Current    Types: Chew   Tobacco comments:    She uses dip daily. She hasn't smoke cigarettes.  Vaping Use   Vaping status: Never Used  Substance and Sexual Activity   Alcohol use: No    Alcohol/week: 0.0 standard drinks of alcohol   Drug use: No   Sexual activity: Not Currently    Birth control/protection: Surgical    Comment: Hysterectomy  Other Topics Concern   Not on file  Social History Narrative   Married    No guns    Wears seat belt    Safe in relationship    Never smoker    2 sons Judie Grieve ~10 and another son ~28 as of 10/2019   1 granson eli 1 y.o as of 10/2019   2 sisters    Unemployed       Social Drivers of Corporate investment banker Strain: Low Risk  (09/22/2023)   Overall Financial Resource Strain (CARDIA)    Difficulty of Paying Living Expenses: Not hard at all  Food Insecurity: No Food Insecurity (09/22/2023)   Hunger Vital Sign    Worried About Running Out of Food in the Last Year: Never true    Ran Out of Food in the Last Year: Never true  Transportation Needs: No Transportation Needs (09/22/2023)   PRAPARE - Administrator, Civil Service (Medical): No    Lack of Transportation (Non-Medical): No  Physical Activity: Inactive (09/22/2023)   Exercise Vital Sign    Days of Exercise per  Week: 0 days    Minutes of Exercise per Session: 0 min  Stress: Stress Concern Present (09/22/2023)   [employer] of Occupational Health - Occupational Stress Questionnaire    Feeling of Stress : To some  extent  Social Connections: Socially Integrated (09/22/2023)   Social Connection and Isolation Panel [NHANES]    Frequency of Communication with Friends and Family: More than three times a week    Frequency of Social Gatherings with Friends and Family: More than three times a week    Attends Religious Services: More than 4 times per year    Active Member of [employer] or Organizations: Yes    Attends Engineer, structural: More than 4 times per year    Marital Status: Married    Tobacco Counseling Ready to quit: Not Answered Counseling given: Not Answered Tobacco comments: She uses dip daily. She hasn't smoke cigarettes.   Clinical Intake:  Pre-visit preparation completed: Yes  Pain : 0-10 Pain Score: 8  Pain Type: Chronic pain Pain Location: Knee Pain Orientation: Right Pain Descriptors / Indicators: Aching Pain Onset: More than a month ago Pain Frequency: Intermittent     Nutritional Status: BMI > 30  Obese Nutritional Risks: Nausea/ vomitting/ diarrhea (Sees Dr. Tobi Bastos for the diarrhea) Diabetes: No  How often do you need to have someone help you when you read instructions, pamphlets, or other written materials from your doctor or pharmacy?: 1 - Never  Interpreter Needed?: No  Information entered by :: R. Cynthie Garmon LPN   Activities of Daily Living    09/22/2023    3:52 PM  In your present state of health, do you have any difficulty performing the following activities:  Hearing? 1  Comment wears aids  Vision? 0  Comment glasses  Difficulty concentrating or making decisions? 1  Walking or climbing stairs? 1  Dressing or bathing? 0  Doing errands, shopping? 1  Preparing Food and eating ? N  Using the Toilet? N  In the past six months, have you accidently leaked urine? N  Do you have problems with loss of bowel control? Y  Managing your Medications? N  Managing your Finances? N  Housekeeping or managing your Housekeeping? N    Patient Care Team: Allegra Grana, FNP as PCP - General (Family Medicine)  Indicate any recent Medical Services you may have received from other than [facility] providers in the past year (date may be approximate).     Assessment:   This is a routine wellness examination for Heather Sweeney.  Hearing/Vision screen Hearing Screening - Comments:: Wears aids Vision Screening - Comments:: glasses   Goals Addressed             This Visit's Progress    Patient Stated       Wants to continue to lose weight       Depression Screen    09/22/2023    4:03 PM 09/22/2023   10:42 AM 07/02/2023   10:51 AM 11/05/2022   10:24 AM 10/16/2022    2:00 PM 09/23/2022   11:03 AM 07/10/2022    2:25 PM  PHQ 2/9 Scores  PHQ - 2 Score 0 0 2 2 1  0 0  PHQ- 9 Score 7  10 7 5  0     Fall Risk    09/22/2023    3:54 PM 09/22/2023   10:42 AM 09/02/2023   11:49 AM 07/02/2023   10:50 AM 11/05/2022   10:24 AM  Fall Risk   Falls  in the past year? 1 0 1 0 0  Number falls in past yr: 1 0 1  0  Injury with Fall? 1 0 1  0  Risk for fall due to : History of fall(s);Impaired balance/gait;Impaired mobility No Fall Risks History of fall(s)  No Fall Risks  Follow up Falls evaluation completed;Falls prevention discussed Falls evaluation completed Falls evaluation completed  Falls evaluation completed    MEDICARE RISK AT HOME: Medicare Risk at Home Any stairs in or around the home?: No If so, are there any without handrails?: No Home free of loose throw rugs in walkways, pet beds, electrical cords, etc?: Yes Adequate lighting in your home to reduce risk of falls?: Yes Life alert?: No Use of a cane, walker or w/c?: Yes Grab bars in the bathroom?: No Shower chair or bench in shower?: No Elevated toilet seat or a handicapped toilet?: No  Cognitive Function:        09/22/2023    4:06 PM 06/19/2022    9:38 AM 10/01/2020   10:56 AM 07/07/2019   11:35 AM  6CIT Screen  What Year? 0 points 0 points 0 points 0 points  What month? 0 points 0  points 0 points 0 points  What time? 0 points 0 points 0 points 0 points  Count back from 20 0 points 0 points 0 points 0 points  Months in reverse 2 points 4 points 4 points 0 points  Repeat phrase 8 points 4 points 4 points   Total Score 10 points 8 points 8 points     Immunizations Immunization History  Administered Date(s) Administered   Influenza,inj,Quad PF,6+ Mos 07/12/2016, 06/09/2019, 08/01/2021, 06/25/2022   Influenza-Unspecified 05/06/2020, 08/26/2023   PFIZER(Purple Top)SARS-COV-2 Vaccination 01/17/2020, 02/07/2020   Pneumococcal Conjugate-13 09/10/2021   Pneumococcal Polysaccharide-23 07/12/2016   Tdap 04/17/2009, 11/24/2021   Zoster Recombinant(Shingrix) 08/01/2021, 11/24/2021    TDAP status: Up to date  Flu Vaccine status: Up to date  Pneumococcal vaccine status: Up to date  Covid-19 vaccine status: Information provided on how to obtain vaccines.   Qualifies for Shingles Vaccine? Yes   Zostavax completed No   Shingrix Completed?: Yes  Screening Tests Health Maintenance  Topic Date Due   COVID-19 Vaccine (3 - Pfizer risk series) 03/06/2020   Medicare Annual Wellness (AWV)  06/20/2023   MAMMOGRAM  12/19/2023   Cervical Cancer Screening (HPV/Pap Cotest)  12/03/2027   Colonoscopy  04/14/2029   DTaP/Tdap/Td (3 - Td or Tdap) 11/25/2031   INFLUENZA VACCINE  Completed   Hepatitis C Screening  Completed   HIV Screening  Completed   Zoster Vaccines- Shingrix  Completed   HPV VACCINES  Aged Out    Health Maintenance  Health Maintenance Due  Topic Date Due   COVID-19 Vaccine (3 - Pfizer risk series) 03/06/2020   Medicare Annual Wellness (AWV)  06/20/2023    Colorectal cancer screening: Type of screening: Colonoscopy. Completed 04/2019. Repeat every 10 years  Mammogram status: Completed 12/2022. Repeat every year   Lung Cancer Screening: (Low Dose CT Chest recommended if Age 34-80 years, 20 pack-year currently smoking OR have quit w/in 15years.) does not  qualify.    Additional Screening:  Hepatitis C Screening: does qualify; Completed 08/2020  Vision Screening: Recommended annual ophthalmology exams for early detection of glaucoma and other disorders of the eye. Is the patient up to date with their annual eye exam?  Yes  Who is the provider or what is the name of the office in which the patient  attends annual eye exams? [facility]  If pt is not established with a provider, would they like to be referred to a provider to establish care? No .   Dental Screening: Recommended annual dental exams for proper oral hygiene    Community Resource Referral / Chronic Care Management: CRR required this visit?  No   CCM required this visit?  No     Plan:     I have personally reviewed and noted the following in the patient's chart:   Medical and social history Use of alcohol, tobacco or illicit drugs  Current medications and supplements including opioid prescriptions. Patient is currently taking opioid prescriptions. Information provided to patient regarding non-opioid alternatives. Patient advised to discuss non-opioid treatment plan with their provider. Functional ability and status Nutritional status Physical activity Advanced directives List of other physicians Hospitalizations, surgeries, and ER visits in previous 12 months Vitals Screenings to include cognitive, depression, and falls Referrals and appointments  In addition, I have reviewed and discussed with patient certain preventive protocols, quality metrics, and best practice recommendations. A written personalized care plan for preventive services as well as general preventive health recommendations were provided to patient.     Sydell Axon, LPN   [id number]   After Visit Summary: (MyChart) Due to this being a telephonic visit, the after visit summary with patients personalized plan was offered to patient via MyChart   Nurse Notes: None

## 2023-09-22 NOTE — Progress Notes (Signed)
Assessment & Plan:  Elevated glucose -     POCT glycosylated hemoglobin (Hb A1C) -     Microalbumin / creatinine urine ratio -     Semaglutide(0.25 or 0.5MG /DOS); Inject 0.5 mg into the skin once a week.  Enlarged clavicle Assessment & Plan: Incidental enlarged left clavicle on exam.  I did not appreciate lymphadenopathy with palpation.  Pending x-ray left clavicle.  Consider CT head and neck  Orders: -     DG Clavicle Left; Future  Essential hypertension Assessment & Plan: Chronic, stable.  Continue amlodipine 5mg  , Toprol 25 mg daily  Orders: -     TSH -     CBC with Differential/Platelet -     Comprehensive metabolic panel  Hyperlipidemia, unspecified hyperlipidemia type -     Lipid panel  Morbid obesity with BMI of 40.0-44.9, adult (HCC) -     VITAMIN D 25 Hydroxy (Vit-D Deficiency, Fractures) -     Semaglutide(0.25 or 0.5MG /DOS); Inject 0.5 mg into the skin once a week.  Encounter for screening mammogram for malignant neoplasm of breast -     3D Screening Mammogram, Left and Right; Future  Anxiety and depression Assessment & Plan: Chronic, symptomatically stable . following with psychiatry, [facility], Dr Michae Kava for history of bipolar, anxiety and depression.  Continue benzatropine, bupropion, BuSpar, Atarax, prozac, topmax.  Medications and doses reviewed extensively today.  Will follow   History of prediabetes Assessment & Plan: Chronic, excellent control.  She is no longer Victoza.  Continue Ozempic 0.5 mg once weekly.  Consider escalation in the future for additional benefit of weight loss.      Return precautions given.   Risks, benefits, and alternatives of the medications and treatment plan prescribed today were discussed, and patient expressed understanding.   Education regarding symptom management and diagnosis given to patient on AVS either electronically or printed.  Return in about 3 months (around 12/21/2023) for  Medicare Wellness upcoming or due, schedule.  Rennie Plowman, FNP  Subjective:    Patient ID: Heather Sweeney, female    DOB: May 02, 1966, 57 y.o.   MRN: [id number]  CC: Heather Sweeney is a 57 y.o. female who presents today for follow up and to establish care. Previous visit had been virtual 09/02/23.    HPI: Establish care, Previously followed with Dr Sherrin Daisy as PCP and our clinic periodically  Feels well today.  No new complaints.  She has brought all of her medications with her       Compliant amlodipine 5mg  , Toprol 25 mg daily  She is no longer Victoza.  She is doing well on Ozempic 0.5 mg once weekly.  Denies constipation, nausea  She has not had any issues with her allergies of late.  Denies leg swelling.  Follows with [facility], GYN.  Pap smear up-to-date 12/02/22  Colonoscopy up-to-date.  Due mammogram  Following with psychiatry, [facility], Dr Michae Kava for history of bipolar, anxiety and depression  Following with [facility], Dr Annette Stable, phone [phone] for prescription of Percocet, chronic low back pain  Following [facility], right knee pain  No family or personal  of MEN or thyroid cancer  History of CKD  Lab Results  Component Value Date   HGBA1C 5.3 09/22/2023   HGBA1C 5.2 07/10/2022   HGBA1C 5.2 08/01/2021     Allergies: Cozaar [losartan], Keflex [cephalexin], Penicillins, Zestril [lisinopril], Hctz [hydrochlorothiazide], Bee venom, and Metformin and  related Current Outpatient Medications on File Prior to Visit  Medication Sig Dispense Refill   ACCU-CHEK GUIDE test strip Use as instructed 100 strip 3   albuterol (VENTOLIN HFA) 108 (90 Base) MCG/ACT inhaler Inhale 1-2 puffs into the lungs every 6 (six) hours as needed for wheezing or shortness of breath. 18 g 11   amLODipine (NORVASC) 5 MG tablet Take 1 tablet (5 mg total) by mouth daily. 90 tablet 3   benztropine (COGENTIN) 1 MG tablet Take 1  mg by mouth 2 (two) times daily.     Blood Glucose Monitoring Suppl (ACCU-CHEK GUIDE ME) w/Device KIT Use as directed,once daily to test blood sugar 1 kit 0   buPROPion (WELLBUTRIN XL) 150 MG 24 hr tablet Take 300 mg by mouth every morning.     busPIRone (BUSPAR) 15 MG tablet Take 15 mg by mouth 2 (two) times daily.     EPINEPHrine 0.3 mg/0.3 mL IJ SOAJ injection Inject 0.3 mg into the muscle as needed for anaphylaxis. 2 each 3   famotidine (PEPCID) 40 MG tablet Take 1 tablet (40 mg total) by mouth daily. 90 tablet 3   FLUoxetine (PROZAC) 40 MG capsule Take 40 mg by mouth daily.     fluPHENAZine (PROLIXIN) 5 MG tablet Take 5 mg by mouth 2 (two) times daily.     fluticasone (FLONASE) 50 MCG/ACT nasal spray Place 2 sprays into both nostrils daily. 48 g 3   hydrOXYzine (ATARAX) 50 MG tablet Take 50 mg by mouth 2 (two) times daily as needed for anxiety.     Insulin Pen Needle (NOVOFINE) 30G X 8 MM MISC Use with victoza daily 1.8 mg 90 each 3   metoprolol succinate (TOPROL-XL) 25 MG 24 hr tablet Take 25 mg by mouth daily.     metroNIDAZOLE (FLAGYL) 500 MG tablet Take 1 tab BID for 7 days; NO alcohol use for 10 days after prescription start 14 tablet 0   Multiple Vitamins-Minerals (ALIVE MULTI-VITAMIN PO) Take by mouth daily in the afternoon.     nicotine (NICODERM CQ - DOSED IN MG/24 HOURS) 14 mg/24hr patch Place 1 patch (14 mg total) onto the skin daily. 28 patch 0   omalizumab (XOLAIR) 150 MG/ML prefilled syringe      omeprazole (PRILOSEC) 40 MG capsule Take 1 capsule (40 mg total) by mouth 2 times daily at 12 noon and 4 pm. 120 capsule 0   oxyCODONE-acetaminophen (PERCOCET) 10-325 MG tablet Take 1 tablet by mouth 3 (three) times daily as needed for pain.     scopolamine (TRANSDERM-SCOP) 1 MG/3DAYS Place 1 patch (1.5 mg total) onto the skin every 3 (three) days. 4 hours before you get on boat 4 patch 0   topiramate (TOPAMAX) 25 MG tablet Take 1 tablet (25 mg total) by mouth 2 (two) times daily. 90  tablet 3   vitamin B-12 (CYANOCOBALAMIN) 1000 MCG tablet Take 1,000 mcg by mouth daily.     No current facility-administered medications on file prior to visit.    Review of Systems  Constitutional:  Negative for chills and fever.  Respiratory:  Negative for cough.   Cardiovascular:  Negative for chest pain and palpitations.  Gastrointestinal:  Negative for nausea and vomiting.      Objective:    BP 126/80   Pulse 80   Temp 98.8 F (37.1 C) (Oral)   Ht 5\' 3"  (1.6 m)   Wt 232 lb 9.6 oz (105.5 kg)   SpO2 96%   BMI 41.20 kg/m  BP Readings from Last 3 Encounters:  09/22/23 126/80  09/21/23 136/82  07/02/23 112/75   Wt Readings from Last 3 Encounters:  09/22/23 232 lb (105.2 kg)  09/22/23 232 lb 9.6 oz (105.5 kg)  09/21/23 235 lb (106.6 kg)      09/22/2023    4:03 PM 09/22/2023   10:42 AM 07/02/2023   10:51 AM  Depression screen PHQ 2/9  Decreased Interest 0 0 1  Down, Depressed, Hopeless 0 0 1  PHQ - 2 Score 0 0 2  Altered sleeping 1  1  Tired, decreased energy 1  1  Change in appetite 3  3  Feeling bad or failure about yourself  1  1  Trouble concentrating 1  1  Moving slowly or fidgety/restless 0  1  Suicidal thoughts 0  0  PHQ-9 Score 7  10  Difficult doing work/chores Very difficult  Not difficult at all    Physical Exam Vitals reviewed.  Constitutional:      Appearance: She is well-developed.  Eyes:     Conjunctiva/sclera: Conjunctivae normal.  Neck:     Thyroid: No thyroid mass, thyromegaly or thyroid tenderness.      Comments: Enlarged left clavicle.  No lymphadenopathy appreciated on exam Cardiovascular:     Rate and Rhythm: Normal rate and regular rhythm.     Pulses: Normal pulses.     Heart sounds: Normal heart sounds.  Pulmonary:     Effort: Pulmonary effort is normal.     Breath sounds: Normal breath sounds. No wheezing, rhonchi or rales.  Lymphadenopathy:     Head:     Right side of head: No submental, submandibular, tonsillar,  preauricular, posterior auricular or occipital adenopathy.     Left side of head: No submental, submandibular, tonsillar, preauricular, posterior auricular or occipital adenopathy.  Skin:    General: Skin is warm and dry.  Neurological:     Mental Status: She is alert.  Psychiatric:        Speech: Speech normal.        Behavior: Behavior normal.        Thought Content: Thought content normal.

## 2023-09-22 NOTE — Patient Instructions (Addendum)
In bubble pack  STOP Singulair ( this is for allergies)  STOP furosemide 20 mg ( this is for leg swelling)    I have printed our medication list , please ensure this matches the bubble pack. Do not take an additional dose of omeprazole as this is included in your bubble pack.   Please call  and schedule your 3D mammogram and /or bone density scan as we discussed.   [facility]  ( new location in 2023)  [address], [place], [place] [address]  [place], [place]  [phone]   Nice to meet you today!

## 2023-09-22 NOTE — Assessment & Plan Note (Signed)
Incidental enlarged left clavicle on exam.  I did not appreciate lymphadenopathy with palpation.  Pending x-ray left clavicle.  Consider CT head and neck

## 2023-09-22 NOTE — Assessment & Plan Note (Addendum)
Chronic, symptomatically stable . following with psychiatry, [facility], Dr Michae Kava for history of bipolar, anxiety and depression.  Continue benzatropine, bupropion, BuSpar, Atarax, prozac, topmax.  Medications and doses reviewed extensively today.  Will follow

## 2023-09-24 ENCOUNTER — Other Ambulatory Visit: Payer: Self-pay | Admitting: Family

## 2023-09-24 ENCOUNTER — Encounter: Payer: 59 | Admitting: Physical Therapy

## 2023-09-24 DIAGNOSIS — R899 Unspecified abnormal finding in specimens from other organs, systems and tissues: Secondary | ICD-10-CM

## 2023-09-25 ENCOUNTER — Ambulatory Visit
Admission: RE | Admit: 2023-09-25 | Discharge: 2023-09-25 | Disposition: A | Discharge status: Home / Self Care | Payer: 59 | Source: Ambulatory Visit | Attending: Gastroenterology | Admitting: Gastroenterology

## 2023-09-25 DIAGNOSIS — R1319 Other dysphagia: Secondary | ICD-10-CM | POA: Diagnosis present

## 2023-09-26 NOTE — Assessment & Plan Note (Addendum)
Chronic, excellent control.  She is no longer Victoza.  Continue Ozempic 0.5 mg once weekly.  Consider escalation in the future for additional benefit of weight loss.

## 2023-09-26 NOTE — Assessment & Plan Note (Signed)
Chronic, stable.  Continue amlodipine 5mg  , Toprol 25 mg daily

## 2023-09-28 ENCOUNTER — Other Ambulatory Visit: Payer: 59

## 2023-09-28 ENCOUNTER — Other Ambulatory Visit: Payer: Self-pay

## 2023-09-28 ENCOUNTER — Encounter: Payer: 59 | Admitting: Physical Therapy

## 2023-09-28 ENCOUNTER — Encounter: Payer: 59 | Attending: Physical Medicine and Rehabilitation | Admitting: Physical Medicine and Rehabilitation

## 2023-09-28 DIAGNOSIS — M25561 Pain in right knee: Secondary | ICD-10-CM | POA: Insufficient documentation

## 2023-09-28 DIAGNOSIS — G4701 Insomnia due to medical condition: Secondary | ICD-10-CM | POA: Insufficient documentation

## 2023-09-28 DIAGNOSIS — Z6841 Body Mass Index (BMI) 40.0 and over, adult: Secondary | ICD-10-CM | POA: Insufficient documentation

## 2023-09-28 DIAGNOSIS — M25562 Pain in left knee: Secondary | ICD-10-CM | POA: Insufficient documentation

## 2023-09-28 DIAGNOSIS — R899 Unspecified abnormal finding in specimens from other organs, systems and tissues: Secondary | ICD-10-CM

## 2023-09-28 DIAGNOSIS — G8929 Other chronic pain: Secondary | ICD-10-CM | POA: Insufficient documentation

## 2023-09-29 ENCOUNTER — Other Ambulatory Visit (INDEPENDENT_AMBULATORY_CARE_PROVIDER_SITE_OTHER): Payer: 59

## 2023-09-29 ENCOUNTER — Ambulatory Visit (INDEPENDENT_AMBULATORY_CARE_PROVIDER_SITE_OTHER): Payer: 59

## 2023-09-29 DIAGNOSIS — R899 Unspecified abnormal finding in specimens from other organs, systems and tissues: Secondary | ICD-10-CM | POA: Diagnosis not present

## 2023-09-29 DIAGNOSIS — M89312 Hypertrophy of bone, left shoulder: Secondary | ICD-10-CM | POA: Diagnosis not present

## 2023-09-29 DIAGNOSIS — M89319 Hypertrophy of bone, unspecified shoulder: Secondary | ICD-10-CM

## 2023-09-29 LAB — URINALYSIS, ROUTINE W REFLEX MICROSCOPIC
Bilirubin Urine: NEGATIVE
Hgb urine dipstick: NEGATIVE
Ketones, ur: NEGATIVE
Leukocytes,Ua: NEGATIVE
Nitrite: NEGATIVE
Specific Gravity, Urine: 1.02 (ref 1.000–1.030)
Total Protein, Urine: NEGATIVE
Urine Glucose: NEGATIVE
Urobilinogen, UA: 0.2 (ref 0.0–1.0)
pH: 6 (ref 5.0–8.0)

## 2023-09-30 LAB — URINE CULTURE
MICRO NUMBER:: 15888348
SPECIMEN QUALITY:: ADEQUATE

## 2023-10-01 ENCOUNTER — Encounter: Payer: 59 | Admitting: Physical Therapy

## 2023-10-05 ENCOUNTER — Other Ambulatory Visit: Payer: Self-pay | Admitting: Family

## 2023-10-05 ENCOUNTER — Encounter: Payer: 59 | Admitting: Physical Therapy

## 2023-10-05 ENCOUNTER — Telehealth: Payer: Self-pay | Admitting: Family

## 2023-10-05 DIAGNOSIS — Z720 Tobacco use: Secondary | ICD-10-CM

## 2023-10-05 DIAGNOSIS — F17221 Nicotine dependence, chewing tobacco, in remission: Secondary | ICD-10-CM

## 2023-10-05 MED ORDER — NICOTINE POLACRILEX 4 MG MT GUM: 4.0000 mg | CHEWING_GUM | OROMUCOSAL | 1 refills | Status: DC | PRN

## 2023-10-05 MED ORDER — NICOTINE 21 MG/24HR TD PT24
21.0000 mg | MEDICATED_PATCH | Freq: Every day | TRANSDERMAL | 2 refills | Status: DC
Start: 2023-10-05 — End: 2024-02-27

## 2023-10-05 NOTE — Telephone Encounter (Signed)
Spoke to pt and she is smoking 2 packs daily

## 2023-10-05 NOTE — Telephone Encounter (Signed)
Call patient I received a refill request from [facility].  I would like to clarify how many cigarettes a day she is smoking so that I may properly dose the NicoDerm patches.  If she is smoking a full pack a day I would prescribe 21 mg, if 1/2 pack or less I would prescribe 14mg .  I did refill nicotine gum

## 2023-10-06 ENCOUNTER — Ambulatory Visit: Payer: Self-pay | Admitting: Family Medicine

## 2023-10-08 ENCOUNTER — Ambulatory Visit (INDEPENDENT_AMBULATORY_CARE_PROVIDER_SITE_OTHER): Payer: 59 | Admitting: Nurse Practitioner

## 2023-10-08 ENCOUNTER — Encounter: Payer: 59 | Admitting: Physical Therapy

## 2023-10-08 ENCOUNTER — Encounter (INDEPENDENT_AMBULATORY_CARE_PROVIDER_SITE_OTHER): Payer: Self-pay | Admitting: Nurse Practitioner

## 2023-10-08 VITALS — BP 136/80 | HR 91 | Resp 18 | Ht 62.0 in | Wt 235.0 lb

## 2023-10-08 DIAGNOSIS — I890 Lymphedema, not elsewhere classified: Secondary | ICD-10-CM | POA: Diagnosis not present

## 2023-10-08 DIAGNOSIS — M170 Bilateral primary osteoarthritis of knee: Secondary | ICD-10-CM

## 2023-10-08 DIAGNOSIS — I10 Essential (primary) hypertension: Secondary | ICD-10-CM

## 2023-10-09 ENCOUNTER — Encounter (INDEPENDENT_AMBULATORY_CARE_PROVIDER_SITE_OTHER): Payer: Self-pay | Admitting: Nurse Practitioner

## 2023-10-09 NOTE — Progress Notes (Signed)
 Subjective:    Patient ID: Heather Sweeney, female    DOB: 1966-03-09, 58 y.o.   MRN: [id number] Chief Complaint  Patient presents with   Follow-up    f/u in 1 year with no studies    The patient returns to the office for followup evaluation regarding leg swelling.  The swelling has persisted and the pain associated with swelling continues. There have not been any interval development of a ulcerations or wounds.  Since the previous visit the patient has been wearing graduated compression stockings and has noted little if any improvement in the lymphedema. The patient has been using compression routinely morning until night.  The patient also states elevation during the day and exercise is being done too.      Review of Systems  Cardiovascular:  Positive for leg swelling.  Musculoskeletal:  Positive for arthralgias.  All other systems reviewed and are negative.      Objective:   Physical Exam Vitals reviewed.  HENT:     Head: Normocephalic.  Cardiovascular:     Rate and Rhythm: Normal rate.  Pulmonary:     Effort: Pulmonary effort is normal.  Musculoskeletal:     Right lower leg: Edema present.     Left lower leg: Edema present.  Skin:    General: Skin is warm and dry.  Neurological:     Mental Status: She is alert and oriented to person, place, and time.  Psychiatric:        Mood and Affect: Mood normal.        Behavior: Behavior normal.        Thought Content: Thought content normal.        Judgment: Judgment normal.     BP 136/80   Pulse 91   Resp 18   Ht 5' 2 (1.575 m)   Wt 235 lb (106.6 kg)   BMI 42.98 kg/m   Past Medical History:  Diagnosis Date   Abnormal MRI, lumbar spine 02/12/2021   Abrasion of anterior left lower leg 07/10/2022   Allergic rhinitis    Annual physical exam 04/20/2020   Anxiety    Aperistalsis, esophagus 10/25/2020   Arthritis of knee, right    Arthritis of right knee 10/25/2020   Asthma    Bacterial vaginosis 10/20/2019    Bipolar disorder (HCC)    BMI 37.0-37.9, adult 03/07/2022   BMI 40.0-44.9, adult (HCC) 06/17/2022   Bronchiectasis without complication (HCC) 04/04/2022   Chronic low back pain    Chronic midline low back pain 12/17/2021   Class 3 severe obesity due to excess calories in adult [facility]) 04/09/2022   CTS (carpal tunnel syndrome)    hands   Depression    Early satiety 02/11/2022   Elevated liver enzymes 08/17/2020   Encounter for screening colonoscopy    Encounter for screening for COVID-19 10/10/2022   Frequent PVCs 09/24/2016   Gallstones    GERD (gastroesophageal reflux disease)    History of prediabetes    Hives 02/23/2019   Hypertension    Hypokalemia 12/13/2021   Incontinence of feces 02/12/2021   Ingrown right big toenail 04/20/2020   Iron deficiency anemia 12/13/2021   Laceration of right foot    Lymphedema 08/24/2022   Obesity (BMI 30-39.9) 09/10/2021   Onychomycosis    OSA on CPAP    does not use cpap   Osteoarthritis of knee 12/17/2021   Overactive bladder    Pain and swelling of right knee 04/20/2020   Pre-diabetes  diet controlled   PVC's (premature ventricular contractions)    PVC's (premature ventricular contractions)    Dr. MARLA cards   S/P laparoscopic cholecystectomy April 2019 01/12/2018   Seizures (HCC)    8th grade   Tachycardia 10/10/2022   Urticaria 02/23/2019   Weight loss 02/11/2022    Social History   Socioeconomic History   Marital status: Married    Spouse name: Not on file   Number of children: Not on file   Years of education: Not on file   Highest education level: Not on file  Occupational History   Not on file  Tobacco Use   Smoking status: Never   Smokeless tobacco: Current    Types: Chew   Tobacco comments:    She uses dip daily. She hasn't smoke cigarettes.  Vaping Use   Vaping status: Never Used  Substance and Sexual Activity   Alcohol  use: No    Alcohol /week: 0.0 standard drinks of alcohol    Drug use: No   Sexual  activity: Not Currently    Birth control/protection: Surgical    Comment: Hysterectomy  Other Topics Concern   Not on file  Social History Narrative   Married    No guns    Wears seat belt    Safe in relationship    Never smoker    2 sons Dorise ~10 and another son ~28 as of 10/2019   1 granson eli 1 y.o as of 10/2019   2 sisters    Unemployed       Social Drivers of Corporate Investment Banker Strain: Low Risk  (09/22/2023)   Overall Financial Resource Strain (CARDIA)    Difficulty of Paying Living Expenses: Not hard at all  Food Insecurity: No Food Insecurity (09/22/2023)   Hunger Vital Sign    Worried About Running Out of Food in the Last Year: Never true    Ran Out of Food in the Last Year: Never true  Transportation Needs: No Transportation Needs (09/22/2023)   PRAPARE - Administrator, Civil Service (Medical): No    Lack of Transportation (Non-Medical): No  Physical Activity: Inactive (09/22/2023)   Exercise Vital Sign    Days of Exercise per Week: 0 days    Minutes of Exercise per Session: 0 min  Stress: Stress Concern Present (09/22/2023)   [employer] of Occupational Health - Occupational Stress Questionnaire    Feeling of Stress : To some extent  Social Connections: Socially Integrated (09/22/2023)   Social Connection and Isolation Panel [NHANES]    Frequency of Communication with Friends and Family: More than three times a week    Frequency of Social Gatherings with Friends and Family: More than three times a week    Attends Religious Services: More than 4 times per year    Active Member of Clubs or Organizations: Yes    Attends Banker Meetings: More than 4 times per year    Marital Status: Married  Catering Manager Violence: Not At Risk (09/22/2023)   Humiliation, Afraid, Rape, and Kick questionnaire    Fear of Current or Ex-Partner: No    Emotionally Abused: No    Physically Abused: No    Sexually Abused: No    Past  Surgical History:  Procedure Laterality Date   ABDOMINAL HYSTERECTOMY  07/08/2011   CARPAL TUNNEL RELEASE     right hand 1996   CESAREAN SECTION     2012   CHOLECYSTECTOMY N/A 01/12/2018   Procedure:  LAPAROSCOPIC CHOLECYSTECTOMY WITH INTRAOPERATIVE CHOLANGIOGRAM ERAS PATHWAY;  Surgeon: Gladis Cough, MD;  Location: [facility] ORS;  Service: General;  Laterality: N/A;   COLONOSCOPY WITH PROPOFOL  N/A 04/15/2019   Procedure: COLONOSCOPY WITH PROPOFOL ;  Surgeon: Therisa Bi, MD;  Location: [facility] ENDOSCOPY;  Service: Gastroenterology;  Laterality: N/A;   DILATION AND CURETTAGE OF UTERUS     04/2011 benign endometrial polyp squamous metaplasia, inflamm, blood, mucous, benign    ESOPHAGOGASTRODUODENOSCOPY (EGD) WITH PROPOFOL  N/A 10/23/2020   Procedure: ESOPHAGOGASTRODUODENOSCOPY (EGD) WITH PROPOFOL ;  Surgeon: Therisa Bi, MD;  Location: [facility] ENDOSCOPY;  Service: Gastroenterology;  Laterality: N/A;   FLEXIBLE SIGMOIDOSCOPY N/A 02/15/2021   Procedure: FLEXIBLE SIGMOIDOSCOPY;  Surgeon: Therisa Bi, MD;  Location: [facility] ENDOSCOPY;  Service: Gastroenterology;  Laterality: N/A;   FOOT SURGERY     Dr. Loreda    LEEP     2006   PANNICULECTOMY N/A 04/24/2022   Procedure: INFRAUMBILICAL PANNICULECTOMY;  Surgeon: Lowery Estefana RAMAN, DO;  Location: [facility] OR;  Service: Plastics;  Laterality: N/A;   Right hand carpal tunnel release Right 10/2022   scar tissue repair  04/2023   scar tissue from having children and tummy tuck   TUBAL LIGATION      Family History  Problem Relation Age of Onset   Heart failure Mother    Hypertension Mother    Early death Mother 52   Heart disease Mother 91       chf   Cancer Father 63       esophageal    Hypertension Father    Breast cancer Sister 42   Mental illness Sister    Heart failure Sister    Asthma Son    Cancer Paternal Aunt        brain cancer   Thyroid  cancer Neg Hx     Allergies  Allergen Reactions   Cozaar  [Losartan ] Hives and Swelling    Angioedema    Keflex [Cephalexin] Swelling and Rash   Penicillins Rash    Has patient had a PCN reaction causing immediate rash, facial/tongue/throat swelling, SOB or lightheadedness with hypotension: Yes Has patient had a PCN reaction causing severe rash involving mucus membranes or skin necrosis: No Has patient had a PCN reaction that required hospitalization: No Has patient had a PCN reaction occurring within the last 10 years: No If all of the above answers are NO, then may proceed with Cephalosporin use.    Zestril [Lisinopril] Hives and Other (See Comments)    Headache Low BP Angioedema     Hctz [Hydrochlorothiazide ] Swelling and Rash   Bee Venom Swelling   Metformin  And Related Rash       Latest Ref Rng & Units 09/22/2023   11:54 AM 05/17/2022   12:47 PM 04/24/2022   12:20 PM  CBC  WBC 4.0 - 10.5 K/uL 6.7  7.9  5.2   Hemoglobin 12.0 - 15.0 g/dL 88.2  9.3  88.5   Hematocrit 36.0 - 46.0 % 35.6  26.6  31.5   Platelets 150.0 - 400.0 K/uL 225.0  288  204       CMP     Component Value Date/Time   NA 139 09/22/2023 1154   NA 143 03/04/2022 1553   NA 138 09/17/2014 0549   K 3.4 (L) 09/22/2023 1154   K 4.0 09/17/2014 0549   CL 107 09/22/2023 1154   CL 103 09/17/2014 0549   CO2 26 09/22/2023 1154   CO2 28 09/17/2014 0549   GLUCOSE 90 09/22/2023 1154  GLUCOSE 105 (H) 09/17/2014 0549   BUN 9 09/22/2023 1154   BUN 8 03/04/2022 1553   BUN 9 09/17/2014 0549   CREATININE 1.21 (H) 09/22/2023 1154   CREATININE 1.01 05/19/2019 1511   CALCIUM 9.0 09/22/2023 1154   CALCIUM 9.2 09/17/2014 0549   PROT 6.7 09/22/2023 1154   PROT 6.6 03/04/2022 1553   PROT 7.4 04/04/2013 1451   ALBUMIN 4.1 09/22/2023 1154   ALBUMIN 4.3 03/04/2022 1553   ALBUMIN 3.4 04/04/2013 1451   AST 11 09/22/2023 1154   AST 26 04/04/2013 1451   ALT 14 09/22/2023 1154   ALT 34 04/04/2013 1451   ALKPHOS 90 09/22/2023 1154   ALKPHOS 88 04/04/2013 1451   BILITOT 0.4 09/22/2023 1154   BILITOT 0.2 03/04/2022 1553    BILITOT 0.5 04/04/2013 1451   GFR 50.53 (L) 09/22/2023 1154   EGFR 45 (L) 03/04/2022 1553   GFRNONAA >60 05/17/2022 1247   GFRNONAA >60 09/17/2014 0549   GFRNONAA >60 04/04/2013 1451     No results found.     Assessment & Plan:   1. Lymphedema (Primary) Recommend:  No surgery or intervention at this point in time.   The Patient is CEAP [id number].  The patient has been wearing compression for more than 12 weeks with no or little benefit.  The patient has been exercising daily for more than 12 weeks. The patient has been elevating and taking OTC pain medications for more than 12 weeks.  None of these have have eliminated the pain related to the lymphedema or the discomfort regarding excessive swelling and venous congestion.    I have reviewed my discussion with the patient regarding lymphedema and why it  causes symptoms.  Patient will continue wearing graduated compression on a daily basis. The patient should put the compression on first thing in the morning and removing them in the evening. The patient should not sleep in the compression.   In addition, behavioral modification throughout the day will be continued.  This will include frequent elevation (such as in a recliner), use of over the counter pain medications as needed and exercise such as walking.  The systemic causes for chronic edema such as liver, kidney and cardiac etiologies do not appear to have significant changed over the past year.    The patient has chronic , severe lymphedema with hyperpigmentation of the skin and has done MLD, skin care, medication, diet, exercise, elevation and compression for 4 weeks with no improvement,  I am recommending a lymphedema pump.  The patient still has stage 3 lymphedema and therefore, I believe that a lymph pump is needed to improve the control of the patient's lymphedema and improve the quality of life.  Additionally, a lymph pump is warranted because it will reduce the risk of  cellulitis and ulceration in the future.  Patient should follow-up in six months   2. Essential hypertension Continue antihypertensive medications as already ordered, these medications have been reviewed and there are no changes at this time.  3. Arthritis of both knees Continue medications to treat the patient's degenerative disease as already ordered, these medications have been reviewed and there are no changes at this time.  Continued activity and therapy was stressed.   Current Outpatient Medications on File Prior to Visit  Medication Sig Dispense Refill   ACCU-CHEK GUIDE test strip Use as instructed 100 strip 3   albuterol  (VENTOLIN  HFA) 108 (90 Base) MCG/ACT inhaler Inhale 1-2 puffs into the lungs every 6 (six) hours  as needed for wheezing or shortness of breath. 18 g 11   amLODipine  (NORVASC ) 5 MG tablet Take 1 tablet (5 mg total) by mouth daily. 90 tablet 3   benztropine  (COGENTIN ) 1 MG tablet Take 1 mg by mouth 2 (two) times daily.     Blood Glucose Monitoring Suppl (ACCU-CHEK GUIDE ME) w/Device KIT Use as directed,once daily to test blood sugar 1 kit 0   buPROPion (WELLBUTRIN XL) 150 MG 24 hr tablet Take 300 mg by mouth every morning.     busPIRone (BUSPAR) 15 MG tablet Take 15 mg by mouth 2 (two) times daily.     EPINEPHrine  0.3 mg/0.3 mL IJ SOAJ injection Inject 0.3 mg into the muscle as needed for anaphylaxis. 2 each 3   famotidine  (PEPCID ) 40 MG tablet Take 1 tablet (40 mg total) by mouth daily. 90 tablet 3   FLUoxetine  (PROZAC ) 40 MG capsule Take 40 mg by mouth daily.     fluPHENAZine  (PROLIXIN ) 5 MG tablet Take 5 mg by mouth 2 (two) times daily.     fluticasone  (FLONASE ) 50 MCG/ACT nasal spray Place 2 sprays into both nostrils daily. 48 g 3   hydrOXYzine  (ATARAX ) 50 MG tablet Take 50 mg by mouth 2 (two) times daily as needed for anxiety.     Insulin  Pen Needle (NOVOFINE) 30G X 8 MM MISC Use with victoza  daily 1.8 mg 90 each 3   metoprolol  succinate (TOPROL -XL) 25 MG 24  hr tablet Take 25 mg by mouth daily.     metroNIDAZOLE  (FLAGYL ) 500 MG tablet Take 1 tab BID for 7 days; NO alcohol  use for 10 days after prescription start 14 tablet 0   Multiple Vitamins-Minerals (ALIVE MULTI-VITAMIN PO) Take by mouth daily in the afternoon.     nicotine  (NICODERM CQ  - DOSED IN MG/24 HOURS) 21 mg/24hr patch Place 1 patch (21 mg total) onto the skin daily. For 6 weeks, then call office for 14mg /24 hr nicotine  patch. 28 patch 2   nicotine  polacrilex (NICORETTE ) 4 MG gum Take 1 each (4 mg total) by mouth as needed for smoking cessation. 20 tablet 1   omalizumab  (XOLAIR ) 150 MG/ML prefilled syringe      omeprazole  (PRILOSEC) 40 MG capsule Take 1 capsule (40 mg total) by mouth 2 times daily at 12 noon and 4 pm. 120 capsule 0   oxyCODONE -acetaminophen  (PERCOCET) 10-325 MG tablet Take 1 tablet by mouth 3 (three) times daily as needed for pain.     scopolamine  (TRANSDERM-SCOP) 1 MG/3DAYS Place 1 patch (1.5 mg total) onto the skin every 3 (three) days. 4 hours before you get on boat 4 patch 0   Semaglutide ,0.25 or 0.5MG /DOS, 2 MG/3ML SOPN Inject 0.5 mg into the skin once a week.     topiramate  (TOPAMAX ) 25 MG tablet Take 1 tablet (25 mg total) by mouth 2 (two) times daily. 90 tablet 3   vitamin B-12 (CYANOCOBALAMIN ) 1000 MCG tablet Take 1,000 mcg by mouth daily.     No current facility-administered medications on file prior to visit.    There are no Patient Instructions on file for this visit. No follow-ups on file.   Jennine Peddy E Radley Barto, NP

## 2023-10-12 ENCOUNTER — Encounter: Payer: 59 | Admitting: Physical Therapy

## 2023-10-14 ENCOUNTER — Encounter: Payer: 59 | Admitting: Physical Therapy

## 2023-10-14 ENCOUNTER — Encounter: Payer: Self-pay | Admitting: Family

## 2023-10-15 ENCOUNTER — Encounter: Payer: Self-pay | Admitting: Advanced Practice Midwife

## 2023-10-15 ENCOUNTER — Ambulatory Visit
Admission: RE | Admit: 2023-10-15 | Discharge: 2023-10-15 | Disposition: A | Discharge status: Home / Self Care | Payer: 59 | Source: Ambulatory Visit | Attending: Gastroenterology | Admitting: Gastroenterology

## 2023-10-15 ENCOUNTER — Ambulatory Visit: Payer: 59 | Admitting: Advanced Practice Midwife

## 2023-10-15 ENCOUNTER — Telehealth: Payer: Self-pay

## 2023-10-15 DIAGNOSIS — R1319 Other dysphagia: Secondary | ICD-10-CM | POA: Diagnosis not present

## 2023-10-15 DIAGNOSIS — F39 Unspecified mood [affective] disorder: Secondary | ICD-10-CM

## 2023-10-15 DIAGNOSIS — R1312 Dysphagia, oropharyngeal phase: Secondary | ICD-10-CM | POA: Diagnosis not present

## 2023-10-15 DIAGNOSIS — Z9889 Other specified postprocedural states: Secondary | ICD-10-CM | POA: Insufficient documentation

## 2023-10-15 DIAGNOSIS — Z720 Tobacco use: Secondary | ICD-10-CM

## 2023-10-15 DIAGNOSIS — Z113 Encounter for screening for infections with a predominantly sexual mode of transmission: Secondary | ICD-10-CM

## 2023-10-15 DIAGNOSIS — Z90710 Acquired absence of both cervix and uterus: Secondary | ICD-10-CM | POA: Insufficient documentation

## 2023-10-15 LAB — WET PREP FOR TRICH, YEAST, CLUE
Trichomonas Exam: NEGATIVE
Yeast Exam: NEGATIVE

## 2023-10-15 LAB — HM HIV SCREENING LAB: HM HIV Screening: NEGATIVE

## 2023-10-15 LAB — HEPATITIS B SURFACE ANTIGEN: Hepatitis B Surface Ag: NONREACTIVE

## 2023-10-15 LAB — HM HEPATITIS C SCREENING LAB: HM Hepatitis Screen: NEGATIVE

## 2023-10-15 NOTE — Therapy (Signed)
 Modified Barium Swallow Study  Patient Details  Name: Heather Sweeney MRN: [id number] Date of Birth: [birth year]  Today's Date: 10/15/2023  Modified Barium Swallow completed.  Full report located under Chart Review in the Imaging Section.  History of Present Illness 89 y.y. female who presents for MBSS. Hx of EGD in 2020 noting severe dysmotility. Esophagram, 09/25/23, Single contrast esophagram significant for mild lower esophageal dysmotility. Consider modified barium swallow for more detailed  assessment of the upper esophagus given patient's presenting complaints.  Pt with c/o coughing with POs. Pt with PMHx GERD, HLD, angioedema, HTN, pre-diabetes, and OSA on CPAP.   Clinical Impression Pt presents with a functional oropharyngeal swallow. Primary esophageal dysphagia suspected given pt's complaints, limited-no-clearance of barium as well as retrograde bolus flow in esophagus, and pt's complaints. Factors that may increase risk of adverse event in presence of aspiration Noe & Lianne 2021): Respiratory or GI disease  Swallow Evaluation Recommendations Recommendations: PO diet PO Diet Recommendation: Regular;Thin liquids (Level 0) (chopped, well-moistened solids) Liquid Administration via: Spoon;Cup;Straw Medication Administration: Crushed with puree Supervision: Patient able to self-feed Swallowing strategies  : Slow rate;Small bites/sips;Follow solids with liquids Postural changes: Position pt fully upright for meals (upright 60-90 minutes after meals) Oral care recommendations: Oral care BID (2x/day) Recommended consults: Consider GI consultation;Consider esophageal assessment     Delon Bangs, M.S., CCC-SLP Speech-Language Pathologist [facility] [phone] (ASCOM)  Delon HERO Kyoko Elsea 10/15/2023,1:55 PM

## 2023-10-15 NOTE — Progress Notes (Addendum)
 Pt is here for STD visit.  Wet prep results reviewed with pt, no treatment required per standing order.  Condoms given. Encouraged follow up with PCP, pt verbalized understanding. Orion Modest Joselynn Amoroso,RN

## 2023-10-15 NOTE — Telephone Encounter (Signed)
 Pt called triage reporting that [facility] and when the Dr went to do the exam she was having heavy bleeding, pt was told to follow up with us . Pt reports she has had a hysterectomy and has not had any bleeding in the past. Pt aware front desk will call and schedule. [phone]

## 2023-10-15 NOTE — Progress Notes (Signed)
 [facility] Department STI clinic [address] [place] [address] Main phone: [phone]  STI screening visit  Subjective:  Heather Sweeney is a 58 y.o.MBF vaper G2P2  female being seen today for an STI screening visit. The patient reports they do not have symptoms.  Patient reports that they do not desire a pregnancy in the next year.   They reported they are not interested in discussing contraception today.    No LMP recorded. Patient has had a hysterectomy.  Patient has the following medical conditions:   Patient Active Problem List   Diagnosis Date Noted   H/O abdominoplasty 04/2023 10/15/2023   H/O: hysterectomy 2012 10/15/2023   Enlarged clavicle 09/22/2023   Anxiety and depression 09/22/2023   Smoking history 11/05/2022   High serum vitamin B12 10/25/2022   Lymphedema 08/24/2022   Panniculitis 04/22/2022   Stage 3a chronic kidney disease (HCC) 03/16/2022   Chronic idiopathic urticaria 12/12/2021   Intertrigo 02/12/2021   Insomnia 10/11/2020   Arthritis of both knees 06/08/2020   Gastroesophageal reflux disease 04/20/2020   Mitral valve insufficiency 01/10/2020   Vitamin D  deficiency 02/01/2019   Mood disorder (HCC) 12/24/2018   HLD (hyperlipidemia) 09/01/2018   History of prediabetes 09/01/2018   Chronic pain 08/31/2018   Allergic rhinitis 08/31/2018   Lumbosacral radiculitis 02/19/2018   Angioedema 07/07/2017   Bilateral carpal tunnel syndrome 11/03/2016   Morbid obesity with BMI of 40.0-44.9, adult (HCC) 08/07/2016   Essential hypertension 07/10/2016   OSA on CPAP 07/10/2016    Chief Complaint  Patient presents with   SEXUALLY TRANSMITTED DISEASE    No symptoms, routine check    HPI  Patient reports asymptomatic. Pt states had hysterectomy 2012 and no uterus or cervix, as well as tummy tuck 04/2023.  LMP 2012 with hysterectomy. Last sex 08/2023 without condom; with current partner since she was 45 yo. Last vaped 2  wks ago. Last MJ age 23. Last ETOH 8 years ago (2 glasses liquor).   Does the patient using douching products? No  Last HIV test per patient/review of record was  Lab Results  Component Value Date   HMHIVSCREEN Negative - Validated 02/04/2016    Lab Results  Component Value Date   HIV Non Reactive 07/07/2017     Last HEPC test per patient/review of record was No results found for: HMHEPCSCREEN No components found for: HEPC   Last HEPB test per patient/review of record was No components found for: HMHEPBSCREEN No components found for: HEPC   Patient reports last pap was: 12/02/22 neg    Component Value Date/Time   DIAGPAP  12/02/2022 1047    - Negative for intraepithelial lesion or malignancy (NILM)   DIAGPAP  11/29/2019 1532    - Negative for intraepithelial lesion or malignancy (NILM)   HPVHIGH Negative 12/02/2022 1047   HPVHIGH Negative 11/29/2019 1532   ADEQPAP Satisfactory for evaluation. 12/02/2022 1047   ADEQPAP Satisfactory for evaluation. 11/29/2019 1532   No results found for: SPECADGYN Result Date Procedure Results Follow-ups  12/02/2022 Cytology - PAP High risk HPV: Negative Adequacy: Satisfactory for evaluation. Diagnosis: - Negative for intraepithelial lesion or malignancy (NILM) Comment: Normal Reference Range HPV - Negative   11/29/2019 Cytology - PAP High risk HPV: Negative Adequacy: Satisfactory for evaluation. Diagnosis: - Negative for intraepithelial lesion or malignancy (NILM) Comment: Normal Reference Range HPV - Negative   06/23/2017 HM PAP SMEAR HM Pap smear: normal   02/29/2016 HM PAP SMEAR  Screening for MPX risk: Does the patient have an unexplained rash? No Is the patient MSM? No Does the patient endorse multiple sex partners or anonymous sex partners? No Did the patient have close or sexual contact with a person diagnosed with MPX? No Has the patient traveled outside the [place]  where MPX is endemic? No Is there a high clinical  suspicion for MPX-- evidenced by one of the following No  -Unlikely to be chickenpox  -Lymphadenopathy  -Rash that present in same phase of evolution on any given body part See flowsheet for further details and programmatic requirements.   Immunization history:  Immunization History  Administered Date(s) Administered   Influenza,inj,Quad PF,6+ Mos 07/12/2016, 06/09/2019, 08/01/2021, 06/25/2022   Influenza-Unspecified 05/06/2020, 08/26/2023   PFIZER(Purple Top)SARS-COV-2 Vaccination 01/17/2020, 02/07/2020   Pneumococcal Conjugate-13 09/10/2021   Pneumococcal Polysaccharide-23 07/12/2016   Tdap 04/17/2009, 11/24/2021   Zoster Recombinant(Shingrix ) 08/01/2021, 11/24/2021     The following portions of the patient's history were reviewed and updated as appropriate: allergies, current medications, past medical history, past social history, past surgical history and problem list.  Objective:  There were no vitals filed for this visit.  Physical Exam Vitals and nursing note reviewed.  Constitutional:      Appearance: Normal appearance. She is obese.  HENT:     Head: Normocephalic and atraumatic.     Mouth/Throat:     Mouth: Mucous membranes are moist.     Pharynx: Oropharynx is clear. No oropharyngeal exudate or posterior oropharyngeal erythema.  Pulmonary:     Effort: Pulmonary effort is normal.  Abdominal:     General: Abdomen is flat.     Palpations: There is no mass.     Tenderness: There is no abdominal tenderness. There is no rebound.     Comments: Soft, poor tone, without masses or tenderness  Genitourinary:    General: Normal vulva.     Exam position: Lithotomy position.     Pubic Area: No rash or pubic lice.      Labia:        Right: No rash or lesion.        Left: No rash or lesion.      Vagina: Normal. No vaginal discharge, erythema, bleeding (small amt brown blood in vagina, ph<4.5) or lesions.     Cervix: No cervical motion tenderness, discharge, friability, lesion  or erythema.     Adnexa: Right adnexa normal and left adnexa normal.     Rectum: Normal.     Comments: pH = <4.5 Pt states had hysterectomy 2012 and has no uterus or cervix Pt declined chaperone Lymphadenopathy:     Head:     Right side of head: No preauricular or posterior auricular adenopathy.     Left side of head: No preauricular or posterior auricular adenopathy.     Cervical: No cervical adenopathy.     Right cervical: No superficial, deep or posterior cervical adenopathy.    Left cervical: No superficial, deep or posterior cervical adenopathy.     Upper Body:     Right upper body: No supraclavicular, axillary or epitrochlear adenopathy.     Left upper body: No supraclavicular, axillary or epitrochlear adenopathy.     Lower Body: No right inguinal adenopathy. No left inguinal adenopathy.  Skin:    General: Skin is warm and dry.     Findings: No rash.  Neurological:     Mental Status: She is alert and oriented to person, place, and time.    Assessment and  Plan:  Heather Sweeney is a 58 y.o. female presenting to the [facility] Department for STI screening  1. Screening examination for venereal disease (Primary) Treat wet mount per standing orders Immunization nurse consult Pt referred to her primary care MD for blood in vagina  - WET PREP FOR TRICH, YEAST, CLUE - Chlamydia/Gonorrhea [facility] - HIV/HCV [place] [facility] - Syphilis Serology, [place] - HBV Antigen/Antibody [facility]  2. H/O abdominoplasty 04/2023   3. H/O: hysterectomy 2012    Patient accepted all screenings including oral, vaginal CT/GC and bloodwork for HIV/RPR, and wet prep. Patient meets criteria for HepB screening? Yes. Ordered? yes Patient meets criteria for HepC screening? Yes. Ordered? yes  Treat wet prep per standing order Discussed time line for State Lab results and that patient will be called with positive results and encouraged patient to call if she had not heard in 2  weeks.  Counseled to return or seek care for continued or worsening symptoms Recommended repeat testing in 3 months with positive results. Recommended condom use with all sex for STI prevention.   Patient is currently using  hysterectomy  to prevent pregnancy.    No follow-ups on file.  Future Appointments  Date Time Provider Department Center  10/15/2023  1:00 PM [facility]  11/03/2023  1:15 PM Therisa Bi, MD [facility] None  12/21/2023 11:00 AM Dineen Rollene MATSU, FNP [facility]  04/05/2024 10:45 AM Dillingham, Estefana RAMAN, DO [facility] None  09/27/2024 11:30 AM [facility] ANNUAL WELLNESS VISIT [facility]  10/10/2024 11:30 AM Schnier, Cordella MATSU, MD [facility] None    Almarie DELENA Hillier, CNM

## 2023-10-19 ENCOUNTER — Other Ambulatory Visit (HOSPITAL_COMMUNITY)
Admission: RE | Admit: 2023-10-19 | Discharge: 2023-10-19 | Disposition: A | Discharge status: Home / Self Care | Payer: 59 | Source: Ambulatory Visit | Attending: Certified Nurse Midwife | Admitting: Certified Nurse Midwife

## 2023-10-19 ENCOUNTER — Encounter: Payer: Self-pay | Admitting: Certified Nurse Midwife

## 2023-10-19 ENCOUNTER — Ambulatory Visit (INDEPENDENT_AMBULATORY_CARE_PROVIDER_SITE_OTHER): Payer: 59 | Admitting: Certified Nurse Midwife

## 2023-10-19 VITALS — BP 135/81 | HR 101 | Wt 237.8 lb

## 2023-10-19 DIAGNOSIS — M545 Low back pain, unspecified: Secondary | ICD-10-CM

## 2023-10-19 DIAGNOSIS — N939 Abnormal uterine and vaginal bleeding, unspecified: Secondary | ICD-10-CM | POA: Insufficient documentation

## 2023-10-19 LAB — POCT URINALYSIS DIPSTICK
Bilirubin, UA: NEGATIVE
Blood, UA: NEGATIVE
Glucose, UA: NEGATIVE
Ketones, UA: NEGATIVE
Leukocytes, UA: NEGATIVE
Nitrite, UA: NEGATIVE
Protein, UA: NEGATIVE
Spec Grav, UA: 1.01 (ref 1.010–1.025)
Urobilinogen, UA: 0.2 U/dL
pH, UA: 6 (ref 5.0–8.0)

## 2023-10-19 NOTE — Progress Notes (Signed)
 GYN ENCOUNTER NOTE  Subjective:       Heather Sweeney is a 58 y.o. G45P2002 female is here for gynecologic evaluation of the following issues:  1. Vaginal bleeding, pt state she was seen at the health department and had speculum exam for Std/screening and starting having bleeding afterwards. She notes that is is red/brown with wiping. .    2. Back pain - mid back , denies urinary urgency, burning , and or frequency.   Gynecologic History No LMP recorded. Patient has had a hysterectomy. Contraception: status post hysterectomy, no cervix Last Pap: hysterectomy .  Last mammogram: 12/19/2022.   Obstetric History OB History  Gravida Para Term Preterm AB Living  2 2 2   2   SAB IAB Ectopic Multiple Live Births          # Outcome Date GA Lbr Len/2nd Weight Sex Type Anes PTL Lv  2 Term 04/16/09 [redacted]w[redacted]d  7 lb 12.8 oz (3.538 kg) M CS-Unspec     1 Term 09/05/91 [redacted]w[redacted]d  7 lb 14.4 oz (3.583 kg) M CS-Unspec       Past Medical History:  Diagnosis Date   Abnormal MRI, lumbar spine 02/12/2021   Abrasion of anterior left lower leg 07/10/2022   Allergic rhinitis    Annual physical exam 04/20/2020   Anxiety    Aperistalsis, esophagus 10/25/2020   Arthritis of knee, right    Arthritis of right knee 10/25/2020   Asthma    Bacterial vaginosis 10/20/2019   Bipolar disorder (HCC)    BMI 37.0-37.9, adult 03/07/2022   BMI 40.0-44.9, adult (HCC) 06/17/2022   Bronchiectasis without complication (HCC) 04/04/2022   Chronic low back pain    Chronic midline low back pain 12/17/2021   Class 3 severe obesity due to excess calories in adult (HCC) 04/09/2022   CTS (carpal tunnel syndrome)    hands   Depression    Early satiety 02/11/2022   Elevated liver enzymes 08/17/2020   Encounter for screening colonoscopy    Encounter for screening for COVID-19 10/10/2022   Frequent PVCs 09/24/2016   Gallstones    GERD (gastroesophageal reflux disease)    History of prediabetes    Hives 02/23/2019   Hypertension     Hypokalemia 12/13/2021   Incontinence of feces 02/12/2021   Ingrown right big toenail 04/20/2020   Iron deficiency anemia 12/13/2021   Laceration of right foot    Lymphedema 08/24/2022   Obesity (BMI 30-39.9) 09/10/2021   Onychomycosis    OSA on CPAP    does not use cpap   Osteoarthritis of knee 12/17/2021   Overactive bladder    Pain and swelling of right knee 04/20/2020   Pre-diabetes    diet controlled   PVC's (premature ventricular contractions)    PVC's (premature ventricular contractions)    Dr. MARLA cards   S/P laparoscopic cholecystectomy April 2019 01/12/2018   Seizures (HCC)    8th grade   Tachycardia 10/10/2022   Urticaria 02/23/2019   Weight loss 02/11/2022    Past Surgical History:  Procedure Laterality Date   ABDOMINAL HYSTERECTOMY  07/08/2011   CARPAL TUNNEL RELEASE     right hand 1996   CESAREAN SECTION     2012   CHOLECYSTECTOMY N/A 01/12/2018   Procedure: LAPAROSCOPIC CHOLECYSTECTOMY WITH INTRAOPERATIVE CHOLANGIOGRAM ERAS PATHWAY;  Surgeon: Gladis Cough, MD;  Location: [facility]S;  Service: General;  Laterality: N/A;   COLONOSCOPY WITH PROPOFOL  N/A 04/15/2019   Procedure: COLONOSCOPY WITH PROPOFOL ;  Surgeon: Therisa Bi, MD;  Location: [facility] ENDOSCOPY;  Service: Gastroenterology;  Laterality: N/A;   DILATION AND CURETTAGE OF UTERUS     04/2011 benign endometrial polyp squamous metaplasia, inflamm, blood, mucous, benign    ESOPHAGOGASTRODUODENOSCOPY (EGD) WITH PROPOFOL  N/A 10/23/2020   Procedure: ESOPHAGOGASTRODUODENOSCOPY (EGD) WITH PROPOFOL ;  Surgeon: Therisa Bi, MD;  Location: [facility] ENDOSCOPY;  Service: Gastroenterology;  Laterality: N/A;   FLEXIBLE SIGMOIDOSCOPY N/A 02/15/2021   Procedure: FLEXIBLE SIGMOIDOSCOPY;  Surgeon: Therisa Bi, MD;  Location[facility]OSCOPY;  Service: Gastroenterology;  Laterality: N/A;   FOOT SURGERY     Dr. Loreda    LEEP     2006   PANNICULECTOMY N/A 04/24/2022   Procedure: INFRAUMBILICAL PANNICULECTOMY;  Surgeon:  Lowery Estefana RAMAN, DO;  Location: [facility] OR;  Service: Plastics;  Laterality: N/A;   Right hand carpal tunnel release Right 10/2022   scar tissue repair  04/2023   scar tissue from having children and tummy tuck   TUBAL LIGATION      Current Outpatient Medications on File Prior to Visit  Medication Sig Dispense Refill   ACCU-CHEK GUIDE test strip Use as instructed 100 strip 3   albuterol  (VENTOLIN  HFA) 108 (90 Base) MCG/ACT inhaler Inhale 1-2 puffs into the lungs every 6 (six) hours as needed for wheezing or shortness of breath. 18 g 11   amLODipine  (NORVASC ) 5 MG tablet Take 1 tablet (5 mg total) by mouth daily. 90 tablet 3   benztropine  (COGENTIN ) 1 MG tablet Take 1 mg by mouth 2 (two) times daily.     Blood Glucose Monitoring Suppl (ACCU-CHEK GUIDE ME) w/Device KIT Use as directed,once daily to test blood sugar 1 kit 0   buPROPion (WELLBUTRIN XL) 150 MG 24 hr tablet Take 300 mg by mouth every morning.     busPIRone (BUSPAR) 15 MG tablet Take 15 mg by mouth 2 (two) times daily.     EPINEPHrine  0.3 mg/0.3 mL IJ SOAJ injection Inject 0.3 mg into the muscle as needed for anaphylaxis. 2 each 3   famotidine  (PEPCID ) 40 MG tablet Take 1 tablet (40 mg total) by mouth daily. 90 tablet 3   FLUoxetine  (PROZAC ) 40 MG capsule Take 40 mg by mouth daily.     fluPHENAZine  (PROLIXIN ) 5 MG tablet Take 5 mg by mouth 2 (two) times daily.     fluticasone  (FLONASE ) 50 MCG/ACT nasal spray Place 2 sprays into both nostrils daily. 48 g 3   hydrOXYzine  (ATARAX ) 50 MG tablet Take 50 mg by mouth 2 (two) times daily as needed for anxiety.     Insulin  Pen Needle (NOVOFINE) 30G X 8 MM MISC Use with victoza  daily 1.8 mg 90 each 3   metoprolol  succinate (TOPROL -XL) 25 MG 24 hr tablet Take 25 mg by mouth daily.     metroNIDAZOLE  (FLAGYL ) 500 MG tablet Take 1 tab BID for 7 days; NO alcohol  use for 10 days after prescription start 14 tablet 0   Multiple Vitamins-Minerals (ALIVE MULTI-VITAMIN PO) Take by mouth daily in the  afternoon.     nicotine  (NICODERM CQ  - DOSED IN MG/24 HOURS) 21 mg/24hr patch Place 1 patch (21 mg total) onto the skin daily. For 6 weeks, then call office for 14mg /24 hr nicotine  patch. 28 patch 2   nicotine  polacrilex (NICORETTE ) 4 MG gum Take 1 each (4 mg total) by mouth as needed for smoking cessation. 20 tablet 1   omalizumab  (XOLAIR ) 150 MG/ML prefilled syringe      omeprazole  (PRILOSEC) 40 MG capsule Take 1 capsule (40 mg total)  by mouth 2 times daily at 12 noon and 4 pm. 120 capsule 0   oxyCODONE -acetaminophen  (PERCOCET) 10-325 MG tablet Take 1 tablet by mouth 3 (three) times daily as needed for pain.     scopolamine  (TRANSDERM-SCOP) 1 MG/3DAYS Place 1 patch (1.5 mg total) onto the skin every 3 (three) days. 4 hours before you get on boat 4 patch 0   Semaglutide ,0.25 or 0.5MG /DOS, 2 MG/3ML SOPN Inject 0.5 mg into the skin once a week.     topiramate  (TOPAMAX ) 25 MG tablet Take 1 tablet (25 mg total) by mouth 2 (two) times daily. 90 tablet 3   vitamin B-12 (CYANOCOBALAMIN ) 1000 MCG tablet Take 1,000 mcg by mouth daily.     No current facility-administered medications on file prior to visit.    Allergies  Allergen Reactions   Cozaar  [Losartan ] Hives and Swelling    Angioedema   Keflex [Cephalexin] Swelling and Rash   Penicillins Rash    Has patient had a PCN reaction causing immediate rash, facial/tongue/throat swelling, SOB or lightheadedness with hypotension: Yes Has patient had a PCN reaction causing severe rash involving mucus membranes or skin necrosis: No Has patient had a PCN reaction that required hospitalization: No Has patient had a PCN reaction occurring within the last 10 years: No If all of the above answers are NO, then may proceed with Cephalosporin use.    Zestril [Lisinopril] Hives and Other (See Comments)    Headache Low BP Angioedema     Hctz [Hydrochlorothiazide ] Swelling and Rash   Bee Venom Swelling   Metformin  And Related Rash    Social History    Socioeconomic History   Marital status: Married    Spouse name: Not on file   Number of children: Not on file   Years of education: Not on file   Highest education level: Not on file  Occupational History   Not on file  Tobacco Use   Smoking status: Some Days    Types: E-cigarettes   Smokeless tobacco: Current    Types: Chew   Tobacco comments:    She uses dip daily. She hasn't smoke cigarettes.  Vaping Use   Vaping status: Never Used  Substance and Sexual Activity   Alcohol  use: Not Currently    Comment: last use 8 years ago   Drug use: Yes    Types: Marijuana    Comment: last use age 62   Sexual activity: Not Currently    Partners: Male    Birth control/protection: Surgical    Comment: Hysterectomy 2012  Other Topics Concern   Not on file  Social History Narrative   Married    No guns    Wears seat belt    Safe in relationship    Never smoker    2 sons Dorise ~10 and another son ~28 as of 10/2019   1 granson eli 1 y.o as of 10/2019   2 sisters    Unemployed       Social Drivers of Corporate Investment Banker Strain: Low Risk  (09/22/2023)   Overall Financial Resource Strain (CARDIA)    Difficulty of Paying Living Expenses: Not hard at all  Food Insecurity: No Food Insecurity (09/22/2023)   Hunger Vital Sign    Worried About Running Out of Food in the Last Year: Never true    Ran Out of Food in the Last Year: Never true  Transportation Needs: No Transportation Needs (09/22/2023)   PRAPARE - Transportation    Lack  of Transportation (Medical): No    Lack of Transportation (Non-Medical): No  Physical Activity: Inactive (09/22/2023)   Exercise Vital Sign    Days of Exercise per Week: 0 days    Minutes of Exercise per Session: 0 min  Stress: Stress Concern Present (09/22/2023)   [employer] of Occupational Health - Occupational Stress Questionnaire    Feeling of Stress : To some extent  Social Connections: Socially Integrated (09/22/2023)   Social  Connection and Isolation Panel [NHANES]    Frequency of Communication with Friends and Family: More than three times a week    Frequency of Social Gatherings with Friends and Family: More than three times a week    Attends Religious Services: More than 4 times per year    Active Member of [employer] or Organizations: Yes    Attends Engineer, Structural: More than 4 times per year    Marital Status: Married  Catering Manager Violence: Not At Risk (09/22/2023)   Humiliation, Afraid, Rape, and Kick questionnaire    Fear of Current or Ex-Partner: No    Emotionally Abused: No    Physically Abused: No    Sexually Abused: No    Family History  Problem Relation Age of Onset   Heart failure Mother    Hypertension Mother    Early death Mother 29   Heart disease Mother 99       chf   Cancer Father 15       esophageal    Hypertension Father    Breast cancer Sister 77   Mental illness Sister    Heart failure Sister    Asthma Son    Cancer Paternal Aunt        brain cancer   Thyroid  cancer Neg Hx     The following portions of the patient's history were reviewed and updated as appropriate: allergies, current medications, past family history, past medical history, past social history, past surgical history and problem list.  Review of Systems Review of Systems - Negative except as mentioned in HPI Review of Systems - General ROS: negative for - chills, fatigue, fever, hot flashes, malaise or night sweats Hematological and Lymphatic ROS: negative for - bleeding problems or swollen lymph nodes Gastrointestinal ROS: negative for - abdominal pain, blood in stools, change in bowel habits and nausea/vomiting Musculoskeletal ROS: negative for - joint pain, muscle pain or muscular weakness Genito-Urinary ROS: negative for - change in menstrual cycle, dysmenorrhea, dyspareunia, dysuria, genital discharge, genital ulcers, hematuria, incontinence, nocturia or pelvic pain. Positive for vaginal  bleeding.   Objective:   BP 135/81   Pulse (!) 101   Wt 237 lb 12.8 oz (107.9 kg)   BMI 43.49 kg/m  CONSTITUTIONAL: Well-developed, well-nourished female in no acute distress.  HENT:  Normocephalic, atraumatic.  NECK: Normal range of motion, supple, no masses.  Normal thyroid .  SKIN: Skin is warm and dry. No rash noted. Not diaphoretic. No erythema. No pallor. NEUROLGIC: Alert and oriented to person, place, and time. PSYCHIATRIC: Normal mood and affect. Normal behavior. Normal judgment and thought content. CVA tenderness absent. Pt point to spine where pain is located CARDIOVASCULAR:Not Examined RESPIRATORY: Not Examined BREASTS: Not Examined ABDOMEN: Soft, non distended; Non tender.  No Organomegaly. PELVIC:  External Genitalia: Normal  BUS: Normal  Vagina: Normal, no blood noted   Cervix: absent, vaginal cuff intact, no polyps, swab collected.    MUSCULOSKELETAL: Normal range of motion. No tenderness.  No cyanosis, clubbing, or edema.  Assessment:    Vaginal bleeding Menopausal    Plan:   Swab collected to evaluate for infection. Pt had wet prep on 1/9 negative . Cervicoaxillary swab collected to evaluate for yeast, bv, and chlamydia , gonorrhea , and trich.  Discussed likely cause of bleeding from speculum exam done a health department . Discussed menopausal changes of vaginal tissue. She verbalizes understanding. Will follow up with results of swab.   Zelda Hummer, CNM

## 2023-10-20 DIAGNOSIS — G894 Chronic pain syndrome: Secondary | ICD-10-CM | POA: Diagnosis not present

## 2023-10-20 DIAGNOSIS — Z79891 Long term (current) use of opiate analgesic: Secondary | ICD-10-CM | POA: Diagnosis not present

## 2023-10-20 DIAGNOSIS — M25569 Pain in unspecified knee: Secondary | ICD-10-CM | POA: Diagnosis not present

## 2023-10-20 DIAGNOSIS — M47816 Spondylosis without myelopathy or radiculopathy, lumbar region: Secondary | ICD-10-CM | POA: Diagnosis not present

## 2023-10-20 DIAGNOSIS — Z5181 Encounter for therapeutic drug level monitoring: Secondary | ICD-10-CM | POA: Diagnosis not present

## 2023-10-20 DIAGNOSIS — M797 Fibromyalgia: Secondary | ICD-10-CM | POA: Diagnosis not present

## 2023-10-20 DIAGNOSIS — M5451 Vertebrogenic low back pain: Secondary | ICD-10-CM | POA: Diagnosis not present

## 2023-10-20 LAB — GONOCOCCUS CULTURE

## 2023-10-22 ENCOUNTER — Other Ambulatory Visit: Payer: Self-pay | Admitting: Certified Nurse Midwife

## 2023-10-22 ENCOUNTER — Encounter: Payer: Self-pay | Admitting: Family

## 2023-10-22 ENCOUNTER — Encounter: Payer: Self-pay | Admitting: Certified Nurse Midwife

## 2023-10-22 LAB — CERVICOVAGINAL ANCILLARY ONLY
Bacterial Vaginitis (gardnerella): NEGATIVE
Candida Glabrata: NEGATIVE
Candida Vaginitis: NEGATIVE
Chlamydia: NEGATIVE
Comment: NEGATIVE
Comment: NEGATIVE
Comment: NEGATIVE
Comment: NEGATIVE
Comment: NEGATIVE
Comment: NORMAL
Neisseria Gonorrhea: NEGATIVE
Trichomonas: POSITIVE — AB

## 2023-10-22 MED ORDER — METRONIDAZOLE 500 MG PO TABS: 500.0000 mg | ORAL_TABLET | Freq: Two times a day (BID) | ORAL | 0 refills | Status: AC

## 2023-10-22 NOTE — Telephone Encounter (Signed)
Spoke to pt and she has been scheduled for in person visit on 10/27/23

## 2023-10-23 ENCOUNTER — Encounter: Payer: Self-pay | Admitting: Family

## 2023-10-23 ENCOUNTER — Telehealth: Payer: Self-pay | Admitting: Family

## 2023-10-23 ENCOUNTER — Ambulatory Visit (INDEPENDENT_AMBULATORY_CARE_PROVIDER_SITE_OTHER): Payer: 59 | Admitting: Family

## 2023-10-23 VITALS — BP 138/88 | HR 79 | Temp 98.2°F | Ht 63.0 in | Wt 236.2 lb

## 2023-10-23 DIAGNOSIS — R899 Unspecified abnormal finding in specimens from other organs, systems and tissues: Secondary | ICD-10-CM | POA: Diagnosis not present

## 2023-10-23 DIAGNOSIS — R799 Abnormal finding of blood chemistry, unspecified: Secondary | ICD-10-CM

## 2023-10-23 DIAGNOSIS — R319 Hematuria, unspecified: Secondary | ICD-10-CM

## 2023-10-23 DIAGNOSIS — M5417 Radiculopathy, lumbosacral region: Secondary | ICD-10-CM | POA: Diagnosis not present

## 2023-10-23 DIAGNOSIS — N939 Abnormal uterine and vaginal bleeding, unspecified: Secondary | ICD-10-CM

## 2023-10-23 LAB — URINALYSIS, ROUTINE W REFLEX MICROSCOPIC
Bilirubin Urine: NEGATIVE
Hgb urine dipstick: NEGATIVE
Ketones, ur: NEGATIVE
Leukocytes,Ua: NEGATIVE
Nitrite: NEGATIVE
RBC / HPF: NONE SEEN (ref 0–?)
Specific Gravity, Urine: 1.015 (ref 1.000–1.030)
Total Protein, Urine: NEGATIVE
Urine Glucose: NEGATIVE
Urobilinogen, UA: 0.2 (ref 0.0–1.0)
pH: 7.5 (ref 5.0–8.0)

## 2023-10-23 LAB — POCT URINALYSIS DIPSTICK
Bilirubin, UA: NEGATIVE
Blood, UA: NEGATIVE
Glucose, UA: NEGATIVE
Ketones, UA: NEGATIVE
Nitrite, UA: NEGATIVE
Protein, UA: NEGATIVE
Spec Grav, UA: 1.02 (ref 1.010–1.025)
Urobilinogen, UA: 0.2 U/dL
pH, UA: 7.5 (ref 5.0–8.0)

## 2023-10-23 LAB — IBC + FERRITIN
Ferritin: 207.6 ng/mL (ref 10.0–291.0)
Iron: 50 ug/dL (ref 42–145)
Saturation Ratios: 15.5 % — ABNORMAL LOW (ref 20.0–50.0)
TIBC: 322 ug/dL (ref 250.0–450.0)
Transferrin: 230 mg/dL (ref 212.0–360.0)

## 2023-10-23 LAB — BASIC METABOLIC PANEL
BUN: 6 mg/dL (ref 6–23)
CO2: 28 meq/L (ref 19–32)
Calcium: 8.9 mg/dL (ref 8.4–10.5)
Chloride: 106 meq/L (ref 96–112)
Creatinine, Ser: 0.95 mg/dL (ref 0.40–1.20)
GFR: 67.51 mL/min (ref >60.00)
Glucose, Bld: 75 mg/dL (ref 70–99)
Potassium: 4.3 meq/L (ref 3.5–5.1)
Sodium: 141 meq/L (ref 135–145)

## 2023-10-23 LAB — B12 AND FOLATE PANEL
Folate: 5.5 ng/mL — ABNORMAL LOW (ref >5.9)
Vitamin B-12: 445 pg/mL (ref 211–911)

## 2023-10-23 NOTE — Telephone Encounter (Signed)
Following with [facility], Dr Annette Stable, phone [phone]  We need notes from him

## 2023-10-23 NOTE — Telephone Encounter (Signed)
Pt is on schedule today 10/22/22

## 2023-10-23 NOTE — Patient Instructions (Signed)
I reordered your MRI lumbar spine .  I will share this with Dr Annette Stable.   I did not see any bleeding on your vaginal rectal exam today.  We will look at labs.  Please let me know if symptom persists

## 2023-10-23 NOTE — Progress Notes (Signed)
Assessment & Plan:  Abnormal laboratory test -     Hepatitis B surface antibody,quantitative -     Basic metabolic panel -     Urinalysis, Routine w reflex microscopic -     B12 and Folate Panel -     IBC + Ferritin  Hematuria, unspecified type -     POCT urinalysis dipstick -     Urine Culture  Lumbosacral radiculitis Assessment & Plan: Concern for  progression.  Pending MRI lumbar spine.  Requesting notes Dr Annette Stable, pain management.  Patient currently wearing buprenorphine 10 mcg patch today, prescribed by Dr Annette Stable, added to medication list.  She is no longer on Percocet.   Orders: -     MR LUMBAR SPINE WO CONTRAST; Future  Vaginal bleeding Assessment & Plan: Resolved at this time.  Advised to complete metronidazole for treatment of trichomonas. advised to let me know if vaginal bleeding were to recur.  I suspect related to initial pelvic exam.  Deferred pelvic exam today in the absence of bleeding.   Other orders -     UNLABELED -     PAT ID TIQ DOC     Return precautions given.   Risks, benefits, and alternatives of the medications and treatment plan prescribed today were discussed, and patient expressed understanding.   Education regarding symptom management and diagnosis given to patient on AVS either electronically or printed.  No follow-ups on file.  Rennie Plowman, FNP  Subjective:    Patient ID: Heather Sweeney, female    DOB: 11-11-1965, 58 y.o.   MRN: [id number]  CC: Heather Sweeney is a 58 y.o. female who presents today for an acute visit.    HPI: Here today for follow up regarding vaginal bleeding  She describes bright pink blood from the vagina which started 2 weeks ago, episodic.   Last occurred  2 days ago. Onset after pelvic exam with [facility] 10/15/23  She had a subsequent visit with GYN 4 days after initial pelvic exam.     Compliant metronizade as started 10/29/23   No hemorrhoids.   No h/o kidney stones.   Urinalysis 3  weeks ago negative RBCs Hemoglobin 11.7 Diagnosed trichomonas 4 days ago; neg gonorrhea, BV Started on metronidazole  H/o hysterectomy  10/19/23 seen by GYN 4 days ago States she was seen at the [facility] department for screening examination 10/15/23  , had a pelvic exam with speculum and had bleeding afterward.  Red and brown with wiping.Discussed cause of bleeding likely from speculum exam.    She also complains of low back pain, bilaterally, for years.  She has intermittent right lateral leg numbness.  Worse with walking, long periods of standing.  She follows with pain managemnet. No longer on percocet.  No uinrary incontinuence, saddle anesthesia.  She follows with pain management and has recently been prescribed buprenorphine.      Allergies: Cozaar [losartan], Keflex [cephalexin], Penicillins, Zestril [lisinopril], Hctz [hydrochlorothiazide], Bee venom, and Metformin and related Current Outpatient Medications on File Prior to Visit  Medication Sig Dispense Refill   ACCU-CHEK GUIDE test strip Use as instructed 100 strip 3   albuterol (VENTOLIN HFA) 108 (90 Base) MCG/ACT inhaler Inhale 1-2 puffs into the lungs every 6 (six) hours as needed for wheezing or shortness of breath. 18 g 11   amLODipine (NORVASC) 5 MG tablet Take 1 tablet (5 mg total) by mouth daily. 90 tablet 3   benztropine (COGENTIN) 1 MG tablet Take 1 mg by  mouth 2 (two) times daily.     Blood Glucose Monitoring Suppl (ACCU-CHEK GUIDE ME) w/Device KIT Use as directed,once daily to test blood sugar 1 kit 0   buprenorphine (BUTRANS) 10 MCG/HR PTWK Place 10 patches onto the skin once a week.     buPROPion (WELLBUTRIN XL) 150 MG 24 hr tablet Take 300 mg by mouth every morning.     busPIRone (BUSPAR) 15 MG tablet Take 15 mg by mouth 2 (two) times daily.     EPINEPHrine 0.3 mg/0.3 mL IJ SOAJ injection Inject 0.3 mg into the muscle as needed for anaphylaxis. 2 each 3   famotidine (PEPCID) 40 MG tablet Take 1 tablet (40 mg  total) by mouth daily. 90 tablet 3   FLUoxetine (PROZAC) 40 MG capsule Take 40 mg by mouth daily.     fluPHENAZine (PROLIXIN) 5 MG tablet Take 5 mg by mouth 2 (two) times daily.     fluticasone (FLONASE) 50 MCG/ACT nasal spray Place 2 sprays into both nostrils daily. 48 g 3   hydrOXYzine (ATARAX) 50 MG tablet Take 50 mg by mouth 2 (two) times daily as needed for anxiety.     Insulin Pen Needle (NOVOFINE) 30G X 8 MM MISC Use with victoza daily 1.8 mg 90 each 3   metoprolol succinate (TOPROL-XL) 25 MG 24 hr tablet Take 25 mg by mouth daily.     metroNIDAZOLE (FLAGYL) 500 MG tablet Take 1 tab BID for 7 days; NO alcohol use for 10 days after prescription start 14 tablet 0   Multiple Vitamins-Minerals (ALIVE MULTI-VITAMIN PO) Take by mouth daily in the afternoon.     nicotine (NICODERM CQ - DOSED IN MG/24 HOURS) 21 mg/24hr patch Place 1 patch (21 mg total) onto the skin daily. For 6 weeks, then call office for 14mg /24 hr nicotine patch. 28 patch 2   nicotine polacrilex (NICORETTE) 4 MG gum Take 1 each (4 mg total) by mouth as needed for smoking cessation. 20 tablet 1   omalizumab (XOLAIR) 150 MG/ML prefilled syringe      omeprazole (PRILOSEC) 40 MG capsule Take 1 capsule (40 mg total) by mouth 2 times daily at 12 noon and 4 pm. 120 capsule 0   scopolamine (TRANSDERM-SCOP) 1 MG/3DAYS Place 1 patch (1.5 mg total) onto the skin every 3 (three) days. 4 hours before you get on boat 4 patch 0   Semaglutide,0.25 or 0.5MG /DOS, 2 MG/3ML SOPN Inject 0.5 mg into the skin once a week.     topiramate (TOPAMAX) 25 MG tablet Take 1 tablet (25 mg total) by mouth 2 (two) times daily. 90 tablet 3   vitamin B-12 (CYANOCOBALAMIN) 1000 MCG tablet Take 1,000 mcg by mouth daily.     No current facility-administered medications on file prior to visit.    Review of Systems  Constitutional:  Negative for chills and fever.  Respiratory:  Negative for cough.   Cardiovascular:  Negative for chest pain and palpitations.   Gastrointestinal:  Negative for nausea and vomiting.  Genitourinary:  Negative for menstrual problem, pelvic pain and vaginal bleeding (resolved).      Objective:    BP 138/88   Pulse 79   Temp 98.2 F (36.8 C) (Oral)   Ht 5\' 3"  (1.6 m)   Wt 236 lb 3.2 oz (107.1 kg)   SpO2 99%   BMI 41.84 kg/m   BP Readings from Last 3 Encounters:  10/23/23 138/88  10/19/23 135/81  10/08/23 136/80   Wt Readings from Last 3 Encounters:  10/23/23 236 lb 3.2 oz (107.1 kg)  10/19/23 237 lb 12.8 oz (107.9 kg)  10/08/23 235 lb (106.6 kg)    Physical Exam Vitals reviewed.  Constitutional:      Appearance: She is well-developed.  Eyes:     Conjunctiva/sclera: Conjunctivae normal.  Cardiovascular:     Rate and Rhythm: Normal rate and regular rhythm.     Pulses: Normal pulses.     Heart sounds: Normal heart sounds.  Pulmonary:     Effort: Pulmonary effort is normal.     Breath sounds: Normal breath sounds. No wheezing, rhonchi or rales.  Musculoskeletal:     Lumbar back: No swelling, edema, spasms, tenderness or bony tenderness. Normal range of motion.     Comments: Full range of motion with flexion, tension, lateral side bends. No bony tenderness. No pain, numbness, tingling elicited with single leg raise bilaterally.   Skin:    General: Skin is warm and dry.  Neurological:     Mental Status: She is alert.     Sensory: No sensory deficit.     Deep Tendon Reflexes:     Reflex Scores:      Patellar reflexes are 2+ on the right side and 2+ on the left side.    Comments: Sensation and strength intact bilateral lower extremities.  Psychiatric:        Speech: Speech normal.        Behavior: Behavior normal.        Thought Content: Thought content normal.

## 2023-10-23 NOTE — Assessment & Plan Note (Addendum)
Concern for sudden progression.  Pending MRI lumbar spine.  Requesting notes Dr Annette Stable, pain management.  Patient currently wearing buprenorphine 10 mcg patch today, prescribed by Dr Annette Stable, added to medication list.  She is no longer on Percocet.

## 2023-10-23 NOTE — Telephone Encounter (Signed)
Called [facility], Dr Annette Stable, phone [phone]  to get ov notes for pt but office was closed already, will try back at later date

## 2023-10-24 LAB — HEPATITIS B SURFACE ANTIBODY, QUANTITATIVE: Hep B S AB Quant (Post): <5 m[IU]/mL — ABNORMAL LOW (ref >=10)

## 2023-10-24 LAB — UNLABELED: Test Ordered On Req: 395

## 2023-10-26 ENCOUNTER — Telehealth: Payer: Self-pay

## 2023-10-26 DIAGNOSIS — M170 Bilateral primary osteoarthritis of knee: Secondary | ICD-10-CM | POA: Diagnosis not present

## 2023-10-26 DIAGNOSIS — M25562 Pain in left knee: Secondary | ICD-10-CM | POA: Diagnosis not present

## 2023-10-26 DIAGNOSIS — M25561 Pain in right knee: Secondary | ICD-10-CM | POA: Diagnosis not present

## 2023-10-26 NOTE — Telephone Encounter (Signed)
Left a detailed message to fax the office visit notes to our clinic.

## 2023-10-26 NOTE — Telephone Encounter (Signed)
Please call again-per below

## 2023-10-26 NOTE — Telephone Encounter (Signed)
Discrepancy form received from [facility]. I have completed it and faxed it back.

## 2023-10-26 NOTE — Telephone Encounter (Signed)
Copied from CRM [id number]. Topic: Clinical - Request for Lab/Test Order >> Oct 26, 2023  1:14 PM Sonny Dandy B wrote: Reason for CRM: [facility] called regarding a lab order, States the speciman no name on it. The order has the name, the samples does not. Requesting a call back. [phone]. Ref# [id number]

## 2023-10-26 NOTE — Telephone Encounter (Signed)
I guess it is the urine but I do not know what to do for that because Toniann Fail said before that is a specimen does not have a label it is no good.

## 2023-10-27 ENCOUNTER — Other Ambulatory Visit: Payer: Self-pay

## 2023-10-27 ENCOUNTER — Telehealth: Payer: Self-pay

## 2023-10-27 ENCOUNTER — Ambulatory Visit: Payer: Self-pay | Admitting: Family

## 2023-10-27 ENCOUNTER — Ambulatory Visit: Payer: 59 | Admitting: Family

## 2023-10-27 DIAGNOSIS — R1110 Vomiting, unspecified: Secondary | ICD-10-CM

## 2023-10-27 NOTE — Telephone Encounter (Signed)
Called patient to let her know that her modified barium swallow test showed regurgitation and Dr. Tobi Bastos was recommending for her to have an EGD done. Patient agreed and is scheduled to have the EGD done on 11/05/2023. I let the patient know that she is not to inject Ozempic 7 days prior and that her insulin, she is to inject 1/2 a dose the night before her procedure. Patient was also informed to call the endoscopy unit the day before to get her time of arrival and that I would be sending her instructions via MyChart. Patient understood and had no further questions.

## 2023-10-27 NOTE — Telephone Encounter (Signed)
  Chief Complaint: nail evulsion Symptoms: acrylic nail ripped off 3 days ago.  Frequency: 3 days Pertinent Negatives: Patient denies fever, drainage, red streak, pain  Disposition: [] ED /[] Urgent Care (no appt availability in office) / [x] Appointment(In office/virtual)/ []  [facility]/ [] Home Care/ [] Refused Recommended Disposition /[] [facility]/ []  Follow-up with PCP  Additional Notes: Pt states that she wears acrylic nail and while doing hair 3 days ago she noticed it was starting to hurt. Pt lost her nail. Pt states that she had develop an odorous pus to the area and a small portion of nail area is black. Pt states that there is no drainage at this time. Pt denies fevers. Scheduled for tomorrow, advised to call back should s/s worsen.  Denies DM.   Copied from CRM [id number]. Topic: Clinical - Red Word Triage >> Oct 27, 2023  1:49 PM Florestine Avers wrote: Red Word that prompted transfer to Nurse Triage: Patient called in stating that her nail came off over 2 weeks ago, patient thinks it may be infected. Reason for Disposition  Wound infection suspected by triager (e.g., other signs of wound infection)  Answer Assessment - Initial Assessment Questions 1. LOCATION: "Where is the wound located?"      R sided pointed finger 2. WOUND APPEARANCE: "What does the wound look like?"      Black at the cuticle 3. SIZE: If redness is present, ask: "What is the size of the red area?" (Inches, centimeters, or compare to size of a coin)      denies 4. SPREAD: "What's changed in the last day?"  "Do you see any red streaks coming from the wound?"     No mam 5. ONSET: "When did it start to look infected?"      3 days 6. MECHANISM: "How did the wound start, what was the cause?"     Acrylic nail was ripped off 7. PAIN: Do you have any pain?"  If Yes, ask: "How bad is the pain?"  (e.g., Scale 1-10; mild, moderate, or severe)    - MILD (1-3): Doesn't interfere with normal activities.      - MODERATE (4-7): Interferes with normal activities or awakens from sleep.    - SEVERE (8-10): Excruciating pain, unable to do any normal activities.       0 8. FEVER: "Do you have a fever?" If Yes, ask: "What is your temperature, how was it measured, and when did it start?"     denies 9. OTHER SYMPTOMS: "Do you have any other symptoms?" (e.g., shaking chills, weakness, rash elsewhere on body)     denies  Protocols used: Wound Infection Suspected-A-AH

## 2023-10-27 NOTE — Telephone Encounter (Signed)
 Pt called back and canceled appt

## 2023-10-27 NOTE — Telephone Encounter (Signed)
Call pt  she may cancel as I previously seen her for vaginal bleeding. Sch f/u visit in 6 weeks

## 2023-10-27 NOTE — Telephone Encounter (Signed)
Noted  

## 2023-10-27 NOTE — Telephone Encounter (Signed)
Copied from CRM [id number]. Topic: General - Other >> Oct 27, 2023  9:13 AM Elizebeth Brooking wrote: Reason for CRM: Patient called in stating she was seen on the 17th would like to know if she still needs to come in today, I ask patient if she was treated for what she was seen for on the 17 she stated yes, but she just wants to make sure before she cancels would like a call back from nurse to confirm

## 2023-10-27 NOTE — Telephone Encounter (Signed)
-----   Message from [name] sent at 10/15/2023  2:31 PM EST ----- Requires EGD due to regurgitation seen on [facility]- asaP

## 2023-10-28 ENCOUNTER — Ambulatory Visit: Payer: 59 | Admitting: Internal Medicine

## 2023-10-28 ENCOUNTER — Encounter: Payer: 59 | Admitting: Obstetrics

## 2023-10-28 ENCOUNTER — Encounter: Payer: Self-pay | Admitting: Family

## 2023-10-28 LAB — URINE CULTURE
MICRO NUMBER:: 15982966
Result:: NO GROWTH
SPECIMEN QUALITY:: ADEQUATE

## 2023-10-28 LAB — PAT ID TIQ DOC: Test Affected: 395

## 2023-10-28 NOTE — Telephone Encounter (Signed)
Pt canceled appt due to the weather.

## 2023-10-29 ENCOUNTER — Encounter: Payer: Self-pay | Admitting: Family

## 2023-10-29 ENCOUNTER — Ambulatory Visit
Admission: RE | Admit: 2023-10-29 | Discharge: 2023-10-29 | Disposition: A | Discharge status: Home / Self Care | Payer: 59 | Source: Ambulatory Visit | Attending: Family | Admitting: Family

## 2023-10-29 ENCOUNTER — Encounter: Payer: Self-pay | Admitting: Gastroenterology

## 2023-10-29 DIAGNOSIS — M5137 Other intervertebral disc degeneration, lumbosacral region with discogenic back pain only: Secondary | ICD-10-CM | POA: Diagnosis not present

## 2023-10-29 DIAGNOSIS — M5417 Radiculopathy, lumbosacral region: Secondary | ICD-10-CM | POA: Insufficient documentation

## 2023-10-29 DIAGNOSIS — M48061 Spinal stenosis, lumbar region without neurogenic claudication: Secondary | ICD-10-CM | POA: Diagnosis not present

## 2023-10-29 DIAGNOSIS — M47816 Spondylosis without myelopathy or radiculopathy, lumbar region: Secondary | ICD-10-CM | POA: Diagnosis not present

## 2023-10-29 DIAGNOSIS — M79604 Pain in right leg: Secondary | ICD-10-CM | POA: Diagnosis not present

## 2023-10-29 NOTE — Assessment & Plan Note (Addendum)
Resolved at this time.  Advised to complete metronidazole for treatment of trichomonas. advised to let me know if vaginal bleeding were to recur.  I suspect related to initial pelvic exam.  Deferred pelvic exam today in the absence of bleeding.

## 2023-10-30 ENCOUNTER — Telehealth: Payer: Self-pay

## 2023-10-30 DIAGNOSIS — M25562 Pain in left knee: Secondary | ICD-10-CM | POA: Diagnosis not present

## 2023-10-30 DIAGNOSIS — M170 Bilateral primary osteoarthritis of knee: Secondary | ICD-10-CM | POA: Diagnosis not present

## 2023-10-30 DIAGNOSIS — M25561 Pain in right knee: Secondary | ICD-10-CM | POA: Diagnosis not present

## 2023-10-30 NOTE — Telephone Encounter (Signed)
Heather Sweeney

## 2023-11-02 NOTE — Telephone Encounter (Signed)
Pt called back after a missed call. She states no longer symptomatic and needing a refill. TOC nurse visit scheduled for 2/13.

## 2023-11-02 NOTE — Telephone Encounter (Signed)
Attempted to contact patient for clarification was unable to reach patient because voicemail box is full. Will reach back out to patient again at a later time. KW

## 2023-11-03 ENCOUNTER — Ambulatory Visit: Payer: 59 | Admitting: Gastroenterology

## 2023-11-05 ENCOUNTER — Encounter
Admission: RE | Disposition: A | Discharge status: Home / Self Care | Payer: Self-pay | Source: Home / Self Care | Attending: Gastroenterology

## 2023-11-05 ENCOUNTER — Ambulatory Visit
Admission: RE | Admit: 2023-11-05 | Discharge: 2023-11-05 | Disposition: A | Discharge status: Home / Self Care | Payer: 59 | Attending: Gastroenterology | Admitting: Gastroenterology

## 2023-11-05 ENCOUNTER — Ambulatory Visit: Payer: 59 | Admitting: General Practice

## 2023-11-05 ENCOUNTER — Encounter: Payer: Self-pay | Admitting: Gastroenterology

## 2023-11-05 DIAGNOSIS — W44F3XA Food entering into or through a natural orifice, initial encounter: Secondary | ICD-10-CM | POA: Diagnosis not present

## 2023-11-05 DIAGNOSIS — R1319 Other dysphagia: Secondary | ICD-10-CM

## 2023-11-05 DIAGNOSIS — F17290 Nicotine dependence, other tobacco product, uncomplicated: Secondary | ICD-10-CM | POA: Diagnosis not present

## 2023-11-05 DIAGNOSIS — E119 Type 2 diabetes mellitus without complications: Secondary | ICD-10-CM | POA: Insufficient documentation

## 2023-11-05 DIAGNOSIS — K224 Dyskinesia of esophagus: Secondary | ICD-10-CM | POA: Insufficient documentation

## 2023-11-05 DIAGNOSIS — Z794 Long term (current) use of insulin: Secondary | ICD-10-CM | POA: Diagnosis not present

## 2023-11-05 DIAGNOSIS — I10 Essential (primary) hypertension: Secondary | ICD-10-CM | POA: Insufficient documentation

## 2023-11-05 DIAGNOSIS — T18128A Food in esophagus causing other injury, initial encounter: Secondary | ICD-10-CM | POA: Diagnosis not present

## 2023-11-05 DIAGNOSIS — G4733 Obstructive sleep apnea (adult) (pediatric): Secondary | ICD-10-CM | POA: Diagnosis not present

## 2023-11-05 DIAGNOSIS — R1310 Dysphagia, unspecified: Secondary | ICD-10-CM | POA: Diagnosis present

## 2023-11-05 DIAGNOSIS — I129 Hypertensive chronic kidney disease with stage 1 through stage 4 chronic kidney disease, or unspecified chronic kidney disease: Secondary | ICD-10-CM | POA: Diagnosis not present

## 2023-11-05 DIAGNOSIS — K219 Gastro-esophageal reflux disease without esophagitis: Secondary | ICD-10-CM | POA: Insufficient documentation

## 2023-11-05 DIAGNOSIS — R1110 Vomiting, unspecified: Secondary | ICD-10-CM | POA: Diagnosis not present

## 2023-11-05 DIAGNOSIS — E1122 Type 2 diabetes mellitus with diabetic chronic kidney disease: Secondary | ICD-10-CM | POA: Diagnosis not present

## 2023-11-05 DIAGNOSIS — Z87891 Personal history of nicotine dependence: Secondary | ICD-10-CM | POA: Diagnosis not present

## 2023-11-05 DIAGNOSIS — E66813 Obesity, class 3: Secondary | ICD-10-CM | POA: Diagnosis not present

## 2023-11-05 DIAGNOSIS — N1831 Chronic kidney disease, stage 3a: Secondary | ICD-10-CM | POA: Diagnosis not present

## 2023-11-05 DIAGNOSIS — Z6841 Body Mass Index (BMI) 40.0 and over, adult: Secondary | ICD-10-CM | POA: Insufficient documentation

## 2023-11-05 HISTORY — DX: Type 2 diabetes mellitus without complications: E11.9

## 2023-11-05 HISTORY — PX: ESOPHAGOGASTRODUODENOSCOPY (EGD) WITH PROPOFOL: SHX5813

## 2023-11-05 LAB — GLUCOSE, CAPILLARY
Glucose-Capillary: 64 mg/dL — ABNORMAL LOW (ref 70–99)
Glucose-Capillary: 102 mg/dL — ABNORMAL HIGH (ref 70–99)

## 2023-11-05 SURGERY — ESOPHAGOGASTRODUODENOSCOPY (EGD) WITH PROPOFOL
Anesthesia: General

## 2023-11-05 MED ORDER — PROPOFOL 10 MG/ML IV BOLUS
INTRAVENOUS | Status: DC | PRN
  Administered 2023-11-05: 40 mg via INTRAVENOUS
  Administered 2023-11-05 (×2): 20 mg via INTRAVENOUS

## 2023-11-05 MED ORDER — SODIUM CHLORIDE 0.9 % IV SOLN: INTRAVENOUS | Status: DC

## 2023-11-05 MED ORDER — LIDOCAINE HCL (PF) 1 % IJ SOLN
INTRAMUSCULAR | Status: DC | PRN
  Administered 2023-11-05: 60 mg

## 2023-11-05 MED ORDER — DEXTROSE 50 % IV SOLN
25.0000 mL | Freq: Once | INTRAVENOUS | Status: AC
  Administered 2023-11-05: 25 mL via INTRAVENOUS

## 2023-11-05 NOTE — Anesthesia Postprocedure Evaluation (Signed)
Anesthesia Post Note  Patient: Heather Sweeney  Procedure(s) Performed: ESOPHAGOGASTRODUODENOSCOPY (EGD) WITH PROPOFOL  Patient location during evaluation: PACU Anesthesia Type: General Level of consciousness: awake and alert Pain management: pain level controlled Vital Signs Assessment: post-procedure vital signs reviewed and stable Respiratory status: spontaneous breathing, nonlabored ventilation, respiratory function stable and patient connected to nasal cannula oxygen Cardiovascular status: blood pressure returned to baseline and stable Postop Assessment: no apparent nausea or vomiting Anesthetic complications: no  No notable events documented.   Last Vitals:  Vitals:   11/05/23 1120 11/05/23 1133  BP: 125/87 (!) 134/90  Pulse: 98 85  Resp: (!) 21 17  Temp: (!) 35.6 C   SpO2: 100%     Last Pain:  Vitals:   11/05/23 1133  TempSrc:   PainSc: 0-No pain                 Stephanie Coup

## 2023-11-05 NOTE — Anesthesia Preprocedure Evaluation (Signed)
Anesthesia Evaluation  Patient identified by MRN, date of birth, ID band Patient awake    Reviewed: Allergy & Precautions, NPO status , Patient's Chart, lab work & pertinent test results  Airway Mallampati: III  TM Distance: >3 FB Neck ROM: full    Dental  (+) Chipped   Pulmonary sleep apnea , former smoker   Pulmonary exam normal breath sounds clear to auscultation       Cardiovascular hypertension, On Medications negative cardio ROS Normal cardiovascular exam Rhythm:Regular Rate:Normal  TTE 2017 Normal EF, mild MR   Neuro/Psych Seizures -, Well Controlled,  PSYCHIATRIC DISORDERS Anxiety Depression Bipolar Disorder      GI/Hepatic Neg liver ROS,GERD  ,,  Endo/Other  diabetes  Class 3 obesity  Renal/GU negative Renal ROS  negative genitourinary   Musculoskeletal   Abdominal   Peds  Hematology negative hematology ROS (+)   Anesthesia Other Findings Past Medical History: 02/12/2021: Abnormal MRI, lumbar spine 07/10/2022: Abrasion of anterior left lower leg No date: Allergic rhinitis 04/20/2020: Annual physical exam No date: Anxiety 10/25/2020: Aperistalsis, esophagus No date: Arthritis of knee, right 10/25/2020: Arthritis of right knee No date: Asthma 10/20/2019: Bacterial vaginosis No date: Bipolar disorder (HCC) 03/07/2022: BMI 37.0-37.9, adult 06/17/2022: BMI 40.0-44.9, adult (HCC) 04/04/2022: Bronchiectasis without complication (HCC) No date: Chronic low back pain 12/17/2021: Chronic midline low back pain 04/09/2022: Class 3 severe obesity due to excess calories in adult  [facility]) No date: CTS (carpal tunnel syndrome)     Comment:  hands No date: Depression No date: Diabetes mellitus without complication (HCC) 02/11/2022: Early satiety 08/17/2020: Elevated liver enzymes No date: Encounter for screening colonoscopy 10/10/2022: Encounter for screening for COVID-19 09/24/2016: Frequent PVCs No date:  Gallstones No date: GERD (gastroesophageal reflux disease) No date: History of prediabetes 02/23/2019: Hives No date: Hypertension 12/13/2021: Hypokalemia 02/12/2021: Incontinence of feces 04/20/2020: Ingrown right big toenail 12/13/2021: Iron deficiency anemia No date: Laceration of right foot 08/24/2022: Lymphedema 09/10/2021: Obesity (BMI 30-39.9) No date: Onychomycosis No date: OSA on CPAP     Comment:  does not use cpap 12/17/2021: Osteoarthritis of knee No date: Overactive bladder 04/20/2020: Pain and swelling of right knee No date: PVC's (premature ventricular contractions) No date: PVC's (premature ventricular contractions)     Comment:  Dr. Kirtland Bouchard cards 01/12/2018: S/P laparoscopic cholecystectomy April 2019 No date: Seizures [facility])     Comment:  8th grade 10/10/2022: Tachycardia 02/23/2019: Urticaria 02/11/2022: Weight loss  Past Surgical History: 07/08/2011: ABDOMINAL HYSTERECTOMY No date: CARPAL TUNNEL RELEASE     Comment:  right hand 1996 No date: CESAREAN SECTION     Comment:  2012 01/12/2018: CHOLECYSTECTOMY; N/A     Comment:  Procedure: LAPAROSCOPIC CHOLECYSTECTOMY WITH               INTRAOPERATIVE CHOLANGIOGRAM ERAS PATHWAY;  Surgeon:               Luretha Murphy, MD;  Location: [facility] ORS;  Service:               General;  Laterality: N/A; 04/15/2019: COLONOSCOPY WITH PROPOFOL; N/A     Comment:  Procedure: COLONOSCOPY WITH PROPOFOL;  Surgeon: [name], MD;  Location: [facility] ENDOSCOPY;  Service:               Gastroenterology;  Laterality: N/A; No date: DILATION AND CURETTAGE OF UTERUS     Comment:  04/2011 benign endometrial polyp squamous metaplasia,  inflamm, blood, mucous, benign  10/23/2020: ESOPHAGOGASTRODUODENOSCOPY (EGD) WITH PROPOFOL; N/A     Comment:  Procedure: ESOPHAGOGASTRODUODENOSCOPY (EGD) WITH               PROPOFOL;  Surgeon: [name], MD;  Location: [facility]               ENDOSCOPY;  Service: Gastroenterology;   Laterality: N/A; 02/15/2021: FLEXIBLE SIGMOIDOSCOPY; N/A     Comment:  Procedure: FLEXIBLE SIGMOIDOSCOPY;  Surgeon: [name], MD;  Location: [facility] END[facility]COPY;  Service:               Gastroenterology;  Laterality: N/A; No date: FOOT SURGERY     Comment:  Dr. Stacie Acres  No date: LEEP     Comment:  2006 04/24/2022: PANNICULECTOMY; N/A     Comment:  Procedure: INFRAUMBILICAL PANNICULECTOMY;  Surgeon:               Peggye Form, DO;  Location: [facility] OR;  Service:               Plastics;  Laterality: N/A; 10/2022: Right hand carpal tunnel release; Right 04/2023: scar tissue repair     Comment:  scar tissue from having children and tummy tuck No date: TUBAL LIGATION  BMI    Body Mass Index: 40.60 kg/m      Reproductive/Obstetrics negative OB ROS                             Anesthesia Physical Anesthesia Plan  ASA: 3  Anesthesia Plan: General   Post-op Pain Management: Minimal or no pain anticipated   Induction: Intravenous  PONV Risk Score and Plan: 3 and Propofol infusion, TIVA and Ondansetron  Airway Management Planned: Nasal Cannula  Additional Equipment: None  Intra-op Plan:   Post-operative Plan:   Informed Consent: I have reviewed the patients History and Physical, chart, labs and discussed the procedure including the risks, benefits and alternatives for the proposed anesthesia with the patient or authorized representative who has indicated his/her understanding and acceptance.     Dental advisory given  Plan Discussed with: CRNA and Surgeon  Anesthesia Plan Comments: (Discussed risks of anesthesia with patient, including possibility of difficulty with spontaneous ventilation under anesthesia necessitating airway intervention, PONV, and rare risks such as cardiac or respiratory or neurological events, and allergic reactions. Discussed the role of CRNA in patient's perioperative care. Patient understands.)        Anesthesia Quick Evaluation

## 2023-11-05 NOTE — H&P (Signed)
[name], MD [address], [place], [place], [address] [address], [place], [place], [address] Phone: [phone]  Fax: [phone]  Primary Care Physician:  Allegra Grana, FNP   Pre-Procedure History & Physical: HPI:  Heather Sweeney is a 58 y.o. female is here for an endoscopy    Past Medical History:  Diagnosis Date   Abnormal MRI, lumbar spine 02/12/2021   Abrasion of anterior left lower leg 07/10/2022   Allergic rhinitis    Annual physical exam 04/20/2020   Anxiety    Aperistalsis, esophagus 10/25/2020   Arthritis of knee, right    Arthritis of right knee 10/25/2020   Asthma    Bacterial vaginosis 10/20/2019   Bipolar disorder (HCC)    BMI 37.0-37.9, adult 03/07/2022   BMI 40.0-44.9, adult (HCC) 06/17/2022   Bronchiectasis without complication (HCC) 04/04/2022   Chronic low back pain    Chronic midline low back pain 12/17/2021   Class 3 severe obesity due to excess calories in adult [facility]) 04/09/2022   CTS (carpal tunnel syndrome)    hands   Depression    Diabetes mellitus without complication (HCC)    Early satiety 02/11/2022   Elevated liver enzymes 08/17/2020   Encounter for screening colonoscopy    Encounter for screening for COVID-19 10/10/2022   Frequent PVCs 09/24/2016   Gallstones    GERD (gastroesophageal reflux disease)    History of prediabetes    Hives 02/23/2019   Hypertension    Hypokalemia 12/13/2021   Incontinence of feces 02/12/2021   Ingrown right big toenail 04/20/2020   Iron deficiency anemia 12/13/2021   Laceration of right foot    Lymphedema 08/24/2022   Obesity (BMI 30-39.9) 09/10/2021   Onychomycosis    OSA on CPAP    does not use cpap   Osteoarthritis of knee 12/17/2021   Overactive bladder    Pain and swelling of right knee 04/20/2020   PVC's (premature ventricular contractions)    PVC's (premature ventricular contractions)    Dr. Kirtland Bouchard cards   S/P laparoscopic cholecystectomy April 2019  01/12/2018   Seizures (HCC)    8th grade   Tachycardia 10/10/2022   Urticaria 02/23/2019   Weight loss 02/11/2022    Past Surgical History:  Procedure Laterality Date   ABDOMINAL HYSTERECTOMY  07/08/2011   CARPAL TUNNEL RELEASE     right hand 1996   CESAREAN SECTION     2012   CHOLECYSTECTOMY N/A 01/12/2018   Procedure: LAPAROSCOPIC CHOLECYSTECTOMY WITH INTRAOPERATIVE CHOLANGIOGRAM ERAS PATHWAY;  Surgeon: Luretha Murphy, MD;  Location: [facility] ORS;  Service: General;  Laterality: N/A;   COLONOSCOPY WITH PROPOFOL N/A 04/15/2019   Procedure: COLONOSCOPY WITH PROPOFOL;  Surgeon: [name], MD;  Location: [facility] ENDOSCOPY;  Service: Gastroenterology;  Laterality: N/A;   DILATION AND CURETTAGE OF UTERUS     04/2011 benign endometrial polyp squamous metaplasia, inflamm, blood, mucous, benign    ESOPHAGOGASTRODUODENOSCOPY (EGD) WITH PROPOFOL N/A 10/23/2020   Procedure: ESOPHAGOGASTRODUODENOSCOPY (EGD) WITH PROPOFOL;  Surgeon: [name], MD;  Location: [facility] ENDOSCOPY;  Service: Gastroenterology;  Laterality: N/A;   FLEXIBLE SIGMOIDOSCOPY N/A 02/15/2021   Procedure: FLEXIBLE SIGMOIDOSCOPY;  Surgeon: [name], MD;  Location: [facility] ENDOSCOPY;  Service: Gastroenterology;  Laterality: N/A;   FOOT SURGERY     Dr. Stacie Acres    LEEP     2006   PANNICULECTOMY N/A 04/24/2022   Procedure: INFRAUMBILICAL PANNICULECTOMY;  Surgeon: Peggye Form, DO;  Location: [facility] OR;  Service: Plastics;  Laterality: N/A;   Right hand carpal tunnel release Right 10/2022   scar tissue repair  04/2023   scar tissue from having children and tummy tuck   TUBAL LIGATION      Prior to Admission medications   Medication Sig Start Date End Date Taking? Authorizing Provider  Semaglutide,0.25 or 0.5MG /DOS, 2 MG/3ML SOPN Inject 0.5 mg into the skin once a week. 09/22/23  Yes Allegra Grana, FNP  ACCU-CHEK GUIDE test strip Use as instructed 06/07/22   Eulis Foster, FNP  albuterol (VENTOLIN HFA) 108 (90 Base) MCG/ACT  inhaler Inhale 1-2 puffs into the lungs every 6 (six) hours as needed for wheezing or shortness of breath. 04/04/22   McLean-Scocuzza, Pasty Spillers, MD  amLODipine (NORVASC) 5 MG tablet Take 1 tablet (5 mg total) by mouth daily. 12/22/22   Dana Allan, MD  benztropine (COGENTIN) 1 MG tablet Take 1 mg by mouth 2 (two) times daily. 08/10/20   [provider]  Blood Glucose Monitoring Suppl (ACCU-CHEK GUIDE ME) w/Device KIT Use as directed,once daily to test blood sugar 05/22/22   Worthy Rancher B, FNP  buprenorphine (BUTRANS) 10 MCG/HR PTWK Place 10 patches onto the skin once a week.    [provider]  buPROPion (WELLBUTRIN XL) 150 MG 24 hr tablet Take 300 mg by mouth every morning. 09/02/21   [provider]  busPIRone (BUSPAR) 15 MG tablet Take 15 mg by mouth 2 (two) times daily. 11/26/22   [provider]  EPINEPHrine 0.3 mg/0.3 mL IJ SOAJ injection Inject 0.3 mg into the muscle as needed for anaphylaxis. 03/07/22   McLean-Scocuzza, Pasty Spillers, MD  famotidine (PEPCID) 40 MG tablet Take 1 tablet (40 mg total) by mouth daily. 12/22/22   Dana Allan, MD  FLUoxetine (PROZAC) 40 MG capsule Take 40 mg by mouth daily. 07/15/20   [provider]  fluPHENAZine (PROLIXIN) 5 MG tablet Take 5 mg by mouth 2 (two) times daily. 11/14/22   [provider]  fluticasone (FLONASE) 50 MCG/ACT nasal spray Place 2 sprays into both nostrils daily. 12/22/22   Dana Allan, MD  hydrOXYzine (ATARAX) 50 MG tablet Take 50 mg by mouth 2 (two) times daily as needed for anxiety. 03/10/22   [provider]  Insulin Pen Needle (NOVOFINE) 30G X 8 MM MISC Use with victoza daily 1.8 mg 12/22/22   Dana Allan, MD  metoprolol succinate (TOPROL-XL) 25 MG 24 hr tablet Take 25 mg by mouth daily. 12/10/22   [provider]  metroNIDAZOLE (FLAGYL) 500 MG tablet Take 1 tab BID for 7 days; NO alcohol use for 10 days after prescription start 12/02/22   Copland, Ilona Sorrel, PA-C  Multiple  Vitamins-Minerals (ALIVE MULTI-VITAMIN PO) Take by mouth daily in the afternoon.    [provider]  nicotine (NICODERM CQ - DOSED IN MG/24 HOURS) 21 mg/24hr patch Place 1 patch (21 mg total) onto the skin daily. For 6 weeks, then call office for 14mg /24 hr nicotine patch. 10/05/23   Allegra Grana, FNP  nicotine polacrilex (NICORETTE) 4 MG gum Take 1 each (4 mg total) by mouth as needed for smoking cessation. 10/05/23   Allegra Grana, FNP  omalizumab Geoffry Paradise) 150 MG/ML prefilled syringe  07/10/22   [provider]  omeprazole (PRILOSEC) 40 MG capsule Take 1 capsule (40 mg total) by mouth 2 times daily at 12 noon and 4 pm. 09/21/23   [name], MD  scopolamine (TRANSDERM-SCOP) 1 MG/3DAYS Place 1 patch (1.5 mg total) onto  the skin every 3 (three) days. 4 hours before you get on boat 03/07/22   McLean-Scocuzza, Pasty Spillers, MD  topiramate (TOPAMAX) 25 MG tablet Take 1 tablet (25 mg total) by mouth 2 (two) times daily. 07/02/23   Raulkar, Drema Pry, MD  vitamin B-12 (CYANOCOBALAMIN) 1000 MCG tablet Take 1,000 mcg by mouth daily.    [provider]    Allergies as of 10/27/2023 - Review Complete 10/23/2023  Allergen Reaction Noted   Cozaar [losartan] Hives and Swelling 07/08/2017   Keflex [cephalexin] Swelling and Rash 11/01/2015   Penicillins Rash 11/01/2015   Zestril [lisinopril] Hives and Other (See Comments) 11/01/2015   Hctz [hydrochlorothiazide] Swelling and Rash 11/01/2015   Bee venom Swelling 12/27/2018   Metformin and related Rash     Family History  Problem Relation Age of Onset   Heart failure Mother    Hypertension Mother    Early death Mother 33   Heart disease Mother 93       chf   Cancer Father 31       esophageal    Hypertension Father    Breast cancer Sister 103   Mental illness Sister    Heart failure Sister    Asthma Son    Cancer Paternal Aunt        brain cancer   Thyroid cancer Neg Hx     Social History   Socioeconomic History    Marital status: Married    Spouse name: Not on file   Number of children: Not on file   Years of education: Not on file   Highest education level: Not on file  Occupational History   Not on file  Tobacco Use   Smoking status: Former    Types: E-cigarettes   Smokeless tobacco: Current    Types: Chew   Tobacco comments:    She uses dip daily. She hasn't smoke cigarettes.  Vaping Use   Vaping status: Never Used  Substance and Sexual Activity   Alcohol use: Not Currently    Comment: last use 8 years ago   Drug use: Not Currently    Types: Marijuana    Comment: last use age 60   Sexual activity: Not Currently    Partners: Male    Birth control/protection: Surgical    Comment: Hysterectomy 2012  Other Topics Concern   Not on file  Social History Narrative   Married    No guns    Wears seat belt    Safe in relationship    Never smoker    2 sons Judie Grieve ~10 and another son ~28 as of 10/2019   1 granson eli 1 y.o as of 10/2019   2 sisters    Unemployed       Social Drivers of Corporate investment banker Strain: Low Risk  (09/22/2023)   Overall Financial Resource Strain (CARDIA)    Difficulty of Paying Living Expenses: Not hard at all  Food Insecurity: No Food Insecurity (09/22/2023)   Hunger Vital Sign    Worried About Running Out of Food in the Last Year: Never true    Ran Out of Food in the Last Year: Never true  Transportation Needs: No Transportation Needs (09/22/2023)   PRAPARE - Administrator, Civil Service (Medical): No    Lack of Transportation (Non-Medical): No  Physical Activity: Inactive (09/22/2023)   Exercise Vital Sign    Days of Exercise per Week: 0 days    Minutes of Exercise  per Session: 0 min  Stress: Stress Concern Present (09/22/2023)   [employer] of Occupational Health - Occupational Stress Questionnaire    Feeling of Stress : To some extent  Social Connections: Socially Integrated (09/22/2023)   Social Connection and  Isolation Panel [NHANES]    Frequency of Communication with Friends and Family: More than three times a week    Frequency of Social Gatherings with Friends and Family: More than three times a week    Attends Religious Services: More than 4 times per year    Active Member of [employer] or Organizations: Yes    Attends Engineer, structural: More than 4 times per year    Marital Status: Married  Catering manager Violence: Not At Risk (09/22/2023)   Humiliation, Afraid, Rape, and Kick questionnaire    Fear of Current or Ex-Partner: No    Emotionally Abused: No    Physically Abused: No    Sexually Abused: No    Review of Systems: See HPI, otherwise negative ROS  Physical Exam: There were no vitals taken for this visit. General:   Alert,  pleasant and cooperative in NAD Head:  Normocephalic and atraumatic. Neck:  Supple; no masses or thyromegaly. Lungs:  Clear throughout to auscultation, normal respiratory effort.    Heart:  +S1, +S2, Regular rate and rhythm, No edema. Abdomen:  Soft, nontender and nondistended. Normal bowel sounds, without guarding, and without rebound.   Neurologic:  Alert and  oriented x4;  grossly normal neurologically.  Impression/Plan: Heather Sweeney is here for an endoscopy  to be performed for  evaluation of dysphagia    Risks, benefits, limitations, and alternatives regarding endoscopy have been reviewed with the patient.  Questions have been answered.  All parties agreeable.   [name], MD  11/05/2023, 10:29 AM

## 2023-11-05 NOTE — Transfer of Care (Signed)
Immediate Anesthesia Transfer of Care Note  Patient: Heather Sweeney  Procedure(s) Performed: ESOPHAGOGASTRODUODENOSCOPY (EGD) WITH PROPOFOL  Patient Location: PACU and Endoscopy Unit  Anesthesia Type:MAC  Level of Consciousness: drowsy  Airway & Oxygen Therapy: Patient Spontanous Breathing and Patient connected to nasal cannula oxygen  Post-op Assessment: Report given to RN and Post -op Vital signs reviewed and stable  Post vital signs: Reviewed and stable  Last Vitals:  Vitals Value Taken Time  BP 125/87 11/05/23 1120  Temp    Pulse 94 11/05/23 1120  Resp 20 11/05/23 1120  SpO2 100 % 11/05/23 1120  Vitals shown include unfiled device data.  Last Pain:  Vitals:   11/05/23 1038  TempSrc: Temporal         Complications: No notable events documented.

## 2023-11-05 NOTE — Op Note (Signed)
[facility] Gastroenterology Patient Name: Heather Sweeney Procedure Date: 11/05/2023 11:05 AM MRN: [id number] Account #: [id number] Date of Birth: 02-16-1966 Admit Type: Outpatient Age: 59 Room: [facility] ENDO ROOM 2 Gender: Female Note Status: Finalized Instrument Name: Patton Salles Endoscope [id number] Procedure:             Upper GI endoscopy Indications:           Dysphagia Providers:             [name] MD, MD Referring MD:          [name] MD, MD (Referring MD), Lyn Records. Arnett                         (Referring MD) Medicines:             Monitored Anesthesia Care Complications:         No immediate complications. Procedure:             Pre-Anesthesia Assessment:                        - Prior to the procedure, a History and Physical was                         performed, and patient medications, allergies and                         sensitivities were reviewed. The patient's tolerance                         of previous anesthesia was reviewed.                        - The risks and benefits of the procedure and the                         sedation options and risks were discussed with the                         patient. All questions were answered and informed                         consent was obtained.                        - ASA Grade Assessment: II - A patient with mild                         systemic disease.                        After obtaining informed consent, the endoscope was                         passed under direct vision. Throughout the procedure,                         the patient's blood pressure, pulse, and oxygen                         saturations were monitored  continuously. The Endoscope                         was introduced through the mouth, and advanced to the                         third part of duodenum. The upper GI endoscopy was                         accomplished with ease. The patient tolerated the                          procedure well. Findings:      Food was found in the entire esophagus. food debris seen no large pieces      The stomach was normal.      The examined duodenum was normal.      The cardia and gastric fundus were normal on retroflexion.      Abnormal motility was noted in the esophagus. The cricopharyngeus was       abnormal. There is a decrease in motility of the esophageal body. The       distal esophagus/lower esophageal sphincter is open. Impression:            - Food in the esophagus.                        - Normal stomach.                        - Normal examined duodenum.                        - Abnormal esophageal motility.                        - No specimens collected. Recommendation:        - Discharge patient to home (with escort).                        - Resume previous diet.                        - Continue present medications.                        - Perform ambulatory esophageal manometry in 4 weeks. Procedure Code(s):     --- Professional ---                        [phone], Esophagogastroduodenoscopy, flexible,                         transoral; diagnostic, including collection of                         specimen(s) by brushing or washing, when performed                         (separate procedure) Diagnosis Code(s):     --- Professional ---  I34.742V, Food in esophagus causing other injury,                         initial encounter                        K22.4, Dyskinesia of esophagus                        R13.10, Dysphagia, unspecified CPT copyright 2022 American Medical Association. All rights reserved. The codes documented in this report are preliminary and upon coder review may  be revised to meet current compliance requirements. [name], MD [name] MD, MD 11/05/2023 11:15:42 AM This report has been signed electronically. Number of Addenda: 0 Note Initiated On: 11/05/2023 11:05 AM Estimated Blood Loss:  Estimated blood loss:  none.      [facility]

## 2023-11-06 ENCOUNTER — Ambulatory Visit: Payer: Self-pay | Admitting: Family

## 2023-11-06 ENCOUNTER — Other Ambulatory Visit: Payer: Self-pay

## 2023-11-06 ENCOUNTER — Emergency Department: Payer: 59

## 2023-11-06 ENCOUNTER — Emergency Department
Admission: EM | Admit: 2023-11-06 | Discharge: 2023-11-06 | Disposition: A | Discharge status: Home / Self Care | Payer: 59 | Attending: Student in an Organized Health Care Education/Training Program | Admitting: Student in an Organized Health Care Education/Training Program

## 2023-11-06 ENCOUNTER — Encounter: Payer: 59 | Admitting: Family Medicine

## 2023-11-06 DIAGNOSIS — R0789 Other chest pain: Secondary | ICD-10-CM | POA: Diagnosis not present

## 2023-11-06 DIAGNOSIS — R070 Pain in throat: Secondary | ICD-10-CM | POA: Diagnosis not present

## 2023-11-06 DIAGNOSIS — R079 Chest pain, unspecified: Secondary | ICD-10-CM | POA: Diagnosis not present

## 2023-11-06 LAB — TROPONIN I (HIGH SENSITIVITY)
Troponin I (High Sensitivity): 3 ng/L (ref <18)
Troponin I (High Sensitivity): 3 ng/L (ref <18)

## 2023-11-06 LAB — BASIC METABOLIC PANEL
Anion gap: 11 (ref 5–15)
BUN: 6 mg/dL (ref 6–20)
CO2: 22 mmol/L (ref 22–32)
Calcium: 9.1 mg/dL (ref 8.9–10.3)
Chloride: 107 mmol/L (ref 98–111)
Creatinine, Ser: 0.99 mg/dL (ref 0.44–1.00)
GFR, Estimated: >60 mL/min (ref >60)
Glucose, Bld: 95 mg/dL (ref 70–99)
Potassium: 4 mmol/L (ref 3.5–5.1)
Sodium: 140 mmol/L (ref 135–145)

## 2023-11-06 LAB — CBC
HCT: 34.4 % — ABNORMAL LOW (ref 36.0–46.0)
Hemoglobin: 12.4 g/dL (ref 12.0–15.0)
MCH: 27 pg (ref 26.0–34.0)
MCHC: 36 g/dL (ref 30.0–36.0)
MCV: 74.8 fL — ABNORMAL LOW (ref 80.0–100.0)
Platelets: 224 10*3/uL (ref 150–400)
RBC: 4.6 MIL/uL (ref 3.87–5.11)
RDW: 14.5 % (ref 11.5–15.5)
WBC: 4.9 10*3/uL (ref 4.0–10.5)
nRBC: 0 % (ref 0.0–0.2)

## 2023-11-06 MED ORDER — ALUM & MAG HYDROXIDE-SIMETH 200-200-20 MG/5ML PO SUSP
30.0000 mL | Freq: Once | ORAL | Status: AC
  Administered 2023-11-06: 30 mL via ORAL
  Filled 2023-11-06: qty 30

## 2023-11-06 NOTE — ED Provider Notes (Signed)
[facility] Provider Note    Event Date/Time   First MD Initiated Contact with Patient 11/06/23 1325     (approximate)   History   Chest Pain   HPI  Heather Sweeney is a 58 y.o. female who presents to the ER for evaluation of midsternal nonradiating chest discomfort and throat pain.  Patient had outpatient endoscopy yesterday.  No biopsies.  Otherwise reassuring examination.  She denies any pleuritic pain.  Denies any fevers or chills.  No abdominal pain.     Physical Exam   Triage Vital Signs: ED Triage Vitals [11/06/23 1009]  Encounter Vitals Group     BP 137/81     Systolic BP Percentile      Diastolic BP Percentile      Pulse Rate 92     Resp 18     Temp 98.6 F (37 C)     Temp Source Oral     SpO2 98 %     Weight 230 lb (104.3 kg)     Height 5\' 3"  (1.6 m)     Head Circumference      Peak Flow      Pain Score 8     Pain Loc      Pain Education      Exclude from Growth Chart     Most recent vital signs: Vitals:   11/06/23 1415 11/06/23 1430  BP:    Pulse: 81 84  Resp: 14 13  Temp:    SpO2: 97% 99%     Constitutional: Alert  Eyes: Conjunctivae are normal.  Head: Atraumatic. Nose: No congestion/rhinnorhea. Mouth/Throat: Mucous membranes are moist.   Neck: Painless ROM.  Cardiovascular:   Good peripheral circulation. Respiratory: Normal respiratory effort.  No retractions.  Gastrointestinal: Soft and nontender.  Musculoskeletal:  no deformity Neurologic:  MAE spontaneously. No gross focal neurologic deficits are appreciated.  Skin:  Skin is warm, dry and intact. No rash noted. Psychiatric: Mood and affect are normal. Speech and behavior are normal.    ED Results / Procedures / Treatments   Labs (all labs ordered are listed, but only abnormal results are displayed) Labs Reviewed  CBC - Abnormal; Notable for the following components:      Result Value   HCT 34.4 (*)    MCV 74.8 (*)    All other components within  normal limits  BASIC METABOLIC PANEL  TROPONIN I (HIGH SENSITIVITY)  TROPONIN I (HIGH SENSITIVITY)     EKG  ED ECG REPORT I, Willy Eddy, the attending physician, personally viewed and interpreted this ECG.   Date: 11/06/2023  EKG Time: 10:06  Rate: 85  Rhythm: sinus  Axis: normal  Intervals: normal  ST&T Change: no stemi, no depressions    RADIOLOGY Please see ED Course for my review and interpretation.  I personally reviewed all radiographic images ordered to evaluate for the above acute complaints and reviewed radiology reports and findings.  These findings were personally discussed with the patient.  Please see medical record for radiology report.    PROCEDURES:  Critical Care performed: No  Procedures   MEDICATIONS ORDERED IN ED: Medications  alum & mag hydroxide-simeth (MAALOX/MYLANTA) 200-200-20 MG/5ML suspension 30 mL (30 mLs Oral Given 11/06/23 1348)     IMPRESSION / MDM / ASSESSMENT AND PLAN / ED COURSE  I reviewed the triage vital signs and the nursing notes.  Differential diagnosis includes, but is not limited to, ACS, pericarditis, esophagitis, boerhaaves, pe, dissection, pna, bronchitis, costochondritis  Patient presenting to the ER for evaluation of symptoms as described above.  Based on symptoms, risk factors and considered above differential, this presenting complaint could reflect a potentially life-threatening illness therefore the patient will be placed on continuous pulse oximetry and telemetry for monitoring.  Laboratory evaluation will be sent to evaluate for the above complaints.      Clinical Course as of 11/06/23 1453  Fri Nov 06, 2023  1451 Patient reassessed remains well-appearing in no acute distress.  Serial enzymes were negative.  She did have some improvement after GI cocktail.  Her abdominal exam is soft and benign.  Her vital signs are normal.  Have considered perforation or postendoscopy  complication though she is clinically very well-appearing and have very low clinical suspicion for Boerhaave's or mediastinitis.  Particularly in absence of leukocytosis tachycardia or toxicity.  At this point do believe she stable and appropriate for outpatient follow-up [PR]    Clinical Course User Index [PR] Willy Eddy, MD     FINAL CLINICAL IMPRESSION(S) / ED DIAGNOSES   Final diagnoses:  Atypical chest pain     Rx / DC Orders   ED Discharge Orders     None        Note:  This document was prepared using Dragon voice recognition software and may include unintentional dictation errors.    Willy Eddy, MD 11/06/23 [phone]

## 2023-11-06 NOTE — OR Nursing (Signed)
Pt called to follow up after the procedure.  Patient complained about sore throat and chest pain like someone had hit her in the chest.  Patient advised to seek immediate medical attention by going to the emergency room.  Patient states that she will get her husband to take her in.

## 2023-11-06 NOTE — Telephone Encounter (Signed)
Spoke to pt and she was in th ED then.

## 2023-11-06 NOTE — Telephone Encounter (Signed)
 She is in the ED

## 2023-11-06 NOTE — Telephone Encounter (Signed)
Copied from CRM [id number]. Topic: Clinical - Red Word Triage >> Nov 06, 2023  8:08 AM Adele Barthel wrote: Red Word that prompted transfer to Nurse Triage: Had EGD performed yesterday. Began having chest pain and burning, along with sweating after the anesthesia wore off. Her gastroenterologist recommended she be seen today.  Chief Complaint: chest pain after EGD. Symptoms: burning in chest on left side and it's steady Frequency: constant Pertinent Negatives: Patient denies fever and sob. Disposition: [x] ED /[] Urgent Care (no appt availability in office) / [] Appointment(In office/virtual)/ []  [place] [facility]/ [] Home Care/ [] Refused Recommended Disposition /[] [place] [facility]/ []  Follow-up with PCP Additional Notes: states chest discomfort/burning started yesterday after EGD.  Sister is home with patient.  Instructed to go to er.  Attempted to call husband for patient, went to voice mail and mailbox was full.  PCP office updated.    Reason for Disposition  [1] Chest pain (or "angina") comes and goes AND [2] is happening more often (increasing in frequency) or getting worse (increasing in severity)  (Exception: Chest pains that last only a few seconds.)  Answer Assessment - Initial Assessment Questions 1. LOCATION: "Where does it hurt?"       Burning on left side 2. RADIATION: "Does the pain go anywhere else?" (e.g., into neck, jaw, arms, back)     To neck and back down 3. ONSET: "When did the chest pain begin?" (Minutes, hours or days)      Yesterday when anesthesia wore off.  4. PATTERN: "Does the pain come and go, or has it been constant since it started?"  "Does it get worse with exertion?"      Comes and goes 5. DURATION: "How long does it last" (e.g., seconds, minutes, hours)     steady 6. SEVERITY: "How bad is the pain?"  (e.g., Scale 1-10; mild, moderate, or severe)    - MILD (1-3): doesn't interfere with normal activities     - MODERATE (4-7): interferes with normal  activities or awakens from sleep    - SEVERE (8-10): excruciating pain, unable to do any normal activities       8.5/10 7. CARDIAC RISK FACTORS: "Do you have any history of heart problems or risk factors for heart disease?" (e.g., angina, prior heart attack; diabetes, high blood pressure, high cholesterol, smoker, or strong family history of heart disease)     denies 8. PULMONARY RISK FACTORS: "Do you have any history of lung disease?"  (e.g., blood clots in lung, asthma, emphysema, birth control pills)     asthma 9. CAUSE: "What do you think is causing the chest pain?"     unknown 10. OTHER SYMPTOMS: "Do you have any other symptoms?" (e.g., dizziness, nausea, vomiting, sweating, fever, difficulty breathing, cough)       sweating  Protocols used: Chest Pain-A-AH

## 2023-11-06 NOTE — ED Provider Triage Note (Signed)
Emergency Medicine Provider Triage Evaluation Note  Heather Sweeney , a 58 y.o. female  was evaluated in triage.  Pt complains of chest and throat feel like it is on fire. Chest hurts.  No prior cardiac issues.  Had endoscopy done yesterday, no medication.    Review of Systems  Positive: Chest pain, "hot and cold"  Negative: No fever, n, v, d.   Physical Exam  There were no vitals taken for this visit. Gen:   Awake, no distress   Able to talk in complete sentences.   Resp:  Normal effort  MSK:   Moves extremities without difficulty  Other:    Medical Decision Making  Medically screening exam initiated at 10:04 AM.  Appropriate orders placed.  Heather Sweeney was informed that the remainder of the evaluation will be completed by another provider, this initial triage assessment does not replace that evaluation, and the importance of remaining in the ED until their evaluation is complete.     Tommi Rumps, PA-C 11/06/23 1007

## 2023-11-06 NOTE — ED Triage Notes (Signed)
Pt sts that she had an upper endoscopy yesterday and since than she has been having CP.

## 2023-11-06 NOTE — ED Notes (Signed)
Patient has clear speech. Patient declined difficulty swallowing  food or drink today. Skin is warm and dry.

## 2023-11-09 ENCOUNTER — Encounter: Payer: Self-pay | Admitting: Gastroenterology

## 2023-11-10 ENCOUNTER — Ambulatory Visit: Payer: 59 | Admitting: Gastroenterology

## 2023-11-10 ENCOUNTER — Telehealth: Payer: Self-pay

## 2023-11-10 ENCOUNTER — Encounter: Payer: Self-pay | Admitting: Family

## 2023-11-10 NOTE — Telephone Encounter (Signed)
 This pt I did EGD last week needs esophageal manometry and impedence ph testing at [facility] or [facility] and we should see her after- please schedule procedures and reschedule offioce visit sinc ei told her she has esophageal dysmotility on egd and we need more tests

## 2023-11-11 DIAGNOSIS — M25562 Pain in left knee: Secondary | ICD-10-CM | POA: Diagnosis not present

## 2023-11-11 DIAGNOSIS — M25561 Pain in right knee: Secondary | ICD-10-CM | POA: Diagnosis not present

## 2023-11-11 DIAGNOSIS — M170 Bilateral primary osteoarthritis of knee: Secondary | ICD-10-CM | POA: Diagnosis not present

## 2023-11-16 ENCOUNTER — Other Ambulatory Visit: Payer: Self-pay | Admitting: Certified Nurse Midwife

## 2023-11-16 DIAGNOSIS — M797 Fibromyalgia: Secondary | ICD-10-CM | POA: Diagnosis not present

## 2023-11-16 DIAGNOSIS — M5451 Vertebrogenic low back pain: Secondary | ICD-10-CM | POA: Diagnosis not present

## 2023-11-16 DIAGNOSIS — M25569 Pain in unspecified knee: Secondary | ICD-10-CM | POA: Diagnosis not present

## 2023-11-16 DIAGNOSIS — Z5181 Encounter for therapeutic drug level monitoring: Secondary | ICD-10-CM | POA: Diagnosis not present

## 2023-11-16 DIAGNOSIS — G894 Chronic pain syndrome: Secondary | ICD-10-CM | POA: Diagnosis not present

## 2023-11-16 DIAGNOSIS — N898 Other specified noninflammatory disorders of vagina: Secondary | ICD-10-CM

## 2023-11-16 DIAGNOSIS — Z79891 Long term (current) use of opiate analgesic: Secondary | ICD-10-CM | POA: Diagnosis not present

## 2023-11-16 DIAGNOSIS — M47816 Spondylosis without myelopathy or radiculopathy, lumbar region: Secondary | ICD-10-CM | POA: Diagnosis not present

## 2023-11-18 NOTE — Patient Instructions (Incomplete)
Trichomoniasis  Trichomoniasis is a sexually transmitted infection (STI). Many people with trichomoniasis do not have any symptoms (are asymptomatic) or have only minimal symptoms. Untreated trichomoniasis can last from months to years. This condition is treated with medicine. What are the causes? This condition is caused by a parasite called Trichomonas vaginalis and is transmitted during sexual contact. What increases the risk? The following factors may make you more likely to develop this condition: Having unprotected sex. Having sex with a partner who has trichomoniasis. Having multiple sexual partners. Having had previous trichomoniasis infections or other STIs. What are the signs or symptoms? In females, symptoms of trichomoniasis include: Itching, burning, redness, or soreness in the genital area. Discomfort while urinating. Abnormal vaginal discharge that is clear, white, gray, or yellow-green and foamy and has an unusual fishy odor. In males, symptoms of trichomoniasis include: Discharge from the penis. Burning after urination or ejaculation. Itching or discomfort inside the penis. How is this diagnosed? This condition is diagnosed based on tests. To perform a test, your health care provider will do one of the following: Ask you to provide a urine sample. Take a sample of discharge. The sample may be taken from the vagina or cervix in females and from the urethra in males. Your health care provider may use a swab to collect the sample. Your health care provider may test you for other STIs, including human immunodeficiency virus (HIV). How is this treated?  This condition is treated with medicines such as metronidazole or tinidazole. These are called antimicrobial medicines, and they are taken by mouth (orally). Your sexual partner or partners also need to be tested and treated. If they have the infection and are not treated, you will likely get reinfected. If you plan to become  pregnant or think you may be pregnant, tell your health care provider right away. Some medicines that are used to treat the infection should not be taken during pregnancy. Your health care provider may recommend over-the-counter medicines or creams to help relieve itching or irritation. You may be tested for the infection again 3 months after treatment. Follow these instructions at home: Medicines Take over-the-counter and prescription medicines only as told by your health care provider. Take your antimicrobial medicine as told by your health care provider. Do not stop taking it even if you start to feel better. Use creams as told by your health care provider. General instructions Do not have sex until after you finish your medicine and your symptoms have resolved. Do not wear tampons while you have the infection (if you are female). Talk with your sexual partner or partners about any symptoms that either of you may have, as well as any history of STIs. Keep all follow-up visits. This is important. How is this prevented?  Use condoms every time you have sex. Using condoms correctly and consistently can help protect against STIs. Do not have sexual contact if you have symptoms of trichomoniasis or another STI. Avoid having multiple sexual partners. Get tested for STIs before you have sex with a partner. Ask all partners to do the same. Do not douche (if you are female). Douching may increase your risk for getting STIs due to the removal of good bacteria in the vagina. Contact a health care provider if: You still have symptoms after you finish your medicine. You develop a rash. You plan to become pregnant or think you may be pregnant. Summary Trichomoniasis is a sexually transmitted infection (STI). This condition often has no symptoms or  minimal symptoms. Take your antimicrobial medicine as told by your health care provider. Do not stop taking even if you start to feel better. Discuss your  infection with your sexual partner or partners. Make sure that all partners get tested and treated, if necessary. You should not have sex until after you finish your medicine and your symptoms have resolved. Keep all follow-up visits. This is important. This information is not intended to replace advice given to you by your health care provider. Make sure you discuss any questions you have with your health care provider. Document Revised: 08/21/2021 Document Reviewed: 08/21/2021 Elsevier Patient Education  2024 [employer].

## 2023-11-19 ENCOUNTER — Ambulatory Visit (INDEPENDENT_AMBULATORY_CARE_PROVIDER_SITE_OTHER): Payer: 59

## 2023-11-19 ENCOUNTER — Other Ambulatory Visit (HOSPITAL_COMMUNITY)
Admission: RE | Admit: 2023-11-19 | Discharge: 2023-11-19 | Disposition: A | Discharge status: Home / Self Care | Payer: 59 | Source: Ambulatory Visit | Attending: Certified Nurse Midwife | Admitting: Certified Nurse Midwife

## 2023-11-19 VITALS — BP 120/81 | HR 86 | Resp 16 | Ht 63.0 in | Wt 233.3 lb

## 2023-11-19 DIAGNOSIS — A599 Trichomoniasis, unspecified: Secondary | ICD-10-CM

## 2023-11-19 DIAGNOSIS — N898 Other specified noninflammatory disorders of vagina: Secondary | ICD-10-CM

## 2023-11-19 MED ORDER — METRONIDAZOLE 500 MG PO TABS: ORAL_TABLET | ORAL | 0 refills | Status: DC

## 2023-11-19 NOTE — Telephone Encounter (Signed)
I called pt, but it seemed like someone picked up and would not respond to me saying "hello".... I will try again later

## 2023-11-19 NOTE — Progress Notes (Signed)
    NURSE VISIT NOTE  Subjective:    Patient ID: Heather Sweeney, female    DOB: November 29, 1965, 58 y.o.   MRN: [id number]  HPI  Patient is a 58 y.o. G31P2002 female who presents for [facility] for Trichomoniasis. Patient has history of known exposure to STD. She has been treated with Flagyl. She completed course as prescribed. Partner did not get treated. She denies having any symptoms.   Objective:    BP 120/81   Pulse 86   Resp 16   Ht 5\' 3"  (1.6 m)   Wt 233 lb 4.8 oz (105.8 kg)   BMI 41.33 kg/m    @THIS  VISIT ONLY@  Assessment:   1. Trichomoniasis     trichomonas  Plan:   GC and chlamydia DNA  probe sent to lab. Treatment: Flagyl 500 mg take 4 tablets at one time and abstain from coitus during course of treatment and until labs are back ROV prn if symptoms persist or worsen.   Santiago Bumpers, CMA [facility]

## 2023-11-19 NOTE — Telephone Encounter (Signed)
Per pt want to know if we have gotten her referral to [facility] or [facility]. Pt sates she is in more pain now than before and really need to see someone. [phone]

## 2023-11-23 LAB — CERVICOVAGINAL ANCILLARY ONLY
Bacterial Vaginitis (gardnerella): NEGATIVE
Candida Glabrata: NEGATIVE
Candida Vaginitis: NEGATIVE
Chlamydia: NEGATIVE
Comment: NEGATIVE
Comment: NEGATIVE
Comment: NEGATIVE
Comment: NEGATIVE
Comment: NEGATIVE
Comment: NORMAL
Neisseria Gonorrhea: NEGATIVE
Trichomonas: NEGATIVE

## 2023-11-26 NOTE — Progress Notes (Signed)
 Subjective:    Patient ID: Eleonore Chiquito, female    DOB: 04/01/1966, 58 y.o.   MRN: [id number]  HPI Mrs. Dowding is a 58 year old woman who presents for bilateral knee pain.  1) Bilateral knee pain: -lidocaine patch does not help -she has had pain for 7 years -she has tried muscle relaxers -she has had benefit from percocet in the past, she feels that she would benefit from using this up to 4 times per day -she has done PT and this made her pain worse -she is interested in being able to walk.  -voltaren was not helpful -she tried blue emu cream  2) Obesity: BMI is 40.81 -lost a lot of weight since I last saw her!  Pain Inventory Average Pain 9 Pain Right Now 9 My pain is sharp, burning, and aching  In the last 24 hours, has pain interfered with the following? General activity 10 Relation with others 10 Enjoyment of life 10 What TIME of day is your pain at its worst? morning , daytime, evening, and night Sleep (in general) Fair  Pain is worse with: bending, standing, and some activites Pain improves with: medication and injections Relief from Meds: no pain meds      Family History  Problem Relation Age of Onset   Heart failure Mother    Hypertension Mother    Early death Mother 71   Heart disease Mother 13       chf   Cancer Father 36       esophageal    Hypertension Father    Breast cancer Sister 72   Mental illness Sister    Heart failure Sister    Asthma Son    Cancer Paternal Aunt        brain cancer   Thyroid cancer Neg Hx    Social History   Socioeconomic History   Marital status: Married    Spouse name: Not on file   Number of children: Not on file   Years of education: Not on file   Highest education level: Not on file  Occupational History   Not on file  Tobacco Use   Smoking status: Former    Types: E-cigarettes   Smokeless tobacco: Current    Types: Chew   Tobacco comments:    She uses dip daily. She hasn't smoke cigarettes.   Vaping Use   Vaping status: Never Used  Substance and Sexual Activity   Alcohol use: Not Currently    Comment: last use 8 years ago   Drug use: Not Currently    Types: Marijuana    Comment: last use age 79   Sexual activity: Not Currently    Partners: Male    Birth control/protection: Surgical    Comment: Hysterectomy 2012  Other Topics Concern   Not on file  Social History Narrative   Married    No guns    Wears seat belt    Safe in relationship    Never smoker    2 sons Judie Grieve ~10 and another son ~28 as of 10/2019   1 granson eli 1 y.o as of 10/2019   2 sisters    Unemployed       Social Drivers of Health   Financial Resource Strain: Low Risk  (09/22/2023)   Overall Financial Resource Strain (CARDIA)    Difficulty of Paying Living Expenses: Not hard at all  Food Insecurity: No Food Insecurity (09/22/2023)   Hunger Vital Sign  Worried About Programme researcher, broadcasting/film/video in the Last Year: Never true    Ran Out of Food in the Last Year: Never true  Transportation Needs: No Transportation Needs (09/22/2023)   PRAPARE - Administrator, Civil Service (Medical): No    Lack of Transportation (Non-Medical): No  Physical Activity: Inactive (09/22/2023)   Exercise Vital Sign    Days of Exercise per Week: 0 days    Minutes of Exercise per Session: 0 min  Stress: Stress Concern Present (09/22/2023)   [employer] of Occupational Health - Occupational Stress Questionnaire    Feeling of Stress : To some extent  Social Connections: Socially Integrated (09/22/2023)   Social Connection and Isolation Panel [NHANES]    Frequency of Communication with Friends and Family: More than three times a week    Frequency of Social Gatherings with Friends and Family: More than three times a week    Attends Religious Services: More than 4 times per year    Active Member of Clubs or Organizations: Yes    Attends Banker Meetings: More than 4 times per year    Marital Status:  Married   Past Surgical History:  Procedure Laterality Date   ABDOMINAL HYSTERECTOMY  07/08/2011   CARPAL TUNNEL RELEASE     right hand 1996   CESAREAN SECTION     2012   CHOLECYSTECTOMY N/A 01/12/2018   Procedure: LAPAROSCOPIC CHOLECYSTECTOMY WITH INTRAOPERATIVE CHOLANGIOGRAM ERAS PATHWAY;  Surgeon: Luretha Murphy, MD;  Location: [facility] ORS;  Service: General;  Laterality: N/A;   COLONOSCOPY WITH PROPOFOL N/A 04/15/2019   Procedure: COLONOSCOPY WITH PROPOFOL;  Surgeon: [name], MD;  Location: [facility] ENDOSCOPY;  Service: Gastroenterology;  Laterality: N/A;   DILATION AND CURETTAGE OF UTERUS     04/2011 benign endometrial polyp squamous metaplasia, inflamm, blood, mucous, benign    ESOPHAGOGASTRODUODENOSCOPY (EGD) WITH PROPOFOL N/A 10/23/2020   Procedure: ESOPHAGOGASTRODUODENOSCOPY (EGD) WITH PROPOFOL;  Surgeon: [name], MD;  Location: [facility] ENDOSCOPY;  Service: Gastroenterology;  Laterality: N/A;   ESOPHAGOGASTRODUODENOSCOPY (EGD) WITH PROPOFOL N/A 11/05/2023   Procedure: ESOPHAGOGASTRODUODENOSCOPY (EGD) WITH PROPOFOL;  Surgeon: [name], MD;  Location: [facility] ENDOSCOPY;  Service: Gastroenterology;  Laterality: N/A;   FLEXIBLE SIGMOIDOSCOPY N/A 02/15/2021   Procedure: FLEXIBLE SIGMOIDOSCOPY;  Surgeon: [name], MD;  Location: [facility] ENDOSCOPY;  Service: Gastroenterology;  Laterality: N/A;   FOOT SURGERY     Dr. Stacie Acres    LEEP     2006   PANNICULECTOMY N/A 04/24/2022   Procedure: INFRAUMBILICAL PANNICULECTOMY;  Surgeon: Peggye Form, DO;  Location: [facility] OR;  Service: Plastics;  Laterality: N/A;   Right hand carpal tunnel release Right 10/2022   scar tissue repair  04/2023   scar tissue from having children and tummy tuck   TUBAL LIGATION     Past Medical History:  Diagnosis Date   Abnormal MRI, lumbar spine 02/12/2021   Abrasion of anterior left lower leg 07/10/2022   Allergic rhinitis    Annual physical exam 04/20/2020   Anxiety    Aperistalsis, esophagus 10/25/2020    Arthritis of knee, right    Arthritis of right knee 10/25/2020   Asthma    Bacterial vaginosis 10/20/2019   Bipolar disorder (HCC)    BMI 37.0-37.9, adult 03/07/2022   BMI 40.0-44.9, adult (HCC) 06/17/2022   Bronchiectasis without complication (HCC) 04/04/2022   Chronic low back pain    Chronic midline low back pain 12/17/2021   Class 3 severe obesity due to excess calories in adult (  HCC) 04/09/2022   CTS (carpal tunnel syndrome)    hands   Depression    Diabetes mellitus without complication (HCC)    Early satiety 02/11/2022   Elevated liver enzymes 08/17/2020   Encounter for screening colonoscopy    Encounter for screening for COVID-19 10/10/2022   Frequent PVCs 09/24/2016   Gallstones    GERD (gastroesophageal reflux disease)    History of prediabetes    Hives 02/23/2019   Hypertension    Hypokalemia 12/13/2021   Incontinence of feces 02/12/2021   Ingrown right big toenail 04/20/2020   Iron deficiency anemia 12/13/2021   Laceration of right foot    Lymphedema 08/24/2022   Obesity (BMI 30-39.9) 09/10/2021   Onychomycosis    OSA on CPAP    does not use cpap   Osteoarthritis of knee 12/17/2021   Overactive bladder    Pain and swelling of right knee 04/20/2020   PVC's (premature ventricular contractions)    PVC's (premature ventricular contractions)    Dr. Kirtland Bouchard cards   S/P laparoscopic cholecystectomy April 2019 01/12/2018   Seizures (HCC)    8th grade   Tachycardia 10/10/2022   Urticaria 02/23/2019   Weight loss 02/11/2022   BP 114/69   Pulse 85   Ht 5\' 3"  (1.6 m)   Wt 230 lb 6.4 oz (104.5 kg)   SpO2 96%   BMI 40.81 kg/m   Opioid Risk Score:   Fall Risk Score:  `1  Depression screen [facility] 2/9     11/27/2023   11:02 AM 10/23/2023    8:56 AM 09/22/2023    4:03 PM 09/22/2023   10:42 AM 07/02/2023   10:51 AM 11/05/2022   10:24 AM 10/16/2022    2:00 PM  Depression screen PHQ 2/9  Decreased Interest 0 1 0 0 1 1 0  Down, Depressed, Hopeless 0 2 0 0 1 1 1   PHQ -  2 Score 0 3 0 0 2 2 1   Altered sleeping  2 1  1 1 1   Tired, decreased energy  2 1  1 1 1   Change in appetite  2 3  3 1 1   Feeling bad or failure about yourself   2 1  1 1  0  Trouble concentrating  2 1  1 1 1   Moving slowly or fidgety/restless  2 0  1 0 0  Suicidal thoughts  0 0  0 0 0  PHQ-9 Score  15 7  10 7 5   Difficult doing work/chores  Somewhat difficult Very difficult  Not difficult at all Not difficult at all Not difficult at all      Review of Systems  Musculoskeletal:  Positive for back pain and gait problem.       B/L knee pain   All other systems reviewed and are negative.      Objective:   Physical Exam Gen: no distress, normal appearing HEENT: oral mucosa pink and moist, NCAT Cardio: Reg rate Chest: normal effort, normal rate of breathing Abd: soft, non-distended Ext: no edema Psych: pleasant, normal affect Skin: intact Neuro: Alert and oriented MSK: medial aspect of right knee is swollen, TTP bilateral knees      Assessment & Plan:   1) Bilateral knee pain -MRI right knee ordered previously -PT ordered but this was painful for her, advised that she should not do exercises that are painful -recommended trial of topamax -Provided with a pain relief journal and discussed that it contains foods and lifestyle tips to naturally  help to improve pain. Discussed that these lifestyle strategies are also very good for health unlike some medications which can have negative side effects. Discussed that the act of keeping a journal can be therapeutic and helpful to realize patterns what helps to trigger and alleviate pain.   -discussed voltaren gel -pain contact and urine sample performed today -will send percocet 10mg  QID prn if urine sample contains expected metabolities -Discussed current symptoms of pain and history of pain.  -Discussed benefits of exercise in reducing pain. -Discussed following foods that may reduce pain: 1) Ginger (especially studied for  arthritis)- reduce leukotriene production to decrease inflammation 2) Blueberries- high in phytonutrients that decrease inflammation 3) Salmon- marine omega-3s reduce joint swelling and pain 4) Pumpkin seeds- reduce inflammation 5) dark chocolate- reduces inflammation 6) turmeric- reduces inflammation 7) tart cherries - reduce pain and stiffness 8) extra virgin olive oil - its compound olecanthal helps to block prostaglandins  9) chili peppers- can be eaten or applied topically via capsaicin 10) mint- helpful for headache, muscle aches, joint pain, and itching 11) garlic- reduces inflammation  Link to further information on diet for chronic pain: [email or web address].[email or web address]   2) Obesity: Prescribed topamax 25mg  HS  3)Insomnia: -prescribed topamax 25mg  HS -Try to go outside near sunrise -Get exercise during the day.  -Turn off all devices an hour before bedtime.  -Teas that can benefit: chamomile, valerian root, Brahmi (Bacopa) -Can consider over the counter melatonin, magnesium, and/or L-theanine. Melatonin is an anti-oxidant with multiple health benefits. Magnesium is involved in greater than 300 enzymatic reactions in the body and most of [place] are deficient as our soil is often depleted. There are 7 different types of magnesium- Bioptemizer's is a supplement with all 7 types, and each has unique benefits. Magnesium can also help with constipation and anxiety.  -Pistachios naturally increase the production of melatonin -Cozy Earth bamboo bed sheets are free from toxic chemicals.  -Tart cherry juice or a tart cherry supplement can improve sleep and soreness post-workout

## 2023-11-27 ENCOUNTER — Encounter: Payer: 59 | Attending: Physical Medicine and Rehabilitation | Admitting: Physical Medicine and Rehabilitation

## 2023-11-27 ENCOUNTER — Encounter: Payer: Self-pay | Admitting: Physical Medicine and Rehabilitation

## 2023-11-27 VITALS — BP 114/69 | HR 85 | Ht 63.0 in | Wt 230.4 lb

## 2023-11-27 DIAGNOSIS — Z6841 Body Mass Index (BMI) 40.0 and over, adult: Secondary | ICD-10-CM | POA: Diagnosis present

## 2023-11-27 DIAGNOSIS — M25561 Pain in right knee: Secondary | ICD-10-CM | POA: Diagnosis not present

## 2023-11-27 DIAGNOSIS — Z5181 Encounter for therapeutic drug level monitoring: Secondary | ICD-10-CM | POA: Insufficient documentation

## 2023-11-27 DIAGNOSIS — Z79891 Long term (current) use of opiate analgesic: Secondary | ICD-10-CM | POA: Diagnosis not present

## 2023-11-27 DIAGNOSIS — G4701 Insomnia due to medical condition: Secondary | ICD-10-CM | POA: Diagnosis not present

## 2023-11-27 DIAGNOSIS — G8929 Other chronic pain: Secondary | ICD-10-CM | POA: Insufficient documentation

## 2023-11-27 DIAGNOSIS — M25562 Pain in left knee: Secondary | ICD-10-CM | POA: Insufficient documentation

## 2023-11-27 DIAGNOSIS — G894 Chronic pain syndrome: Secondary | ICD-10-CM | POA: Diagnosis not present

## 2023-11-27 MED ORDER — TOPIRAMATE 25 MG PO TABS: 25.0000 mg | ORAL_TABLET | Freq: Every evening | ORAL | 3 refills | Status: DC

## 2023-11-27 NOTE — Patient Instructions (Signed)
 Foods that may reduce pain: 1) Ginger (especially studied for arthritis)- reduce leukotriene production to decrease inflammation 2) Blueberries- high in phytonutrients that decrease inflammation 3) Salmon- marine omega-3s reduce joint swelling and pain 4) Pumpkin seeds- reduce inflammation 5) dark chocolate- reduces inflammation 6) turmeric- reduces inflammation 7) tart cherries - reduce pain and stiffness 8) extra virgin olive oil - its compound olecanthal helps to block prostaglandins  9) chili peppers- can be eaten or applied topically via capsaicin 10) mint- helpful for headache, muscle aches, joint pain, and itching 11) garlic- reduces inflammation  Link to further information on diet for chronic pain: [email or web address].bray.[email or web address]

## 2023-11-30 ENCOUNTER — Ambulatory Visit (INDEPENDENT_AMBULATORY_CARE_PROVIDER_SITE_OTHER): Payer: 59 | Admitting: Family

## 2023-11-30 ENCOUNTER — Encounter: Payer: Self-pay | Admitting: Family

## 2023-11-30 VITALS — BP 126/76 | HR 78 | Temp 98.1°F | Ht 63.0 in | Wt 233.6 lb

## 2023-11-30 DIAGNOSIS — Z87898 Personal history of other specified conditions: Secondary | ICD-10-CM | POA: Diagnosis not present

## 2023-11-30 DIAGNOSIS — N939 Abnormal uterine and vaginal bleeding, unspecified: Secondary | ICD-10-CM

## 2023-11-30 DIAGNOSIS — I10 Essential (primary) hypertension: Secondary | ICD-10-CM | POA: Diagnosis not present

## 2023-11-30 NOTE — Progress Notes (Unsigned)
 Assessment & Plan:  There are no diagnoses linked to this encounter.   Return precautions given.   Risks, benefits, and alternatives of the medications and treatment plan prescribed today were discussed, and patient expressed understanding.   Education regarding symptom management and diagnosis given to patient on AVS either electronically or printed.  No follow-ups on file.  Rennie Plowman, FNP  Subjective:    Patient ID: Heather Sweeney, female    DOB: 10/14/1965, 58 y.o.   MRN: [id number]  CC: Heather Sweeney is a 58 y.o. female who presents today for follow up.   HPI: HPI  Allergies: Cozaar [losartan], Keflex [cephalexin], Penicillins, Zestril [lisinopril], Hctz [hydrochlorothiazide], Bee venom, and Metformin and related Current Outpatient Medications on File Prior to Visit  Medication Sig Dispense Refill   ACCU-CHEK GUIDE test strip Use as instructed 100 strip 3   albuterol (VENTOLIN HFA) 108 (90 Base) MCG/ACT inhaler Inhale 1-2 puffs into the lungs every 6 (six) hours as needed for wheezing or shortness of breath. 18 g 11   amLODipine (NORVASC) 5 MG tablet Take 1 tablet (5 mg total) by mouth daily. 90 tablet 3   benztropine (COGENTIN) 1 MG tablet Take 1 mg by mouth 2 (two) times daily.     Blood Glucose Monitoring Suppl (ACCU-CHEK GUIDE ME) w/Device KIT Use as directed,once daily to test blood sugar 1 kit 0   buprenorphine (BUTRANS) 10 MCG/HR PTWK Place 10 patches onto the skin once a week.     buPROPion (WELLBUTRIN XL) 150 MG 24 hr tablet Take 300 mg by mouth every morning.     busPIRone (BUSPAR) 15 MG tablet Take 15 mg by mouth 2 (two) times daily.     EPINEPHrine 0.3 mg/0.3 mL IJ SOAJ injection Inject 0.3 mg into the muscle as needed for anaphylaxis. 2 each 3   famotidine (PEPCID) 40 MG tablet Take 1 tablet (40 mg total) by mouth daily. 90 tablet 3   FLUoxetine (PROZAC) 40 MG capsule Take 40 mg by mouth daily.     fluPHENAZine (PROLIXIN) 5 MG tablet Take 5 mg by mouth  2 (two) times daily.     fluticasone (FLONASE) 50 MCG/ACT nasal spray Place 2 sprays into both nostrils daily. 48 g 3   hydrOXYzine (ATARAX) 50 MG tablet Take 50 mg by mouth 2 (two) times daily as needed for anxiety.     Insulin Pen Needle (NOVOFINE) 30G X 8 MM MISC Use with victoza daily 1.8 mg 90 each 3   metoprolol succinate (TOPROL-XL) 25 MG 24 hr tablet Take 25 mg by mouth daily.     Multiple Vitamins-Minerals (ALIVE MULTI-VITAMIN PO) Take by mouth daily in the afternoon.     nicotine (NICODERM CQ - DOSED IN MG/24 HOURS) 21 mg/24hr patch Place 1 patch (21 mg total) onto the skin daily. For 6 weeks, then call office for 14mg /24 hr nicotine patch. 28 patch 2   nicotine polacrilex (NICORETTE) 4 MG gum Take 1 each (4 mg total) by mouth as needed for smoking cessation. 20 tablet 1   omalizumab (XOLAIR) 150 MG/ML prefilled syringe      omeprazole (PRILOSEC) 40 MG capsule Take 1 capsule (40 mg total) by mouth 2 times daily at 12 noon and 4 pm. 120 capsule 0   scopolamine (TRANSDERM-SCOP) 1 MG/3DAYS Place 1 patch (1.5 mg total) onto the skin every 3 (three) days. 4 hours before you get on boat 4 patch 0   Semaglutide,0.25 or 0.5MG /DOS, 2 MG/3ML SOPN Inject 0.5  mg into the skin once a week.     topiramate (TOPAMAX) 25 MG tablet Take 1 tablet (25 mg total) by mouth at bedtime. 90 tablet 3   vitamin B-12 (CYANOCOBALAMIN) 1000 MCG tablet Take 1,000 mcg by mouth daily.     metroNIDAZOLE (FLAGYL) 500 MG tablet Take all four pills at once. (Patient not taking: Reported on 11/30/2023) 14 tablet 0   No current facility-administered medications on file prior to visit.    Review of Systems    Objective:    There were no vitals taken for this visit. BP Readings from Last 3 Encounters:  11/27/23 114/69  11/19/23 120/81  11/06/23 122/64   Wt Readings from Last 3 Encounters:  11/27/23 230 lb 6.4 oz (104.5 kg)  11/19/23 233 lb 4.8 oz (105.8 kg)  11/06/23 230 lb (104.3 kg)    Physical Exam

## 2023-12-01 ENCOUNTER — Telehealth: Payer: Self-pay | Admitting: *Deleted

## 2023-12-01 ENCOUNTER — Other Ambulatory Visit: Payer: Self-pay | Admitting: Physical Medicine and Rehabilitation

## 2023-12-01 LAB — TOXASSURE SELECT,+ANTIDEPR,UR

## 2023-12-01 MED ORDER — OXYCODONE-ACETAMINOPHEN 5-325 MG PO TABS: 1.0000 | ORAL_TABLET | Freq: Four times a day (QID) | ORAL | 0 refills | Status: DC | PRN

## 2023-12-01 NOTE — Assessment & Plan Note (Signed)
-  Resolved.  Will monitor

## 2023-12-01 NOTE — Telephone Encounter (Signed)
-----   Message from Horton Chin sent at 12/01/2023  2:09 PM EST ----- Please let patient know that I have sent this Percocet for her! ----- Message ----- From: Interface, [facility] Lab Results In Sent: 12/01/2023   1:36 PM EST To: Horton Chin, MD

## 2023-12-01 NOTE — Telephone Encounter (Signed)
 I let her know her medication has been sent in. She is asking for compression stockings.

## 2023-12-01 NOTE — Assessment & Plan Note (Signed)
 Chronic, stable.  Continue amlodipine 5mg  , Toprol 25 mg daily

## 2023-12-01 NOTE — Patient Instructions (Signed)
Nice to see you!   

## 2023-12-01 NOTE — Assessment & Plan Note (Signed)
 Lab Results  Component Value Date   HGBA1C 5.3 09/22/2023   Chronic, stable.  Continue Ozempic 0.5 mg once weekly.

## 2023-12-02 ENCOUNTER — Encounter: Payer: Self-pay | Admitting: Certified Nurse Midwife

## 2023-12-02 ENCOUNTER — Telehealth: Payer: Self-pay | Admitting: *Deleted

## 2023-12-02 DIAGNOSIS — I890 Lymphedema, not elsewhere classified: Secondary | ICD-10-CM | POA: Diagnosis not present

## 2023-12-02 NOTE — Telephone Encounter (Signed)
 Mailbox is full and cannot accept messages. I will send a letter informing of non narcotic treatment due to metabolite of cocaine present in UDS.

## 2023-12-02 NOTE — Telephone Encounter (Signed)
-----   Message from Horton Chin sent at 12/02/2023 10:18 AM EST ----- Regarding: FW: Please let patient know we can not prescribe opioids due to cocaine metabolite in urine ----- Message ----- From: Interface, [facility] Lab Results In Sent: 12/01/2023   1:36 PM EST To: Horton Chin, MD

## 2023-12-02 NOTE — Telephone Encounter (Signed)
 Joni Reining with [facility] requesting call back regarding referral for this pt...  I called back and they stated that Joni Reining was already gone for the day, I need to call back tomorrow

## 2023-12-02 NOTE — Telephone Encounter (Signed)
 I have deactivated the Rx sent in yesterday for percocet. It had not been picked up and we are not going to rx opioids.

## 2023-12-02 NOTE — Telephone Encounter (Signed)
 I have deactivated the Rx sent in for percocet since she will be non narcotic tx only.

## 2023-12-09 ENCOUNTER — Ambulatory Visit: Payer: Self-pay | Admitting: Family

## 2023-12-09 DIAGNOSIS — M545 Low back pain, unspecified: Secondary | ICD-10-CM | POA: Diagnosis not present

## 2023-12-09 DIAGNOSIS — M5416 Radiculopathy, lumbar region: Secondary | ICD-10-CM | POA: Diagnosis not present

## 2023-12-09 DIAGNOSIS — M1711 Unilateral primary osteoarthritis, right knee: Secondary | ICD-10-CM | POA: Diagnosis not present

## 2023-12-09 NOTE — Telephone Encounter (Signed)
 Chief Complaint: Right knee swelling/pain Symptoms: Swelling, numbness, sharp pain radiates to back, headache (since Sunday) Frequency: Constant Pertinent Negatives: Patient denies dizziness, vision loss Disposition: [x] ED /[] Urgent Care (no appt availability in office) / [] Appointment(In office/virtual)/ []  [place] [facility]/ [] Home Care/ [] Refused Recommended Disposition /[] [place] [facility]/ []  Follow-up with PCP Additional Notes: Pt states she has experienced constant numbness in her right leg since Thursday.  Pt states the numbness interferes with her ability to walk or drive. Pt states she is in constant 9/10 pain in her right leg. Pt advised to go to ED. This RN offered to call an ambulance for pt but pt declined. Pt has [place] family member taking her to ED now. Pt verbalized understanding and agrees to plan.     Copied from CRM [id number]. Topic: Clinical - Red Word Triage >> Dec 09, 2023 11:52 AM Heather Sweeney wrote: Red Word that prompted transfer to Nurse Triage: Right knee is swollen and unable to walk, experiencing pain as well. Reason for Disposition  [1] SEVERE weakness (i.e., unable to walk or barely able to walk, requires support) AND [2] new-onset or worsening  Answer Assessment - Initial Assessment Questions 1. SYMPTOM: "What is the main symptom you are concerned about?" (e.g., weakness, numbness)     Numbness 2. ONSET: "When did this start?" (minutes, hours, days; while sleeping)     Thursday worse pain; going on [place] couple years 3. PATTERN "Does this come and go, or has it been constant since it started?"  "Is it present now?"     Constant 4. NEUROLOGIC SYMPTOMS: "Have you had any of the following symptoms: headache, dizziness, vision loss, double vision, changes in speech, unsteady on your feet?"     Headache  Protocols used: Neurologic Deficit-[facility]-AH

## 2023-12-09 NOTE — Telephone Encounter (Signed)
 Heather Sweeney was not available, but I did confirm what the referral was for and my number given if there are any additional questions

## 2023-12-10 NOTE — Telephone Encounter (Signed)
 Spoke to pt she went to [facility] and saw Dr Susy Frizzle on 12/09/23 has f/up with you on 12/15/23

## 2023-12-14 ENCOUNTER — Encounter: Payer: Self-pay | Admitting: Physical Medicine and Rehabilitation

## 2023-12-14 ENCOUNTER — Telehealth: Payer: Self-pay | Admitting: Physical Medicine and Rehabilitation

## 2023-12-14 ENCOUNTER — Encounter: Attending: Physical Medicine and Rehabilitation | Admitting: Physical Medicine and Rehabilitation

## 2023-12-14 VITALS — BP 130/81 | HR 81 | Ht 63.0 in | Wt 239.0 lb

## 2023-12-14 DIAGNOSIS — G4701 Insomnia due to medical condition: Secondary | ICD-10-CM | POA: Diagnosis not present

## 2023-12-14 DIAGNOSIS — Z79891 Long term (current) use of opiate analgesic: Secondary | ICD-10-CM | POA: Diagnosis not present

## 2023-12-14 DIAGNOSIS — M25569 Pain in unspecified knee: Secondary | ICD-10-CM | POA: Diagnosis not present

## 2023-12-14 DIAGNOSIS — R0683 Snoring: Secondary | ICD-10-CM | POA: Insufficient documentation

## 2023-12-14 DIAGNOSIS — M5451 Vertebrogenic low back pain: Secondary | ICD-10-CM | POA: Diagnosis not present

## 2023-12-14 DIAGNOSIS — Z5181 Encounter for therapeutic drug level monitoring: Secondary | ICD-10-CM | POA: Diagnosis not present

## 2023-12-14 DIAGNOSIS — M25562 Pain in left knee: Secondary | ICD-10-CM | POA: Insufficient documentation

## 2023-12-14 DIAGNOSIS — M47816 Spondylosis without myelopathy or radiculopathy, lumbar region: Secondary | ICD-10-CM | POA: Diagnosis not present

## 2023-12-14 DIAGNOSIS — G8929 Other chronic pain: Secondary | ICD-10-CM | POA: Insufficient documentation

## 2023-12-14 DIAGNOSIS — M25561 Pain in right knee: Secondary | ICD-10-CM | POA: Diagnosis not present

## 2023-12-14 DIAGNOSIS — Z6841 Body Mass Index (BMI) 40.0 and over, adult: Secondary | ICD-10-CM | POA: Insufficient documentation

## 2023-12-14 DIAGNOSIS — G894 Chronic pain syndrome: Secondary | ICD-10-CM | POA: Diagnosis not present

## 2023-12-14 DIAGNOSIS — G4730 Sleep apnea, unspecified: Secondary | ICD-10-CM | POA: Diagnosis not present

## 2023-12-14 DIAGNOSIS — M797 Fibromyalgia: Secondary | ICD-10-CM | POA: Diagnosis not present

## 2023-12-14 MED ORDER — DULOXETINE HCL 20 MG PO CPEP
20.0000 mg | ORAL_CAPSULE | Freq: Every day | ORAL | 3 refills | Status: DC
Start: 2023-12-14 — End: 2024-06-08

## 2023-12-14 NOTE — Progress Notes (Signed)
 Subjective:    Patient ID: Heather Sweeney, female    DOB: August 13, 1966, 58 y.o.   MRN: [id number]  HPI Mrs. Dede is a 58 year old woman who presents for bilateral knee pain.  1) Bilateral knee pain: -she has had steroid and viscosupplementation -she has thought about knee replacement but only if her goal is 220.  -lidocaine patch does not help -she has had pain for 7 years -she has tried muscle relaxers -she has had benefit from percocet in the past, she feels that she would benefit from using this up to 4 times per day -she has done PT and this made her pain worse -she is interested in being able to walk.  -voltaren was not helpful -she tried blue emu cream -her doctor told her she does not know why her her UDS shows a cocaine metabolite -she denies using cocaine  2) Obesity: She eats only once per day -she takes topamax at night  3) Insomnia: -wakes up at 2  Pain Inventory Average Pain 8 Pain Right Now 8 My pain is constant, burning, and aching  In the last 24 hours, has pain interfered with the following? General activity 9 Relation with others 0 Enjoyment of life 0 What TIME of day is your pain at its worst? morning , daytime, evening, and night Sleep (in general) Poor  Pain is worse with: bending and standing Pain improves with: rest Relief from Meds: no pain meds      Family History  Problem Relation Age of Onset   Heart failure Mother    Hypertension Mother    Early death Mother 37   Heart disease Mother 79       chf   Cancer Father 74       esophageal    Hypertension Father    Breast cancer Sister 29   Mental illness Sister    Heart failure Sister    Asthma Son    Cancer Paternal Aunt        brain cancer   Thyroid cancer Neg Hx    Social History   Socioeconomic History   Marital status: Married    Spouse name: Not on file   Number of children: Not on file   Years of education: Not on file   Highest education level: Not on file   Occupational History   Not on file  Tobacco Use   Smoking status: Former    Types: E-cigarettes   Smokeless tobacco: Current    Types: Chew   Tobacco comments:    She uses dip daily. She hasn't smoke cigarettes.  Vaping Use   Vaping status: Never Used  Substance and Sexual Activity   Alcohol use: Not Currently    Comment: last use 8 years ago   Drug use: Not Currently    Types: Marijuana    Comment: last use age 20   Sexual activity: Not Currently    Partners: Male    Birth control/protection: Surgical    Comment: Hysterectomy 2012  Other Topics Concern   Not on file  Social History Narrative   Married    No guns    Wears seat belt    Safe in relationship    Never smoker    2 sons Judie Grieve ~10 and another son ~28 as of 10/2019   1 granson eli 1 y.o as of 10/2019   2 sisters    Unemployed       Social Drivers of Health  Financial Resource Strain: Low Risk  (09/22/2023)   Overall Financial Resource Strain (CARDIA)    Difficulty of Paying Living Expenses: Not hard at all  Food Insecurity: No Food Insecurity (09/22/2023)   Hunger Vital Sign    Worried About Running Out of Food in the Last Year: Never true    Ran Out of Food in the Last Year: Never true  Transportation Needs: No Transportation Needs (09/22/2023)   PRAPARE - Administrator, Civil Service (Medical): No    Lack of Transportation (Non-Medical): No  Physical Activity: Inactive (09/22/2023)   Exercise Vital Sign    Days of Exercise per Week: 0 days    Minutes of Exercise per Session: 0 min  Stress: Stress Concern Present (09/22/2023)   [employer] of Occupational Health - Occupational Stress Questionnaire    Feeling of Stress : To some extent  Social Connections: Socially Integrated (09/22/2023)   Social Connection and Isolation Panel [NHANES]    Frequency of Communication with Friends and Family: More than three times a week    Frequency of Social Gatherings with Friends and Family: More  than three times a week    Attends Religious Services: More than 4 times per year    Active Member of Clubs or Organizations: Yes    Attends Banker Meetings: More than 4 times per year    Marital Status: Married   Past Surgical History:  Procedure Laterality Date   ABDOMINAL HYSTERECTOMY  07/08/2011   CARPAL TUNNEL RELEASE     right hand 1996   CESAREAN SECTION     2012   CHOLECYSTECTOMY N/A 01/12/2018   Procedure: LAPAROSCOPIC CHOLECYSTECTOMY WITH INTRAOPERATIVE CHOLANGIOGRAM ERAS PATHWAY;  Surgeon: Luretha Murphy, MD;  Location: [facility] ORS;  Service: General;  Laterality: N/A;   COLONOSCOPY WITH PROPOFOL N/A 04/15/2019   Procedure: COLONOSCOPY WITH PROPOFOL;  Surgeon: [name], MD;  Location: [facility] ENDOSCOPY;  Service: Gastroenterology;  Laterality: N/A;   DILATION AND CURETTAGE OF UTERUS     04/2011 benign endometrial polyp squamous metaplasia, inflamm, blood, mucous, benign    ESOPHAGOGASTRODUODENOSCOPY (EGD) WITH PROPOFOL N/A 10/23/2020   Procedure: ESOPHAGOGASTRODUODENOSCOPY (EGD) WITH PROPOFOL;  Surgeon: [name], MD;  Location: [facility];  Service: Gastroenterology;  Laterality: N/A;   ESOPHAGOGASTRODUODENOSCOPY (EGD) WITH PROPOFOL N/A 11/05/2023   Procedure: ESOPHAGOGASTRODUODENOSCOPY (EGD) WITH PROPOFOL;  Surgeon: [name], MD;  Location: [facility] ENDOSCOPY;  Service: Gastroenterology;  Laterality: N/A;   FLEXIBLE SIGMOIDOSCOPY N/A 02/15/2021   Procedure: FLEXIBLE SIGMOIDOSCOPY;  Surgeon: [name], MD;  Location: [facility] ENDOSCOPY;  Service: Gastroenterology;  Laterality: N/A;   FOOT SURGERY     Dr. Stacie Acres    LEEP     2006   PANNICULECTOMY N/A 04/24/2022   Procedure: INFRAUMBILICAL PANNICULECTOMY;  Surgeon: Peggye Form, DO;  Location: [facility] OR;  Service: Plastics;  Laterality: N/A;   Right hand carpal tunnel release Right 10/2022   scar tissue repair  04/2023   scar tissue from having children and tummy tuck   TUBAL LIGATION     Past Medical  History:  Diagnosis Date   Abnormal MRI, lumbar spine 02/12/2021   Abrasion of anterior left lower leg 07/10/2022   Allergic rhinitis    Annual physical exam 04/20/2020   Anxiety    Aperistalsis, esophagus 10/25/2020   Arthritis of knee, right    Arthritis of right knee 10/25/2020   Asthma    Bacterial vaginosis 10/20/2019   Bipolar disorder (HCC)    BMI 37.0-37.9, adult 03/07/2022  BMI 40.0-44.9, adult (HCC) 06/17/2022   Bronchiectasis without complication (HCC) 04/04/2022   Chronic low back pain    Chronic midline low back pain 12/17/2021   Class 3 severe obesity due to excess calories in adult [facility]) 04/09/2022   CTS (carpal tunnel syndrome)    hands   Depression    Diabetes mellitus without complication (HCC)    Early satiety 02/11/2022   Elevated liver enzymes 08/17/2020   Encounter for screening colonoscopy    Encounter for screening for COVID-19 10/10/2022   Frequent PVCs 09/24/2016   Gallstones    GERD (gastroesophageal reflux disease)    History of prediabetes    Hives 02/23/2019   Hypertension    Hypokalemia 12/13/2021   Incontinence of feces 02/12/2021   Ingrown right big toenail 04/20/2020   Iron deficiency anemia 12/13/2021   Laceration of right foot    Lymphedema 08/24/2022   Obesity (BMI 30-39.9) 09/10/2021   Onychomycosis    OSA on CPAP    does not use cpap   Osteoarthritis of knee 12/17/2021   Overactive bladder    Pain and swelling of right knee 04/20/2020   PVC's (premature ventricular contractions)    PVC's (premature ventricular contractions)    Dr. Kirtland Bouchard cards   S/P laparoscopic cholecystectomy April 2019 01/12/2018   Seizures (HCC)    8th grade   Tachycardia 10/10/2022   Urticaria 02/23/2019   Weight loss 02/11/2022   BP 130/81   Pulse 81   Ht 5\' 3"  (1.6 m)   Wt 239 lb (108.4 kg)   SpO2 97%   BMI 42.34 kg/m   Opioid Risk Score:   Fall Risk Score:  `1  Depression screen [facility] 2/9     12/14/2023   12:50 PM 11/30/2023    2:09 PM  11/27/2023   11:02 AM 10/23/2023    8:56 AM 09/22/2023    4:03 PM 09/22/2023   10:42 AM 07/02/2023   10:51 AM  Depression screen PHQ 2/9  Decreased Interest 0 0 0 1 0 0 1  Down, Depressed, Hopeless 0 0 0 2 0 0 1  PHQ - 2 Score 0 0 0 3 0 0 2  Altered sleeping  0  2 1  1   Tired, decreased energy  0  2 1  1   Change in appetite  0  2 3  3   Feeling bad or failure about yourself   0  2 1  1   Trouble concentrating  0  2 1  1   Moving slowly or fidgety/restless  0  2 0  1  Suicidal thoughts  0  0 0  0  PHQ-9 Score  0  15 7  10   Difficult doing work/chores  Not difficult at all  Somewhat difficult Very difficult  Not difficult at all      Review of Systems  Musculoskeletal:  Positive for back pain and gait problem.       B/L knee pain   All other systems reviewed and are negative.      Objective:   Physical Exam Gen: no distress, normal appearing HEENT: oral mucosa pink and moist, NCAT Cardio: Reg rate Chest: normal effort, normal rate of breathing Abd: soft, non-distended Ext: no edema Psych: pleasant, normal affect Skin: intact Neuro: Alert and oriented MSK: medial aspect of right knee is swollen, TTP bilateral knees      Assessment & Plan:   1) Bilateral knee pain -MRI right knee ordered previously -increase topamax to 50mg  HS -  PT ordered but this was painful for her, advised that she should not do exercises that are painful -recommended trial of topamax -Provided with a pain relief journal and discussed that it contains foods and lifestyle tips to naturally help to improve pain. Discussed that these lifestyle strategies are also very good for health unlike some medications which can have negative side effects. Discussed that the act of keeping a journal can be therapeutic and helpful to realize patterns what helps to trigger and alleviate pain.   -discussed voltaren gel -pain contact and urine sample performed today -will send percocet 10mg  QID prn if urine sample  contains expected metabolities -Discussed current symptoms of pain and history of pain.  -Discussed benefits of exercise in reducing pain. -Discussed following foods that may reduce pain: 1) Ginger (especially studied for arthritis)- reduce leukotriene production to decrease inflammation 2) Blueberries- high in phytonutrients that decrease inflammation 3) Salmon- marine omega-3s reduce joint swelling and pain 4) Pumpkin seeds- reduce inflammation 5) dark chocolate- reduces inflammation 6) turmeric- reduces inflammation 7) tart cherries - reduce pain and stiffness 8) extra virgin olive oil - its compound olecanthal helps to block prostaglandins  9) chili peppers- can be eaten or applied topically via capsaicin 10) mint- helpful for headache, muscle aches, joint pain, and itching 11) garlic- reduces inflammation  Link to further information on diet for chronic pain: [email or web address]   2) Obesity: -increase topamax to 50mg  HS  3)Insomnia: Increase topamax to 50mg  HS -Try to go outside near sunrise -Get exercise during the day.  -Turn off all devices an hour before bedtime.  -Teas that can benefit: chamomile, valerian root, Brahmi (Bacopa) -Can consider over the counter melatonin, magnesium, and/or L-theanine. Melatonin is an anti-oxidant with multiple health benefits. Magnesium is involved in greater than 300 enzymatic reactions in the body and most of [place] are deficient as our soil is often depleted. There are 7 different types of magnesium- Bioptemizer's is a supplement with all 7 types, and each has unique benefits. Magnesium can also help with constipation and anxiety.  -Pistachios naturally increase the production of melatonin -[employer]zy Earth bamboo bed sheets are free from toxic chemicals.  -Tart cherry juice or a tart cherry supplement can improve sleep and soreness post-workout  4) Snoring/sleep  apnea -recommended restarting CPAP

## 2023-12-14 NOTE — Patient Instructions (Addendum)
 GLP-1  Brassica   Foods that may reduce pain: 1) Ginger (especially studied for arthritis)- reduce leukotriene production to decrease inflammation 2) Blueberries- high in phytonutrients that decrease inflammation 3) Salmon- marine omega-3s reduce joint swelling and pain 4) Pumpkin seeds- reduce inflammation 5) dark chocolate- reduces inflammation 6) turmeric- reduces inflammation 7) tart cherries - reduce pain and stiffness 8) extra virgin olive oil - its compound olecanthal helps to block prostaglandins  9) chili peppers- can be eaten or applied topically via capsaicin 10) mint- helpful for headache, muscle aches, joint pain, and itching 11) garlic- reduces inflammation  Link to further information on diet for chronic pain: [email or web address]

## 2023-12-14 NOTE — Telephone Encounter (Signed)
 P called and wants a order for compression stockings sent to [facility] [address]

## 2023-12-15 ENCOUNTER — Encounter: Payer: Self-pay | Admitting: Family

## 2023-12-15 ENCOUNTER — Ambulatory Visit (INDEPENDENT_AMBULATORY_CARE_PROVIDER_SITE_OTHER): Admitting: Family

## 2023-12-15 VITALS — BP 128/84 | HR 78 | Temp 99.0°F | Ht 63.0 in | Wt 242.6 lb

## 2023-12-15 DIAGNOSIS — I890 Lymphedema, not elsewhere classified: Secondary | ICD-10-CM

## 2023-12-15 DIAGNOSIS — G4733 Obstructive sleep apnea (adult) (pediatric): Secondary | ICD-10-CM | POA: Diagnosis not present

## 2023-12-15 DIAGNOSIS — Z6841 Body Mass Index (BMI) 40.0 and over, adult: Secondary | ICD-10-CM

## 2023-12-15 DIAGNOSIS — Z87898 Personal history of other specified conditions: Secondary | ICD-10-CM | POA: Diagnosis not present

## 2023-12-15 NOTE — Patient Instructions (Addendum)
 We have to focus on weight loss.  No more sodas.  I believe in YOU!    Please download Myfitness Pal App ( basic version is free).   You may log every thing you eat for even 2-3 days to get a better of idea of total daily calories. To loose weight, we have to create caloric deficit to loose weight. The goal is 1-2 lbs per week of weight loss.   Excellent article below from [facility].   https://www.health.CriticalZ.it  Calorie counting made easy  Eat less, exercise more. If only it were that simple! As most dieters know, losing weight can be very challenging. As this report details, a range of influences can affect how people gain and lose weight. But a basic understanding of how to tip your energy balance in favor of weight loss is a good place to start.  Start by determining how many calories you should consume each day. To do so, you need to know how many calories you need to maintain your current weight. Doing this requires a few simple calculations.  First, multiply your current weight by 15 -- that's roughly the number of calories per pound of body weight needed to maintain your current weight if you are moderately active. Moderately active means getting at least 30 minutes of physical activity a day in the form of exercise (walking at a brisk pace, climbing stairs, or active gardening). Let's say you're a woman who is 5 feet, 4 inches tall and weighs 155 pounds, and you need to lose about 15 pounds to put you in a healthy weight range. If you multiply 155 by 15, you will get 2,325, which is the number of calories per day that you need in order to maintain your current weight (weight-maintenance calories). To lose weight, you will need to get below that total.  For example, to lose 1 to 2 pounds a week -- a rate that experts consider safe -- your food consumption should provide 500 to 1,000 calories less than your total weight-maintenance  calories. If you need 2,325 calories a day to maintain your current weight, reduce your daily calories to between 1,325 and 1,825. If you are sedentary, you will also need to build more activity into your day. In order to lose at least a pound a week, try to do at least 30 minutes of physical activity on most days, and reduce your daily calorie intake by at least 500 calories. However, calorie intake should not fall below 1,200 a day in women or 1,500 a day in men, except under the supervision of a health professional. Eating too few calories can endanger your health by depriving you of needed nutrients.  Meeting your calorie target How can you meet your daily calorie target? One approach is to add up the number of calories per serving of all the foods that you eat, and then plan your menus accordingly. You can buy books that list calories per serving for many foods. In addition, the nutrition labels on all packaged foods and beverages provide calories per serving information. Make a point of reading the labels of the foods and drinks you use, noting the number of calories and the serving sizes. Many recipes published in cookbooks, newspapers, and magazines provide similar information.  If you hate counting calories, a different approach is to restrict how much and how often you eat, and to eat meals that are low in calories. Dietary guidelines issued by the American Heart Association stress common  sense in choosing your foods rather than focusing strictly on numbers, such as total calories or calories from fat. Whichever method you choose, research shows that a regular eating schedule -- with meals and snacks planned for certain times each day -- makes for the most successful approach. The same applies after you have lost weight and want to keep it off. Sticking with an eating schedule increases your chance of maintaining your new weight.    This is  Dr. Melina Schools  ( an amazing physician in my office!)   example of a  "Low GI"  Diet:  It will allow you to lose 4 to 8  lbs  per month if you follow it carefully.  Your goal with exercise is a minimum of 30 minutes of aerobic exercise 5 days per week (Walking does not count once it becomes easy!)    All of the foods can be found at grocery stores and in bulk at [employer].  The Atkins protein bars and shakes are available in more varieties at Target, [employer] and [employer].     7 AM Breakfast:  Choose from the following:  Low carbohydrate Protein  Shakes (I recommend the  Premier Protein chocolate shakes,  EAS AdvantEdge "Carb Control" shakes  Or the Atkins shakes all are under 3 net carbs)     a scrambled egg/bacon/cheese burrito made with Mission's "carb balance" whole wheat tortilla  (about 10 net carbs )  Medical laboratory scientific officer (basically a quiche without the pastry crust) that is eaten cold and very convenient way to get your eggs.  8 carbs)  If you make your own protein shakes, avoid bananas and pineapple,  And use low carb greek yogurt or original /unsweetened almond or soy milk    Avoid cereal and bananas, oatmeal and cream of wheat and grits. They are loaded with carbohydrates!   10 AM: high protein snack:  Protein bar by Atkins (the snack size, under 200 cal, usually < 6 net carbs).    A stick of cheese:  Around 1 carb,  100 cal     Dannon Light n Fit [place] Yogurt  (80 cal, 8 carbs)  Other so called "protein bars" and Greek yogurts tend to be loaded with carbohydrates.  Remember, in food advertising, the word "energy" is synonymous for " carbohydrate."  Lunch:   A Sandwich using the bread choices listed, Can use any  Eggs,  lunchmeat, grilled meat or canned tuna), avocado, regular mayo/mustard  and cheese.  A Salad using blue cheese, ranch,  Goddess or vinagrette,  Avoid taco shells, croutons or "confetti" and no "candied nuts" but regular nuts OK.   No pretzels, nabs  or chips.  Pickles and miniature sweet peppers are  a good low carb alternative that provide a "crunch"  The bread is the only source of carbohydrate in a sandwich and  can be decreased by trying some of the attached alternatives to traditional loaf bread   Avoid "Low fat dressings, as well as Reyne Dumas and [employer] dressings They are loaded with sugar!   3 PM/ Mid day  Snack:  Consider  1 ounce of  almonds, walnuts, pistachios, pecans, peanuts,  Macadamia nuts or a nut medley.  Avoid "granola and granola bars "  Mixed nuts are ok in moderation as long as there are no raisins,  cranberries or dried fruit.   KIND bars are OK if you get the low glycemic index variety   Try  the prosciutto/mozzarella cheese sticks by Fiorruci  In deli /backery section   High protein      6 PM  Dinner:     Meat/fowl/fish with a green salad, and either broccoli, cauliflower, green beans, spinach, brussel sprouts or  Lima beans. DO NOT BREAD THE PROTEIN!!      There is a low carb pasta by Dreamfield's that is acceptable and tastes great: only 5 digestible carbs/serving.( All grocery stores but BJs carry it ) Several ready made meals are available low carb:   Try Michel Angelo's chicken piccata or chicken or eggplant parm over low carb pasta.(Lowes and BJs)   Clifton Custard Sanchez's "Carnitas" (pulled pork, no sauce,  0 carbs) or his beef pot roast to make a dinner burrito (at [employer]J's)  Pesto over low carb pasta (bj's sells a good quality pesto in the center refrigerated section of the deli   Try satueeing  Roosvelt Harps with mushroooms as a good side   [employer] Giant makes a mashed cauliflower that tastes like mashed potatoes  Whole wheat pasta is still full of digestible carbs and  Not as low in glycemic index as Dreamfield's.   Brown rice is still rice,  So skip the rice and noodles if you eat [place] or [place] (or at least limit to 1/2 cup)  9 PM snack :   Breyer's "low carb" fudgsicle or  ice cream bar (Carb Smart line), or  Weight Watcher's ice cream bar , or another "no  sugar added" ice cream;  a serving of fresh berries/cherries with whipped cream   Cheese or DANNON'S LlGHT N FIT GREEK YOGURT  8 ounces of [place] unsweetened almond/cococunut milk    Treat yourself to a parfait made with whipped cream blueberiies, walnuts and vanilla greek yogurt  Avoid bananas, pineapple, grapes  and watermelon on a regular basis because they are high in sugar.  THINK OF THEM AS DESSERT  Remember that snack Substitutions should be less than 10 NET carbs per serving and meals < 20 carbs. Remember to subtract fiber grams to get the "net carbs."

## 2023-12-15 NOTE — Assessment & Plan Note (Signed)
 Chronic, uncontrolled.  Discussed risks of untreated sleep apnea.  Referral in place for pulmonology.  Discussed weight loss

## 2023-12-15 NOTE — Progress Notes (Signed)
 Assessment & Plan:  Lymphedema Assessment & Plan: On exam, no edema appreciated.  Exam consistent with patient's body habitus, obesity.  Provided prescription for compression stockings as lymphedema pump caused pain.  Orders: -     For home use only DME Other see comment  History of prediabetes -     Amb ref to Medical Nutrition Therapy-MNT  Morbid obesity with BMI of 40.0-44.9, adult [facility]) Assessment & Plan: Discussed dietary indiscretion.  Discussed low glycemic diet, creating caloric deficit.  Referral to nutritionist .consider increasing Ozempic 0.5mg . She is compliant with topamax 25mg  as prescribed by physical medicine.   Orders: -     Amb ref to Medical Nutrition Therapy-MNT  OSA on CPAP Assessment & Plan: Chronic, uncontrolled.  Discussed risks of untreated sleep apnea.  Referral in place for pulmonology.  Discussed weight loss      Return precautions given.   Risks, benefits, and alternatives of the medications and treatment plan prescribed today were discussed, and patient expressed understanding.   Education regarding symptom management and diagnosis given to patient on AVS either electronically or printed.  Return in about 2 months (around 02/14/2024).  Rennie Plowman, FNP  Subjective:    Patient ID: Heather Sweeney, female    DOB: 1966/02/14, 58 y.o.   MRN: [id number]  CC: Heather Sweeney is a 58 y.o. female who presents today for follow up.   HPI: Follow-up   Complains of chronic bilateral leg swelling, unchanged.  She request prescription for new compression stockings.  She is no longer using lymphedema pump as it caused leg pain.  She endorses dietary discretion drinking 2 sodas per day.  She has gained weight . she would like to lose weight and so that she may have a right knee replacement  Compliant with Ozempic 0.5 mg daily  She is currently not wearing her CPAP.  She has not for couple years  No cp, sob, orthopnea   Follow-up yesterday with  physical medicine, Dr Carlis Abbott started Cymbalta 30 mg and increased  Topamax  Seen by vascular 10/08/2023 for follow-up lymphedema, recommended lymphedema pump.  Borderline normal systolic LV function on echocardiogram 10/25/2017.  Allergies: Hydrochlorothiazide, Keflex [cephalexin], Lisinopril, Losartan, Penicillins, Bee venom, Metformin, and Metformin and related Current Outpatient Medications on File Prior to Visit  Medication Sig Dispense Refill   ACCU-CHEK GUIDE test strip Use as instructed 100 strip 3   albuterol (VENTOLIN HFA) 108 (90 Base) MCG/ACT inhaler Inhale 1-2 puffs into the lungs every 6 (six) hours as needed for wheezing or shortness of breath. 18 g 11   amLODipine (NORVASC) 5 MG tablet Take 1 tablet (5 mg total) by mouth daily. 90 tablet 3   benztropine (COGENTIN) 1 MG tablet Take 1 mg by mouth 2 (two) times daily.     Blood Glucose Monitoring Suppl (ACCU-CHEK GUIDE ME) w/Device KIT Use as directed,once daily to test blood sugar 1 kit 0   buprenorphine (BUTRANS) 10 MCG/HR PTWK Place 10 patches onto the skin once a week.     buPROPion (WELLBUTRIN XL) 150 MG 24 hr tablet Take 300 mg by mouth every morning.     busPIRone (BUSPAR) 15 MG tablet Take 15 mg by mouth 2 (two) times daily.     DULoxetine (CYMBALTA) 20 MG capsule Take 1 capsule (20 mg total) by mouth daily. 90 capsule 3   EPINEPHrine 0.3 mg/0.3 mL IJ SOAJ injection Inject 0.3 mg into the muscle as needed for anaphylaxis. 2 each 3   famotidine (  PEPCID) 40 MG tablet Take 1 tablet (40 mg total) by mouth daily. 90 tablet 3   FLUoxetine (PROZAC) 40 MG capsule Take 40 mg by mouth daily.     fluPHENAZine (PROLIXIN) 5 MG tablet Take 5 mg by mouth 2 (two) times daily.     fluticasone (FLONASE) 50 MCG/ACT nasal spray Place 2 sprays into both nostrils daily. 48 g 3   hydrOXYzine (ATARAX) 50 MG tablet Take 50 mg by mouth 2 (two) times daily as needed for anxiety.     Insulin Pen Needle (NOVOFINE) 30G X 8 MM MISC Use with victoza daily  1.8 mg 90 each 3   metoprolol succinate (TOPROL-XL) 25 MG 24 hr tablet Take 25 mg by mouth daily.     Multiple Vitamins-Minerals (ALIVE MULTI-VITAMIN PO) Take by mouth daily in the afternoon.     nicotine (NICODERM CQ - DOSED IN MG/24 HOURS) 21 mg/24hr patch Place 1 patch (21 mg total) onto the skin daily. For 6 weeks, then call office for 14mg /24 hr nicotine patch. 28 patch 2   nicotine polacrilex (NICORETTE) 4 MG gum Take 1 each (4 mg total) by mouth as needed for smoking cessation. 20 tablet 1   omalizumab (XOLAIR) 150 MG/ML prefilled syringe      omeprazole (PRILOSEC) 40 MG capsule Take 1 capsule (40 mg total) by mouth 2 times daily at 12 noon and 4 pm. 120 capsule 0   scopolamine (TRANSDERM-SCOP) 1 MG/3DAYS Place 1 patch (1.5 mg total) onto the skin every 3 (three) days. 4 hours before you get on boat 4 patch 0   Semaglutide,0.25 or 0.5MG /DOS, 2 MG/3ML SOPN Inject 0.5 mg into the skin once a week.     topiramate (TOPAMAX) 25 MG tablet Take 1 tablet (25 mg total) by mouth at bedtime. 90 tablet 3   vitamin B-12 (CYANOCOBALAMIN) 1000 MCG tablet Take 1,000 mcg by mouth daily.     No current facility-administered medications on file prior to visit.    Review of Systems  Constitutional:  Negative for chills and fever.  Respiratory:  Negative for cough and shortness of breath.   Cardiovascular:  Positive for leg swelling. Negative for chest pain and palpitations.  Gastrointestinal:  Negative for nausea and vomiting.  Musculoskeletal:  Positive for arthralgias.      Objective:    BP 128/84   Pulse 78   Temp 99 F (37.2 C) (Oral)   Ht 5\' 3"  (1.6 m)   Wt 242 lb 9.6 oz (110 kg)   SpO2 97%   BMI 42.97 kg/m  BP Readings from Last 3 Encounters:  12/15/23 128/84  12/14/23 130/81  11/30/23 126/76   Wt Readings from Last 3 Encounters:  12/15/23 242 lb 9.6 oz (110 kg)  12/14/23 239 lb (108.4 kg)  11/30/23 233 lb 9.6 oz (106 kg)    Physical Exam Vitals reviewed.  Constitutional:       Appearance: She is well-developed.  Eyes:     Conjunctiva/sclera: Conjunctivae normal.  Cardiovascular:     Rate and Rhythm: Normal rate and regular rhythm.     Pulses: Normal pulses.     Heart sounds: Normal heart sounds.     Comments: No LE edema, palpable cords or masses.   No erythema or increased warmth. No asymmetry in calf size when compared bilaterally LE hair growth symmetric and present. No discoloration or varicosities noted. LE warm and palpable pedal pulses.  Pulmonary:     Effort: Pulmonary effort is normal.  Breath sounds: Normal breath sounds. No wheezing, rhonchi or rales.  Skin:    General: Skin is warm and dry.  Neurological:     Mental Status: She is alert.  Psychiatric:        Speech: Speech normal.        Behavior: Behavior normal.        Thought Content: Thought content normal.

## 2023-12-15 NOTE — Assessment & Plan Note (Addendum)
 Discussed dietary indiscretion.  Discussed low glycemic diet, creating caloric deficit.  Referral to nutritionist .consider increasing Ozempic 0.5mg . She is compliant with topamax 25mg  as prescribed by physical medicine.

## 2023-12-15 NOTE — Telephone Encounter (Signed)
 Mail box is full. MyChart reply has been sent.

## 2023-12-15 NOTE — Assessment & Plan Note (Signed)
 On exam, no edema appreciated.  Exam consistent with patient's body habitus, obesity.  Provided prescription for compression stockings as lymphedema pump caused pain.

## 2023-12-15 NOTE — Unmapped (Signed)
 Specialty Medication(s): Xolair    Renee Cunningham has been dis-enrolled from the [facility] specialty pharmacy services as a result of multiple unsuccessful outreach attempts by the pharmacy.    Additional information provided to the patient: n/a    [name], PharmD  [facility] Specialty Pharmacist

## 2023-12-21 ENCOUNTER — Ambulatory Visit: Payer: 59 | Admitting: Family

## 2023-12-22 ENCOUNTER — Encounter: Payer: 59 | Admitting: Registered Nurse

## 2023-12-24 ENCOUNTER — Other Ambulatory Visit: Payer: Self-pay | Admitting: Family Medicine

## 2023-12-24 DIAGNOSIS — J309 Allergic rhinitis, unspecified: Secondary | ICD-10-CM

## 2023-12-29 ENCOUNTER — Telehealth: Payer: Self-pay

## 2023-12-29 NOTE — Telephone Encounter (Signed)
 Tried reaching patient via phone----sent the following message through my chart-active status---faxed office note, insurance form, imaging and completed [facility] referral form today to [phone]  Good morning,  I tried calling but your mailbox is full and I was unable to leave a message-  Please call the office when you have a time-[phone]- Dr.Anna would like for you to have additional testing done due to the results of the recent EGD done on 11/05/23. Test is called esophageal manometry and is performed at [facility] - I have sent referral to [facility] so someone will be reaching out to schedule an appointment- their office number is [phone]-please call them if you have not heard from them in about 2 weeks - esophageal manometry is a test that measures the pressure and contractions of the esophagus- this test is used to diagnose conditions that affect esophageal function such as gerd and esophageal spasms and difficulty swallowing. Thank you.

## 2023-12-30 DIAGNOSIS — R1310 Dysphagia, unspecified: Principal | ICD-10-CM

## 2023-12-31 ENCOUNTER — Telehealth: Payer: Self-pay

## 2023-12-31 NOTE — Telephone Encounter (Signed)
 Spoke with Heather Sweeney at [facility] -patient is scheduled 01/01/24 at 12:30.

## 2024-01-01 ENCOUNTER — Inpatient Hospital Stay: Admit: 2024-01-01 | Discharge: 2024-01-01 | Payer: MEDICARE

## 2024-01-01 DIAGNOSIS — K2289 Other specified disease of esophagus: Secondary | ICD-10-CM | POA: Diagnosis not present

## 2024-01-01 DIAGNOSIS — R1314 Dysphagia, pharyngoesophageal phase: Secondary | ICD-10-CM | POA: Diagnosis not present

## 2024-01-06 ENCOUNTER — Ambulatory Visit (INDEPENDENT_AMBULATORY_CARE_PROVIDER_SITE_OTHER): Admitting: Sleep Medicine

## 2024-01-06 DIAGNOSIS — G4733 Obstructive sleep apnea (adult) (pediatric): Secondary | ICD-10-CM

## 2024-01-07 ENCOUNTER — Encounter: Payer: Self-pay | Admitting: Sleep Medicine

## 2024-01-07 ENCOUNTER — Ambulatory Visit (INDEPENDENT_AMBULATORY_CARE_PROVIDER_SITE_OTHER): Admitting: Sleep Medicine

## 2024-01-07 VITALS — BP 122/70 | HR 77 | Temp 96.9°F | Ht 63.0 in | Wt 227.2 lb

## 2024-01-07 DIAGNOSIS — G4733 Obstructive sleep apnea (adult) (pediatric): Secondary | ICD-10-CM

## 2024-01-07 DIAGNOSIS — I10 Essential (primary) hypertension: Secondary | ICD-10-CM | POA: Diagnosis not present

## 2024-01-07 NOTE — Progress Notes (Signed)
 Name:Heather Sweeney MRN: [id number] DOB: 1966/06/26   CHIEF COMPLAINT:  REASSESSMENT OF OSA   HISTORY OF PRESENT ILLNESS:  Heather Sweeney is a 58 y.o. w/ a h/o OSA, HTN, GERD, anxiety, depression and morbid obesity who presents for reassessment of OSA. Reports that she was initially diagnosed with OSA several years ago and subsequently started on CPAP therapy. States that she used CPAP therapy for a few brief period and discontinued use due to unclear reasons. Reports a 60 lb weight loss over the last few years with diet and lifestyle modification.   Reports c/o loud snoring, witnessed apnea and excessive daytime sleepiness. Reports nocturnal awakenings due to unclear reasons and has difficulty falling back to sleep. Admits to night sweats, dry mouth and morning headaches. Denies RLS symptoms, dream enactment, cataplexy, hypnagogic or hypnapompic hallucinations. Reports a family history of sleep apnea. Reports drowsy driving. Drinks 4 sodas daily, denies alcohol, or illicit drug use. Reports using tobacco snuff a few times per week.   Bedtime 9 pm Sleep onset 10 mins Rise time 7:30 am   EPWORTH SLEEP SCORE 22     No data to display          PAST MEDICAL HISTORY :   has a past medical history of Abnormal MRI, lumbar spine (02/12/2021), Abrasion of anterior left lower leg (07/10/2022), Allergic rhinitis, Annual physical exam (04/20/2020), Anxiety, Aperistalsis, esophagus (10/25/2020), Arthritis of knee, right, Arthritis of right knee (10/25/2020), Asthma, Bacterial vaginosis (10/20/2019), Bipolar disorder (HCC), BMI 37.0-37.9, adult (03/07/2022), BMI 40.0-44.9, adult (HCC) (06/17/2022), Bronchiectasis without complication (HCC) (04/04/2022), Chronic low back pain, Chronic midline low back pain (12/17/2021), Class 3 severe obesity due to excess calories in adult [facility]) (04/09/2022), CTS (carpal tunnel syndrome), Depression, Diabetes mellitus without complication (HCC), Early satiety  (02/11/2022), Elevated liver enzymes (08/17/2020), Encounter for screening colonoscopy, Encounter for screening for COVID-19 (10/10/2022), Frequent PVCs (09/24/2016), Gallstones, GERD (gastroesophageal reflux disease), History of prediabetes, Hives (02/23/2019), Hypertension, Hypokalemia (12/13/2021), Incontinence of feces (02/12/2021), Ingrown right big toenail (04/20/2020), Iron deficiency anemia (12/13/2021), Laceration of right foot, Lymphedema (08/24/2022), Obesity (BMI 30-39.9) (09/10/2021), Onychomycosis, OSA on CPAP, Osteoarthritis of knee (12/17/2021), Overactive bladder, Pain and swelling of right knee (04/20/2020), PVC's (premature ventricular contractions), PVC's (premature ventricular contractions), S/P laparoscopic cholecystectomy April 2019 (01/12/2018), Seizures (HCC), Tachycardia (10/10/2022), Urticaria (02/23/2019), and Weight loss (02/11/2022).  has a past surgical history that includes Abdominal hysterectomy (07/08/2011); Cholecystectomy (N/A, 01/12/2018); Foot surgery; Cesarean section; Dilation and curettage of uterus; LEEP; Tubal ligation; Carpal tunnel release; Colonoscopy with propofol (N/A, 04/15/2019); Esophagogastroduodenoscopy (egd) with propofol (N/A, 10/23/2020); Flexible sigmoidoscopy (N/A, 02/15/2021); Panniculectomy (N/A, 04/24/2022); Right hand carpal tunnel release (Right, 10/2022); scar tissue repair (04/2023); and Esophagogastroduodenoscopy (egd) with propofol (N/A, 11/05/2023). Prior to Admission medications   Medication Sig Start Date End Date Taking? Authorizing Provider  ACCU-CHEK GUIDE test strip Use as instructed 06/07/22   Eulis Foster, FNP  albuterol (VENTOLIN HFA) 108 (90 Base) MCG/ACT inhaler Inhale 1-2 puffs into the lungs every 6 (six) hours as needed for wheezing or shortness of breath. 04/04/22   McLean-Scocuzza, Pasty Spillers, MD  amLODipine (NORVASC) 5 MG tablet Take 1 tablet (5 mg total) by mouth daily. 12/22/22   Dana Allan, MD  benztropine (COGENTIN) 1 MG  tablet Take 1 mg by mouth 2 (two) times daily. 08/10/20   [provider]  Blood Glucose Monitoring Suppl (ACCU-CHEK GUIDE ME) w/Device KIT Use as directed,once daily to test blood sugar 05/22/22   Worthy Rancher  B, FNP  buprenorphine (BUTRANS) 10 MCG/HR PTWK Place 10 patches onto the skin once a week.    [provider]  buPROPion (WELLBUTRIN XL) 150 MG 24 hr tablet Take 300 mg by mouth every morning. 09/02/21   [provider]  busPIRone (BUSPAR) 15 MG tablet Take 15 mg by mouth 2 (two) times daily. 11/26/22   [provider]  DULoxetine (CYMBALTA) 20 MG capsule Take 1 capsule (20 mg total) by mouth daily. 12/14/23   Raulkar, Drema Pry, MD  EPINEPHrine 0.3 mg/0.3 mL IJ SOAJ injection Inject 0.3 mg into the muscle as needed for anaphylaxis. 03/07/22   McLean-Scocuzza, Pasty Spillers, MD  famotidine (PEPCID) 40 MG tablet Take 1 tablet (40 mg total) by mouth daily. 12/22/22   Dana Allan, MD  FLUoxetine (PROZAC) 40 MG capsule Take 40 mg by mouth daily. 07/15/20   [provider]  fluPHENAZine (PROLIXIN) 5 MG tablet Take 5 mg by mouth 2 (two) times daily. 11/14/22   [provider]  fluticasone Aleda Grana) 50 MCG/ACT nasal spray INSTILL 2 SPRAYS IN EACH NOSTRIL ONCE DAILY 12/25/23   Dana Allan, MD  hydrOXYzine (ATARAX) 50 MG tablet Take 50 mg by mouth 2 (two) times daily as needed for anxiety. 03/10/22   [provider]  Insulin Pen Needle (NOVOFINE) 30G X 8 MM MISC Use with victoza daily 1.8 mg 12/22/22   Dana Allan, MD  metoprolol succinate (TOPROL-XL) 25 MG 24 hr tablet Take 25 mg by mouth daily. 12/10/22   [provider]  Multiple Vitamins-Minerals (ALIVE MULTI-VITAMIN PO) Take by mouth daily in the afternoon.    [provider]  nicotine (NICODERM CQ - DOSED IN MG/24 HOURS) 21 mg/24hr patch Place 1 patch (21 mg total) onto the skin daily. For 6 weeks, then call office for 14mg /24 hr nicotine patch. 10/05/23   Allegra Grana, FNP   nicotine polacrilex (NICORETTE) 4 MG gum Take 1 each (4 mg total) by mouth as needed for smoking cessation. 10/05/23   Allegra Grana, FNP  omalizumab Geoffry Paradise) 150 MG/ML prefilled syringe  07/10/22   [provider]  omeprazole (PRILOSEC) 40 MG capsule Take 1 capsule (40 mg total) by mouth 2 times daily at 12 noon and 4 pm. 09/21/23   [name], MD  scopolamine (TRANSDERM-SCOP) 1 MG/3DAYS Place 1 patch (1.5 mg total) onto the skin every 3 (three) days. 4 hours before you get on boat 03/07/22   McLean-Scocuzza, Pasty Spillers, MD  Semaglutide,0.25 or 0.5MG /DOS, 2 MG/3ML SOPN Inject 0.5 mg into the skin once a week. 09/22/23   Allegra Grana, FNP  topiramate (TOPAMAX) 25 MG tablet Take 1 tablet (25 mg total) by mouth at bedtime. 11/27/23   Raulkar, Drema Pry, MD  vitamin B-12 (CYANOCOBALAMIN) 1000 MCG tablet Take 1,000 mcg by mouth daily.    [provider]   Allergies  Allergen Reactions   Hydrochlorothiazide Rash and Swelling    hydrochlorothiazide   Keflex [Cephalexin] Swelling and Rash   Lisinopril Hives and Other (See Comments)    Headache  Low BP  Angioedema  Other Reaction(s): other  lisinopril   Losartan Hives and Swelling    Angioedema  losartan   Penicillins Rash    Has patient had a PCN reaction causing immediate rash, facial/tongue/throat swelling, SOB or lightheadedness with hypotension: Yes Has patient had a PCN reaction causing severe rash involving mucus membranes or skin necrosis: No Has patient had a PCN reaction that required hospitalization: No Has patient had  a PCN reaction occurring within the last 10 years: No If all of the above answers are "NO", then may proceed with Cephalosporin use.    Bee Venom Swelling    Other Reaction(s): other  honey bee venom   Metformin     Other Reaction(s): other  metformin   Metformin And Related Rash    FAMILY HISTORY:  family history includes Asthma in her son; Breast cancer (age of onset: 71) in  her sister; Cancer in her paternal aunt; Cancer (age of onset: 75) in her father; Early death (age of onset: 9) in her mother; Heart disease (age of onset: 86) in her mother; Heart failure in her mother and sister; Hypertension in her father and mother; Mental illness in her sister. SOCIAL HISTORY:  reports that she has quit smoking. Her smoking use included e-cigarettes. Her smokeless tobacco use includes chew. She reports that she does not currently use alcohol. She reports that she does not currently use drugs after having used the following drugs: Marijuana.   Review of Systems:  Gen:  Denies  fever, sweats, chills weight loss  HEENT: Denies blurred vision, double vision, ear pain, eye pain, hearing loss, nose bleeds, sore throat Cardiac:  No dizziness, chest pain or heaviness, chest tightness,edema, No JVD Resp:   No cough, -sputum production, -shortness of breath,-wheezing, -hemoptysis,  Gi: Denies swallowing difficulty, stomach pain, nausea or vomiting, diarrhea, constipation, bowel incontinence Gu:  Denies bladder incontinence, burning urine Ext:   Denies Joint pain, stiffness or swelling Skin: Denies  skin rash, easy bruising or bleeding or hives Endoc:  Denies polyuria, polydipsia , polyphagia or weight change Psych:   Denies depression, insomnia or hallucinations  Other:  All other systems negative  VITAL SIGNS: BP 122/70 (BP Location: Right Arm, Cuff Size: Large)   Pulse 77   Temp (!) 96.9 F (36.1 C)   Ht 5\' 3"  (1.6 m)   Wt 227 lb 3.2 oz (103.1 kg)   SpO2 98%   BMI 40.25 kg/m     Physical Examination:   General Appearance: No distress  EYES PERRLA, EOM intact.   NECK Supple, No JVD Pulmonary: normal breath sounds, No wheezing.  CardiovascularNormal S1,S2.  No m/r/g.   Abdomen: Benign, Soft, non-tender. Skin:   warm, no rashes, no ecchymosis  Extremities: normal, no cyanosis, clubbing. Neuro:without focal findings,  speech normal  PSYCHIATRIC: Mood, affect  within normal limits.   ASSESSMENT AND PLAN  OSA I suspect that OSA is likely still present due to clinical presentation. I also suspect that hypersomnia is secondary to Discussed the consequences of untreated sleep apnea. Advised not to drive drowsy for safety of patient and others. Will complete further evaluation with a home sleep study and follow up to review results.    HTN Stable, on current management. Following with PCP.   Morbid obesity Counseled patient on diet and lifestyle modification.   MEDICATION ADJUSTMENTS/LABS AND TESTS ORDERED: Recommend Sleep Study   Patient satisfied with Plan of action and management. All questions answered  Follow up to review sleep night study results and treatment plan.   I spent a total of 46 minutes reviewing chart data, face-to-face evaluation with the patient, counseling and coordination of care as detailed above.    Tempie Hoist, M.D.  Sleep Medicine [facility]

## 2024-01-07 NOTE — Patient Instructions (Signed)
 Marland Kitchen

## 2024-01-11 ENCOUNTER — Other Ambulatory Visit: Payer: Self-pay

## 2024-01-11 DIAGNOSIS — K224 Dyskinesia of esophagus: Secondary | ICD-10-CM

## 2024-01-11 DIAGNOSIS — R1110 Vomiting, unspecified: Secondary | ICD-10-CM

## 2024-01-11 DIAGNOSIS — R1319 Other dysphagia: Secondary | ICD-10-CM

## 2024-01-19 ENCOUNTER — Ambulatory Visit: Attending: Otolaryngology

## 2024-01-19 DIAGNOSIS — G4752 REM sleep behavior disorder: Secondary | ICD-10-CM | POA: Diagnosis not present

## 2024-01-19 DIAGNOSIS — M13861 Other specified arthritis, right knee: Secondary | ICD-10-CM | POA: Diagnosis not present

## 2024-01-19 DIAGNOSIS — Z6841 Body Mass Index (BMI) 40.0 and over, adult: Secondary | ICD-10-CM | POA: Insufficient documentation

## 2024-01-19 DIAGNOSIS — G4733 Obstructive sleep apnea (adult) (pediatric): Secondary | ICD-10-CM | POA: Diagnosis not present

## 2024-01-19 DIAGNOSIS — R0683 Snoring: Secondary | ICD-10-CM | POA: Insufficient documentation

## 2024-01-19 DIAGNOSIS — E669 Obesity, unspecified: Secondary | ICD-10-CM | POA: Diagnosis not present

## 2024-01-20 NOTE — Progress Notes (Signed)
 Patient cancelled

## 2024-01-21 ENCOUNTER — Ambulatory Visit: Admitting: Dietician

## 2024-01-22 ENCOUNTER — Other Ambulatory Visit: Payer: Self-pay | Admitting: Family Medicine

## 2024-01-22 DIAGNOSIS — I10 Essential (primary) hypertension: Secondary | ICD-10-CM

## 2024-01-26 ENCOUNTER — Telehealth: Payer: Self-pay

## 2024-01-26 DIAGNOSIS — G4733 Obstructive sleep apnea (adult) (pediatric): Secondary | ICD-10-CM | POA: Diagnosis not present

## 2024-01-26 NOTE — Telephone Encounter (Signed)
 Called pt was unable to LVM vm was full

## 2024-01-26 NOTE — Telephone Encounter (Signed)
 Copied from CRM [id number]. Topic: General - Other >> Jan 26, 2024 12:32 PM Aisha D wrote: Reason for CRM: Patient stated that she needs a doctors note for driving with tented windows and also a request for pressure stockings. Patient would like someone from the office reach out regarding these concerns.

## 2024-01-27 ENCOUNTER — Telehealth (INDEPENDENT_AMBULATORY_CARE_PROVIDER_SITE_OTHER): Payer: Self-pay | Admitting: Sleep Medicine

## 2024-01-27 ENCOUNTER — Ambulatory Visit (INDEPENDENT_AMBULATORY_CARE_PROVIDER_SITE_OTHER): Admitting: Family Medicine

## 2024-01-27 ENCOUNTER — Encounter: Payer: Self-pay | Admitting: Family Medicine

## 2024-01-27 VITALS — BP 132/62 | HR 85 | Temp 98.4°F | Resp 20 | Ht 63.0 in | Wt 230.2 lb

## 2024-01-27 DIAGNOSIS — G8929 Other chronic pain: Secondary | ICD-10-CM

## 2024-01-27 DIAGNOSIS — I890 Lymphedema, not elsewhere classified: Secondary | ICD-10-CM | POA: Diagnosis not present

## 2024-01-27 DIAGNOSIS — G4733 Obstructive sleep apnea (adult) (pediatric): Secondary | ICD-10-CM

## 2024-01-27 DIAGNOSIS — Z1231 Encounter for screening mammogram for malignant neoplasm of breast: Secondary | ICD-10-CM

## 2024-01-27 DIAGNOSIS — M170 Bilateral primary osteoarthritis of knee: Secondary | ICD-10-CM | POA: Diagnosis not present

## 2024-01-27 NOTE — Patient Instructions (Addendum)
 It was a pleasure meeting you today. Thank you for allowing me to take part in your health care.  Our goals for today as we discussed include:  Recommend follow up with Ortho for knee pain.  Can discuss if needing surgical intervention or physical therapy.  Recommend follow up with Pain management to discuss Buprenorphine patch irritation. Can continue to discuss options for pain management.  Follow up with PCP for discussion of letter for [facility].     This is a list of the screening recommended for you and due dates:  Health Maintenance  Topic Date Due   Pneumococcal Vaccination (3 of 3 - PPSV23, PCV20 or PCV21) 11/05/2021   COVID-19 Vaccine (4 - 2024-25 season) 06/07/2023   Mammogram  12/19/2023   Flu Shot  05/06/2024   Medicare Annual Wellness Visit  09/21/2024   Colon Cancer Screening  04/14/2029   DTaP/Tdap/Td vaccine (4 - Td or Tdap) 11/25/2031   Hepatitis C Screening  Completed   HIV Screening  Completed   Zoster (Shingles) Vaccine  Completed   HPV Vaccine  Aged Out   Meningitis B Vaccine  Aged Out    If you have any questions or concerns, please do not hesitate to call the office at [phone].  I look forward to our next visit and until then take care and stay safe.  Regards,   Valli Gaw, MD   [facility]

## 2024-01-27 NOTE — Progress Notes (Unsigned)
 SUBJECTIVE:   Chief Complaint  Patient presents with   Neck Pain    Since 2017 but getting worse.   Back Pain    Since 2017 getting worse   HPI Presents for acute visit  Discussed the use of AI scribe software for clinical note transcription with the patient, who gave verbal consent to proceed.  History of Present Illness Heather Sweeney is a 58 year old female who presents with back and knee pain, and esophageal issues.  She has ongoing issues with her knee and back, with severe back pain described as a 'catch' that causes her to straighten up suddenly. She has been diagnosed with spinal stenosis and was referred to a pain management clinic. She recently received an injection for her back pain, but it has not provided relief. She is currently using a buprenorphine patch for pain management, which causes itching. She previously took Percocet but was switched to the patch. Physical therapy was attempted, but her sessions have ended.  She describes esophageal issues, taking medication to help with swallowing but experiencing vomiting. A procedure and a swallowing study revealed a blockage in her esophagus. She is awaiting further results from a recent test.  She experiences weakness in her knees, affecting her balance and causing her to trip. She uses a cane and sometimes a walker for mobility. Surgical intervention for her knee issues has been advised, but she is not currently a candidate due to recent steroid use.  She has a history of migraines triggered by sunlight, previously managed with medication. She is not currently under active treatment for migraines.  She drives herself to appointments and has requested a prescription for compression stockings due to a hole in her current pair.    PERTINENT PMH / PSH: As above  OBJECTIVE:  BP 132/62   Pulse 85   Temp 98.4 F (36.9 C)   Resp 20   Ht 5\' 3"  (1.6 m)   Wt 230 lb 4 oz (104.4 kg)   SpO2 98%   BMI 40.79 kg/m     Physical Exam Constitutional:      General: She is not in acute distress.    Appearance: She is obese. She is not ill-appearing.  HENT:     Head: Normocephalic.  Eyes:     Conjunctiva/sclera: Conjunctivae normal.  Cardiovascular:     Rate and Rhythm: Normal rate.     Pulses: Normal pulses.  Pulmonary:     Effort: Pulmonary effort is normal.  Musculoskeletal:        General: Tenderness present.  Neurological:     Mental Status: She is alert. Mental status is at baseline.     Motor: No weakness.     Gait: Gait normal.  Psychiatric:        Mood and Affect: Mood normal.        Behavior: Behavior normal.        Thought Content: Thought content normal.        Judgment: Judgment normal.           01/29/2024    2:57 PM 01/27/2024    3:43 PM 12/15/2023   10:45 AM 12/14/2023   12:50 PM 11/30/2023    2:09 PM  Depression screen PHQ 2/9  Decreased Interest 1 2 0 0 0  Down, Depressed, Hopeless 1 1 0 0 0  PHQ - 2 Score 2 3 0 0 0  Altered sleeping 1 2 0  0  Tired, decreased energy 0 2 0  0  Change in appetite 0 1 0  0  Feeling bad or failure about yourself  0 2 0  0  Trouble concentrating 0 2 0  0  Moving slowly or fidgety/restless 0 2 0  0  Suicidal thoughts 0 0 0  0  PHQ-9 Score 3 14 0  0  Difficult doing work/chores Not difficult at all Somewhat difficult Not difficult at all  Not difficult at all      01/29/2024    2:58 PM 01/27/2024    3:44 PM 12/15/2023   10:45 AM 11/30/2023    2:09 PM  GAD 7 : Generalized Anxiety Score  Nervous, Anxious, on Edge 0 3 0 0  Control/stop worrying 0 1 0 0  Worry too much - different things 0 3 0 0  Trouble relaxing 0 1 0 0  Restless 0 3 0 0  Easily annoyed or irritable 0 1 0 0  Afraid - awful might happen 0 3 0 0  Total GAD 7 Score 0 15 0 0  Anxiety Difficulty Not difficult at all Extremely difficult Not difficult at all Not difficult at all    ASSESSMENT/PLAN:  Arthritis of both knees Assessment & Plan: Chronic knee pain with  weakness and balance issues. Recently had bilateral knee injections 01/19/2024 at [facility]. Previous interventions ineffective. Surgical intervention considered post-steroid treatment. - Discuss potential surgical intervention with orthopedic specialist after steroid treatment is completed. -Continue Buprenorphine patch as prescribed by pain management   Other chronic pain Assessment & Plan: Chronic back pain due to spinal stenosis with nerve compression. On buprenorphine patch. Physical therapy and surgical options discussed. - Continue buprenorphine patch for pain management. - Follow up with Pain Management    Lymphedema Assessment & Plan: Requires new compression stockings due to wear and tear. - Provide prescription for new compression stockings.   Orders: -     Compression stockings   Migraine Patient reports migraines triggered by sun exposure, not under active management. Needs DMV form completed.  No previous documentation of migraines or treatment. - Follow up with PCP for completion of forms   PDMP reviewed  Return if symptoms worsen or fail to improve, for PCP.  Valli Gaw, MD

## 2024-01-27 NOTE — Telephone Encounter (Signed)
 Pt has appt today with Tanya to discuss

## 2024-01-27 NOTE — Telephone Encounter (Addendum)
 ATC the patient. Her voicemail was full and I could not leave a message.

## 2024-01-27 NOTE — Telephone Encounter (Signed)
 Please notify patient that PSG revealed moderate/severe OSA, recommend proceeding with APAP therapy set to 4-16 cm H2O, EPR 3 with the Airtouch N30i nasal mask.

## 2024-01-28 DIAGNOSIS — R1310 Dysphagia, unspecified: Principal | ICD-10-CM

## 2024-01-28 NOTE — Addendum Note (Signed)
 Addended by: Jobe Mulder on: 01/28/2024 12:08 PM   Modules accepted: Orders

## 2024-01-28 NOTE — Telephone Encounter (Signed)
 Patient advised. DME order placed. 3 mth fu scheduled. NFN.

## 2024-01-29 ENCOUNTER — Telehealth: Payer: Self-pay | Admitting: Nurse Practitioner

## 2024-01-29 ENCOUNTER — Ambulatory Visit (INDEPENDENT_AMBULATORY_CARE_PROVIDER_SITE_OTHER): Admitting: Nurse Practitioner

## 2024-01-29 ENCOUNTER — Telehealth: Payer: Self-pay

## 2024-01-29 ENCOUNTER — Encounter: Payer: Self-pay | Admitting: Family Medicine

## 2024-01-29 ENCOUNTER — Ambulatory Visit: Payer: Self-pay

## 2024-01-29 VITALS — BP 130/78 | HR 77 | Temp 98.2°F | Ht 63.0 in | Wt 228.1 lb

## 2024-01-29 DIAGNOSIS — M25561 Pain in right knee: Secondary | ICD-10-CM | POA: Diagnosis not present

## 2024-01-29 DIAGNOSIS — Z79899 Other long term (current) drug therapy: Secondary | ICD-10-CM | POA: Insufficient documentation

## 2024-01-29 DIAGNOSIS — M170 Bilateral primary osteoarthritis of knee: Secondary | ICD-10-CM | POA: Diagnosis not present

## 2024-01-29 DIAGNOSIS — Z1231 Encounter for screening mammogram for malignant neoplasm of breast: Secondary | ICD-10-CM | POA: Insufficient documentation

## 2024-01-29 DIAGNOSIS — G8929 Other chronic pain: Secondary | ICD-10-CM | POA: Diagnosis not present

## 2024-01-29 MED ORDER — METHYLPREDNISOLONE 4 MG PO TBPK: ORAL_TABLET | ORAL | 0 refills | Status: DC

## 2024-01-29 MED ORDER — DICLOFENAC SODIUM 1 % EX GEL: 2.0000 g | Freq: Two times a day (BID) | CUTANEOUS | 0 refills | Status: DC

## 2024-01-29 NOTE — Patient Instructions (Signed)
 Take medrol  dose pack, tylenol  arthritis for pain. Alternate hot and cold pack.  Follow up with orthopedics for surgery

## 2024-01-29 NOTE — Telephone Encounter (Signed)
 Spoke to pt per Charan to inform her not to pick up medication from pharmacy that she prescribed because she just had the knee injections.

## 2024-01-29 NOTE — Telephone Encounter (Signed)
 Pt aware & I have cancelled it at the pharmacy.

## 2024-01-29 NOTE — Telephone Encounter (Signed)
 Copied from CRM [id number]. Topic: Clinical - Red Word Triage >> Jan 29, 2024  9:13 AM Allyne Areola wrote: Red Word that prompted transfer to Nurse Triage: Patient is experience right knee pain and swelling for the last two weeks. She's tried at home remedies to help but nothing is working.   Chief Complaint: right knee pain Symptoms: burning pain; swelling Frequency: constant Pertinent Negatives: Patient denies falls or injury Disposition: [] ED /[] Urgent Care (no appt availability in office) / [] Appointment(In office/virtual)/ []  [facility]/ [] Home Care/ [] Refused Recommended Disposition /[] [facility]/ []  Follow-up with PCP Additional Notes: right knee pain x 2 weeks. Appts scheduled for today at 2:40pm Reason for Disposition  [1] MODERATE pain (e.g., interferes with normal activities, limping) AND [2] present > 3 days  Answer Assessment - Initial Assessment Questions 1. LOCATION and RADIATION: "Where is the pain located?"      Right knee; front of knee  2. QUALITY: "What does the pain feel like?"  (e.g., sharp, dull, aching, burning)     Burning pain  3. SEVERITY: "How bad is the pain?" "What does it keep you from doing?"   (Scale 1-10; or mild, moderate, severe)   -  MILD (1-3): doesn't interfere with normal activities    -  MODERATE (4-7): interferes with normal activities (e.g., work or school) or awakens from sleep, limping    -  SEVERE (8-10): excruciating pain, unable to do any normal activities, unable to walk     8/10 pain  4. ONSET: "When did the pain start?" "Does it come and go, or is it there all the time?"     Burning pain x 2 weeks  5. RECURRENT: "Have you had this pain before?" If Yes, ask: "When, and what happened then?"     No  6. SETTING: "Has there been any recent work, exercise or other activity that involved that part of the body?"      No   7. AGGRAVATING FACTORS: "What makes the knee pain worse?" (e.g., walking, climbing stairs,  running)     walking 8. ASSOCIATED SYMPTOMS: "Is there any swelling or redness of the knee?"     Swelling  9. OTHER SYMPTOMS: "Do you have any other symptoms?" (e.g., chest pain, difficulty breathing, fever, calf pain)     No  10. PREGNANCY: "Is there any chance you are pregnant?" "When was your last menstrual period?"       No  Protocols used: Knee Pain-A-AH

## 2024-01-29 NOTE — Progress Notes (Signed)
 Established Patient Office Visit  Subjective:  Patient ID: Heather Sweeney, female    DOB: 22-Apr-1966  Age: 58 y.o. MRN: [id number]  CC:  Chief Complaint  Patient presents with   Knee Pain   Joint Swelling    Right knee pain & swelling, chronic   Discussed the use of a AI scribe software for clinical note transcription with the patient, who gave verbal consent to proceed.  HPI Heather Sweeney is a 58 year old female who presents with right knee swelling and pain.  She experiences significant swelling and burning pain in her right knee, describing it as feeling 'like it's on fire.' The swelling impairs her ability to walk. The knee feels hot to the touch compared to the other knee. She has been using a hot pad and ice for relief, which provides temporary numbness. She has lost her cane, making walking more difficult, and is trying to walk without it. She is actively trying to lose weight, having gone from 250 pounds to 228 pounds, in hopes of improving her knee condition.  HPI   Past Medical History:  Diagnosis Date   Abnormal MRI, lumbar spine 02/12/2021   Abrasion of anterior left lower leg 07/10/2022   Allergic rhinitis    Annual physical exam 04/20/2020   Anxiety    Aperistalsis, esophagus 10/25/2020   Arthritis of knee, right    Arthritis of right knee 10/25/2020   Asthma    Bacterial vaginosis 10/20/2019   Bipolar disorder (HCC)    BMI 37.0-37.9, adult 03/07/2022   BMI 40.0-44.9, adult (HCC) 06/17/2022   Bronchiectasis without complication (HCC) 04/04/2022   Chronic low back pain    Chronic midline low back pain 12/17/2021   Class 3 severe obesity due to excess calories in adult 04/09/2022   CTS (carpal tunnel syndrome)    hands   Depression    Diabetes mellitus without complication (HCC)    Early satiety 02/11/2022   Elevated liver enzymes 08/17/2020   Encounter for screening colonoscopy    Encounter for screening for COVID-19 10/10/2022   Frequent PVCs  09/24/2016   Gallstones    GERD (gastroesophageal reflux disease)    History of prediabetes    Hives 02/23/2019   Hypertension    Hypokalemia 12/13/2021   Incontinence of feces 02/12/2021   Ingrown right big toenail 04/20/2020   Iron deficiency anemia 12/13/2021   Laceration of right foot    Lymphedema 08/24/2022   Obesity (BMI 30-39.9) 09/10/2021   Onychomycosis    OSA on CPAP    does not use cpap   Osteoarthritis of knee 12/17/2021   Overactive bladder    Pain and swelling of right knee 04/20/2020   PVC's (premature ventricular contractions)    PVC's (premature ventricular contractions)    Dr. Linnell Richardson cards   S/P laparoscopic cholecystectomy April 2019 01/12/2018   Seizures (HCC)    8th grade   Tachycardia 10/10/2022   Urticaria 02/23/2019   Weight loss 02/11/2022    Past Surgical History:  Procedure Laterality Date   ABDOMINAL HYSTERECTOMY  07/08/2011   CARPAL TUNNEL RELEASE     right hand 1996   CESAREAN SECTION     2012   CHOLECYSTECTOMY N/A 01/12/2018   Procedure: LAPAROSCOPIC CHOLECYSTECTOMY WITH INTRAOPERATIVE CHOLANGIOGRAM ERAS PATHWAY;  Surgeon: Jacolyn Matar, MD;  Location: [facility];  Service: General;  Laterality: N/A;   COLONOSCOPY WITH PROPOFOL  N/A 04/15/2019   Procedure: COLONOSCOPY WITH PROPOFOL ;  Surgeon: Luke Salaam, MD;  Location: [facility]  ENDOSCOPY;  Service: Gastroenterology;  Laterality: N/A;   DILATION AND CURETTAGE OF UTERUS     04/2011 benign endometrial polyp squamous metaplasia, inflamm, blood, mucous, benign    ESOPHAGOGASTRODUODENOSCOPY (EGD) WITH PROPOFOL  N/A 10/23/2020   Procedure: ESOPHAGOGASTRODUODENOSCOPY (EGD) WITH PROPOFOL ;  Surgeon: Luke Salaam, MD;  Location: [facility] ENDOSCOPY;  Service: Gastroenterology;  Laterality: N/A;   ESOPHAGOGASTRODUODENOSCOPY (EGD) WITH PROPOFOL  N/A 11/05/2023   Procedure: ESOPHAGOGASTRODUODENOSCOPY (EGD) WITH PROPOFOL ;  Surgeon: Luke Salaam, MD;  Location: [facility] ENDOSCOPY;  Service: Gastroenterology;  Laterality: N/A;    FLEXIBLE SIGMOIDOSCOPY N/A 02/15/2021   Procedure: FLEXIBLE SIGMOIDOSCOPY;  Surgeon: Luke Salaam, MD;  Location: [facility] ENDOSCOPY;  Service: Gastroenterology;  Laterality: N/A;   FOOT SURGERY     Dr. Zettie Hillock    LEEP     2006   PANNICULECTOMY N/A 04/24/2022   Procedure: INFRAUMBILICAL PANNICULECTOMY;  Surgeon: Thornell Flirt, DO;  Location: [facility] OR;  Service: Plastics;  Laterality: N/A;   Right hand carpal tunnel release Right 10/2022   scar tissue repair  04/2023   scar tissue from having children and tummy tuck   TUBAL LIGATION      Family History  Problem Relation Age of Onset   Heart failure Mother    Hypertension Mother    Early death Mother 26   Heart disease Mother 64       chf   Cancer Father 57       esophageal    Hypertension Father    Breast cancer Sister 6   Mental illness Sister    Heart failure Sister    Asthma Son    Cancer Paternal Aunt        brain cancer   Thyroid  cancer Neg Hx     Social History   Socioeconomic History   Marital status: Married    Spouse name: Not on file   Number of children: Not on file   Years of education: Not on file   Highest education level: Not on file  Occupational History   Not on file  Tobacco Use   Smoking status: Former    Types: E-cigarettes   Smokeless tobacco: Current    Types: Chew   Tobacco comments:    She uses dip daily. She hasn't smoke cigarettes.  Vaping Use   Vaping status: Never Used  Substance and Sexual Activity   Alcohol  use: Not Currently    Comment: last use 8 years ago   Drug use: Not Currently    Types: Marijuana    Comment: last use age 55   Sexual activity: Not Currently    Partners: Male    Birth control/protection: Surgical    Comment: Hysterectomy 2012  Other Topics Concern   Not on file  Social History Narrative   Married    No guns    Wears seat belt    Safe in relationship    Never smoker    2 sons Geraldean Klein ~10 and another son ~28 as of 10/2019   1 granson eli 1 y.o as of  10/2019   2 sisters    Unemployed       Social Drivers of Corporate investment banker Strain: Low Risk  (09/22/2023)   Overall Financial Resource Strain (CARDIA)    Difficulty of Paying Living Expenses: Not hard at all  Food Insecurity: No Food Insecurity (09/22/2023)   Hunger Vital Sign    Worried About Running Out of Food in the Last Year: Never true    Ran Out  of Food in the Last Year: Never true  Transportation Needs: No Transportation Needs (09/22/2023)   PRAPARE - Administrator, Civil Service (Medical): No    Lack of Transportation (Non-Medical): No  Physical Activity: Inactive (09/22/2023)   Exercise Vital Sign    Days of Exercise per Week: 0 days    Minutes of Exercise per Session: 0 min  Stress: Stress Concern Present (09/22/2023)   [employer] of Occupational Health - Occupational Stress Questionnaire    Feeling of Stress : To some extent  Social Connections: Socially Integrated (09/22/2023)   Social Connection and Isolation Panel [NHANES]    Frequency of Communication with Friends and Family: More than three times a week    Frequency of Social Gatherings with Friends and Family: More than three times a week    Attends Religious Services: More than 4 times per year    Active Member of [employer] or Organizations: Yes    Attends Engineer, structural: More than 4 times per year    Marital Status: Married  Catering manager Violence: Not At Risk (09/22/2023)   Humiliation, Afraid, Rape, and Kick questionnaire    Fear of Current or Ex-Partner: No    Emotionally Abused: No    Physically Abused: No    Sexually Abused: No     Outpatient Medications Prior to Visit  Medication Sig Dispense Refill   ABILIFY MAINTENA 400 MG PRSY prefilled syringe Inject 400 mg into the muscle. Every 4 weeks     ACCU-CHEK GUIDE test strip Use as instructed 100 strip 3   albuterol  (VENTOLIN  HFA) 108 (90 Base) MCG/ACT inhaler Inhale 1-2 puffs into the lungs every 6 (six)  hours as needed for wheezing or shortness of breath. 18 g 11   amLODipine  (NORVASC ) 5 MG tablet TAKE ONE TABLET BY MOUTH ONCE EVERY DAY 90 tablet 1   benztropine  (COGENTIN ) 1 MG tablet Take 1 mg by mouth 2 (two) times daily.     buprenorphine (BUTRANS) 15 MCG/HR 1 patch once a week.     buPROPion (WELLBUTRIN XL) 300 MG 24 hr tablet Take 300 mg by mouth daily.     busPIRone (BUSPAR) 15 MG tablet Take 15 mg by mouth 2 (two) times daily.     DULoxetine  (CYMBALTA ) 20 MG capsule Take 1 capsule (20 mg total) by mouth daily. 90 capsule 3   EPINEPHrine  0.3 mg/0.3 mL IJ SOAJ injection Inject 0.3 mg into the muscle as needed for anaphylaxis. 2 each 3   famotidine  (PEPCID ) 40 MG tablet Take 1 tablet (40 mg total) by mouth daily. 90 tablet 3   FLUoxetine  (PROZAC ) 40 MG capsule Take 40 mg by mouth daily.     fluPHENAZine  (PROLIXIN ) 5 MG tablet Take 5 mg by mouth 2 (two) times daily.     fluticasone  (FLONASE ) 50 MCG/ACT nasal spray INSTILL 2 SPRAYS IN EACH NOSTRIL ONCE DAILY 16 g 10   hydrOXYzine  (ATARAX ) 50 MG tablet Take 50 mg by mouth 2 (two) times daily as needed for anxiety.     Insulin  Pen Needle (NOVOFINE) 30G X 8 MM MISC Use with victoza  daily 1.8 mg 90 each 3   Multiple Vitamins-Minerals (ALIVE MULTI-VITAMIN PO) Take by mouth daily in the afternoon.     nicotine  (NICODERM CQ  - DOSED IN MG/24 HOURS) 21 mg/24hr patch Place 1 patch (21 mg total) onto the skin daily. For 6 weeks, then call office for 14mg /24 hr nicotine  patch. 28 patch 2   nicotine  polacrilex (NICORETTE )  4 MG gum Take 1 each (4 mg total) by mouth as needed for smoking cessation. 20 tablet 1   omalizumab  (XOLAIR ) 150 MG/ML prefilled syringe      omeprazole  (PRILOSEC) 40 MG capsule Take 1 capsule (40 mg total) by mouth 2 times daily at 12 noon and 4 pm. 120 capsule 0   scopolamine  (TRANSDERM-SCOP) 1 MG/3DAYS Place 1 patch (1.5 mg total) onto the skin every 3 (three) days. 4 hours before you get on boat 4 patch 0   Semaglutide ,0.25 or  0.5MG /DOS, 2 MG/3ML SOPN Inject 0.5 mg into the skin once a week.     topiramate  (TOPAMAX ) 25 MG tablet Take 1 tablet (25 mg total) by mouth at bedtime. 90 tablet 3   vitamin B-12 (CYANOCOBALAMIN ) 1000 MCG tablet Take 1,000 mcg by mouth daily.     Blood Glucose Monitoring Suppl (ACCU-CHEK GUIDE ME) w/Device KIT Use as directed,once daily to test blood sugar 1 kit 0   metoprolol  succinate (TOPROL -XL) 25 MG 24 hr tablet Take 25 mg by mouth daily.     No facility-administered medications prior to visit.    Allergies  Allergen Reactions   Hydrochlorothiazide  Rash and Swelling    hydrochlorothiazide    Keflex [Cephalexin] Swelling and Rash   Lisinopril Hives and Other (See Comments)    Headache  Low BP  Angioedema  Other Reaction(s): other  lisinopril   Losartan  Hives and Swelling    Angioedema  losartan    Penicillins Rash    Has patient had a PCN reaction causing immediate rash, facial/tongue/throat swelling, SOB or lightheadedness with hypotension: Yes Has patient had a PCN reaction causing severe rash involving mucus membranes or skin necrosis: No Has patient had a PCN reaction that required hospitalization: No Has patient had a PCN reaction occurring within the last 10 years: No If all of the above answers are "NO", then may proceed with Cephalosporin use.    Bee Venom Swelling    Other Reaction(s): other  honey bee venom   Metformin      Other Reaction(s): other  metformin    Metformin  And Related Rash    ROS Review of Systems Negative unless indicated in HPI.    Objective:    Physical Exam Constitutional:      Appearance: Normal appearance.  Cardiovascular:     Rate and Rhythm: Normal rate and regular rhythm.     Pulses: Normal pulses.     Heart sounds: Normal heart sounds.  Pulmonary:     Effort: Pulmonary effort is normal.     Breath sounds: Normal breath sounds. No wheezing.  Musculoskeletal:        General: Tenderness (knee pain) present.      Cervical back: Normal range of motion.  Neurological:     General: No focal deficit present.     Mental Status: She is alert. Mental status is at baseline.  Psychiatric:        Mood and Affect: Mood normal.        Behavior: Behavior normal.     BP 130/78 (Cuff Size: Large)   Pulse 77   Temp 98.2 F (36.8 C) (Oral)   Ht 5\' 3"  (1.6 m)   Wt 228 lb 2 oz (103.5 kg)   SpO2 97%   BMI 40.41 kg/m  Wt Readings from Last 3 Encounters:  01/29/24 228 lb 2 oz (103.5 kg)  01/27/24 230 lb 4 oz (104.4 kg)  01/07/24 227 lb 3.2 oz (103.1 kg)     Health Maintenance  Topic Date Due   Pneumococcal Vaccine 45-34 Years old (3 of 3 - PPSV23, PCV20 or PCV21) 11/05/2021   COVID-19 Vaccine (4 - 2024-25 season) 06/07/2023   MAMMOGRAM  12/19/2023   INFLUENZA VACCINE  05/06/2024   Medicare Annual Wellness (AWV)  09/21/2024   Colonoscopy  04/14/2029   DTaP/Tdap/Td (4 - Td or Tdap) 11/25/2031   Hepatitis C Screening  Completed   HIV Screening  Completed   Zoster Vaccines- Shingrix   Completed   HPV VACCINES  Aged Out   Meningococcal B Vaccine  Aged Out    There are no preventive care reminders to display for this patient.  Lab Results  Component Value Date   TSH 1.21 09/22/2023   Lab Results  Component Value Date   WBC 4.9 11/06/2023   HGB 12.4 11/06/2023   HCT 34.4 (L) 11/06/2023   MCV 74.8 (L) 11/06/2023   PLT 224 11/06/2023   Lab Results  Component Value Date   NA 140 11/06/2023   K 4.0 11/06/2023   CO2 22 11/06/2023   GLUCOSE 95 11/06/2023   BUN 6 11/06/2023   CREATININE 0.99 11/06/2023   BILITOT 0.4 09/22/2023   ALKPHOS 90 09/22/2023   AST 11 09/22/2023   ALT 14 09/22/2023   PROT 6.7 09/22/2023   ALBUMIN 4.1 09/22/2023   CALCIUM 9.1 11/06/2023   ANIONGAP 11 11/06/2023   EGFR 45 (L) 03/04/2022   GFR 67.51 10/23/2023   Lab Results  Component Value Date   CHOL 147 09/22/2023   Lab Results  Component Value Date   HDL 57.90 09/22/2023   Lab Results  Component Value  Date   LDLCALC 74 09/22/2023   Lab Results  Component Value Date   TRIG 76.0 09/22/2023   Lab Results  Component Value Date   CHOLHDL 3 09/22/2023   Lab Results  Component Value Date   HGBA1C 5.3 09/22/2023      Assessment & Plan:  Chronic pain of right knee  Arthritis of both knees Assessment & Plan: Chronic knee pain. Recently had bilateral knee injections 01/19/2024 at [facility]. - Discuss potential surgical intervention with orthopedic specialist Dr. Lydia Sams. -Advised to alternate hot and cold pack. -Can try arthritic creams.  -Continue Buprenorphine patch as prescribed by pain management   Other orders -     Diclofenac  Sodium; Apply 2 g topically 2 (two) times daily.  Dispense: 50 g; Refill: 0    Follow-up: No follow-ups on file.   Matalyn Nawaz, NP

## 2024-01-29 NOTE — Assessment & Plan Note (Signed)
 Chronic back pain due to spinal stenosis with nerve compression. On buprenorphine patch. Physical therapy and surgical options discussed. - Continue buprenorphine patch for pain management. - Follow up with Pain Management

## 2024-01-29 NOTE — Assessment & Plan Note (Addendum)
 Requires new compression stockings due to wear and tear. - Provide prescription for new compression stockings.

## 2024-01-29 NOTE — Assessment & Plan Note (Signed)
 Chronic knee pain with weakness and balance issues. Recently had bilateral knee injections 01/19/2024 at [facility]. Previous interventions ineffective. Surgical intervention considered post-steroid treatment. - Discuss potential surgical intervention with orthopedic specialist after steroid treatment is completed. -Continue Buprenorphine patch as prescribed by pain management

## 2024-02-03 DIAGNOSIS — Z5181 Encounter for therapeutic drug level monitoring: Secondary | ICD-10-CM | POA: Diagnosis not present

## 2024-02-03 DIAGNOSIS — M25569 Pain in unspecified knee: Secondary | ICD-10-CM | POA: Diagnosis not present

## 2024-02-03 DIAGNOSIS — G894 Chronic pain syndrome: Secondary | ICD-10-CM | POA: Diagnosis not present

## 2024-02-03 DIAGNOSIS — Z79891 Long term (current) use of opiate analgesic: Secondary | ICD-10-CM | POA: Diagnosis not present

## 2024-02-03 DIAGNOSIS — M47816 Spondylosis without myelopathy or radiculopathy, lumbar region: Secondary | ICD-10-CM | POA: Diagnosis not present

## 2024-02-03 DIAGNOSIS — M797 Fibromyalgia: Secondary | ICD-10-CM | POA: Diagnosis not present

## 2024-02-03 DIAGNOSIS — M5451 Vertebrogenic low back pain: Secondary | ICD-10-CM | POA: Diagnosis not present

## 2024-02-14 NOTE — Assessment & Plan Note (Signed)
 Chronic knee pain. Recently had bilateral knee injections 01/19/2024 at [facility]. - Discuss potential surgical intervention with orthopedic specialist Dr. Lydia Sams. -Advised to alternate hot and cold pack. -Can try arthritic creams.  -Continue Buprenorphine patch as prescribed by pain management

## 2024-02-15 ENCOUNTER — Telehealth: Payer: Self-pay | Admitting: Family

## 2024-02-15 ENCOUNTER — Other Ambulatory Visit: Payer: Self-pay

## 2024-02-15 ENCOUNTER — Ambulatory Visit: Admitting: Family

## 2024-02-15 ENCOUNTER — Emergency Department
Admission: EM | Admit: 2024-02-15 | Discharge: 2024-02-15 | Disposition: A | Discharge status: Home / Self Care | Attending: Emergency Medicine | Admitting: Emergency Medicine

## 2024-02-15 DIAGNOSIS — W109XXA Fall (on) (from) unspecified stairs and steps, initial encounter: Secondary | ICD-10-CM | POA: Diagnosis not present

## 2024-02-15 DIAGNOSIS — G894 Chronic pain syndrome: Secondary | ICD-10-CM | POA: Insufficient documentation

## 2024-02-15 DIAGNOSIS — M7918 Myalgia, other site: Secondary | ICD-10-CM | POA: Diagnosis present

## 2024-02-15 DIAGNOSIS — W19XXXA Unspecified fall, initial encounter: Secondary | ICD-10-CM

## 2024-02-15 MED ORDER — LIDOCAINE 5 % EX PTCH: 1.0000 | MEDICATED_PATCH | Freq: Two times a day (BID) | CUTANEOUS | 0 refills | Status: DC

## 2024-02-15 MED ORDER — LIDOCAINE 5 % EX PTCH
1.0000 | MEDICATED_PATCH | CUTANEOUS | Status: DC
  Administered 2024-02-15: 1 via TRANSDERMAL
  Filled 2024-02-15: qty 1

## 2024-02-15 NOTE — Telephone Encounter (Signed)
 Unable to leave voice message, voice box is full. If patient calls back [id number] please reschedule appointment.

## 2024-02-15 NOTE — ED Triage Notes (Addendum)
 POV with CC of mechanical fall down 5 stairs and associated generalized back pain and bilateral knee pain. Denies LOC. A&Ox4 and ambulatory with cane.

## 2024-02-15 NOTE — ED Provider Notes (Signed)
 [facility] Provider Note    Event Date/Time   First MD Initiated Contact with Patient 02/15/24 0112     (approximate)   History   Fall   HPI Heather Sweeney is a 58 y.o. female with history of chronic pain syndrome with buprenorphine patches and a pain management doctor in [place] or [place].  She presents after a fall.  She says that she had a mechanical fall down a few stairs; she said that she lost her balance because of chronic knee pain and she uses a cane at baseline and that caused her to fall backwards.  She landed on her backside as well as her hands and arms and said that she has pain all throughout everywhere, but mostly in her back (with a history of chronic lumbar pain) and with both of her knees.  She is able to bear weight although it hurts to do so and she can fully flex and extend her knees.  She did not strike her head and has no headache nor neck pain.     Physical Exam   Triage Vital Signs: ED Triage Vitals  Encounter Vitals Group     BP 02/15/24 0049 136/69     Systolic BP Percentile --      Diastolic BP Percentile --      Pulse Rate 02/15/24 0049 85     Resp 02/15/24 0049 20     Temp 02/15/24 0049 98.1 F (36.7 C)     Temp Source 02/15/24 0049 Oral     SpO2 02/15/24 0049 100 %     Weight --      Height --      Head Circumference --      Peak Flow --      Pain Score 02/15/24 0052 5     Pain Loc --      Pain Education --      Exclude from Growth Chart --     Most recent vital signs: Vitals:   02/15/24 0049  BP: 136/69  Pulse: 85  Resp: 20  Temp: 98.1 F (36.7 C)  SpO2: 100%    General: Awake, no distress.  Conversant, pleasant mood and affect, in good spirits despite the ED visit. CV:  Good peripheral perfusion.  Regular rate and rhythm. Resp:  Normal effort. Speaking easily and comfortably, no accessory muscle usage nor intercostal retractions.   Abd:  No distention.  Obese, no tenderness to  palpation. Other:  No visible signs of injury.  Patient is able to passively and actively flex both knees without any apparent pain.  No visible signs of injury to her back.  She has some tenderness to palpation of the paraspinal muscles in the lumbar spine but no palpable deformities and no evidence of contusion or ecchymosis.   ED Results / Procedures / Treatments   Labs (all labs ordered are listed, but only abnormal results are displayed) Labs Reviewed - No data to display   PROCEDURES:  Critical Care performed: No  Procedures    IMPRESSION / MDM / ASSESSMENT AND PLAN / ED COURSE  I reviewed the triage vital signs and the nursing notes.                              Differential diagnosis includes, but is not limited to, mechanical fall, contusion, musculoskeletal strain, fracture, exacerbation of chronic pain.  Patient's presentation is most consistent  with acute presentation with potential threat to life or bodily function.   Interventions/Medications given:  Medications  lidocaine  (LIDODERM ) 5 % 1 patch (1 patch Transdermal Patch Applied 02/15/24 0150)    (Note:  hospital course my include additional interventions and/or labs/studies not listed above.)   Reassuring physical exam.  Patient agrees no need for x-rays.  I verified in the controlled substance database that she has active prescriptions for buprenorphine.  I recommended the use of continued buprenorphine patches as well as some Lidoderm  patches and over-the-counter pain medication.  She agrees with the plan.  She is ambulatory without the need for assistance with her usual cane.  I gave my usual and customary return precautions.  She is in good spirits at the time of discharge.         FINAL CLINICAL IMPRESSION(S) / ED DIAGNOSES   Final diagnoses:  Fall, initial encounter  Musculoskeletal pain  Chronic pain syndrome     Rx / DC Orders   ED Discharge Orders          Ordered    lidocaine   (LIDODERM ) 5 %  Every 12 hours        02/15/24 0141             Note:  This document was prepared using Dragon voice recognition software and may include unintentional dictation errors.   Lynnda Sas, MD 02/15/24 [phone]

## 2024-02-15 NOTE — Discharge Instructions (Signed)

## 2024-02-16 ENCOUNTER — Ambulatory Visit: Admitting: Family Medicine

## 2024-02-18 ENCOUNTER — Other Ambulatory Visit: Payer: Self-pay | Admitting: Family Medicine

## 2024-02-18 ENCOUNTER — Ambulatory Visit: Admitting: Family

## 2024-02-23 ENCOUNTER — Ambulatory Visit: Payer: Self-pay | Admitting: Sleep Medicine

## 2024-02-23 ENCOUNTER — Ambulatory Visit: Payer: Self-pay

## 2024-02-23 ENCOUNTER — Encounter (INDEPENDENT_AMBULATORY_CARE_PROVIDER_SITE_OTHER): Payer: Self-pay

## 2024-02-23 NOTE — Telephone Encounter (Signed)
  Chief Complaint: vomiting Symptoms: vomiting, nausea, cannot keep anything down Frequency: 12 hours Pertinent Negatives: Patient denies bloody emesis Disposition: [] ED /[x] Urgent Care (no appt availability in office) / [] Appointment(In office/virtual)/ []  [facility]/ [] Home Care/ [] Refused Recommended Disposition /[] [facility]/ [x]  Follow-up with PCP Additional Notes: appointment scheduled for 5/21. Patient will proceed to UC if symptoms persist.  Copied from CRM [id number]. Topic: Clinical - Red Word Triage >> Feb 23, 2024 10:26 AM [place] A wrote: [name] that prompted transfer to Nurse Triage: Patient has bad cramps in her stomach, vomiting and urinating on her self for two days Reason for Disposition  [1] MILD or MODERATE vomiting AND [2] present > 48 hours (2 days) (Exception: Mild vomiting with associated diarrhea.)  Answer Assessment - Initial Assessment Questions 1. VOMITING SEVERITY: "How many times have you vomited in the past 24 hours?"     - MILD:  1 - 2 times/day    - MODERATE: 3 - 5 times/day, decreased oral intake without significant weight loss or symptoms of dehydration    - SEVERE: 6 or more times/day, vomits everything or nearly everything, with significant weight loss, symptoms of dehydration      6 2. ONSET: "When did the vomiting begin?"      12 hours ago 3. FLUIDS: "What fluids or food have you vomited up today?" "Have you been able to keep any fluids down?"     Unable to eat or drink today 4. ABDOMEN PAIN: "Are your having any abdomen pain?" If Yes : "How bad is it and what does it feel like?" (e.g., crampy, dull, intermittent, constant)      Yes, middle of abdomen 5. DIARRHEA: "Is there any diarrhea?" If Yes, ask: "How many times today?"      no 6. CONTACTS: "Is there anyone else in the family with the same symptoms?"      no 7. CAUSE: "What do you think is causing your vomiting?"     unknown 8. HYDRATION STATUS: "Any signs of  dehydration?" (e.g., dry mouth [not only dry lips], too weak to stand) "When did you last urinate?"     N/A 9. OTHER SYMPTOMS: "Do you have any other symptoms?" (e.g., fever, headache, vertigo, vomiting blood or coffee grounds, recent head injury)     Head ache, chills  Protocols used: Vomiting-A-AH

## 2024-02-23 NOTE — Telephone Encounter (Signed)
 PSG results relayed.

## 2024-02-24 ENCOUNTER — Encounter: Payer: Self-pay | Admitting: Family

## 2024-02-24 ENCOUNTER — Ambulatory Visit (INDEPENDENT_AMBULATORY_CARE_PROVIDER_SITE_OTHER): Admitting: Family

## 2024-02-24 ENCOUNTER — Telehealth: Payer: Self-pay

## 2024-02-24 ENCOUNTER — Ambulatory Visit (INDEPENDENT_AMBULATORY_CARE_PROVIDER_SITE_OTHER)

## 2024-02-24 VITALS — BP 140/80 | HR 103 | Temp 98.5°F | Ht 64.0 in | Wt 231.4 lb

## 2024-02-24 DIAGNOSIS — F419 Anxiety disorder, unspecified: Secondary | ICD-10-CM | POA: Diagnosis not present

## 2024-02-24 DIAGNOSIS — R112 Nausea with vomiting, unspecified: Secondary | ICD-10-CM

## 2024-02-24 DIAGNOSIS — F32A Depression, unspecified: Secondary | ICD-10-CM | POA: Diagnosis not present

## 2024-02-24 DIAGNOSIS — R1110 Vomiting, unspecified: Secondary | ICD-10-CM | POA: Insufficient documentation

## 2024-02-24 DIAGNOSIS — K219 Gastro-esophageal reflux disease without esophagitis: Secondary | ICD-10-CM

## 2024-02-24 LAB — COMPREHENSIVE METABOLIC PANEL WITH GFR
ALT: 17 U/L (ref 0–35)
AST: 13 U/L (ref 0–37)
Albumin: 4.5 g/dL (ref 3.5–5.2)
Alkaline Phosphatase: 93 U/L (ref 39–117)
BUN: 11 mg/dL (ref 6–23)
CO2: 31 meq/L (ref 19–32)
Calcium: 9.7 mg/dL (ref 8.4–10.5)
Chloride: 100 meq/L (ref 96–112)
Creatinine, Ser: 1.12 mg/dL (ref 0.40–1.20)
GFR: 55.28 mL/min — ABNORMAL LOW (ref >60.00)
Glucose, Bld: 91 mg/dL (ref 70–99)
Potassium: 4.1 meq/L (ref 3.5–5.1)
Sodium: 137 meq/L (ref 135–145)
Total Bilirubin: 0.5 mg/dL (ref 0.2–1.2)
Total Protein: 7.7 g/dL (ref 6.0–8.3)

## 2024-02-24 LAB — CBC WITH DIFFERENTIAL/PLATELET
Basophils Absolute: 0 10*3/uL (ref 0.0–0.1)
Basophils Relative: 0.6 % (ref 0.0–3.0)
Eosinophils Absolute: 0.1 10*3/uL (ref 0.0–0.7)
Eosinophils Relative: 1.5 % (ref 0.0–5.0)
HCT: 39.8 % (ref 36.0–46.0)
Hemoglobin: 13.4 g/dL (ref 12.0–15.0)
Lymphocytes Relative: 26.1 % (ref 12.0–46.0)
Lymphs Abs: 1.7 10*3/uL (ref 0.7–4.0)
MCHC: 33.6 g/dL (ref 30.0–36.0)
MCV: 79.9 fl (ref 78.0–100.0)
Monocytes Absolute: 0.7 10*3/uL (ref 0.1–1.0)
Monocytes Relative: 11.3 % (ref 3.0–12.0)
Neutro Abs: 3.9 10*3/uL (ref 1.4–7.7)
Neutrophils Relative %: 60.5 % (ref 43.0–77.0)
Platelets: 210 10*3/uL (ref 150.0–400.0)
RBC: 4.99 Mil/uL (ref 3.87–5.11)
RDW: 15.9 % — ABNORMAL HIGH (ref 11.5–15.5)
WBC: 6.5 10*3/uL (ref 4.0–10.5)

## 2024-02-24 MED ORDER — ONDANSETRON 4 MG PO TBDP: 4.0000 mg | ORAL_TABLET | Freq: Three times a day (TID) | ORAL | 0 refills | Status: AC | PRN

## 2024-02-24 NOTE — Telephone Encounter (Signed)
 Copied from CRM [id number]. Topic: General - Other >> Feb 24, 2024 11:10 AM Howard Macho wrote: Reason for CRM: patient called stating she need a paper to show the police that she has a medical necessity for her tinted windows in her car CB [phone]

## 2024-02-24 NOTE — Telephone Encounter (Signed)
 Copied from CRM [id number]. Topic: General - Other >> Feb 24, 2024 11:10 AM Howard Macho wrote: Reason for CRM: patient called stating she need a paper to show the police that she has a medical necessity for her tinted windows in her car CB [phone] >> Feb 24, 2024  1:54 PM Howard Macho wrote: The patient returning a call back and she stated when the sun is out she gets lightheaded and when she does not have a shade on her window it bothers her

## 2024-02-24 NOTE — Patient Instructions (Signed)
 Concern for constipation and possible slow motility in the gut.   I will reach out to Dr Antony Baumgartner  Please start zofran  for nausea.   Please go directly to [facility] to [facility]R of your abdomen  Please start taking small frequent sips of ginger ale or Pedialyte.  You may also use popsicles.  Do not drink a lot of water at 1 time.  Slowly advance your diet as tolerated to broth and then you may add something like a saltine or piece of toast.  Please let me know how you are doing

## 2024-02-24 NOTE — Assessment & Plan Note (Addendum)
 Denies SI/ HI She notes she was unable to see dry erase board for PHQ9 today. She didn't intent to circle SI. Denies suidice plan or h/o prior suicide attempt. Health concerns are trigger for her.  Discussed continue follow-up with psychiatry.  No medication made changes made at this time

## 2024-02-24 NOTE — Assessment & Plan Note (Addendum)
 Differential includes constipation, gastroparesis, gastroenteritis.  Concern for early satiety and black stools.   No weight loss.  If symptom persists, will collaborate with Dr. Antony Baumgartner in regards to gastric emptying study, colonoscopy.  Encouraged small frequent sips of water and to slowly advance diet as tolerated.  Refilled Zofran .  Pending abdominal x-ray

## 2024-02-24 NOTE — Progress Notes (Signed)
 Assessment & Plan:  Nausea and vomiting, unspecified vomiting type Assessment & Plan: Differential includes constipation, gastroparesis, gastroenteritis.  Concern for early satiety and black stools.   No weight loss.  If symptom persists, will collaborate with Dr. Antony Baumgartner in regards to gastric emptying study, colonoscopy.  Encouraged small frequent sips of water and to slowly advance diet as tolerated.  Refilled Zofran .  Pending abdominal x-ray  Orders: -     DG Abd 1 View; Future -     CBC with Differential/Platelet -     Comprehensive metabolic panel with GFR -     Ondansetron ; Take 1 tablet (4 mg total) by mouth every 8 (eight) hours as needed for nausea or vomiting.  Dispense: 20 tablet; Refill: 0  Anxiety and depression Assessment & Plan: Denies SI/ HI She notes she was unable to see dry erase board for PHQ9 today. She didn't intent to circle SI. Denies suidice plan or h/o prior suicide attempt. Health concerns are trigger for her.  Discussed continue follow-up with psychiatry.  No medication made changes made at this time   Gastroesophageal reflux disease, unspecified whether esophagitis present Assessment & Plan: Chronic, over stable.  Continue Prilosec 40 mg daily, Pepcid  40mg  every day. Consider changes to regimen if vomiting persists      Return precautions given.   Risks, benefits, and alternatives of the medications and treatment plan prescribed today were discussed, and patient expressed understanding.   Education regarding symptom management and diagnosis given to patient on AVS either electronically or printed.  No follow-ups on file.  Bascom Bossier, FNP  Subjective:    Patient ID: Heather Sweeney, female    DOB: Nov 26, 1965, 58 y.o.   MRN: [id number]  CC: CHEKESHA BEHLKE is a 58 y.o. female who presents today for an acute visit.    HPI: Complains of non bloody vomiting x 7 days,  She is vomiting approx 4 times per day, 4 times earlier today. Describes as  bile. She is burping while vomiting.   Vomiting after eating or when not eating.  She is burping now.  Endorses chills, nausea,middle abdominal cramping,early satiety,   Denies fever, abdominal pain, dysuria,  diarrhea, weight loss.   Last BM today which normal brown, non bloody. Stool was black. She is not on iron.   She is trying to stay hydrated  NO recent travel, sick contacts, unsuall foods  No new medication  Compliant with pepcid  40mg  qd , Prilosec 40mg  every day    She has gained 1 lb She is no longer ozempic ; she has been for 5 months at least     She is vomited the amlodipine   EGD 11/05/23 with abnormal esophageal dismotility Colonoscopy 04/15/2019 and repeat in 10 years.  No hemorrhoids She has felt more depression while be sick the past couple of days.   No nsaid or alcohol  use.   Denies SI/ HI She notes she was unable to 'see' dry erase board for PHQ9 today. She didn't intent to circle SI. Denies suicide plan or h/o prior suicide attempt  Allergies: Hydrochlorothiazide , Keflex [cephalexin], Lisinopril, Losartan , Penicillins, Bee venom, Metformin , and Metformin  and related Current Outpatient Medications on File Prior to Visit  Medication Sig Dispense Refill   amLODipine  (NORVASC ) 5 MG tablet TAKE ONE TABLET BY MOUTH ONCE EVERY DAY 90 tablet 1   benztropine  (COGENTIN ) 1 MG tablet Take 1 mg by mouth 2 (two) times daily.     Blood Glucose Monitoring Suppl (ACCU-CHEK GUIDE ME) w/Device  KIT Use as directed,once daily to test blood sugar 1 kit 0   buPROPion (WELLBUTRIN XL) 300 MG 24 hr tablet Take 300 mg by mouth daily.     busPIRone (BUSPAR) 15 MG tablet Take 15 mg by mouth 2 (two) times daily.     EPINEPHrine  0.3 mg/0.3 mL IJ SOAJ injection Inject 0.3 mg into the muscle as needed for anaphylaxis. 2 each 3   famotidine  (PEPCID ) 40 MG tablet TAKE ONE TABLET BY MOUTH ONCE EVERY DAY 120 tablet 11   FLUoxetine  (PROZAC ) 40 MG capsule Take 40 mg by mouth daily.      fluPHENAZine  (PROLIXIN ) 5 MG tablet Take 5 mg by mouth 2 (two) times daily.     metoprolol  succinate (TOPROL -XL) 25 MG 24 hr tablet Take 25 mg by mouth daily.     Multiple Vitamins-Minerals (ALIVE MULTI-VITAMIN PO) Take by mouth daily in the afternoon.     omeprazole  (PRILOSEC) 40 MG capsule TAKE 1 CAPSULE BY MOUTH ONCE EVERY DAY 120 capsule 11   ACCU-CHEK GUIDE test strip Use as instructed (Patient not taking: Reported on 02/24/2024) 100 strip 3   albuterol  (VENTOLIN  HFA) 108 (90 Base) MCG/ACT inhaler Inhale 1-2 puffs into the lungs every 6 (six) hours as needed for wheezing or shortness of breath. (Patient not taking: Reported on 02/24/2024) 18 g 11   buprenorphine (BUTRANS) 15 MCG/HR 1 patch once a week. (Patient not taking: Reported on 02/24/2024)     DULoxetine  (CYMBALTA ) 20 MG capsule Take 1 capsule (20 mg total) by mouth daily. (Patient not taking: Reported on 02/24/2024) 90 capsule 3   fluticasone  (FLONASE ) 50 MCG/ACT nasal spray INSTILL 2 SPRAYS IN EACH NOSTRIL ONCE DAILY (Patient not taking: Reported on 02/24/2024) 16 g 10   Insulin  Pen Needle (NOVOFINE) 30G X 8 MM MISC Use with victoza  daily 1.8 mg (Patient not taking: Reported on 02/24/2024) 90 each 3   lidocaine  (LIDODERM ) 5 % Place 1 patch onto the skin every 12 (twelve) hours. Remove & Discard patch within 12 hours or as directed by MD.  Lindy Rhyme the patch off for 12 hours before applying a new one. (Patient not taking: Reported on 02/24/2024) 30 patch 0   omalizumab  (XOLAIR ) 150 MG/ML prefilled syringe  (Patient not taking: Reported on 02/24/2024)     topiramate  (TOPAMAX ) 25 MG tablet Take 1 tablet (25 mg total) by mouth at bedtime. (Patient not taking: Reported on 02/24/2024) 90 tablet 3   No current facility-administered medications on file prior to visit.    Review of Systems  Constitutional:  Negative for chills and fever.  Respiratory:  Negative for cough.   Cardiovascular:  Negative for chest pain and palpitations.  Gastrointestinal:   Negative for nausea and vomiting.      Objective:    BP (!) 140/80   Pulse (!) 103   Temp 98.5 F (36.9 C) (Oral)   Ht 5\' 4"  (1.626 m)   Wt 231 lb 6.4 oz (105 kg)   SpO2 95%   BMI 39.72 kg/m   BP Readings from Last 3 Encounters:  02/24/24 (!) 140/80  02/15/24 136/69  01/29/24 130/78   Wt Readings from Last 3 Encounters:  02/24/24 231 lb 6.4 oz (105 kg)  01/29/24 228 lb 2 oz (103.5 kg)  01/27/24 230 lb 4 oz (104.4 kg)      02/24/2024    9:56 AM 01/29/2024    2:57 PM 01/27/2024    3:43 PM  Depression screen PHQ 2/9  Decreased Interest 1 1  2  Down, Depressed, Hopeless 1 1 1   PHQ - 2 Score 2 2 3   Altered sleeping 1 1 2   Tired, decreased energy 1 0 2  Change in appetite 1 0 1  Feeling bad or failure about yourself  1 0 2  Trouble concentrating 1 0 2  Moving slowly or fidgety/restless 1 0 2  Suicidal thoughts 1 0 0  PHQ-9 Score 9 3 14   Difficult doing work/chores Somewhat difficult Not difficult at all Somewhat difficult     Physical Exam Vitals reviewed.  Constitutional:      Appearance: She is well-developed.  Eyes:     Conjunctiva/sclera: Conjunctivae normal.  Cardiovascular:     Rate and Rhythm: Normal rate and regular rhythm.     Pulses: Normal pulses.     Heart sounds: Normal heart sounds.  Pulmonary:     Effort: Pulmonary effort is normal.     Breath sounds: Normal breath sounds. No wheezing, rhonchi or rales.  Skin:    General: Skin is warm and dry.  Neurological:     Mental Status: She is alert.  Psychiatric:        Speech: Speech normal.        Behavior: Behavior normal.        Thought Content: Thought content normal.

## 2024-02-25 NOTE — Telephone Encounter (Deleted)
 Heather Sweeney

## 2024-02-26 ENCOUNTER — Ambulatory Visit: Payer: Self-pay | Admitting: Family

## 2024-02-26 DIAGNOSIS — K219 Gastro-esophageal reflux disease without esophagitis: Secondary | ICD-10-CM

## 2024-02-26 NOTE — Telephone Encounter (Signed)
 Note provided and placed up front. Pt has been notified

## 2024-02-26 NOTE — Telephone Encounter (Signed)
 ATC the patient but her mailbox was full and I could not leave a message. [id number] please advise when patient calls back.

## 2024-02-27 NOTE — Telephone Encounter (Signed)
 Call pt  Referral to dr Antony Baumgartner whom is moving to [facility]. Ihave placed Also, are are stools still black in color? Any blood?  Please confirm that she is not on iron or pepto bismol as both can change color of stool

## 2024-02-27 NOTE — Assessment & Plan Note (Addendum)
 Chronic, over stable.  Continue Prilosec 40 mg daily, Pepcid  40mg  every day. Consider changes to regimen if vomiting persists

## 2024-03-02 NOTE — Telephone Encounter (Signed)
 Contacted patient. She reports [facility] has reached out to her and has appt scheduled. NFN.

## 2024-03-08 ENCOUNTER — Inpatient Hospital Stay: Admission: RE | Admit: 2024-03-08 | Source: Ambulatory Visit

## 2024-03-15 ENCOUNTER — Ambulatory Visit: Admitting: Physical Medicine and Rehabilitation

## 2024-03-29 ENCOUNTER — Other Ambulatory Visit: Payer: Self-pay | Admitting: Family

## 2024-03-29 ENCOUNTER — Telehealth: Payer: Self-pay

## 2024-03-29 DIAGNOSIS — Z1231 Encounter for screening mammogram for malignant neoplasm of breast: Secondary | ICD-10-CM

## 2024-03-29 NOTE — Telephone Encounter (Signed)
 Copied from CRM [id number]. Topic: Referral - Request for Referral >> Mar 29, 2024  1:57 PM Franky GRADE wrote: Did the patient discuss referral with their provider in the last year? Yes (If No - schedule appointment) (If Yes - send message)  Appointment offered? Yes  Type of order/referral and detailed reason for visit: Ortho/Sports Medicine for back and knee pain  Preference of office, provider, location: Anywhere local  If referral order, have you been seen by this specialty before? Yes (If Yes, this issue or another issue? When? Where?  Can we respond through MyChart? Yes

## 2024-03-30 ENCOUNTER — Ambulatory Visit
Admission: RE | Admit: 2024-03-30 | Discharge: 2024-03-30 | Disposition: A | Discharge status: Home / Self Care | Source: Ambulatory Visit | Attending: Family | Admitting: Family

## 2024-03-30 DIAGNOSIS — Z1231 Encounter for screening mammogram for malignant neoplasm of breast: Secondary | ICD-10-CM

## 2024-03-30 NOTE — Telephone Encounter (Signed)
 Spoke to pt she has new insurance will call and give me new Dr name that is in her network so we can place referral

## 2024-03-30 NOTE — Telephone Encounter (Signed)
 Call pt  She had been following with :  Following  follow-up with [facility], Dr Lorilee for treatment of bilateral knee pain.    AND  Following with [facility], Dr Lanis, phone [phone]  I would recommend follow up with Dr Lorilee for continuity of care. Please ask her to call to schedule Last seen 12/14/23

## 2024-03-31 ENCOUNTER — Other Ambulatory Visit: Payer: Self-pay | Admitting: Family

## 2024-03-31 DIAGNOSIS — M5417 Radiculopathy, lumbosacral region: Secondary | ICD-10-CM

## 2024-03-31 DIAGNOSIS — M170 Bilateral primary osteoarthritis of knee: Secondary | ICD-10-CM

## 2024-03-31 NOTE — Telephone Encounter (Signed)
 [facility] [address] [address]

## 2024-03-31 NOTE — Telephone Encounter (Signed)
 Spoke to pt she stated that she needs a referral to pain management because of her new insurance [facility]

## 2024-04-05 ENCOUNTER — Encounter: Payer: Self-pay | Admitting: Plastic Surgery

## 2024-04-05 ENCOUNTER — Encounter (INDEPENDENT_AMBULATORY_CARE_PROVIDER_SITE_OTHER): Payer: Self-pay

## 2024-04-05 ENCOUNTER — Ambulatory Visit (INDEPENDENT_AMBULATORY_CARE_PROVIDER_SITE_OTHER): Payer: 59 | Admitting: Plastic Surgery

## 2024-04-05 DIAGNOSIS — M793 Panniculitis, unspecified: Secondary | ICD-10-CM | POA: Diagnosis not present

## 2024-04-05 DIAGNOSIS — Z6841 Body Mass Index (BMI) 40.0 and over, adult: Secondary | ICD-10-CM

## 2024-04-05 NOTE — Progress Notes (Signed)
   Subjective:    Patient ID: Heather Sweeney, female    DOB: [birth year], 58 y.o.   MRN: [id number]  The patient is a 58 year old female here for evaluation of her abdomen.  She is 5 feet 3 inches tall and is now 239 pounds.  She has been really good about continuing to lose weight.  In July 2023 she underwent a panniculectomy and did well for that.  She is now interested in getting the upper part removed. Her past medical history includes prediabetes, hyperlipidemia and hypertension.  She is not working with anyone on the weight loss but is willing to see the healthy weight and wellness folks     Review of Systems  Constitutional: Negative.   Eyes: Negative.   Respiratory: Negative.    Cardiovascular: Negative.   Gastrointestinal: Negative.   Endocrine: Negative.   Genitourinary: Negative.        Objective:   Physical Exam Vitals reviewed.  Constitutional:      Appearance: Normal appearance.  HENT:     Head: Atraumatic.   Cardiovascular:     Rate and Rhythm: Normal rate.     Pulses: Normal pulses.  Abdominal:     Palpations: Abdomen is soft.   Skin:    General: Skin is warm.     Capillary Refill: Capillary refill takes less than 2 seconds.   Neurological:     Mental Status: She is alert.   Psychiatric:        Mood and Affect: Mood normal.        Behavior: Behavior normal.        Thought Content: Thought content normal.        Judgment: Judgment normal.           Assessment & Plan:     ICD-10-CM   1. Morbid obesity (HCC) BMI=42.9  E66.01     2. Panniculitis  M79.3       I explained that the upper part of the abdomen would be an abdominoplasty.  I recommend that she get under 200 pounds before she would seek surgery for that.  I would want her to have the best result possible especially if she is investing in this.  She is willing to see the healthy weight and wellness folks and we will get that set up for her and then plan to see her back in 6 months.

## 2024-04-12 ENCOUNTER — Telehealth: Payer: Self-pay

## 2024-04-12 NOTE — Telephone Encounter (Signed)
 Copied from CRM [id number]. Topic: Clinical - Medication Question >> Apr 12, 2024  2:56 PM Mesmerise C wrote: Reason for CRM: Patient is requesting pain cream spoke to [place] told her to give a call and request it and would like to speak to nurse not in her medications list patient can be reached at [phone]

## 2024-04-13 NOTE — Telephone Encounter (Signed)
 Spoke to pt and informed her of message below, pt verbalized understanding  May recommend otc Biofreeze roll on Salon pas pain patch    She has renal disease; I DO NOT recommend OTC voltarn gel as this is an NSAID

## 2024-04-13 NOTE — Telephone Encounter (Signed)
 May recommend otc Biofreeze roll on Salon pas pain patch   She has renal disease; I DO NOT recommend OTC voltarn gel as this is an NSAID.

## 2024-04-18 ENCOUNTER — Encounter (INDEPENDENT_AMBULATORY_CARE_PROVIDER_SITE_OTHER): Payer: Self-pay

## 2024-04-18 ENCOUNTER — Institutional Professional Consult (permissible substitution) (INDEPENDENT_AMBULATORY_CARE_PROVIDER_SITE_OTHER): Admitting: Adult Health

## 2024-04-21 ENCOUNTER — Institutional Professional Consult (permissible substitution) (INDEPENDENT_AMBULATORY_CARE_PROVIDER_SITE_OTHER): Admitting: Family Medicine

## 2024-04-21 ENCOUNTER — Telehealth: Payer: Self-pay

## 2024-04-21 NOTE — Telephone Encounter (Signed)
 Spoke to pt she is needing xrays of her lower back and spine and knees I suggested that she ho to [facility] walk in clinic. Pt states that she will go

## 2024-04-21 NOTE — Telephone Encounter (Signed)
 Copied from CRM [id number]. Topic: General - Other >> Apr 21, 2024  3:52 PM Thersia BROCKS wrote: Reason for CRM: Patient called in wanting to speak NP Rollene [facility] nurse would like a callback

## 2024-04-28 ENCOUNTER — Ambulatory Visit: Admitting: Sleep Medicine

## 2024-04-28 ENCOUNTER — Encounter: Payer: Self-pay | Admitting: Sleep Medicine

## 2024-05-16 ENCOUNTER — Ambulatory Visit: Payer: Self-pay | Admitting: *Deleted

## 2024-05-16 NOTE — Telephone Encounter (Signed)
 Spoke to pt she has swelling and feels bloated she is going to ED

## 2024-05-16 NOTE — Telephone Encounter (Signed)
 FYI Only or Action Required?: FYI only for provider.  Patient was last seen in primary care on 02/24/2024 by Dineen Rollene MATSU, FNP.  Called Nurse Triage reporting edema (Whole body swelling) and Leg Swelling.  Symptoms began several days ago.  Interventions attempted: Rest, hydration, or home remedies.  Symptoms are: gradually worsening.  Triage Disposition: Go to ED Now (Notify PCP)  Patient/caregiver understands and will follow disposition?: yes  Reason for Disposition  [1] Can't walk or can barely walk AND [2] new-onset  Answer Assessment - Initial Assessment Questions 1. ONSET: When did the swelling start? (e.g., minutes, hours, days)     Friday am 2. LOCATION: What part of the leg is swollen?  Are both legs swollen or just one leg?     Bilateral- swelling up to the thigh 3. SEVERITY: How bad is the swelling? (e.g., localized; mild, moderate, severe)     Patient states she is having pain in back, knees are burning 4. REDNESS: Is there redness or signs of infection?     Bruising in the legs, knees swell 5. PAIN: Is the swelling painful to touch? If Yes, ask: How painful is it?   (Scale 1-10; mild, moderate or severe)     8/10- can barely walk  7. CAUSE: What do you think is causing the leg swelling?     Fluid  8. MEDICAL HISTORY: Do you have a history of blood clots (e.g., DVT), cancer, heart failure, kidney disease, or liver failure?     no 9. RECURRENT SYMPTOM: Have you had leg swelling before? If Yes, ask: When was the last time? What happened that time?     no 10. OTHER SYMPTOMS: Do you have any other symptoms? (e.g., chest pain, difficulty breathing)       no  Protocols used: Leg Swelling and Edema-A-AH   Copied from CRM #[id number]. Topic: Clinical - Red Word Triage >> May 16, 2024  9:46 AM Revonda D wrote: Red Word that prompted transfer to Nurse Triage: swelling   Pt stated that she is experiencing swelling all over her body and would  like to schedule an appt with PCP.

## 2024-05-17 ENCOUNTER — Ambulatory Visit: Payer: Self-pay

## 2024-05-17 ENCOUNTER — Encounter: Payer: Self-pay | Admitting: Student in an Organized Health Care Education/Training Program

## 2024-05-17 ENCOUNTER — Ambulatory Visit
Attending: Student in an Organized Health Care Education/Training Program | Admitting: Student in an Organized Health Care Education/Training Program

## 2024-05-17 VITALS — BP 132/63 | HR 83 | Temp 97.7°F | Resp 20 | Ht 63.0 in | Wt 259.0 lb

## 2024-05-17 DIAGNOSIS — G8929 Other chronic pain: Secondary | ICD-10-CM | POA: Diagnosis present

## 2024-05-17 DIAGNOSIS — M25562 Pain in left knee: Secondary | ICD-10-CM | POA: Diagnosis not present

## 2024-05-17 DIAGNOSIS — M5416 Radiculopathy, lumbar region: Secondary | ICD-10-CM | POA: Insufficient documentation

## 2024-05-17 DIAGNOSIS — G894 Chronic pain syndrome: Secondary | ICD-10-CM | POA: Insufficient documentation

## 2024-05-17 DIAGNOSIS — M170 Bilateral primary osteoarthritis of knee: Secondary | ICD-10-CM | POA: Diagnosis not present

## 2024-05-17 DIAGNOSIS — M4726 Other spondylosis with radiculopathy, lumbar region: Secondary | ICD-10-CM | POA: Diagnosis not present

## 2024-05-17 DIAGNOSIS — M25561 Pain in right knee: Secondary | ICD-10-CM | POA: Diagnosis present

## 2024-05-17 DIAGNOSIS — M47816 Spondylosis without myelopathy or radiculopathy, lumbar region: Secondary | ICD-10-CM | POA: Diagnosis present

## 2024-05-17 NOTE — Telephone Encounter (Signed)
 Pt scheduled to see Kacy on 05/20/2024

## 2024-05-17 NOTE — Progress Notes (Signed)
 PROVIDER NOTE: Interpretation of information contained herein should be left to medically-trained personnel. Specific patient instructions are provided elsewhere under Patient Instructions section of medical record. This document was created in part using AI and STT-dictation technology, any transcriptional errors that may result from this process are unintentional.  Patient: Heather Sweeney  Service: E/M Encounter  Provider: Wallie Sherry, MD  DOB: [birth year]  Delivery: Face-to-face  Specialty: Interventional Pain Management  MRN: [id number]  Setting: Ambulatory outpatient facility  Specialty designation: 09  Type: New Patient  Location: Outpatient office facility  PCP: Dineen Rollene MATSU, FNP  DOS: 05/17/2024    Referring Prov.: Arnett, Rollene MATSU, FNP   Primary Reason(s) for Visit: Encounter for initial evaluation of one or more chronic problems (new to examiner) potentially causing chronic pain, and posing a threat to normal musculoskeletal function. (Level of risk: High) CC: Back Pain (lower) and Knee Pain (right)  HPI  Heather Sweeney is a 58 y.o. year old, female patient, who comes for the first time to our practice referred by Dineen Rollene MATSU, FNP for our initial evaluation of her chronic pain. She has Essential hypertension; Bilateral carpal tunnel syndrome; Morbid obesity with BMI of 40.0-44.9, adult (HCC); OSA on CPAP; Angioedema; HLD (hyperlipidemia); Chronic pain; Allergic rhinitis; History of prediabetes; Mood disorder (HCC)--Bipolar; Vitamin D  deficiency; Mitral valve insufficiency; Gastroesophageal reflux disease; Bilateral chronic knee pain; Bilateral primary osteoarthritis of knee; Lumbosacral radiculitis; Insomnia; Intertrigo; Chronic idiopathic urticaria; Stage 3a chronic kidney disease (HCC); Panniculitis; Lymphedema; High serum vitamin B12; Smoking history; Enlarged clavicle; Anxiety and depression; H/O abdominoplasty 04/2023; H/O: hysterectomy 2012; Morbid obesity (HCC) BMI=42.9; Vaginal  bleeding; Other dysphagia; Encounter for screening mammogram for malignant neoplasm of breast; Vomiting; Lumbar facet arthropathy; and Chronic radicular lumbar pain on their problem list. Today she comes in for evaluation of her Back Pain (lower) and Knee Pain (right)  Pain Assessment: Location: Right Knee Radiating: denies Onset: More than a month ago Duration: Chronic pain Quality: Burning Severity: 10-Worst pain ever/10 (subjective, self-reported pain score)  Effect on ADL:   Timing: Constant Modifying factors: Belbuca , Percocet BP: 132/63  HR: 83  Onset and Duration: Present longer than 3 months Cause of pain: Unknown Severity: Getting worse Timing: Afternoon and Night Aggravating Factors: Kneeling and Lifiting Alleviating Factors: Cold packs, Resting, and Sleeping Associated Problems: Depression, Fatigue, Swelling, Tingling, and Weakness Quality of Pain: Burning Previous Examinations or Tests: MRI scan and X-rays Previous Treatments: The patient denies any previous treatments  Ms. Moravek is being evaluated for possible interventional pain management therapies for the treatment of her chronic pain.  Discussed the use of AI scribe software for clinical note transcription with the patient, who gave verbal consent to proceed.  History of Present Illness   Heather Sweeney is a 58 year old female who presents with chronic knee pain for pain management consultation. She was referred by her previous provider to consolidate her pain management care.  She has chronic knee pain and has previously received two gel injections approximately three to four weeks apart, which did not provide relief. She describes the sensation as 'the gel just sitting there.' She has also received a steroid injection in the past but reports ongoing pain in both knees.  She has tried [employer] patch at a dose of 20 mcg/hour and was recently transition to belbuca  150 mcg twice daily.  This is managed by Dr.  Lanis however she would like to transfer her care here and consolidate.  Her pain affects her ability  to walk, stating 'I can walk, you know, I come in here high for the day. I don't get out and walk this thing.' She mentions a fall at the beginning of the year, resulting in a 'crack' in her knee, possibly a small fracture, but it is not currently bothering her. She applies a topical treatment, referred to as 'V,' to her knee, but it has not alleviated the swelling.  During the review of symptoms, she confirms pain in both knees and describes a sensation of the pain radiating down her leg. No history of genicular nerve block.         Historic Controlled Substance Pharmacotherapy Review PMP and historical list of controlled substances:  04/27/2024 04/25/2024  1 Belbuca  150 Mcg Film 60.00 30 Ma Wun [id number] [facility] ([id number]) 0/0 0.30 mg Medicare [place]  04/14/2024 02/03/2024  1 Buprenorphine  20 Mcg/hr Patch 4.00 28 Ma Wun [id number] [facility] ([id number]) 2/2 0.48 mg Medicare [place]  03/07/2024 02/03/2024  1 Buprenorphine  20 Mcg/hr Patch 4.00 28 Ma Wun [id number] [facility] ([id number]) 1/2 0.48 mg Medicare [place]  02/05/2024 02/03/2024  1 Buprenorphine  20 Mcg/hr Patch 4.00 28 Ma Wun [id number] [facility] ([id number]) 0/2 0.48 mg Medicare [place]    Historical Monitoring: The patient  reports that she does not currently use drugs after having used the following drugs: Marijuana. List of prior UDS Testing: Lab Results  Component Value Date   MDMA NEGATIVE 04/04/2013   COCAINSCRNUR NEGATIVE 04/04/2013   PCPSCRNUR NEGATIVE 04/04/2013   THCU NEGATIVE 04/04/2013   Historical Background Evaluation: [place] PMP: PDMP reviewed during this encounter. Review of the past 59-months conducted.              Department of public safety, offender search: Engineer, mining Information) Non-contributory Risk Assessment Profile: Aberrant behavior: None observed or detected today Risk factors for fatal opioid overdose: None identified today Fatal overdose hazard ratio (HR):  Calculation deferred Non-fatal overdose hazard ratio (HR): Calculation deferred Risk of opioid abuse or dependence: 0.7-3.0% with doses <= 36 MME/day and 6.1-26% with doses >= 120 MME/day. Substance use disorder (SUD) risk level: See below Personal History of Substance Abuse (SUD-Substance use disorder):  Alcohol : Negative  Illegal Drugs: Negative  Rx Drugs: Negative  ORT Risk Level calculation: Low Risk  Opioid Risk Tool - 05/17/24 1405       Family History of Substance Abuse   Alcohol  Negative    Illegal Drugs Negative    Rx Drugs Negative      Personal History of Substance Abuse   Alcohol  Negative    Illegal Drugs Negative    Rx Drugs Negative      History of Preadolescent Sexual Abuse   History of Preadolescent Sexual Abuse Negative or Female      Psychological Disease   Psychological Disease Positive    ADD Negative    OCD Negative    Bipolar Positive    Schizophrenia Positive    Depression Positive      Total Score   Opioid Risk Tool Scoring 3    Opioid Risk Interpretation Low Risk         ORT Scoring interpretation table:  Score <3 = Low Risk for SUD  Score between 4-7 = Moderate Risk for SUD  Score >8 = High Risk for Opioid Abuse   PHQ-2 Depression Scale:  Total score: 0  PHQ-2 Scoring interpretation table: (Score and probability of major depressive disorder)  Score 0 = No depression  Score 1 = 15.4% Probability  Score 2 = 21.1%  Probability  Score 3 = 38.4% Probability  Score 4 = 45.5% Probability  Score 5 = 56.4% Probability  Score 6 = 78.6% Probability   PHQ-9 Depression Scale:  Total score: 0  PHQ-9 Scoring interpretation table:  Score 0-4 = No depression  Score 5-9 = Mild depression  Score 10-14 = Moderate depression  Score 15-19 = Moderately severe depression  Score 20-27 = Severe depression (2.4 times higher risk of SUD and 2.89 times higher risk of overuse)   Pharmacologic Plan: As per protocol, I have not taken over any controlled  substance management, pending the results of ordered tests and/or consults.            Initial impression: Pending review of available data and ordered tests.  Meds   Current Outpatient Medications:    ACCU-CHEK GUIDE test strip, Use as instructed, Disp: 100 strip, Rfl: 3   albuterol  (VENTOLIN  HFA) 108 (90 Base) MCG/ACT inhaler, Inhale 1-2 puffs into the lungs every 6 (six) hours as needed for wheezing or shortness of breath., Disp: 18 g, Rfl: 11   amLODipine  (NORVASC ) 5 MG tablet, TAKE ONE TABLET BY MOUTH ONCE EVERY DAY, Disp: 90 tablet, Rfl: 1   BELBUCA  150 MCG FILM, Take 150 mcg by mouth 2 (two) times daily., Disp: , Rfl:    benztropine  (COGENTIN ) 1 MG tablet, Take 1 mg by mouth 2 (two) times daily., Disp: , Rfl:    Blood Glucose Monitoring Suppl (ACCU-CHEK GUIDE ME) w/Device KIT, Use as directed,once daily to test blood sugar, Disp: 1 kit, Rfl: 0   buPROPion (WELLBUTRIN XL) 300 MG 24 hr tablet, Take 300 mg by mouth daily., Disp: , Rfl:    busPIRone (BUSPAR) 15 MG tablet, Take 15 mg by mouth 2 (two) times daily., Disp: , Rfl:    DULoxetine  (CYMBALTA ) 20 MG capsule, Take 1 capsule (20 mg total) by mouth daily., Disp: 90 capsule, Rfl: 3   EPINEPHrine  0.3 mg/0.3 mL IJ SOAJ injection, Inject 0.3 mg into the muscle as needed for anaphylaxis., Disp: 2 each, Rfl: 3   famotidine  (PEPCID ) 40 MG tablet, TAKE ONE TABLET BY MOUTH ONCE EVERY DAY, Disp: 120 tablet, Rfl: 11   FLUoxetine  (PROZAC ) 40 MG capsule, Take 40 mg by mouth daily., Disp: , Rfl:    fluPHENAZine  (PROLIXIN ) 5 MG tablet, Take 5 mg by mouth 2 (two) times daily., Disp: , Rfl:    fluticasone  (FLONASE ) 50 MCG/ACT nasal spray, INSTILL 2 SPRAYS IN EACH NOSTRIL ONCE DAILY, Disp: 16 g, Rfl: 10   metoprolol  succinate (TOPROL -XL) 25 MG 24 hr tablet, Take 25 mg by mouth daily., Disp: , Rfl:    Multiple Vitamins-Minerals (ALIVE MULTI-VITAMIN PO), Take by mouth daily in the afternoon., Disp: , Rfl:    omeprazole  (PRILOSEC) 40 MG capsule, TAKE 1  CAPSULE BY MOUTH ONCE EVERY DAY, Disp: 120 capsule, Rfl: 11   ondansetron  (ZOFRAN -ODT) 4 MG disintegrating tablet, Take 1 tablet (4 mg total) by mouth every 8 (eight) hours as needed for nausea or vomiting., Disp: 20 tablet, Rfl: 0   topiramate  (TOPAMAX ) 25 MG tablet, Take 1 tablet (25 mg total) by mouth at bedtime., Disp: 90 tablet, Rfl: 3   ARIPiprazole ER (ABILIFY MAINTENA) 300 MG SRER injection, as directed Intramuscular every two weeks, Disp: , Rfl:    buprenorphine  (BUTRANS) 15 MCG/HR, 1 patch once a week. (Patient not taking: Reported on 05/17/2024), Disp: , Rfl:    hydrOXYzine  (ATARAX ) 50 MG tablet, Oral, Disp: , Rfl:    Insulin  Pen Needle (  NOVOFINE) 30G X 8 MM MISC, Use with victoza  daily 1.8 mg (Patient not taking: Reported on 05/17/2024), Disp: 90 each, Rfl: 3   lidocaine  (LIDODERM ) 5 %, Place 1 patch onto the skin every 12 (twelve) hours. Remove & Discard patch within 12 hours or as directed by MD.  Ginnie the patch off for 12 hours before applying a new one. (Patient not taking: Reported on 05/17/2024), Disp: 30 patch, Rfl: 0   omalizumab  (XOLAIR ) 150 MG/ML prefilled syringe, , Disp: , Rfl:   Imaging Review   MR Lumbar Spine Wo Contrast  Narrative CLINICAL DATA:  Chronic low back pain. Patient fell in 2017 and has persistent low back and buttock pain with right leg pain. No previous relevant surgery.  EXAM: MRI LUMBAR SPINE WITHOUT CONTRAST  TECHNIQUE: Multiplanar, multisequence MR imaging of the lumbar spine was performed. No intravenous contrast was administered.  COMPARISON:  Lumbar MRI 02/28/2021.  Radiographs 02/07/2015.  FINDINGS: Segmentation: Conventional anatomy assumed, with the last open disc space designated L5-S1.Concordant with prior imaging.  Alignment:  Minimal degenerative anterolisthesis at L4-5.  Vertebrae: No worrisome osseous lesion, acute fracture or pars defect. The visualized sacroiliac joints appear unremarkable.  Conus medullaris: Extends to the  T12-L1 level. The conus and cauda equina appear normal. There is mildly progressive epidural fat in the lower lumbar spinal canal.  Paraspinal and other soft tissues: No significant paraspinal findings.  Disc levels:  Sagittal images demonstrate no significant disc space findings within the visualized lower thoracic spine.  L1-2: Preserved disc height with mild disc bulging. No spinal stenosis or nerve root encroachment.  L2-3: Mildly progressive loss of disc height with progressive annular disc bulging. Mild facet and ligamentous hypertrophy. No significant spinal stenosis or nerve root encroachment.  L3-4: Preserved disc height with mild disc bulging. Mildly progressive bilateral facet hypertrophy. No spinal stenosis or nerve root encroachment.  L4-5: Preserved disc height with mild disc bulging. Progressive moderate facet hypertrophy with new mild multifactorial spinal stenosis. Stable minimal foraminal narrowing.  L5-S1: Progressive loss of disc height with annular disc bulging and a broad-based central disc protrusion. Mild bilateral facet hypertrophy. Combined with increased epidural fat, there is resulting mild multifactorial spinal stenosis with mild to moderate foraminal narrowing bilaterally.  IMPRESSION: 1. Mildly progressive spondylosis at L2-3 and L3-4 without resulting spinal stenosis or nerve root encroachment. 2. Progressive facet hypertrophy at L4-5 with new mild multifactorial spinal stenosis. 3. Progressive disc degeneration at L5-S1 with resulting mild multifactorial spinal stenosis and mild to moderate foraminal narrowing bilaterally. 4. No acute findings.  Mildly progressive lower lumbar epidural fat.   Electronically Signed By: Elsie Perone M.D. On: 11/08/2023 12:35    DG Lumbar Spine Complete  Narrative CLINICAL DATA:  Low back pain for 5 months  EXAM: LUMBAR SPINE - COMPLETE 4+ VIEW  COMPARISON:  Lumbar spine films of  09/09/2010  FINDINGS: The lumbar vertebrae are in normal alignment. Anterior osteophyte formation is present at L2-3, L3-4, and L4-5 levels, but intervertebral disc spaces are relatively well preserved. No compression deformity is seen. The SI joints are corticated. On the frontal view there is a small calcification to the left of midline between the transverse processes of L4 on L5 which could conceivably represent a small mid left ureteral calculus. Clinical correlation is recommended.  IMPRESSION: 1. Normal alignment with mild degenerative change. Intervertebral disc spaces are unremarkable. 2. Cannot exclude mid left ureteral calculus.   Electronically Signed By: Deward Dames M.D. On: 02/07/2015 11:58  DG  Hip Unilat W or Wo Pelvis 2-3 Views Right  Narrative CLINICAL DATA:  No known injury.  Pain for 1 week.  EXAM: DG HIP (WITH OR WITHOUT PELVIS) 2-3V RIGHT  COMPARISON:  None.  FINDINGS: There is no evidence of hip fracture or dislocation. There is no evidence of arthropathy or other focal bone abnormality.  IMPRESSION: Negative.   Electronically Signed By: Norleen ONEIDA Catching M.D. On: 01/30/2018 08:27   Narrative CLINICAL DATA:  Right knee pain for 38 years. Progressively worsening.  EXAM: MRI OF THE RIGHT KNEE WITHOUT CONTRAST  TECHNIQUE: Multiplanar, multisequence MR imaging of the knee was performed. No intravenous contrast was administered.  COMPARISON:  03/01/2020  FINDINGS: MENISCI  Medial: Large radial tear of the posterior horn of the medial meniscus adjacent to the meniscal root with peripheral meniscal extrusion.  Lateral: Intact.  LIGAMENTS  Cruciates: Intact ACL and PCL. ACL is expanded and increased in signal consistent with mucinous degeneration with subcortical reactive marrow edema at the ACL insertion.  Collaterals: Medial collateral ligament is intact. Lateral collateral ligament complex is intact.  CARTILAGE  Patellofemoral:  Partial-thickness cartilage loss of the patellofemoral compartment with areas of high-grade partial cartilage loss of the medial patellofemoral compartment.  Medial: Extensive full-thickness cartilage loss of the medial femorotibial compartment with subchondral reactive marrow edema and marginal osteophytes.  Lateral: Partial-thickness cartilage loss of the lateral femorotibial compartment with a full-thickness cartilage fissure in the lateral tibial plateau. Small marginal osteophytes.  JOINT: Large joint effusion. Edema in Hoffa's fat. No plical thickening.  POPLITEAL FOSSA: Popliteus tendon is intact. No Baker's cyst.  EXTENSOR MECHANISM: Intact quadriceps tendon. Intact patellar tendon. Intact lateral patellar retinaculum. Intact medial patellar retinaculum. Intact MPFL.  BONES: No aggressive osseous lesion. No fracture or dislocation. Mild osteoarthritis of the proximal tibia-fibular joint.  Other: No fluid collection or hematoma. Muscles are normal.  IMPRESSION: 1. Large radial tear of the posterior horn of the medial meniscus adjacent to the meniscal root with peripheral meniscal extrusion. 2. Tricompartmental cartilage abnormalities as described above most severe in the medial femorotibial compartment consistent with severe osteoarthritis. 3. Large joint effusion.   Electronically Signed By: Julaine Blanch M.D. On: 07/24/2023 06:43   Narrative CLINICAL DATA:  Status post fall. Osteoarthritis, localized, primary, knee.  EXAM: RIGHT KNEE - 1-2 VIEW  COMPARISON:  Radiograph 01/04/2021  FINDINGS: No acute or healing fracture. No dislocation. Medial tibiofemoral joint space narrowing. Developing subchondral sclerosis in the medial tibiofemoral compartment, degenerative. Moderate tricompartmental peripheral spurring. Degenerative changes have progressed from prior exam. There is a small knee joint effusion. No erosive change.  IMPRESSION: 1. No acute or  healing fracture. 2. Tricompartmental osteoarthritis, progressed from April 2022.   Electronically Signed By: Andrea Gasman M.D. On: 05/12/2023 10:37   Narrative CLINICAL DATA:  Fall.  Left knee pain.  EXAM: LEFT KNEE - 1-2 VIEW  COMPARISON:  Left knee radiographs 06/12/2010  FINDINGS: Normal bone mineralization. Minimal medial compartment joint space narrowing is minimally worsened from remote 06/12/2010 prior. Mild peripheral medial and lateral compartment degenerative osteophytosis. Moderate patellofemoral joint space narrowing and peripheral osteophytosis.  Moderate chronic enthesopathic change at the quadriceps insertion on the patella. No joint effusion.  Previously there was a 4 mm loose body overlying the posterior knee medial compartment. In this region on lateral view there appears to be a 10 mm possible loose body. No acute fracture or dislocation.  IMPRESSION: 1. Moderate patellofemoral and mild medial compartment osteoarthritis. 2. Possible 10 mm loose body within  the posterior knee medial compartment. 3. Moderate chronic enthesopathic change at the quadriceps insertion on the patella.   Electronically Signed By: Tanda Lyons M.D. On: 02/11/2023 09:01    Narrative CLINICAL DATA:  Pain to posterior knee  EXAM: RIGHT KNEE - COMPLETE 4+ VIEW  COMPARISON:  MRI 03/01/2020  FINDINGS: No fracture or malalignment. Tricompartment arthritis with moderate joint space narrowing of the medial tibiofemoral joint space. Possible small knee effusion.  IMPRESSION: Tricompartment arthritis with probable small knee effusion.   Electronically Signed By: Luke Bun M.D. On: 01/04/2021 17:29   Complexity Note: Imaging results reviewed.                         ROS  Cardiovascular: No reported cardiovascular signs or symptoms such as High blood pressure, coronary artery disease, abnormal heart rate or rhythm, heart attack, blood thinner therapy or  heart weakness and/or failure Pulmonary or Respiratory: Difficulty blowing air out (Emphysema) Neurological: Seizure disorder Psychological-Psychiatric: Anxiousness, Depressed, and Prone to panicking Gastrointestinal: No reported gastrointestinal signs or symptoms such as vomiting or evacuating blood, reflux, heartburn, alternating episodes of diarrhea and constipation, inflamed or scarred liver, or pancreas or irrregular and/or infrequent bowel movements Genitourinary: No reported renal or genitourinary signs or symptoms such as difficulty voiding or producing urine, peeing blood, non-functioning kidney, kidney stones, difficulty emptying the bladder, difficulty controlling the flow of urine, or chronic kidney disease Hematological: No reported hematological signs or symptoms such as prolonged bleeding, low or poor functioning platelets, bruising or bleeding easily, hereditary bleeding problems, low energy levels due to low hemoglobin or being anemic Endocrine: No reported endocrine signs or symptoms such as high or low blood sugar, rapid heart rate due to high thyroid  levels, obesity or weight gain due to slow thyroid  or thyroid  disease Rheumatologic: No reported rheumatological signs and symptoms such as fatigue, joint pain, tenderness, swelling, redness, heat, stiffness, decreased range of motion, with or without associated rash Musculoskeletal: Negative for myasthenia gravis, muscular dystrophy, multiple sclerosis or malignant hyperthermia   Allergies  Ms. Gartland is allergic to hydrochlorothiazide , keflex [cephalexin], lisinopril, losartan , penicillins, bee venom, and metformin .  Laboratory Chemistry Profile   Renal Lab Results  Component Value Date   BUN 11 02/24/2024   CREATININE 1.12 02/24/2024   LABCREA 56 02/24/2019   BCR 6 (L) 03/04/2022   GFR 55.28 (L) 02/24/2024   GFRAA >60 01/06/2018   GFRNONAA >60 11/06/2023   SPECGRAV 1.020 10/23/2023   PHUR 7.5 10/23/2023   PROTEINUR  Negative 10/23/2023     Electrolytes Lab Results  Component Value Date   NA 137 02/24/2024   K 4.1 02/24/2024   CL 100 02/24/2024   CALCIUM 9.7 02/24/2024     Hepatic Lab Results  Component Value Date   AST 13 02/24/2024   ALT 17 02/24/2024   ALBUMIN 4.5 02/24/2024   ALKPHOS 93 02/24/2024     ID Lab Results  Component Value Date   HIV Non Reactive 07/07/2017   SARSCOV2NAA NEGATIVE 10/19/2020     Bone Lab Results  Component Value Date   VD25OH 29.74 (L) 09/22/2023     Endocrine Lab Results  Component Value Date   GLUCOSE 91 02/24/2024   GLUCOSEU NEGATIVE 10/23/2023   HGBA1C 5.3 09/22/2023   TSH 1.21 09/22/2023     Neuropathy Lab Results  Component Value Date   VITAMINB12 445 10/23/2023   FOLATE 5.5 (L) 10/23/2023   HGBA1C 5.3 09/22/2023   HIV Non  Reactive 07/07/2017     CNS No results found for: COLORCSF, APPEARCSF, RBCCOUNTCSF, WBCCSF, POLYSCSF, LYMPHSCSF, EOSCSF, PROTEINCSF, GLUCCSF, JCVIRUS, CSFOLI, IGGCSF, LABACHR, ACETBL   Inflammation (CRP: Acute  ESR: Chronic) Lab Results  Component Value Date   CRP <1.0 02/24/2019   ESRSEDRATE 22 02/24/2019     Rheumatology Lab Results  Component Value Date   ANA NEGATIVE 02/24/2019     Coagulation Lab Results  Component Value Date   PLT 210.0 02/24/2024     Cardiovascular Lab Results  Component Value Date   BNP 7.0 07/11/2016   CKTOTAL 102 04/04/2013   CKMB < 0.5 (L) 04/04/2013   TROPONINI <0.03 07/07/2017   HGB 13.4 02/24/2024   HCT 39.8 02/24/2024     Screening Lab Results  Component Value Date   SARSCOV2NAA NEGATIVE 10/19/2020   HIV Non Reactive 07/07/2017     Cancer No results found for: CEA, CA125, LABCA2   Allergens No results found for: ALMOND, APPLE, ASPARAGUS, AVOCADO, BANANA, BARLEY, BASIL, BAYLEAF, GREENBEAN, LIMABEAN, WHITEBEAN, BEEFIGE, REDBEET, BLUEBERRY, BROCCOLI, CABBAGE, MELON, CARROT, CASEIN,  CASHEWNUT, CAULIFLOWER, CELERY     Note: Lab results reviewed.  PFSH  Drug: Ms. Ebbert  reports that she does not currently use drugs after having used the following drugs: Marijuana. Alcohol :  reports that she does not currently use alcohol . Tobacco:  reports that she has quit smoking. Her smoking use included e-cigarettes. Her smokeless tobacco use includes chew. 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), Arthritis of knee, right, Arthritis of right knee (10/25/2020), Asthma, Bacterial vaginosis (10/20/2019), Bipolar disorder (HCC), BMI 37.0-37.9, adult (03/07/2022), BMI 40.0-44.9, adult (HCC) (06/17/2022), Bronchiectasis without complication (HCC) (04/04/2022), Chronic low back pain, Chronic midline low back pain (12/17/2021), Class 3 severe obesity due to excess calories in adult (04/09/2022), CTS (carpal tunnel syndrome), Depression, Diabetes mellitus without complication (HCC), Early satiety (02/11/2022), Elevated liver enzymes (08/17/2020), Encounter for screening colonoscopy, Encounter for screening for COVID-19 (10/10/2022), Frequent PVCs (09/24/2016), Gallstones, GERD (gastroesophageal reflux disease), History of prediabetes, Hives (02/23/2019), Hypertension, Hypokalemia (12/13/2021), Incontinence of feces (02/12/2021), Ingrown right big toenail (04/20/2020), Iron deficiency anemia (12/13/2021), Laceration of right foot, Lymphedema (08/24/2022), Obesity (BMI 30-39.9) (09/10/2021), Onychomycosis, OSA on CPAP, Osteoarthritis of knee (12/17/2021), Overactive bladder, Pain and swelling of right knee (04/20/2020), PVC's (premature ventricular contractions), PVC's (premature ventricular contractions), S/P laparoscopic cholecystectomy April 2019 (01/12/2018), Seizures (HCC), Tachycardia (10/10/2022), Urticaria  (02/23/2019), and Weight loss (02/11/2022). Family: family history includes Asthma in her son; Breast cancer (age of onset: 92) in her sister; Cancer in her paternal aunt; Cancer (age of onset: 78) in her father; Early death (age of onset: 6) in her mother; Heart disease (age of onset: 51) in her mother; Heart failure in her mother and sister; Hypertension in her father and mother; Mental illness in her sister.  Past Surgical History:  Procedure Laterality Date   ABDOMINAL HYSTERECTOMY  07/08/2011   CARPAL TUNNEL RELEASE     right hand 1996   CESAREAN SECTION     2012   CHOLECYSTECTOMY N/A 01/12/2018   Procedure: LAPAROSCOPIC CHOLECYSTECTOMY WITH INTRAOPERATIVE CHOLANGIOGRAM ERAS PATHWAY;  Surgeon: Gladis Cough, MD;  Location: [facility];  Service: General;  Laterality: N/A;   COLONOSCOPY WITH PROPOFOL  N/A 04/15/2019   Procedure: COLONOSCOPY WITH PROPOFOL ;  Surgeon: Therisa Bi, MD;  Location: [facility] ENDOSCOPY;  Service: Gastroenterology;  Laterality: N/A;   DILATION AND CURETTAGE  OF UTERUS     04/2011 benign endometrial polyp squamous metaplasia, inflamm, blood, mucous, benign    ESOPHAGOGASTRODUODENOSCOPY (EGD) WITH PROPOFOL  N/A 10/23/2020   Procedure: ESOPHAGOGASTRODUODENOSCOPY (EGD) WITH PROPOFOL ;  Surgeon: Therisa Bi, MD;  Location: [facility] ENDOSCOPY;  Service: Gastroenterology;  Laterality: N/A;   ESOPHAGOGASTRODUODENOSCOPY (EGD) WITH PROPOFOL  N/A 11/05/2023   Procedure: ESOPHAGOGASTRODUODENOSCOPY (EGD) WITH PROPOFOL ;  Surgeon: Therisa Bi, MD;  Location: [facility] ENDOSCOPY;  Service: Gastroenterology;  Laterality: N/A;   FLEXIBLE SIGMOIDOSCOPY N/A 02/15/2021   Procedure: FLEXIBLE SIGMOIDOSCOPY;  Surgeon: Therisa Bi, MD;  Location: [facility] ENDOSCOPY;  Service: Gastroenterology;  Laterality: N/A;   FOOT SURGERY     Dr. Loreda    LEEP     2006   PANNICULECTOMY N/A 04/24/2022   Procedure: INFRAUMBILICAL PANNICULECTOMY;  Surgeon: Lowery Estefana RAMAN, DO;  Location: [facility] OR;  Service: Plastics;   Laterality: N/A;   Right hand carpal tunnel release Right 10/2022   scar tissue repair  04/2023   scar tissue from having children and tummy tuck   TUBAL LIGATION     Active Ambulatory Problems    Diagnosis Date Noted   Essential hypertension 07/10/2016   Bilateral carpal tunnel syndrome 11/03/2016   Morbid obesity with BMI of 40.0-44.9, adult (HCC) 08/07/2016   OSA on CPAP 07/10/2016   Angioedema 07/07/2017   HLD (hyperlipidemia) 09/01/2018   Chronic pain 08/31/2018   Allergic rhinitis 08/31/2018   History of prediabetes 09/01/2018   Mood disorder (HCC)--Bipolar 12/24/2018   Vitamin D  deficiency 02/01/2019   Mitral valve insufficiency 01/10/2020   Gastroesophageal reflux disease 04/20/2020   Bilateral chronic knee pain 04/20/2020   Bilateral primary osteoarthritis of knee 06/08/2020   Lumbosacral radiculitis 02/19/2018   Insomnia 10/11/2020   Intertrigo 02/12/2021   Chronic idiopathic urticaria 12/12/2021   Stage 3a chronic kidney disease (HCC) 03/16/2022   Panniculitis 04/22/2022   Lymphedema 08/24/2022   High serum vitamin B12 10/25/2022   Smoking history 11/05/2022   Enlarged clavicle 09/22/2023   Anxiety and depression 09/22/2023   H/O abdominoplasty 04/2023 10/15/2023   H/O: hysterectomy 2012 10/15/2023   Morbid obesity (HCC) BMI=42.9 10/15/2023   Vaginal bleeding 10/19/2023   Other dysphagia 11/05/2023   Encounter for screening mammogram for malignant neoplasm of breast 01/29/2024   Vomiting 02/24/2024   Lumbar facet arthropathy 05/17/2024   Chronic radicular lumbar pain 05/17/2024   Resolved Ambulatory Problems    Diagnosis Date Noted   Chest pain 07/11/2016   Frequent PVCs 09/24/2016   Palpitations 08/07/2016   S/P laparoscopic cholecystectomy April 2019 01/12/2018   Morbid obesity (HCC) 09/01/2018   Hives 02/23/2019   Encounter for screening colonoscopy    Bacterial vaginosis 10/20/2019   Annual physical exam 04/20/2020   Ingrown right big toenail  04/20/2020   Prediabetes 07/24/2020   Elevated liver enzymes 08/17/2020   Pilar cyst 10/11/2020   Arthritis of right knee 10/25/2020   Aperistalsis, esophagus 10/25/2020   Abnormal MRI, lumbar spine 02/12/2021   Incontinence of feces 02/12/2021   Urticaria 02/23/2019   Obesity (BMI 30-39.9) 09/10/2021   Iron deficiency anemia 12/13/2021   Hypokalemia 12/13/2021   Chronic midline low back pain 12/17/2021   Weight loss 02/11/2022   Early satiety 02/11/2022   Osteoarthritis of knee 12/17/2021   BMI 37.0-37.9, adult 03/07/2022   Bronchiectasis without complication (HCC) 04/04/2022   Class 3 severe obesity due to excess calories in adult 04/09/2022   BMI 40.0-44.9, adult (HCC) 06/17/2022   Abrasion of anterior left lower leg 07/10/2022   Encounter  for screening for COVID-19 10/10/2022   Tachycardia 10/10/2022   Past Medical History:  Diagnosis Date   Anxiety    Arthritis of knee, right    Asthma    Bipolar disorder (HCC)    Chronic low back pain    CTS (carpal tunnel syndrome)    Depression    Diabetes mellitus without complication (HCC)    Gallstones    GERD (gastroesophageal reflux disease)    Hypertension    Laceration of right foot    Onychomycosis    Overactive bladder    Pain and swelling of right knee 04/20/2020   PVC's (premature ventricular contractions)    PVC's (premature ventricular contractions)    Seizures (HCC)    Constitutional Exam  General appearance: Well nourished, well developed, and well hydrated. In no apparent acute distress Vitals:   05/17/24 1356  BP: 132/63  Pulse: 83  Resp: 20  Temp: 97.7 F (36.5 C)  TempSrc: Temporal  SpO2: 100%  Weight: 259 lb (117.5 kg)  Height: 5' 3 (1.6 m)   BMI Assessment: Estimated body mass index is 45.88 kg/m as calculated from the following:   Height as of this encounter: 5' 3 (1.6 m).   Weight as of this encounter: 259 lb (117.5 kg).  BMI interpretation table: BMI level Category Range association  with higher incidence of chronic pain  <18 kg/m2 Underweight   18.5-24.9 kg/m2 Ideal body weight   25-29.9 kg/m2 Overweight Increased incidence by 20%  30-34.9 kg/m2 Obese (Class I) Increased incidence by 68%  35-39.9 kg/m2 Severe obesity (Class II) Increased incidence by 136%  >40 kg/m2 Extreme obesity (Class III) Increased incidence by 254%   Patient's current BMI Ideal Body weight  Body mass index is 45.88 kg/m. Ideal body weight: 52.4 kg (115 lb 8.3 oz) Adjusted ideal body weight: 78.4 kg (172 lb 14.6 oz)   BMI Readings from Last 4 Encounters:  05/17/24 45.88 kg/m  04/05/24 42.37 kg/m  02/24/24 39.72 kg/m  01/29/24 40.41 kg/m   Wt Readings from Last 4 Encounters:  05/17/24 259 lb (117.5 kg)  04/05/24 239 lb 3.2 oz (108.5 kg)  02/24/24 231 lb 6.4 oz (105 kg)  01/29/24 228 lb 2 oz (103.5 kg)    Psych/Mental status: Alert, oriented x 3 (person, place, & time)       Eyes: PERLA Respiratory: No evidence of acute respiratory distress  Lower Extremity Exam    Side: Right lower extremity  Side: Left lower extremity  Stability: No instability observed          Stability: No instability observed          Skin & Extremity Inspection: Skin color, temperature, and hair growth are WNL. No peripheral edema or cyanosis. No masses, redness, swelling, asymmetry, or associated skin lesions. No contractures.  Skin & Extremity Inspection: Skin color, temperature, and hair growth are WNL. No peripheral edema or cyanosis. No masses, redness, swelling, asymmetry, or associated skin lesions. No contractures.  Functional ROM: Pain restricted ROM for hip and knee joints          Functional ROM: Pain restricted ROM for hip and knee joints          Muscle Tone/Strength: Functionally intact. No obvious neuro-muscular anomalies detected.  Muscle Tone/Strength: Functionally intact. No obvious neuro-muscular anomalies detected.  Sensory (Neurological): Arthropathic arthralgia        Sensory  (Neurological): Arthropathic arthralgia        DTR: Patellar: deferred today Achilles: deferred today Plantar:  deferred today  DTR: Patellar: deferred today Achilles: deferred today Plantar: deferred today  Palpation: No palpable anomalies  Palpation: No palpable anomalies    Assessment  Primary Diagnosis & Pertinent Problem List: The primary encounter diagnosis was Bilateral primary osteoarthritis of knee. Diagnoses of Bilateral chronic knee pain, Lumbar spondylosis, Lumbar facet arthropathy, Chronic radicular lumbar pain, and Chronic pain syndrome were also pertinent to this visit.  Visit Diagnosis (New problems to examiner): 1. Bilateral primary osteoarthritis of knee   2. Bilateral chronic knee pain   3. Lumbar spondylosis   4. Lumbar facet arthropathy   5. Chronic radicular lumbar pain   6. Chronic pain syndrome    Plan of Care (Initial workup plan)   I had extensive discussion with the patient about the goals of pain management.  We discussed nonpharmacological approaches to pain management that include physical therapy, dieting, sleep hygiene, psychotherapy, interventional therapy.  We discussed the importance of understanding the type of pain including neuropathic, nociceptive, centralized.  I also stressed the importance of multimodal analgesia with an emphasis on nondrug modalities including self management, behavioral health support and physical therapy.  We discussed the importance of physical therapy and how a individualized physical therapy and occupational therapy program tailored to patient limitations can be helpful at improving physical function. We also discussed the importance of insomnia and disrupted sleep and how improved sleep hygiene and cognitive therapy could be helpful.  Psychotherapy including CBT, mind-body therapies, pain coping strategies can be helpful for patients whose pain impacts mood, sleep, quality of life, relationships with others.  We discussed  avoiding benzodiazepines.  I also had an extensive discussion with the patient about interventional therapies which is my expertise and how these could be incorporated into an effective multimodal pain management plan.   Note: Ms. Janes was reminded that as per protocol, today's visit has been an evaluation only. We have not taken over the patient's controlled substance management.  Problem-specific plan: Assessment and Plan    Chronic bilateral knee pain related to knee osteoarthritis Chronic bilateral knee pain persists despite previous gel and steroid injections. Swelling suggests arthritis and inflammation. A genicular nerve block is planned in two weeks as both a diagnostic and therapeutic measure, with nerve ablation considered if significant relief occurs. Prescribe Valium  for relaxation during the procedure and arrange for a driver. Consider a referral to a home therapist.  Chronic back pain   Chronic back pain is currently managed albeit suboptimally, with belbuca  150 mcg twice daily.  We discussed a future dose titration to Belbuca  film 300 mcg twice daily for better pain management. Avoid Percocet, preferring utilizing a mixed opioid agonist/antagonist for chronic pain management. Obtain a urine specimen for medication management.  I will review this with her at the next visit, have her sign a controlled substance agreement and then consider taking over her belbuca .  I advised against any control to opioid analgesics including Percocet, hydrocodone .  She has obesity and history of obstructive sleep apnea.   1. Bilateral primary osteoarthritis of knee (Primary) - GENICULAR NERVE BLOCK; Future - Ambulatory referral to Physical Therapy  2. Bilateral chronic knee pain - GENICULAR NERVE BLOCK; Future - Ambulatory referral to Physical Therapy  3. Lumbar spondylosis - Ambulatory referral to Physical Therapy  4. Lumbar facet arthropathy - Ambulatory referral to Physical Therapy  5.  Chronic radicular lumbar pain - Ambulatory referral to Physical Therapy  6. Chronic pain syndrome - Compliance Drug Analysis, Ur - Ambulatory referral to Physical Therapy  Lab Orders         Compliance Drug Analysis, Ur     Referral Orders         Ambulatory referral to Physical Therapy    Procedure Orders         GENICULAR NERVE BLOCK      Provider-requested follow-up: Return in about 15 days (around 06/01/2024) for B/L GNB (and Belbuca  300 mcg BID), in clinic (PO Valium  5mg ).  Future Appointments  Date Time Provider Department Center  05/20/2024 11:00 AM Gretel App, NP [facility]  05/23/2024 10:20 AM [facility] PROVIDER [facility] None  05/30/2024 10:30 AM Dineen Rollene MATSU, FNP [facility]  06/01/2024  2:30 PM Marcelino Nurse, MD [facility] None  09/27/2024 11:30 AM [facility] ANNUAL WELLNESS VISIT [facility]  10/07/2024 12:15 PM Dillingham, Estefana RAMAN, DO [facility] None  10/10/2024 11:30 AM Schnier, Cordella MATSU, MD [facility] None   I discussed the assessment and treatment plan with the patient. The patient was provided an opportunity to ask questions and all were answered. The patient agreed with the plan and demonstrated an understanding of the instructions.  Patient advised to call back or seek an in-person evaluation if the symptoms or condition worsens.  Duration of encounter: .  Total time on encounter, as per AMA guidelines included both the face-to-face and non-face-to-face time personally spent by the physician and/or other qualified health care professional(s) on the day of the encounter (includes time in activities that require the physician or other qualified health care professional and does not include time in activities normally performed by clinical staff). Physician's time may include the following activities when performed: Preparing to see the patient (e.g., pre-charting review of records, searching for previously ordered imaging, lab work, and nerve  conduction tests) Review of prior analgesic pharmacotherapies. Reviewing PMP Interpreting ordered tests (e.g., lab work, imaging, nerve conduction tests) Performing post-procedure evaluations, including interpretation of diagnostic procedures Obtaining and/or reviewing separately obtained history Performing a medically appropriate examination and/or evaluation Counseling and educating the patient/family/caregiver Ordering medications, tests, or procedures Referring and communicating with other health care professionals (when not separately reported) Documenting clinical information in the electronic or other health record Independently interpreting results (not separately reported) and communicating results to the patient/ family/caregiver Care coordination (not separately reported)  Note by: Nurse Marcelino, MD (TTS and AI technology used. I apologize for any typographical errors that were not detected and corrected.) Date: 05/17/2024; Time: 3:22 PM

## 2024-05-17 NOTE — Telephone Encounter (Signed)
 FYI Only or Action Required?: Action required by provider: request for appointment.  Patient was last seen in primary care on 02/24/2024 by Heather Rollene MATSU, FNP.  Called Nurse Triage reporting swelling to legs.  Symptoms began several weeks ago.  Interventions attempted: Rest, hydration, or home remedies.  Symptoms are: unchanged.[name] Did not go to ED yesterday, nurse with [facility] Call states pt. Has +1 pitting edema to knees, no SOB, VS stable. No chest pain. Has been like this awhile. States pt. Is stable.   Triage Disposition: See Physician Within 24 Hours  Patient/caregiver understands and will follow disposition?: Yes   Copied from CRM #[id number]. Topic: Clinical - Red Word Triage >> May 17, 2024 12:38 PM [name] wrote: Red Word that prompted transfer to Nurse Triage: [name] called in wanting to speak with NT. Reason for Disposition  [1] MODERATE leg swelling (e.g., swelling extends up to knees) AND [2] new-onset or getting worse  Answer Assessment - Initial Assessment Questions 1. ONSET: When did the swelling start? (e.g., minutes, hours, days)     unsure 2. LOCATION: What part of the leg is swollen?  Are both legs swollen or just one leg?     Both legs 3. SEVERITY: How bad is the swelling? (e.g., localized; mild, moderate, severe)     Past knees 4. REDNESS: Is there redness or signs of infection?     no 5. PAIN: Is the swelling painful to touch? If Yes, ask: How painful is it?   (Scale 1-10; mild, moderate or severe)     moderate 6. FEVER: Do you have a fever? If Yes, ask: What is it, how was it measured, and when did it start?      no 7. CAUSE: What do you think is causing the leg swelling?     unsure 8. MEDICAL HISTORY: Do you have a history of blood clots (e.g., DVT), cancer, heart failure, kidney disease, or liver failure?     o 9. RECURRENT SYMPTOM: Have you had leg swelling before? If Yes, ask: When was the last  time? What happened that time?     yes 10. OTHER SYMPTOMS: Do you have any other symptoms? (e.g., chest pain, difficulty breathing)       none 11. PREGNANCY: Is there any chance you are pregnant? When was your last menstrual period?       no  Protocols used: Leg Swelling and Edema-A-AH

## 2024-05-17 NOTE — Patient Instructions (Addendum)

## 2024-05-17 NOTE — Progress Notes (Signed)
 Safety precautions to be maintained throughout the outpatient stay will include: orient to surroundings, keep bed in low position, maintain call bell within reach at all times, provide assistance with transfer out of bed and ambulation.

## 2024-05-18 NOTE — Telephone Encounter (Signed)
 LVM to call back to office to check up on pt she has an appt with Kacy on 05/20/24

## 2024-05-19 ENCOUNTER — Ambulatory Visit: Payer: Self-pay | Admitting: Nurse Practitioner

## 2024-05-19 ENCOUNTER — Ambulatory Visit: Payer: Self-pay

## 2024-05-19 LAB — COMPLIANCE DRUG ANALYSIS, UR

## 2024-05-19 NOTE — Telephone Encounter (Signed)
 FYI Only or Action Required?: FYI only for provider.  Patient was last seen in primary care on 02/24/2024 by Dineen Rollene MATSU, FNP.  Called Nurse Triage reporting Leg Swelling.  Symptoms began several days ago.  Symptoms are: gradually worsening.  Triage Disposition: See HCP Within 4 Hours (Or PCP Triage)  Patient/caregiver understands and will follow disposition?: Yes  Patient advised that there are no appointments until 8/20 in the office and that she should go to the ED for evaluation and treatment of her worsening swelling. Patient is agreeable with this plan.        Copied from CRM #[id number]. Topic: Clinical - Red Word Triage >> May 19, 2024  3:45 PM Chiquita SQUIBB wrote: Red Word that prompted transfer to Nurse Triage: Patient is calling in due to not being able to make her appointment today at 4, nothing available until next Wednesday to reschedule and the patient states her swelling is getting worse.  Pt. Cancelled today's appointment. Attempted to call pt. Back to reschedule, unable to leave a message to call back about symptoms.         Reason for Disposition  SEVERE leg swelling (e.g., swelling extends above knee, entire leg is swollen, weeping fluid)  Answer Assessment - Initial Assessment Questions 1. ONSET: When did the swelling start? (e.g., minutes, hours, days)     Worse over the last 2-3 days  2. LOCATION: What part of the leg is swollen?  Are both legs swollen or just one leg?     Both legs, right worse than left 3. SEVERITY: How bad is the swelling? (e.g., localized; mild, moderate, severe)     Severe  4. REDNESS: Is there redness or signs of infection?     Yes, patient reports she has sores on her leg 5. PAIN: Is the swelling painful to touch? If Yes, ask: How painful is it?   (Scale 1-10; mild, moderate or severe)     Moderate to severe when standing  6. FEVER: Do you have a fever? If Yes, ask: What is it, how was it measured, and when  did it start?      No 7. CAUSE: What do you think is causing the leg swelling?     History of angioedema  9. RECURRENT SYMPTOM: Have you had leg swelling before? If Yes, ask: When was the last time? What happened that time?     Yes 10. OTHER SYMPTOMS: Do you have any other symptoms? (e.g., chest pain, difficulty breathing)       No  Protocols used: Leg Swelling and Edema-A-AH

## 2024-05-19 NOTE — Telephone Encounter (Signed)
 Copied from CRM #[id number]. Topic: Clinical - Red Word Triage >> May 19, 2024  3:45 PM Chiquita SQUIBB wrote: Red Word that prompted transfer to Nurse Triage: Patient is calling in due to not being able to make her appointment today at 4, nothing available until next Wednesday to reschedule and the patient states her swelling is getting worse.  Pt. Cancelled today's appointment. Attempted to call pt. Back to reschedule, unable to leave a message to call back about symptoms.

## 2024-05-20 ENCOUNTER — Ambulatory Visit: Admitting: Nurse Practitioner

## 2024-05-20 ENCOUNTER — Emergency Department
Admission: EM | Admit: 2024-05-20 | Discharge: 2024-05-20 | Disposition: A | Discharge status: Against Medical Advice | Source: Ambulatory Visit | Attending: Emergency Medicine | Admitting: Emergency Medicine

## 2024-05-20 ENCOUNTER — Ambulatory Visit: Payer: Self-pay

## 2024-05-20 ENCOUNTER — Other Ambulatory Visit: Payer: Self-pay

## 2024-05-20 DIAGNOSIS — R2241 Localized swelling, mass and lump, right lower limb: Secondary | ICD-10-CM | POA: Insufficient documentation

## 2024-05-20 DIAGNOSIS — Z5321 Procedure and treatment not carried out due to patient leaving prior to being seen by health care provider: Secondary | ICD-10-CM | POA: Insufficient documentation

## 2024-05-20 LAB — COMPREHENSIVE METABOLIC PANEL WITH GFR
ALT: 18 U/L (ref 0–44)
AST: 20 U/L (ref 15–41)
Albumin: 3.6 g/dL (ref 3.5–5.0)
Alkaline Phosphatase: 89 U/L (ref 38–126)
Anion gap: 10 (ref 5–15)
BUN: 11 mg/dL (ref 6–20)
CO2: 25 mmol/L (ref 22–32)
Calcium: 9.2 mg/dL (ref 8.9–10.3)
Chloride: 107 mmol/L (ref 98–111)
Creatinine, Ser: 1.14 mg/dL — ABNORMAL HIGH (ref 0.44–1.00)
GFR, Estimated: 57 mL/min — ABNORMAL LOW (ref >60)
Glucose, Bld: 101 mg/dL — ABNORMAL HIGH (ref 70–99)
Potassium: 4.5 mmol/L (ref 3.5–5.1)
Sodium: 142 mmol/L (ref 135–145)
Total Bilirubin: 0.2 mg/dL (ref 0.0–1.2)
Total Protein: 6.7 g/dL (ref 6.5–8.1)

## 2024-05-20 LAB — CBC
HCT: 33.7 % — ABNORMAL LOW (ref 36.0–46.0)
Hemoglobin: 12 g/dL (ref 12.0–15.0)
MCH: 28.5 pg (ref 26.0–34.0)
MCHC: 35.6 g/dL (ref 30.0–36.0)
MCV: 80 fL (ref 80.0–100.0)
Platelets: 193 K/uL (ref 150–400)
RBC: 4.21 MIL/uL (ref 3.87–5.11)
RDW: 14.6 % (ref 11.5–15.5)
WBC: 6.4 K/uL (ref 4.0–10.5)
nRBC: 0 % (ref 0.0–0.2)

## 2024-05-20 NOTE — Telephone Encounter (Signed)
 Spoke to pt she is at [facility] walk in to be seen

## 2024-05-20 NOTE — Telephone Encounter (Signed)
 FYI Only or Action Required?: FYI only for provider.  Patient was last seen in primary care on 02/24/2024 by Dineen Rollene MATSU, FNP.  Called Nurse Triage reporting Leg Swelling thighs down to feet/severe pain.  Symptoms began 2 days ago.  Interventions attempted: Nothing.  Symptoms are: unchanged.  Triage Disposition: Go to ED Now (or PCP Triage)  Patient/caregiver understands and will follow disposition?: yes            Copied from CRM #[id number]. Topic: Clinical - Red Word Triage >> May 20, 2024  8:49 AM Thersia BROCKS wrote: [name] that prompted transfer to Nurse Triage:Patient called in stated Leg is swollen going down to her feet would like to be seen by someone today Reason for Disposition  [1] Difficulty breathing with exertion (e.g., walking) and [2] NEW or getting WORSE  Answer Assessment - Initial Assessment Questions 1. ONSET: When did the swelling start? (e.g., minutes, hours, days)     2 days ago  2. LOCATION: What part of the leg is swollen?  Are both legs swollen or just one leg?     Both /  3. SEVERITY: How bad is the swelling? (e.g., localized; mild, moderate, severe)     severe 4. REDNESS: Is there redness or signs of infection?     *No Answer* 5. PAIN: Is the swelling painful to touch? If Yes, ask: How painful is it?   (Scale 1-10; mild, moderate or severe)     severe 6. FEVER: Do you have a fever? If Yes, ask: What is it, how was it measured, and when did it start?      *No Answer* 7. CAUSE: What do you think is causing the leg swelling?     *No Answer* 8. MEDICAL HISTORY: Do you have a history of blood clots (e.g., DVT), cancer, heart failure, kidney disease, or liver failure?     *No Answer* 9. RECURRENT SYMPTOM: Have you had leg swelling before? If Yes, ask: When was the last time? What happened that time?     *No Answer* 10. OTHER SYMPTOMS: Do you have any other symptoms? (e.g., chest pain, difficulty breathing)        Back, numbness thigh to feet  with standing, sharp pain to back- sitting causes the pain ,, right lower back pain  Protocols used: Leg Swelling and Edema-A-AH

## 2024-05-20 NOTE — ED Triage Notes (Signed)
 Pt reports swelling to her right lower leg that started 2 days ago, denies any known injury, states that the swelling is extending up to her right thigh and pain up into her back

## 2024-05-20 NOTE — Telephone Encounter (Signed)
 LVM for pt to call back to see if she has been seen yet

## 2024-05-21 ENCOUNTER — Emergency Department

## 2024-05-21 ENCOUNTER — Emergency Department
Admission: EM | Admit: 2024-05-21 | Discharge: 2024-05-21 | Disposition: A | Discharge status: Home / Self Care | Attending: Emergency Medicine | Admitting: Emergency Medicine

## 2024-05-21 ENCOUNTER — Other Ambulatory Visit: Payer: Self-pay

## 2024-05-21 DIAGNOSIS — E119 Type 2 diabetes mellitus without complications: Secondary | ICD-10-CM | POA: Insufficient documentation

## 2024-05-21 DIAGNOSIS — I10 Essential (primary) hypertension: Secondary | ICD-10-CM | POA: Diagnosis not present

## 2024-05-21 DIAGNOSIS — J45909 Unspecified asthma, uncomplicated: Secondary | ICD-10-CM | POA: Diagnosis not present

## 2024-05-21 DIAGNOSIS — Z794 Long term (current) use of insulin: Secondary | ICD-10-CM | POA: Diagnosis not present

## 2024-05-21 DIAGNOSIS — R0600 Dyspnea, unspecified: Secondary | ICD-10-CM | POA: Diagnosis not present

## 2024-05-21 DIAGNOSIS — Z7951 Long term (current) use of inhaled steroids: Secondary | ICD-10-CM | POA: Insufficient documentation

## 2024-05-21 DIAGNOSIS — R0602 Shortness of breath: Secondary | ICD-10-CM | POA: Diagnosis present

## 2024-05-21 DIAGNOSIS — Z79899 Other long term (current) drug therapy: Secondary | ICD-10-CM | POA: Diagnosis not present

## 2024-05-21 DIAGNOSIS — R600 Localized edema: Secondary | ICD-10-CM | POA: Insufficient documentation

## 2024-05-21 LAB — CBC
HCT: 34.2 % — ABNORMAL LOW (ref 36.0–46.0)
Hemoglobin: 12.2 g/dL (ref 12.0–15.0)
MCH: 28.4 pg (ref 26.0–34.0)
MCHC: 35.7 g/dL (ref 30.0–36.0)
MCV: 79.5 fL — ABNORMAL LOW (ref 80.0–100.0)
Platelets: 207 K/uL (ref 150–400)
RBC: 4.3 MIL/uL (ref 3.87–5.11)
RDW: 14.7 % (ref 11.5–15.5)
WBC: 5.7 K/uL (ref 4.0–10.5)
nRBC: 0 % (ref 0.0–0.2)

## 2024-05-21 LAB — BASIC METABOLIC PANEL WITH GFR
Anion gap: 9 (ref 5–15)
BUN: 11 mg/dL (ref 6–20)
CO2: 25 mmol/L (ref 22–32)
Calcium: 9 mg/dL (ref 8.9–10.3)
Chloride: 103 mmol/L (ref 98–111)
Creatinine, Ser: 1.13 mg/dL — ABNORMAL HIGH (ref 0.44–1.00)
GFR, Estimated: 57 mL/min — ABNORMAL LOW (ref >60)
Glucose, Bld: 96 mg/dL (ref 70–99)
Potassium: 4.2 mmol/L (ref 3.5–5.1)
Sodium: 137 mmol/L (ref 135–145)

## 2024-05-21 LAB — D-DIMER, QUANTITATIVE: D-Dimer, Quant: 0.68 ug{FEU}/mL — ABNORMAL HIGH (ref 0.00–0.50)

## 2024-05-21 LAB — BRAIN NATRIURETIC PEPTIDE: B Natriuretic Peptide: 43.2 pg/mL (ref 0.0–100.0)

## 2024-05-21 MED ORDER — IOHEXOL 350 MG/ML SOLN
75.0000 mL | Freq: Once | INTRAVENOUS | Status: AC | PRN
  Administered 2024-05-21: 75 mL via INTRAVENOUS

## 2024-05-21 NOTE — ED Triage Notes (Addendum)
 Pt came yesterday but LWBS. C/o swelling to RLE, near knee that started 2 weeks ago. Denies trauma, falls, long drives. Pt reports intermittent SOB and back pain that started 2 days ago. PMH: schizophrenia, asthma, HTN, DM GCS 15, ambulates with cane

## 2024-05-21 NOTE — ED Provider Notes (Signed)
 [place] EMERGENCY DEPARTMENT AT [facility] REGIONAL Provider Note   CSN: [id number] Arrival date & time: 05/21/24  9048     Patient presents with: Shortness of Breath and Leg Swelling   Heather Sweeney is a 58 y.o. female.   Patient here for shortness of breath.  Noting 2 weeks of extremity edema without any injury.  She has also started to feel short of breath for the past 2 days.  No cough congestion fevers or chills.  No chest pain.  Currently not short of breath.  No history of VTE or ACS.   Shortness of Breath Associated symptoms: no abdominal pain, no chest pain, no cough, no fever and no vomiting        Prior to Admission medications   Medication Sig Start Date End Date Taking? Authorizing Provider  ACCU-CHEK GUIDE test strip Use as instructed 06/07/22   Webb, Padonda B, FNP  albuterol  (VENTOLIN  HFA) 108 (90 Base) MCG/ACT inhaler Inhale 1-2 puffs into the lungs every 6 (six) hours as needed for wheezing or shortness of breath. 04/04/22   McLean-Scocuzza, Randine LOISE, MD  amLODipine  (NORVASC ) 5 MG tablet TAKE ONE TABLET BY MOUTH ONCE EVERY DAY 01/25/24   Dineen Rollene MATSU, FNP  ARIPiprazole ER (ABILIFY MAINTENA) 300 MG SRER injection as directed Intramuscular every two weeks    [provider]  BELBUCA 150 MCG FILM Take 150 mcg by mouth 2 (two) times daily. 04/27/24   [provider]  benztropine  (COGENTIN ) 1 MG tablet Take 1 mg by mouth 2 (two) times daily. 08/10/20   [provider]  Blood Glucose Monitoring Suppl (ACCU-CHEK GUIDE ME) w/Device KIT Use as directed,once daily to test blood sugar 05/22/22   Webb, Padonda B, FNP  buprenorphine (BUTRANS) 15 MCG/HR 1 patch once a week. Patient not taking: Reported on 05/17/2024 01/09/24   [provider]  buPROPion (WELLBUTRIN XL) 300 MG 24 hr tablet Take 300 mg by mouth daily. 12/24/23   [provider]  busPIRone (BUSPAR) 15 MG tablet Take 15 mg by mouth 2 (two) times daily. 11/26/22    [provider]  DULoxetine  (CYMBALTA ) 20 MG capsule Take 1 capsule (20 mg total) by mouth daily. 12/14/23   Raulkar, Sven SQUIBB, MD  EPINEPHrine  0.3 mg/0.3 mL IJ SOAJ injection Inject 0.3 mg into the muscle as needed for anaphylaxis. 03/07/22   McLean-Scocuzza, Randine LOISE, MD  famotidine  (PEPCID ) 40 MG tablet TAKE ONE TABLET BY MOUTH ONCE EVERY DAY 02/18/24   Dineen Rollene MATSU, FNP  FLUoxetine  (PROZAC ) 40 MG capsule Take 40 mg by mouth daily. 07/15/20   [provider]  fluPHENAZine  (PROLIXIN ) 5 MG tablet Take 5 mg by mouth 2 (two) times daily. 11/14/22   [provider]  fluticasone  (FLONASE ) 50 MCG/ACT nasal spray INSTILL 2 SPRAYS IN EACH NOSTRIL ONCE DAILY 12/25/23   Hope Merle, MD  hydrOXYzine  (ATARAX ) 50 MG tablet Oral    [provider]  Insulin  Pen Needle (NOVOFINE) 30G X 8 MM MISC Use with victoza  daily 1.8 mg Patient not taking: Reported on 05/17/2024 12/22/22   Hope Merle, MD  lidocaine  (LIDODERM ) 5 % Place 1 patch onto the skin every 12 (twelve) hours. Remove & Discard patch within 12 hours or as directed by MD.  Ginnie the patch off for 12 hours before applying a new one. Patient not taking: Reported on 05/17/2024 02/15/24 02/14/25  Gordan Huxley, MD  metoprolol  succinate (TOPROL -XL) 25 MG 24 hr tablet Take 25 mg by mouth daily.  12/10/22   [provider]  Multiple Vitamins-Minerals (ALIVE MULTI-VITAMIN PO) Take by mouth daily in the afternoon.    [provider]  omalizumab  (XOLAIR ) 150 MG/ML prefilled syringe  07/10/22   [provider]  omeprazole  (PRILOSEC) 40 MG capsule TAKE 1 CAPSULE BY MOUTH ONCE EVERY DAY 02/18/24   Therisa Bi, MD  ondansetron  (ZOFRAN -ODT) 4 MG disintegrating tablet Take 1 tablet (4 mg total) by mouth every 8 (eight) hours as needed for nausea or vomiting. 02/24/24   Dineen Rollene MATSU, FNP  topiramate  (TOPAMAX ) 25 MG tablet Take 1 tablet (25 mg total) by mouth at bedtime. 11/27/23   Lorilee Sven SQUIBB, MD     Allergies: Hydrochlorothiazide , Keflex [cephalexin], Lisinopril, Losartan , Penicillins, Bee venom, and Metformin     Review of Systems  Constitutional:  Negative for chills and fever.  HENT:  Negative for congestion.   Respiratory:  Positive for shortness of breath. Negative for cough.   Cardiovascular:  Positive for leg swelling. Negative for chest pain.  Gastrointestinal:  Negative for abdominal pain, diarrhea, nausea and vomiting.  Genitourinary:  Negative for dysuria.  Neurological:  Negative for weakness and numbness.    Updated Vital Signs BP (!) 141/89 (BP Location: Right Arm)   Pulse 80   Temp 98.1 F (36.7 C) (Oral)   Resp 16   Ht 5' 3 (1.6 m)   Wt 117.9 kg   SpO2 99%   BMI 46.06 kg/m   Physical Exam Vitals reviewed.  Constitutional:      Appearance: Normal appearance.  HENT:     Head: Normocephalic and atraumatic.     Nose: Nose normal.  Cardiovascular:     Pulses: Normal pulses.  Pulmonary:     Effort: Pulmonary effort is normal. No tachypnea or accessory muscle usage.     Breath sounds: Normal breath sounds. No decreased breath sounds, wheezing, rhonchi or rales.  Chest:     Chest wall: No mass.  Musculoskeletal:     Cervical back: Normal range of motion.     Right lower leg: No tenderness. Edema present.     Left lower leg: No tenderness. Edema present.  Neurological:     Mental Status: She is alert and oriented to person, place, and time. Mental status is at baseline.  Psychiatric:        Mood and Affect: Mood normal.        Behavior: Behavior normal.     (all labs ordered are listed, but only abnormal results are displayed) Labs Reviewed  BASIC METABOLIC PANEL WITH GFR - Abnormal; Notable for the following components:      Result Value   Creatinine, Ser 1.13 (*)    GFR, Estimated 57 (*)    All other components within normal limits  CBC - Abnormal; Notable for the following components:   HCT 34.2 (*)    MCV 79.5 (*)    All other  components within normal limits  D-DIMER, QUANTITATIVE - Abnormal; Notable for the following components:   D-Dimer, Quant 0.68 (*)    All other components within normal limits  BRAIN NATRIURETIC PEPTIDE    EKG: None  Radiology: CT Angio Chest Pulmonary Embolism (PE) W or WO Contrast Result Date: 05/21/2024 CLINICAL DATA:  RIGHT lower extremity swelling. Short of breath. Concern pulmonary embolism. EXAM: CT ANGIOGRAPHY CHEST WITH CONTRAST TECHNIQUE: Multidetector CT imaging of the chest was performed using the standard protocol during bolus administration of intravenous contrast. Multiplanar CT image reconstructions and MIPs were obtained to  evaluate the vascular anatomy. RADIATION DOSE REDUCTION: This exam was performed according to the departmental dose-optimization program which includes automated exposure control, adjustment of the mA and/or kV according to patient size and/or use of iterative reconstruction technique. CONTRAST:  75mL OMNIPAQUE  IOHEXOL  350 MG/ML SOLN COMPARISON:  None Available. FINDINGS: Cardiovascular: No filling defects within the pulmonary arteries to suggest acute pulmonary embolism. Mediastinum/Nodes: No axillary or supraclavicular adenopathy. No mediastinal or hilar adenopathy. No pericardial fluid. Esophagus normal. Lungs/Pleura: No pulmonary infarction. No pneumonia. No pleural fluid. No pneumothorax Upper Abdomen: Limited view of the liver, kidneys, pancreas are unremarkable. Normal adrenal glands. Musculoskeletal: No aggressive osseous lesion. Review of the MIP images confirms the above findings. IMPRESSION: 1. No evidence acute pulmonary embolism. 2. No acute pulmonary parenchymal findings. Electronically Signed   By: Jackquline Boxer M.D.   On: 05/21/2024 14:34   US  Venous Img Lower Bilateral Result Date: 05/21/2024 CLINICAL DATA:  Bilateral lower extremity swelling and pain EXAM: BILATERAL LOWER EXTREMITY VENOUS DOPPLER ULTRASOUND TECHNIQUE: Gray-scale sonography with  compression, as well as color and duplex ultrasound, were performed to evaluate the deep venous system(s) from the level of the common femoral vein through the popliteal and proximal calf veins. COMPARISON:  04/28/2023 FINDINGS: VENOUS Normal compressibility of the common femoral, superficial femoral, and popliteal veins, as well as the visualized calf veins. Visualized portions of profunda femoral vein and great saphenous vein unremarkable. No filling defects to suggest DVT on grayscale or color Doppler imaging. Doppler waveforms show normal direction of venous flow, normal respiratory plasticity and response to augmentation. OTHER None. Limitations: none IMPRESSION: 1. No evidence of deep venous thrombosis within either lower extremity. Electronically Signed   By: Ozell Daring M.D.   On: 05/21/2024 13:41   DG Chest 2 View Result Date: 05/21/2024 EXAM: 2 VIEW(S) XRAY OF THE CHEST 05/21/2024 10:19:00 AM COMPARISON: 11/06/2023 CLINICAL HISTORY: Shortness of breath. FINDINGS: LUNGS AND PLEURA: No focal pulmonary opacity. No pulmonary edema. No pleural effusion. No pneumothorax. HEART AND MEDIASTINUM: No acute abnormality of the cardiac and mediastinal silhouettes. BONES AND SOFT TISSUES: No acute osseous abnormality. Surgical clips noted in the right upper quadrant of the abdomen. IMPRESSION: 1. No acute process. Electronically signed by: Waddell Calk MD 05/21/2024 10:31 AM EDT RP Workstation: [id number]     Procedures   Medications Ordered in the ED  iohexol  (OMNIPAQUE ) 350 MG/ML injection 75 mL (75 mLs Intravenous Contrast Given 05/21/24 1415)                                    Medical Decision Making Patient here for shortness of breath for the past few days with several weeks of bilateral equal extremity edema without any tenderness.  Will check labs including D-dimer although I have low suspicion of VTE primarily due to the bilateral nature.  CBC and CMP overall unremarkable.  BNP without  significant elevation.  D-dimer was positive however CT angiogram as well as DVT study were obtained both of which unremarkable.  Patient well-appearing nonhypoxic will recommend close follow-up with primary care for further evaluation.  Given return precautions here.  Amount and/or Complexity of Data Reviewed Labs: ordered. Radiology: ordered.  Risk Prescription drug management.        Final diagnoses:  Dyspnea, unspecified type  Peripheral edema    ED Discharge Orders     None          Kingston Mallick, PA-C  05/21/24 1449    Jacolyn Pae, MD 05/21/24 1934

## 2024-05-21 NOTE — Discharge Instructions (Signed)
 Please follow-up with primary care for further evaluation.  Return here for new or worse symptoms.

## 2024-05-23 ENCOUNTER — Ambulatory Visit

## 2024-05-23 ENCOUNTER — Ambulatory Visit: Payer: Self-pay

## 2024-05-23 DIAGNOSIS — Z113 Encounter for screening for infections with a predominantly sexual mode of transmission: Secondary | ICD-10-CM

## 2024-05-23 LAB — WET PREP FOR TRICH, YEAST, CLUE
Clue Cell Exam: NEGATIVE
Trichomonas Exam: NEGATIVE
Yeast Exam: NEGATIVE

## 2024-05-23 LAB — HM HIV SCREENING LAB: HM HIV Screening: NEGATIVE

## 2024-05-23 NOTE — Progress Notes (Signed)
 [name] Department STI clinic [address] [place] [address] Main phone: [phone]  STI screening visit  Subjective:  Heather Sweeney is a 58 y.o. female being seen today for an STI screening visit. The patient reports they do not have symptoms.  Patient reports that they do not desire a pregnancy in the next year (hx of hysterectomy).   No LMP recorded. Patient has had a hysterectomy.  Patient has the following medical conditions:  Patient Active Problem List   Diagnosis Date Noted   Lumbar facet arthropathy 05/17/2024   Chronic radicular lumbar pain 05/17/2024   Vomiting 02/24/2024   Encounter for screening mammogram for malignant neoplasm of breast 01/29/2024   Other dysphagia 11/05/2023   Vaginal bleeding 10/19/2023   H/O abdominoplasty 04/2023 10/15/2023   H/O: hysterectomy 2012 10/15/2023   Morbid obesity (HCC) BMI=42.9 10/15/2023   Enlarged clavicle 09/22/2023   Anxiety and depression 09/22/2023   Smoking history 11/05/2022   High serum vitamin B12 10/25/2022   Lymphedema 08/24/2022   Panniculitis 04/22/2022   Stage 3a chronic kidney disease (HCC) 03/16/2022   Chronic idiopathic urticaria 12/12/2021   Intertrigo 02/12/2021   Insomnia 10/11/2020   Bilateral primary osteoarthritis of knee 06/08/2020   Gastroesophageal reflux disease 04/20/2020   Bilateral chronic knee pain 04/20/2020   Mitral valve insufficiency 01/10/2020   Vitamin D  deficiency 02/01/2019   Mood disorder (HCC)--Bipolar 12/24/2018   HLD (hyperlipidemia) 09/01/2018   History of prediabetes 09/01/2018   Chronic pain 08/31/2018   Allergic rhinitis 08/31/2018   Lumbosacral radiculitis 02/19/2018   Angioedema 07/07/2017   Bilateral carpal tunnel syndrome 11/03/2016   Morbid obesity with BMI of 40.0-44.9, adult (HCC) 08/07/2016   Essential hypertension 07/10/2016   OSA on CPAP 07/10/2016   No chief complaint on file.  HPI Patient reports desire for  asymptomatic STI testing. No contacts or concerns.  See flowsheet for further details and programmatic requirements Hyperlink available at the top of the signed note in blue.  Flow sheet content below:  Pregnancy Intention Screening Does the patient want to become pregnant in the next year?: No Does the patient's partner want to become pregnant in the next year?: No Would the patient like to discuss contraceptive options today?: No Reason For STD Screen STD Screening: Is asymptomatic Have you ever had an STD?: No History of Antibiotic use in the past 2 weeks?: No STD Symptoms Denies all: Yes Risk Factors for Hep B Household, sexual, or needle sharing contact of a person infected with Hep B: No Sexual contact with a person who uses drugs not as prescribed?: No Currently or Ever used drugs not as prescribed: No HIV Positive: No PRep Patient: No Men who have sex with men: No Have Hepatitis C: No History of Incarceration: No History of Homeslessness?: No Anal sex following anal drug use?: No Risk Factors for Hep C Currently using drugs not as prescribed: No Sexual partner(s) currently using drugs as not prescribed: No History of drug use: No HIV Positive: No People with a history of incarceration: No People born between the years of 49 and 77: No Abuse History Has patient ever been abused physically?: No Has patient ever been abused sexually?: No Does patient feel they have a problem with Anxiety?: No Does patient feel they have a problem with Depression?: No Counseling Patient counseled to use condoms with all sex: Condoms declined RTC in 2-3 weeks for test results: Yes Clinic will call if test results abnormal before test  result appt.: Yes Test results given to patient Patient counseled to use condoms with all sex: Condoms declined   Screening for MPX risk: Does the patient have an unexplained rash? No Is the patient MSM? No Does the patient endorse multiple sex  partners or anonymous sex partners? No Did the patient have close or sexual contact with a person diagnosed with MPX? No Has the patient traveled outside the [place]  where MPX is endemic? No Is there a high clinical suspicion for MPX-- evidenced by one of the following No  -Unlikely to be chickenpox  -Lymphadenopathy  -Rash that present in same phase of evolution on any given body part  Screenings: Last HIV test per patient/review of record was  Lab Results  Component Value Date   HMHIVSCREEN Negative - Validated 10/15/2023    Lab Results  Component Value Date   HIV Non Reactive 07/07/2017     Last HEPC test per patient/review of record was  Lab Results  Component Value Date   HMHEPCSCREEN Negative-Validated 10/15/2023   No components found for: HEPC   Last HEPB test per patient/review of record was No components found for: HMHEPBSCREEN   Patient reports last pap was:   No results found for: SPECADGYN Result Date Procedure Results Follow-ups  12/02/2022 Cytology - PAP High risk HPV: Negative Adequacy: Satisfactory for evaluation. Diagnosis: - Negative for intraepithelial lesion or malignancy (NILM) Comment: Normal Reference Range HPV - Negative   11/29/2019 Cytology - PAP High risk HPV: Negative Adequacy: Satisfactory for evaluation. Diagnosis: - Negative for intraepithelial lesion or malignancy (NILM) Comment: Normal Reference Range HPV - Negative   06/23/2017 HM PAP SMEAR HM Pap smear: normal   02/29/2016 HM PAP SMEAR      Immunization history:  Immunization History  Administered Date(s) Administered   Influenza, Mdck, Trivalent,PF 6+ MOS(egg free) 02/05/2023, 06/22/2023   Influenza,inj,Quad PF,6+ Mos 07/12/2016, 06/09/2019, 08/01/2021, 06/25/2022   Influenza-Unspecified 05/06/2020, 08/26/2023   PFIZER(Purple Top)SARS-COV-2 Vaccination 01/17/2020, 02/07/2020   Pfizer Covid-19 Vaccine Bivalent Booster 51yrs & up 11/24/2021   Pneumococcal Conjugate-13 09/10/2021    Pneumococcal Polysaccharide-23 07/12/2016   Td 01/03/1988   Tdap 04/17/2009, 11/24/2021   Zoster Recombinant(Shingrix ) 08/01/2021, 11/24/2021    The following portions of the patient's history were reviewed and updated as appropriate: allergies, current medications, past medical history, past social history, past surgical history and problem list.  Objective:  There were no vitals filed for this visit.  Physical Exam Vitals and nursing note reviewed. Exam conducted with a chaperone present Ilah B).  Constitutional:      Appearance: Normal appearance.  HENT:     Head: Normocephalic and atraumatic.     Mouth/Throat:     Mouth: Mucous membranes are moist.     Pharynx: Oropharynx is clear. No oropharyngeal exudate or posterior oropharyngeal erythema.  Pulmonary:     Effort: Pulmonary effort is normal.  Abdominal:     General: Abdomen is flat.     Palpations: There is no mass.     Tenderness: There is no abdominal tenderness. There is no rebound.  Genitourinary:    General: Normal vulva.     Exam position: Lithotomy position.     Pubic Area: No rash or pubic lice.      Labia:        Right: No rash or lesion.        Left: No rash or lesion.      Vagina: Normal. No vaginal discharge, erythema, bleeding or lesions.  Uterus: Absent.   Lymphadenopathy:     Head:     Right side of head: No preauricular or posterior auricular adenopathy.     Left side of head: No preauricular or posterior auricular adenopathy.     Cervical: No cervical adenopathy.     Upper Body:     Right upper body: No supraclavicular, axillary or epitrochlear adenopathy.     Left upper body: No supraclavicular, axillary or epitrochlear adenopathy.     Lower Body: No right inguinal adenopathy. No left inguinal adenopathy.  Skin:    General: Skin is warm and dry.     Findings: No rash.  Neurological:     Mental Status: She is alert and oriented to person, place, and time.      Assessment and Plan:   Heather Sweeney is a 58 y.o. female presenting to the [facility] Department for STI screening  1. Screening for venereal disease (Primary)  - HIV [place] [facility] - Syphilis Serology, [place] [facility] - WET PREP FOR TRICH, YEAST, CLUE - Chlamydia/Gonorrhea [place] [facility]   Patient accepted the following screenings: vaginal CT/GC swab, vaginal wet prep, HIV, and RPR Patient meets criteria for HepB screening? No. Ordered? no Patient meets criteria for HepC screening? No. Ordered? no  Treat wet prep per standing order Discussed time line for [facility] results and that patient will be called with positive results and encouraged patient to call if she had not heard in 2 weeks.  Counseled to return or seek care for continued or worsening symptoms Recommended repeat testing in 3 months with positive results. Recommended condom use with all sex for STI prevention.   Return as needed for STI testing.  Future Appointments  Date Time Provider Department Center  05/30/2024 10:30 AM Dineen Rollene MATSU, FNP [facility]  06/01/2024  2:30 PM Marcelino Nurse, MD [facility] None  09/27/2024 11:30 AM [facility] ANNUAL WELLNESS VISIT [facility]  10/07/2024 12:15 PM Dillingham, Estefana RAMAN, DO [facility] None  10/10/2024 11:30 AM Schnier, Cordella MATSU, MD [facility] None    Damien FORBES Satchel, NP

## 2024-05-23 NOTE — Telephone Encounter (Signed)
 FYI Only or Action Required?: Action required by provider: ED f/u visit.  Patient was last seen in primary care on 02/24/2024 by Dineen Rollene MATSU, FNP.  Called Nurse Triage reporting Back Pain.  Symptoms began several days ago.  Interventions attempted: Nothing.  Symptoms are: unchanged.  Triage Disposition: No disposition on file.  Patient/caregiver understands and will follow disposition?: Yes      Copied from CRM [id number]. Topic: Clinical - Red Word Triage >> May 23, 2024  4:46 PM [place] J wrote: [name] that prompted transfer to Nurse Triage: Patient is having lower back pain that is going down to her right leg. Patient was seen at the ER yesterday and the Urgent care Friday. Answer Assessment - Initial Assessment Questions Patient calling to schedule ED f/u for back pain and intermittent shortness of breath. Patient states symptoms have not changed since ED visit. Patient unable to give full scope of symptoms to nurse triage. Patient confirmed purpose of call was to schedule hospital f/u visit. Patient booked for earliest appointment with PCP and added to the wait list.  Protocols used: No Guideline Available-A-AH

## 2024-05-23 NOTE — Progress Notes (Signed)
 Pt here for STI screening.  Wet mount results reviewed with patient.  No treatment needed as per standing orders.-Lakeva Hollon, RN

## 2024-05-27 ENCOUNTER — Telehealth: Payer: Self-pay | Admitting: Student in an Organized Health Care Education/Training Program

## 2024-05-27 NOTE — Telephone Encounter (Signed)
 PT called stated at her last visit Lateef and her discuss about some patches. PT stated that she called her pharmacy was informed that she didn't have a prescription. Please give patient a call. TY

## 2024-05-27 NOTE — Telephone Encounter (Signed)
 Attempted to call patient to inform her that we do not write pain medications on the first visit.  Unable to leave message due to mailbox being full.

## 2024-05-30 ENCOUNTER — Ambulatory Visit (INDEPENDENT_AMBULATORY_CARE_PROVIDER_SITE_OTHER): Payer: 59 | Admitting: Family

## 2024-05-30 ENCOUNTER — Telehealth: Payer: Self-pay

## 2024-05-30 VITALS — BP 150/90 | HR 79 | Temp 98.4°F | Ht 63.0 in | Wt 260.2 lb

## 2024-05-30 DIAGNOSIS — G4733 Obstructive sleep apnea (adult) (pediatric): Secondary | ICD-10-CM | POA: Diagnosis not present

## 2024-05-30 DIAGNOSIS — R600 Localized edema: Secondary | ICD-10-CM | POA: Insufficient documentation

## 2024-05-30 DIAGNOSIS — I10 Essential (primary) hypertension: Secondary | ICD-10-CM | POA: Diagnosis not present

## 2024-05-30 DIAGNOSIS — I340 Nonrheumatic mitral (valve) insufficiency: Secondary | ICD-10-CM

## 2024-05-30 MED ORDER — AMLODIPINE BESYLATE 5 MG PO TABS: 5.0000 mg | ORAL_TABLET | Freq: Every day | ORAL | 3 refills | Status: DC

## 2024-05-30 MED ORDER — ZEPBOUND 2.5 MG/0.5ML ~~LOC~~ SOAJ
2.5000 mg | SUBCUTANEOUS | 1 refills | Status: AC
Start: 2024-05-30 — End: ?

## 2024-05-30 NOTE — Telephone Encounter (Signed)
 Copied from CRM [id number]. Topic: General - Other >> May 30, 2024 12:21 PM Heather Sweeney wrote: Reason for CRM: Patient called in stating she would like to speak with nurse regarding compression socks

## 2024-05-30 NOTE — Progress Notes (Unsigned)
 Assessment & Plan:  There are no diagnoses linked to this encounter.   Return precautions given.   Risks, benefits, and alternatives of the medications and treatment plan prescribed today were discussed, and patient expressed understanding.   Education regarding symptom management and diagnosis given to patient on AVS either electronically or printed.  No follow-ups on file.  Rollene Northern, FNP  Subjective:    Patient ID: Heather Sweeney, female    DOB: 08-24-1966, 58 y.o.   MRN: [id number]  CC: Heather Sweeney is a 58 y.o. female who presents today for follow up.   HPI: HPI Compliant with amlodipine  5mg  , metoprolol  25mg  every day Previously on lasix  10mg  prn by Dr Rolan, Hydrochlorothiazide  25mg  Dr Lionel She had been on semaglutide  in 2023  Seen ED 8/156/25 for dyspnea and bilateral leg swelling D dimer 0.68 BNP 43 CXR wo pulmonary edema BLE US  without evidence of DVT CTA negative PE, PNA   Seen by health department 8/187/25 for screening for STI Allergies: Hydrochlorothiazide , Keflex [cephalexin], Lisinopril, Losartan , Penicillins, Bee venom, and Metformin  Current Outpatient Medications on File Prior to Visit  Medication Sig Dispense Refill   ACCU-CHEK GUIDE test strip Use as instructed 100 strip 3   albuterol  (VENTOLIN  HFA) 108 (90 Base) MCG/ACT inhaler Inhale 1-2 puffs into the lungs every 6 (six) hours as needed for wheezing or shortness of breath. 18 g 11   amLODipine  (NORVASC ) 5 MG tablet TAKE ONE TABLET BY MOUTH ONCE EVERY DAY 90 tablet 1   ARIPiprazole ER (ABILIFY MAINTENA) 300 MG SRER injection as directed Intramuscular every two weeks     BELBUCA  150 MCG FILM Take 150 mcg by mouth 2 (two) times daily.     benztropine  (COGENTIN ) 1 MG tablet Take 1 mg by mouth 2 (two) times daily.     Blood Glucose Monitoring Suppl (ACCU-CHEK GUIDE ME) w/Device KIT Use as directed,once daily to test blood sugar 1 kit 0   buPROPion (WELLBUTRIN XL) 300 MG 24 hr tablet Take 300  mg by mouth daily.     busPIRone (BUSPAR) 15 MG tablet Take 15 mg by mouth 2 (two) times daily.     DULoxetine  (CYMBALTA ) 20 MG capsule Take 1 capsule (20 mg total) by mouth daily. 90 capsule 3   EPINEPHrine  0.3 mg/0.3 mL IJ SOAJ injection Inject 0.3 mg into the muscle as needed for anaphylaxis. 2 each 3   famotidine  (PEPCID ) 40 MG tablet TAKE ONE TABLET BY MOUTH ONCE EVERY DAY 120 tablet 11   FLUoxetine  (PROZAC ) 40 MG capsule Take 40 mg by mouth daily.     fluPHENAZine  (PROLIXIN ) 5 MG tablet Take 5 mg by mouth 2 (two) times daily.     fluticasone  (FLONASE ) 50 MCG/ACT nasal spray INSTILL 2 SPRAYS IN EACH NOSTRIL ONCE DAILY 16 g 10   hydrOXYzine  (ATARAX ) 50 MG tablet Oral     metoprolol  succinate (TOPROL -XL) 25 MG 24 hr tablet Take 25 mg by mouth daily.     Multiple Vitamins-Minerals (ALIVE MULTI-VITAMIN PO) Take by mouth daily in the afternoon.     omeprazole  (PRILOSEC) 40 MG capsule TAKE 1 CAPSULE BY MOUTH ONCE EVERY DAY 120 capsule 11   ondansetron  (ZOFRAN -ODT) 4 MG disintegrating tablet Take 1 tablet (4 mg total) by mouth every 8 (eight) hours as needed for nausea or vomiting. 20 tablet 0   topiramate  (TOPAMAX ) 25 MG tablet Take 1 tablet (25 mg total) by mouth at bedtime. 90 tablet 3   buprenorphine  (BUTRANS) 15 MCG/HR 1  patch once a week. (Patient not taking: Reported on 05/30/2024)     Insulin  Pen Needle (NOVOFINE) 30G X 8 MM MISC Use with victoza  daily 1.8 mg (Patient not taking: Reported on 05/30/2024) 90 each 3   lidocaine  (LIDODERM ) 5 % Place 1 patch onto the skin every 12 (twelve) hours. Remove & Discard patch within 12 hours or as directed by MD.  Ginnie the patch off for 12 hours before applying a new one. (Patient not taking: Reported on 05/30/2024) 30 patch 0   omalizumab  (XOLAIR ) 150 MG/ML prefilled syringe  (Patient not taking: Reported on 05/30/2024)     No current facility-administered medications on file prior to visit.    Review of Systems    Objective:    BP (!) 150/90    Pulse 79   Temp 98.4 F (36.9 C) (Oral)   Ht 5' 3 (1.6 m)   Wt 260 lb 3.2 oz (118 kg)   SpO2 96%   BMI 46.09 kg/m  BP Readings from Last 3 Encounters:  05/30/24 (!) 150/90  05/21/24 (!) 141/89  05/17/24 132/63   Wt Readings from Last 3 Encounters:  05/30/24 260 lb 3.2 oz (118 kg)  05/21/24 260 lb (117.9 kg)  05/17/24 259 lb (117.5 kg)    Physical Exam

## 2024-05-30 NOTE — Patient Instructions (Addendum)
 Take amlodipine  5mg  when you get home.  It is imperative that you are seen AT least twice per year for labs and monitoring. Monitor blood pressure at home and me 5-6 reading on separate days. Goal is less than 120/80, based on newest guidelines, however we certainly want to be less than 130/80;  if persistently higher, please make sooner follow up appointment so we can recheck you blood pressure and manage/ adjust medications.   Trial Zepbound  2.5mg  once per week injected subcutaneously ( [facility] M[facility] Cintron)  in stomach. Please clean with alcohol  swab prior to injection and be sure to rotate site. You may schedule a nurse visit if you would like to first injection.   After 4 weeks, and if tolerated and weight loss has not reached 1-2 lbs per week, please increase to 5mg  once per week .    Please read information on medication below and remember black box warning that you may not take if you or a family member is diagnosed with thyroid  cancer (medullary thyroid  cancer), or multiple endocrine neoplasia.      Tirzepatide  Injection (Weight Management) What is this medication? TIRZEPATIDE  (tir ZEP a tide) promotes weight loss. It may also be used to maintain weight loss.  It works by decreasing appetite. Changes to diet and exercise are often combined with this medication. This medicine may be used for other purposes; ask your health care provider or pharmacist if you have questions. COMMON BRAND NAME(S): Zepbound  What should I tell my care team before I take this medication? They need to know if you have any of these conditions: Diabetes Eye disease caused by diabetes Gallbladder disease Have or have had depression Have or have had pancreatitis Having surgery Kidney disease Personal or family history of MEN 2, a condition that causes endocrine gland tumors Personal or family history of thyroid  cancer Stomach or intestine problems, such as problems digesting food Suicidal thoughts, plans, or attempt An  unusual or allergic reaction to tirzepatide , other medications, foods, dyes, or preservatives Pregnant or trying to get pregnant Breastfeeding How should I use this medication? This medication is injected under the skin. You will be taught how to prepare and give it. Take it as directed on the prescription label. Keep taking it unless your care team tells you to stop. It is important that you put your used needles and syringes in a special sharps container. Do not put them in a trash can. If you do not have a sharps container, call your pharmacist or care team to get one. A special MedGuide will be given to you by the pharmacist with each prescription and refill. Be sure to read this information carefully each time. This medication comes with INSTRUCTIONS FOR USE. Ask your pharmacist for directions on how to use this medication. Read the information carefully. Talk to your pharmacist or care team if you have questions. Talk to your care team about the use of this medication in children. Special care may be needed. Overdosage: If you think you have taken too much of this medicine contact a poison control center or emergency room at once. NOTE: This medicine is only for you. Do not share this medicine with others. What if I miss a dose? If you miss a dose, take it as soon as you can unless it is more than 4 days (96 hours) late. If it is more than 4 days late, skip the missed dose. Take the next dose at the normal time. Do not take 2 doses within  3 days (72 hours) of each other. What may interact with this medication? Certain medications for diabetes, such as insulin , glyburide, glipizide This medication may affect how other medications work. Talk with your care team about all of the medications you take. They may suggest changes to your treatment plan to lower the risk of side effects and to make sure your medications work as intended. This list may not describe all possible interactions. Give your  health care provider a list of all the medicines, herbs, non-prescription drugs, or dietary supplements you use. Also tell them if you smoke, drink alcohol , or use illegal drugs. Some items may interact with your medicine. What should I watch for while using this medication? Visit your care team for regular checks on your progress. Tell your care team if your condition does not start to get better or if it gets worse. Tell your care team if you are taking medication to treat diabetes, such as insulin  or glipizide. This may increase your risk of low blood sugar. Know the symptoms of low blood sugar and how to treat it. Talk to your care team about your risk of cancer. You may be more at risk for certain types of cancer if you take this medication. Talk to your care team right away if you have a lump or swelling in your neck, hoarseness that does not go away, trouble swallowing, shortness of breath, or trouble breathing. Make sure you stay hydrated while taking this medication. Drink water often. Eat fruits and veggies that have a high water content. Drink more water when it is hot or you are active. Talk to your care team right away if you have fever, infection, vomiting, diarrhea, or if you sweat a lot while taking this medication. The loss of too much body fluid may make it dangerous for you to take this medication. If you are going to need surgery or a procedure, tell your care team that you are taking this medication. Estrogen and progestin hormones that you take by mouth may not work as well while you are taking this medication. Switch to a non-oral contraceptive or add a barrier contraceptive for 4 weeks after starting this medication and after each dose increase. Talk to your care team about contraceptive options. They can help you find the option that works for you. What side effects may I notice from receiving this medication? Side effects that you should report to your care team as soon as  possible: Allergic reactions or angioedema--skin rash, itching or hives, swelling of the face, eyes, lips, tongue, arms, or legs, trouble swallowing or breathing Bowel blockage--stomach cramping, unable to have a bowel movement or pass gas, loss of appetite, vomiting Change in vision Dehydration--increased thirst, dry mouth, feeling faint or lightheaded, headache, dark yellow or brown urine Gallbladder problems--severe stomach pain, nausea, vomiting, fever Kidney injury--decrease in the amount of urine, swelling of the ankles, hands, or feet Pancreatitis--severe stomach pain that spreads to your back or gets worse after eating or when touched, fever, nausea, vomiting Thoughts of suicide or self-harm, worsening mood, feelings of depression Thyroid  cancer--new mass or lump in the neck, pain or trouble swallowing, trouble breathing, hoarseness Side effects that usually do not require medical attention (report these to your care team if they continue or are bothersome): Diarrhea Loss of appetite Nausea Upset stomach This list may not describe all possible side effects. Call your doctor for medical advice about side effects. You may report side effects to FDA at 1-800-FDA-1088.  Where should I keep my medication? Keep out of the reach of children and pets. Store in a refrigerator or at room temperature up to 30 degrees C (86 degrees F). Keep it in the original container. Protect from light. Refrigeration (preferred): Store in the refrigerator. Do not freeze. Get rid of any unused medication after the expiration date. Room temperature: This medication may be stored at room temperature for up to 21 days. If it is stored at room temperature, get rid of any unused medication after 21 days or after it expires, whichever is first. To get rid of medications that are no longer needed or have expired: Take the medication to a medication take-back program. Check with your pharmacy or law enforcement to find a  location. If you cannot return the medication, ask your pharmacist or care team how to get rid of this medication safely. NOTE: This sheet is a summary. It may not cover all possible information. If you have questions about this medicine, talk to your doctor, pharmacist, or health care provider.  2024 [employer] Standard (2023-09-04 00:00:00)

## 2024-05-30 NOTE — Telephone Encounter (Signed)
 Pt has an appointment with Arnett 05/30/2024 @ 10:30.

## 2024-05-30 NOTE — Telephone Encounter (Signed)
 Spoke to pt in regards to compression stockings

## 2024-06-01 ENCOUNTER — Ambulatory Visit (HOSPITAL_BASED_OUTPATIENT_CLINIC_OR_DEPARTMENT_OTHER): Admitting: Student in an Organized Health Care Education/Training Program

## 2024-06-01 ENCOUNTER — Encounter: Payer: Self-pay | Admitting: Student in an Organized Health Care Education/Training Program

## 2024-06-01 ENCOUNTER — Ambulatory Visit
Admission: RE | Admit: 2024-06-01 | Discharge: 2024-06-01 | Disposition: A | Discharge status: Home / Self Care | Source: Ambulatory Visit | Attending: Student in an Organized Health Care Education/Training Program | Admitting: Student in an Organized Health Care Education/Training Program

## 2024-06-01 VITALS — BP 149/78 | HR 71 | Temp 97.5°F | Resp 14 | Ht 63.0 in | Wt 269.0 lb

## 2024-06-01 DIAGNOSIS — G8929 Other chronic pain: Secondary | ICD-10-CM | POA: Insufficient documentation

## 2024-06-01 DIAGNOSIS — G894 Chronic pain syndrome: Secondary | ICD-10-CM | POA: Diagnosis present

## 2024-06-01 DIAGNOSIS — M25561 Pain in right knee: Secondary | ICD-10-CM | POA: Insufficient documentation

## 2024-06-01 DIAGNOSIS — M170 Bilateral primary osteoarthritis of knee: Secondary | ICD-10-CM | POA: Insufficient documentation

## 2024-06-01 DIAGNOSIS — M25562 Pain in left knee: Secondary | ICD-10-CM | POA: Insufficient documentation

## 2024-06-01 MED ORDER — LIDOCAINE HCL 2 % IJ SOLN
20.0000 mL | Freq: Once | INTRAMUSCULAR | Status: AC
  Administered 2024-06-01: 400 mg
  Filled 2024-06-01: qty 20

## 2024-06-01 MED ORDER — DEXAMETHASONE SODIUM PHOSPHATE 10 MG/ML IJ SOLN
20.0000 mg | Freq: Once | INTRAMUSCULAR | Status: AC
  Administered 2024-06-01: 20 mg
  Filled 2024-06-01: qty 2

## 2024-06-01 MED ORDER — BELBUCA 300 MCG BU FILM: 1.0000 | ORAL_FILM | Freq: Two times a day (BID) | BUCCAL | 0 refills | Status: AC

## 2024-06-01 MED ORDER — DEXAMETHASONE SODIUM PHOSPHATE 10 MG/ML IJ SOLN
INTRAMUSCULAR | Status: AC
  Filled 2024-06-01: qty 1

## 2024-06-01 MED ORDER — DIAZEPAM 5 MG PO TABS
ORAL_TABLET | ORAL | Status: AC
  Filled 2024-06-01: qty 1

## 2024-06-01 MED ORDER — ROPIVACAINE HCL 2 MG/ML IJ SOLN
18.0000 mL | Freq: Once | INTRAMUSCULAR | Status: AC
  Administered 2024-06-01: 18 mL via PERINEURAL
  Filled 2024-06-01: qty 20

## 2024-06-01 MED ORDER — DIAZEPAM 5 MG PO TABS
5.0000 mg | ORAL_TABLET | ORAL | Status: AC
  Administered 2024-06-01: 5 mg via ORAL

## 2024-06-01 NOTE — Progress Notes (Signed)
 Safety precautions to be maintained throughout the outpatient stay will include: orient to surroundings, keep bed in low position, maintain call bell within reach at all times, provide assistance with transfer out of bed and ambulation.

## 2024-06-01 NOTE — Progress Notes (Signed)
 PROVIDER NOTE: Interpretation of information contained herein should be left to medically-trained personnel. Specific patient instructions are provided elsewhere under Patient Instructions section of medical record. This document was created in part using STT-dictation technology, any transcriptional errors that may result from this process are unintentional.  Patient: Heather Sweeney Type: Established DOB: 11-27-1965 MRN: [id number] PCP: Dineen Rollene MATSU, FNP  Service: Procedure DOS: 06/01/2024 Setting: Ambulatory Location: Ambulatory outpatient facility Delivery: Face-to-face Provider: Wallie Sherry, MD Specialty: Interventional Pain Management Specialty designation: 09 Location: Outpatient facility Ref. Prov.: Dineen Rollene MATSU, FNP       Interventional Therapy   Procedure:          Type: Genicular Nerves Block (Superolateral, Superomedial, and Inferomedial Genicular Nerves)  #1  Laterality: Bilateral (-50)  Level: Superior and inferior to the knee joint.  Imaging: Fluoroscopic guidance Anesthesia: Local anesthesia (1-2% Lidocaine ) Sedation: Minimal Sedation                       DOS: 06/01/2024  Performed by: Wallie Sherry, MD  Purpose: Diagnostic/Therapeutic Indications: Chronic knee pain severe enough to impact quality of life or function. Rationale (medical necessity): procedure needed and proper for the diagnosis and/or treatment of Heather Sweeney's medical symptoms and needs. 1. Bilateral primary osteoarthritis of knee   2. Bilateral chronic knee pain   3. Chronic pain syndrome    NAS-11 Pain score:   Pre-procedure: 9 /10   Post-procedure: 9 /10     Target: For Genicular Nerve block(s), the targets are: the superolateral genicular nerve, located in the lateral distal portion of the femoral shaft as it curves to form the lateral epicondyle, in the region of the distal femoral metaphysis; the superomedial genicular nerve, located in the medial distal portion of the femoral  shaft as it curves to form the medial epicondyle; and the inferomedial genicular nerve, located in the medial, proximal portion of the tibial shaft, as it curves to form the medial epicondyle, in the region of the proximal tibial metaphysis.  Location: Superolateral, Superomedial, and Inferomedial aspects of knee joint.  Region: Lateral, Anterior, and Medial aspects of the knee joint, above and below the knee joint proper. Approach: Percutaneous  Type of procedure: Percutaneous perineural nerve block. The genicular nerve block is a motor-sparing technique that anesthetizes the sensory terminal branches innervating the knee joint, resulting in anesthesia of the anterior compartment of the knee. The distribution of anesthesia of each nerve is mostly in the corresponding quadrant.  Neuroanatomy: The superolateral genicular nerve (SLGN) courses around the femur shaft to pass between the vastus lateralis and the lateral epicondyle. It accompanies the superior lateral genicular artery. The superomedial genicular nerve (SMGN) courses around the femur shaft, following the superior medial genicular artery, to pass between the adductor magnus tendon and the medial epicondyle below the vastus medialis. The inferolateral genicular nerve (ILGN) courses around the tibial lateral epicondyle deep to the lateral collateral ligament, following the inferior lateral genicular artery, superior of the fibula head. The inferomedial genicular nerve (IMGN) courses horizontally below the medial collateral ligament between the tibial medial epicondyle and the insertion of the collateral ligament. It accompanies the inferior medial genicular artery. The recurrent peroneal nerve originates in the inferior popliteal region from the common peroneal nerve and courses horizontally around the fibula to pass just inferior of the fibula head and travel superior to the anterolateral tibial epicondyle. It accompanies the recurrent tibial  artery.  Position / Prep / Materials:  Position: Supine, Modified  Fowler's position with pillows under the targeted knee(s). The patient is placed in a supine position with the knee slightly flexed by placing a pillow in the popliteal fossa. Prep solution: ChloraPrep (2% chlorhexidine  gluconate and 70% isopropyl alcohol ) Prep Area: Entire knee area, from mid-thigh to mid-shin, lateral, anterior, and medial aspects. Materials:  Tray: Block Needle(s):  Type: Spinal  Gauge (G): 22  Length: 3.5-in  Qty: 3  H&P (Pre-op Assessment):  Heather Sweeney is a 58 y.o. (year old), female patient, seen today for interventional treatment. She  has a past surgical history that includes Abdominal hysterectomy (07/08/2011); Cholecystectomy (N/A, 01/12/2018); Foot surgery; Cesarean section; Dilation and curettage of uterus; LEEP; Tubal ligation; Carpal tunnel release; Colonoscopy with propofol  (N/A, 04/15/2019); Esophagogastroduodenoscopy (egd) with propofol  (N/A, 10/23/2020); Flexible sigmoidoscopy (N/A, 02/15/2021); Panniculectomy (N/A, 04/24/2022); Right hand carpal tunnel release (Right, 10/2022); scar tissue repair (04/2023); and Esophagogastroduodenoscopy (egd) with propofol  (N/A, 11/05/2023). Heather Sweeney has a current medication list which includes the following prescription(s): accu-chek guide, albuterol , amlodipine , abilify maintena, benztropine , accu-chek guide me, buprenorphine , belbuca , bupropion, buspirone, duloxetine , epinephrine , famotidine , fluoxetine , fluphenazine , fluticasone , hydroxyzine , insulin  pen needle, lidocaine , metoprolol  succinate, multiple vitamins-minerals, omalizumab , omeprazole , ondansetron , zepbound , and topiramate . Her primarily concern today is the Knee Pain (Right knee)  Initial Vital Signs:  Pulse/HCG Rate: 71ECG Heart Rate: 69 Temp: (!) 97.5 F (36.4 C) Resp: 16 BP: (!) 140/81 SpO2: 98 %  BMI: Estimated body mass index is 47.65 kg/m as calculated from the following:   Height as  of this encounter: 5' 3 (1.6 m).   Weight as of this encounter: 269 lb (122 kg).  Risk Assessment: Allergies: Reviewed. She is allergic to hydrochlorothiazide , keflex [cephalexin], lisinopril, losartan , penicillins, bee venom, and metformin .  Allergy Precautions: None required Coagulopathies: Reviewed. None identified.  Blood-thinner therapy: None at this time Active Infection(s): Reviewed. None identified. Heather Sweeney is afebrile  Site Confirmation: Heather Sweeney was asked to confirm the procedure and laterality before marking the site Procedure checklist: Completed Consent: Before the procedure and under the influence of no sedative(s), amnesic(s), or anxiolytics, the patient was informed of the treatment options, risks and possible complications. To fulfill our ethical and legal obligations, as recommended by the American Medical Association's Code of Ethics, I have informed the patient of my clinical impression; the nature and purpose of the treatment or procedure; the risks, benefits, and possible complications of the intervention; the alternatives, including doing nothing; the risk(s) and benefit(s) of the alternative treatment(s) or procedure(s); and the risk(s) and benefit(s) of doing nothing. The patient was provided information about the general risks and possible complications associated with the procedure. These may include, but are not limited to: failure to achieve desired goals, infection, bleeding, organ or nerve damage, allergic reactions, paralysis, and death. In addition, the patient was informed of those risks and complications associated to the procedure, such as failure to decrease pain; infection; bleeding; organ or nerve damage with subsequent damage to sensory, motor, and/or autonomic systems, resulting in permanent pain, numbness, and/or weakness of one or several areas of the body; allergic reactions; (i.e.: anaphylactic reaction); and/or death. Furthermore, the patient was  informed of those risks and complications associated with the medications. These include, but are not limited to: allergic reactions (i.e.: anaphylactic or anaphylactoid reaction(s)); adrenal axis suppression; blood sugar elevation that in diabetics may result in ketoacidosis or comma; water retention that in patients with history of congestive heart failure may result in shortness of breath, pulmonary edema, and decompensation with resultant heart failure;  weight gain; swelling or edema; medication-induced neural toxicity; particulate matter embolism and blood vessel occlusion with resultant organ, and/or nervous system infarction; and/or aseptic necrosis of one or more joints. Finally, the patient was informed that Medicine is not an exact science; therefore, there is also the possibility of unforeseen or unpredictable risks and/or possible complications that may result in a catastrophic outcome. The patient indicated having understood very clearly. We have given the patient no guarantees and we have made no promises. Enough time was given to the patient to ask questions, all of which were answered to the patient's satisfaction. Heather Sweeney has indicated that she wanted to continue with the procedure. Attestation: I, the ordering provider, attest that I have discussed with the patient the benefits, risks, side-effects, alternatives, likelihood of achieving goals, and potential problems during recovery for the procedure that I have provided informed consent. Date  Time: 06/01/2024  2:16 PM  Pre-Procedure Preparation:  Monitoring: As per clinic protocol. Respiration, ETCO2, SpO2, BP, heart rate and rhythm monitor placed and checked for adequate function Safety Precautions: Patient was assessed for positional comfort and pressure points before starting the procedure. Time-out: I initiated and conducted the Time-out before starting the procedure, as per protocol. The patient was asked to participate by  confirming the accuracy of the Time Out information. Verification of the correct person, site, and procedure were performed and confirmed by me, the nursing staff, and the patient. Time-out conducted as per Joint Commission's Universal Protocol (UP.01.01.01). Time: 1500 Start Time: 1500 hrs.  Description/Narrative of Procedure:          Rationale (medical necessity): procedure needed and proper for the diagnosis and/or treatment of the patient's medical symptoms and needs. Procedural Technique Safety Precautions: Aspiration looking for blood return was conducted prior to all injections. At no point did we inject any substances, as a needle was being advanced. No attempts were made at seeking any paresthesias. Safe injection practices and needle disposal techniques used. Medications properly checked for expiration dates. SDV (single dose vial) medications used. Description of the Procedure: Protocol guidelines were followed. The patient was assisted into a comfortable position. The target area was identified and the area prepped in the usual manner. Skin & deeper tissues infiltrated with local anesthetic. Appropriate amount of time allowed to pass for local anesthetics to take effect. The procedure needles were then advanced to the target area. Proper needle placement secured. Negative aspiration confirmed. Solution injected in intermittent fashion, asking for systemic symptoms every 0.5cc of injectate. The needles were then removed and the area cleansed, making sure to leave some of the prepping solution back to take advantage of its long term bactericidal properties.  9 cc solution made of 8 cc of 0.2% ropivacaine , 1 cc of Decadron  10 mg/cc.  3 cc injected for each of the genicular nerves for the left knee 9 cc solution made of 8 cc of 0.2% ropivacaine , 1 cc of Decadron  10 mg/cc.  3 cc injected for each of the genicular nerves for the right knee             Vitals:   06/01/24 1423 06/01/24  1500 06/01/24 1505  BP: (!) 140/81 (!) 149/78   Pulse: 71    Resp:  16 14  Temp: (!) 97.5 F (36.4 C)    SpO2: 98% 99% 98%  Weight: 269 lb (122 kg)    Height: 5' 3 (1.6 m)       Start Time: 1500 hrs. End Time:  hrs.  Imaging Guidance (Non-Spinal):          Type of Imaging Technique: Fluoroscopy Guidance (Non-Spinal) Indication(s): Fluoroscopy guidance for needle placement to enhance accuracy in procedures requiring precise needle localization for targeted delivery of medication in or near specific anatomical locations not easily accessible without such real-time imaging assistance. Exposure Time: Please see nurses notes. Contrast: None used. Fluoroscopic Guidance: I was personally present during the use of fluoroscopy. Tunnel Vision Technique used to obtain the best possible view of the target area. Parallax error corrected before commencing the procedure. Direction-depth-direction technique used to introduce the needle under continuous pulsed fluoroscopy. Once target was reached, antero-posterior, oblique, and lateral fluoroscopic projection used confirm needle placement in all planes. Images permanently stored in EMR. Interpretation: No contrast injected. I personally interpreted the imaging intraoperatively. Adequate needle placement confirmed in multiple planes. Permanent images saved into the patient's record.  Post-operative Assessment:  Post-procedure Vital Signs:  Pulse/HCG Rate: 7168 Temp: (!) 97.5 F (36.4 C) Resp: 14 BP: (!) 149/78 SpO2: 98 %  EBL: None  Complications: No immediate post-treatment complications observed by team, or reported by patient.  Note: The patient tolerated the entire procedure well. A repeat set of vitals were taken after the procedure and the patient was kept under observation following institutional policy, for this type of procedure. Post-procedural neurological assessment was performed, showing return to baseline, prior to discharge. The  patient was provided with post-procedure discharge instructions, including a section on how to identify potential problems. Should any problems arise concerning this procedure, the patient was given instructions to immediately contact us , at any time, without hesitation. In any case, we plan to contact the patient by telephone for a follow-up status report regarding this interventional procedure.  Comments:  No additional relevant information.  Plan of Care (POC)  PMP checked and reviewed.  Increase Belbuca  to 300 mcg twice daily.  Orders:  Orders Placed This Encounter  Procedures   DG PAIN CLIN[facility] C-ARM 1-60 MIN NO REPORT    Intraoperative interpretation by procedural physician at [facility].    Standing Status:   Standing    Number of Occurrences:   1    Reason for exam::   Assistance in needle guidance and placement for procedures requiring needle placement in or near specific anatomical locations not easily accessible without such assistance.    Medications ordered for procedure: Meds ordered this encounter  Medications   lidocaine  (XYLOCAINE ) 2 % (with pres) injection 400 mg   diazepam  (VALIUM ) tablet 5 mg    Make sure Flumazenil is available in the pyxis when using this medication. If oversedation occurs, administer 0.2 mg IV over 15 sec. If after 45 sec no response, administer 0.2 mg again over 1 min; may repeat at 1 min intervals; not to exceed 4 doses (1 mg)   ropivacaine  (PF) 2 mg/mL (0.2%) (NAROPIN ) injection 18 mL   dexamethasone  (DECADRON ) injection 20 mg   Buprenorphine  HCl (BELBUCA ) 300 MCG FILM    Sig: Place 1 Film inside cheek every 12 (twelve) hours.    Dispense:  60 Film    Refill:  0    Chronic Pain: STOP Act (Not applicable) Fill 1 day early if closed on refill date. Avoid benzodiazepines within 8 hours of opioids   Medications administered: We administered lidocaine , diazepam , ropivacaine  (PF) 2 mg/mL (0.2%), and dexamethasone .  See the medical record  for exact dosing, route, and time of administration.    Bilateral genicular nerve block 06/01/2024  Follow-up plan:   Return in about 4 weeks (around 06/29/2024) for PPE F2F.     Recent Visits Date Type Provider Dept  05/17/24 Office Visit Marcelino Nurse, MD [facility]  Showing recent visits within past 90 days and meeting all other requirements Today's Visits Date Type Provider Dept  06/01/24 Procedure visit Marcelino Nurse, MD [facility]  Showing today's visits and meeting all other requirements Future Appointments No visits were found meeting these conditions. Showing future appointments within next 90 days and meeting all other requirements   Disposition: Discharge home  Discharge (Date  Time): 06/01/2024; 1515 hrs.   Primary Care Physician: Dineen Rollene MATSU, FNP Location: [facility] Note by: Nurse Marcelino, MD (TTS technology used. I apologize for any typographical errors that were not detected and corrected.) Date: 06/01/2024; Time: 3:17 PM  Disclaimer:  Medicine is not an Visual merchandiser. The only guarantee in medicine is that nothing is guaranteed. It is important to note that the decision to proceed with this intervention was based on the information collected from the patient. The Data and conclusions were drawn from the patient's questionnaire, the interview, and the physical examination. Because the information was provided in large part by the patient, it cannot be guaranteed that it has not been purposely or unconsciously manipulated. Every effort has been made to obtain as much relevant data as possible for this evaluation. It is important to note that the conclusions that lead to this procedure are derived in large part from the available data. Always take into account that the treatment will also be dependent on availability of resources and existing treatment guidelines, considered by other Pain Management Practitioners as  being common knowledge and practice, at the time of the intervention. For Medico-Legal purposes, it is also important to point out that variation in procedural techniques and pharmacological choices are the acceptable norm. The indications, contraindications, technique, and results of the above procedure should only be interpreted and judged by a Board-Certified Interventional Pain Specialist with extensive familiarity and expertise in the same exact procedure and technique.

## 2024-06-01 NOTE — Patient Instructions (Signed)

## 2024-06-02 ENCOUNTER — Telehealth: Payer: Self-pay

## 2024-06-02 NOTE — Telephone Encounter (Signed)
 Post procedure follow up. Patient states she is doing fine.

## 2024-06-02 NOTE — Assessment & Plan Note (Addendum)
 Reviewed emergency room visit, bilateral ultrasound, chest x-ray.  Prescription given for compression stockings today.  Pending echocardiogram.No gross symptoms to raise suspicion for acute fluid volume overload today.

## 2024-06-02 NOTE — Assessment & Plan Note (Signed)
 Diagnosed moderate OSA for/2025, Dr. Jess, pulmonology.  Discussed Zepbound  for treatment of moderate sleep apnea, obesity.  Counseled on mechanism of action, blackbox warning, side effects and titration.  Start Zepbound  2.5 mg weekly.  Close follow-up

## 2024-06-02 NOTE — Assessment & Plan Note (Deleted)
 Murmur on exam.  Bilateral lower extremity edema.  Pending echocardiogram.  No gross symptoms to raise suspicion for acute fluid volume overload today.

## 2024-06-02 NOTE — Telephone Encounter (Signed)
 Patient called and states that she has swelling from her knee to the heel.  She says it is a little bit but she can't get her brace on.  Denies redness, heat or pain.  Just wanted to let you know.

## 2024-06-02 NOTE — Assessment & Plan Note (Signed)
 BP alightly elevated today.  She is not taking amlodipine  5 mg yet today.  Advised to do so when she gets home.  Concerned amlodipine  may be exacerbating bilateral extremity edema.  History of angioedema.  Will avoid indefinitely ARB, ACE inhibitor.  Rash with hydrochlorothiazide .  May consider Lasix  after echocardiogram if needed.  Continue amlodipine  5 mg daily for now.  Consider spironolactone 

## 2024-06-08 ENCOUNTER — Ambulatory Visit: Payer: Self-pay

## 2024-06-08 ENCOUNTER — Encounter: Payer: Self-pay | Admitting: Internal Medicine

## 2024-06-08 ENCOUNTER — Ambulatory Visit: Admitting: Internal Medicine

## 2024-06-08 VITALS — BP 146/72 | HR 88 | Ht 63.0 in | Wt 262.0 lb

## 2024-06-08 DIAGNOSIS — R350 Frequency of micturition: Secondary | ICD-10-CM | POA: Diagnosis not present

## 2024-06-08 DIAGNOSIS — R609 Edema, unspecified: Secondary | ICD-10-CM

## 2024-06-08 MED ORDER — FUROSEMIDE 20 MG PO TABS: 20.0000 mg | ORAL_TABLET | Freq: Every day | ORAL | 3 refills | Status: AC

## 2024-06-08 NOTE — Patient Instructions (Signed)
 I am adding a fluid pill called furosemide  to take once daily in the morning.  IT WILL MAKE YOU URINATE A LOT!!!   RETURN IN ONE WEEK FOR A LAB APPT TO CHECK YOUR POTASSIUM AND KIDNEY FUNCTION

## 2024-06-08 NOTE — Telephone Encounter (Signed)
  FYI Only or Action Required?: FYI only for provider.  Patient was last seen in primary care on 05/30/2024 by Dineen Rollene MATSU, FNP.  Called Nurse Triage reporting Foot Swelling and Bloated.  Symptoms began a week ago.  Interventions attempted: Nothing.  Symptoms are: gradually worsening.  Triage Disposition: See Physician Within 24 Hours  Patient/caregiver understands and will follow disposition?: Yes  Copied from CRM #[id number]. Topic: Clinical - Red Word Triage >> Jun 08, 2024  9:57 AM Thersia BROCKS wrote: [name] that prompted transfer to Nurse Triage: patient called in and stated her whole body is swollen, like shes bloated, feet is swollen Reason for Disposition  [1] MODERATE leg swelling (e.g., swelling extends up to knees) AND [2] new-onset or getting worse  Answer Assessment - Initial Assessment Questions 1. ONSET: When did the swelling start? (e.g., minutes, hours, days)     One week ago 2. LOCATION: What part of the leg is swollen?  Are both legs swollen or just one leg?     Feet and extending up to calf 3. SEVERITY: How bad is the swelling? (e.g., localized; mild, moderate, severe)     Describes pitting edema 4. REDNESS: Is there redness or signs of infection?     States that her legs are bruised 5. PAIN: Is the swelling painful to touch? If Yes, ask: How painful is it?   (Scale 1-10; mild, moderate or severe)     Painful when walking 6. FEVER: Do you have a fever? If Yes, ask: What is it, how was it measured, and when did it start?      denies 7. CAUSE: What do you think is causing the leg swelling?     unknown 8. MEDICAL HISTORY: Do you have a history of blood clots (e.g., DVT), cancer, heart failure, kidney disease, or liver failure?     denies 9. RECURRENT SYMPTOM: Have you had leg swelling before? If Yes, ask: When was the last time? What happened that time?     Denies 10. OTHER SYMPTOMS: Do you have any other symptoms? (e.g., chest  pain, difficulty breathing)       denies 11. PREGNANCY: Is there any chance you are pregnant? When was your last menstrual period?       N/A  Protocols used: Leg Swelling and Edema-A-AH

## 2024-06-08 NOTE — Progress Notes (Signed)
 Subjective:  Patient ID: Heather Sweeney, female    DOB: 1966-06-11  Age: 58 y.o. MRN: [id number]  CC: The primary encounter diagnosis was Increased frequency of urination. Diagnoses of Edema, unspecified type and 2+ pitting edema were also pertinent to this visit.   HPI [name] presents for  Chief Complaint  Patient presents with   Foot Swelling    Bilateral foot swelling   57 YR OLD FEMALE WITH HISTORY OF GAD/DEPRESSION, lymphedema, CHRONIC PAIN,  OBESITY/OSA AND HYPERTENSION RETURNS FOR ONE WEEK FOLLOW UP ON BILATERAL LOWER EXTREMITY EDEMA.  WAS EVALUATED I N ER ON AUGUST 16 AND RELEASED  . SAW  PCP MARGARET ON AUGUST 25,  BP WAS ELEVATED AND AMLODIPINE  WAS ADVISED GIVEN HISTORY OF ANGIOEDEMA WITH ACE/ARB,  and h/o RASH WITH hydrochlorothiazide .     OBESITY:  ZEPBOUND  PRESCRIBED LAST WEEK   BUT PHARMACY COULD NOT FILL  . She has had  7 LB WT LOSS SINCE LAST WEEK BY OUR SCALES BUT NOTES 2 + PITTING EDEMA. TO HER LOWER EXTREMITY AND UNEXPLAINED BRUISING ON BOTH LEGS  which started last week after her   visit with Rollene  .  DENIES GUMS BLEEDING   HAD A PROCEDURE ON BOTH KNEES 6 DAYS AGO   Outpatient Medications Prior to Visit  Medication Sig Dispense Refill   albuterol  (VENTOLIN  HFA) 108 (90 Base) MCG/ACT inhaler Inhale 1-2 puffs into the lungs every 6 (six) hours as needed for wheezing or shortness of breath. 18 g 11   amLODipine  (NORVASC ) 5 MG tablet Take 1 tablet (5 mg total) by mouth daily. 90 tablet 3   benztropine  (COGENTIN ) 1 MG tablet Take 1 mg by mouth 2 (two) times daily.     buprenorphine  (BUTRANS) 15 MCG/HR 1 patch once a week.     Buprenorphine  HCl (BELBUCA ) 300 MCG FILM Place 1 Film inside cheek every 12 (twelve) hours. 60 Film 0   buPROPion (WELLBUTRIN XL) 300 MG 24 hr tablet Take 300 mg by mouth daily.     busPIRone (BUSPAR) 15 MG tablet Take 15 mg by mouth 2 (two) times daily.     EPINEPHrine  0.3 mg/0.3 mL IJ SOAJ injection Inject 0.3 mg into the muscle as  needed for anaphylaxis. 2 each 3   famotidine  (PEPCID ) 40 MG tablet TAKE ONE TABLET BY MOUTH ONCE EVERY DAY 120 tablet 11   FLUoxetine  (PROZAC ) 40 MG capsule Take 40 mg by mouth daily. (Patient taking differently: Take 40 mg by mouth daily. Takes 60 mg daily)     fluPHENAZine  (PROLIXIN ) 5 MG tablet Take 5 mg by mouth 2 (two) times daily.     fluticasone  (FLONASE ) 50 MCG/ACT nasal spray INSTILL 2 SPRAYS IN EACH NOSTRIL ONCE DAILY 16 g 10   Insulin  Pen Needle (NOVOFINE) 30G X 8 MM MISC Use with victoza  daily 1.8 mg 90 each 3   metoprolol  succinate (TOPROL -XL) 25 MG 24 hr tablet Take 25 mg by mouth daily.     Multiple Vitamins-Minerals (ALIVE MULTI-VITAMIN PO) Take by mouth daily in the afternoon.     omeprazole  (PRILOSEC) 40 MG capsule TAKE 1 CAPSULE BY MOUTH ONCE EVERY DAY 120 capsule 11   ondansetron  (ZOFRAN -ODT) 4 MG disintegrating tablet Take 1 tablet (4 mg total) by mouth every 8 (eight) hours as needed for nausea or vomiting. 20 tablet 0   tirzepatide  (ZEPBOUND ) 2.5 MG/0.5ML Pen Inject 2.5 mg into the skin once a week. 2 mL 1   ACCU-CHEK GUIDE test strip Use as  instructed 100 strip 3   ARIPiprazole ER (ABILIFY MAINTENA) 300 MG SRER injection as directed Intramuscular every two weeks (Patient not taking: Reported on 06/08/2024)     Blood Glucose Monitoring Suppl (ACCU-CHEK GUIDE ME) w/Device KIT Use as directed,once daily to test blood sugar 1 kit 0   DULoxetine  (CYMBALTA ) 20 MG capsule Take 1 capsule (20 mg total) by mouth daily. (Patient not taking: Reported on 06/08/2024) 90 capsule 3   hydrOXYzine  (ATARAX ) 50 MG tablet Oral (Patient not taking: Reported on 06/08/2024)     lidocaine  (LIDODERM ) 5 % Place 1 patch onto the skin every 12 (twelve) hours. Remove & Discard patch within 12 hours or as directed by MD.  Ginnie the patch off for 12 hours before applying a new one. (Patient not taking: Reported on 06/08/2024) 30 patch 0   omalizumab  (XOLAIR ) 150 MG/ML prefilled syringe  (Patient not taking:  Reported on 06/08/2024)     topiramate  (TOPAMAX ) 25 MG tablet Take 1 tablet (25 mg total) by mouth at bedtime. (Patient not taking: Reported on 06/08/2024) 90 tablet 3   No facility-administered medications prior to visit.    Review of Systems;  Patient denies headache, fevers, malaise, unintentional weight loss, skin rash, eye pain, sinus congestion and sinus pain, sore throat, dysphagia,  hemoptysis , cough, dyspnea, wheezing, chest pain, palpitations, orthopnea, abdominal pain, nausea, melena, diarrhea, constipation, flank pain, dysuria, hematuria, urinary  Frequency, nocturia, numbness, tingling, seizures,  Focal weakness, Loss of consciousness,  Tremor, insomnia, depression, anxiety, and suicidal ideation.      Objective:  BP (!) 146/72   Pulse 88   Ht 5' 3 (1.6 m)   Wt 262 lb (118.8 kg)   SpO2 99%   BMI 46.41 kg/m   BP Readings from Last 3 Encounters:  06/08/24 (!) 146/72  06/01/24 (!) 149/78  05/30/24 (!) 150/90    Wt Readings from Last 3 Encounters:  06/08/24 262 lb (118.8 kg)  06/01/24 269 lb (122 kg)  05/30/24 260 lb 3.2 oz (118 kg)    Physical Exam Vitals reviewed.  Constitutional:      General: She is not in acute distress.    Appearance: Normal appearance. She is obese. She is not ill-appearing, toxic-appearing or diaphoretic.  HENT:     Head: Normocephalic.  Eyes:     General: No scleral icterus.       Right eye: No discharge.        Left eye: No discharge.     Conjunctiva/sclera: Conjunctivae normal.  Cardiovascular:     Rate and Rhythm: Normal rate and regular rhythm.     Heart sounds: Normal heart sounds.  Pulmonary:     Effort: Pulmonary effort is normal. No respiratory distress.     Breath sounds: Normal breath sounds.  Musculoskeletal:        General: Normal range of motion.     Right lower leg: Edema present.     Left lower leg: Edema present.  Skin:    General: Skin is warm and dry.  Neurological:     General: No focal deficit present.      Mental Status: She is alert and oriented to person, place, and time. Mental status is at baseline.  Psychiatric:        Mood and Affect: Mood normal.        Behavior: Behavior normal.        Thought Content: Thought content normal.        Judgment: Judgment normal.  Lab Results  Component Value Date   HGBA1C 5.3 09/22/2023   HGBA1C 5.2 07/10/2022   HGBA1C 5.2 08/01/2021    Lab Results  Component Value Date   CREATININE 1.13 (H) 05/21/2024   CREATININE 1.14 (H) 05/20/2024   CREATININE 1.12 02/24/2024    Lab Results  Component Value Date   WBC 5.7 05/21/2024   HGB 12.2 05/21/2024   HCT 34.2 (L) 05/21/2024   PLT 207 05/21/2024   GLUCOSE 96 05/21/2024   CHOL 147 09/22/2023   TRIG 76.0 09/22/2023   HDL 57.90 09/22/2023   LDLCALC 74 09/22/2023   ALT 18 05/20/2024   AST 20 05/20/2024   NA 137 05/21/2024   K 4.2 05/21/2024   CL 103 05/21/2024   CREATININE 1.13 (H) 05/21/2024   BUN 11 05/21/2024   CO2 25 05/21/2024   TSH 1.21 09/22/2023   HGBA1C 5.3 09/22/2023   MICROALBUR <0.2 02/24/2019    DG PAIN CLINIC C-ARM 1-60 MIN NO REPORT Result Date: 06/01/2024 Fluoro was used, but no Radiologist interpretation will be provided. Please refer to NOTES tab for provider progress note.   Assessment & Plan:  .Increased frequency of urination -     Urinalysis, Routine w reflex microscopic  Edema, unspecified type -     Basic metabolic panel with GFR; Future  2+ pitting edema Assessment & Plan: Screening urinalysis was negative for protein.  Patinet was given rx for furosemide  20 mg daily and advised to RTC for bmet in one week    Other orders -     Furosemide ; Take 1 tablet (20 mg total) by mouth daily.  Dispense: 30 tablet; Refill: 3   I personally spent a total of 30 minutes in the care of the patient today including preparing to see the patient, getting/reviewing separately obtained history, performing a medically appropriate exam/evaluation, counseling and  educating, placing orders, and communicating results.  Follow-up: Return in about 4 weeks (around 07/06/2024) for hypertension.   Verneita LITTIE Kettering, MD

## 2024-06-09 ENCOUNTER — Ambulatory Visit: Payer: Self-pay | Admitting: Internal Medicine

## 2024-06-09 LAB — URINALYSIS, ROUTINE W REFLEX MICROSCOPIC
Bilirubin Urine: NEGATIVE
Hgb urine dipstick: NEGATIVE
Ketones, ur: NEGATIVE
Leukocytes,Ua: NEGATIVE
Nitrite: NEGATIVE
RBC / HPF: NONE SEEN (ref 0–?)
Specific Gravity, Urine: <=1.005 — AB (ref 1.000–1.030)
Total Protein, Urine: NEGATIVE
Urine Glucose: NEGATIVE
Urobilinogen, UA: 0.2 (ref 0.0–1.0)
pH: 6 (ref 5.0–8.0)

## 2024-06-11 DIAGNOSIS — R609 Edema, unspecified: Secondary | ICD-10-CM | POA: Insufficient documentation

## 2024-06-11 NOTE — Assessment & Plan Note (Signed)
 Screening urinalysis was negative for protein.  Patinet was given rx for furosemide  20 mg daily and advised to RTC for bmet in one week

## 2024-06-15 ENCOUNTER — Ambulatory Visit: Admitting: Family

## 2024-06-15 ENCOUNTER — Other Ambulatory Visit (INDEPENDENT_AMBULATORY_CARE_PROVIDER_SITE_OTHER)

## 2024-06-15 DIAGNOSIS — R609 Edema, unspecified: Secondary | ICD-10-CM | POA: Diagnosis not present

## 2024-06-15 LAB — BASIC METABOLIC PANEL WITH GFR
BUN: 12 mg/dL (ref 6–23)
CO2: 26 meq/L (ref 19–32)
Calcium: 9.4 mg/dL (ref 8.4–10.5)
Chloride: 104 meq/L (ref 96–112)
Creatinine, Ser: 1.23 mg/dL — ABNORMAL HIGH (ref 0.40–1.20)
GFR: 49.3 mL/min — ABNORMAL LOW (ref >60.00)
Glucose, Bld: 96 mg/dL (ref 70–99)
Potassium: 3.7 meq/L (ref 3.5–5.1)
Sodium: 138 meq/L (ref 135–145)

## 2024-06-17 ENCOUNTER — Telehealth: Payer: Self-pay

## 2024-06-17 ENCOUNTER — Other Ambulatory Visit: Payer: Self-pay | Admitting: Internal Medicine

## 2024-06-17 NOTE — Telephone Encounter (Signed)
 Called pt to get more information but vm is full unable to leave a message to return call to office.

## 2024-06-17 NOTE — Telephone Encounter (Signed)
 Copied from CRM [id number]. Topic: General - Other >> Jun 16, 2024  5:08 PM Heather Sweeney wrote: Reason for CRM: Pt calling to follow up on home health supplies request for diaper, wipes, pads, gloves. Pt said the company sent the request to be signed by provider. She could not remember the name of the company but said she spoke with someone named Ambulance person. Pls call pt for additional information.

## 2024-06-20 NOTE — Telephone Encounter (Signed)
 SPOKE TO PT GAVE HER OUR FAX NUMBER SO THEY CAN FAX PAPERWORK TO US  TO BE FILLED OUT

## 2024-06-22 ENCOUNTER — Ambulatory Visit: Admitting: Internal Medicine

## 2024-06-22 ENCOUNTER — Ambulatory Visit: Payer: Self-pay

## 2024-06-22 NOTE — Telephone Encounter (Signed)
 FYI Only or Action Required?: Action required by provider: request for appointment.  Patient was last seen in primary care on 06/08/2024 by Marylynn Verneita CROME, MD.  Called Nurse Triage reporting Fall.  Symptoms began several days ago.  Interventions attempted: OTC medications: tylenol .  Symptoms are: unchanged.  Triage Disposition: See HCP Within 4 Hours (Or PCP Triage)  Patient/caregiver understands and will follow disposition?: YesCopied from CRM #[id number]. Topic: Clinical - Red Word Triage >> Jun 22, 2024  9:12 AM Pinkey ORN wrote: Red Word that prompted transfer to Nurse Triage: Fall >> Jun 22, 2024  9:13 AM Pinkey ORN wrote: Patient states she experienced a fall on Saturday, patient states her right side is swollen and aching. Patient also mentions her lower back is bothering her as well.  Reason for Disposition  [1] MODERATE weakness (e.g., interferes with work, school, normal activities) AND [2] new-onset or getting worse  Answer Assessment - Initial Assessment Questions Pt tripped Sunday and fell on right side. Pt has swelling and bruising. Pt already had hospital f/u scheduled tomorrow but wanted to be seen today for fall. Pt has tried tylenol  for pain.     1. MECHANISM: How did the fall happen?     Stepping over drain and left foot slipped. Fell on right side 2. DOMESTIC VIOLENCE AND ELDER ABUSE SCREENING: Did you fall because someone pushed you or tried to hurt you? If Yes, ask: Are you safe now?     na 3. ONSET: When did the fall happen? (e.g., minutes, hours, or days ago)     Sunday  4. LOCATION: What part of the body hit the ground? (e.g., back, buttocks, head, hips, knees, hands, head, stomach)     Right side 5. INJURY: Did you hurt (injure) yourself when you fell? If Yes, ask: What did you injure? Tell me more about this? (e.g., body area; type of injury; pain severity)     Back hurts 6. PAIN: Is there any pain? If Yes, ask: How bad is the pain?  (e.g., Scale 0-10; or none, mild,      9 7. SIZE: For cuts, bruises, or swelling, ask: How large is it? (e.g., inches or centimeters)      Swelling and bruises on right leg  9. OTHER SYMPTOMS: Do you have any other symptoms? (e.g., dizziness, fever, weakness; new-onset or worsening).      weakness 10. CAUSE: What do you think caused the fall (or falling)? (e.g., dizzy spell, tripped)       tripped  Protocols used: [facility]

## 2024-06-23 ENCOUNTER — Telehealth: Payer: Self-pay | Admitting: Family

## 2024-06-23 ENCOUNTER — Ambulatory Visit: Admitting: Family

## 2024-06-23 ENCOUNTER — Other Ambulatory Visit (HOSPITAL_COMMUNITY): Payer: Self-pay

## 2024-06-23 ENCOUNTER — Encounter: Payer: Self-pay | Admitting: Family

## 2024-06-23 ENCOUNTER — Ambulatory Visit

## 2024-06-23 ENCOUNTER — Telehealth: Payer: Self-pay

## 2024-06-23 VITALS — BP 138/76 | HR 85 | Temp 98.0°F | Ht 63.0 in | Wt 264.4 lb

## 2024-06-23 DIAGNOSIS — M25561 Pain in right knee: Secondary | ICD-10-CM

## 2024-06-23 DIAGNOSIS — I10 Essential (primary) hypertension: Secondary | ICD-10-CM | POA: Diagnosis not present

## 2024-06-23 DIAGNOSIS — G4733 Obstructive sleep apnea (adult) (pediatric): Secondary | ICD-10-CM

## 2024-06-23 DIAGNOSIS — M25551 Pain in right hip: Secondary | ICD-10-CM

## 2024-06-23 DIAGNOSIS — S0990XA Unspecified injury of head, initial encounter: Secondary | ICD-10-CM | POA: Diagnosis not present

## 2024-06-23 NOTE — Telephone Encounter (Signed)
 What is status of zepbound ?  Was this approved?

## 2024-06-23 NOTE — Patient Instructions (Addendum)
 Please continue tylenol  arthritis, and Buprenorphine  as prescribed by Dr Marcelino Calender today right knee and right hip.   I also ordered CT of your head.   Let us  know if you dont hear back within 2 weeks in regards to an appointment being scheduled.   So that you are aware, if you are [facility] MyChart user , please pay attention to your MyChart messages as you may receive a MyChart message with a phone number to call and schedule this test/appointment own your own from our referral coordinator. This is a new process so I do not want you to miss this message.  If you are not a MyChart user, you will receive a phone call.

## 2024-06-23 NOTE — Telephone Encounter (Signed)
 LVM and sent instructions below via my chart per Rollene

## 2024-06-23 NOTE — Telephone Encounter (Signed)
 Sheesh super quick approval.   Pharmacy Patient Advocate Encounter  Received notification from [facility] Medicare that Prior Authorization for Zepbound  2.5MG /0.5ML pen-injectors has been APPROVED from 06/23/2024 to 10/05/2024. Ran test claim, Copay is $0. This test claim was processed through [facility]- copay amounts may vary at other pharmacies due to pharmacy/plan contracts, or as the patient moves through the different stages of their insurance plan.   PA #/Case ID/Reference #: [id number]

## 2024-06-23 NOTE — Telephone Encounter (Signed)
 Pharmacy Patient Advocate Encounter   Received notification from Physician's Office that prior authorization for Zepbound  2.5MG /0.5ML pen-injectors is required/requested.   Insurance verification completed.   The patient is insured through [facility].   Per test claim: PA required; PA submitted to above mentioned insurance via Latent Key/confirmation #/EOC [id number] Status is pending

## 2024-06-23 NOTE — Telephone Encounter (Signed)
 Im not sure where a request was sent for us  to complete, but PA request has been Submitted. New Encounter has been or will be created for follow up. For additional info see Pharmacy Prior Auth telephone encounter from 06/23/2024.

## 2024-06-23 NOTE — Progress Notes (Signed)
 Assessment & Plan:  Right hip pain Assessment & Plan: Right hip pain after fall   Order an x-ray of the right hip to assess for fracture.Continue following with Dr Marcelino pain management. Discussed role of weight loss and have reached out to pharmacy team regarding Zepbound .   Orders: -     CT HEAD WO CONTRAST ( ); Future -     DG Knee Complete 4 Views Right; Future -     DG HIP UNILAT W OR W/O PELVIS 2-3 VIEWS RIGHT; Future  Injury of head, initial encounter -     CT HEAD WO CONTRAST ( ); Future  Essential hypertension Assessment & Plan: Chronic, slightly elevated today.  Concern pain may be aggravating. Continue amlodipine  5 mg, toprol  25mg  , lasix  20mg  daily.   Traumatic injury of head, initial encounter Assessment & Plan: Fall 2 weeks ago with blunt injury to head. Pending CT head.    Acute pain of right knee Assessment & Plan: Acute on chronic. Pending xray. continue tylenol  arthritis, and Buprenorphine  as prescribed by Dr Marcelino   OSA on CPAP Assessment & Plan: Start zepbound  2.5mg  and titrate.       Return precautions given.   Risks, benefits, and alternatives of the medications and treatment plan prescribed today were discussed, and patient expressed understanding.   Education regarding symptom management and diagnosis given to patient on AVS either electronically or printed.  Return in about 1 month (around 07/23/2024).  Rollene Northern, FNP  Subjective:    Patient ID: Heather Sweeney, female    DOB: [birth year], 58 y.o.   MRN: [id number]  Heather Sweeney is a 58 y.o. female who presents today for an acute visit.    HPI: HPI Discussed the use of AI scribe software for clinical note transcription with the patient, who gave verbal consent to proceed.  History of Present Illness   Heather Sweeney is a 58 year old female who presents with right hip pain following a fall.  She experienced right hip pain following a fall that occurred  approximately two weeks ago while she was at a hotel in [facility]. She tripped over a drain and fell on her right side in the parking lot, resulting in significant pain and swelling in the right hip area. She hit her right side of head on the pavement. She did not lose consciousness, or suffer a laceration.  Denies HA, sudden vision changes. She did not seek medical attention at the time of the incident.  She has a history of pain management for her knee and back and is currently receiving buprenorphine  film from pain management and has also been taking Tylenol . She received a nerve block prior to the fall, and the fall occurred after this procedure.  She experiences urinary incontinence, particularly at night, and has been wetting the bed. She has requested home health supplies such as diapers, wipes, pads, and gloves to manage this condition.  She is currently taking amlodipine  and Lasix  daily. Leg swelling improved.      She is not on an anticoagulant.                 Allergies: Hydrochlorothiazide , Keflex [cephalexin], Lisinopril, Losartan , Penicillins, Bee venom, and Metformin  Current Outpatient Medications on File Prior to Visit  Medication Sig Dispense Refill   albuterol  (VENTOLIN  HFA) 108 (90 Base) MCG/ACT inhaler Inhale 1-2 puffs into the lungs every 6 (six) hours as needed for wheezing or shortness of breath. 18 g 11  amLODipine  (NORVASC ) 5 MG tablet Take 1 tablet (5 mg total) by mouth daily. 90 tablet 3   benztropine  (COGENTIN ) 1 MG tablet Take 1 mg by mouth 2 (two) times daily.     Buprenorphine  HCl (BELBUCA ) 300 MCG FILM Place 1 Film inside cheek every 12 (twelve) hours. 60 Film 0   buPROPion (WELLBUTRIN XL) 300 MG 24 hr tablet Take 300 mg by mouth daily.     busPIRone (BUSPAR) 15 MG tablet Take 15 mg by mouth 2 (two) times daily.     famotidine  (PEPCID ) 40 MG tablet TAKE ONE TABLET BY MOUTH ONCE EVERY DAY 120 tablet 11   FLUoxetine  (PROZAC ) 40 MG capsule Take 40 mg by  mouth daily. (Patient taking differently: Take 40 mg by mouth daily. Takes 60 mg daily)     fluPHENAZine  (PROLIXIN ) 5 MG tablet Take 5 mg by mouth 2 (two) times daily.     fluticasone  (FLONASE ) 50 MCG/ACT nasal spray INSTILL 2 SPRAYS IN EACH NOSTRIL ONCE DAILY 16 g 10   furosemide  (LASIX ) 20 MG tablet Take 1 tablet (20 mg total) by mouth daily. 30 tablet 3   metoprolol  succinate (TOPROL -XL) 25 MG 24 hr tablet Take 1 tablet (25 mg total) by mouth daily. 90 tablet 3   omeprazole  (PRILOSEC) 40 MG capsule TAKE 1 CAPSULE BY MOUTH ONCE EVERY DAY 120 capsule 11   EPINEPHrine  0.3 mg/0.3 mL IJ SOAJ injection Inject 0.3 mg into the muscle as needed for anaphylaxis. (Patient not taking: Reported on 06/23/2024) 2 each 3   Insulin  Pen Needle (NOVOFINE) 30G X 8 MM MISC Use with victoza  daily 1.8 mg (Patient not taking: Reported on 06/23/2024) 90 each 3   Multiple Vitamins-Minerals (ALIVE MULTI-VITAMIN PO) Take by mouth daily in the afternoon. (Patient not taking: Reported on 06/23/2024)     ondansetron  (ZOFRAN -ODT) 4 MG disintegrating tablet Take 1 tablet (4 mg total) by mouth every 8 (eight) hours as needed for nausea or vomiting. (Patient not taking: Reported on 06/23/2024) 20 tablet 0   tirzepatide  (ZEPBOUND ) 2.5 MG/0.5ML Pen Inject 2.5 mg into the skin once a week. (Patient not taking: Reported on 06/23/2024) 2 mL 1   No current facility-administered medications on file prior to visit.    Review of Systems  Constitutional:  Negative for chills and fever.  Eyes:  Negative for visual disturbance.  Respiratory:  Negative for cough.   Cardiovascular:  Negative for chest pain and palpitations.  Gastrointestinal:  Negative for nausea and vomiting.  Musculoskeletal:  Positive for arthralgias and back pain.  Neurological:  Negative for dizziness and headaches.      Objective:    BP 138/76   Pulse 85   Temp 98 F (36.7 C) (Oral)   Ht 5' 3 (1.6 m)   Wt 264 lb 6.4 oz (119.9 kg)   SpO2 96%   BMI 46.84 kg/m    BP Readings from Last 3 Encounters:  06/23/24 138/76  06/08/24 (!) 146/72  06/01/24 (!) 149/78   Wt Readings from Last 3 Encounters:  06/23/24 264 lb 6.4 oz (119.9 kg)  06/08/24 262 lb (118.8 kg)  06/01/24 269 lb (122 kg)    Physical Exam Vitals reviewed.  Constitutional:      Appearance: She is well-developed.  Eyes:     Conjunctiva/sclera: Conjunctivae normal.  Cardiovascular:     Rate and Rhythm: Normal rate and regular rhythm.     Pulses: Normal pulses.     Heart sounds: Normal heart sounds.  Pulmonary:  Effort: Pulmonary effort is normal.     Breath sounds: Normal breath sounds. No wheezing, rhonchi or rales.  Musculoskeletal:     Lumbar back: No swelling, tenderness or bony tenderness. Normal range of motion. Negative right straight leg raise test and negative left straight leg raise test.     Right hip: No deformity or bony tenderness.     Left hip: No deformity, tenderness or bony tenderness.     Right knee: No swelling. Normal range of motion. Tenderness present over the lateral joint line. No medial joint line tenderness.     Comments: Right Hip: No limp or waddling gait. Pain with deep palpation of greater trochanter.  No ecchymosis, laceration. Bilateral knees are symmetric. No effusion appreciated. No increase in warmth or erythema. Crepitus felt with flexion of bilateral knees.  Right knee: She is wearing soft knee brace. Pain with palpation lateral joint line. Able to extend to -5 to 10 degrees and flex to 110 degrees. No catching with McMurray maneuver. No patellar apprehension.  No calf tenderness of lower leg edema bilaterally.      Skin:    General: Skin is warm and dry.  Neurological:     Mental Status: She is alert.  Psychiatric:        Speech: Speech normal.        Behavior: Behavior normal.        Thought Content: Thought content normal.

## 2024-06-23 NOTE — Telephone Encounter (Signed)
 Pharmacy Patient Advocate Encounter  Received notification from [facility] Medicare that Prior Authorization for Zepbound  2.5MG /0.5ML pen-injectors has been APPROVED from 06/23/2024 to 10/05/2024. Ran test claim, Copay is $0. This test claim was processed through [facility]- copay amounts may vary at other pharmacies due to pharmacy/plan contracts, or as the patient moves through the different stages of their insurance plan.   PA #/Case ID/Reference #: [id number]

## 2024-06-23 NOTE — Telephone Encounter (Signed)
 Maybe the system is acting wonky? But I will update once insurance makes a determination. :)

## 2024-06-23 NOTE — Telephone Encounter (Signed)
 What is status of echocardiogram being scheduled?

## 2024-06-23 NOTE — Telephone Encounter (Signed)
 Pt has been notified.

## 2024-06-24 ENCOUNTER — Ambulatory Visit: Payer: Self-pay | Admitting: Family

## 2024-06-24 DIAGNOSIS — G8929 Other chronic pain: Secondary | ICD-10-CM

## 2024-06-27 NOTE — Telephone Encounter (Signed)
 Called pt unable to lvm due to vm being full will try another time

## 2024-06-28 ENCOUNTER — Encounter: Payer: Self-pay | Admitting: Family

## 2024-06-28 DIAGNOSIS — S0990XA Unspecified injury of head, initial encounter: Secondary | ICD-10-CM | POA: Insufficient documentation

## 2024-06-28 NOTE — Assessment & Plan Note (Signed)
 Fall 2 weeks ago with blunt injury to head. Pending CT head.

## 2024-06-28 NOTE — Assessment & Plan Note (Signed)
 Start zepbound  2.5mg  and titrate.

## 2024-06-28 NOTE — Telephone Encounter (Unsigned)
 Copied from CRM #[id number]. Topic: General - Other >> Jun 28, 2024 12:59 PM Viola F wrote: Alethia from [employer] says a request for patients incontinence supplies was sent over and they only receieved 2 cover sheets. They need office notes stating patient inability of holding bladder or anything pertaining to that from the last 6 months. Please refax office note to [phone]

## 2024-06-28 NOTE — Assessment & Plan Note (Signed)
 Acute on chronic. Pending xray. continue tylenol  arthritis, and Buprenorphine  as prescribed by Dr Marcelino

## 2024-06-28 NOTE — Assessment & Plan Note (Addendum)
 Chronic, slightly elevated today.  Concern pain may be aggravating. Continue amlodipine  5 mg, toprol  25mg  , lasix  20mg  daily.

## 2024-06-28 NOTE — Assessment & Plan Note (Signed)
 Right hip pain after fall   Order an x-ray of the right hip to assess for fracture.Continue following with Dr Marcelino pain management. Discussed role of weight loss and have reached out to pharmacy team regarding Zepbound .

## 2024-06-30 ENCOUNTER — Telehealth: Payer: Self-pay | Admitting: Family

## 2024-06-30 ENCOUNTER — Ambulatory Visit
Admission: RE | Admit: 2024-06-30 | Discharge: 2024-06-30 | Disposition: A | Discharge status: Home / Self Care | Source: Ambulatory Visit | Attending: Family | Admitting: Family

## 2024-06-30 DIAGNOSIS — M25551 Pain in right hip: Secondary | ICD-10-CM | POA: Insufficient documentation

## 2024-06-30 DIAGNOSIS — S0990XA Unspecified injury of head, initial encounter: Secondary | ICD-10-CM | POA: Insufficient documentation

## 2024-06-30 NOTE — Telephone Encounter (Signed)
 Copied from CRM #[id number]. Topic: General - Other >> Jun 30, 2024  8:27 AM Alfonso HERO wrote: Reason for CRM: Amy from [employer] called to see if paperwork for patient was received. Please call her back at [phone].[email or web address]

## 2024-06-30 NOTE — Telephone Encounter (Signed)
 Office note from 06/08/24 efaxed

## 2024-07-01 NOTE — Telephone Encounter (Signed)
 Spoke to rep from [employer] they needed last ov notes faxed with form, faxed form  back  with  ov notes to them on 07/01/24

## 2024-07-04 ENCOUNTER — Encounter: Payer: Self-pay | Admitting: Nurse Practitioner

## 2024-07-04 ENCOUNTER — Ambulatory Visit: Attending: Nurse Practitioner | Admitting: Nurse Practitioner

## 2024-07-04 VITALS — BP 125/60 | HR 77 | Temp 97.3°F | Resp 18 | Ht 63.0 in | Wt 260.0 lb

## 2024-07-04 DIAGNOSIS — G8929 Other chronic pain: Secondary | ICD-10-CM | POA: Diagnosis present

## 2024-07-04 DIAGNOSIS — M170 Bilateral primary osteoarthritis of knee: Secondary | ICD-10-CM | POA: Insufficient documentation

## 2024-07-04 DIAGNOSIS — M25562 Pain in left knee: Secondary | ICD-10-CM | POA: Insufficient documentation

## 2024-07-04 DIAGNOSIS — Z79899 Other long term (current) drug therapy: Secondary | ICD-10-CM | POA: Diagnosis present

## 2024-07-04 DIAGNOSIS — M25561 Pain in right knee: Secondary | ICD-10-CM | POA: Diagnosis present

## 2024-07-04 DIAGNOSIS — G894 Chronic pain syndrome: Secondary | ICD-10-CM | POA: Diagnosis present

## 2024-07-04 MED ORDER — BELBUCA 300 MCG BU FILM: 1.0000 | ORAL_FILM | Freq: Two times a day (BID) | BUCCAL | 0 refills | Status: DC | PRN

## 2024-07-04 NOTE — Progress Notes (Signed)
 PROVIDER NOTE: Interpretation of information contained herein should be left to medically-trained personnel. Specific patient instructions are provided elsewhere under Patient Instructions section of medical record. This document was created in part using AI and STT-dictation technology, any transcriptional errors that may result from this process are unintentional.  Patient: Heather Sweeney  Service: E/M   PCP: Sampson Ethridge LABOR, MD  DOB: 1966/08/18  DOS: 07/04/2024  Provider: Emmy MARLA Blanch, NP  MRN: [id number]  Delivery: Face-to-face  Specialty: Interventional Pain Management  Type: Established Patient  Setting: Ambulatory outpatient facility  Specialty designation: 09  Referring Prov.: Dineen Rollene MATSU, FNP  Location: Outpatient office facility       History of present illness (HPI) Heather Sweeney, a 58 y.o. year old female, is here today because of her Bilateral primary osteoarthritis of knee [M17.0]. Heather Sweeney primary complain today is Knee Pain (Right knee, Radiates down to right foot )  Pertinent problems: Heather Sweeney Bilateral carpal tunnel syndrome; Morbid obesity with BMI of 40.0-44.9, adult (HCC); OSA on CPAP; Angioedema; HLD (hyperlipidemia); Chronic pain; Bilateral chronic knee pain; Bilateral primary osteoarthritis of knee; Lumbosacral radiculitis Lumbar facet arthropathy; Chronic pain syndrome, and Chronic radicular lumbar pain on their pertinent problem list.  Pain Assessment: Severity of Chronic pain is reported as a 7 /10. Location: Knee Right/Radiates down right leg to right foot. Onset: More than three month ago. Quality: Squeezing, Sharp. Timing: Constant. Modifying factor(s): Medication. Vitals:  height is 5' 3 (1.6 m) and weight is 260 lb (117.9 kg). Her temporal temperature is 97.3 F (36.3 C) (abnormal). Her blood pressure is 125/60 and her pulse is 77. Her respiration is 18 and oxygen saturation is 100%.  BMI: Estimated body mass index is 46.06 kg/m as  calculated from the following:   Height as of this encounter: 5' 3 (1.6 m).   Weight as of this encounter: 260 lb (117.9 kg).  Last encounter: 05/17/2024 Last procedure: 06/01/2024  Reason for encounter: both, medication management and post-procedure evaluation and assessment. No change in medical history since last visit.  Patient's pain is at baseline.  Patient continues multimodal pain regimen as prescribed.  States that it provides pain relief and improvement in functional status.  Currently she is taking Belbuca  300 mcg film twice daily as needed for pain.   Heather Sweeney underwent a diagnostic/therapeutic Genicular Nerves Block on June 01, 2024.  She reports 100% pain relief and functional improvement during local anesthetic phase, followed sustained 50% ongoing pain relief and functional improvement since the procedure.   The patient reports low back pain radiating to the hip and the posterior aspect of the right knee, more pronounced on the right side than the left.  She also reports swelling on her right knee.  The patient was advised to keep her right leg elevated and to walk for at least 30 minutes daily to assess improvement in her knee swelling.   Procedure:         Procedure Type: Genicular Nerves Block (Superolateral, Superomedial, and Inferomedial Genicular Nerves)  #1  Laterality: Bilateral (-50)  Level: Superior and inferior to the knee joint.  Imaging: Fluoroscopic guidance Anesthesia: Local anesthesia (1-2% Lidocaine ) Sedation: Minimal Sedation                       DOS: 06/01/2024  Performed by: Wallie Sherry, MD   Purpose: Diagnostic/Therapeutic Indications: Chronic knee pain severe enough to impact quality of life or function. Rationale (medical necessity): procedure  needed and proper for the diagnosis and/or treatment of Heather Sweeney's medical symptoms and needs. 1. Bilateral primary osteoarthritis of knee   2. Bilateral chronic knee pain   3. Chronic pain syndrome      NAS-11 Pain score:        Pre-procedure: 9 /10        Post-procedure: 9 /10   Post-Procedure Evaluation   Effectiveness:  Initial hour after procedure: 100 % . Subsequent 4-6 hours post-procedure: 100 % . Analgesia past initial 6 hours: 50 % . Ongoing improvement:  Analgesic:  Heather Sweeney underwent a diagnostic/therapeutic Genicular Nerves Block on June 01, 2024.  She reports 100% pain relief and functional improvement during local anesthetic phase, followed sustained 50% ongoing pain relief and functional improvement since the procedure.  Function: Heather Sweeney reports improvement in function ROM: Heather Sweeney reports improvement in ROM   Pharmacotherapy Assessment   Monitoring: [place] PMP: PDMP reviewed during this encounter.       Pharmacotherapy: No side-effects or adverse reactions reported. Compliance: No problems identified. Effectiveness: Clinically acceptable.  No notes on file  UDS:  Summary  Date Value Ref Range Status  05/17/2024 FINAL  Final    Comment:    ==================================================================== Compliance Drug Analysis, Ur ==================================================================== Test                             Result       Flag       Units  Drug Present and Declared for Prescription Verification   Bupropion                      PRESENT      EXPECTED   Hydroxybupropion               PRESENT      EXPECTED    Hydroxybupropion is an expected metabolite of bupropion.    Fluoxetine                      PRESENT      EXPECTED   Norfluoxetine                  PRESENT      EXPECTED    Norfluoxetine is an expected metabolite of fluoxetine .    Fluphenazine                    PRESENT      EXPECTED   Aripiprazole                   PRESENT      EXPECTED   Benztropine                     PRESENT      EXPECTED   Metoprolol                      PRESENT      EXPECTED  Drug Absent but Declared for Prescription Verification    Buprenorphine                   Not Detected UNEXPECTED ng/mg creat    Low dose buprenorphine  is not always detected even when used as    directed.    Topiramate                      Not Detected UNEXPECTED   Duloxetine   Not Detected UNEXPECTED   Hydroxyzine                     Not Detected UNEXPECTED   Lidocaine                       Not Detected UNEXPECTED    Lidocaine , as indicated in the declared medication list, is not    always detected even when used as directed.  ==================================================================== Test                      Result    Flag   Units      Ref Range   Creatinine              51               mg/dL      >=79 ==================================================================== Declared Medications:  The flagging and interpretation on this report are based on the  following declared medications.  Unexpected results may arise from  inaccuracies in the declared medications.   **Note: The testing scope of this panel includes these medications:   Aripiprazole  Benztropine   Bupropion (Wellbutrin XL)  Duloxetine  (Cymbalta )  Fluoxetine  (Prozac )  Fluphenazine   Hydroxyzine  (Atarax )  Metoprolol  (Toprol )  Topiramate  (Topamax )   **Note: The testing scope of this panel does not include small to  moderate amounts of these reported medications:   Buprenorphine   Topical Lidocaine  (Lidoderm )   **Note: The testing scope of this panel does not include the  following reported medications:   Albuterol  (Ventolin  HFA)  Amlodipine   Buspirone (Buspar)  Epinephrine  (EpiPen )  Famotidine  (Pepcid )  Fluticasone  (Flonase )  Insulin   Omalizumab  (Xolair )  Omeprazole  (Prilosec)  Ondansetron  (Zofran ) ==================================================================== For clinical consultation, please call [phone]. ====================================================================     No results found for: CBDTHCR No  results found for: D8THCCBX No results found for: D9THCCBX  ROS  Constitutional: Denies any fever or chills Gastrointestinal: No reported hemesis, hematochezia, vomiting, or acute GI distress Musculoskeletal: Chronic knee pain ((Right knee, Radiates down to right foot ) Neurological: No reported episodes of acute onset apraxia, aphasia, dysarthria, agnosia, amnesia, paralysis, loss of coordination, or loss of consciousness  Medication Review  ARIPiprazole ER, Buprenorphine  HCl, EPINEPHrine , FLUoxetine , Insulin  Pen Needle, Multiple Vitamins-Minerals, albuterol , amLODipine , benztropine , buPROPion, busPIRone, famotidine , fluPHENAZine , fluticasone , furosemide , hydrOXYzine , metoprolol  succinate, mometasone, omeprazole , ondansetron , and tirzepatide   History Review  Allergy: Heather Sweeney is allergic to hydrochlorothiazide , keflex [cephalexin], lisinopril, losartan , penicillins, bee venom, and metformin . Drug: Heather Sweeney  reports that she does not currently use drugs after having used the following drugs: Marijuana. Alcohol :  reports that she does not currently use alcohol . Tobacco:  reports that she has quit smoking. Her smoking use included e-cigarettes. Her smokeless tobacco use includes chew. Social: Heather Sweeney  reports that she has quit smoking. Her smoking use included e-cigarettes. Her smokeless tobacco use includes chew. She reports that she does not currently use alcohol . She reports that she does not currently use drugs after having used the following drugs: Marijuana. 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), Arthritis of knee, right, Arthritis of right knee (10/25/2020), Asthma, Bacterial vaginosis (10/20/2019), Bipolar disorder (HCC), BMI 37.0-37.9, adult (03/07/2022), BMI 40.0-44.9, adult (HCC)  (06/17/2022), Bronchiectasis without complication (HCC) (04/04/2022), Chronic low back pain, Chronic midline low back pain (12/17/2021), Class 3 severe obesity  due to excess calories in adult (04/09/2022), CTS (carpal tunnel syndrome), Depression, Diabetes mellitus without complication (HCC), Early satiety (02/11/2022), Elevated liver enzymes (08/17/2020), Encounter for screening colonoscopy, Encounter for screening for COVID-19 (10/10/2022), Frequent PVCs (09/24/2016), Gallstones, GERD (gastroesophageal reflux disease), History of prediabetes, Hives (02/23/2019), Hypertension, Hypokalemia (12/13/2021), Incontinence of feces (02/12/2021), Ingrown right big toenail (04/20/2020), Iron deficiency anemia (12/13/2021), Laceration of right foot, Lymphedema (08/24/2022), Obesity (BMI 30-39.9) (09/10/2021), Onychomycosis, OSA on CPAP, Osteoarthritis of knee (12/17/2021), Overactive bladder, Pain and swelling of right knee (04/20/2020), PVC's (premature ventricular contractions), PVC's (premature ventricular contractions), S/P laparoscopic cholecystectomy April 2019 (01/12/2018), Seizures (HCC), Tachycardia (10/10/2022), Urticaria (02/23/2019), and Weight loss (02/11/2022). Surgical: Heather Sweeney  has a past surgical history that includes Abdominal hysterectomy (07/08/2011); Cholecystectomy (N/A, 01/12/2018); Foot surgery; Cesarean section; Dilation and curettage of uterus; LEEP; Tubal ligation; Carpal tunnel release; Colonoscopy with propofol  (N/A, 04/15/2019); Esophagogastroduodenoscopy (egd) with propofol  (N/A, 10/23/2020); Flexible sigmoidoscopy (N/A, 02/15/2021); Panniculectomy (N/A, 04/24/2022); Right hand carpal tunnel release (Right, 10/2022); scar tissue repair (04/2023); and Esophagogastroduodenoscopy (egd) with propofol  (N/A, 11/05/2023). Family: family history includes Asthma in her son; Breast cancer (age of onset: 43) in her sister; Cancer in her paternal aunt; Cancer (age of onset: 45) in her father; Early  death (age of onset: 61) in her mother; Heart disease (age of onset: 34) in her mother; Heart failure in her mother and sister; Hypertension in her father and mother; Mental illness in her sister.  Laboratory Chemistry Profile   Renal Lab Results  Component Value Date   BUN 12 06/15/2024   CREATININE 1.23 (H) 06/15/2024   LABCREA 56 02/24/2019   BCR 6 (L) 03/04/2022   GFR 49.30 (L) 06/15/2024   GFRAA >60 01/06/2018   GFRNONAA 57 (L) 05/21/2024    Hepatic Lab Results  Component Value Date   AST 20 05/20/2024   ALT 18 05/20/2024   ALBUMIN 3.6 05/20/2024   ALKPHOS 89 05/20/2024    Electrolytes Lab Results  Component Value Date   NA 138 06/15/2024   K 3.7 06/15/2024   CL 104 06/15/2024   CALCIUM 9.4 06/15/2024    Bone Lab Results  Component Value Date   VD25OH 29.74 (L) 09/22/2023    Inflammation (CRP: Acute Phase) (ESR: Chronic Phase) Lab Results  Component Value Date   CRP <1.0 02/24/2019   ESRSEDRATE 22 02/24/2019         Note: Above Lab results reviewed.  Recent Imaging Review  CT HEAD WO CONTRAST ( ) CLINICAL DATA:  Provided history: Injury of head, initial encounter. Head trauma, minor, normal mental status. Fall two weeks ago. Persistent headache. Blurry vision.  EXAM: CT HEAD WITHOUT CONTRAST  TECHNIQUE: Contiguous axial images were obtained from the base of the skull through the vertex without intravenous contrast.  RADIATION DOSE REDUCTION: This exam was performed according to the departmental dose-optimization program which includes automated exposure control, adjustment of the mA and/or kV according to patient size and/or use of iterative reconstruction technique.  COMPARISON:  Head CT 08/25/2011.  FINDINGS: Brain:  Cerebral volume is normal.  There is no acute intracranial hemorrhage.  No demarcated cortical infarct.  No extra-axial fluid collection.  No evidence of an intracranial mass.  No midline shift.  Vascular: No  hyperdense vessel.  Skull: No calvarial fracture or aggressive osseous lesion.  Sinuses/Orbits: No mass or acute finding within the imaged orbits. No significant paranasal sinus disease at the imaged levels.  IMPRESSION: No evidence of an acute intracranial abnormality.  Electronically Signed  By: Rockey Childs D.O.   On: 06/30/2024 09:16 Note: Reviewed        Physical Exam  Vitals: BP 125/60 (BP Location: Right Arm, Patient Position: Sitting)   Pulse 77   Temp (!) 97.3 F (36.3 C) (Temporal)   Resp 18   Ht 5' 3 (1.6 m)   Wt 260 lb (117.9 kg)   SpO2 100%   BMI 46.06 kg/m  BMI: Estimated body mass index is 46.06 kg/m as calculated from the following:   Height as of this encounter: 5' 3 (1.6 m).   Weight as of this encounter: 260 lb (117.9 kg). Ideal: Ideal body weight: 52.4 kg (115 lb 8.3 oz) Adjusted ideal body weight: 78.6 kg (173 lb 5 oz) General appearance: Well nourished, well developed, and well hydrated. In no apparent acute distress Mental status: Alert, oriented x 3 (person, place, & time)       Respiratory: No evidence of acute respiratory distress Eyes: PERLA  Musculoskeletal: + Right knee pain  Lower Extremity Exam      Side: Right lower extremity   Side: Left lower extremity  Stability: No instability observed           Stability: No instability observed          Skin & Extremity Inspection: Skin color, temperature, and hair growth are WNL. No peripheral edema or cyanosis. No masses, redness, swelling, asymmetry, or associated skin lesions. No contractures.   Skin & Extremity Inspection: Skin color, temperature, and hair growth are WNL. No peripheral edema or cyanosis. No masses, redness, swelling, asymmetry, or associated skin lesions. No contractures.  Functional ROM: Pain restricted ROM for hip and knee joints           Functional ROM: Pain restricted ROM for hip and knee joints          Muscle Tone/Strength: Functionally intact. No obvious  neuro-muscular anomalies detected.   Muscle Tone/Strength: Functionally intact. No obvious neuro-muscular anomalies detected.  Sensory (Neurological): Arthropathic arthralgia         Sensory (Neurological): Arthropathic arthralgia        DTR: Patellar: deferred today Achilles: deferred today Plantar: deferred today   DTR: Patellar: deferred today Achilles: deferred today Plantar: deferred today  Palpation: No palpable anomalies   Palpation: No palpable anomalies     Assessment   Diagnosis Status  1. Bilateral primary osteoarthritis of knee   2. Bilateral chronic knee pain   3. Chronic pain syndrome   4. Medication management    Controlled Controlled Controlled   Updated Problems: Problem  Medication Management    Plan of Care  Problem-specific:  Assessment and Plan Chronic pain syndrome: Patient's pain is controlled with Belbuca , we will continue on current medication regimen.  Prescribing drug monitoring (PMP) reviewed; findings consistent with the use of prescribed medication and no evidence of narcotic misuse or abuse.  Bilateral primary osteoarthritis of knee: The patient recently received a genicular nerve block, resulting in 50% pain relief and functional improvement with continued gradual process.  I also explained to the patient the importance of continuing physical therapy and walking at least 30 minutes daily to improve physical function.  Additionally, I emphasized that weight management can help reduce stress on both knees.  Heather Sweeney has a current medication list which includes the following long-term medication(s): albuterol , amlodipine , bupropion, famotidine , fluoxetine , fluticasone , furosemide , metoprolol  succinate, and omeprazole .  Pharmacotherapy (Medications Ordered): Meds ordered this encounter  Medications   Buprenorphine  HCl (BELBUCA )  300 MCG FILM    Sig: Place 1 Film inside cheek every 12 (twelve) hours as needed.    Dispense:  60 each     Refill:  0   Orders:  No orders of the defined types were placed in this encounter.       Return in about 1 month (around 08/03/2024) for (F2F), (MM), Emmy Blanch NP.    Recent Visits Date Type Provider Dept  06/01/24 Procedure visit Marcelino Nurse, MD [facility]  05/17/24 Office Visit Marcelino Nurse, MD [facility]  Showing recent visits within past 90 days and meeting all other requirements Today's Visits Date Type Provider Dept  07/04/24 Office Visit Tiandra Swoveland K, NP [facility]  Showing today's visits and meeting all other requirements Future Appointments Date Type Provider Dept  07/28/24 Appointment Tajh Livsey K, NP [facility]  Showing future appointments within next 90 days and meeting all other requirements  I discussed the assessment and treatment plan with the patient. The patient was provided an opportunity to ask questions and all were answered. The patient agreed with the plan and demonstrated an understanding of the instructions.  Patient advised to call back or seek an in-person evaluation if the symptoms or condition worsens.  I personally spent a total of 30 minutes in the care of the patient today including preparing to see the patient, getting/reviewing separately obtained history, performing a medically appropriate exam/evaluation, counseling and educating, placing orders, referring and communicating with other health care professionals, documenting clinical information in the EHR, independently interpreting results, communicating results, and coordinating care.   Note by: Emmy MARLA Blanch, NP  Date: 07/04/2024; Time: 12:26 PM

## 2024-07-05 ENCOUNTER — Ambulatory Visit: Admitting: Student in an Organized Health Care Education/Training Program

## 2024-07-05 NOTE — Telephone Encounter (Signed)
 Pt has appt on 07/06/24 will discuss with pt then

## 2024-07-06 ENCOUNTER — Ambulatory Visit: Admitting: Family

## 2024-07-06 NOTE — Telephone Encounter (Signed)
 Spoke to pt gave her number to [facility] she states that she will call and get echo scheduled. Pt also states that she needs a referral for PT to come out to her home?

## 2024-07-08 ENCOUNTER — Other Ambulatory Visit: Payer: Self-pay | Admitting: Family

## 2024-07-08 DIAGNOSIS — I10 Essential (primary) hypertension: Secondary | ICD-10-CM

## 2024-07-11 NOTE — Telephone Encounter (Signed)
 Spoke to pt she stated that she is moving therefore she had to switch her pcp, she would like to know if you could recommend surgery or something to help with her continuous falling! She will ask pcp for home health referral

## 2024-07-11 NOTE — Telephone Encounter (Signed)
 Call pt She established care with Ethridge Matsu 06/29/24 Please kindly ask her to call her PCP for referral for home health

## 2024-07-13 NOTE — Telephone Encounter (Signed)
 Called pt she is aware she will speak with her Orthopedic

## 2024-07-15 ENCOUNTER — Telehealth: Payer: Self-pay | Admitting: Family Medicine

## 2024-07-20 ENCOUNTER — Other Ambulatory Visit: Payer: Self-pay | Admitting: Nurse Practitioner

## 2024-07-20 DIAGNOSIS — G8929 Other chronic pain: Secondary | ICD-10-CM

## 2024-07-20 DIAGNOSIS — M170 Bilateral primary osteoarthritis of knee: Secondary | ICD-10-CM

## 2024-07-20 DIAGNOSIS — Z79899 Other long term (current) drug therapy: Secondary | ICD-10-CM

## 2024-07-20 DIAGNOSIS — G894 Chronic pain syndrome: Secondary | ICD-10-CM

## 2024-07-25 ENCOUNTER — Ambulatory Visit: Admitting: Family

## 2024-07-28 ENCOUNTER — Encounter: Payer: Self-pay | Admitting: Nurse Practitioner

## 2024-07-28 ENCOUNTER — Ambulatory Visit: Attending: Nurse Practitioner | Admitting: Nurse Practitioner

## 2024-07-28 VITALS — BP 138/53 | HR 78 | Temp 97.2°F | Resp 18 | Ht 63.0 in | Wt 260.0 lb

## 2024-07-28 DIAGNOSIS — M25562 Pain in left knee: Secondary | ICD-10-CM | POA: Diagnosis present

## 2024-07-28 DIAGNOSIS — G894 Chronic pain syndrome: Secondary | ICD-10-CM | POA: Insufficient documentation

## 2024-07-28 DIAGNOSIS — M25561 Pain in right knee: Secondary | ICD-10-CM | POA: Insufficient documentation

## 2024-07-28 DIAGNOSIS — M170 Bilateral primary osteoarthritis of knee: Secondary | ICD-10-CM | POA: Insufficient documentation

## 2024-07-28 DIAGNOSIS — G8929 Other chronic pain: Secondary | ICD-10-CM | POA: Insufficient documentation

## 2024-07-28 DIAGNOSIS — Z79899 Other long term (current) drug therapy: Secondary | ICD-10-CM | POA: Diagnosis present

## 2024-07-28 MED ORDER — BELBUCA 300 MCG BU FILM
1.0000 | ORAL_FILM | Freq: Two times a day (BID) | BUCCAL | 0 refills | Status: AC | PRN
Start: 2024-08-06 — End: 2024-09-05

## 2024-07-28 NOTE — Progress Notes (Signed)
   Nursing Pain Medication Assessment:  Safety precautions to be maintained throughout the outpatient stay will include: orient to surroundings, keep bed in low position, maintain call bell within reach at all times, provide assistance with transfer out of bed and ambulation.   Medication Inspection Compliance: Pill count conducted under aseptic conditions, in front of the patient. Neither the pills nor the bottle was removed from the patient's sight at any time. Once count was completed pills were immediately returned to the patient in their original bottle.  Medication: Buprenorphine  patch ([place]) Pill/Patch Count: 41 of 60 pills/patches remain Pill/Patch Appearance: Markings consistent with prescribed medication Bottle Appearance: Standard pharmacy container. Clearly labeled. Filled Date: 75 / 01 / 2025 Last Medication intake:  Yesterday

## 2024-07-28 NOTE — Patient Instructions (Signed)
 Radiofrequency Ablation Radiofrequency ablation is a procedure that is performed to relieve pain. The procedure is often used for back, neck, or arm pain. Radiofrequency ablation involves the use of a machine that creates radio waves to make heat. During the procedure, the heat is applied to the nerve that carries the pain signal. The heat damages the nerve and interferes with the pain signal. Pain relief usually starts about 2 weeks after the procedure and lasts for 6 months to 1 year. Tell a health care provider about: Any allergies you have. All medicines you are taking, including vitamins, herbs, eye drops, creams, and over-the-counter medicines. Any problems you or family members have had with anesthetic medicines. Any bleeding problems you have. Any surgeries you have had. Any medical conditions you have. Whether you are pregnant or may be pregnant. What are the risks? Generally, this is a safe procedure. However, problems may occur, including: Pain or soreness at the injection site. Allergic reaction to medicines given during the procedure. Bleeding. Infection at the injection site. Damage to nerves or blood vessels. What happens before the procedure? When to stop eating and drinking Follow instructions from your health care provider about what you may eat and drink before your procedure. These may include: 8 hours before the procedure Stop eating most foods. Do not eat meat, fried foods, or fatty foods. Eat only light foods, such as toast or crackers. All liquids are okay except energy drinks and alcohol. 6 hours before the procedure Stop eating. Drink only clear liquids, such as water, clear fruit juice, black coffee, plain tea, and sports drinks. Do not drink energy drinks or alcohol. 2 hours before the procedure Stop drinking all liquids. You may be allowed to take medicine with small sips of water. If you do not follow your health care provider's instructions, your  procedure may be delayed or canceled. Medicines Ask your health care provider about: Changing or stopping your regular medicines. This is especially important if you are taking diabetes medicines or blood thinners. Taking medicines such as aspirin and ibuprofen. These medicines can thin your blood. Do not take these medicines unless your health care provider tells you to take them. Taking over-the-counter medicines, vitamins, herbs, and supplements. General instructions Ask your health care provider what steps will be taken to help prevent infection. These steps may include: Removing hair at the procedure site. Washing skin with a germ-killing soap. Taking antibiotic medicine. If you will be going home right after the procedure, plan to have a responsible adult: Take you home from the hospital or clinic. You will not be allowed to drive. Care for you for the time you are told. What happens during the procedure?  You will be awake during the procedure. You will need to be able to talk with the health care provider during the procedure. An IV will be inserted into one of your veins. You will be given one or more of the following: A medicine to help you relax (sedative). A medicine to numb the area (local anesthetic). Your health care provider will insert a radiofrequency needle into the area to be treated. This is done with the help of fluoroscopy. A wire that carries the radio waves (electrode) will be put through the radiofrequency needle. An electrical pulse will be sent through the electrode to verify the correct nerve that is causing your pain. You will feel a tingling sensation, and you may have muscle twitching. The tissue around the needle tip will be heated by an  electric current that comes from the radiofrequency machine. This will numb the nerves. The needle will be removed. A bandage (dressing) will be put on the insertion area. The procedure may vary among health care providers  and hospitals. What happens after the procedure? Your blood pressure, heart rate, breathing rate, and blood oxygen level will be monitored until you leave the hospital or clinic. Return to your normal activities as told by your health care provider. Ask your health care provider what activities are safe for you. If you were given a sedative during the procedure, it can affect you for several hours. Do not drive or operate machinery until your health care provider says that it is safe. Summary Radiofrequency ablation is a procedure that is performed to relieve pain. The procedure is often used for back, neck, or arm pain. Radiofrequency ablation involves the use of a machine that creates radio waves to make heat. Plan to have a responsible adult take you home from the hospital or clinic. Do not drive or operate machinery until your health care provider says that it is safe. Return to your normal activities as told by your health care provider. Ask your health care provider what activities are safe for you. This information is not intended to replace advice given to you by your health care provider. Make sure you discuss any questions you have with your health care provider. Document Revised: 03/12/2021 Document Reviewed: 03/12/2021 [employer] Patient Education  2024 [employer].

## 2024-07-28 NOTE — Progress Notes (Signed)
 PROVIDER NOTE: Interpretation of information contained herein should be left to medically-trained personnel. Specific patient instructions are provided elsewhere under Patient Instructions section of medical record. This document was created in part using AI and STT-dictation technology, any transcriptional errors that may result from this process are unintentional.  Patient: Heather Sweeney  Service: E/M   PCP: Sampson Ethridge LABOR, MD  DOB: 07/31/1966  DOS: 07/28/2024  Provider: Emmy MARLA Blanch, NP  MRN: [id number]  Delivery: Face-to-face  Specialty: Interventional Pain Management  Type: Established Patient  Setting: Ambulatory outpatient facility  Specialty designation: 09  Referring Prov.: Entzminger, Ethridge LABOR,*  Location: Outpatient office facility       History of present illness (HPI) Heather Sweeney, a 58 y.o. year old female, is here today because of her bilateral knee pain (R>L). Heather Sweeney primary complain today is Knee Pain (Right )  Pertinent problems: Heather Sweeney has carpal tunnel syndrome; Morbid obesity with BMI of 40.0-44.9, adult (HCC); OSA on CPAP; Angioedema; HLD (hyperlipidemia); Chronic pain; Bilateral chronic knee pain; Bilateral primary osteoarthritis of knee; Lumbosacral radiculitis Lumbar facet arthropathy; Chronic pain syndrome, and Chronic radicular lumbar pain on their pertinent problem list.  Pain Assessment: Severity of Chronic pain is reported as a 8 /10. Location: Knee Right, Anterior, Posterior/Denies. Onset: More than a month ago. Quality: Stabbing. Timing: Intermittent. Modifying factor(s): Medication. Vitals:  height is 5' 3 (1.6 m) and weight is 260 lb (117.9 kg). Her temperature is 97.2 F (36.2 C) (abnormal). Her blood pressure is 138/53 (abnormal) and her pulse is 78. Her respiration is 18 and oxygen saturation is 100%.  BMI: Estimated body mass index is 46.06 kg/m as calculated from the following:   Height as of this encounter: 5' 3 (1.6 m).    Weight as of this encounter: 260 lb (117.9 kg).  Last encounter: 07/04/2024. Last procedure: Visit date not found.  Reason for encounter: evaluation for possible interventional PM therapy/treatment and medication management.   Discussed the use of AI scribe software for clinical note transcription with the patient, who gave verbal consent to proceed.  History of Present Illness   Heather Sweeney is a 58 year old female with bilateral knee osteoarthritis who presents with bilateral knee pain for evaluation and consideration of genicular radiofrequency ablation.  She experiences persistent bilateral knee pain despite undergoing a genicular nerve block in August. The pain is described as burning and is more severe on the right side compared to the left. She has not had any knee surgery or knee replacement.  Of note, she underwent a genicular nerve block on June 01, 2024, which provided approximately 50% pain relief and functional improvement.  She is currently experiencing a flareup of bilateral knee pain; therefore, we discussed proceeding with genicular radiofrequency ablation (RFA) for long-term pain relief, improve function, and better overall outcomes.  She experiences muscle cramps in her back, described as 'cramps' and 'sobbing' pain, which worsen with movement. She has not taken any muscle relaxants such as tizanidine for this issue. She has not had her magnesium or vitamin D  levels checked recently, which could be contributing to her muscle cramps.  She is not currently taking any blood thinners. Her medication regimen includes Balbuca, which is managed through a bubble pack system from her pharmacy.     Pharmacotherapy Assessment   Belbuca  300 mcg film every 12 hours as needed for pain. Monitoring: [place] PMP: PDMP reviewed during this encounter.       Pharmacotherapy: No side-effects  or adverse reactions reported. Compliance: No problems identified. Effectiveness: Clinically  acceptable.  Heather Norris, Heather Sweeney  07/28/2024  9:33 AM  Sign when Signing Visit   Nursing Pain Medication Assessment:  Safety precautions to be maintained throughout the outpatient stay will include: orient to surroundings, keep bed in low position, maintain call bell within reach at all times, provide assistance with transfer out of bed and ambulation.   Medication Inspection Compliance: Pill count conducted under aseptic conditions, in front of the patient. Neither the pills nor the bottle was removed from the patient's sight at any time. Once count was completed pills were immediately returned to the patient in their original bottle.  Medication: Buprenorphine  patch (Butrans) Pill/Patch Count: 41 of 60 pills/patches remain Pill/Patch Appearance: Markings consistent with prescribed medication Bottle Appearance: Standard pharmacy container. Clearly labeled. Filled Date: 70 / 01 / 2025 Last Medication intake:  Yesterday    UDS:  Summary  Date Value Ref Range Status  05/17/2024 FINAL  Final    Comment:    ==================================================================== Compliance Drug Analysis, Ur ==================================================================== Test                             Result       Flag       Units  Drug Present and Declared for Prescription Verification   Bupropion                      PRESENT      EXPECTED   Hydroxybupropion               PRESENT      EXPECTED    Hydroxybupropion is an expected metabolite of bupropion.    Fluoxetine                      PRESENT      EXPECTED   Norfluoxetine                  PRESENT      EXPECTED    Norfluoxetine is an expected metabolite of fluoxetine .    Fluphenazine                    PRESENT      EXPECTED   Aripiprazole                   PRESENT      EXPECTED   Benztropine                     PRESENT      EXPECTED   Metoprolol                      PRESENT      EXPECTED  Drug Absent but Declared for Prescription  Verification   Buprenorphine                   Not Detected UNEXPECTED ng/mg creat    Low dose buprenorphine  is not always detected even when used as    directed.    Topiramate                      Not Detected UNEXPECTED   Duloxetine                      Not Detected UNEXPECTED   Hydroxyzine   Not Detected UNEXPECTED   Lidocaine                       Not Detected UNEXPECTED    Lidocaine , as indicated in the declared medication list, is not    always detected even when used as directed.  ==================================================================== Test                      Result    Flag   Units      Ref Range   Creatinine              51               mg/dL      >=79 ==================================================================== Declared Medications:  The flagging and interpretation on this report are based on the  following declared medications.  Unexpected results may arise from  inaccuracies in the declared medications.   **Note: The testing scope of this panel includes these medications:   Aripiprazole  Benztropine   Bupropion (Wellbutrin XL)  Duloxetine  (Cymbalta )  Fluoxetine  (Prozac )  Fluphenazine   Hydroxyzine  (Atarax )  Metoprolol  (Toprol )  Topiramate  (Topamax )   **Note: The testing scope of this panel does not include small to  moderate amounts of these reported medications:   Buprenorphine   Topical Lidocaine  (Lidoderm )   **Note: The testing scope of this panel does not include the  following reported medications:   Albuterol  (Ventolin  HFA)  Amlodipine   Buspirone (Buspar)  Epinephrine  (EpiPen )  Famotidine  (Pepcid )  Fluticasone  (Flonase )  Insulin   Omalizumab  (Xolair )  Omeprazole  (Prilosec)  Ondansetron  (Zofran ) ==================================================================== For clinical consultation, please call [phone]. ====================================================================     No results found for:  CBDTHCR No results found for: D8THCCBX No results found for: D9THCCBX  ROS  Constitutional: Denies any fever or chills Gastrointestinal: No reported hemesis, hematochezia, vomiting, or acute GI distress Musculoskeletal: Bilateral knee pain Neurological: No reported episodes of acute onset apraxia, aphasia, dysarthria, agnosia, amnesia, paralysis, loss of coordination, or loss of consciousness  Medication Review  ARIPiprazole ER, Buprenorphine  HCl, EPINEPHrine , FLUoxetine , Insulin  Pen Needle, Multiple Vitamins-Minerals, albuterol , amLODipine , benztropine , buPROPion, busPIRone, famotidine , fluPHENAZine , fluticasone , furosemide , hydrOXYzine , metoprolol  succinate, mometasone, omeprazole , ondansetron , and tirzepatide   History Review  Allergy: Ms. Woolston is allergic to hydrochlorothiazide , keflex [cephalexin], lisinopril, losartan , penicillins, bee venom, and metformin . Drug: Ms. Glendenning  reports that she does not currently use drugs after having used the following drugs: Marijuana. Alcohol :  reports that she does not currently use alcohol . Tobacco:  reports that she has quit smoking. Her smoking use included e-cigarettes. Her smokeless tobacco use includes chew. Social: Ms. Reznick  reports that she has quit smoking. Her smoking use included e-cigarettes. Her smokeless tobacco use includes chew. She reports that she does not currently use alcohol . She reports that she does not currently use drugs after having used the following drugs: Marijuana. 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), Arthritis of knee, right, Arthritis of right knee (10/25/2020), Asthma, Bacterial vaginosis (10/20/2019), Bipolar disorder (HCC), BMI 37.0-37.9, adult (03/07/2022), BMI 40.0-44.9, adult (HCC) (06/17/2022), Bronchiectasis without  complication (HCC) (04/04/2022), Chronic low back pain, Chronic midline low back pain (12/17/2021), Class 3 severe obesity due to excess calories in adult [facility]) (04/09/2022), CTS (carpal tunnel syndrome), Depression, Diabetes mellitus without complication (HCC), Early satiety (02/11/2022), Elevated liver enzymes (08/17/2020), Encounter for screening colonoscopy, Encounter for screening for  COVID-19 (10/10/2022), Frequent PVCs (09/24/2016), Gallstones, GERD (gastroesophageal reflux disease), History of prediabetes, Hives (02/23/2019), Hypertension, Hypokalemia (12/13/2021), Incontinence of feces (02/12/2021), Ingrown right big toenail (04/20/2020), Iron deficiency anemia (12/13/2021), Laceration of right foot, Lymphedema (08/24/2022), Obesity (BMI 30-39.9) (09/10/2021), Onychomycosis, OSA on CPAP, Osteoarthritis of knee (12/17/2021), Overactive bladder, Pain and swelling of right knee (04/20/2020), PVC's (premature ventricular contractions), PVC's (premature ventricular contractions), S/P laparoscopic cholecystectomy April 2019 (01/12/2018), Seizures (HCC), Tachycardia (10/10/2022), Urticaria (02/23/2019), and Weight loss (02/11/2022). Surgical: Ms. Quizhpi  has a past surgical history that includes Abdominal hysterectomy (07/08/2011); Cholecystectomy (N/A, 01/12/2018); Foot surgery; Cesarean section; Dilation and curettage of uterus; LEEP; Tubal ligation; Carpal tunnel release; Colonoscopy with propofol  (N/A, 04/15/2019); Esophagogastroduodenoscopy (egd) with propofol  (N/A, 10/23/2020); Flexible sigmoidoscopy (N/A, 02/15/2021); Panniculectomy (N/A, 04/24/2022); Right hand carpal tunnel release (Right, 10/2022); scar tissue repair (04/2023); and Esophagogastroduodenoscopy (egd) with propofol  (N/A, 11/05/2023). Family: family history includes Asthma in her son; Breast cancer (age of onset: 64) in her sister; Cancer in her paternal aunt; Cancer (age of onset: 27) in her father; Early death (age of onset: 59) in her  mother; Heart disease (age of onset: 61) in her mother; Heart failure in her mother and sister; Hypertension in her father and mother; Mental illness in her sister.  Laboratory Chemistry Profile   Renal Lab Results  Component Value Date   BUN 12 06/15/2024   CREATININE 1.23 (H) 06/15/2024   LABCREA 56 02/24/2019   BCR 6 (L) 03/04/2022   GFR 49.30 (L) 06/15/2024   GFRAA >60 01/06/2018   GFRNONAA 57 (L) 05/21/2024    Hepatic Lab Results  Component Value Date   AST 20 05/20/2024   ALT 18 05/20/2024   ALBUMIN 3.6 05/20/2024   ALKPHOS 89 05/20/2024    Electrolytes Lab Results  Component Value Date   NA 138 06/15/2024   K 3.7 06/15/2024   CL 104 06/15/2024   CALCIUM 9.4 06/15/2024    Bone Lab Results  Component Value Date   VD25OH 29.74 (L) 09/22/2023    Inflammation (CRP: Acute Phase) (ESR: Chronic Phase) Lab Results  Component Value Date   CRP <1.0 02/24/2019   ESRSEDRATE 22 02/24/2019         Note: Above Lab results reviewed.  Recent Imaging Review  CT HEAD WO CONTRAST ( ) CLINICAL DATA:  Provided history: Injury of head, initial encounter. Head trauma, minor, normal mental status. Fall two weeks ago. Persistent headache. Blurry vision.  EXAM: CT HEAD WITHOUT CONTRAST  TECHNIQUE: Contiguous axial images were obtained from the base of the skull through the vertex without intravenous contrast.  RADIATION DOSE REDUCTION: This exam was performed according to the departmental dose-optimization program which includes automated exposure control, adjustment of the mA and/or kV according to patient size and/or use of iterative reconstruction technique.  COMPARISON:  Head CT 08/25/2011.  FINDINGS: Brain:  Cerebral volume is normal.  There is no acute intracranial hemorrhage.  No demarcated cortical infarct.  No extra-axial fluid collection.  No evidence of an intracranial mass.  No midline shift.  Vascular: No hyperdense vessel.  Skull: No  calvarial fracture or aggressive osseous lesion.  Sinuses/Orbits: No mass or acute finding within the imaged orbits. No significant paranasal sinus disease at the imaged levels.  IMPRESSION: No evidence of an acute intracranial abnormality.  Electronically Signed   By: Rockey Childs D.O.   On: 06/30/2024 09:16 Note: Reviewed        Physical Exam  Vitals: BP (!) 138/53 (Patient Position: Sitting, Cuff Size:  Normal)   Pulse 78   Temp (!) 97.2 F (36.2 C)   Resp 18   Ht 5' 3 (1.6 m)   Wt 260 lb (117.9 kg)   SpO2 100%   BMI 46.06 kg/m  BMI: Estimated body mass index is 46.06 kg/m as calculated from the following:   Height as of this encounter: 5' 3 (1.6 m).   Weight as of this encounter: 260 lb (117.9 kg). Ideal: Ideal body weight: 52.4 kg (115 lb 8.3 oz) Adjusted ideal body weight: 78.6 kg (173 lb 5 oz) General appearance: Well nourished, well developed, and well hydrated. In no apparent acute distress Mental status: Alert, oriented x 3 (person, place, & time)       Respiratory: No evidence of acute respiratory distress Eyes: PERLA  Musculoskeletal: + Bilateral knee pain worse with weightbearing, standing, and walking Assessment   Diagnosis Status  1. Bilateral primary osteoarthritis of knee   2. Bilateral chronic knee pain   3. Chronic pain syndrome   4. Medication management    Having a Flare-up Having a Flare-up Controlled   Updated Problems: No problems updated.  Plan of Care  Problem-specific:  Assessment and Plan    Bilateral primary osteoarthritis of knee with chronic pain syndrome Chronic bilateral knee pain due to primary osteoarthritis. Previous genicular nerve block provided limited relief. Right knee pain more severe. Radiofrequency ablation discussed as next step if nerve block ineffective. Explained radiofrequency ablation involves nerve burning or ablation of nerve, which transmits pain signals from the knee joint to the brain.  The procedure can  effectively disrupt pain signals, allowing patient to decrease knee discomfort and potentially avoid surgical interventions. - Schedule bilateral genicular radiofrequency ablation with Doctor Latif in three weeks. - Prescribe Balbuca and send to [facility] pharmacy. - Ensure she has a driver for the procedure.  Chronic pain syndrome: Patient's pain is well-controlled with Belbuca  300 mcg film, will continue on current medication regimen.  Prescribing drug monitoring (PMP) reviewed, findings consistent with the use of prescribed medication and no evidence of narcotic misuse or abuse.  Urine drug screening (UDS) up to date.  Schedule follow-up in 1 month for medication management with Wava Kildow, NP.  Muscle cramps Reports muscle cramps, possibly due to low magnesium or vitamin D  levels. No current use of muscle relaxants. Advised to check magnesium and vitamin D  levels. Discussed low levels can cause cramps, supplementation may help. - Advise checking magnesium and vitamin D  levels with primary care provider. - Consider over-the-counter vitamin D  supplementation. - Discuss muscle relaxants if cramps persist after addressing potential deficiencies.   Plan: (Clinic): (B) Genicular RFA # 1 with Dr. Marcelino       Ms. Heather Sweeney has a current medication list which includes the following long-term medication(s): albuterol , amlodipine , bupropion, famotidine , fluoxetine , fluticasone , furosemide , metoprolol  succinate, and omeprazole .  Pharmacotherapy (Medications Ordered): Meds ordered this encounter  Medications   Buprenorphine  HCl (BELBUCA ) 300 MCG FILM    Sig: Place 1 Film inside cheek every 12 (twelve) hours as needed.    Dispense:  60 each    Refill:  0   Orders:  Orders Placed This Encounter  Procedures   Radiofrequency,Genicular    Standing Status:   Future    Expiration Date:   10/28/2024    Scheduling Instructions:     Procedure: Genicular nerve(s) Radiofrequency Ablation (RFA)      Side(s): Bilateral Knee     Level(s): Superior-Lateral, Superior-Medial, and Inferior-Medial Genicular Nerve(s)  Sedation: Patient's choice.     Scheduling Timeframe: As soon as pre-approved    Where will this procedure be performed?:   [facility]        Return in about 3 weeks (around 08/18/2024) for (Clinic): (B) Genicular RFA # 1 with Dr. Marcelino .    Recent Visits Date Type Provider Dept  07/04/24 Office Visit Lajoya Dombek K, NP [facility]  06/01/24 Procedure visit Marcelino Nurse, MD [facility]  05/17/24 Office Visit Marcelino Nurse, MD [facility]  Showing recent visits within past 90 days and meeting all other requirements Today's Visits Date Type Provider Dept  07/28/24 Office Visit Emalea Mix K, NP [facility]  Showing today's visits and meeting all other requirements Future Appointments Date Type Provider Dept  08/16/24 Appointment Jodye Scali K, NP [facility]  08/22/24 Appointment Marcelino Nurse, MD [facility]  Showing future appointments within next 90 days and meeting all other requirements  I discussed the assessment and treatment plan with the patient. The patient was provided an opportunity to ask questions and all were answered. The patient agreed with the plan and demonstrated an understanding of the instructions.  Patient advised to call back or seek an in-person evaluation if the symptoms or condition worsens. I personally spent a total of 30 minutes in the care of the patient today including preparing to see the patient, getting/reviewing separately obtained history, performing a medically appropriate exam/evaluation, counseling and educating, placing orders, referring and communicating with other health care professionals, documenting clinical information in the EHR, independently interpreting results, communicating results, and coordinating care.  Note by: Ashtan Girtman K Bettylee Feig, NP (TTS and AI  technology used. I apologize for any typographical errors that were not detected and corrected.) Date: 07/28/2024; Time: 12:24 PM

## 2024-08-03 ENCOUNTER — Ambulatory Visit
Admission: RE | Admit: 2024-08-03 | Discharge: 2024-08-03 | Disposition: A | Discharge status: Home / Self Care | Source: Ambulatory Visit | Attending: Family | Admitting: Family

## 2024-08-03 ENCOUNTER — Ambulatory Visit: Payer: Self-pay | Admitting: Family

## 2024-08-03 DIAGNOSIS — I129 Hypertensive chronic kidney disease with stage 1 through stage 4 chronic kidney disease, or unspecified chronic kidney disease: Secondary | ICD-10-CM | POA: Insufficient documentation

## 2024-08-03 DIAGNOSIS — I351 Nonrheumatic aortic (valve) insufficiency: Secondary | ICD-10-CM | POA: Diagnosis not present

## 2024-08-03 DIAGNOSIS — G4730 Sleep apnea, unspecified: Secondary | ICD-10-CM | POA: Diagnosis not present

## 2024-08-03 DIAGNOSIS — R600 Localized edema: Secondary | ICD-10-CM | POA: Insufficient documentation

## 2024-08-03 DIAGNOSIS — N183 Chronic kidney disease, stage 3 unspecified: Secondary | ICD-10-CM | POA: Insufficient documentation

## 2024-08-03 DIAGNOSIS — F17200 Nicotine dependence, unspecified, uncomplicated: Secondary | ICD-10-CM | POA: Insufficient documentation

## 2024-08-03 LAB — ECHOCARDIOGRAM COMPLETE
AR max vel: 2.47 cm2
AV Peak grad: 9.5 mmHg
Ao pk vel: 1.55 m/s
Area-P 1/2: 3.65 cm2
P 1/2 time: 493 ms
S' Lateral: 3.3 cm

## 2024-08-03 NOTE — Progress Notes (Signed)
 Echocardiogram 2D Echocardiogram has been performed.  Heather Sweeney 08/03/2024, 8:56 AM

## 2024-08-04 ENCOUNTER — Telehealth: Payer: Self-pay

## 2024-08-04 NOTE — Telephone Encounter (Signed)
 Copied from CRM [id number]. Topic: Clinical - Medical Advice >> Aug 04, 2024  3:19 PM Alfonso ORN wrote: Reason for CRM: pt called stating she has surgery next week and needed to see pcp to go over mri . Relayed message from [place] . Please contact pt with more clarification

## 2024-08-08 ENCOUNTER — Telehealth: Payer: Self-pay

## 2024-08-08 NOTE — Telephone Encounter (Signed)
 Copied from CRM [id number]. Topic: Clinical - Order For Equipment >> Aug 08, 2024 10:35 AM Heather Sweeney wrote: Reason for CRM: Patient called in to send a message requesting DME Equipment.  The patient called in to request the following DME:  Diabetic Stockings Diabetic Shoes: Size (9.5 women's)

## 2024-08-08 NOTE — Telephone Encounter (Signed)
 Spoke to pt answered questions regarding Echo. Pt verbalized understanding

## 2024-08-10 ENCOUNTER — Ambulatory Visit: Payer: Self-pay

## 2024-08-10 NOTE — Telephone Encounter (Signed)
 LVM to call back to inform pt that she needs to speak to new pcp about orders

## 2024-08-10 NOTE — Telephone Encounter (Signed)
 FYI Only or Action Required?: Action required by provider: request for appointment.  Patient was last seen in primary care on 06/23/2024 by Dineen Rollene MATSU, FNP.  Called Nurse Triage reporting Knee Pain.  Symptoms began today.  Interventions attempted: Rest, hydration, or home remedies.  Symptoms are: unchanged.  Triage Disposition: See PCP When Office is Open (Within 3 Days)  Patient/caregiver understands and will follow disposition?: Yes  Copied from CRM #[id number]. Topic: Clinical - Red Word Triage >> Aug 10, 2024  4:53 PM Alfonso ORN wrote: Red Word that prompted transfer to Nurse Triage: knee pain, recent fall Reason for Disposition  [1] MODERATE pain (e.g., interferes with normal activities, limping) AND [2] present > 3 days  Answer Assessment - Initial Assessment Questions 1. LOCATION and RADIATION: Where is the pain located?      Right knee 2. QUALITY: What does the pain feel like?  (e.g., sharp, dull, aching, burning)     burning 3. SEVERITY: How bad is the pain? What does it keep you from doing?   (Scale 1-10; or mild, moderate, severe)     6 out of 10 4. ONSET: When did the pain start? Does it come and go, or is it there all the time?     Fell two hours ago 5. RECURRENT: Have you had this pain before? If Yes, ask: When, and what happened then?     yes 6. SETTING: Has there been any recent work, exercise or other activity that involved that part of the body?      no 7. AGGRAVATING FACTORS: What makes the knee pain worse? (e.g., walking, climbing stairs, running)     walking 8. ASSOCIATED SYMPTOMS: Is there any swelling or redness of the knee?     yes 9. OTHER SYMPTOMS: Do you have any other symptoms? (e.g., calf pain, chest pain, difficulty breathing, fever)     no  Protocols used: Knee Pain-A-AH

## 2024-08-11 ENCOUNTER — Ambulatory Visit: Payer: Self-pay

## 2024-08-11 NOTE — Telephone Encounter (Signed)
 FYI Only or Action Required?: FYI only for provider: fyi only.  Patient was last seen in primary care on 06/23/2024 by Dineen Rollene MATSU, FNP.  Called Nurse Triage reporting Knee Injury.  Symptoms began unknown.  Interventions attempted: Other: unknown.  Symptoms are: unknown.  Triage Disposition: See PCP When Office is Open (Within 3 Days)  Patient/caregiver understands and will follow disposition?: UnsureCopied from CRM #[id number]. Topic: Clinical - Red Word Triage >> Aug 11, 2024  3:10 PM Heather Sweeney wrote: [name] that prompted transfer to Nurse Triage: Patient states she spoke to NT yesterday, but requests to NT again, she fell and now her knee is really swollen and very painful and hurts really bad when she walks. Reason for Disposition  [1] Limp when walking AND [2] due to a direct blow  Answer Assessment - Initial Assessment Questions Difficult to hear patient as she has RN on speaker phone in a car full of children. She states that she is in increased pain in right knee from falling. RN wasn't able to get many answers out of her. Unsure if she fell again today, or yesterday. RN did try to schedule an appt with a provider for tomorrow, however she wasn't sure the time was going to work due to someone having to pick up kids. She asked what she should do. She continued to ask if ya'll can do surgery. RN advised that it will just be an assessment to see if there is anything we can do and no guarantee there will be adjustments as she already sees pain for med management and injections. RN advised pt to to call pain management to see if they can see her any sooner. RN did also offer UC.   Upon chart review, it appears pt sees a new PCP not through [facility].  Protocols used: Knee Injury-A-AH

## 2024-08-12 NOTE — Telephone Encounter (Signed)
 Last read by Eleanor LOISE Gavel at 8:48AM on 08/12/2024.  FYI

## 2024-08-16 ENCOUNTER — Ambulatory Visit: Attending: Nurse Practitioner | Admitting: Nurse Practitioner

## 2024-08-16 ENCOUNTER — Encounter: Payer: Self-pay | Admitting: Nurse Practitioner

## 2024-08-16 DIAGNOSIS — G8929 Other chronic pain: Secondary | ICD-10-CM | POA: Diagnosis present

## 2024-08-16 DIAGNOSIS — G894 Chronic pain syndrome: Secondary | ICD-10-CM | POA: Diagnosis not present

## 2024-08-16 DIAGNOSIS — M170 Bilateral primary osteoarthritis of knee: Secondary | ICD-10-CM | POA: Insufficient documentation

## 2024-08-16 DIAGNOSIS — M25561 Pain in right knee: Secondary | ICD-10-CM | POA: Insufficient documentation

## 2024-08-16 DIAGNOSIS — M25562 Pain in left knee: Secondary | ICD-10-CM | POA: Diagnosis present

## 2024-08-16 DIAGNOSIS — Z79899 Other long term (current) drug therapy: Secondary | ICD-10-CM | POA: Insufficient documentation

## 2024-08-16 MED ORDER — BELBUCA 300 MCG BU FILM: 1.0000 | ORAL_FILM | Freq: Two times a day (BID) | BUCCAL | 0 refills | Status: AC | PRN

## 2024-08-16 NOTE — Progress Notes (Signed)
 Nursing Pain Medication Assessment:  Safety precautions to be maintained throughout the outpatient stay will include: orient to surroundings, keep bed in low position, maintain call bell within reach at all times, provide assistance with transfer out of bed and ambulation.  Medication Inspection Compliance: Pill count conducted under aseptic conditions, in front of the patient. Neither the pills nor the bottle was removed from the patient's sight at any time. Once count was completed pills were immediately returned to the patient in their original bottle.  Medication: Belbuca  Pill/Patch Count: 55 of 60 pills/patches remain Pill/Patch Appearance: Markings consistent with prescribed medication Bottle Appearance: Standard pharmacy container. Clearly labeled. Filled Date: 2 / 30 / 2025 Last Medication intake:  Yesterday

## 2024-08-16 NOTE — Progress Notes (Signed)
 PROVIDER NOTE: Interpretation of information contained herein should be left to medically-trained personnel. Specific patient instructions are provided elsewhere under Patient Instructions section of medical record. This document was created in part using AI and STT-dictation technology, any transcriptional errors that may result from this process are unintentional.  Patient: Heather Sweeney  Service: E/M   PCP: Sampson Ethridge LABOR, MD  DOB: 02-23-1966  DOS: 08/16/2024  Provider: Emmy MARLA Blanch, NP  MRN: [id number]  Delivery: Face-to-face  Specialty: Interventional Pain Management  Type: Established Patient  Setting: Ambulatory outpatient facility  Specialty designation: 09  Referring Prov.: Entzminger, Ethridge LABOR,*  Location: Outpatient office facility       History of present illness (HPI) Heather Sweeney, a 58 y.o. year old female, is here today because of her bilateral knee pain. Heather Sweeney primary complain today is Knee Pain  Pertinent problems: Ms. Seabrooks has carpal tunnel syndrome; Morbid obesity with BMI of 40.0-44.9, adult (HCC); OSA on CPAP; Angioedema; HLD (hyperlipidemia); Chronic pain; Bilateral chronic knee pain; Bilateral primary osteoarthritis of knee; Lumbosacral radiculitis Lumbar facet arthropathy; Chronic pain syndrome, and Chronic radicular lumbar pain on their pertinent problem list.   Pain Assessment: Severity of Chronic pain is reported as a 8 /10. Location: Knee Right/Down right leg to toes. Onset: More than a month ago. Quality: Cramping. Timing: Intermittent. Modifying factor(s): Denies. Vitals:  height is 5' 3 (1.6 m) and weight is 260 lb (117.9 kg). Her temporal temperature is 97.2 F (36.2 C) (abnormal). Her blood pressure is 130/101 (abnormal) and her pulse is 79. Her respiration is 18 and oxygen saturation is 98%.  BMI: Estimated body mass index is 46.06 kg/m as calculated from the following:   Height as of this encounter: 5' 3 (1.6 m).   Weight as of  this encounter: 260 lb (117.9 kg).  Last encounter: 07/28/2024. Last procedure: 06/01/2024  Reason for encounter: medication management. No change in medical history since last visit.  Patient's pain is at baseline.  Patient continues multimodal pain regimen as prescribed.  States that it provides pain relief and improvement in functional status.   Discussed the use of AI scribe software for clinical note transcription with the patient, who gave verbal consent to proceed.  History of Present Illness   Heather Sweeney is a 58 year old female with knee arthritis who presents with knee pain and instability.  She experiences pain in her knee, specifically on both the lateral and medial aspects. The pain is exacerbated by walking, during which her knee may 'pop', leading to episodes of dropping and falling. When accompanied by someone, she needs to hold her to prevent falls.  X-rays of both knees have revealed significant arthritis. In August, she received a genicular nerve block for her right knee, resulting in approximately 50% improvement in symptoms. She is currently on Balbuca for pain management but reports it is ineffective. No side effects from Balbuca.     Pharmacotherapy Assessment   Belbuca  300 mcg film every 12 hours as needed for pain. Monitoring: [place] PMP: PDMP reviewed during this encounter.       Pharmacotherapy: No side-effects or adverse reactions reported. Compliance: No problems identified. Effectiveness: Clinically acceptable.  Heather Sweeney, [place]  08/16/2024  9:03 AM  Sign when Signing Visit Nursing Pain Medication Assessment:  Safety precautions to be maintained throughout the outpatient stay will include: orient to surroundings, keep bed in low position, maintain call bell within reach at all times, provide assistance with transfer  out of bed and ambulation.  Medication Inspection Compliance: Pill count conducted under aseptic conditions, in front of the patient. Neither  the pills nor the bottle was removed from the patient's sight at any time. Once count was completed pills were immediately returned to the patient in their original bottle.  Medication: Belbuca  Pill/Patch Count: 55 of 60 pills/patches remain Pill/Patch Appearance: Markings consistent with prescribed medication Bottle Appearance: Standard pharmacy container. Clearly labeled. Filled Date: 78 / 38 / 2025 Last Medication intake:  Yesterday    UDS:  Summary  Date Value Ref Range Status  05/17/2024 FINAL  Final    Comment:    ==================================================================== Compliance Drug Analysis, Ur ==================================================================== Test                             Result       Flag       Units  Drug Present and Declared for Prescription Verification   Bupropion                      PRESENT      EXPECTED   Hydroxybupropion               PRESENT      EXPECTED    Hydroxybupropion is an expected metabolite of bupropion.    Fluoxetine                      PRESENT      EXPECTED   Norfluoxetine                  PRESENT      EXPECTED    Norfluoxetine is an expected metabolite of fluoxetine .    Fluphenazine                    PRESENT      EXPECTED   Aripiprazole                   PRESENT      EXPECTED   Benztropine                     PRESENT      EXPECTED   Metoprolol                      PRESENT      EXPECTED  Drug Absent but Declared for Prescription Verification   Buprenorphine                   Not Detected UNEXPECTED ng/mg creat    Low dose buprenorphine  is not always detected even when used as    directed.    Topiramate                      Not Detected UNEXPECTED   Duloxetine                      Not Detected UNEXPECTED   Hydroxyzine                     Not Detected UNEXPECTED   Lidocaine                       Not Detected UNEXPECTED    Lidocaine , as indicated in the declared medication list, is not    always detected even when  used as directed.  ====================================================================  Test                      Result    Flag   Units      Ref Range   Creatinine              51               mg/dL      >=79 ==================================================================== Declared Medications:  The flagging and interpretation on this report are based on the  following declared medications.  Unexpected results may arise from  inaccuracies in the declared medications.   **Note: The testing scope of this panel includes these medications:   Aripiprazole  Benztropine   Bupropion (Wellbutrin XL)  Duloxetine  (Cymbalta )  Fluoxetine  (Prozac )  Fluphenazine   Hydroxyzine  (Atarax )  Metoprolol  (Toprol )  Topiramate  (Topamax )   **Note: The testing scope of this panel does not include small to  moderate amounts of these reported medications:   Buprenorphine   Topical Lidocaine  (Lidoderm )   **Note: The testing scope of this panel does not include the  following reported medications:   Albuterol  (Ventolin  HFA)  Amlodipine   Buspirone (Buspar)  Epinephrine  (EpiPen )  Famotidine  (Pepcid )  Fluticasone  (Flonase )  Insulin   Omalizumab  (Xolair )  Omeprazole  (Prilosec)  Ondansetron  (Zofran ) ==================================================================== For clinical consultation, please call [phone]. ====================================================================     No results found for: CBDTHCR No results found for: D8THCCBX No results found for: D9THCCBX  ROS  Constitutional: Denies any fever or chills Gastrointestinal: No reported hemesis, hematochezia, vomiting, or acute GI distress Musculoskeletal: bilateral knee pain Neurological: No reported episodes of acute onset apraxia, aphasia, dysarthria, agnosia, amnesia, paralysis, loss of coordination, or loss of consciousness  Medication Review  ARIPiprazole ER, Buprenorphine  HCl, EPINEPHrine , FLUoxetine ,  Insulin  Pen Needle, Multiple Vitamins-Minerals, acetaminophen , albuterol , amLODipine , benztropine , buPROPion, busPIRone, diclofenac , famotidine , fluPHENAZine , fluticasone , furosemide , hydrOXYzine , metoprolol  succinate, mometasone, naloxone, nicotine , nicotine  polacrilex, omeprazole , ondansetron , tirzepatide , and topiramate   History Review  Allergy: Heather Sweeney is allergic to hydrochlorothiazide , keflex [cephalexin], lisinopril, losartan , penicillins, bee venom, and metformin . Drug: Heather Sweeney  reports that she does not currently use drugs after having used the following drugs: Marijuana. Alcohol :  reports that she does not currently use alcohol . Tobacco:  reports that she has quit smoking. Her smoking use included e-cigarettes. Her smokeless tobacco use includes chew. Social: Heather Sweeney  reports that she has quit smoking. Her smoking use included e-cigarettes. Her smokeless tobacco use includes chew. She reports that she does not currently use alcohol . She reports that she does not currently use drugs after having used the following drugs: Marijuana. 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), Arthritis of knee, right, Arthritis of right knee (10/25/2020), Asthma, Bacterial vaginosis (10/20/2019), Bipolar disorder (HCC), BMI 37.0-37.9, adult (03/07/2022), BMI 40.0-44.9, adult (HCC) (06/17/2022), Bronchiectasis without complication (HCC) (04/04/2022), Chronic low back pain, Chronic midline low back pain (12/17/2021), Class 3 severe obesity due to excess calories in adult [facility]) (04/09/2022), CTS (carpal tunnel syndrome), Depression, Diabetes mellitus without complication (HCC), Early satiety (02/11/2022), Elevated liver enzymes (08/17/2020), Encounter for screening colonoscopy, Encounter for screening for COVID-19  (10/10/2022), Frequent PVCs (09/24/2016), Gallstones, GERD (gastroesophageal reflux disease), History of prediabetes, Hives (02/23/2019), Hypertension, Hypokalemia (12/13/2021), Incontinence of feces (02/12/2021), Ingrown right big toenail (04/20/2020), Iron deficiency anemia (12/13/2021), Laceration of right foot, Lymphedema (08/24/2022), Obesity (BMI 30-39.9) (09/10/2021), Onychomycosis, OSA on CPAP, Osteoarthritis of knee (  12/17/2021), Overactive bladder, Pain and swelling of right knee (04/20/2020), PVC's (premature ventricular contractions), PVC's (premature ventricular contractions), S/P laparoscopic cholecystectomy April 2019 (01/12/2018), Seizures (HCC), Tachycardia (10/10/2022), Urticaria (02/23/2019), and Weight loss (02/11/2022). Surgical: Heather Sweeney  has a past surgical history that includes Abdominal hysterectomy (07/08/2011); Cholecystectomy (N/A, 01/12/2018); Foot surgery; Cesarean section; Dilation and curettage of uterus; LEEP; Tubal ligation; Carpal tunnel release; Colonoscopy with propofol  (N/A, 04/15/2019); Esophagogastroduodenoscopy (egd) with propofol  (N/A, 10/23/2020); Flexible sigmoidoscopy (N/A, 02/15/2021); Panniculectomy (N/A, 04/24/2022); Right hand carpal tunnel release (Right, 10/2022); scar tissue repair (04/2023); and Esophagogastroduodenoscopy (egd) with propofol  (N/A, 11/05/2023). Family: family history includes Asthma in her son; Breast cancer (age of onset: 55) in her sister; Cancer in her paternal aunt; Cancer (age of onset: 3) in her father; Early death (age of onset: 53) in her mother; Heart disease (age of onset: 61) in her mother; Heart failure in her mother and sister; Hypertension in her father and mother; Mental illness in her sister.  Laboratory Chemistry Profile   Renal Lab Results  Component Value Date   BUN 12 06/15/2024   CREATININE 1.23 (H) 06/15/2024   LABCREA 56 02/24/2019   BCR 6 (L) 03/04/2022   GFR 49.30 (L) 06/15/2024   GFRAA >60 01/06/2018    GFRNONAA 57 (L) 05/21/2024    Hepatic Lab Results  Component Value Date   AST 20 05/20/2024   ALT 18 05/20/2024   ALBUMIN 3.6 05/20/2024   ALKPHOS 89 05/20/2024    Electrolytes Lab Results  Component Value Date   NA 138 06/15/2024   K 3.7 06/15/2024   CL 104 06/15/2024   CALCIUM 9.4 06/15/2024    Bone Lab Results  Component Value Date   VD25OH 29.74 (L) 09/22/2023    Inflammation (CRP: Acute Phase) (ESR: Chronic Phase) Lab Results  Component Value Date   CRP <1.0 02/24/2019   ESRSEDRATE 22 02/24/2019         Note: Above Lab results reviewed.  Recent Imaging Review  ECHOCARDIOGRAM COMPLETE    ECHOCARDIOGRAM REPORT       Patient Name:   Heather Sweeney Date of Exam: 08/03/2024 Medical Rec #:  [id number]        Height:       63.0 in Accession #:    [id number]       Weight:       260.0 lb Date of Birth:  15-Apr-1966         BSA:          2.162 m Patient Age:    58 years         BP:           138/53 mmHg Patient Gender: F                HR:           78 bpm. Exam Location:  [facility]  Procedure: 2D Echo, Cardiac Doppler, Color Doppler and Strain Analysis (Both            Spectral and Color Flow Doppler were utilized during procedure).  Indications:     Peripheral edema   History:         Patient has prior history of Echocardiogram examinations, most                  recent 10/24/2017. CKD, stage 3; Risk Factors:Hypertension,                  Sleep Apnea and Current Smoker.  Sonographer:     Thea Norlander RCS Referring Phys:  [id number] MARGARET G ARNETT Diagnosing Phys: Evalene Lunger MD  IMPRESSIONS   1. Left ventricular ejection fraction, by estimation, is 60 to 65%. Left ventricular ejection fraction by PLAX is 61 %. The left ventricle has normal function. The left ventricle has no regional wall motion abnormalities. Left ventricular diastolic  parameters were normal. The average left ventricular global longitudinal strain is -20.6 %.  2. Right ventricular  systolic function is normal. The right ventricular size is normal. Tricuspid regurgitation signal is inadequate for assessing PA pressure.  3. The mitral valve is normal in structure. No evidence of mitral valve regurgitation. No evidence of mitral stenosis.  4. The aortic valve is tricuspid. Aortic valve regurgitation is mild. No aortic stenosis is present.  5. The inferior vena cava is normal in size with greater than 50% respiratory variability, suggesting right atrial pressure of 3 mmHg.  FINDINGS  Left Ventricle: Left ventricular ejection fraction, by estimation, is 60 to 65%. Left ventricular ejection fraction by PLAX is 61 %. The left ventricle has normal function. The left ventricle has no regional wall motion abnormalities. The average left  ventricular global longitudinal strain is -20.6 %. Strain was performed and the global longitudinal strain is indeterminate. The left ventricular internal cavity size was normal in size. There is no left ventricular hypertrophy. Left ventricular  diastolic parameters were normal.  Right Ventricle: The right ventricular size is normal. No increase in right ventricular wall thickness. Right ventricular systolic function is normal. Tricuspid regurgitation signal is inadequate for assessing PA pressure.  Left Atrium: Left atrial size was normal in size.  Right Atrium: Right atrial size was normal in size.  Pericardium: There is no evidence of pericardial effusion.  Mitral Valve: The mitral valve is normal in structure. No evidence of mitral valve regurgitation. No evidence of mitral valve stenosis.  Tricuspid Valve: The tricuspid valve is normal in structure. Tricuspid valve regurgitation is not demonstrated. No evidence of tricuspid stenosis.  Aortic Valve: The aortic valve is tricuspid. Aortic valve regurgitation is mild. Aortic regurgitation PHT measures 493 msec. No aortic stenosis is present. Aortic valve peak gradient measures 9.5  mmHg.  Pulmonic Valve: The pulmonic valve was normal in structure. Pulmonic valve regurgitation is not visualized. No evidence of pulmonic stenosis.  Aorta: The aortic root is normal in size and structure.  Venous: The inferior vena cava is normal in size with greater than 50% respiratory variability, suggesting right atrial pressure of 3 mmHg.  IAS/Shunts: No atrial level shunt detected by color flow Doppler.  Additional Comments: 3D was performed not requiring image post processing on an independent workstation and was indeterminate.    LEFT VENTRICLE PLAX 2D LV EF:         Left            Diastology                ventricular     LV e' medial:    10.20 cm/s                ejection        LV E/e' medial:  7.9                fraction by     LV e' lateral:   12.10 cm/s                PLAX is 61      LV  E/e' lateral: 6.7                %. LVIDd:         4.90 cm         2D Longitudinal LVIDs:         3.30 cm         Strain LV PW:         1.10 cm         2D Strain GLS   -20.6 % LV IVS:        1.00 cm         Avg: LVOT diam:     2.10 cm LV SV:         71 LV SV Index:   33 LVOT Area:     3.46 cm    RIGHT VENTRICLE             IVC RV S prime:     11.40 cm/s  IVC diam: 1.80 cm  LEFT ATRIUM             Index        RIGHT ATRIUM           Index LA diam:        3.50 cm 1.62 cm/m   RA Area:     12.00 cm LA Vol (A2C):   38.8 ml 17.95 ml/m  RA Volume:   24.40 ml  11.29 ml/m LA Vol (A4C):   44.6 ml 20.63 ml/m LA Biplane Vol: 47.1 ml 21.79 ml/m  AORTIC VALVE AV Area (Vmax): 2.47 cm AV Vmax:        154.50 cm/s AV Peak Grad:   9.5 mmHg LVOT Vmax:      110.00 cm/s LVOT Vmean:     67.100 cm/s LVOT VTI:       0.204 m AI PHT:         493 msec   AORTA Ao Root diam: 3.00 cm Ao Asc diam:  3.10 cm  MITRAL VALVE MV Area (PHT): 3.65 cm    SHUNTS MV Decel Time: 208 msec    Systemic VTI:  0.20 m MV E velocity: 80.70 cm/s  Systemic Diam: 2.10 cm MV A velocity: 79.10 cm/s MV E/A  ratio:  1.02  Evalene Lunger MD Electronically signed by Evalene Lunger MD Signature Date/Time: 08/03/2024/4:09:52 PM      Final   Note: Reviewed        Physical Exam  Vitals: BP (!) 130/101 (BP Location: Right Arm, Patient Position: Sitting, Cuff Size: Large)   Pulse 79   Temp (!) 97.2 F (36.2 C) (Temporal)   Resp 18   Ht 5' 3 (1.6 m)   Wt 260 lb (117.9 kg)   SpO2 98%   BMI 46.06 kg/m  BMI: Estimated body mass index is 46.06 kg/m as calculated from the following:   Height as of this encounter: 5' 3 (1.6 m).   Weight as of this encounter: 260 lb (117.9 kg). Ideal: Ideal body weight: 52.4 kg (115 lb 8.3 oz) Adjusted ideal body weight: 78.6 kg (173 lb 5 oz) General appearance: Well nourished, well developed, and well hydrated. In no apparent acute distress Mental status: Alert, oriented x 3 (person, place, & time)       Respiratory: No evidence of acute respiratory distress Eyes: PERLA  Musculoskeletal:  + Bilateral knee pain worse with weightbearing, standing, and walking  Assessment   Diagnosis Status  1. Bilateral primary osteoarthritis of knee  2. Bilateral chronic knee pain   3. Chronic pain syndrome   4. Medication management    Having a Flare-up Having a Flare-up Controlled   Updated Problems: No problems updated.  Plan of Care  Problem-specific:  Assessment and Plan    Bilateral primary osteoarthritis of knees with chronic right knee pain Chronic right knee pain from bilateral primary osteoarthritis, with significant medial and lateral knee pain, worsened by ambulation, causing instability and falls. Previous genicular nerve block provided 50% relief. - Scheduled genicular nerve block for right knee on November 13th. - Continue Balbuca for one month.  Chronic pain syndrome Managed with Balbuca, no side effects but perceived lack of efficacy. - Continue Balbuca for one month.  Patient's pain is well-controlled with Belbuca  300 mcg film, will  continue on current medication regimen.  Prescribing drug monitoring (PMP) reviewed, findings consistent with the use of prescribed medication and no evidence of narcotic misuse or abuse.  Urine drug screening (UDS) up to date.  No side effects or any adverse reaction reported to medication.  Schedule follow-up in 1 month for medication management.   Medication management: Meds ordered this encounter  Medications   Buprenorphine  HCl (BELBUCA ) 300 MCG FILM    Sig: Place 1 Film inside cheek every 12 (twelve) hours as needed.    Dispense:  60 each    Refill:  0        Heather Sweeney has a current medication list which includes the following long-term medication(s): albuterol , amlodipine , bupropion, famotidine , fluoxetine , fluticasone , furosemide , metoprolol  succinate, and omeprazole .  Pharmacotherapy (Medications Ordered): Meds ordered this encounter  Medications   Buprenorphine  HCl (BELBUCA ) 300 MCG FILM    Sig: Place 1 Film inside cheek every 12 (twelve) hours as needed.    Dispense:  60 each    Refill:  0   Orders:  No orders of the defined types were placed in this encounter.       Return in about 1 month (around 09/15/2024) for (F2F), (MM), Emmy Blanch NP.    Recent Visits Date Type Provider Dept  07/28/24 Office Visit Araceli Arango K, NP [facility]  07/04/24 Office Visit Joelie Schou K, NP [facility]  06/01/24 Procedure visit Marcelino Nurse, MD [facility]  Showing recent visits within past 90 days and meeting all other requirements Today's Visits Date Type Provider Dept  08/16/24 Office Visit Endia Moncur K, NP [facility]  Showing today's visits and meeting all other requirements Future Appointments Date Type Provider Dept  08/22/24 Appointment Marcelino Nurse, MD [facility]  09/15/24 Appointment Jaidon Ellery K, NP [facility]  Showing future appointments within next 90 days and meeting all other  requirements  I discussed the assessment and treatment plan with the patient. The patient was provided an opportunity to ask questions and all were answered. The patient agreed with the plan and demonstrated an understanding of the instructions.  Patient advised to call back or seek an in-person evaluation if the symptoms or condition worsens.  I personally spent a total of 30 minutes in the care of the patient today including preparing to see the patient, getting/reviewing separately obtained history, performing a medically appropriate exam/evaluation, counseling and educating, placing orders, referring and communicating with other health care professionals, documenting clinical information in the EHR, independently interpreting results, communicating results, and coordinating care.   Note by: Adreanna Fickel K Libni Fusaro, NP (TTS and AI technology used. I apologize for any typographical errors that  were not detected and corrected.) Date: 08/16/2024; Time: 10:10 AM

## 2024-08-22 ENCOUNTER — Encounter: Payer: Self-pay | Admitting: Student in an Organized Health Care Education/Training Program

## 2024-08-22 ENCOUNTER — Ambulatory Visit
Admission: RE | Admit: 2024-08-22 | Discharge: 2024-08-22 | Disposition: A | Discharge status: Home / Self Care | Source: Ambulatory Visit | Attending: Student in an Organized Health Care Education/Training Program | Admitting: Student in an Organized Health Care Education/Training Program

## 2024-08-22 ENCOUNTER — Ambulatory Visit: Admitting: Student in an Organized Health Care Education/Training Program

## 2024-08-22 ENCOUNTER — Other Ambulatory Visit: Payer: Self-pay | Admitting: Student in an Organized Health Care Education/Training Program

## 2024-08-22 VITALS — BP 145/89 | HR 72 | Temp 97.5°F | Resp 20 | Ht 63.0 in | Wt 260.0 lb

## 2024-08-22 DIAGNOSIS — G8929 Other chronic pain: Secondary | ICD-10-CM | POA: Insufficient documentation

## 2024-08-22 DIAGNOSIS — R52 Pain, unspecified: Secondary | ICD-10-CM | POA: Diagnosis present

## 2024-08-22 DIAGNOSIS — M25562 Pain in left knee: Secondary | ICD-10-CM | POA: Insufficient documentation

## 2024-08-22 DIAGNOSIS — M25561 Pain in right knee: Secondary | ICD-10-CM | POA: Diagnosis present

## 2024-08-22 DIAGNOSIS — G894 Chronic pain syndrome: Secondary | ICD-10-CM

## 2024-08-22 DIAGNOSIS — M170 Bilateral primary osteoarthritis of knee: Secondary | ICD-10-CM | POA: Insufficient documentation

## 2024-08-22 MED ORDER — ROPIVACAINE HCL 2 MG/ML IJ SOLN
INTRAMUSCULAR | Status: AC
  Filled 2024-08-22: qty 20

## 2024-08-22 MED ORDER — LIDOCAINE HCL 2 % IJ SOLN
20.0000 mL | Freq: Once | INTRAMUSCULAR | Status: AC
  Administered 2024-08-22: 400 mg

## 2024-08-22 MED ORDER — DIAZEPAM 5 MG PO TABS
ORAL_TABLET | ORAL | Status: AC
  Filled 2024-08-22: qty 1

## 2024-08-22 MED ORDER — DIAZEPAM 5 MG PO TABS
ORAL_TABLET | ORAL | Status: AC
Start: 2024-08-22 — End: 2024-08-22
  Filled 2024-08-22: qty 1

## 2024-08-22 MED ORDER — DEXAMETHASONE SOD PHOSPHATE PF 10 MG/ML IJ SOLN
20.0000 mg | Freq: Once | INTRAMUSCULAR | Status: AC
  Administered 2024-08-22: 20 mg

## 2024-08-22 MED ORDER — DIAZEPAM 5 MG PO TABS
5.0000 mg | ORAL_TABLET | ORAL | Status: AC
  Administered 2024-08-22: 10 mg via ORAL

## 2024-08-22 MED ORDER — ROPIVACAINE HCL 2 MG/ML IJ SOLN
18.0000 mL | Freq: Once | INTRAMUSCULAR | Status: AC
  Administered 2024-08-22: 18 mL via PERINEURAL

## 2024-08-22 MED ORDER — LIDOCAINE HCL 2 % IJ SOLN
INTRAMUSCULAR | Status: AC
  Filled 2024-08-22: qty 20

## 2024-08-22 NOTE — Patient Instructions (Signed)

## 2024-08-22 NOTE — Progress Notes (Signed)
 Safety precautions to be maintained throughout the outpatient stay will include: orient to surroundings, keep bed in low position, maintain call bell within reach at all times, provide assistance with transfer out of bed and ambulation.

## 2024-08-22 NOTE — Progress Notes (Signed)
 PROVIDER NOTE: Interpretation of information contained herein should be left to medically-trained personnel. Specific patient instructions are provided elsewhere under Patient Instructions section of medical record. This document was created in part using STT-dictation technology, any transcriptional errors that may result from this process are unintentional.  Patient: Heather Sweeney Type: Established DOB: [birth year] MRN: [id number] PCP: Sampson Ethridge LABOR, MD  Service: Procedure DOS: 08/22/2024 Setting: Ambulatory Location: Ambulatory outpatient facility Delivery: Face-to-face Provider: Wallie Sherry, MD Specialty: Interventional Pain Management Specialty designation: 09 Location: Outpatient facility Ref. Prov.: Entzminger, Ethridge LABOR,*       Interventional Therapy     Procedure:          Anesthesia, Analgesia, Anxiolysis:  Type: Therapeutic Superolateral, Superomedial, and Inferomedial, Genicular Nerve Radiofrequency Ablation (destruction).   #1  Region: Lateral, Anterior, and Medial aspects of the knee joint, above and below the knee joint proper. Level: Superior and inferior to the knee joint. Laterality: Bilateral  Anesthesia: Local (1-2% Lidocaine )  Anxiolysis: Oral PO Valium  10 mg Sedation: None  Guidance: Fluoroscopy           Position: Supine   Indications: 1. Bilateral primary osteoarthritis of knee   2. Chronic pain syndrome   3. Bilateral chronic knee pain    Heather Sweeney has been dealing with the above chronic pain for longer than three months and has either failed to respond, was unable to tolerate, or simply did not get enough benefit from other more conservative therapies including, but not limited to: 1. Over-the-counter medications 2. Anti-inflammatory medications 3. Muscle relaxants 4. Membrane stabilizers 5. Opioids 6. Physical therapy and/or chiropractic manipulation 7. Modalities (Heat, ice, etc.) 8. Invasive techniques such as nerve blocks. Ms.  Sweeney has attained more than 50% relief of the pain from a series of diagnostic injections conducted in separate occasions.  Pain Score: Pre-procedure: 8 /10 Post-procedure: 0-No pain/10    H&P (Pre-op Assessment):  Heather Sweeney is a 58 y.o. (year old), female patient, seen today for interventional treatment. She  has a past surgical history that includes Abdominal hysterectomy (07/08/2011); Cholecystectomy (N/A, 01/12/2018); Foot surgery; Cesarean section; Dilation and curettage of uterus; LEEP; Tubal ligation; Carpal tunnel release; Colonoscopy with propofol  (N/A, 04/15/2019); Esophagogastroduodenoscopy (egd) with propofol  (N/A, 10/23/2020); Flexible sigmoidoscopy (N/A, 02/15/2021); Panniculectomy (N/A, 04/24/2022); Right hand carpal tunnel release (Right, 10/2022); scar tissue repair (04/2023); and Esophagogastroduodenoscopy (egd) with propofol  (N/A, 11/05/2023). Heather Sweeney has a current medication list which includes the following prescription(s): acetaminophen , albuterol , amlodipine , abilify maintena, benztropine , belbuca , [START ON 09/03/2024] belbuca , bupropion, buspirone, diclofenac , epinephrine , famotidine , fluoxetine , fluphenazine , fluticasone , furosemide , hydroxyzine , insulin  pen needle, metoprolol  succinate, mometasone, multiple vitamins-minerals, naloxone, nicotine , nicotine  polacrilex, omeprazole , ondansetron , zepbound , and topiramate . Her primarily concern today is the Knee Pain (right)  Initial Vital Signs:  Pulse/HCG Rate: 72ECG Heart Rate: 72 Temp: (!) 97.5 F (36.4 C) Resp: 16 BP: 134/80 SpO2: 100 %  BMI: Estimated body mass index is 46.06 kg/m as calculated from the following:   Height as of this encounter: 5' 3 (1.6 m).   Weight as of this encounter: 260 lb (117.9 kg).  Risk Assessment: Allergies: Reviewed. She is allergic to hydrochlorothiazide , keflex [cephalexin], lisinopril, losartan , penicillins, bee venom, and metformin .  Allergy Precautions: None  required Coagulopathies: Reviewed. None identified.  Blood-thinner therapy: None at this time Active Infection(s): Reviewed. None identified. Heather Sweeney is afebrile  Site Confirmation: Heather Sweeney was asked to confirm the procedure and laterality before marking the site Procedure checklist: Completed Consent: Before the procedure and under the  influence of no sedative(s), amnesic(s), or anxiolytics, the patient was informed of the treatment options, risks and possible complications. To fulfill our ethical and legal obligations, as recommended by the American Medical Association's Code of Ethics, I have informed the patient of my clinical impression; the nature and purpose of the treatment or procedure; the risks, benefits, and possible complications of the intervention; the alternatives, including doing nothing; the risk(s) and benefit(s) of the alternative treatment(s) or procedure(s); and the risk(s) and benefit(s) of doing nothing. The patient was provided information about the general risks and possible complications associated with the procedure. These may include, but are not limited to: failure to achieve desired goals, infection, bleeding, organ or nerve damage, allergic reactions, paralysis, and death. In addition, the patient was informed of those risks and complications associated to the procedure, such as failure to decrease pain; infection; bleeding; organ or nerve damage with subsequent damage to sensory, motor, and/or autonomic systems, resulting in permanent pain, numbness, and/or weakness of one or several areas of the body; allergic reactions; (i.e.: anaphylactic reaction); and/or death. Furthermore, the patient was informed of those risks and complications associated with the medications. These include, but are not limited to: allergic reactions (i.e.: anaphylactic or anaphylactoid reaction(s)); adrenal axis suppression; blood sugar elevation that in diabetics may result in ketoacidosis  or comma; water retention that in patients with history of congestive heart failure may result in shortness of breath, pulmonary edema, and decompensation with resultant heart failure; weight gain; swelling or edema; medication-induced neural toxicity; particulate matter embolism and blood vessel occlusion with resultant organ, and/or nervous system infarction; and/or aseptic necrosis of one or more joints. Finally, the patient was informed that Medicine is not an exact science; therefore, there is also the possibility of unforeseen or unpredictable risks and/or possible complications that may result in a catastrophic outcome. The patient indicated having understood very clearly. We have given the patient no guarantees and we have made no promises. Enough time was given to the patient to ask questions, all of which were answered to the patient's satisfaction. Heather Sweeney has indicated that she wanted to continue with the procedure. Attestation: I, the ordering provider, attest that I have discussed with the patient the benefits, risks, side-effects, alternatives, likelihood of achieving goals, and potential problems during recovery for the procedure that I have provided informed consent. Date  Time: 08/22/2024  9:28 AM  Pre-Procedure Preparation:  Monitoring: As per clinic protocol. Respiration, ETCO2, SpO2, BP, heart rate and rhythm monitor placed and checked for adequate function Safety Precautions: Patient was assessed for positional comfort and pressure points before starting the procedure. Time-out: I initiated and conducted the Time-out before starting the procedure, as per protocol. The patient was asked to participate by confirming the accuracy of the Time Out information. Verification of the correct person, site, and procedure were performed and confirmed by me, the nursing staff, and the patient. Time-out conducted as per Joint Commission's Universal Protocol (UP.01.01.01). Time: 1119 Start  Time: 1119 hrs.  Description of Procedure:          Target Area: For Genicular Nerve radiofrequency ablation (destruction), the targets are: the superolateral genicular nerve, located in the lateral distal portion of the femoral shaft as it curves to form the lateral epicondyle, in the region of the distal femoral metaphysis; the superomedial genicular nerve, located in the medial distal portion of the femoral shaft as it curves to form the medial epicondyle; and the inferomedial genicular nerve, located in the medial, proximal  portion of the tibial shaft, as it curves to form the medial epicondyle, in the region of the proximal tibial metaphysis. Approach: Anterior, ipsilateral approach. Area Prepped: Entire knee area, from mid-thigh to mid-shin, lateral, anterior, and medial aspects. ChloraPrep (2% chlorhexidine  gluconate and 70% isopropyl alcohol ) Safety Precautions: Aspiration looking for blood return was conducted prior to all injections. At no point did we inject any substances, as a needle was being advanced. No attempts were made at seeking any paresthesias. Safe injection practices and needle disposal techniques used. Medications properly checked for expiration dates. SDV (single dose vial) medications used. Description of the Procedure: Protocol guidelines were followed. The patient was placed in position over the procedure table. The target area was identified and the area prepped in the usual manner. The skin and muscle were infiltrated with local anesthetic. Appropriate amount of time allowed to pass for local anesthetics to take effect. Radiofrequency needles were introduced to the target area using fluoroscopic guidance. Using the NeuroTherm NT1100 Radiofrequency Generator, sensory stimulation using 50 Hz was used to locate & identify the nerve, making sure that the needle was positioned such that there was no sensory stimulation below 0.3 V or above 0.7 V. Stimulation using 2 Hz was used to  evaluate the motor component. Care was taken not to lesion any nerves that demonstrated motor stimulation of the lower extremities at an output of less than 2.5 times that of the sensory threshold, or a maximum of 2.0 V. Once satisfactory placement of the needles was achieved, the numbing solution was slowly injected after negative aspiration. After waiting for at least 2 minutes, the ablation was performed at 80 degrees C for 60 seconds, using regular Radiofrequency settings. Once the procedure was completed, the needles were then removed and the area cleansed, making sure to leave some of the prepping solution back to take advantage of its long term bactericidal properties. Intra-operative Compliance: Compliant           Vitals:   08/22/24 1132 08/22/24 1137 08/22/24 1144 08/22/24 1146  BP: (!) 144/77 132/87 133/68 (!) 145/89  Pulse:      Resp: 17 14 (!) 21 20  Temp:      SpO2: 100% 100% 100% 98%  Weight:      Height:        Start Time: 1119 hrs. End Time: 1144 hrs. Materials & Medications:  Needle(s) Type: Teflon-coated, curved tip, Radiofrequency needle(s) Gauge: 22G Length: 10cm Medication(s): Please see orders for medications and dosing details. 2 cc injected at each level bilaterally after sensorimotor testing Imaging Guidance (Non-Spinal):          Type of Imaging Technique: Fluoroscopy Guidance (Non-Spinal) Indication(s): Fluoroscopy guidance for needle placement to enhance accuracy in procedures requiring precise needle localization for targeted delivery of medication in or near specific anatomical locations not easily accessible without such real-time imaging assistance. Exposure Time: Please see nurses notes. Contrast: Before injecting any contrast, we confirmed that the patient did not have an allergy to iodine, shellfish, or radiological contrast. Once satisfactory needle placement was completed at the desired level, radiological contrast was injected. Contrast injected  under live fluoroscopy. No contrast complications. See chart for type and volume of contrast used. Fluoroscopic Guidance: I was personally present during the use of fluoroscopy. Tunnel Vision Technique used to obtain the best possible view of the target area. Parallax error corrected before commencing the procedure. Direction-depth-direction technique used to introduce the needle under continuous pulsed fluoroscopy. Once target was reached, antero-posterior, oblique,  and lateral fluoroscopic projection used confirm needle placement in all planes. Images permanently stored in EMR. Interpretation: I personally interpreted the imaging intraoperatively. Adequate needle placement confirmed in multiple planes. Appropriate spread of contrast into desired area was observed. No evidence of afferent or efferent intravascular uptake. Permanent images saved into the patient's record.  Antibiotic Prophylaxis:   Anti-infectives (From admission, onward)    None      Indication(s): None identified  Post-operative Assessment:  Post-procedure Vital Signs:  Pulse/HCG Rate: 7271 Temp: (!) 97.5 F (36.4 C) Resp: 20 BP: (!) 145/89 SpO2: 98 %  EBL: None  Complications: No immediate post-treatment complications observed by team, or reported by patient.  Note: The patient tolerated the entire procedure well. A repeat set of vitals were taken after the procedure and the patient was kept under observation following institutional policy, for this type of procedure. Post-procedural neurological assessment was performed, showing return to baseline, prior to discharge. The patient was provided with post-procedure discharge instructions, including a section on how to identify potential problems. Should any problems arise concerning this procedure, the patient was given instructions to immediately contact us , at any time, without hesitation. In any case, we plan to contact the patient by telephone for a follow-up  status report regarding this interventional procedure.  Comments:  No additional relevant information.  Plan of Care (POC)  Orders:  No orders of the defined types were placed in this encounter.    Medications ordered for procedure: Meds ordered this encounter  Medications   lidocaine  (XYLOCAINE ) 2 % (with pres) injection 400 mg   diazepam  (VALIUM ) tablet 5 mg    Make sure Flumazenil is available in the pyxis when using this medication. If oversedation occurs, administer 0.2 mg IV over 15 sec. If after 45 sec no response, administer 0.2 mg again over 1 min; may repeat at 1 min intervals; not to exceed 4 doses (1 mg)   ropivacaine  (PF) 2 mg/mL (0.2%) (NAROPIN ) injection 18 mL   dexamethasone  (DECADRON ) injection 20 mg   Medications administered: We administered lidocaine , diazepam , ropivacaine  (PF) 2 mg/mL (0.2%), and dexamethasone .  See the medical record for exact dosing, route, and time of administration.    Bilateral genicular nerve block 06/01/2024; RFA 08/22/24    Follow-up plan:   Return in about 8 weeks (around 10/17/2024).     Recent Visits Date Type Provider Dept  08/16/24 Office Visit Patel, Seema K, NP [facility]  07/28/24 Office Visit Patel, Seema K, NP [facility]  07/04/24 Office Visit Patel, Seema K, NP [facility]  06/01/24 Procedure visit Marcelino Nurse, MD [facility]  Showing recent visits within past 90 days and meeting all other requirements Today's Visits Date Type Provider Dept  08/22/24 Procedure visit Marcelino Nurse, MD [facility]  Showing today's visits and meeting all other requirements Future Appointments Date Type Provider Dept  09/15/24 Appointment Patel, Seema K, NP [facility]  10/12/24 Appointment Patel, Seema K, NP [facility]  Showing future appointments within next 90 days and meeting all other requirements   Disposition: Discharge home  Discharge (Date  Time):  08/22/2024; 1150 hrs.   Primary Care Physician: Sampson Ethridge LABOR, MD Location: [facility] Note by: Nurse Marcelino, MD (TTS technology used. I apologize for any typographical errors that were not detected and corrected.) Date: 08/22/2024; Time: 12:25 PM  Disclaimer:  Medicine is not an visual merchandiser. The only guarantee in medicine is that nothing  is guaranteed. It is important to note that the decision to proceed with this intervention was based on the information collected from the patient. The Data and conclusions were drawn from the patient's questionnaire, the interview, and the physical examination. Because the information was provided in large part by the patient, it cannot be guaranteed that it has not been purposely or unconsciously manipulated. Every effort has been made to obtain as much relevant data as possible for this evaluation. It is important to note that the conclusions that lead to this procedure are derived in large part from the available data. Always take into account that the treatment will also be dependent on availability of resources and existing treatment guidelines, considered by other Pain Management Practitioners as being common knowledge and practice, at the time of the intervention. For Medico-Legal purposes, it is also important to point out that variation in procedural techniques and pharmacological choices are the acceptable norm. The indications, contraindications, technique, and results of the above procedure should only be interpreted and judged by a Board-Certified Interventional Pain Specialist with extensive familiarity and expertise in the same exact procedure and technique.

## 2024-08-23 ENCOUNTER — Telehealth: Payer: Self-pay | Admitting: *Deleted

## 2024-08-23 NOTE — Telephone Encounter (Signed)
 Called for post procedure follow-up. Mailbox full.

## 2024-09-13 NOTE — Progress Notes (Unsigned)
 PROVIDER NOTE: Interpretation of information contained herein should be left to medically-trained personnel. Specific patient instructions are provided elsewhere under Patient Instructions section of medical record. This document was created in part using AI and STT-dictation technology, any transcriptional errors that may result from this process are unintentional.  Patient: Heather Sweeney  Service: E/M   PCP: Sampson Ethridge LABOR, MD  DOB: July 09, 1966  DOS: 09/15/2024  Provider: Emmy MARLA Blanch, NP  MRN: [id number]  Delivery: Face-to-face  Specialty: Interventional Pain Management  Type: Established Patient  Setting: Ambulatory outpatient facility  Specialty designation: 09  Referring Prov.: Entzminger, Ethridge LABOR,*  Location: Outpatient office facility       History of present illness (HPI) Ms. Heather Sweeney, a 58 y.o. year old female, is here today because of her No primary diagnosis found.. Heather Sweeney primary complain today is No chief complaint on file.  Pertinent problems: Heather Sweeney has Essential hypertension; Bilateral carpal tunnel syndrome; Morbid obesity with BMI of 40.0-44.9, adult (HCC); OSA on CPAP; Chronic pain; Mood disorder (HCC)--Bipolar; Vitamin D  deficiency; Mitral valve insufficiency; Gastroesophageal reflux disease; Right knee pain; Bilateral primary osteoarthritis of knee; Lumbosacral radiculitis; Insomnia; Stage 3a chronic kidney disease (HCC); Morbid obesity (HCC) BMI=42.9; Medication management; Lumbar facet arthropathy; Chronic radicular lumbar pain; and Right hip pain on their pertinent problem list.  Pain Assessment: Severity of   is reported as a  /10. Location:    / . Onset:  . Quality:  . Timing:  . Modifying factor(s):  SABRA Vitals:  vitals were not taken for this visit.  BMI: Estimated body mass index is 46.06 kg/m as calculated from the following:   Height as of 08/22/24: 5' 3 (1.6 m).   Weight as of 08/22/24: 260 lb (117.9 kg).  Last encounter:  08/16/2024. Last procedure: Visit date not found.  Reason for encounter: both, medication management and post-procedure evaluation and assessment.   Discussed the use of AI scribe software for clinical note transcription with the patient, who gave verbal consent to proceed.  History of Present Illness          Procedure Procedure:           Anesthesia, Analgesia, Anxiolysis:  Type: Therapeutic Superolateral, Superomedial, and Inferomedial, Genicular Nerve Radiofrequency Ablation (destruction).   #1  Region: Lateral, Anterior, and Medial aspects of the knee joint, above and below the knee joint proper. Level: Superior and inferior to the knee joint. Laterality: Bilateral   Anesthesia: Local (1-2% Lidocaine )  Anxiolysis: Oral PO Valium  10 mg Sedation: None  Guidance: Fluoroscopy             Position: Supine    Indications: 1. Bilateral primary osteoarthritis of knee   2. Chronic pain syndrome   3. Bilateral chronic knee pain     Heather Sweeney has been dealing with the above chronic pain for longer than three months and has either failed to respond, was unable to tolerate, or simply did not get enough benefit from other more conservative therapies including, but not limited to: 1. Over-the-counter medications 2. Anti-inflammatory medications 3. Muscle relaxants 4. Membrane stabilizers 5. Opioids 6. Physical therapy and/or chiropractic manipulation 7. Modalities (Heat, ice, etc.) 8. Invasive techniques such as nerve blocks. Heather Sweeney has attained more than 50% relief of the pain from a series of diagnostic injections conducted in separate occasions.   Pain Score: Pre-procedure: 8 /10 Post-procedure: 0-No pain/10  Post-Procedure Evaluation    Effectiveness:  Initial hour after procedure:   ***. Subsequent  4-6 hours post-procedure:   ***. Analgesia past initial 6 hours:   ***. Ongoing improvement:  Analgesic:  *** Function: {Blank single:19197::No benefit,No  improvement,Back to baseline,Transient improvement,Heather Sweeney reports improvement in function,Somewhat improved,Minimal improvement,   ***   } ROM: {Blank single:19197::No benefit,No improvement,Back to baseline,Transient improvement,Heather Sweeney reports improvement in ROM,Somewhat improved,Minimal improvement,   ***   } Interpretation: ***   Pharmacotherapy Assessment   Belbuca  300 mcg film every 12 hours as needed for pain. Monitoring: [place] PMP: PDMP reviewed during this encounter.       Pharmacotherapy: No side-effects or adverse reactions reported. Compliance: No problems identified. Effectiveness: Clinically acceptable.  No notes on file  UDS:  Summary  Date Value Ref Range Status  05/17/2024 FINAL  Final    Comment:    ==================================================================== Compliance Drug Analysis, Ur ==================================================================== Test                             Result       Flag       Units  Drug Present and Declared for Prescription Verification   Bupropion                      PRESENT      EXPECTED   Hydroxybupropion               PRESENT      EXPECTED    Hydroxybupropion is an expected metabolite of bupropion.    Fluoxetine                      PRESENT      EXPECTED   Norfluoxetine                  PRESENT      EXPECTED    Norfluoxetine is an expected metabolite of fluoxetine .    Fluphenazine                    PRESENT      EXPECTED   Aripiprazole                   PRESENT      EXPECTED   Benztropine                     PRESENT      EXPECTED   Metoprolol                      PRESENT      EXPECTED  Drug Absent but Declared for Prescription Verification   Buprenorphine                   Not Detected UNEXPECTED ng/mg creat    Low dose buprenorphine  is not always detected even when used as    directed.    Topiramate                      Not Detected UNEXPECTED   Duloxetine                       Not Detected UNEXPECTED   Hydroxyzine                     Not Detected UNEXPECTED   Lidocaine                       Not Detected  UNEXPECTED    Lidocaine , as indicated in the declared medication list, is not    always detected even when used as directed.  ==================================================================== Test                      Result    Flag   Units      Ref Range   Creatinine              51               mg/dL      >=79 ==================================================================== Declared Medications:  The flagging and interpretation on this report are based on the  following declared medications.  Unexpected results may arise from  inaccuracies in the declared medications.   **Note: The testing scope of this panel includes these medications:   Aripiprazole  Benztropine   Bupropion (Wellbutrin XL)  Duloxetine  (Cymbalta )  Fluoxetine  (Prozac )  Fluphenazine   Hydroxyzine  (Atarax )  Metoprolol  (Toprol )  Topiramate  (Topamax )   **Note: The testing scope of this panel does not include small to  moderate amounts of these reported medications:   Buprenorphine   Topical Lidocaine  (Lidoderm )   **Note: The testing scope of this panel does not include the  following reported medications:   Albuterol  (Ventolin  HFA)  Amlodipine   Buspirone (Buspar)  Epinephrine  (EpiPen )  Famotidine  (Pepcid )  Fluticasone  (Flonase )  Insulin   Omalizumab  (Xolair )  Omeprazole  (Prilosec)  Ondansetron  (Zofran ) ==================================================================== For clinical consultation, please call [phone]. ====================================================================     No results found for: CBDTHCR No results found for: D8THCCBX No results found for: D9THCCBX  ROS  Constitutional: Denies any fever or chills Gastrointestinal: No reported hemesis, hematochezia, vomiting, or acute GI distress Musculoskeletal: Denies any acute onset  joint swelling, redness, loss of ROM, or weakness Neurological: No reported episodes of acute onset apraxia, aphasia, dysarthria, agnosia, amnesia, paralysis, loss of coordination, or loss of consciousness  Medication Review  ARIPiprazole ER, Buprenorphine  HCl, EPINEPHrine , FLUoxetine , Insulin  Pen Needle, Multiple Vitamins-Minerals, acetaminophen , albuterol , amLODipine , benztropine , buPROPion, busPIRone, diclofenac , famotidine , fluPHENAZine , fluticasone , furosemide , hydrOXYzine , metoprolol  succinate, mometasone, naloxone, nicotine , nicotine  polacrilex, omeprazole , ondansetron , tirzepatide , and topiramate   History Review  Allergy: Ms. Mast is allergic to hydrochlorothiazide , keflex [cephalexin], lisinopril, losartan , penicillins, bee venom, and metformin . Drug: Ms. Kobler  reports that she does not currently use drugs after having used the following drugs: Marijuana. Alcohol :  reports that she does not currently use alcohol . Tobacco:  reports that she has quit smoking. Her smoking use included e-cigarettes. Her smokeless tobacco use includes chew. Social: Ms. Felicetti  reports that she has quit smoking. Her smoking use included e-cigarettes. Her smokeless tobacco use includes chew. She reports that she does not currently use alcohol . She reports that she does not currently use drugs after having used the following drugs: Marijuana. 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), Arthritis of knee, right, Arthritis of right knee (10/25/2020), Asthma, Bacterial vaginosis (10/20/2019), Bipolar disorder (HCC), BMI 37.0-37.9, adult (03/07/2022), BMI 40.0-44.9, adult (HCC) (06/17/2022), Bronchiectasis without complication (HCC) (04/04/2022), Chronic low back pain, Chronic midline low back pain (12/17/2021), Class 3 severe obesity  due to excess calories in adult [facility]) (04/09/2022), CTS (carpal tunnel syndrome), Depression, Diabetes mellitus without complication (HCC), Early satiety (02/11/2022), Elevated liver enzymes (08/17/2020), Encounter for screening colonoscopy, Encounter for screening for COVID-19 (10/10/2022), Frequent PVCs (09/24/2016), Gallstones, GERD (gastroesophageal reflux disease), History of prediabetes,  Hives (02/23/2019), Hypertension, Hypokalemia (12/13/2021), Incontinence of feces (02/12/2021), Ingrown right big toenail (04/20/2020), Iron deficiency anemia (12/13/2021), Laceration of right foot, Lymphedema (08/24/2022), Obesity (BMI 30-39.9) (09/10/2021), Onychomycosis, OSA on CPAP, Osteoarthritis of knee (12/17/2021), Overactive bladder, Pain and swelling of right knee (04/20/2020), PVC's (premature ventricular contractions), PVC's (premature ventricular contractions), S/P laparoscopic cholecystectomy April 2019 (01/12/2018), Seizures (HCC), Tachycardia (10/10/2022), Urticaria (02/23/2019), and Weight loss (02/11/2022). Surgical: Ms. Tantillo  has a past surgical history that includes Abdominal hysterectomy (07/08/2011); Cholecystectomy (N/A, 01/12/2018); Foot surgery; Cesarean section; Dilation and curettage of uterus; LEEP; Tubal ligation; Carpal tunnel release; Colonoscopy with propofol  (N/A, 04/15/2019); Esophagogastroduodenoscopy (egd) with propofol  (N/A, 10/23/2020); Flexible sigmoidoscopy (N/A, 02/15/2021); Panniculectomy (N/A, 04/24/2022); Right hand carpal tunnel release (Right, 10/2022); scar tissue repair (04/2023); and Esophagogastroduodenoscopy (egd) with propofol  (N/A, 11/05/2023). Family: family history includes Asthma in her son; Breast cancer (age of onset: 57) in her sister; Cancer in her paternal aunt; Cancer (age of onset: 38) in her father; Early death (age of onset: 74) in her mother; Heart disease (age of onset: 38) in her mother; Heart failure in her mother and sister; Hypertension in her father and  mother; Mental illness in her sister.  Laboratory Chemistry Profile   Renal Lab Results  Component Value Date   BUN 12 06/15/2024   CREATININE 1.23 (H) 06/15/2024   LABCREA 56 02/24/2019   BCR 6 (L) 03/04/2022   GFR 49.30 (L) 06/15/2024   GFRAA >60 01/06/2018   GFRNONAA 57 (L) 05/21/2024    Hepatic Lab Results  Component Value Date   AST 20 05/20/2024   ALT 18 05/20/2024   ALBUMIN 3.6 05/20/2024   ALKPHOS 89 05/20/2024    Electrolytes Lab Results  Component Value Date   NA 138 06/15/2024   K 3.7 06/15/2024   CL 104 06/15/2024   CALCIUM 9.4 06/15/2024    Bone Lab Results  Component Value Date   VD25OH 29.74 (L) 09/22/2023    Inflammation (CRP: Acute Phase) (ESR: Chronic Phase) Lab Results  Component Value Date   CRP <1.0 02/24/2019   ESRSEDRATE 22 02/24/2019         Note: Above Lab results reviewed.  Recent Imaging Review  DG PAIN CLINIC C-ARM 1-60 MIN NO REPORT Fluoro was used, but no Radiologist interpretation will be provided.  Please refer to NOTES tab for provider progress note. Note: Reviewed        Physical Exam  Vitals: There were no vitals taken for this visit. BMI: Estimated body mass index is 46.06 kg/m as calculated from the following:   Height as of 08/22/24: 5' 3 (1.6 m).   Weight as of 08/22/24: 260 lb (117.9 kg). Ideal: Patient weight not recorded General appearance: Well nourished, well developed, and well hydrated. In no apparent acute distress Mental status: Alert, oriented x 3 (person, place, & time)       Respiratory: No evidence of acute respiratory distress Eyes: PERLA   Assessment   Diagnosis Status  1. Bilateral primary osteoarthritis of knee   2. Bilateral chronic knee pain   3. Chronic pain syndrome   4. Medication management    Controlled Controlled Controlled   Updated Problems: No problems updated.  Plan of Care  Problem-specific:  Assessment and Plan            Ms. KEITHA KOLK has a current  medication list which includes the following long-term medication(s): albuterol , amlodipine , bupropion, famotidine , fluoxetine , fluticasone , furosemide , metoprolol  succinate, and omeprazole .  Pharmacotherapy (Medications Ordered): No  orders of the defined types were placed in this encounter.  Orders:  No orders of the defined types were placed in this encounter.    {There is no content from the last Plan section.}   No follow-ups on file.    Recent Visits Date Type Provider Dept  08/22/24 Procedure visit Marcelino Nurse, MD [facility]  08/16/24 Office Visit Sharnette Kitamura K, NP [facility]  07/28/24 Office Visit Jilleen Essner K, NP [facility]  07/04/24 Office Visit Ronav Furney K, NP [facility]  Showing recent visits within past 90 days and meeting all other requirements Future Appointments Date Type Provider Dept  09/15/24 Appointment Eduar Kumpf K, NP [facility]  10/12/24 Appointment Iyanna Drummer K, NP [facility]  Showing future appointments within next 90 days and meeting all other requirements  I discussed the assessment and treatment plan with the patient. The patient was provided an opportunity to ask questions and all were answered. The patient agreed with the plan and demonstrated an understanding of the instructions.  Patient advised to call back or seek an in-person evaluation if the symptoms or condition worsens.  Duration of encounter: *** minutes.  Total time on encounter, as per AMA guidelines included both the face-to-face and non-face-to-face time personally spent by the physician and/or other qualified health care professional(s) on the day of the encounter (includes time in activities that require the physician or other qualified health care professional and does not include time in activities normally performed by clinical staff). Physician's time may include the following activities when  performed: Preparing to see the patient (e.g., pre-charting review of records, searching for previously ordered imaging, lab work, and nerve conduction tests) Review of prior analgesic pharmacotherapies. Reviewing PMP Interpreting ordered tests (e.g., lab work, imaging, nerve conduction tests) Performing post-procedure evaluations, including interpretation of diagnostic procedures Obtaining and/or reviewing separately obtained history Performing a medically appropriate examination and/or evaluation Counseling and educating the patient/family/caregiver Ordering medications, tests, or procedures Referring and communicating with other health care professionals (when not separately reported) Documenting clinical information in the electronic or other health record Independently interpreting results (not separately reported) and communicating results to the patient/ family/caregiver Care coordination (not separately reported)  Note by: Darrious Youman K Kendal Ghazarian, NP (TTS and AI technology used. I apologize for any typographical errors that were not detected and corrected.) Date: 09/15/2024; Time: 9:19 AM

## 2024-09-15 ENCOUNTER — Ambulatory Visit: Admitting: Nurse Practitioner

## 2024-09-15 DIAGNOSIS — G894 Chronic pain syndrome: Secondary | ICD-10-CM

## 2024-09-15 DIAGNOSIS — G8929 Other chronic pain: Secondary | ICD-10-CM

## 2024-09-15 DIAGNOSIS — M170 Bilateral primary osteoarthritis of knee: Secondary | ICD-10-CM

## 2024-09-15 DIAGNOSIS — Z79899 Other long term (current) drug therapy: Secondary | ICD-10-CM

## 2024-09-15 DIAGNOSIS — Z91199 Patient's noncompliance with other medical treatment and regimen due to unspecified reason: Secondary | ICD-10-CM

## 2024-09-26 ENCOUNTER — Ambulatory Visit: Payer: Self-pay

## 2024-09-26 ENCOUNTER — Telehealth: Payer: Self-pay | Admitting: Student in an Organized Health Care Education/Training Program

## 2024-09-26 NOTE — Telephone Encounter (Signed)
 Needs an appt. She was a no show for her follow-up on 09-15-24.

## 2024-09-26 NOTE — Telephone Encounter (Signed)
 Thank you :)

## 2024-09-26 NOTE — Telephone Encounter (Signed)
 PT wanted to know what she could do from this point her knee is still in pain. Pt wanted to come back into the office now to see Dr. Marcelino.

## 2024-09-26 NOTE — Telephone Encounter (Signed)
 FYI Only or Action Required?: FYI only for provider: appointment scheduled on 12/24.  Patient was last seen in primary care on 06/23/2024 by Dineen Rollene MATSU, FNP.  Called Nurse Triage reporting Ear Problem.  Symptoms began about a month ago.  Interventions attempted: Nothing.  Symptoms are: unchanged.  Triage Disposition: See Physician Within 24 Hours  Patient/caregiver understands and will follow disposition?: Yes, will follow disposition  Copied from CRM [id number]. Topic: Clinical - Red Word Triage >> Sep 26, 2024  2:31 PM Mesmerise C wrote: Red Word that prompted transfer to Nurse Triage: Patient states her ear has been itching keeps scratching causes it to bleed, nose has been clogged and knee pain and giving out along with temp fluctuations Reason for Disposition  Earache  (Exceptions: Brief ear pain of lasting less than 60 minutes, or earache occurring during air travel.)  Answer Assessment - Initial Assessment Questions 1. LOCATION: Which ear is involved?     R 2. ONSET: When did the ear pain start?      month 3. SEVERITY: How bad is the pain?  (Scale 1-10; mild, moderate or severe)     8-does not take OTC medications for pain 4. URI SYMPTOMS: Do you have a runny nose or cough?     Runny nose, cough 5. FEVER: Do you have a fever? If Yes, ask: What is your temperature, how was it measured, and when did it start?     denies 6. CAUSE: Have you been swimming recently?, How often do you use Q-TIPS?, Have you had any recent air travel or scuba diving?     Unsure, states it itches, she scratches, then it bleeds 7. OTHER SYMPTOMS: Do you have any other symptoms? (e.g., decreased hearing, dizziness, headache, stiff neck, vomiting)     Denies,  Protocols used: Earache-A-AH

## 2024-09-28 ENCOUNTER — Ambulatory Visit

## 2024-09-30 ENCOUNTER — Ambulatory Visit: Payer: Self-pay

## 2024-09-30 ENCOUNTER — Ambulatory Visit: Admitting: Student

## 2024-09-30 ENCOUNTER — Other Ambulatory Visit: Payer: Self-pay | Admitting: Internal Medicine

## 2024-09-30 ENCOUNTER — Other Ambulatory Visit: Payer: Self-pay | Admitting: Family

## 2024-09-30 DIAGNOSIS — I10 Essential (primary) hypertension: Secondary | ICD-10-CM

## 2024-09-30 NOTE — Telephone Encounter (Signed)
 Patient verbalized understanding of recommendation and agreed to proceed with visit to [facility]. No additional questions or concerns expressed at this time. Phone number provided for [facility] [place] and [place]  Patient instructed to call if symptoms worsen or if unable to be seen promptly.

## 2024-09-30 NOTE — Telephone Encounter (Signed)
 FYI Only or Action Required?: FYI only for provider: appointment scheduled on 09/30/24.   Patient was last seen in primary care on 06/23/2024 by Heather Rollene MATSU, FNP.  Called Nurse Triage reporting Leg Swelling and Pain.  Symptoms began several weeks ago.  Interventions attempted: Prescription medications: Buprenorphine  HCl (BELBUCA ) 300 MCG FILM.  Symptoms are: gradually worsening.  Triage Disposition: See HCP Within 4 Hours (Or PCP Triage)  Patient/caregiver understands and will follow disposition?: Yes  Reason for Disposition  [1] SEVERE pain (e.Sweeney., excruciating, unable to walk) AND [2] not improved after 2 hours of pain medicine  Answer Assessment - Initial Assessment Questions Patient states she has swelling in the right knee down to foot with severe throbbing pain. Has been taking Buprenorphine  HCl (BELBUCA ) 300 MCG FILM with no relief. She is able to ambulate, but it is painful and she feels like her knee is going to give out frequently. She does report recent falls. Office visit advised. Requesting referral to Ortho in [place].   1. LOCATION: Where is the swelling located?  (e.Sweeney., left, right, both knees)     Right leg  2. ONSET: When did the swelling start? Does it come and go, or is it there all the time?     A while ago ; constant and recently worsened  3. SWELLING: How bad is the swelling? Or, How large is it? (e.Sweeney., mild, moderate, severe; size of localized swelling)      Severe, from knee down to foot  4. PAIN: Is there any pain? If Yes, ask: How bad is it? (Scale 0-10; or none, mild, moderate, severe)     Yes, 9/10  5. SETTING: Has there been any recent work, exercise or other activity that involved that part of the body?      No  6. AGGRAVATING FACTORS: What makes the knee swelling worse? (e.Sweeney., walking, climbing stairs, running)     Walking, activity  7. ASSOCIATED SYMPTOMS: Is there any pain or redness?     Pain  8. OTHER SYMPTOMS:  Do you have any other symptoms? (e.Sweeney., calf pain, chest pain, difficulty breathing, fever)     Denies any other symptoms  9. PREGNANCY: Is there any chance you are pregnant? When was your last menstrual period?     NA  Protocols used: Knee Swelling-A-AH  Copied from CRM #[id number]. Topic: Clinical - Red Word Triage >> Sep 30, 2024  9:26 AM Heather Sweeney wrote: Heather Sweeney that prompted transfer to Nurse Triage: right knee swollen ( front to the back - going down the back ).. fell twice.. hurting

## 2024-10-03 ENCOUNTER — Ambulatory Visit (INDEPENDENT_AMBULATORY_CARE_PROVIDER_SITE_OTHER): Admitting: Vascular Surgery

## 2024-10-07 ENCOUNTER — Ambulatory Visit (INDEPENDENT_AMBULATORY_CARE_PROVIDER_SITE_OTHER): Admitting: Nurse Practitioner

## 2024-10-07 ENCOUNTER — Encounter: Payer: Self-pay | Admitting: Plastic Surgery

## 2024-10-07 ENCOUNTER — Ambulatory Visit: Admitting: Plastic Surgery

## 2024-10-07 VITALS — BP 160/76 | HR 82 | Ht 63.0 in | Wt 257.6 lb

## 2024-10-07 DIAGNOSIS — M793 Panniculitis, unspecified: Secondary | ICD-10-CM

## 2024-10-07 NOTE — Progress Notes (Signed)
" ° °  Subjective:    Patient ID: Heather Sweeney, female    DOB: [birth year], 59 y.o.   MRN: [id number]  The patient is a 59 year old female here for evaluation of her abdomen.  She is 5 feet 3 inches tall and when I first saw her in 2023 she was 205 pounds.  Since then she has increased her weight to 257 pounds.  In July 2023 she underwent a panniculectomy.  At this point I think that the weight gain has been significant and is certainly creating some stress for her.  I do not feel any sign of a hernia.  Her incision is very nicely healed.      Review of Systems  Constitutional: Negative.   HENT: Negative.    Eyes: Negative.   Respiratory: Negative.    Cardiovascular: Negative.   Gastrointestinal: Negative.   Endocrine: Negative.   Genitourinary: Negative.   Musculoskeletal: Negative.        Objective:   Physical Exam Vitals reviewed.  Constitutional:      Appearance: Normal appearance.  HENT:     Head: Atraumatic.  Cardiovascular:     Rate and Rhythm: Normal rate.     Pulses: Normal pulses.  Pulmonary:     Effort: Pulmonary effort is normal.  Abdominal:     General: There is no distension.     Palpations: Abdomen is soft.  Skin:    General: Skin is warm.     Capillary Refill: Capillary refill takes less than 2 seconds.  Neurological:     Mental Status: She is alert and oriented to person, place, and time.  Psychiatric:        Mood and Affect: Mood normal.        Behavior: Behavior normal.        Thought Content: Thought content normal.        Judgment: Judgment normal.           Assessment & Plan:     ICD-10-CM   1. Panniculitis  M79.3        I am going to refer her to healthy weight and wellness to see if we can get her going in the right direction.  50 pound weight gain since her surgery is not optimal for doing another surgery.  I like to see her back in 6 months. "

## 2024-10-10 ENCOUNTER — Ambulatory Visit (INDEPENDENT_AMBULATORY_CARE_PROVIDER_SITE_OTHER): Payer: 59 | Admitting: Vascular Surgery

## 2024-10-10 NOTE — Progress Notes (Addendum)
 PROVIDER NOTE: Interpretation of information contained herein should be left to medically-trained personnel. Specific patient instructions are provided elsewhere under Patient Instructions section of medical record. This document was created in part using AI and STT-dictation technology, any transcriptional errors that may result from this process are unintentional.  Patient: Heather Sweeney  Service: E/M   PCP: Heather Sweeney LABOR, MD  DOB: Mar 31, 1966  DOS: 10/12/2024  Provider: Emmy MARLA Blanch, NP  MRN: [id number]  Delivery: Face-to-face  Specialty: Interventional Pain Management  Type: Established Patient  Setting: Ambulatory outpatient facility  Specialty designation: 09  Referring Prov.: Entzminger, Sweeney LABOR, MD  Location: Outpatient office facility       History of present illness (HPI) Heather Sweeney, a 59 y.o. year old female, is here today because of her Lumbar facet arthropathy [M47.816]. Heather Sweeney primary complain today is Knee Pain and low back pain.  Pertinent problems: Heather Sweeney has Essential hypertension; Bilateral carpal tunnel syndrome; Morbid obesity with BMI of 40.0-44.9, adult (HCC); OSA on CPAP; Chronic pain; Mood disorder (HCC)--Bipolar; Vitamin D  deficiency; Mitral valve insufficiency; Gastroesophageal reflux disease; Right knee pain; Bilateral primary osteoarthritis of knee; Lumbosacral radiculitis; Insomnia; Stage 3a chronic kidney disease (HCC); Morbid obesity (HCC) BMI=42.9; Medication management; Lumbar facet arthropathy; Chronic radicular lumbar pain; and Right hip pain on their pertinent problem list.  Pain Assessment: Severity of Chronic pain is reported as a 8 /10. Location: Knee Right/Denies. Onset: More than a month ago. Quality: Throbbing. Timing: Constant. Modifying factor(s): Ice. Vitals:  height is 5' 3 (1.6 m) and weight is 250 lb (113.4 kg). Her temporal temperature is 97.7 F (36.5 C). Her blood pressure is 140/68 (abnormal) and her pulse is 79. Her  respiration is 18 and oxygen saturation is 98%.  BMI: Estimated body mass index is 44.29 kg/m as calculated from the following:   Height as of this encounter: 5' 3 (1.6 m).   Weight as of this encounter: 250 lb (113.4 kg).  Last encounter: 08/16/2024. Last procedure: Visit date not found.  Reason for encounter: evaluation for possible interventional PM therapy/treatment and medication management. No change in medical history since last visit.  Patient's pain is at baseline.  Patient continues multimodal pain regimen as prescribed.  States that it provides pain relief and improvement in functional status.   Discussed the use of AI scribe software for clinical note transcription with the patient, who gave verbal consent to proceed.  History of Present Illness   Heather Sweeney is a 59 year old female who presents with knee pain, low back pain and medication management consultation.  She experiences persistent burning pain in her calf and under the front of her kneecap, which worsens with walking and squatting. A popping sensation occurs during movement, and she has difficulty rising from a squatting position without support. She usually requires a cane to prevent falls, although she did not bring it today.  She underwent a bilateral knee radiofrequency ablation on  Initially, the local anesthesia provided relief, but the pain returned after the effect wore off. Her pain level was 9/10 before the procedure and reduced to 8/10 post-procedure, indicating minimal improvement. She continues to experience pain described as 'something pulling' when walking and squatting, with pain radiating down the leg.  She is currently on Belbuca  300 mcg, however, she reports that it does not touch her pain level. We discussed to increase belbuca  from 300 mcg to 450 mcg for better pain control and prednisone  taper for 9 days.  She has a history of back issues, including arthritis and hypertrophy at L4, L5, and  disc degeneration at L5 S1, confirmed by MRI. She experiences pain when bending and requires support to stand after bending. This pain impacts her daily activities, such as washing dishes and sweeping the floor, necessitating frequent breaks to sit down.  No diabetes.     Procedure Procedure:           Anesthesia, Analgesia, Anxiolysis:  Type: Therapeutic Superolateral, Superomedial, and Inferomedial, Genicular Nerve Radiofrequency Ablation (destruction).   #1  Region: Lateral, Anterior, and Medial aspects of the knee joint, above and below the knee joint proper. Level: Superior and inferior to the knee joint. Laterality: Bilateral   Anesthesia: Local (1-2% Lidocaine )  Anxiolysis: Oral PO Valium  10 mg Sedation: None  Guidance: Fluoroscopy             Position: Supine    Indications: 1. Bilateral primary osteoarthritis of knee   2. Chronic pain syndrome   3. Bilateral chronic knee pain     Heather Sweeney has been dealing with the above chronic pain for longer than three months and has either failed to respond, was unable to tolerate, or simply did not get enough benefit from other more conservative therapies including, but not limited to: 1. Over-the-counter medications 2. Anti-inflammatory medications 3. Muscle relaxants 4. Membrane stabilizers 5. Opioids 6. Physical therapy and/or chiropractic manipulation 7. Modalities (Heat, ice, etc.) 8. Invasive techniques such as nerve blocks. Heather Sweeney has attained more than 50% relief of the pain from a series of diagnostic injections conducted in separate occasions.   Pain Score: Pre-procedure: 8 /10 Post-procedure: 0-No pain/10  Post-Procedure Evaluation   Effectiveness:  Initial hour after procedure: 100 % . Subsequent 4-6 hours post-procedure: 100 % . Analgesia past initial 6 hours: 50 % . Ongoing improvement:  Analgesic:  50 % . Function: Somewhat improved ROM: Somewhat improved Interpretation: Heather Sweeney underwent a  diagnostic/therapeutic genicular nerve radiofrequency ablation (RFA) on August 22, 2024.  She reports initially 100% pain relief during local anesthetic phase, followed by sustained ongoing 50% pain relief and functional improvement since the procedure.  The patient reports that she does not require a cane as frequently as she did prior to the procedure.  She presented to today's visit without a cane.  Pharmacotherapy Assessment   Belbuca  450 mcg film every 12 hours as needed for pain. Prednisone  taper for 9 days Monitoring: [place] PMP: PDMP reviewed during this encounter.       Pharmacotherapy: No side-effects or adverse reactions reported. Compliance: No problems identified. Effectiveness: Clinically acceptable.  Heather Sweeney, [place]  10/12/2024 10:19 AM  Sign when Signing Visit Nursing Pain Medication Assessment:  Safety precautions to be maintained throughout the outpatient stay will include: orient to surroundings, keep bed in low position, maintain call bell within reach at all times, provide assistance with transfer out of bed and ambulation.  Medication Inspection Compliance: Pill count conducted under aseptic conditions, in front of the patient. Neither the pills nor the bottle was removed from the patient's sight at any time. Once count was completed pills were immediately returned to the patient in their original bottle.  Medication: Belbuca  Pill/Patch Count: 47 of 60 pills/patches remain Pill/Patch Appearance: Markings consistent with prescribed medication Bottle Appearance: Standard pharmacy container. Clearly labeled. Filled Date: 19 / 06 / 2025 Last Medication intake:  Day before yesterday    UDS:  Summary  Date Value Ref Range Status  05/17/2024 FINAL  Final    Comment:    ==================================================================== Compliance Drug Analysis, Ur ==================================================================== Test                              Result       Flag       Units  Drug Present and Declared for Prescription Verification   Bupropion                      PRESENT      EXPECTED   Hydroxybupropion               PRESENT      EXPECTED    Hydroxybupropion is an expected metabolite of bupropion.    Fluoxetine                      PRESENT      EXPECTED   Norfluoxetine                  PRESENT      EXPECTED    Norfluoxetine is an expected metabolite of fluoxetine .    Fluphenazine                    PRESENT      EXPECTED   Aripiprazole                   PRESENT      EXPECTED   Benztropine                     PRESENT      EXPECTED   Metoprolol                      PRESENT      EXPECTED  Drug Absent but Declared for Prescription Verification   Buprenorphine                   Not Detected UNEXPECTED ng/mg creat    Low dose buprenorphine  is not always detected even when used as    directed.    Topiramate                      Not Detected UNEXPECTED   Duloxetine                      Not Detected UNEXPECTED   Hydroxyzine                     Not Detected UNEXPECTED   Lidocaine                       Not Detected UNEXPECTED    Lidocaine , as indicated in the declared medication list, is not    always detected even when used as directed.  ==================================================================== Test                      Result    Flag   Units      Ref Range   Creatinine              51               mg/dL      >=79 ==================================================================== Declared Medications:  The flagging and interpretation on this report are based on the  following declared medications.  Unexpected results may arise from  inaccuracies in  the declared medications.   **Note: The testing scope of this panel includes these medications:   Aripiprazole  Benztropine   Bupropion (Wellbutrin XL)  Duloxetine  (Cymbalta )  Fluoxetine  (Prozac )  Fluphenazine   Hydroxyzine  (Atarax )  Metoprolol  (Toprol )  Topiramate   (Topamax )   **Note: The testing scope of this panel does not include small to  moderate amounts of these reported medications:   Buprenorphine   Topical Lidocaine  (Lidoderm )   **Note: The testing scope of this panel does not include the  following reported medications:   Albuterol  (Ventolin  HFA)  Amlodipine   Buspirone (Buspar)  Epinephrine  (EpiPen )  Famotidine  (Pepcid )  Fluticasone  (Flonase )  Insulin   Omalizumab  (Xolair )  Omeprazole  (Prilosec)  Ondansetron  (Zofran ) ==================================================================== For clinical consultation, please call [phone]. ====================================================================     No results found for: CBDTHCR No results found for: D8THCCBX No results found for: D9THCCBX  ROS  Constitutional: Denies any fever or chills Gastrointestinal: No reported hemesis, hematochezia, vomiting, or acute GI distress Musculoskeletal: Bilateral knee pain, low back pain (facet mediated pain) Neurological: No reported episodes of acute onset apraxia, aphasia, dysarthria, agnosia, amnesia, paralysis, loss of coordination, or loss of consciousness  Medication Review  ARIPiprazole ER, Buprenorphine  HCl, EPINEPHrine , FLUoxetine , Insulin  Pen Needle, Multiple Vitamins-Minerals, acetaminophen , albuterol , amLODipine , benztropine , buPROPion, busPIRone, diclofenac , famotidine , fluPHENAZine , fluticasone , furosemide , hydrOXYzine , metoprolol  succinate, mometasone, naloxone, nicotine , nicotine  polacrilex, omeprazole , ondansetron , predniSONE , tirzepatide , and topiramate   History Review  Allergy: Heather Sweeney is allergic to hydrochlorothiazide , keflex [cephalexin], lisinopril, losartan , penicillins, bee venom, and metformin . Drug: Heather Sweeney  reports that she does not currently use drugs after having used the following drugs: Marijuana. Alcohol :  reports that she does not currently use alcohol . Tobacco:  reports that she has  quit smoking. Her smoking use included e-cigarettes. Her smokeless tobacco use includes chew. Social: Heather Sweeney  reports that she has quit smoking. Her smoking use included e-cigarettes. Her smokeless tobacco use includes chew. She reports that she does not currently use alcohol . She reports that she does not currently use drugs after having used the following drugs: Marijuana. 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), Arthritis of knee, right, Arthritis of right knee (10/25/2020), Asthma, Bacterial vaginosis (10/20/2019), Bipolar disorder (HCC), BMI 37.0-37.9, adult (03/07/2022), BMI 40.0-44.9, adult (HCC) (06/17/2022), Bronchiectasis without complication (HCC) (04/04/2022), Chronic low back pain, Chronic midline low back pain (12/17/2021), Class 3 severe obesity due to excess calories in adult [facility]) (04/09/2022), CTS (carpal tunnel syndrome), Depression, Diabetes mellitus without complication (HCC), Early satiety (02/11/2022), Elevated liver enzymes (08/17/2020), Encounter for screening colonoscopy, Encounter for screening for COVID-19 (10/10/2022), Frequent PVCs (09/24/2016), Gallstones, GERD (gastroesophageal reflux disease), History of prediabetes, Hives (02/23/2019), Hypertension, Hypokalemia (12/13/2021), Incontinence of feces (02/12/2021), Ingrown right big toenail (04/20/2020), Iron deficiency anemia (12/13/2021), Laceration of right foot, Lymphedema (08/24/2022), Obesity (BMI 30-39.9) (09/10/2021), Onychomycosis, OSA on CPAP, Osteoarthritis of knee (12/17/2021), Overactive bladder, Pain and swelling of right knee (04/20/2020), PVC's (premature ventricular contractions), PVC's (premature ventricular contractions), S/P laparoscopic cholecystectomy April 2019 (01/12/2018), Seizures (HCC), Tachycardia (10/10/2022),  Urticaria (02/23/2019), and Weight loss (02/11/2022). Surgical: Heather Sweeney  has a past surgical history that includes Abdominal hysterectomy (07/08/2011); Cholecystectomy (N/A, 01/12/2018); Foot surgery; Cesarean section; Dilation and curettage of uterus; LEEP; Tubal ligation; Carpal tunnel release; Colonoscopy with propofol  (N/A, 04/15/2019); Esophagogastroduodenoscopy (egd) with propofol  (N/A, 10/23/2020); Flexible sigmoidoscopy (N/A, 02/15/2021); Panniculectomy (N/A, 04/24/2022); Right hand carpal tunnel release (Right, 10/2022); scar tissue repair (04/2023);  and Esophagogastroduodenoscopy (egd) with propofol  (N/A, 11/05/2023). Family: family history includes Asthma in her son; Breast cancer (age of onset: 68) in her sister; Cancer in her paternal aunt; Cancer (age of onset: 44) in her father; Early death (age of onset: 78) in her mother; Heart disease (age of onset: 12) in her mother; Heart failure in her mother and sister; Hypertension in her father and mother; Mental illness in her sister.  Laboratory Chemistry Profile   Renal Lab Results  Component Value Date   BUN 12 06/15/2024   CREATININE 1.23 (H) 06/15/2024   LABCREA 56 02/24/2019   BCR 6 (L) 03/04/2022   GFR 49.30 (L) 06/15/2024   GFRAA >60 01/06/2018   GFRNONAA 57 (L) 05/21/2024    Hepatic Lab Results  Component Value Date   AST 20 05/20/2024   ALT 18 05/20/2024   ALBUMIN 3.6 05/20/2024   ALKPHOS 89 05/20/2024    Electrolytes Lab Results  Component Value Date   NA 138 06/15/2024   K 3.7 06/15/2024   CL 104 06/15/2024   CALCIUM 9.4 06/15/2024    Bone Lab Results  Component Value Date   VD25OH 29.74 (L) 09/22/2023    Inflammation (CRP: Acute Phase) (ESR: Chronic Phase) Lab Results  Component Value Date   CRP <1.0 02/24/2019   ESRSEDRATE 22 02/24/2019         Note: Above Lab results reviewed.  Recent Imaging Review   Narrative & Impression  CLINICAL DATA:  Chronic low back pain. Patient fell in 2017 and  has persistent low back and buttock pain with right leg pain. No previous relevant surgery.   EXAM: MRI LUMBAR SPINE WITHOUT CONTRAST   TECHNIQUE: Multiplanar, multisequence MR imaging of the lumbar spine was performed. No intravenous contrast was administered.   COMPARISON:  Lumbar MRI 02/28/2021.  Radiographs 02/07/2015.   FINDINGS: Segmentation: Conventional anatomy assumed, with the last open disc space designated L5-S1.Concordant with prior imaging.   Alignment:  Minimal degenerative anterolisthesis at L4-5.   Vertebrae: No worrisome osseous lesion, acute fracture or pars defect. The visualized sacroiliac joints appear unremarkable.   Conus medullaris: Extends to the T12-L1 level. The conus and cauda equina appear normal. There is mildly progressive epidural fat in the lower lumbar spinal canal.   Paraspinal and other soft tissues: No significant paraspinal findings.   Disc levels:   Sagittal images demonstrate no significant disc space findings within the visualized lower thoracic spine.   L1-2: Preserved disc height with mild disc bulging. No spinal stenosis or nerve root encroachment.   L2-3: Mildly progressive loss of disc height with progressive annular disc bulging. Mild facet and ligamentous hypertrophy. No significant spinal stenosis or nerve root encroachment.   L3-4: Preserved disc height with mild disc bulging. Mildly progressive bilateral facet hypertrophy. No spinal stenosis or nerve root encroachment.   L4-5: Preserved disc height with mild disc bulging. Progressive moderate facet hypertrophy with new mild multifactorial spinal stenosis. Stable minimal foraminal narrowing.   L5-S1: Progressive loss of disc height with annular disc bulging and a broad-based central disc protrusion. Mild bilateral facet hypertrophy. Combined with increased epidural fat, there is resulting mild multifactorial spinal stenosis with mild to moderate foraminal  narrowing bilaterally.   IMPRESSION: 1. Mildly progressive spondylosis at L2-3 and L3-4 without resulting spinal stenosis or nerve root encroachment. 2. Progressive facet hypertrophy at L4-5 with new mild multifactorial spinal stenosis. 3. Progressive disc degeneration at L5-S1 with resulting mild multifactorial spinal stenosis and mild to moderate foraminal narrowing bilaterally.  4. No acute findings.  Mildly progressive lower lumbar epidural fat.     Electronically Signed   By: Elsie Perone M.D.   On: 11/08/2023 12:35   Physical Exam  Vitals: BP (!) 140/68 (BP Location: Right Arm, Patient Position: Sitting, Cuff Size: Normal)   Pulse 79   Temp 97.7 F (36.5 C) (Temporal)   Resp 18   Ht 5' 3 (1.6 m)   Wt 250 lb (113.4 kg)   SpO2 98%   BMI 44.29 kg/m  BMI: Estimated body mass index is 44.29 kg/m as calculated from the following:   Height as of this encounter: 5' 3 (1.6 m).   Weight as of this encounter: 250 lb (113.4 kg). Ideal: Ideal body weight: 52.4 kg (115 lb 8.3 oz) Adjusted ideal body weight: 76.8 kg (169 lb 5 oz) General appearance: Well nourished, well developed, and well hydrated. In no apparent acute distress Mental status: Alert, oriented x 3 (person, place, & time)       Respiratory: No evidence of acute respiratory distress Eyes: PERLA  Musculoskeletal: +LBP (facet mediated pain) Bilateral knee pain Lumbar Exam  Skin & Axial Inspection: No masses, redness, or swelling Alignment: Symmetrical Functional ROM: Pain restricted ROM affecting both sides Stability: No instability detected Muscle Tone/Strength: Functionally intact. No obvious neuro-muscular anomalies detected. Sensory (Neurological): Musculoskeletal pain pattern Palpation: No palpable anomalies       Provocative Tests: Hyperextension/rotation test: (+) bilaterally for facet joint pain. Lumbar quadrant test (Kemp's test): (+) bilaterally for facet joint pain. Lateral bending test: (+)  ipsilateral radicular pain, bilaterally. Positive for bilateral foraminal stenosis. *(Flexion, ABduction and External Rotation) Assessment   Diagnosis Status  1. Lumbar facet arthropathy   2. Lumbar spondylosis   3. Medication management   4. Bilateral primary osteoarthritis of knee   5. Chronic pain syndrome   6. Bilateral chronic knee pain   7. Chronic radicular lumbar pain    Persistent Persistent Controlled   Updated Problems: Problem  Lumbar Spondylosis  Bilateral Chronic Knee Pain    Plan of Care  Problem-specific:  Assessment and Plan    Chronic pain syndrome Significant knee and low back pain. Minimal relief from previous radiofrequency ablation. Current treatment includes Belbuca  300 mcg buccal film every 12 hours. - Increased Belbuca  to 450 mcg buccal film every 12 hours for one month. - Prescribed prednisone  taper starting with 60 mg for three days, then tapering to 40 mg and 20 mg for three days each. - Encouraged walking and water therapy.  Bilateral primary osteoarthritis of the knees with chronic pain Chronic bilateral knee pain with burning and popping sounds, exacerbated by walking and squatting. Minimal relief from previous radiofrequency ablation. - Increased Belbuca  to 450 mcg buccal film every 12 hours for one month. - Prescribed prednisone  taper for inflammation. - Encouraged walking and water therapy.  Lumbar spondylosis with facet arthropathy and chronic low back pain Chronic low back pain with L4-L5 hypertrophy and L5-S1 disc degeneration. Pain exacerbated by bending and certain activities. - Consider lumbar facet block for pain management.  Medication management: Patient's pain provide minimal pain relief with Belbuca , will continue on current medication regimen.  Prescribing drug monitoring (PMP) reviewed, findings consistent with the use of prescribed medication and no evidence of narcotic misuse or abuse.  Urine drug screening (UDS) up to date.   No side effects or any adverse reaction reported to medication.  Schedule follow-up in 1 month for medication management.   Plan: (Clinic): (B/L) L-FCT #  1 with (IV versed ) Dr. Marcelino       Heather Sweeney has a current medication list which includes the following long-term medication(s): albuterol , amlodipine , bupropion, famotidine , fluoxetine , fluticasone , furosemide , metoprolol  succinate, and omeprazole .  Pharmacotherapy (Medications Ordered): Meds ordered this encounter  Medications   Buprenorphine  HCl (BELBUCA ) 450 MCG FILM    Sig: Place 1 Film (450 mcg total) inside cheek every 12 (twelve) hours as needed.    Dispense:  60 each    Refill:  0   predniSONE  (DELTASONE ) 20 MG tablet    Sig: Take 3 tablets (60 mg total) by mouth daily with breakfast for 3 days, THEN 2 tablets (40 mg total) daily with breakfast for 3 days, THEN 1 tablet (20 mg total) daily with breakfast for 3 days.    Dispense:  18 tablet    Refill:  0   Orders:  Orders Placed This Encounter  Procedures   LUMBAR FACET(MEDIAL BRANCH NERVE BLOCK) MBNB    Diagnosis: Lumbar Facet Syndrome (M47.816); Lumbosacral Facet Syndrome (M47.817); Lumbar Facet Joint Pain (M54.59) Medical Necessity Statement: 1.Severe chronic axial low back pain causing functional impairment documented by ongoing pain scale assessments. 2.Pain present for longer than 3 months (Chronic) documented to have failed noninvasive conservative therapies. 3.Absence of untreated radiculopathy. 4.There is no radiological evidence of untreated fractures, tumor, infection, or deformity.  Physical Examination Findings: Positive Kemp Maneuver: (Y)  Positive Lumbar Hyperextension-Rotation provocative test: (Y)    Standing Status:   Future    Expiration Date:   01/10/2025    Scheduling Instructions:     Procedure: Lumbar facet Block     Type: Medial Branch Block     Side: Bilateral     Purpose: Diagnostic/Therapeutic     Level(s): L4-5 and L5-S1 Facets  (L4, L5, and S1 Medial Branch)     Sedation: With Sedation.     Timeframe: As soon as schedule allows.    Where will this procedure be performed?:   [facility]        Return in about 2 weeks (around 10/26/2024) for [facility]): (B/L) L-FCT # 1 with (IV versed ) Dr. Marcelino .    Recent Visits Date Type Provider Dept  08/22/24 Procedure visit Marcelino Nurse, MD [facility]  08/16/24 Office Visit Haruki Arnold K, NP [facility]  07/28/24 Office Visit Haifa Hatton K, NP [facility]  Showing recent visits within past 90 days and meeting all other requirements Today's Visits Date Type Provider Dept  10/12/24 Office Visit Jahshua Bonito K, NP [facility]  Showing today's visits and meeting all other requirements Future Appointments Date Type Provider Dept  10/26/24 Appointment Marcelino Nurse, MD [facility]  11/09/24 Appointment Jilene Spohr K, NP [facility]  Showing future appointments within next 90 days and meeting all other requirements  I discussed the assessment and treatment plan with the patient. The patient was provided an opportunity to ask questions and all were answered. The patient agreed with the plan and demonstrated an understanding of the instructions.  Patient advised to call back or seek an in-person evaluation if the symptoms or condition worsens.  I personally spent a total of 30 minutes in the care of the patient today including preparing to see the patient, getting/reviewing separately obtained history, performing a medically appropriate exam/evaluation, counseling and educating, placing orders, referring and communicating with other health care professionals, documenting clinical information in the EHR, independently interpreting results, communicating results, and coordinating care.  Note by: Shinika Estelle K Chi Woodham, NP (TTS and AI technology used. I apologize for any typographical errors that were not detected and  corrected.) Date: 10/12/2024; Time: 11:14 AM

## 2024-10-12 ENCOUNTER — Ambulatory Visit: Attending: Nurse Practitioner | Admitting: Nurse Practitioner

## 2024-10-12 ENCOUNTER — Encounter: Payer: Self-pay | Admitting: Nurse Practitioner

## 2024-10-12 ENCOUNTER — Ambulatory Visit (INDEPENDENT_AMBULATORY_CARE_PROVIDER_SITE_OTHER): Admitting: Nurse Practitioner

## 2024-10-12 VITALS — BP 140/68 | HR 79 | Temp 97.7°F | Resp 18 | Ht 63.0 in | Wt 250.0 lb

## 2024-10-12 DIAGNOSIS — M25562 Pain in left knee: Secondary | ICD-10-CM | POA: Diagnosis present

## 2024-10-12 DIAGNOSIS — G8929 Other chronic pain: Secondary | ICD-10-CM | POA: Insufficient documentation

## 2024-10-12 DIAGNOSIS — Z79899 Other long term (current) drug therapy: Secondary | ICD-10-CM | POA: Diagnosis present

## 2024-10-12 DIAGNOSIS — M47816 Spondylosis without myelopathy or radiculopathy, lumbar region: Secondary | ICD-10-CM | POA: Diagnosis present

## 2024-10-12 DIAGNOSIS — M25561 Pain in right knee: Secondary | ICD-10-CM | POA: Insufficient documentation

## 2024-10-12 DIAGNOSIS — M5416 Radiculopathy, lumbar region: Secondary | ICD-10-CM | POA: Diagnosis present

## 2024-10-12 DIAGNOSIS — G894 Chronic pain syndrome: Secondary | ICD-10-CM | POA: Insufficient documentation

## 2024-10-12 DIAGNOSIS — M170 Bilateral primary osteoarthritis of knee: Secondary | ICD-10-CM | POA: Diagnosis present

## 2024-10-12 MED ORDER — BELBUCA 450 MCG BU FILM: 1.0000 | ORAL_FILM | Freq: Two times a day (BID) | BUCCAL | 0 refills | Status: DC | PRN

## 2024-10-12 MED ORDER — PREDNISONE 20 MG PO TABS: ORAL_TABLET | ORAL | 0 refills | Status: AC

## 2024-10-12 NOTE — Patient Instructions (Addendum)
 Moderate Conscious Sedation, Adult, Care After After the procedure, it is common to have: Sleepiness for a few hours. Impaired judgment for a few hours. Trouble with balance. Nausea or vomiting if you eat too soon. Follow these instructions at home: For the time period you were told by your health care provider:  Rest. Do not participate in activities where you could fall or become injured. Do not drive or use machinery. Do not drink alcohol . Do not take sleeping pills or medicines that cause drowsiness. Do not make important decisions or sign legal documents. Do not take care of children on your own. Eating and drinking Follow instructions from your health care provider about what you may eat and drink. Drink enough fluid to keep your urine pale yellow. If you vomit: Drink clear fluids slowly and in small amounts as you are able. Clear fluids include water, ice chips, low-calorie sports drinks, and fruit juice that has water added to it (diluted fruit juice). Eat light and bland foods in small amounts as you are able. These foods include bananas, applesauce, rice, lean meats, toast, and crackers. General instructions Take over-the-counter and prescription medicines only as told by your health care provider. Have a responsible adult stay with you for the time you are told. Do not use any products that contain nicotine  or tobacco. These products include cigarettes, chewing tobacco, and vaping devices, such as e-cigarettes. If you need help quitting, ask your health care provider. Return to your normal activities as told by your health care provider. Ask your health care provider what activities are safe for you. Your health care provider may give you more instructions. Make sure you know what you can and cannot do. Contact a health care provider if: You are still sleepy or having trouble with balance after 24 hours. You feel light-headed. You vomit every time you eat or drink. You get  a rash. You have a fever. You have redness or swelling around the IV site. Get help right away if: You have trouble breathing. You start to feel confused at home. These symptoms may be an emergency. Get help right away. Call 911. Do not wait to see if the symptoms will go away. Do not drive yourself to the hospital. This information is not intended to replace advice given to you by your health care provider. Make sure you discuss any questions you have with your health care provider. Document Revised: 04/07/2022 Document Reviewed: 04/07/2022 Elsevier Patient Education  2024 Else[employer] Inc. Facet Joint Block The facet joints connect the bones of the spine (vertebrae). They let you bend, twist, and make other movements with your spine. They also keep you from bending too far, twisting too far, and making other extreme movements. A facet joint block is a procedure where a numbing medicine (local anesthesia) is injected into a facet joint. Many times, a medicine for inflammation (steroid) is also injected. A facet joint block may be done: To diagnose neck or back pain. If the pain gets better after a facet joint block, the pain is likely coming from the facet joint. If the pain does not get better, the pain is likely not coming from the facet joint. To treat neck or back pain caused by an inflamed facet joint. To help you with physical therapy or other rehab (rehabilitation) exercises. Tell a health care provider about: Any allergies you have. All medicines you are taking, including vitamins, herbs, eye drops, creams, and over-the-counter medicines. Any problems you or family members have  had with anesthesia. Any bleeding problems you have. Any surgeries you have had. Any medical conditions you have or have had. Whether you are pregnant or may be pregnant. What are the risks? Your health care provider will talk with you about risks. These may include: Infection. Allergic reactions to  medicines or dyes. Bleeding. Injury to a nerve near where the needle was put in (injection site). Pain at the injection site. Short-term weakness or numbness in areas near the nerves at the injection site. What happens before the procedure? When to stop eating and drinking Follow instructions from your health care provider about what you may eat and drink. Medicines Ask your health care provider about: Changing or stopping your regular medicines. These include any diabetes medicines or blood thinners you take. Taking medicines such as aspirin  and ibuprofen. These medicines can thin your blood. Do not take these medicines unless your health care provider tells you to. Taking over-the-counter medicines, vitamins, herbs, and supplements. General instructions If you will be going home right after the procedure, plan to have a responsible adult: Take you home from the hospital or clinic. You will not be allowed to drive. Care for you for the time you are told. Ask your health care provider: How your injection site will be marked. What steps will be taken to help prevent infection. These may include washing skin with a soap that kills germs. What happens during the procedure?  An IV will be inserted into one of your veins. You will lie on your stomach on an X-ray table. You may be asked to lie in a different position if you will be getting an injection in your neck. Your injection site will be cleaned with a soap that kills germs and then covered with a germ-free (sterile) drape. A local anesthesia will be put in at the injection site. A type of X-ray machine (fluoroscopy) or CT scan will be used to help find your facet joint. A contrast dye may also be injected into your joint to help show if the needle is at the joint. When your provider knows the needle is at your joint, they will inject anesthesia and anti-inflammatory medicine as needed. The needle will be removed. Pressure will be  applied to keep your injection site from bleeding. A bandage (dressing) will be placed over each injection site. The procedure may vary among health care providers and hospitals. What happens after the procedure? Your blood pressure, heart rate, breathing rate, and blood oxygen level will be monitored until you leave the hospital or clinic. This information is not intended to replace advice given to you by your health care provider. Make sure you discuss any questions you have with your health care provider. Document Revised: 04/04/2022 Document Reviewed: 04/04/2022 Elsevier Patient Education  2024 Else[employer] Inc. ______________________________________________________________________    Opioid Pain Medication Update  To: All patients taking opioid pain medications. (I.e.: hydrocodone , hydromorphone , oxycodone , oxymorphone, morphine, codeine , methadone, tapentadol, tramadol , buprenorphine , fentanyl , etc.)  Re: Updated review of side effects and adverse reactions of opioid analgesics, as well as new information about long term effects of this class of medications.  Direct risks of long-term opioid therapy are not limited to opioid addiction and overdose. Potential medical risks include serious fractures, breathing problems during sleep, hyperalgesia, immunosuppression, chronic constipation, bowel obstruction, myocardial infarction, and tooth decay secondary to xerostomia.  Unpredictable adverse effects that can occur even if you take your medication correctly: Cognitive impairment, respiratory depression, and death. Most people think that if  they take their medication correctly, and as instructed, that they will be safe. Nothing could be farther from the truth. In reality, a significant amount of recorded deaths associated with the use of opioids has occurred in individuals that had taken the medication for a long time, and were taking their medication correctly. The following are examples of how  this can happen: Patient taking his/her medication for a long time, as instructed, without any side effects, is given a certain antibiotic or another unrelated medication, which in turn triggers a Drug-to-drug interaction leading to disorientation, cognitive impairment, impaired reflexes, respiratory depression or an untoward event leading to serious bodily harm or injury, including death.  Patient taking his/her medication for a long time, as instructed, without any side effects, develops an acute impairment of liver and/or kidney function. This will lead to a rapid inability of the body to breakdown and eliminate their pain medication, which will result in effects similar to an overdose, but with the same medicine and dose that they had always taken. This again may lead to disorientation, cognitive impairment, impaired reflexes, respiratory depression or an untoward event leading to serious bodily harm or injury, including death.  A similar problem will occur with patients as they grow older and their liver and kidney function begins to decrease as part of the aging process.  Background information: Historically, the original case for using long-term opioid therapy to treat chronic noncancer pain was based on safety assumptions that subsequent experience has called into question. In 1996, the American Pain Society and the American Academy of Pain Medicine issued a consensus statement supporting long-term opioid therapy. This statement acknowledged the dangers of opioid prescribing but concluded that the risk for addiction was low; respiratory depression induced by opioids was short-lived, occurred mainly in opioid-naive patients, and was antagonized by pain; tolerance was not a common problem; and efforts to control diversion should not constrain opioid prescribing. This has now proven to be wrong. Experience regarding the risks for opioid addiction, misuse, and overdose in community practice has failed  to support these assumptions.  According to the Centers for Disease Control and Prevention, fatal overdoses involving opioid analgesics have increased sharply over the past decade. Currently, more than 96,700 people die from drug overdoses every year. Opioids are a factor in 7 out of every 10 overdose deaths. Deaths from drug overdose have surpassed motor vehicle accidents as the leading cause of death for individuals between the ages of 1 and 54.  Clinical data suggest that neuroendocrine dysfunction may be very common in both men and women, potentially causing hypogonadism, erectile dysfunction, infertility, decreased libido, osteoporosis, and depression. Recent studies linked higher opioid dose to increased opioid-related mortality. Controlled observational studies reported that long-term opioid therapy may be associated with increased risk for cardiovascular events. Subsequent meta-analysis concluded that the safety of long-term opioid therapy in elderly patients has not been proven.   Side Effects and adverse reactions: Common side effects: Drowsiness (sedation). Dizziness. Nausea and vomiting. Constipation. Physical dependence -- Dependence often manifests with withdrawal symptoms when opioids are discontinued or decreased. Tolerance -- As you take repeated doses of opioids, you require increased medication to experience the same effect of pain relief. Respiratory depression -- This can occur in healthy people, especially with higher doses. However, people with COPD, asthma or other lung conditions may be even more susceptible to fatal respiratory impairment.  Uncommon side effects: An increased sensitivity to feeling pain and extreme response to pain (hyperalgesia). Chronic use of opioids  can lead to this. Delayed gastric emptying (the process by which the contents of your stomach are moved into your small intestine). Muscle rigidity. Immune system and hormonal dysfunction. Quick,  involuntary muscle jerks (myoclonus). Arrhythmia. Itchy skin (pruritus). Dry mouth (xerostomia).  Long-term side effects: Chronic constipation. Sleep-disordered breathing (SDB). Increased risk of bone fractures. Hypothalamic-pituitary-adrenal dysregulation. Increased risk of overdose.  RISKS: Respiratory depression and death: Opioids increase the risk of respiratory depression and death.  Drug-to-drug interactions: Opioids are relatively contraindicated in combination with benzodiazepines, sleep inducers, and other central nervous system depressants. Other classes of medications (i.e.: certain antibiotics and even over-the-counter medications) may also trigger or induce respiratory depression in some patients.  Medical conditions: Patients with pre-existing respiratory problems are at higher risk of respiratory failure and/or depression when in combination with opioid analgesics. Opioids are relatively contraindicated in some medical conditions such as central sleep apnea.   Fractures and Falls:  Opioids increase the risk and incidence of falls. This is of particular importance in elderly patients.  Endocrine System:  Long-term administration is associated with endocrine abnormalities (endocrinopathies). (Also known as Opioid-induced Endocrinopathy) Influences on both the hypothalamic-pituitary-adrenal axis?and the hypothalamic-pituitary-gonadal axis have been demonstrated with consequent hypogonadism and adrenal insufficiency in both sexes. Hypogonadism and decreased levels of dehydroepiandrosterone sulfate have been reported in men and women. Endocrine effects include: Amenorrhoea in women (abnormal absence of menstruation) Reduced libido in both sexes Decreased sexual function Erectile dysfunction in men Hypogonadisms (decreased testicular function with shrinkage of testicles) Infertility Depression and fatigue Loss of muscle mass Anxiety Depression Immune  suppression Hyperalgesia Weight gain Anemia Osteoporosis Patients (particularly women of childbearing age) should avoid opioids. There is insufficient evidence to recommend routine monitoring of asymptomatic patients taking opioids in the long-term for hormonal deficiencies.  Immune System: Human studies have demonstrated that opioids have an immunomodulating effect. These effects are mediated via opioid receptors both on immune effector cells and in the central nervous system. Opioids have been demonstrated to have adverse effects on antimicrobial response and anti-tumour surveillance. Buprenorphine  has been demonstrated to have no impact on immune function.  Opioid Induced Hyperalgesia: Human studies have demonstrated that prolonged use of opioids can lead to a state of abnormal pain sensitivity, sometimes called opioid induced hyperalgesia (OIH). Opioid induced hyperalgesia is not usually seen in the absence of tolerance to opioid analgesia. Clinically, hyperalgesia may be diagnosed if the patient on long-term opioid therapy presents with increased pain. This might be qualitatively and anatomically distinct from pain related to disease progression or to breakthrough pain resulting from development of opioid tolerance. Pain associated with hyperalgesia tends to be more diffuse than the pre-existing pain and less defined in quality. Management of opioid induced hyperalgesia requires opioid dose reduction.  Cancer: Chronic opioid therapy has been associated with an increased risk of cancer among noncancer patients with chronic pain. This association was more evident in chronic strong opioid users. Chronic opioid consumption causes significant pathological changes in the small intestine and colon. Epidemiological studies have found that there is a link between opium dependence and initiation of gastrointestinal cancers. Cancer is the second leading cause of death after cardiovascular disease.  Chronic use of opioids can cause multiple conditions such as GERD, immunosuppression and renal damage as well as carcinogenic effects, which are associated with the incidence of cancers.   Mortality: Long-term opioid use has been associated with increased mortality among patients with chronic non-cancer pain (CNCP).  Prescription of long-acting opioids for chronic noncancer pain was associated with a  significantly increased risk of all-cause mortality, including deaths from causes other than overdose.  Reference: Von Korff M, Kolodny A, Deyo RA, Chou R. Long-term opioid therapy reconsidered. Ann Intern Med. 2011 Sep 6;155(5):325-8. doi: 10.7326/[id number]. PMID: 78106373; PMCID: [id number]. Kit JINNY Laurence CINDERELLA Pearley JINNY, Hayward RA, Dunn KM, Jordan KP. Risk of adverse events in patients prescribed long-term opioids: A cohort study in the [place] Clinical Practice Research [employer]. Eur J Pain. 2019 May;23(5):908-922. doi: 10.1002/ejp.1357. Epub 2019 Jan 31. PMID: 69379883. Colameco S, Coren JS, Ciervo CA. Continuous opioid treatment for chronic noncancer pain: a time for moderation in prescribing. Postgrad Med. 2009 Jul;121(4):61-6. doi: 10.3810/pgm.2009.07.2032. PMID: 80358728. Gigi JONELLE Shlomo MILUS Levern IVER Conny RN, [place] SD, Blazina I, Lonell DASEN, Bougatsos C, Deyo RA. The effectiveness and risks of long-term opioid therapy for chronic pain: a systematic review for a [employer] of [employer]ways to [employer]. Ann Intern Med. 2015 Feb 17;162(4):276-86. doi: 10.7326/M14-2559. PMID: 74418742. Rory CHRISTELLA Laurence [facility], [place] DM. NCHS Data Brief No. 22. [place]: Centers for Disease Control and Prevention; 2009. Sep, Increase in Fatal Poisonings Involving Opioid Analgesics in the [place] , 1999-2006. Song IA, Choi HR, Oh TK. Long-term opioid use and mortality in patients with chronic non-cancer pain: Ten-year follow-up study in [place] from 2010 through 2019.  EClinicalMedicine. 2022 Jul 18;51:101558. doi: 10.1016/j.eclinm.2022.898441. PMID: 64124182; PMCID: [id number]. Huser, W., Schubert, T., Vogelmann, T. et al. All-cause mortality in patients with long-term opioid therapy compared with non-opioid analgesics for chronic non-cancer pain: a database study. BMC Med 18, 162 (2020). http://lester.info/ Rashidian H, Zendehdel K, Kamangar F, Malekzadeh R, Haghdoost AA. An Ecological Study of the Association between Opiate Use and Incidence of Cancers. Addict Health. 2016 Fall;8(4):252-260. PMID: 71180443; PMCID: [id number].  Our Goal: Our goal is to control your pain with means other than the use of opioid pain medications.  Our Recommendation: Talk to your physician about coming off of these medications. We can assist you with the tapering down and stopping these medicines. Based on the new information, even if you cannot completely stop the medication, a decrease in the dose may be associated with a lesser risk. Ask for other means of controlling the pain. Decrease or eliminate those factors that significantly contribute to your pain such as smoking, obesity, and a diet heavily tilted towards inflammatory nutrients.  Last Updated: 04/13/2023   ______________________________________________________________________       ______________________________________________________________________    Update on Controlled Substance (Opioid) Regulations   To: All patients taking opioid pain medications. (I.e.: hydrocodone , hydromorphone , oxycodone , oxymorphone, morphine, codeine , methadone, tapentadol, tramadol , buprenorphine , fentanyl , etc.)  Re: Review on the state of controlled substance regulations.  Introduction: Rules and regulations associated with all aspects of controlled substances are constantly being modified. Unfortunately we have encountered patients questioning the veracity of the information that we provide them about  these changes. This is intended to provide them with appropriate references and a historical review of these changes.  A Brief History: As of July 21, 2016, the [place]  Government declared the opioid epidemic a public health emergency. Prescription drug monitoring programs (PDMPs) and the [facility] All Schedules Prescription Electronic Reporting Act (NASPER). Before 1800, clinicians regarded pain as an existential phenomenon, a consequence of aging. There was no regulation on the use of cocaine and opioids, resulting in widespread marketing and prescribing for many ailments ranging from diarrhea to toothache. The [employer] of [phone], passed in response to the sudden emergence of street heroin abuse as well as iatrogenic morphine  dependence, influenced both physician and patient alike to avoid opiates. Patients with unexplained pain in the 1920s were regarded as deluded, malingering, or abusers, and cancer patients through the 1950s were encouraged to wean themselves off opioids until their lives could be measured in weeks. Alongside this opioid evolution, the American Pain Society launched their influential pain as the fifth vital sign campaign in 10-22-93. Concurrently, pharmaceutical companies introduced new formulations, such as extended release oxycodone  (OxyContin ). From 10-23-95 to 2000/10/22, OxyContin  prescriptions increased from 670,000 to 6.2 million. However, concerns soon began to surface regarding overzealous opioid treatment. It must be noted that pharmaceutical companies contributed significantly to the rise of the opioid epidemic, receiving considerable reprimands as a consequence. In 22-Oct-2005, as the opioid epidemic began to inflict profound damage, [employer] pleaded guilty to federal charges related to the misbranding of OxyContin . [employer] agreed to pay a total of $634.5 million to resolve Justice Department investigations, as well as a $19.5 million settlement to [address] and the  [address] of [place].  In response to the current epidemic, changes in focus to the development of new abuse deterrent opioid formulations at the [place]  Food and Drug Administration (FDA) as well as drafting of new public standards for pain treatment were created at [facility] in 2015/10/23. In response to the opioid epidemic, FDA public policy changes were announced in February 2016. Among these new positions were a re-examination of the risk-benefit paradigm for opioids with strict emphasis on the large public health ramifications. The various modified opioids released over the past 20 years, such as tamper-resistant preparation, have had differing levels of success, and are collectively referred to as Risk Evaluation and Mitigation Strategies (REMS). There is also a growing focus on preventing opioid use disorder (OUD) and on offering affected individuals accessible and effective treatment. [place]  government policy reflects these changes and both the Affordable Care Act and the Mental Health Parity and Addiction Equity Act were major steps forward in treating opioid addiction. The Affordable Care Act, which was signed into law in 10-22-08, with major provisions coming into effect by Oct 22, 2012.  In the 1990s, the intensified marketing of newly reformulated prescription opioid medications (e.g., OxyContin ) and an influential pain advocacy campaign that encouraged greater pain management led to a precipitous rise in opioid use in the [place] . Research from the Centers for Disease Control and Prevention (CDC) shows that prescription opioid sales in the [place]  quadrupled from 1997/10/22 to 10/22/2008. At the same time, opioid misuse and opioid-involved overdose deaths increased (Figure 1). Between 1997-10-22 and 10-22-2008, the rate of opioidinvolved overdose deaths in the [place]  doubled from 2.9 to 6.8 deaths per 100,000 people. This initial rise in opioid-related deaths is often referred to as the first wave of the recent opioid  crisis.  Between October 22, 1997 and 10/22/2018, 565,000 Americans died of opioid-involved overdoses. In turn, federal, state, and local governments responded with various legal and policy efforts to curb opioid misuse and drug-related overdose Deaths.  Recent Congresses have enacted several laws addressing the opioid crisis, such as the Comprehensive Addiction and Recovery Act of 10/22/2014 (CARA, P.L. [phone]); the 21st Century Cures Act (P.L. 114-255); the Substance UseDisorder Prevention that Promotes Opioid Recovery and Treatment for Patients and Communities Act (SUPPORT Act, P.L. [phone]); the Fentanyl  Sanctions Act (Title LXXII of P.L. [id number]); and the Blocking Deadly Fentanyl  Imports Act (P.L. 117-81, 6610). These laws addressed overprescribing and misuse of opioids, expanded substance use disorder prevention and treatment capacities, bolstered drug diversion capabilities, and enhanced international  drug interdiction, counternarcotics cooperation, and sanctions efforts. Congress also directed additional funds to many of these initiatives through appropriations.  Congress provided funding in the U.[employer]. [employer] Act of 2021 ([phone]; P.L. 117-2) for syringe services programs (often known as needle exchange programs) and other harm reduction initiatives. Federal and state harm reduction strategies have frequently involved the distribution of naloxone (e.g., Narcan)--a medication used to reverse an opioid overdose--and test strips used to detect fentanyl  in drug samples.  The Department of Justice (DOJ) and Department of Homeland Security [facility]) aim to reduce the diversion of prescription opioids and the use, manufacturing, and trafficking of illicit opioids. DOJ--via the Drug Enforcement Administration (DEA)--regulates opioid manufacturers, distributors, and dispensers; it also controls the opioid supply through enforcement of regulatory requirements.  A History of Opiate Laws in the [place]  [place]  Prior to 1890, laws concerning opiates were strictly imposed on a local city or state-by-state basis. One of the first was in [place] in 1875 where it became illegal to smoke opium only in opium dens. It did not ban the sale, import or use otherwise. In the next 25 years different states enacted opium laws ranging from outlawing opium dens altogether to making possession of opium, morphine and heroin without a physicians prescription illegal.  The first Congressional Act took place in 1890 that levied taxes on morphine and opium. From that time on the [employer] has had a series of laws and acts directly aimed at opiate use, abuse and control. These are outlined below:  1906 - Pure Food and Drug Act Preventing the manufacture, sale, or transportation of adulterated or misbranded or poisonous or deleterious foods, drugs, medicines, and liquors, and for regulating traffic therein, and for other purposes. Punishment included fines and prison time.  1909   - Smoking Opium Exclusion Act Banned the importation, possession and use of smoking opium. Did not regulate opium-based medications. First Freight forwarder banning the non-medical use of a substance.  1914  - The Margrette Act In summary, The Margrette Act of 1914 was written more to have all parties involved in importing, exporting, set designer and distributing opium or cocaine to register with the [employer] and have taxes levied upon them. Exempt from the law were physicians operating in the course of his professional practice  1919 - Supreme Court ratified the Bj's in [place] v. [place]  and [place]  v. [place], then again in [facility] v. [place] , in 1920, holding that doctors may not prescribe maintenance supplies of narcotics to people addicted to narcotics. However, it does not prohibit doctors from prescribing narcotics to wean a patient off of the drug. It was also  the opinion of the court that prescribing narcotics to habitual users was not considered professional practice hence it then was considered illegal for doctors to prescribe opioids for the purposes of maintaining an addiction. It can be argued that todays addiction medications are not intended to maintain an addiction but to facilitate addiction remission. In which case, this opinion of the court should not preclude practitioners from prescribing buprenorphine  or methadone to patients suffering from an addictive disorder.  1924  - Heroin Act Architectural technologist, importation and possession of heroin illegal - even for medicinal use.  1922 -- Narcotic Drug Import and Export Act Enacted to assure proper control of importation, sale, possession, production and consumption of narcotics.  1927  -- Special Educational Needs Teacher of Prohibition [employer] of Prohibition was responsible for tracking bootleggers and  organized conservation officer, historic buildings. They focused primarily on interstate and international cases and those cases where local law enforcement official would not or could not act.  1932 -- Uniform State Narcotic Act Encouraged states to pass uniform state laws matching the federal Narcotic Drug Import and Export Act. Suggested prohibiting cannabis use at the state level.  71 -- Food, Drug, and Cosmetic Act The new law brought cosmetics and medical devices under control, and it required that drugs be labeled with adequate directions for safe use. Moreover, it mandated pre-market approval of all new drugs, such that a manufacturer would have to prove to FDA that a drug were safe before it could be sold  1951 -- Boggs Act Imposed maximum criminal penalties for violations of the import/export and internal revenue laws related to drugs and also established mandatory minimum prison sentences.  1956 -- Narcotics Control Act Increased Boggs Act penalties and mandatory prison sentence minimums for violations of existing drug  laws.  1965 -- Drug Abuse Control Amendment Enacted to deal with problems caused by abuse of depressants, stimulants and hallucinogens. Restricted research into psychoactive drugs such as LSD by requiring FDA approval.  1970 -- Controlled Substance Act  Controlled Substances Import and Export Act These laws are a consolidation of numerous laws regulating the manufacture and distribution of narcotics, stimulants, depressants, hallucinogens, anabolic steroids, and chemicals used in the illicit production of controlled substances. The CSA places all substances that are regulated under existing federal law into one of five schedules. This placement is based upon the substance's medicinal value, harmfulness, and potential for abuse or addiction. Schedule I is reserved for the most dangerous drugs that have no recognized medical use, while Schedule V is the classification used for the least dangerous drugs. The act also provides a mechanism for substances to be controlled, added to a schedule, decontrolled, removed from control, rescheduled, or transferred from one schedule to another.  21 - Drug Enforcement Agency By Executive Order, the DEA was formed to take place of the [employer] of Narcotics and Dangerous Drugs.  10 -Narcotic Addict Treatment Act of  1974  - Public Law [phone] Amends the Controlled Substance Act of 1970 to provide for the registration of practitioners conducting narcotic treatment programs. [methadone clinics] It also provides legal definitions for the phrases maintenance treatment and detoxification treatment.  1986 -- Anti-Drug Abuse Act of 1986 Strengthened Federal efforts to encourage foreign cooperation in eradicating illicit drug crops and in halting international drug traffic, to improve enforcement of Federal drug laws and enhance interdiction of illicit drug shipments, to provide strong Market researcher in establishing effective drug abuse prevention and education  programs, to [employer] support for drug abuse treatment and rehabilitation efforts, and for other purposes. It also re-imposed mandatory sentencing minimums depending on which drug and how much was involved.  1988 -- Anti-Drug Abuse Act of 1988 Established the Office of Materials Engineer (ONDCP) in the [employer] of the Economist; authorized funds for [employer], state and local drug enforcement activities, school-based drug prevention efforts, and drug abuse treatment with special emphasis on injecting drug abusers at high risk for AIDS.  2000 -- Federal - The Drug Addiction Treatment Act of 2000 (DATA 2000) It enables qualified physicians to prescribe and/or dispense narcotics for the purpose of treating opioid dependency. For the first time, physicians are able to treat this disease from their private offices or other clinical settings. This presents a very desirable treatment option for those who are unwilling or  unable to seek help in drug treatment clinics. Patients can now be treated in the privacy of their doctors office, as are other people being treated for any other type of medical condition. One medicine doctors may now prescribe is Buprenorphine . The major downfall of this Act is the limitation of 30 patients per practice - which means that large facilities, no matter how many physicians are there, can only treat 30 patients at a time.  2002-- DEA reschedules buprenorphine  from a schedule V drug to a schedule III drug, on July 12, 2001 - the day before the FDA approval of Suboxone and Subutex despite overwhelming objection by the medical community.  2004: June 2004 THE CONFIDENTIALITY OF ALCOHOL  AND DRUG ABUSE PATIENT RECORDS REGULATION AND THE HIPAA PRIVACY RULE:  Confidentiality of Alcohol  and Drug Dependence Patient Records (summary) Code of Federal Regulations Title 42 Part 2 (42 CFR Part 2)  The confidentiality of alcohol  and drug dependence patient records  maintained by this practice/program is protected by federal law and regulations. Generally, the practice/program may not say to a person outside the practice/program that a patient attends the practice/program, or disclose any information identifying a patient as being alcohol  or drug dependent unless:  The patient consents in writing; The disclosure is allowed by a court order, or The disclosure is made to medical personnel in a medical emergency or to qualified personnel for research,  audit, or practice/program evaluation. Violation of the federal law and regulations by a practice/program is a crime. Suspected violations may be reported to appropriate authorities in accordance with federal regulations. Freight forwarder and regulations do not protect any information about a crime committed by a patient either at the practice/program or against any person who works for the practice/program or about any threat to commit such a crime. Federal laws and regulations do not protect any information about suspected child abuse or neglect from being reported under state law to appropriate state or local authorities.  sample consent form (MS-WORD)  2005: 05-07-2004 Public law [phone], Amends the Controlled Substances Act to eliminate the 30-patient limit for medical group practices allowed to dispense narcotic drugs in schedules III, IV, or V for maintenance or detoxification treatment (retains the 30-patient limit for an individual physician). This amendment removes the 30-patient limit on group medical practices that treat opioid dependence with buprenorphine . The restriction was part of the original Drug Addiction Treatment Act of 2000 (DATA) that allowed treatment of opioid dependence in a doctor's office. With this change, every certified doctor may now prescribe buprenorphine  up to his or her individual physician limit of 30 patients.  2006: On 10/03/2005 President Levy signed Bill H.R.6344 into law. This allows  physicians who have been certified to prescribe certain drugs for the treatment of opioid dependence under DATA2000 to treat up to 100 patients (up from 30) by submitting an intent notification to the Dept of Health and [employer]. This is a major step forward in both fighting the stigma and allowing access to treatment previously not available to some. For more details see 30/100-PATIENT LIMIT  2016: HHS augments regulations concerning the 30/100 patient limit by raising the limit to 275 for qualifying physicians. Link to summary of regulation  2016: Comprehensive Addiction and Recovery Act of 2016 (sec.303) amends the Controlled Substance Act - to allow Nurse Practitioners and Physician Assistants to become eligible to prescribe buprenorphine  for the treatment of opioid use disorder. See the entire law for more details.  The roots of the concurrent regulation  of certain drugs under two statutory schemes go back to the beginning of this century. In 1906, Congress enacted the Pure Food and Drug Act, establishing one regime of regulation to assure (among other things) that drugs were not adulterated or misbranded. These regulations were amended several times, recodified in 1938, and expanded on again from the 1940s through the 1990s. Their implementation and enforcement is today assigned to the Food and Drug Administration (FDA) in the Department of Health and Human Services [facility]).  In 1914, Congress adopted the [place] Narcotic Act to stop abuse of addictive drugs. The Margrette Narcotic Act was amended in 1937 to include marijuana. In 1965, amphetamines, barbiturates, and hallucinogens came under regulation, but under the [employer], Drug, and Cosmetic Act. In 1970, these various statutes were consolidated and recodified as the Controlled Substances Act (CSA), which has been amended several times since then. Its implementation and enforcement is today assigned to the Drug Enforcement Administration  (DEA) in the Department of Justice.  The first clash occurred after World War I, when so-called morphine clinics existed and physicians prescribed or dispensed morphine to addicts. Some addicts were veterans of the American Civil War, the Spanish-American War, and WWI, who had become addicted during treatment for war wounds, but most of them came from the growing population of nonmedical addicts ([place], 8017). The Narcotics Division of the Prohibition Unit of the Department of the Treasury, which was then responsible for enforcing the [facility] Narcotic Act, concluded that this activity was not the legitimate practice of medicine but simple drug trafficking. The Treasury Department swiftly closed the clinics and made it personally and professionally risky for physicians to maintain a narcotic addict for any reason. In did so, however, only after the American Medical Association had adopted a resolution, in 1920, opposing ambulatory clinics''.  In 1972, the public health establishment, including the Secretary of Health, Education, and Welfare, the Education Officer, Environmental, the [employer] of [employer], and the Chemical Engineer for Drug Abuse Prevention, was unprepared to allow [employer] of Narcotics and Dangerous Drugs, DEA's predecessor agency, to unilaterally define the parameters of medical practice for the use of methadone in the treatment of heroin addiction. As a consequence, a new set of rules--the third, on top of the FDA and DEA schemes--was added, one that inserted FDA deeply into the practice of medicine, notwithstanding its protestations to the contrary. Congress ratified this joint responsibility of law enforcement and public health officials for methadone through this third set of rules in 1974 with the passage of the Narcotic Addict Treatment Act (NATA). To examine in detail the evolution of this third set of rules--commonly referred to as the FDA or DHHS methadone  regulations--we turn, first, to the period of the mid-1960s.  Increased use of heroin in the post World War II period first became apparent in the early to mid 1950s. During the [employer], a minimum mandatory narcotics law was enacted in 1956, effective July 1957. 1962 [facility] conference on drug abuse, the [employer] on Narcotic and Drug Abuse (the [employer]) of 1963, the Drug Abuse Control Amendments of 1965, the President's Commission on [employer] and Administration of Justice (the [employer]) of [phone], and the Narcotic Addiction Rehabilitation Act of 1966.  The 1965 Drug Abuse Control Amendments brought under strict federal control all nonnarcotic drugs capable of producing serious psychotoxic effects when abused. This act also created the [employer] of Drug Abuse Control within the Department of Health,  Education, and Welfare SPORT AND EXERCISE PSYCHOLOGIST) and shifted the basis for scientist, water quality of illegal drugs from tax principles (administered by the Department of Treasury) to the regulation of commerce (administered by the [employer]).  The 1966 Narcotic Addiction Rehabilitation Act TOUR MANAGER) authorized the civil commitment of narcotic addicts, and federal assistance to state and local governments to develop a local system of drug treatment programs. With respect to the latter, the [employer] of Mental Health [facility]) initially proposed the gradual implementation of the state assistance effort, mainly through a common mental health mechanism--inpatient treatment programs. However, because of a perceived pressing need, the courts began to commit addicts to these programs even before they were officially opened or staffed. The NARA legislation imposed the following contract requirements on treatment centers: (1) thrice-a-week counseling sessions; (2) weekly urine tests; (3) restorative dental services; (4) psychological consultations and vocational  training; and (5) the treatment modalities of drug-free outpatient, therapeutic community, and methadone maintenance. Reorganization Plan No. 1 of 1968 transferred the primary functions of the [employer] of Narcotics (FBN) from the [employer] to the Department of Justice; it also transferred the [employer] of Drug Abuse Control functions to the Department of Justice. Within the [employer], the [employer] of Narcotics and Dangerous Drugs (BNDD) was created, which became the Drug Enforcement Administration in 1973.   Under the first Chittenden administration [phone]), federal drug abuse policy developed in a significant way. These developments included a 1969 war on drugs presidential message, resulting legislation in 1970, and a Special Action Office created by executive order in 1971 and authorized in statute in 1972. Brynn, in 1969, to send a message to Congress on drug abuse. Although this was the first time that a U.S. president invoked the war on drugs image, it was in retrospect the most balanced approach to the problem of drug abuse that had been advanced. The 1969 message resulted in the submission of legislation to the Congress and the passage, the following year, of the Comprehensive Drug Abuse Prevention and Control Act of 1970 [employer] [phone], August 01, 1969). The act dealt with research, treatment, and prevention of drug abuse and drug dependence, and with drug abuse charity fundraiser. One major purpose of the 1970 legislation was to reverse some of the strictures of the [employer] of 1914. The 1970 act sought to clarify for the medical profession . . . the extent to which they may safely go in treating narcotic addicts as patients. Title I, in Section IV, charged the Surveyor, Minerals, Education, and Welfare, to determine the appropriate methods of professional practice in the medical treatment of the narcotic addiction of various classes of narcotic addicts.  This provision constitutes the initial statutory basis for treatment standards. The law enforcement sections consolidated all prior federal statutes into the Controlled Substances Act and the Controlled Substances Export and Import Act (Titles II and III, respectively, of the Comprehensive Drug Abuse Prevention and Control Act of 1970). Under this legislation, substances were classified under five schedules according to their abuse potential, and psychological and physical effects. Methadone was placed in Schedule II, along with such opiate drugs as morphine, codeine , and hydrocodone .  One of the most important steps taken by President Brynn was to establish in June 1971 the Special Action Office for Drug Abuse Prevention (SAODAP) in the [employer] of the President ([place] [phone], March 22, 1970). In mid-1971, [facility] appointed Dr. Maple Dunnings as SAODAP director. Within a year, the Drug Abuse Prevention  Office and Treatment Act of 1972 [employer] [phone], December 25, 1970) gave statutory authority to [facility], but limiting setting, on April 04, 1974, as the limit on its existence.  The purpose of the 1972 act was to bring the resources of the federal government to bear on drug abuse with the immediate objective of significantly reducing its incidence and developing a comprehensive, coordinated long-term federal strategy to combat drug abuse.  Narcotic Addict Treatment Act [employer]) of 1974 [employer] [phone]), which amended the Controlled Substances Act. This legislation was driven by concern for the diversion of methadone to illicit channels that was occurring in 1972 and 1973, as reflected in the title of the Senate bill adopted on March 13, 1972, the Methadone Diversion Control Act of 1973. (U.S. Senate, 1970a, 8029a).  The 1980 final rule (45 FR 37305, June 25, 1979) reduced the minimum standard for admission from two years of addiction to one year coupled with a clinical determination that  the individual was currently physiologically.  The regulations were next revised in 1989, following two proposals to modify them, one in 1983 and one in 1987.  Under President Tanda Corrente, a government-wide effort was made to review all federal government regulations and to eliminate or reduce the burden of these regulations on the private sector, state and nash-finch company, and wps resources.   The 1983 recommendations, though not adopted, did initiate another revision of the methadone regulations, which first found expression in a 1987 proposed rule (52 FR 37047, July 07, 1986) and culminated in a final rule (54 FR 8954, December 06, 1987) at the end of the decade. In the 1987 proposed rule, the FDA and NIDA, in an effort to put the best face on the unenthusiastic 1983 response by the provider community to converting the regulations to guidelines, indicated that they had retained the current requirements necessary to achieve the goals of the 1974 NATA, but were proposing to streamline the regulation and to promote more efficient operation of methadone programs. The 1987 proposed rule, issued by the FDA and NIDA, advanced the following changes in the methadone regulation: that detoxification treatment be divided into short-term (<21 days) and long-term (>21 and <180 days) treatment; that the minimum staffing ratio of one counselor to 50 patients be eliminated; that blood tests be allowed as ways to conduct initial drug screening or to meet the monthly testing requirements for six-day take-home patients; that the 72-hour notification of FDA and the pertinent state authority for methadone doses greater than 100 mg be eliminated; that special adverse reaction reporting requirements for methadone be eliminated and reliance placed upon general FDA reporting requirements; that a supervising counselor be allowed to conduct the annual review of the patient's treatment plan for certain qualified  patients who had been in treatment for 3 years or longer; and that the requirement of an annual report of methadone treatment programs to the FDA be dropped. The FDA and NIDA issued a final rule on December 06, 1987, based on comments on the 1987 proposed (54 FR 8954). Concurrently, FDA and NIDA issued a six-page guidance document, which noted that the regulations, over time, had recommended certain practices that were not actually required. Public Health Service, in Congress, and elsewhere, to reorganize the Alcohol , Drug Abuse, and Mental Health Administration (ADAMHA). These efforts culminated in the [employer] of 1992 [employer] [phone], April 15, 1991), the main purpose of which was to transfer the research portions of the three ADAMHA institutes--NIDA, the [employer]  of Alcoholism and Alcohol  Abuse, and the [employer] of Mental Health--to the [employer] and to create the Substance Abuse and Mental Health Services Administration [facility]) as the home for the service functions of these entitles.  Guidelines for Opioid Treatment The Federal Guidelines for Opioid Treatment Programs - 2015 serve as a guide to accrediting organizations for developing accreditation standards. The guidelines also provide OTPs with information on how programs can achieve and maintain compliance with federal regulations. The 2015 guidelines are an update to the 2007 Guidelines for the Accreditation of Opioid Treatment Programs (PDF  547 KB). The new document reflects the obligation of OTPs to deliver care consistent with the patient-centered, integrated, and recovery-oriented standards of substance use treatment.  DPT oversees the certification of OTPs and provides guidance to nonprofit organizations and state governmental entities that want to become a SAMHSA-approved accrediting body. Learn more about the accreditation and certification of OTPs and [facility] oversight of OTP  accreditation bodies.  Model Guidelines for [employer] With input from [facility], the Federation of [employer] in 2013 adopted a revised version of the federations office-based opioid treatment policies. The Model Policy on DATA 2000 and Treatment of Opioid Addiction in the Medical Office - 2013 (PDF  279 KB) provides model guidelines for use by state medical boards in regulating office-based opioid treatment.  Holiday Guidance for Opioid Treatment Programs (PDF  203 KB) In response to requests for the upcoming federal holidays and ensuing weekends (December 24th, 25th, and 26th and December 31st, Jan 1st, and Jan 2nd), this letter is to provide guidance regarding requests for unsupervised doses of medication for patients for these dates. View a sample [id number]-168 (PDF  194 KB).  Federal regulation of drugs emerged as early as 58, under a law that addressed only imported drugs. In 1905 the [employer] launched a private, voluntary means of controlling a substantial part of the drug marketplace, a system that remained in place for over a half-century. Drug regulation in FDA has evolved considerably since President Ricardo Para signed the 1906 Pure Food and Drugs Act.  1820 Eleven doctors set up the U.S. Pharmacopeia and record the first list of standard drugs. 1848 Drug Importation Act passed by Congress requires U.S. Customs Service inspection to stop entry of tainted, low quality drugs from overseas. 8116 Dr. Mitchell MICAEL Burrs becomes the chief chemist at the [facility] of [employer] adulteration studies.  1905 The American Medical Association [facility]) begins a voluntary program of drug approval that would last until 1955. In order to advertise in the [facility] and related journals, drug companies must show proof that the drug will treat what they claim. 1906 The original Food and Drug Act is passed by Congress on June 30 and signed by [employer]. The Act outlaws states from buying and selling food, drinks, and drugs that have been mislabeled and tainted. 1911 In U.S. v. Vicci, the [employer] that the [employer] and Drugs Act does not outlaw false medical claims but only false and misleading statements about the ingredients or identity of a drug. 1912 Congress passes the [place] Amendment to overcome the ruling in U.S. v. Vicci. The Act outlaws labeling medicines with fake medical claims that is meant to trick the buyer. 1930 The name of the Food, Drug, and Insecticide Administration is shortened to Food and Drug Administration (FDA) under an therapist, music. 1933 FDA recommends a total rewrite of the out-of-date 1906 Food and  Drugs Act.   1937 Elixir Sulfanilamide, contain the poisonous liquid, diethylene glycol, kills 107 persons, many of whom are children, dramatizing the need to establish drug safety before marketing and to pass the pending food and drug law. 1938 Congress passes [employer], Drug, and Cosmetic (FDC) Act of 1938, which requires that new drugs show safety before selling. This starts a new system of drug regulation. The Act also requires that safe limits be set for unavoidable poisonous matter and allows for factory inspections. The Directv is given power to oversee advertising for all FDAregulated products except prescription drugs. FDA states that sulfanilamide and other dangerous drugs must be given under the direction of a medical expert. This begins the requirement for prescription only (nonnarcotic) drugs (see 1951 [place] amendment). 1941 Nearly 300 deaths and injuries result from the use of sulfathiazole tablets, an antibiotic, tainted with the sedative, phenobarbital. In response, FDA drastically changes manufacturing and quality controls. These changes lead to the development of good manufacturing practices (GMPs). 1948 The  [employer] in U.S. v. Floretta that FDA jurisdiction extends to retail stores, thereby allowing FDA to stop illegal sales of drugs by pharmacies including barbiturates and amphetamines. 1950 In [employer]. v. U.S., a U.S. Court of Appeals rules that the directions for use on a drug label must include the drugs purpose. 1951 Congress passes the [place]-Humphrey Amendment, which defines the kinds of drugs that cannot be used safely without medical supervision. The amendment limits sale of these drugs to prescription only by a medical professional. All other drugs are to be available without a prescription. 1952 A nationwide investigation by FDA reveals that chloramphenicol, an antibiotic, caused nearly 180 cases of often deadly blood diseases. Two years later FDA engages the [employer] of Hospital Pharmacists, the American Association of Medical Record Librarians, and later the American Medical Association in a voluntary program of drug reaction reporting. 1953 The [employer] Amendment clarifies previous law and requires FDA to give manufacturers written reports of conditions seen during inspections and results of factory samples. 1962 Thalidomide, a new sleeping pill, causes severe birth defects of the arms and legs in thousands of babies born in [place]. The U.S. media reports on how Dr. Cathlean Mort, a FDA medical officer, helped prevent approval and marketing of Thalidomide in the [place] . These reports stirred up public support for stronger drug laws. 3 Congress passes the [employer]. For the first time, these laws require drug makers to prove their drug works before FDA can approve them for sale. The Advisory Committee on Investigational Drugs meets for the first time. This was the first meeting of a committee to advise FDA on product approval and policy on an ongoing basis. 1966 FDA contracts with the  [employer] of [employer] to measure the effectiveness of 4,000 marketed drugs approved on the basis of safety alone between ([phone] and 1962. The Fair Packaging and Labeling Act requires all consumer products, in interstate commerce, to be honestly and informatively labeled. 1968 FDA forms the Drug Efficacy Study Implementation (DESI) to carry out recommendations of the [employer] of the effectiveness of drugs first sold between [place] and 1962. 1970 FDA requires the first patient package insert, medicines must come with information for the patient about risks and benefits. 1972 Over-the-Counter Drug Review begins to enhance the safety, effectiveness and appropriate labeling of drugs sold without prescription. 1973 The U.S. Network Engineer upholds the [phone]  drug effectiveness law and approves FDAs action to control entire classes of products. 1982 FDA issues Tamper-resistant Packaging Regulations to prevent poisonings such as deaths from cyanide placed in Tylenol  capsules. Congress passes the [employer] in 1981/11/08, making it a crime to tamper with packaged consumer products. 1982-11-08 Drug Price Advertising Account Planner Act (Hatch-Waxman Act) increases the availability of less costly generic drugs by allowing FDA to approve applications for generic versions of brand-name drugs without repeating the research that proved the safety and effectiveness of the brand-name drugs. The Act also allowed brand-name companies to apply for up to five years additional patent protection for the new medicines they developed to make up for time lost while their products were going through FDA's approval process. 1989 The FDA issued guidelines asking drug makers to decide if a drug is likely to have usefulness in elderly people and to include elderly people in studies when applicable. 1991 In 11/09/1979, the FDA and the Department  of Health and Human Services published a policy on protecting people in research. In 1989/11/08, this policy is adopted by more than a dozen federal agencies involved in human subject research and becomes known as the Common Rule. 4 1993 FDA launches MedWatch, a system designed to collect reports from health professionals on problems with drugs and other medical products. FDA issues guidelines for measuring gender differences in responses to medication. Drug companies are encouraged to include patients of both sexes in their research of drugs and to study any gender-specific effects. 1995 FDA declares cigarettes to be drug delivery devices. Limits are issued on marketing and sales to reduce smoking by young people. 1998 FDA introduces the Adverse Event Reporting System (AERS), a computerized database designed to store and study safety reports on already marketed drugs.  The Demographic Rule requires that a marketing application review data on safety and effectiveness by age, gender, and race. The Pediatric Rule requires drug makers of selected new and existing drugs to conduct studies on drug safety and effectiveness in children. 1999 Creation of the Drug Facts Label for OTC drug products. The law requires all overthe-counter drug labels to have information in a standard format. These drug facts labels are designed to give the user easy-to-find information. 2000 The U. S. [employer], upholds an earlier decision from The [employer] and Drug Administration v. [employer]. et al. and rules 5-4 that FDA does not have authority to regulate tobacco as a drug. 08-Nov-2000 The [employer] Pharm[employer] for Children Act, in exchange for studying the drug in children, the drug maker gets six months of selling their product without competition. 11-08-2001 The Pediatric Research Equity Act gives FDA the right to ask drug companies to study the effectiveness of new drugs in children. 08-Nov-2002 FDA advises  medical professionals to limit the use of a pain reliever called Cox-2, a nonsteroidal anti-inflammatory drug (NSAIDs). Studies had shown that long-term use raised chances of heart attacks and strokes. The warning is also added to the over-thecounter NSAIDs Drug Facts label. Medicines used in hospitals must have a bar code to prevent patients from receiving the wrong medicine. 5 09-Nov-2003 The Drug Safety Board is formed, consisting of FDA staff and representatives from the [employer] of [address] and the [employer]. The Board advises the Director, Center for Drug Evaluation and Research, FDA, on drug safety issues and works with the agency in sharing safety information to health professionals and patients.  The [place]  Food and Drug Administration (FDA)  was first created to enforce the Pure Food and Drug Act of 1906. In this capacity, the FDA is charged with protecting the health of the US  public, to ensure the quality of its food, medicine, and cosmetics. Before this time, the [place]  government had no formal oversight of these products and left issues of quality and purity to the individual manufactures, or at times, individual states.    Review: [place] Stop ACT. (The Strengthen Opioid Misuse Prevention (STOP) Act of 2017). GENERAL ASSEMBLY OF [place]  SESSION 2017 SESSION LAW 2017-74 HOUSE BILL 243  PMP mandatory The dispenser shall report: (1) The dispenser's DEA number. (2) The name of the patient for whom the controlled substance is being dispensed, and the patient's: a. Full address, including city, state, and zip code, b. Telephone number, and c. Date of birth. (3) The date the prescription was written. (4) The date the prescription was filled. (5) The prescription number. (6) Whether the prescription is new or a refill. (7) Metric quantity of the dispensed drug. (8) Estimated days of supply of dispensed drug, if provided to the dispenser. (9)  National Drug Code of dispensed drug. (10) Prescriber's DEA number. (11) Method of payment for the prescription.  No paper prescriptions  Duration of scripts Acute vs Chronic prescribing  2016 CDC Guidelines for prescribing Opioids for Chronic Pain. (Updated in 2022.) Medical Board  Laws:  Prescription Laws Drug laws, rules, and regulations are constantly changing. Any attempt to summarize them would quickly become outdated. Because of that, the Board encourages practitioners who seek guidance on prescribing procedures to refer to the sources listed below in addition to the Boards position statements, rules and Medical Practice Act.  [place]  Board of Pharmacy (NCBOP) (which offers the states pharmacy laws and rules, and links to the Code of Federal Regulations) [employer] Site: www.ncbop.org  [place]  General Statutes General Web Site: politicalpool.cz See: [place]  Food, Drug, and Cosmetic Act: [id number] & 106-134 See: [place]  Pharmacy Practice Act, Article 4A: [phone] See: [place]  Controlled Substances Act, Article 5: 90-86 & 90-113.8 See: Use of controlled substances to render one mentally incapacitated or physically helpless: [employer] [facility]. Code, Title 21, Food & Drugs www.deadiversion.usdoj.gov Controlled Substances Schedules www.deadiversion.usdoj.gov Drug Warehouse Manager - www.deadiversion.usdoj.gov 42 CFR  8.12 - Federal opioid treatment standards.   Effective June 01, 2016, prior approval will be required for opioid analgesic doses for [facility]. Medicaid and [place]) beneficiaries which:  Exceed 120 mg of morphine equivalents (MME) per day  Are greater than a 14-day supply of any opioid, or,  Are non-preferred opioid products on the [place] Medicaid Preferred Drug List (PDL)  FEDERAL 42 CFR  8.12 - Federal opioid treatment standards. Title II of the Comprehensive Drug Abuse  Prevention and Control Act of 1970, commonly known as the Controlled Substance Act (CSA) Title 21 [place]  Code ([place]) Controlled Substances Act.   Reference:   ______________________________________________________________________       ______________________________________________________________________    Medication Rules  Purpose: To inform patients, and their family members, of our medication rules and regulations.  Applies to: All patients receiving prescriptions from our practice (written or electronic).  Pharmacy of record: This is the pharmacy where your electronic prescriptions will be sent. Make sure we have the correct one.  Electronic prescriptions: In compliance with the [place]  Strengthen Opioid Misuse Prevention (STOP) Act of 2017 (Session Law 2017-74/H243), effective October 06, 2018, all controlled substances must  be electronically prescribed. Written prescriptions, faxing, or calling prescriptions to a pharmacy will no longer be done.  Prescription refills: These will be provided only during in-person appointments. No medications will be renewed without a face-to-face evaluation with your provider. Applies to all prescriptions.  NOTE: The following applies primarily to controlled substances (Opioid* Pain Medications).   Type of encounter (visit): For patients receiving controlled substances, face-to-face visits are required. (Not an option and not up to the patient.)  Patient's Responsibilities: Pain Pills: Bring all pain pills to every appointment (except for procedure appointments). Pill counts are required.  Pill Bottles: Bring pills in original pharmacy bottle. Bring bottle, even if empty. Always bring the bottle of the most recent fill.  Medication refills: You are responsible for knowing and keeping track of what medications you are taking and when is it that you will need a refill. The day before your appointment: write a list of all  prescriptions that need to be refilled. The day of the appointment: give the list to the admitting nurse. Prescriptions will be written only during appointments. No prescriptions will be written on procedure days. If you forget a medication: it will not be Called in, Faxed, or electronically sent. You will need to get another appointment to get these prescribed. No early refills. Do not call asking to have your prescription filled early. Partial  or short prescriptions: Occasionally your pharmacy may not have enough pills to fill your prescription.  NEVER ACCEPT a partial fill or a prescription that is short of the total amount of pills that you were prescribed.  With controlled substances the law allows 72 hours for the pharmacy to complete the prescription.  If the prescription is not completed within 72 hours, the pharmacist will require a new prescription to be written. This means that you will be short on your medicine and we WILL NOT send another prescription to complete your original prescription.  Instead, request the pharmacy to send a carrier to a nearby branch to get enough medication to provide you with your full prescription. Prescription Accuracy: You are responsible for carefully inspecting your prescriptions before leaving our office. Have the discharge nurse carefully go over each prescription with you, before taking them home. Make sure that your name is accurately spelled, that your address is correct. Check the name and dose of your medication to make sure it is accurate. Check the number of pills, and the written instructions to make sure they are clear and accurate. Make sure that you are given enough medication to last until your next medication refill appointment. Taking Medication: Take medication as prescribed. When it comes to controlled substances, taking less pills or less frequently than prescribed is permitted and encouraged. Never take more pills than instructed. Never  take the medication more frequently than prescribed.  Inform other Doctors: Always inform, all of your healthcare providers, of all the medications you take. Pain Medication from other Providers: You are not allowed to accept any additional pain medication from any other Doctor or Healthcare provider. There are two exceptions to this rule. (see below) In the event that you require additional pain medication, you are responsible for notifying us , as stated below. Cough Medicine: Often these contain an opioid, such as codeine  or hydrocodone . Never accept or take cough medicine containing these opioids if you are already taking an opioid* medication. The combination may cause respiratory failure and death. Medication Agreement: You are responsible for carefully reading and following our Medication Agreement. This must  be signed before receiving any prescriptions from our practice. Safely store a copy of your signed Agreement. Violations to the Agreement will result in no further prescriptions. (Additional copies of our Medication Agreement are available upon request.) Laws, Rules, & Regulations: All patients are expected to follow all [address] and [employer], Rules, [place] [employer]. Ignorance of the Laws does not constitute a valid excuse.  Illegal drugs and Controlled Substances: The use of illegal substances (including, but not limited to marijuana and its derivatives) and/or the illegal use of any controlled substances is strictly prohibited. Violation of this rule may result in the immediate and permanent discontinuation of any and all prescriptions being written by our practice. The use of any illegal substances is prohibited. Adopted CDC guidelines & recommendations: Target dosing levels will be at or below 60 MME/day. Use of benzodiazepines** is not recommended. Urine Drug testing: Patients taking controlled substances will be required to provide a urine sample upon request. Do not void before  coming to your medication management appointments. Hold emptying your bladder until you are admitted. The admitting nurse will inform you if a sample is required. Our practice reserves the right to call you at any time to provide a sample. Once receiving the call, you have 24 hours to comply with request. Not providing a sample upon request may result in termination of medication therapy.  Exceptions: There are only two exceptions to the rule of not receiving pain medications from other Healthcare Providers. Exception #1 (Emergencies): In the event of an emergency (i.e.: accident requiring emergency care), you are allowed to receive additional pain medication. However, you are responsible for: As soon as you are able, call our office [phone], at any time of the day or night, and leave a message stating your name, the date and nature of the emergency, and the name and dose of the medication prescribed. In the event that your call is answered by a member of our staff, make sure to document and save the date, time, and the name of the person that took your information.  Exception #2 (Planned Surgery): In the event that you are scheduled by another doctor or dentist to have any type of surgery or procedure, you are allowed (for a period no longer than 30 days), to receive additional pain medication, for the acute post-op pain. However, in this case, you are responsible for picking up a copy of our Post-op Pain Management for Surgeons handout, and giving it to your surgeon or dentist. This document is available at our office, and does not require an appointment to obtain it. Simply go to our office during business hours (Monday-Thursday from 8:00 AM to 4:00 PM) (Friday 8:00 AM to 12:00 Noon) or if you have a scheduled appointment with us , prior to your surgery, and ask for it by name. In addition, you are responsible for: calling our office [phone], at any time of the day or night, and leaving a  message stating your name, name of your surgeon, type of surgery, and date of procedure or surgery. Failure to comply with your responsibilities may result in termination of therapy involving the controlled substances.  Consequences:  Non-compliance with the above rules may result in permanent discontinuation of medication prescription therapy. All patients receiving any type of controlled substance is expected to comply with the above patient responsibilities. Not doing so may result in permanent discontinuation of medication prescription therapy. Medication Agreement Violation. Following the above rules, including your responsibilities will help  you in avoiding a Medication Agreement Violation (Breaking your Pain Medication Contract).  *Opioid medications include: morphine, codeine , oxycodone , oxymorphone, hydrocodone , hydromorphone , meperidine , tramadol , tapentadol, buprenorphine , fentanyl , methadone. **Benzodiazepine medications include: diazepam  (Valium ), alprazolam (Xanax), clonazepam (Klonopine), lorazepam (Ativan), clorazepate (Tranxene), chlordiazepoxide (Librium), estazolam (Prosom), oxazepam (Serax), temazepam (Restoril), triazolam (Halcion) (Last updated: 07/29/2023) ______________________________________________________________________     ______________________________________________________________________    Medication Recommendations and Reminders  Applies to: All patients receiving prescriptions (written and/or electronic).  Medication Rules & Regulations: You are responsible for reading, knowing, and following our Medication Rules document. These exist for your safety and that of others. They are not flexible and neither are we. Dismissing or ignoring them is an act of non-compliance that may result in complete and irreversible termination of such medication therapy. For safety reasons, non-compliance will not be tolerated. As with the U[place]S. fundamental legal principle of  ignorance of the law is no defense, we will accept no excuses for not having read and knowing the content of documents provided to you by our practice.  Pharmacy of record:  Definition: This is the pharmacy where your electronic prescriptions will be sent.  We do not endorse any particular pharmacy. It is up to you and your insurance to decide what pharmacy to use.  We do not restrict you in your choice of pharmacy. However, once we write for your prescriptions, we will NOT be re-sending more prescriptions to fix restricted supply problems created by your pharmacy, or your insurance.  The pharmacy listed in the electronic medical record should be the one where you want electronic prescriptions to be sent. If you choose to change pharmacy, simply notify our nursing staff. Changes will be made only during your regular appointments and not over the phone.  Recommendations: Keep all of your pain medications in a safe place, under lock and key, even if you live alone. We will NOT replace lost, stolen, or damaged medication. We do not accept Police Reports as proof of medications having been stolen. After you fill your prescription, take 1 week's worth of pills and put them away in a safe place. You should keep a separate, properly labeled bottle for this purpose. The remainder should be kept in the original bottle. Use this as your primary supply, until it runs out. Once it's gone, then you know that you have 1 week's worth of medicine, and it is time to come in for a prescription refill. If you do this correctly, it is unlikely that you will ever run out of medicine. To make sure that the above recommendation works, it is very important that you make sure your medication refill appointments are scheduled at least 1 week before you run out of medicine. To do this in an effective manner, make sure that you do not leave the office without scheduling your next medication management appointment. Always ask  the nursing staff to show you in your prescription , when your medication will be running out. Then arrange for the receptionist to get you a return appointment, at least 7 days before you run out of medicine. Do not wait until you have 1 or 2 pills left, to come in. This is very poor planning and does not take into consideration that we may need to cancel appointments due to bad weather, sickness, or emergencies affecting our staff. DO NOT ACCEPT A Partial Fill: If for any reason your pharmacy does not have enough pills/tablets to completely fill or refill your prescription, do not allow for a partial  fill. The law allows the pharmacy to complete that prescription within 72 hours, without requiring a new prescription. If they do not fill the rest of your prescription within those 72 hours, you will need a separate prescription to fill the remaining amount, which we will NOT provide. If the reason for the partial fill is your insurance, you will need to talk to the pharmacist about payment alternatives for the remaining tablets, but again, DO NOT ACCEPT A PARTIAL FILL, unless you can trust your pharmacist to obtain the remainder of the pills within 72 hours.  Prescription refills and/or changes in medication(s):  Prescription refills, and/or changes in dose or medication, will be conducted only during scheduled medication management appointments. (Applies to both, written and electronic prescriptions.) No refills on procedure days. No medication will be changed or started on procedure days. No changes, adjustments, and/or refills will be conducted on a procedure day. Doing so will interfere with the diagnostic portion of the procedure. No phone refills. No medications will be called into the pharmacy. No Fax refills. No weekend refills. No Holliday refills. No after hours refills.  Remember:  Business hours are:  Monday to Thursday 8:00 AM to 4:00 PM Provider's Schedule: Eric Como, MD -  Appointments are:  Medication management: Monday and Wednesday 8:00 AM to 4:00 PM Procedure day: Tuesday and Thursday 7:30 AM to 4:00 PM Wallie Sherry, MD - Appointments are:  Medication management: Tuesday and Thursday 8:00 AM to 4:00 PM Procedure day: Monday and Wednesday 7:30 AM to 4:00 PM (Last update: 07/29/2022) ______________________________________________________________________     ______________________________________________________________________    National Pain Medication Shortage  The [place] is experiencing worsening drug shortages. These have had a negative widespread effect on patient care and treatment. Not expected to improve any time soon. Predicted to last past 2029.   Drug shortage list (generic names) Oxycodone  IR Oxycodone /APAP Oxymorphone IR Hydromorphone  Hydrocodone /APAP Morphine  Where is the problem?  Manufacturing and supply level.  Will this shortage affect you?  Only if you take any of the above pain medications.  How? You may be unable to fill your prescription.  Your pharmacist may offer a partial fill of your prescription. (Warning: Do not accept partial fills.) Prescriptions partially filled cannot be transferred to another pharmacy. Read our Medication Rules and Regulation. Depending on how much medicine you are dependent on, you may experience withdrawals when unable to get the medication.  Recommendations: Consider ending your dependence on opioid pain medications. Ask your pain specialist to assist you with the process. Consider switching to a medication currently not in shortage, such as Buprenorphine . Talk to your pain specialist about this option. Consider decreasing your pain medication requirements by managing tolerance thru Drug Holidays. This may help minimize withdrawals, should you run out of medicine. Control your pain thru the use of non-pharmacological interventional therapies.   Your prescriber: Prescribers cannot be  blamed for shortages. Medication manufacturing and supply issues cannot be fixed by the prescriber.   NOTE: The prescriber is not responsible for supplying the medication, or solving supply issues. Work with your pharmacist to solve it. The patient is responsible for the decision to take or continue taking the medication and for identifying and securing a legal supply source. By law, supplying the medication is the job and responsibility of the pharmacy. The prescriber is responsible for the evaluation, monitoring, and prescribing of these medications.   Prescribers will NOT: Re-issue prescriptions that have been partially filled. Re-issue prescriptions already sent to a pharmacy.  Re-send prescriptions to a different pharmacy because yours did not have your medication. Ask pharmacist to order more medicine or transfer the prescription to another pharmacy. (Read below.)  New 2023 regulation: June 06, 2022 Revised Regulation Allows DEA-Registered Pharmacies to Transfer Electronic Prescriptions at a Patients Request DEA Headquarters Division - Public Information Office Patients now have the ability to request their electronic prescription be transferred to another pharmacy without having to go back to their practitioner to initiate the request. This revised regulation went into effect on Monday, June 02, 2022.     At a patients request, a DEA-registered retail pharmacy can now transfer an electronic prescription for a controlled substance (schedules II-V) to another DEA-registered retail pharmacy. Prior to this change, patients would have to go through their practitioner to cancel their prescription and have it re-issued to a different pharmacy. The process was taxing and time consuming for both patients and practitioners.    The Drug Enforcement Administration [facility]) published its intent to revise the process for transferring electronic prescriptions on August 24, 2020.  The final rule was  published in the federal register on May 01, 2022 and went into effect 30 days later.  Under the final rule, a prescription can only be transferred once between pharmacies, and only if allowed under existing state or other applicable law. The prescription must remain in its electronic form; may not be altered in any way; and the transfer must be communicated directly between two licensed pharmacists. Its important to note, any authorized refills transfer with the original prescription, which means the entire prescription will be filled at the same pharmacy.  Reference: hugehand.is [facility] website announcement)  Cheapwipes.at.pdf [employer])   [employer] / Vol. 88, No. 143 / Thursday, May 01, 2022 / Rules and Regulations DEPARTMENT OF JUSTICE  Drug Enforcement Administration  21 CFR Part 1306  [Docket No. [id number]]  RIN [id number] Transfer of Electronic Prescriptions for Schedules II-V Controlled Substances Between Pharmacies for Initial Filling  ______________________________________________________________________       ______________________________________________________________________    Transfer of Pain Medication between Pharmacies  Re: 2023 DEA Clarification on existing regulation  Published on DEA Website: June 06, 2022  Title: Revised Regulation Allows DEA-Registered Pharmacies to Electrical Engineer Prescriptions at a Patients Request DEA Headquarters Division - [employer]  Patients now have the ability to request their electronic prescription be transferred to another pharmacy without having to go back to their practitioner to initiate the request. This revised regulation went into effect on Monday, June 02, 2022.     At a patients request, a DEA-registered  retail pharmacy can now transfer an electronic prescription for a controlled substance (schedules II-V) to another DEA-registered retail pharmacy. Prior to this change, patients would have to go through their practitioner to cancel their prescription and have it re-issued to a different pharmacy. The process was taxing and time consuming for both patients and practitioners.    The Drug Enforcement Administration [facility]) published its intent to revise the process for transferring electronic prescriptions on August 24, 2020.  The final rule was published in the federal register on May 01, 2022 and went into effect 30 days later.  Under the final rule, a prescription can only be transferred once between pharmacies, and only if allowed under existing state or other applicable law. The prescription must remain in its electronic form; may not be altered in any way; and the transfer must be communicated directly between two licensed pharmacists. Its  important to note, any authorized refills transfer with the original prescription, which means the entire prescription will be filled at the same pharmacy.    REFERENCES: 1. DEA website announcement hugehand.is  2. Department of Justice website  Cheapwipes.at.pdf  3. DEPARTMENT OF JUSTICE Drug Enforcement Administration 21 CFR Part 1306 [Docket No. [id number]] RIN [id number] Transfer of Electronic Prescriptions for Schedules II-V Controlled Substances Between Pharmacies for Initial Filling  ______________________________________________________________________       ______________________________________________________________________    Medication Transfer   Notification You are currently compliant and stable on your pain medication regimen. This regimen will be transferred today to your Primary  Care Provider (PCP). You will be provided with enough prescriptions to last for 90 days. After that, your prescriptions will need to be taken over by your PCP.  Recommendation Immediately contact your primary care provider to secure an appointment for evaluation before this period is over. Do not wait until the last month to contact them.   Clarification The transfer of your medication regimen does not mean that you are being discharged from our clinic. We will remain available to you for any consultation or interventional therapies you may need.   Alternative Should you decide not to continue taking these medication and would like assistance in permanently stopping them, please let us  know so that we can design a slow tapering down of your regimen.  Reason Our primary responsibility to provide specialized interventional pain management therapies otherwise not available to the community. We have in the past assisted primary care providers with reviewing and adjusting pain medication management therapies, however, we have been transparent to all patients and referring providers that it is not our intention to permanently take over this type of therapy. Transfer of this portion of your care will assist us  in freeing time to assist others in need of our specialty services.   ______________________________________________________________________      ______________________________________________________________________    WARNING: CBD (cannabidiol) & Delta (Delta-8 tetrahydrocannabinol) products.   Applicable to:  All individuals currently taking or considering taking CBD (cannabidiol) and, more important, all patients taking opioid analgesic controlled substances (pain medication). (Example: oxycodone ; oxymorphone; hydrocodone ; hydromorphone ; morphine; methadone; tramadol ; tapentadol; fentanyl ; buprenorphine ; butorphanol; dextromethorphan; meperidine ; codeine ; etc.)  Introduction:  Recently  there has been a drive towards the use of natural products for the treatment of different conditions, including pain anxiety and sleep disorders. Marijuana and hemp are two varieties of the cannabis genus plants. Marijuana and its derivatives are illegal, while hemp and its derivatives are not. Cannabidiol (CBD) and tetrahydrocannabinol (THC), are two natural compounds found in plants of the Cannabis genus. They can both be extracted from hemp or marijuana. Both compounds interact with your bodys endocannabinoid system in very different ways. CBD is associated with pain relief (analgesia) while THC is associated with the psychoactive effects (the high) obtained from the use of marijuana products. There are two main types of THC: Delta-9, which comes from the marijuana plant and it is illegal, and Delta-8, which comes from the hemp plant, and it is legal. (Both, Delta-9-THC and Delta-8-THC are psychoactive and give you the high.)   Legality:  Marijuana and its derivatives: illegal Hemp and its derivatives: Legal (State dependent) UPDATE: (11/22/2021) The Drug Enforcement Agency (DEA) issued a letter stating that delta cannabinoids, including Delta-8-THCO and Delta-9-THCO, synthetically derived from hemp do not qualify as hemp and will be viewed as Schedule I drugs. (Schedule I drugs, substances, or chemicals are defined as drugs with no currently accepted  medical use and a high potential for abuse. Some examples of Schedule I drugs are: heroin, lysergic acid diethylamide (LSD), marijuana (cannabis), 3,4-methylenedioxymethamphetamine (ecstasy), methaqualone, and peyote.) (cuetune.com.ee)  Legal status of CBD in [place]:  Conditionally Legal  Reference: FDA Regulation of Cannabis and Cannabis-Derived Products, Including Cannabidiol (CBD) - oemdeals.dk  Warning:  CBD is not  FDA approved and has not undergo the same manufacturing controls as prescription drugs.  This means that the purity and safety of available CBD may be questionable. Most of the time, despite manufacturer's claims, it is contaminated with THC (delta-9-tetrahydrocannabinol - the chemical in marijuana responsible for the HIGH).  When this is the case, the [facility] contaminant will trigger a positive urine drug screen (UDS) test for Marijuana (carboxy-THC).   The FDA recently put out a warning about 5 things that everyone should be aware of regarding Delta-8 THC: Delta-8 THC products have not been evaluated or approved by the FDA for safe use and may be marketed in ways that put the public health at risk. The FDA has received adverse event reports involving delta-8 THC-containing products. Delta-8 THC has psychoactive and intoxicating effects. Delta-8 THC manufacturing often involve use of potentially harmful chemicals to create the concentrations of delta-8 THC claimed in the marketplace. The final delta-8 THC product may have potentially harmful by-products (contaminants) due to the chemicals used in the process. Manufacturing of delta-8 THC products may occur in uncontrolled or unsanitary settings, which may lead to the presence of unsafe contaminants or other potentially harmful substances. Delta-8 THC products should be kept out of the reach of children and pets.  NOTE: Because a positive UDS for any illicit substance is a violation of our medication agreement, your opioid analgesics (pain medicine) may be permanently discontinued.  MORE ABOUT CBD  General Information: CBD was discovered in [birth year] and it is a derivative of the cannabis sativa genus plants (Marijuana and Hemp). It is one of the 113 identified substances found in Marijuana. It accounts for up to 40% of the plant's extract. As of 2018, preliminary clinical studies on CBD included research for the treatment of anxiety, movement disorders, and  pain. CBD is available and consumed in multiple forms, including inhalation of smoke or vapor, as an aerosol spray, and by mouth. It may be supplied as an oil containing CBD, capsules, dried cannabis, or as a liquid solution. CBD is thought not to be as psychoactive as THC (delta-9-tetrahydrocannabinol - the chemical in marijuana responsible for the HIGH). Studies suggest that CBD may interact with different biological target receptors in the body, including cannabinoid and other neurotransmitter receptors. As of 2018 the mechanism of action for its biological effects has not been determined.  Side-effects  Adverse reactions: Dry mouth, diarrhea, decreased appetite, fatigue, drowsiness, malaise, weakness, sleep disturbances, and others.  Drug interactions:  CBD may interact with medications such as blood-thinners. CBD causes drowsiness on its own and it will increase drowsiness caused by other medications, including antihistamines (such as Benadryl ), benzodiazepines (Xanax, Ativan, Valium ), antipsychotics, antidepressants, opioids, alcohol  and supplements such as kava, melatonin and St. John's Wort.  Other drug interactions: Brivaracetam (Briviact); Caffeine; Carbamazepine (Tegretol); Citalopram (Celexa); Clobazam (Onfi); Eslicarbazepine (Aptiom); Everolimus (Zostress); Lithium; Methadone (Dolophine); Rufinamide (Banzel); Sedative medications (CNS depressants); Sirolimus (Rapamune); Stiripentol (Diacomit); Tacrolimus (Prograf); Tamoxifen ; Soltamox); Topiramate  (Topamax ); Valproate; Warfarin (Coumadin); Zonisamide. (Last update: 09/15/2022) ______________________________________________________________________     ______________________________________________________________________    Muscle Spasms & Cramps  Cause(s):  Most common - vitamin and/or electrolyte (calcium, potassium, sodium, etc.)  deficiencies. Post procedure - steroids (injected, oral, or inhaled) can make your kidneys excrete  (loose) electrolytes. Most of the time this will not cause any symptoms however, if you happen to be borderline low on your electrolytes, it may temporarily triggering cramps & spasms.  Possible triggers: Sweating - causes loss of electrolytes thru the skin. Steroids - causes loss of electrolytes thru the urine.  Treatment: (over-the-counter)  Gatorade (or any other electrolyte-replenishing drink) - Take 1, 8 oz glass with each meal (3 times a day). Mechanism of action: Replenishes lost electrolytes. Magnesium 400 to 500 mg - Take 1 tablet twice a day (one with breakfast and one at bedtime). If you have kidney disease talk to your primary care physician before taking any Magnesium. Mechanism of action: Magnesium is a natural muscle relaxant. Tonic Water with quinine - Take 1, 8 oz glass before bedtime.  Mechanism of action: Quinine is used to treat spasms.  Last Update: 04/16/2023  ______________________________________________________________________     ______________________________________________________________________    Appointment Information  It is our goal and responsibility to provide the medical community with assistance in the evaluation and management of patients with chronic pain. Unfortunately our resources are limited. Because we do not have an unlimited amount of time, or available appointments, we are required to closely monitor for unkept or cancelled appointments.  Patient's responsibilities: 1. Punctuality: Patients are required to be physically present in our office at least 15 minutes before their scheduled appointment. 2. Tardiness: Patients not physically present in our office at their scheduled appointment time will be rescheduled. 3. Plan ahead: Assume that you will encounter traffic and plan to arrive 30 minutes before your appointment. 4. Other appointments and responsibilities: Do not schedule other appointments immediately before or after your scheduled  appointment.  5. Be prepared: Make a list of everything that you need to discuss with your provider so that you use your time efficiently. Once the provider leaves your room, he/she will not return to your room to discuss anything that you neglected to bring up during your allowed time. 6. No children or pets: Do not bring children or pets to your appointment. 7. Cancelling or rescheduling your appointment: Advanced notification (more than 24 hours in advance) is required. 8. No Show: Not calling to cancel an appointment and simply not showing up is unacceptable. This leads to loss of appointments that could have been used by a patient in need. (See below)  Corrective process for repeat offenders:  No Shows: Three (3) No Shows within a 12 month period will result in an automatic discharge from our practice. Rescheduling or cancelling with more than 24 hours notice will not be penalized and will not count against you. Tardiness: If you have to be rescheduled three (3) times due to late arrivals, it will be counted as one (1) No Show. Cancellation or reschedule: Three (3) cancellations or rescheduling where notice was given with less than 24 hours in advance, will be recorded as one (1) No Show.  Types of appointments: New patient initial evaluation: These are evaluations only. Your initial patient questionnaire will be collected and entered into the system. A history of present illness will be taken. Prior lab work, imaging studies, and associated treatments will be reviewed. The provider may order appropriate diagnostic testing depending on their evaluation and review of available information. No treatments will be started on this visit. 2nd Follow-up visit: During this visit your provider will inform you of the results of the diagnostic tests  ordered on the initial evaluation. Based on the providers assessment, treatment options will be offered, at which the patient will decide if he/she is  interested in the alternatives. If interested, a treatment plan will be established and started. Procedure visits: Post-procedure evaluation visits: Evaluation visits MM New problems Flare-up evaluations Follow-up after diagnostic testing ______________________________________________________________________     ______________________________________________________________________    Procedure instructions  Stop blood-thinners  Do not eat or drink fluids (other than water) for 8 hours before your procedure  No water for 2 hours before your procedure  Take your blood pressure medicine with a sip of water  Arrive 30 minutes before your appointment  If sedation is planned, bring suitable driver. [place], [place], & public transportation are NOT APPROVED)  Carefully read the Preparing for your procedure detailed instructions  If you have questions call us  at [phone]  Procedure appointments are for procedures only.   NO medication refills or new problem evaluations will be done on procedure days.   Only the scheduled, pre-approved procedure and side will be done.   ______________________________________________________________________     ______________________________________________________________________    Preparing for your procedure  Appointments: If you think you may not be able to keep your appointment, call 24-48 hours in advance to cancel. We need time to make it available to others.  Procedure visits are for procedures only. During your procedure appointment there will be: NO Prescription Refills*. NO medication changes or discussions*. NO discussion of disability issues*. NO unrelated pain problem evaluations*. NO evaluations to order other pain procedures*. *These will be addressed at a separate and distinct evaluation encounter on the provider's evaluation schedule and not during procedure days.  Instructions: Food intake: Avoid eating anything  solid for at least 8 hours prior to your procedure. Clear liquid intake: You may take clear liquids such as water up to 2 hours prior to your procedure. (No carbonated drinks. No soda.) Transportation: Unless otherwise stated by your physician, bring a driver. (Driver cannot be a Market Researcher, Pharmacist, Community, or any other form of public transportation.) Morning Medicines: Except for blood thinners, take all of your other morning medications with a sip of water. Make sure to take your heart and blood pressure medicines. If your blood pressure's lower number is above 100, the case will be rescheduled. Blood thinners: Make sure to stop your blood thinners as instructed.  If you take a blood thinner, but were not instructed to stop it, call our office [phone] and ask to talk to a nurse. Not stopping a blood thinner prior to certain procedures could lead to serious complications. Diabetics on insulin : Notify the staff so that you can be scheduled 1st case in the morning. If your diabetes requires high dose insulin , take only  of your normal insulin  dose the morning of the procedure and notify the staff that you have done so. Preventing infections: Shower with an antibacterial soap the morning of your procedure.  Build-up your immune system: Take 1000 mg of Vitamin C with every meal (3 times a day) the day prior to your procedure. Antibiotics: Inform the nursing staff if you are taking any antibiotics or if you have any conditions that may require antibiotics prior to procedures. (Example: recent joint implants)   Pregnancy: If you are pregnant make sure to notify the nursing staff. Not doing so may result in injury to the fetus, including death.  Sickness: If you have a cold, fever, or any active infections, call and cancel or reschedule your procedure.  Receiving steroids while having an infection may result in complications. Arrival: You must be in the facility at least 30 minutes prior to your scheduled  procedure. Tardiness: Your scheduled time is also the cutoff time. If you do not arrive at least 15 minutes prior to your procedure, you will be rescheduled.  Children: Do not bring any children with you. Make arrangements to keep them home. Dress appropriately: There is always a possibility that your clothing may get soiled. Avoid long dresses. Valuables: Do not bring any jewelry or valuables.  Reasons to call and reschedule or cancel your procedure: (Following these recommendations will minimize the risk of a serious complication.) Surgeries: Avoid having procedures within 2 weeks of any surgery. (Avoid for 2 weeks before or after any surgery). Flu Shots: Avoid having procedures within 2 weeks of a flu shots or . (Avoid for 2 weeks before or after immunizations). Barium: Avoid having a procedure within 7-10 days after having had a radiological study involving the use of radiological contrast. (Myelograms, Barium swallow or enema study). Heart attacks: Avoid any elective procedures or surgeries for the initial 6 months after a Myocardial Infarction (Heart Attack). Blood thinners: It is imperative that you stop these medications before procedures. Let us  know if you if you take any blood thinner.  Infection: Avoid procedures during or within two weeks of an infection (including chest colds or gastrointestinal problems). Symptoms associated with infections include: Localized redness, fever, chills, night sweats or profuse sweating, burning sensation when voiding, cough, congestion, stuffiness, runny nose, sore throat, diarrhea, nausea, vomiting, cold or Flu symptoms, recent or current infections. It is specially important if the infection is over the area that we intend to treat. Heart and lung problems: Symptoms that may suggest an active cardiopulmonary problem include: cough, chest pain, breathing difficulties or shortness of breath, dizziness, ankle swelling, uncontrolled high or unusually low  blood pressure, and/or palpitations. If you are experiencing any of these symptoms, cancel your procedure and contact your primary care physician for an evaluation.  Remember:  Regular Business hours are:  Monday to Thursday 8:00 AM to 4:00 PM  Provider's Schedule: Eric Como, MD:  Procedure days: Tuesday and Thursday 7:30 AM to 4:00 PM  Wallie Sherry, MD:  Procedure days: Monday and Wednesday 7:30 AM to 4:00 PM Last  Updated: 09/15/2023 ______________________________________________________________________

## 2024-10-12 NOTE — Progress Notes (Signed)
 Nursing Pain Medication Assessment:  Safety precautions to be maintained throughout the outpatient stay will include: orient to surroundings, keep bed in low position, maintain call bell within reach at all times, provide assistance with transfer out of bed and ambulation.  Medication Inspection Compliance: Pill count conducted under aseptic conditions, in front of the patient. Neither the pills nor the bottle was removed from the patient's sight at any time. Once count was completed pills were immediately returned to the patient in their original bottle.  Medication: Belbuca  Pill/Patch Count: 47 of 60 pills/patches remain Pill/Patch Appearance: Markings consistent with prescribed medication Bottle Appearance: Standard pharmacy container. Clearly labeled. Filled Date: 20 / 06 / 2025 Last Medication intake:  Day before yesterday

## 2024-10-14 ENCOUNTER — Telehealth: Payer: Self-pay

## 2024-10-14 ENCOUNTER — Other Ambulatory Visit (HOSPITAL_COMMUNITY): Payer: Self-pay

## 2024-10-14 NOTE — Telephone Encounter (Signed)
 Pharmacy Patient Advocate Encounter   Received notification from CoverMyMeds that prior authorization for Zepbound  2.5mg /0.81ml is due for renewal.   Insurance verification completed.   The patient is insured through [employer].  Is the patient still using the Zepbound ?  PA renewal has not been submitted at this time.   KEY: [id number]

## 2024-10-17 ENCOUNTER — Ambulatory Visit (INDEPENDENT_AMBULATORY_CARE_PROVIDER_SITE_OTHER): Admitting: Nurse Practitioner

## 2024-10-17 ENCOUNTER — Encounter (INDEPENDENT_AMBULATORY_CARE_PROVIDER_SITE_OTHER): Payer: Self-pay | Admitting: Nurse Practitioner

## 2024-10-17 VITALS — BP 135/78 | HR 78 | Resp 18 | Ht 63.0 in | Wt 264.2 lb

## 2024-10-17 DIAGNOSIS — M170 Bilateral primary osteoarthritis of knee: Secondary | ICD-10-CM

## 2024-10-17 DIAGNOSIS — I10 Essential (primary) hypertension: Secondary | ICD-10-CM | POA: Diagnosis not present

## 2024-10-17 DIAGNOSIS — I890 Lymphedema, not elsewhere classified: Secondary | ICD-10-CM

## 2024-10-17 DIAGNOSIS — M5417 Radiculopathy, lumbosacral region: Secondary | ICD-10-CM | POA: Diagnosis not present

## 2024-10-17 NOTE — Telephone Encounter (Signed)
 Cancelled the PA due to the patient is no longer seen at this office

## 2024-10-17 NOTE — Progress Notes (Signed)
 "  Subjective:    Patient ID: Heather Sweeney, female    DOB: 01-04-1966, 59 y.o.   MRN: [id number] Chief Complaint  Patient presents with   Follow-up    1 year follow up no studies     HPI  Discussed the use of AI scribe software for clinical note transcription with the patient, who gave verbal consent to proceed.  History of Present Illness Heather Sweeney is a 59 year old female with lymphedema who presents for follow-up of chronic lower extremity swelling and pain.  She reports progressive worsening of bilateral lower extremity swelling since her last visit, with increased enlargement and discomfort most pronounced in the posterior legs, particularly the ankles and calves. She reports that a shoe fitter measured her legs and told her that her right leg is larger than her left. She experiences persistent pain in the posterior legs and popliteal regions, which is exacerbated by activities such as sweeping. She expresses frustration with the cosmetic appearance of her legs and states the swelling has not improved.  She uses a pneumatic compression pump both day and night, as well as compression garments, without symptomatic relief. She reports that a shoe fitter measured her legs and she expresses interest in diabetic shoes. She inquires about the potential for lymphedema to affect other organ systems, such as the lungs.    Results     Review of Systems  Cardiovascular:  Positive for leg swelling.  Musculoskeletal:  Positive for arthralgias, back pain and gait problem.  All other systems reviewed and are negative.      Objective:   Physical Exam Vitals reviewed.  HENT:     Head: Normocephalic.  Cardiovascular:     Rate and Rhythm: Normal rate and regular rhythm.     Pulses: Normal pulses.  Pulmonary:     Effort: Pulmonary effort is normal.  Musculoskeletal:     Right lower leg: Edema present.     Left lower leg: Edema present.  Skin:    General: Skin is warm and dry.   Neurological:     Mental Status: She is alert and oriented to person, place, and time.     Gait: Gait abnormal.  Psychiatric:        Mood and Affect: Mood normal.        Behavior: Behavior normal.        Thought Content: Thought content normal.        Judgment: Judgment normal.     Physical Exam EXTREMITIES: Swelling in the lower leg, near the ankle, and in the calf. Mild swelling around the ankles.  BP 135/78 (BP Location: Left Arm, Patient Position: Sitting, Cuff Size: Large)   Pulse 78   Resp 18   Ht 5' 3 (1.6 m)   Wt 264 lb 3.2 oz (119.8 kg)   BMI 46.80 kg/m   Past Medical History:  Diagnosis Date   Abnormal MRI, lumbar spine 02/12/2021   Abrasion of anterior left lower leg 07/10/2022   Allergic rhinitis    Annual physical exam 04/20/2020   Anxiety    Aperistalsis, esophagus 10/25/2020   Arthritis of knee, right    Arthritis of right knee 10/25/2020   Asthma    Bacterial vaginosis 10/20/2019   Bipolar disorder (HCC)    BMI 37.0-37.9, adult 03/07/2022   BMI 40.0-44.9, adult (HCC) 06/17/2022   Bronchiectasis without complication (HCC) 04/04/2022   Chronic low back pain    Chronic midline low back pain 12/17/2021  Class 3 severe obesity due to excess calories in adult [facility]) 04/09/2022   CTS (carpal tunnel syndrome)    hands   Depression    Diabetes mellitus without complication (HCC)    Early satiety 02/11/2022   Elevated liver enzymes 08/17/2020   Encounter for screening colonoscopy    Encounter for screening for COVID-19 10/10/2022   Frequent PVCs 09/24/2016   Gallstones    GERD (gastroesophageal reflux disease)    History of prediabetes    Hives 02/23/2019   Hypertension    Hypokalemia 12/13/2021   Incontinence of feces 02/12/2021   Ingrown right big toenail 04/20/2020   Iron deficiency anemia 12/13/2021   Laceration of right foot    Lymphedema 08/24/2022   Obesity (BMI 30-39.9) 09/10/2021   Onychomycosis    OSA on CPAP    does not use cpap    Osteoarthritis of knee 12/17/2021   Overactive bladder    Pain and swelling of right knee 04/20/2020   PVC's (premature ventricular contractions)    PVC's (premature ventricular contractions)    Dr. MARLA cards   S/P laparoscopic cholecystectomy April 2019 01/12/2018   Seizures (HCC)    8th grade   Tachycardia 10/10/2022   Urticaria 02/23/2019   Weight loss 02/11/2022    Social History   Socioeconomic History   Marital status: Married    Spouse name: Not on file   Number of children: Not on file   Years of education: Not on file   Highest education level: Not on file  Occupational History   Not on file  Tobacco Use   Smoking status: Former    Types: E-cigarettes   Smokeless tobacco: Current    Types: Chew   Tobacco comments:    She uses dip daily. She hasn't smoke cigarettes.  Vaping Use   Vaping status: Never Used  Substance and Sexual Activity   Alcohol  use: Not Currently    Comment: last use 8 years ago   Drug use: Not Currently    Types: Marijuana    Comment: last use age 85   Sexual activity: Not Currently    Partners: Male    Birth control/protection: Surgical    Comment: Hysterectomy 2012  Other Topics Concern   Not on file  Social History Narrative   Married    No guns    Wears seat belt    Safe in relationship    Never smoker    2 sons Dorise ~10 and another son ~28 as of 10/2019   1 granson eli 1 y.o as of 10/2019   2 sisters    Unemployed       Social Drivers of Health   Tobacco Use: High Risk (10/17/2024)   Patient History    Smoking Tobacco Use: Former    Smokeless Tobacco Use: Current    Passive Exposure: Not on Actuary Strain: Low Risk (09/22/2023)   Overall Financial Resource Strain (CARDIA)    Difficulty of Paying Living Expenses: Not hard at all  Food Insecurity: No Food Insecurity (09/22/2023)   Hunger Vital Sign    Worried About Running Out of Food in the Last Year: Never true    Ran Out of Food in the Last Year: Never  true  Transportation Needs: No Transportation Needs (09/22/2023)   PRAPARE - Administrator, Civil Service (Medical): No    Lack of Transportation (Non-Medical): No  Physical Activity: Inactive (09/22/2023)   Exercise Vital Sign  Days of Exercise per Week: 0 days    Minutes of Exercise per Session: 0 min  Stress: Stress Concern Present (09/22/2023)   [employer] of Occupational Health - Occupational Stress Questionnaire    Feeling of Stress : To some extent  Social Connections: Socially Integrated (09/22/2023)   Social Connection and Isolation Panel    Frequency of Communication with Friends and Family: More than three times a week    Frequency of Social Gatherings with Friends and Family: More than three times a week    Attends Religious Services: More than 4 times per year    Active Member of Clubs or Organizations: Yes    Attends Banker Meetings: More than 4 times per year    Marital Status: Married  Catering Manager Violence: Not At Risk (09/22/2023)   Humiliation, Afraid, Rape, and Kick questionnaire    Fear of Current or Ex-Partner: No    Emotionally Abused: No    Physically Abused: No    Sexually Abused: No  Depression (PHQ2-9): Low Risk (10/12/2024)   Depression (PHQ2-9)    PHQ-2 Score: 0  Alcohol  Screen: Low Risk (09/22/2023)   Alcohol  Screen    Last Alcohol  Screening Score (AUDIT): 0  Housing: Unknown (09/22/2023)   Housing Stability Vital Sign    Unable to Pay for Housing in the Last Year: No    Number of Times Moved in the Last Year: Not on file    Homeless in the Last Year: No  Utilities: Not At Risk (09/22/2023)   AHC Utilities    Threatened with loss of utilities: No  Health Literacy: Inadequate Health Literacy (09/22/2023)   B1300 Health Literacy    Frequency of need for help with medical instructions: Sometimes    Past Surgical History:  Procedure Laterality Date   ABDOMINAL HYSTERECTOMY  07/08/2011   CARPAL TUNNEL  RELEASE     right hand 1996   CESAREAN SECTION     2012   CHOLECYSTECTOMY N/A 01/12/2018   Procedure: LAPAROSCOPIC CHOLECYSTECTOMY WITH INTRAOPERATIVE CHOLANGIOGRAM ERAS PATHWAY;  Surgeon: Gladis Cough, MD;  Location: [facility] ORS;  Service: General;  Laterality: N/A;   COLONOSCOPY WITH PROPOFOL  N/A 04/15/2019   Procedure: COLONOSCOPY WITH PROPOFOL ;  Surgeon: Therisa Bi, MD;  Location: [facility] ENDOSCOPY;  Service: Gastroenterology;  Laterality: N/A;   DILATION AND CURETTAGE OF UTERUS     04/2011 benign endometrial polyp squamous metaplasia, inflamm, blood, mucous, benign    ESOPHAGOGASTRODUODENOSCOPY (EGD) WITH PROPOFOL  N/A 10/23/2020   Procedure: ESOPHAGOGASTRODUODENOSCOPY (EGD) WITH PROPOFOL ;  Surgeon: Therisa Bi, MD;  Location: [facility] ENDOSCOPY;  Service: Gastroenterology;  Laterality: N/A;   ESOPHAGOGASTRODUODENOSCOPY (EGD) WITH PROPOFOL  N/A 11/05/2023   Procedure: ESOPHAGOGASTRODUODENOSCOPY (EGD) WITH PROPOFOL ;  Surgeon: Therisa Bi, MD;  Location: [facility] ENDOSCOPY;  Service: Gastroenterology;  Laterality: N/A;   FLEXIBLE SIGMOIDOSCOPY N/A 02/15/2021   Procedure: FLEXIBLE SIGMOIDOSCOPY;  Surgeon: Therisa Bi, MD;  Location: [facility] ENDOSCOPY;  Service: Gastroenterology;  Laterality: N/A;   FOOT SURGERY     Dr. Loreda    LEEP     2006   PANNICULECTOMY N/A 04/24/2022   Procedure: INFRAUMBILICAL PANNICULECTOMY;  Surgeon: Lowery Estefana RAMAN, DO;  Location: [facility] OR;  Service: Plastics;  Laterality: N/A;   Right hand carpal tunnel release Right 10/2022   scar tissue repair  04/2023   scar tissue from having children and tummy tuck   TUBAL LIGATION      Family History  Problem Relation Age of Onset   Heart failure Mother    Hypertension  Mother    Early death Mother 57   Heart disease Mother 55       chf   Cancer Father 17       esophageal    Hypertension Father    Breast cancer Sister 87   Mental illness Sister    Heart failure Sister    Asthma Son    Cancer Paternal Aunt        brain  cancer   Thyroid  cancer Neg Hx     Allergies[1]     Latest Ref Rng & Units 05/21/2024   10:04 AM 05/20/2024   12:38 PM 02/24/2024   10:54 AM  CBC  WBC 4.0 - 10.5 K/uL 5.7  6.4  6.5   Hemoglobin 12.0 - 15.0 g/dL 87.7  87.9  86.5   Hematocrit 36.0 - 46.0 % 34.2  33.7  39.8   Platelets 150 - 400 K/uL 207  193  210.0       CMP     Component Value Date/Time   NA 138 06/15/2024 0939   NA 143 03/04/2022 1553   NA 138 09/17/2014 0549   K 3.7 06/15/2024 0939   K 4.0 09/17/2014 0549   CL 104 06/15/2024 0939   CL 103 09/17/2014 0549   CO2 26 06/15/2024 0939   CO2 28 09/17/2014 0549   GLUCOSE 96 06/15/2024 0939   GLUCOSE 105 (H) 09/17/2014 0549   BUN 12 06/15/2024 0939   BUN 8 03/04/2022 1553   BUN 9 09/17/2014 0549   CREATININE 1.23 (H) 06/15/2024 0939   CREATININE 1.01 05/19/2019 1511   CALCIUM 9.4 06/15/2024 0939   CALCIUM 9.2 09/17/2014 0549   PROT 6.7 05/20/2024 1238   PROT 6.6 03/04/2022 1553   PROT 7.4 04/04/2013 1451   ALBUMIN 3.6 05/20/2024 1238   ALBUMIN 4.3 03/04/2022 1553   ALBUMIN 3.4 04/04/2013 1451   AST 20 05/20/2024 1238   AST 26 04/04/2013 1451   ALT 18 05/20/2024 1238   ALT 34 04/04/2013 1451   ALKPHOS 89 05/20/2024 1238   ALKPHOS 88 04/04/2013 1451   BILITOT 0.2 05/20/2024 1238   BILITOT 0.2 03/04/2022 1553   BILITOT 0.5 04/04/2013 1451   GFR 49.30 (L) 06/15/2024 0939   EGFR 45 (L) 03/04/2022 1553   GFRNONAA 57 (L) 05/21/2024 1004   GFRNONAA >60 09/17/2014 0549   GFRNONAA >60 04/04/2013 1451     No results found.     Assessment & Plan:   1. Lymphedema (Primary) Lymphedema Chronic lower extremity lymphedema with mild edema and minimal response to current therapies. No systemic involvement or acute complications.  - Referred to lymphedema clinic for specialized management, including leg wrapping and manual lymphatic drainage education. - Advised continuation of current compression therapy and pneumatic pump use. - Provided education on the  chronic, incurable nature of lymphedema and maintenance focus. - Scheduled follow-up in one year, with instructions to contact the office if symptoms worsen or no contact from the lymphedema clinic within 6-8 weeks.  2. Arthritis of both knees This is likely the source of the pain that she is having that she describes in her her knee area.  3. Essential hypertension Continue antihypertensive medications as already ordered, these medications have been reviewed and there are no changes at this time.  4. Lumbosacral radiculitis Currently being treated by pain management and upcoming visit in the next week  Assessment and Plan Assessment & Plan      Medications Ordered Prior to Encounter[2]  There are  no Patient Instructions on file for this visit. Return for 1 year no studies GS/FB.   Orvin FORBES Daring, NP      [1]  Allergies Allergen Reactions   Hydrochlorothiazide  Rash and Swelling    hydrochlorothiazide    Keflex [Cephalexin] Swelling and Rash   Lisinopril Hives and Other (See Comments)    Headache  Low BP  Angioedema  Other Reaction(s): other  lisinopril   Losartan  Hives and Swelling    Angioedema  losartan    Penicillins Rash    Has patient had a PCN reaction causing immediate rash, facial/tongue/throat swelling, SOB or lightheadedness with hypotension: Yes Has patient had a PCN reaction causing severe rash involving mucus membranes or skin necrosis: No Has patient had a PCN reaction that required hospitalization: No Has patient had a PCN reaction occurring within the last 10 years: No If all of the above answers are NO, then may proceed with Cephalosporin use.    Bee Venom Swelling    Other Reaction(s): other  honey bee venom   Metformin      Other Reaction(s): other  metformin   [2]  Current Outpatient Medications on File Prior to Visit  Medication Sig Dispense Refill   acetaminophen  (TYLENOL ) 500 MG tablet Oral     albuterol  (VENTOLIN  HFA) 108 (90  Base) MCG/ACT inhaler Inhale 1-2 puffs into the lungs every 6 (six) hours as needed for wheezing or shortness of breath. 18 g 11   amLODipine  (NORVASC ) 5 MG tablet TAKE ONE TABLET BY MOUTH ONCE EVERY DAY 90 tablet 0   ARIPiprazole ER (ABILIFY MAINTENA) 300 MG SRER injection as directed Intramuscular every 21 days for bipolar disorder     benztropine  (COGENTIN ) 1 MG tablet Take 1 mg by mouth 2 (two) times daily.     Buprenorphine  HCl (BELBUCA ) 450 MCG FILM Place 1 Film (450 mcg total) inside cheek every 12 (twelve) hours as needed. 60 each 0   buPROPion (WELLBUTRIN XL) 300 MG 24 hr tablet Take 300 mg by mouth daily.     busPIRone (BUSPAR) 15 MG tablet Take 15 mg by mouth 2 (two) times daily.     diclofenac  (VOLTAREN ) 50 MG EC tablet Take 1 tablet twice a day by oral route.     EPINEPHrine  0.3 mg/0.3 mL IJ SOAJ injection Inject 0.3 mg into the muscle as needed for anaphylaxis. 2 each 3   famotidine  (PEPCID ) 40 MG tablet TAKE ONE TABLET BY MOUTH ONCE EVERY DAY 120 tablet 11   FLUoxetine  (PROZAC ) 40 MG capsule Take 40 mg by mouth daily. (Patient taking differently: Take 40 mg by mouth daily. Takes 60 mg daily)     fluticasone  (FLONASE ) 50 MCG/ACT nasal spray INSTILL 2 SPRAYS IN EACH NOSTRIL ONCE DAILY 16 g 10   furosemide  (LASIX ) 20 MG tablet Take 1 tablet (20 mg total) by mouth daily. 30 tablet 3   hydrOXYzine  (VISTARIL ) 25 MG capsule Take 25 mg by mouth daily as needed.     Insulin  Pen Needle (NOVOFINE) 30G X 8 MM MISC Use with victoza  daily 1.8 mg 90 each 3   metoprolol  succinate (TOPROL -XL) 25 MG 24 hr tablet Take 1 tablet (25 mg total) by mouth daily. 90 tablet 3   mometasone (ELOCON) 0.1 % lotion USE 4-5 DROPS IN EACH EAR AT BEDTIME FOR 7 DAYS THEN ONCE WEEKLY THEREAFTER     Multiple Vitamins-Minerals (ALIVE MULTI-VITAMIN PO) Take by mouth daily in the afternoon.     naloxone (NARCAN) nasal spray 4 mg/0.1 mL SMARTSIG:1 Both Nares  Daily     nicotine  (NICODERM CQ  - DOSED IN MG/24 HOURS) 14  mg/24hr patch 1 patch to skin Transdermal Once a day     nicotine  polacrilex (NICORETTE ) 2 MG gum 1 piece chew for 30 minutes as needed Mouth/Throat every 8 hrs     omeprazole  (PRILOSEC) 40 MG capsule TAKE 1 CAPSULE BY MOUTH ONCE EVERY DAY 120 capsule 11   ondansetron  (ZOFRAN -ODT) 4 MG disintegrating tablet Take 1 tablet (4 mg total) by mouth every 8 (eight) hours as needed for nausea or vomiting. 20 tablet 0   predniSONE  (DELTASONE ) 20 MG tablet Take 3 tablets (60 mg total) by mouth daily with breakfast for 3 days, THEN 2 tablets (40 mg total) daily with breakfast for 3 days, THEN 1 tablet (20 mg total) daily with breakfast for 3 days. 18 tablet 0   tirzepatide  (ZEPBOUND ) 2.5 MG/0.5ML Pen Inject 2.5 mg into the skin once a week. 2 mL 1   topiramate  (TOPAMAX ) 25 MG tablet Take 25 mg by mouth daily.     No current facility-administered medications on file prior to visit.   "

## 2024-10-24 ENCOUNTER — Ambulatory Visit: Admitting: Student in an Organized Health Care Education/Training Program

## 2024-10-24 ENCOUNTER — Ambulatory Visit (INDEPENDENT_AMBULATORY_CARE_PROVIDER_SITE_OTHER): Admitting: Vascular Surgery

## 2024-10-26 ENCOUNTER — Ambulatory Visit: Admitting: Student in an Organized Health Care Education/Training Program

## 2024-10-28 ENCOUNTER — Other Ambulatory Visit: Payer: Self-pay | Admitting: Family

## 2024-10-28 ENCOUNTER — Other Ambulatory Visit: Payer: Self-pay | Admitting: Physical Medicine and Rehabilitation

## 2024-10-28 DIAGNOSIS — J309 Allergic rhinitis, unspecified: Secondary | ICD-10-CM

## 2024-10-28 DIAGNOSIS — I10 Essential (primary) hypertension: Secondary | ICD-10-CM

## 2024-10-28 NOTE — Telephone Encounter (Signed)
 Requested Prescriptions   Pending Prescriptions Disp Refills   topiramate  (TOPAMAX ) 25 MG tablet [Pharmacy Med Name: TOPIRAMATE  25 MG TABS 25 Tablet] 90 tablet 10    Sig: TAKE ONE TABLET BY MOUTH AT BEDTIME     Date of patient request: 10/28/2024 Last office visit: 12/14/2023 Upcoming visit: Visit date not found Date of last refill: Not sent by you Last refill amount: N/A  I do see mention of increasing the topamax  in your last note with this patient but it doesn't look like it's been sent in by you. Please advise

## 2024-10-31 ENCOUNTER — Ambulatory Visit: Admitting: Student in an Organized Health Care Education/Training Program

## 2024-11-07 ENCOUNTER — Ambulatory Visit: Admitting: Student in an Organized Health Care Education/Training Program

## 2024-11-09 ENCOUNTER — Ambulatory Visit: Admitting: Nurse Practitioner

## 2024-11-09 DIAGNOSIS — G8929 Other chronic pain: Secondary | ICD-10-CM

## 2024-11-09 DIAGNOSIS — M47816 Spondylosis without myelopathy or radiculopathy, lumbar region: Secondary | ICD-10-CM

## 2024-11-09 DIAGNOSIS — M170 Bilateral primary osteoarthritis of knee: Secondary | ICD-10-CM

## 2024-11-09 DIAGNOSIS — Z79899 Other long term (current) drug therapy: Secondary | ICD-10-CM

## 2024-11-09 DIAGNOSIS — Z91199 Patient's noncompliance with other medical treatment and regimen due to unspecified reason: Secondary | ICD-10-CM

## 2024-11-09 DIAGNOSIS — G894 Chronic pain syndrome: Secondary | ICD-10-CM

## 2024-11-10 ENCOUNTER — Encounter: Payer: Self-pay | Admitting: Nurse Practitioner

## 2024-11-10 ENCOUNTER — Ambulatory Visit: Admitting: Nurse Practitioner

## 2024-11-10 VITALS — BP 133/74 | HR 92 | Temp 96.9°F | Resp 18 | Ht 63.0 in | Wt 260.0 lb

## 2024-11-10 DIAGNOSIS — M25551 Pain in right hip: Secondary | ICD-10-CM

## 2024-11-10 DIAGNOSIS — G894 Chronic pain syndrome: Secondary | ICD-10-CM

## 2024-11-10 DIAGNOSIS — M170 Bilateral primary osteoarthritis of knee: Secondary | ICD-10-CM

## 2024-11-10 DIAGNOSIS — Z79899 Other long term (current) drug therapy: Secondary | ICD-10-CM

## 2024-11-10 DIAGNOSIS — M47816 Spondylosis without myelopathy or radiculopathy, lumbar region: Secondary | ICD-10-CM

## 2024-11-10 DIAGNOSIS — G8929 Other chronic pain: Secondary | ICD-10-CM

## 2024-11-10 MED ORDER — BELBUCA 450 MCG BU FILM: 1.0000 | ORAL_FILM | Freq: Two times a day (BID) | BUCCAL | 0 refills | Status: AC | PRN

## 2024-11-10 NOTE — Progress Notes (Signed)
 PROVIDER NOTE: Interpretation of information contained herein should be left to medically-trained personnel. Specific patient instructions are provided elsewhere under Patient Instructions section of medical record. This document was created in part using AI and STT-dictation technology, any transcriptional errors that may result from this process are unintentional.  Patient: Eleanor LOISE Gavel  Service: E/M   PCP: Sampson Ethridge LABOR, MD  DOB: 23-Apr-1966  DOS: 11/10/2024  Provider: Emmy MARLA Blanch, NP  MRN: [id number]  Delivery: Face-to-face  Specialty: Interventional Pain Management  Type: Established Patient  Setting: Ambulatory outpatient facility  Specialty designation: 09  Referring Prov.: Entzminger, Ethridge LABOR, MD  Location: Outpatient office facility       History of present illness (HPI) Ms. JOELYN LOVER, a 59 y.o. year old female, is here today because of her Bilateral primary osteoarthritis of knee [M17.0]. Ms. Goetzke primary complain today is Leg Pain (Right leg, all the way to feet and toes ) and LBP.  Pertinent problems: Ms. Bradby has Essential hypertension; Bilateral carpal tunnel syndrome; Morbid obesity with BMI of 40.0-44.9, adult (HCC); OSA on CPAP; Chronic pain; Mood disorder (HCC)--Bipolar; Vitamin D  deficiency; Mitral valve insufficiency; Gastroesophageal reflux disease; Right knee pain; Bilateral primary osteoarthritis of knee; Lumbosacral radiculitis; Insomnia; Stage 3a chronic kidney disease (HCC); Morbid obesity (HCC) BMI=42.9; Medication management; Lumbar facet arthropathy; Chronic radicular lumbar pain; and Right hip pain on their pertinent problem list.  Pain Assessment: Severity of Chronic pain is reported as a 9 /10. Location: Leg Right/All down the whole right leg. Onset: More than a month ago. Quality: Aching. Timing: Constant. Modifying factor(s): Sitting Down. Vitals:  height is 5' 3 (1.6 m) and weight is 260 lb (117.9 kg). Her temporal temperature is 96.9 F  (36.1 C) (abnormal). Her blood pressure is 133/74 and her pulse is 92. Her respiration is 18 and oxygen saturation is 100%.  BMI: Estimated body mass index is 46.06 kg/m as calculated from the following:   Height as of this encounter: 5' 3 (1.6 m).   Weight as of this encounter: 260 lb (117.9 kg).  Last encounter: 11/09/2024. Last procedure: Visit date not found.  Reason for encounter: evaluation for possible interventional PM therapy/treatment and medication management. No change in medical history since last visit.  Patient's pain is at baseline.  Patient continues multimodal pain regimen as prescribed.  States that it provides pain relief and improvement in functional status.   Discussed the use of AI scribe software for clinical note transcription with the patient, who gave verbal consent to proceed.  History of Present Illness   DARRION MACAULAY is a 59 year old female who presents for pain management.  She experiences persistent right knee pain, rated as 10/10 in severity, described as a pulling and burning sensation radiating down her right leg. Numbness and tingling are present, but there is no weakness other than weakness at the R knee with weight bearing. The pain is constant and worsens with weight-bearing activities. A genicular nerve block in August provided significant relief, but the pain has since returned.  She also has lower back pain, described as 9/10 in severity during episodes, located across her lower back and radiating down her right leg to the ankle. The pain is exacerbated by physical activity and alleviated by sitting down. She takes Belbuca  50 mcg film every twelve hours, but it is not providing adequate relief. Drowsiness is a side effect, but there are no other adverse effects, constipation, or drug reactions.  Her past medical history  includes a recent MRI from January 2025 showed Progressive facet hypertrophy at L4-5 with new mild multifactorial spinal stenosis.  Progressive disc degeneration at L5-S1 with resulting mild multifactorial spinal stenosis and mild to moderate foraminal narrowing bilaterally. Of note, patient is currently not on any blood thinning medication.  Pharmacotherapy Assessment   Belbuca  450 mcg film every 12 hours as needed for pain. Monitoring:  PMP: PDMP reviewed during this encounter.       Pharmacotherapy: No side-effects or adverse reactions reported. Compliance: No problems identified. Effectiveness: Clinically acceptable.  Erlene Doyal SAUNDERS, [place]  11/10/2024  8:44 AM  Sign when Signing Visit Nursing Pain Medication Assessment:  Safety precautions to be maintained throughout the outpatient stay will include: orient to surroundings, keep bed in low position, maintain call bell within reach at all times, provide assistance with transfer out of bed and ambulation.  Medication Inspection Compliance: Pill count conducted under aseptic conditions, in front of the patient. Neither the pills nor the bottle was removed from the patient's sight at any time. Once count was completed pills were immediately returned to the patient in their original bottle.  Medication: Belbuca  Pill/Patch Count: 26 of 60 pills/patches remain Pill/Patch Appearance: Markings consistent with prescribed medication Bottle Appearance: Standard pharmacy container. Clearly labeled. Filled Date: Unable to read Box  Last Medication intake:  Yesterday    UDS:  Summary  Date Value Ref Range Status  05/17/2024 FINAL  Final    Comment:    ==================================================================== Compliance Drug Analysis, Ur ==================================================================== Test                             Result       Flag       Units  Drug Present and Declared for Prescription Verification   Bupropion                      PRESENT      EXPECTED   Hydroxybupropion               PRESENT      EXPECTED    Hydroxybupropion is an  expected metabolite of bupropion.    Fluoxetine                      PRESENT      EXPECTED   Norfluoxetine                  PRESENT      EXPECTED    Norfluoxetine is an expected metabolite of fluoxetine .    Fluphenazine                    PRESENT      EXPECTED   Aripiprazole                   PRESENT      EXPECTED   Benztropine                     PRESENT      EXPECTED   Metoprolol                      PRESENT      EXPECTED  Drug Absent but Declared for Prescription Verification   Buprenorphine                   Not Detected UNEXPECTED ng/mg creat    Low dose  buprenorphine  is not always detected even when used as    directed.    Topiramate                      Not Detected UNEXPECTED   Duloxetine                      Not Detected UNEXPECTED   Hydroxyzine                     Not Detected UNEXPECTED   Lidocaine                       Not Detected UNEXPECTED    Lidocaine , as indicated in the declared medication list, is not    always detected even when used as directed.  ==================================================================== Test                      Result    Flag   Units      Ref Range   Creatinine              51               mg/dL      >=79 ==================================================================== Declared Medications:  The flagging and interpretation on this report are based on the  following declared medications.  Unexpected results may arise from  inaccuracies in the declared medications.   **Note: The testing scope of this panel includes these medications:   Aripiprazole  Benztropine   Bupropion (Wellbutrin XL)  Duloxetine  (Cymbalta )  Fluoxetine  (Prozac )  Fluphenazine   Hydroxyzine  (Atarax )  Metoprolol  (Toprol )  Topiramate  (Topamax )   **Note: The testing scope of this panel does not include small to  moderate amounts of these reported medications:   Buprenorphine   Topical Lidocaine  (Lidoderm )   **Note: The testing scope of this panel does not  include the  following reported medications:   Albuterol  (Ventolin  HFA)  Amlodipine   Buspirone (Buspar)  Epinephrine  (EpiPen )  Famotidine  (Pepcid )  Fluticasone  (Flonase )  Insulin   Omalizumab  (Xolair )  Omeprazole  (Prilosec)  Ondansetron  (Zofran ) ==================================================================== For clinical consultation, please call [phone]. ====================================================================     No results found for: CBDTHCR No results found for: D8THCCBX No results found for: D9THCCBX  ROS  Constitutional: Denies any fever or chills Gastrointestinal: No reported hemesis, hematochezia, nausea, vomiting, diarrhea, constipation, or acute GI distress. Bowel movements twice per day.  Musculoskeletal:  B/L knee pain R>L associated with weakness from weight bearing, LBP Neurological: No reported episodes of acute onset apraxia, aphasia, dysarthria, agnosia, amnesia, paralysis, loss of coordination, or loss of consciousness  Medication Review  ARIPiprazole ER, Buprenorphine  HCl, EPINEPHrine , FLUoxetine , Insulin  Pen Needle, Multiple Vitamins-Minerals, acetaminophen , albuterol , amLODipine , benztropine , buPROPion, busPIRone, diclofenac , famotidine , fluPHENAZine , fluticasone , furosemide , hydrOXYzine , metoprolol  succinate, mirtazapine, mometasone, naloxone, nicotine , nicotine  polacrilex, omeprazole , ondansetron , tirzepatide , and topiramate   History Review  Allergy: Ms. Naff is allergic to hydrochlorothiazide , keflex [cephalexin], lisinopril, losartan , penicillins, bee venom, and metformin . Drug: Ms. Beringer  reports that she does not currently use drugs after having used the following drugs: Marijuana. Alcohol :  reports that she does not currently use alcohol . Tobacco:  reports that she has quit smoking. Her smoking use included e-cigarettes. Her smokeless tobacco use includes chew. Social: Ms. Coulson  reports that she has quit smoking. Her  smoking use included e-cigarettes. Her smokeless tobacco use includes chew. She reports that she does not currently use alcohol . She reports that she does not currently use drugs  after having used the following drugs: Marijuana. 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), Arthritis of knee, right, Arthritis of right knee (10/25/2020), Asthma, Bacterial vaginosis (10/20/2019), Bipolar disorder (HCC), BMI 37.0-37.9, adult (03/07/2022), BMI 40.0-44.9, adult (HCC) (06/17/2022), Bronchiectasis without complication (HCC) (04/04/2022), Chronic low back pain, Chronic midline low back pain (12/17/2021), Class 3 severe obesity due to excess calories in adult [facility]) (04/09/2022), CTS (carpal tunnel syndrome), Depression, Diabetes mellitus without complication (HCC), Early satiety (02/11/2022), Elevated liver enzymes (08/17/2020), Encounter for screening colonoscopy, Encounter for screening for COVID-19 (10/10/2022), Frequent PVCs (09/24/2016), Gallstones, GERD (gastroesophageal reflux disease), History of prediabetes, Hives (02/23/2019), Hypertension, Hypokalemia (12/13/2021), Incontinence of feces (02/12/2021), Ingrown right big toenail (04/20/2020), Iron deficiency anemia (12/13/2021), Laceration of right foot, Lymphedema (08/24/2022), Obesity (BMI 30-39.9) (09/10/2021), Onychomycosis, OSA on CPAP, Osteoarthritis of knee (12/17/2021), Overactive bladder, Pain and swelling of right knee (04/20/2020), PVC's (premature ventricular contractions), PVC's (premature ventricular contractions), S/P laparoscopic cholecystectomy April 2019 (01/12/2018), Seizures (HCC), Tachycardia (10/10/2022), Urticaria (02/23/2019), and Weight loss (02/11/2022). Surgical: Ms. Sinning  has a past surgical history that includes Abdominal hysterectomy (07/08/2011);  Cholecystectomy (N/A, 01/12/2018); Foot surgery; Cesarean section; Dilation and curettage of uterus; LEEP; Tubal ligation; Carpal tunnel release; Colonoscopy with propofol  (N/A, 04/15/2019); Esophagogastroduodenoscopy (egd) with propofol  (N/A, 10/23/2020); Flexible sigmoidoscopy (N/A, 02/15/2021); Panniculectomy (N/A, 04/24/2022); Right hand carpal tunnel release (Right, 10/2022); scar tissue repair (04/2023); and Esophagogastroduodenoscopy (egd) with propofol  (N/A, 11/05/2023). Family: family history includes Asthma in her son; Breast cancer (age of onset: 76) in her sister; Cancer in her paternal aunt; Cancer (age of onset: 22) in her father; Early death (age of onset: 45) in her mother; Heart disease (age of onset: 36) in her mother; Heart failure in her mother and sister; Hypertension in her father and mother; Mental illness in her sister.  Laboratory Chemistry Profile   Renal Lab Results  Component Value Date   BUN 12 06/15/2024   CREATININE 1.23 (H) 06/15/2024   LABCREA 56 02/24/2019   BCR 6 (L) 03/04/2022   GFR 49.30 (L) 06/15/2024   GFRAA >60 01/06/2018   GFRNONAA 57 (L) 05/21/2024    Hepatic Lab Results  Component Value Date   AST 20 05/20/2024   ALT 18 05/20/2024   ALBUMIN 3.6 05/20/2024   ALKPHOS 89 05/20/2024    Electrolytes Lab Results  Component Value Date   NA 138 06/15/2024   K 3.7 06/15/2024   CL 104 06/15/2024   CALCIUM 9.4 06/15/2024    Bone Lab Results  Component Value Date   VD25OH 29.74 (L) 09/22/2023    Inflammation (CRP: Acute Phase) (ESR: Chronic Phase) Lab Results  Component Value Date   CRP <1.0 02/24/2019   ESRSEDRATE 22 02/24/2019         Note: Above Lab results reviewed.  Recent Imaging Review  DG PAIN CLINIC C-ARM 1-60 MIN NO REPORT Fluoro was used, but no Radiologist interpretation will be provided.  Please refer to NOTES tab for provider progress note. Note: Reviewed        Narrative & Impression  CLINICAL DATA:  Chronic low back  pain. Patient fell in 2017 and has persistent low back and buttock pain with right leg pain. No previous relevant surgery.   EXAM: MRI LUMBAR SPINE WITHOUT CONTRAST   TECHNIQUE: Multiplanar, multisequence MR imaging of the lumbar spine was performed. No intravenous contrast was administered.  COMPARISON:  Lumbar MRI 02/28/2021.  Radiographs 02/07/2015.   FINDINGS: Segmentation: Conventional anatomy assumed, with the last open disc space designated L5-S1.Concordant with prior imaging.   Alignment:  Minimal degenerative anterolisthesis at L4-5.   Vertebrae: No worrisome osseous lesion, acute fracture or pars defect. The visualized sacroiliac joints appear unremarkable.   Conus medullaris: Extends to the T12-L1 level. The conus and cauda equina appear normal. There is mildly progressive epidural fat in the lower lumbar spinal canal.   Paraspinal and other soft tissues: No significant paraspinal findings.   Disc levels:   Sagittal images demonstrate no significant disc space findings within the visualized lower thoracic spine.   L1-2: Preserved disc height with mild disc bulging. No spinal stenosis or nerve root encroachment.   L2-3: Mildly progressive loss of disc height with progressive annular disc bulging. Mild facet and ligamentous hypertrophy. No significant spinal stenosis or nerve root encroachment.   L3-4: Preserved disc height with mild disc bulging. Mildly progressive bilateral facet hypertrophy. No spinal stenosis or nerve root encroachment.   L4-5: Preserved disc height with mild disc bulging. Progressive moderate facet hypertrophy with new mild multifactorial spinal stenosis. Stable minimal foraminal narrowing.   L5-S1: Progressive loss of disc height with annular disc bulging and a broad-based central disc protrusion. Mild bilateral facet hypertrophy. Combined with increased epidural fat, there is resulting mild multifactorial spinal stenosis with mild  to moderate foraminal narrowing bilaterally.   IMPRESSION: 1. Mildly progressive spondylosis at L2-3 and L3-4 without resulting spinal stenosis or nerve root encroachment. 2. Progressive facet hypertrophy at L4-5 with new mild multifactorial spinal stenosis. 3. Progressive disc degeneration at L5-S1 with resulting mild multifactorial spinal stenosis and mild to moderate foraminal narrowing bilaterally. 4. No acute findings.  Mildly progressive lower lumbar epidural fat.     Electronically Signed   By: Elsie Perone M.D.   On: 11/08/2023 12:35    Physical Exam  Vitals: BP 133/74 (BP Location: Right Arm, Patient Position: Sitting, Cuff Size: Large)   Pulse 92   Temp (!) 96.9 F (36.1 C) (Temporal)   Resp 18   Ht 5' 3 (1.6 m)   Wt 260 lb (117.9 kg)   SpO2 100%   BMI 46.06 kg/m  BMI: Estimated body mass index is 46.06 kg/m as calculated from the following:   Height as of this encounter: 5' 3 (1.6 m).   Weight as of this encounter: 260 lb (117.9 kg). Ideal: Ideal body weight: 52.4 kg (115 lb 8.3 oz) Adjusted ideal body weight: 78.6 kg (173 lb 5 oz) General appearance: Well nourished, well developed, and well hydrated. In no apparent acute distress Mental status: Alert, oriented x 3 (person, place, & time)       Respiratory: No evidence of acute respiratory distress Eyes: PERLA GI: Deferred at this time no symptoms reported and no constipation.  Musculoskeletal: +LBP (facet mediated pain), patient unable to bend forward for spinal exam Bilateral knee pain Negative anterior drawer tests B/L no effusion, redness, warmth, or swelling noted B/L. Palpable clicks from R knee with ROM. Lumbar Exam  Skin & Axial Inspection: No masses, redness, or swelling Alignment: Symmetrical Functional ROM: Pain restricted ROM affecting both sides Stability: No instability detected Muscle Tone/Strength: Functionally intact. No obvious neuro-muscular anomalies detected. Sensory  (Neurological): Musculoskeletal pain pattern Palpation: No palpable anomalies       Provocative Tests: Hyperextension/rotation test: (+) bilaterally for facet joint pain. Lumbar quadrant test (Kemp's test): (+) bilaterally for facet joint pain. Lateral bending test: (+)  ipsilateral radicular pain, bilaterally. Positive for bilateral foraminal stenosis. *(Flexion, ABduction and External Rotation)  Gait & Posture Assessment  Ambulation: Patient ambulates using a cane Gait: Antalgic gait (limping) Posture: Difficulty with positional changes  Lower Extremity Exam    Side: Right lower extremity  Side: Left lower extremity  Stability: No instability observed          Stability: No instability observed          Skin & Extremity Inspection: Skin color, temperature, and hair growth are WNL. No peripheral edema or cyanosis. No masses, redness, swelling, asymmetry, or associated skin lesions. No contractures.  Skin & Extremity Inspection: Skin color, temperature, and hair growth are WNL. No peripheral edema or cyanosis. No masses, redness, swelling, asymmetry, or associated skin lesions. No contractures.  Functional ROM: Unrestricted ROM for all joints of the lower extremity SLR WNL  Functional ROM: Unrestricted ROM for all joints of the lower extremity SLR WNL  Muscle Tone/Strength: Give-away weakness  Muscle Tone/Strength: Functionally intact. No obvious neuro-muscular anomalies detected.  Sensory (Neurological): Musculoskeletal pain pattern        Sensory (Neurological): Musculoskeletal pain pattern        DTR: Patellar: deferred today Achilles: deferred today Plantar: deferred today  DTR: Patellar: deferred today Achilles: deferred today Plantar: deferred today  Palpation: No palpable anomalies  Palpation: No palpable anomalies   Assessment   Diagnosis Status  1. Bilateral primary osteoarthritis of knee   2. Lumbar facet arthropathy   3. Lumbar spondylosis   4. Medication management    5. Chronic pain syndrome   6. Bilateral chronic knee pain   7. Chronic radicular lumbar pain   8. Right hip pain    Flared-up Flared-up Flared-up   Updated Problems: Problem  Bronchiolectasis (Hcc)  Bipolar 1 Disorder, Depressed, Full Remission   I bipolar depressed I been on it every  sentence. Jul 18 1995   Chronic Schizophrenia (Hcc)   Schizophrenia, unspecified; Src Problem: Schizophrenia W/U Status: confirmed Clinical Status: active   Abnormal Urine  Anemia in Chronic Kidney Disease  Chronic Bipolar Disorder (Hcc)   Bipolar disorder, unspecified; Src Problem: Bipolar disorder W/U Status: confirmed Clinical Status: active     Plan of Care  Problem-specific:  Assessment and Plan    Bilateral knee osteoarthritis with chronic pain Chronic bilateral knee pain, 10/10 severity, exacerbated by weight-bearing. Previous genicular radiofrequency ablation provided 50% improvement. - Schedule genicular radiofrequency ablation for end of February  - Advised on proper cane use for support.  Lumbar spondylosis with radiculopathy and spinal stenosis Chronic low back pain with radiculopathy, 9/10 severity, exacerbated by activity. MRI shows facet hypertrophy at L4-L5, disc degeneration at L4-L5 and S1, with multifactorial spinal stenosis. - Schedule lumbar facet block in two weeks. - Advised on the need for a driver post-procedure.  Chronic pain management with long-term opioid therapy Belbuca  50 mcg film every 12 hours, reports reduced efficacy and drowsiness. - Continue Belbuca  50 mcg film every 12 hours for one more month. - Advised caution with activities requiring concentration due to drowsiness. Patient's pain provide minimal pain relief with Belbuca , will continue on current medication regimen.  Prescribing drug monitoring (PMP) reviewed, findings consistent with the use of prescribed medication and no evidence of narcotic misuse or abuse.  Urine drug screening (UDS) up to date.   No side effects or any adverse reaction reported to medication.  Schedule follow-up in 1 month for medication management.   Plan: (Clinic): (B/L) Genicular RFA # 2  with (Sedation) Dr. Marcelino      Ms. Eleanor LOISE Gavel has a current medication list which includes the following long-term medication(s): albuterol , amlodipine , bupropion, famotidine , fluoxetine , fluoxetine , fluticasone , furosemide , metoprolol  succinate, mirtazapine, omeprazole , and topiramate .  Pharmacotherapy (Medications Ordered): Meds ordered this encounter  Medications   Buprenorphine  HCl (BELBUCA ) 450 MCG FILM    Sig: Place 1 Film (450 mcg total) inside cheek every 12 (twelve) hours as needed.    Dispense:  60 each    Refill:  0   Orders:  Orders Placed This Encounter  Procedures   Radiofrequency,Genicular    Standing Status:   Future    Expiration Date:   02/07/2025    Scheduling Instructions:     Procedure: Genicular nerve(s) Radiofrequency Ablation (RFA)     Side(s): Bilateral Knee     Level(s): Superior-Lateral, Superior-Medial, and Inferior-Medial Genicular Nerve(s)     Sedation: Patient's choice.     Scheduling Timeframe: As soon as pre-approved    Where will this procedure be performed?:   [facility]   LUMBAR FACET(MEDIAL BRANCH NERVE BLOCK) MBNB    Diagnosis: Lumbar Facet Syndrome (M47.816); Lumbosacral Facet Syndrome (M47.817); Lumbar Facet Joint Pain (M54.59) Medical Necessity Statement: 1.Severe chronic axial low back pain causing functional impairment documented by ongoing pain scale assessments. 2.Pain present for longer than 3 months (Chronic) documented to have failed noninvasive conservative therapies. 3.Absence of untreated radiculopathy. 4.There is no radiological evidence of untreated fractures, tumor, infection, or deformity.  Physical Examination Findings: Positive Kemp Maneuver: (Y)  Positive Lumbar Hyperextension-Rotation provocative test: (Y)    Standing Status:   Future     Expiration Date:   02/07/2025    Scheduling Instructions:     Procedure: Lumbar facet Block     Type: Medial Branch Block     Side: Bilateral     Purpose: Diagnostic/Therapeutic     Level(s): L4-5 and L5-S1 Facets (L4, L5, and S1 Medial Branch)     Sedation: With Sedation.     Timeframe: As soon as schedule allows.    Where will this procedure be performed?:   [facility]       Return in about 2 weeks (around 11/24/2024) for [facility]): (B/L) L-FCT # 1 and Genicular RFA # 2 with (Sedation) Dr. Marcelino .    Recent Visits Date Type Provider Dept  11/09/24 Office Visit Acsa Estey K, NP [facility]  10/12/24 Office Visit Kavita Bartl K, NP [facility]  08/22/24 Procedure visit Marcelino Nurse, MD [facility]  08/16/24 Office Visit Arilla Hice K, NP [facility]  Showing recent visits within past 90 days and meeting all other requirements Today's Visits Date Type Provider Dept  11/10/24 Office Visit Eathan Groman K, NP [facility]  Showing today's visits and meeting all other requirements Future Appointments Date Type Provider Dept  11/16/24 Appointment Marcelino Nurse, MD [facility]  Showing future appointments within next 90 days and meeting all other requirements  I discussed the assessment and treatment plan with the patient. The patient was provided an opportunity to ask questions and all were answered. The patient agreed with the plan and demonstrated an understanding of the instructions.  Patient advised to call back or seek an in-person evaluation if the symptoms or condition worsens.  I personally spent a total of 30 minutes in the care of the patient today including preparing to see the patient, getting/reviewing separately obtained history, performing a medically appropriate exam/evaluation, counseling and  educating, placing orders, referring and communicating with other health care professionals, documenting  clinical information in the EHR, independently interpreting results, communicating results, and coordinating care.  Note by: Natia Fahmy K Waunita Sandstrom, NP (TTS and AI technology used. I apologize for any typographical errors that were not detected and corrected.) Date: 11/10/2024; Time: 8:58 AM

## 2024-11-10 NOTE — Progress Notes (Signed)
 Nursing Pain Medication Assessment:  Safety precautions to be maintained throughout the outpatient stay will include: orient to surroundings, keep bed in low position, maintain call bell within reach at all times, provide assistance with transfer out of bed and ambulation.  Medication Inspection Compliance: Pill count conducted under aseptic conditions, in front of the patient. Neither the pills nor the bottle was removed from the patient's sight at any time. Once count was completed pills were immediately returned to the patient in their original bottle.  Medication: Bel[place]  Pill/Patch Count: 26 of 60 pills/patches remain Pill/Patch Appearance: Markings consistent with prescribed medication Bottle Appearance: Standard pharmacy container. Clearly labeled. Filled Date: Unable to read Box  Last Medication intake:  Yesterday

## 2024-11-10 NOTE — Patient Instructions (Signed)
 Radiofrequency Ablation Radiofrequency ablation is a procedure that is performed to relieve pain. The procedure is often used for back, neck, or arm pain. Radiofrequency ablation involves the use of a machine that creates radio waves to make heat. During the procedure, the heat is applied to the nerve that carries the pain signal. The heat damages the nerve and interferes with the pain signal. Pain relief usually starts about 2 weeks after the procedure and lasts for 6 months to 1 year. Tell a health care provider about: Any allergies you have. All medicines you are taking, including vitamins, herbs, eye drops, creams, and over-the-counter medicines. Any problems you or family members have had with anesthetic medicines. Any bleeding problems you have. Any surgeries you have had. Any medical conditions you have. Whether you are pregnant or may be pregnant. What are the risks? Generally, this is a safe procedure. However, problems may occur, including: Pain or soreness at the injection site. Allergic reaction to medicines given during the procedure. Bleeding. Infection at the injection site. Damage to nerves or blood vessels. What happens before the procedure? When to stop eating and drinking Follow instructions from your health care provider about what you may eat and drink before your procedure. These may include: 8 hours before the procedure Stop eating most foods. Do not eat meat, fried foods, or fatty foods. Eat only light foods, such as toast or crackers. All liquids are okay except energy drinks and alcohol. 6 hours before the procedure Stop eating. Drink only clear liquids, such as water, clear fruit juice, black coffee, plain tea, and sports drinks. Do not drink energy drinks or alcohol. 2 hours before the procedure Stop drinking all liquids. You may be allowed to take medicine with small sips of water. If you do not follow your health care provider's instructions, your  procedure may be delayed or canceled. Medicines Ask your health care provider about: Changing or stopping your regular medicines. This is especially important if you are taking diabetes medicines or blood thinners. Taking medicines such as aspirin and ibuprofen. These medicines can thin your blood. Do not take these medicines unless your health care provider tells you to take them. Taking over-the-counter medicines, vitamins, herbs, and supplements. General instructions Ask your health care provider what steps will be taken to help prevent infection. These steps may include: Removing hair at the procedure site. Washing skin with a germ-killing soap. Taking antibiotic medicine. If you will be going home right after the procedure, plan to have a responsible adult: Take you home from the hospital or clinic. You will not be allowed to drive. Care for you for the time you are told. What happens during the procedure?  You will be awake during the procedure. You will need to be able to talk with the health care provider during the procedure. An IV will be inserted into one of your veins. You will be given one or more of the following: A medicine to help you relax (sedative). A medicine to numb the area (local anesthetic). Your health care provider will insert a radiofrequency needle into the area to be treated. This is done with the help of fluoroscopy. A wire that carries the radio waves (electrode) will be put through the radiofrequency needle. An electrical pulse will be sent through the electrode to verify the correct nerve that is causing your pain. You will feel a tingling sensation, and you may have muscle twitching. The tissue around the needle tip will be heated by an  electric current that comes from the radiofrequency machine. This will numb the nerves. The needle will be removed. A bandage (dressing) will be put on the insertion area. The procedure may vary among health care providers  and hospitals. What happens after the procedure? Your blood pressure, heart rate, breathing rate, and blood oxygen level will be monitored until you leave the hospital or clinic. Return to your normal activities as told by your health care provider. Ask your health care provider what activities are safe for you. If you were given a sedative during the procedure, it can affect you for several hours. Do not drive or operate machinery until your health care provider says that it is safe. Summary Radiofrequency ablation is a procedure that is performed to relieve pain. The procedure is often used for back, neck, or arm pain. Radiofrequency ablation involves the use of a machine that creates radio waves to make heat. Plan to have a responsible adult take you home from the hospital or clinic. Do not drive or operate machinery until your health care provider says that it is safe. Return to your normal activities as told by your health care provider. Ask your health care provider what activities are safe for you. This information is not intended to replace advice given to you by your health care provider. Make sure you discuss any questions you have with your health care provider. Document Revised: 03/12/2021 Document Reviewed: 03/12/2021 [employer] Patient Education  2024 [employer].

## 2024-11-16 ENCOUNTER — Ambulatory Visit: Admitting: Student in an Organized Health Care Education/Training Program

## 2024-11-23 ENCOUNTER — Ambulatory Visit: Admitting: Student in an Organized Health Care Education/Training Program

## 2024-12-08 ENCOUNTER — Encounter: Admitting: Nurse Practitioner

## 2025-04-11 ENCOUNTER — Ambulatory Visit: Admitting: Plastic Surgery

## 2025-10-17 ENCOUNTER — Ambulatory Visit (INDEPENDENT_AMBULATORY_CARE_PROVIDER_SITE_OTHER): Admitting: Nurse Practitioner
